# Patient Record
Sex: Male | Born: 1955
Health system: Southern US, Community
[De-identification: ages and names within clinical notes are randomized; demographics above are authoritative.]

## PROBLEM LIST (undated history)

## (undated) DIAGNOSIS — E785 Hyperlipidemia, unspecified: Secondary | ICD-10-CM

## (undated) DIAGNOSIS — I6601 Occlusion and stenosis of right middle cerebral artery: Secondary | ICD-10-CM

## (undated) DIAGNOSIS — I639 Cerebral infarction, unspecified: Secondary | ICD-10-CM

## (undated) DIAGNOSIS — N189 Chronic kidney disease, unspecified: Secondary | ICD-10-CM

## (undated) DIAGNOSIS — I739 Peripheral vascular disease, unspecified: Secondary | ICD-10-CM

## (undated) DIAGNOSIS — Z87442 Personal history of urinary calculi: Secondary | ICD-10-CM

## (undated) DIAGNOSIS — K219 Gastro-esophageal reflux disease without esophagitis: Secondary | ICD-10-CM

## (undated) DIAGNOSIS — I713 Abdominal aortic aneurysm, ruptured, unspecified: Secondary | ICD-10-CM

## (undated) DIAGNOSIS — I712 Thoracic aortic aneurysm, without rupture, unspecified: Secondary | ICD-10-CM

## (undated) DIAGNOSIS — I10 Essential (primary) hypertension: Secondary | ICD-10-CM

## (undated) HISTORY — DX: Thoracic aortic aneurysm, without rupture, unspecified: I71.20

## (undated) HISTORY — DX: Abdominal aortic aneurysm, ruptured, unspecified: I71.30

## (undated) HISTORY — DX: Occlusion and stenosis of right middle cerebral artery: I66.01

## (undated) HISTORY — DX: Hyperlipidemia, unspecified: E78.5

## (undated) HISTORY — DX: Cerebral infarction, unspecified: I63.9

## (undated) HISTORY — DX: Chronic kidney disease, unspecified: N18.9

## (undated) HISTORY — DX: Peripheral vascular disease, unspecified: I73.9

## (undated) HISTORY — PX: CHOLECYSTECTOMY: SHX55

## (undated) HISTORY — DX: Abdominal aortic aneurysm, ruptured: I71.3

## (undated) HISTORY — DX: Thoracic aortic aneurysm, without rupture: I71.2

---

## 2005-03-04 ENCOUNTER — Emergency Department (HOSPITAL_COMMUNITY): Admission: EM | Admit: 2005-03-04 | Discharge: 2005-03-04 | Payer: Self-pay | Admitting: Emergency Medicine

## 2006-06-07 ENCOUNTER — Encounter: Admission: RE | Admit: 2006-06-07 | Discharge: 2006-06-07 | Payer: Self-pay | Admitting: Internal Medicine

## 2007-05-09 ENCOUNTER — Encounter: Admission: RE | Admit: 2007-05-09 | Discharge: 2007-05-09 | Payer: Self-pay | Admitting: Internal Medicine

## 2011-07-11 ENCOUNTER — Emergency Department (HOSPITAL_COMMUNITY)
Admission: EM | Admit: 2011-07-11 | Discharge: 2011-07-11 | Disposition: A | Discharge status: Home / Self Care | Payer: Commercial Managed Care - PPO | Attending: Emergency Medicine | Admitting: Emergency Medicine

## 2011-07-11 ENCOUNTER — Encounter (HOSPITAL_COMMUNITY): Payer: Self-pay

## 2011-07-11 DIAGNOSIS — I10 Essential (primary) hypertension: Secondary | ICD-10-CM | POA: Insufficient documentation

## 2011-07-11 DIAGNOSIS — H81399 Other peripheral vertigo, unspecified ear: Secondary | ICD-10-CM | POA: Insufficient documentation

## 2011-07-11 HISTORY — DX: Essential (primary) hypertension: I10

## 2011-07-11 MED ORDER — MECLIZINE HCL 25 MG PO TABS: 25.0000 mg | ORAL_TABLET | Freq: Three times a day (TID) | ORAL | Status: AC | PRN

## 2011-07-11 MED ORDER — MECLIZINE HCL 25 MG PO TABS
25.0000 mg | ORAL_TABLET | Freq: Once | ORAL | Status: AC
  Administered 2011-07-11: 25 mg via ORAL
  Filled 2011-07-11: qty 1

## 2011-07-11 NOTE — ED Provider Notes (Signed)
History     CSN: [id number]  Arrival date & time 07/11/11  0408   First MD Initiated Contact with Patient 07/11/11 [phone]      Chief Complaint  Patient presents with  . Dizziness    (Consider location/radiation/quality/duration/timing/severity/associated sxs/prior treatment) HPI This is a 56 year old Asian male with a history of vertigo. He was at work this morning and experienced the sudden onset of vertigo at about 2 AM. He describes the vertigo as a sensation of the room spinning. It was accompanied by nausea and vomiting. He states the symptoms were severe. They worsened by movement of his head. The symptoms have subsequently resolved he can now sit up and move his head without any difficulty. He denies chest pain.  Past Medical History  Diagnosis Date  . Hypertension     No past surgical history on file.  No family history on file.  History  Substance Use Topics  . Smoking status: Never Smoker   . Smokeless tobacco: Not on file  . Alcohol Use: No      Review of Systems  All other systems reviewed and are negative.    Allergies  Review of patient's allergies indicates no known allergies.  Home Medications   Current Outpatient Rx  Name Route Sig Dispense Refill  . ACETAMINOPHEN 500 MG PO TABS Oral Take 1,000 mg by mouth every 6 (six) hours as needed. For pain      BP 122/83  Pulse 80  Temp(Src) 97.6 F (36.4 C) (Oral)  Resp 18  SpO2 99%  Physical Exam General: Well-developed, well-nourished male in no acute distress; appearance consistent with age of record HENT: normocephalic, atraumatic Eyes: pupils equal round and reactive to light; extraocular muscles intact; arcus senilis bilaterally Neck: supple Heart: regular rate and rhythm Lungs: clear to auscultation bilaterally Abdomen: soft; nondistended; nontender; bowel sounds present Extremities: No deformity; full range of motion Neurologic: Awake, alert and oriented; motor function intact in all  extremities and symmetric; no facial droop;  Normal coordination; no nystagmus Skin: Warm and dry Psychiatric: Normal mood and affect    ED Course  Procedures (including critical care time)    MDM  EKG Interpretation:  Date & Time: 07/11/2011 4:29 AM  Rate: 71  Rhythm: normal sinus rhythm with PVCs  QRS Axis: normal  Intervals: normal  ST/T Wave abnormalities: normal  Conduction Disutrbances:right bundle branch block  Narrative Interpretation:   Old EKG Reviewed: none available             Hanley Seamen, MD 07/11/11 [phone]

## 2011-07-11 NOTE — Discharge Instructions (Signed)
Vertigo  Vertigo means you feel like you are moving when you are not. Vertigo can make you feel like things around you are moving when they are not. This problem often goes away on its own.   HOME CARE   · Follow your doctor's instructions.  · Avoid driving.  · Avoid using heavy machinery.  · Avoid doing any activity that could be dangerous if you have a vertigo attack.  · Tell your doctor if a medicine seems to cause your vertigo.  GET HELP RIGHT AWAY IF:   · Your medicines do not help or make you feel worse.  · You have trouble talking or walking.  · You feel weak or have trouble using your arms, hands, or legs.  · You have bad headaches.  · You keep feeling sick to your stomach (nauseous) or throwing up (vomiting).  · Your vision changes.  · A family member notices changes in your behavior.  · Your problems get worse.  MAKE SURE YOU:  · Understand these instructions.  · Will watch your condition.  · Will get help right away if you are not doing well or get worse.  Document Released: 12/21/2007 Document Revised: 03/02/2011 Document Reviewed: 09/29/2010  Exit[employer]® Patient Information ©2012 [employer].

## 2011-07-11 NOTE — ED Notes (Signed)
[phone] Mrs. [employer] pt works for a ride back after discharge.

## 2011-07-11 NOTE — ED Notes (Signed)
Pt complains of dizziness and vomiitng onset 2 am. Hx of the same last year.

## 2017-02-27 ENCOUNTER — Ambulatory Visit (HOSPITAL_COMMUNITY)
Admission: EM | Admit: 2017-02-27 | Discharge: 2017-02-27 | Disposition: A | Discharge status: Home / Self Care | Payer: Commercial Managed Care - PPO | Attending: Family Medicine | Admitting: Family Medicine

## 2017-02-27 ENCOUNTER — Encounter (HOSPITAL_COMMUNITY): Payer: Self-pay | Admitting: Family Medicine

## 2017-02-27 ENCOUNTER — Encounter (HOSPITAL_COMMUNITY): Payer: Self-pay

## 2017-02-27 ENCOUNTER — Emergency Department (HOSPITAL_BASED_OUTPATIENT_CLINIC_OR_DEPARTMENT_OTHER)
Admit: 2017-02-27 | Discharge: 2017-02-27 | Disposition: A | Discharge status: Home / Self Care | Payer: Self-pay | Attending: Family Medicine | Admitting: Family Medicine

## 2017-02-27 ENCOUNTER — Emergency Department (HOSPITAL_COMMUNITY)
Admission: EM | Admit: 2017-02-27 | Discharge: 2017-02-27 | Disposition: A | Discharge status: Home / Self Care | Payer: Self-pay | Attending: Emergency Medicine | Admitting: Emergency Medicine

## 2017-02-27 DIAGNOSIS — M79604 Pain in right leg: Secondary | ICD-10-CM

## 2017-02-27 DIAGNOSIS — M79609 Pain in unspecified limb: Secondary | ICD-10-CM

## 2017-02-27 DIAGNOSIS — I10 Essential (primary) hypertension: Secondary | ICD-10-CM | POA: Insufficient documentation

## 2017-02-27 DIAGNOSIS — M79661 Pain in right lower leg: Secondary | ICD-10-CM | POA: Insufficient documentation

## 2017-02-27 LAB — CBC
HCT: 44.2 % (ref 39.0–52.0)
Hemoglobin: 14.8 g/dL (ref 13.0–17.0)
MCH: 26.3 pg (ref 26.0–34.0)
MCHC: 33.5 g/dL (ref 30.0–36.0)
MCV: 78.5 fL (ref 78.0–100.0)
PLATELETS: 305 10*3/uL (ref 150–400)
RBC: 5.63 MIL/uL (ref 4.22–5.81)
RDW: 14.5 % (ref 11.5–15.5)
WBC: 10.7 10*3/uL — ABNORMAL HIGH (ref 4.0–10.5)

## 2017-02-27 LAB — BASIC METABOLIC PANEL
Anion gap: 7 (ref 5–15)
BUN: 16 mg/dL (ref 6–20)
CALCIUM: 8.8 mg/dL — AB (ref 8.9–10.3)
CO2: 26 mmol/L (ref 22–32)
CREATININE: 1.54 mg/dL — AB (ref 0.61–1.24)
Chloride: 104 mmol/L (ref 101–111)
GFR calc Af Amer: 54 mL/min — ABNORMAL LOW (ref >60)
GFR, EST NON AFRICAN AMERICAN: 46 mL/min — AB (ref >60)
Glucose, Bld: 97 mg/dL (ref 65–99)
Potassium: 4.5 mmol/L (ref 3.5–5.1)
SODIUM: 137 mmol/L (ref 135–145)

## 2017-02-27 MED ORDER — CYCLOBENZAPRINE HCL 10 MG PO TABS: 10.0000 mg | ORAL_TABLET | Freq: Two times a day (BID) | ORAL | 0 refills | Status: DC | PRN

## 2017-02-27 NOTE — ED Notes (Signed)
Pt sent to the ER for [place] of right leg.

## 2017-02-27 NOTE — Discharge Instructions (Signed)
We are asking you to go to [facility] Admitting to have a venous doppler exam to rule out a clot.

## 2017-02-27 NOTE — ED Provider Notes (Signed)
  [place]   [id number] 02/27/17 Arrival Time: 1003   SUBJECTIVE:  Thomas [name] is a 61 y.o. male who presents to the urgent care with complaint of right calf pain extending into right thigh since Friday (4 days ago).  Patient works as a Presenter, broadcasting.  He has not had this problem before.  He has had no trauma.  He does smoke.     Past Medical History:  Diagnosis Date  . Hypertension    History reviewed. No pertinent family history. Social History   Socioeconomic History  . Marital status: Divorced    Spouse name: Not on file  . Number of children: Not on file  . Years of education: Not on file  . Highest education level: Not on file  Social Needs  . Financial resource strain: Not on file  . Food insecurity - worry: Not on file  . Food insecurity - inability: Not on file  . Transportation needs - medical: Not on file  . Transportation needs - non-medical: Not on file  Occupational History  . Not on file  Tobacco Use  . Smoking status: Never Smoker  Substance and Sexual Activity  . Alcohol use: No  . Drug use: No  . Sexual activity: Not on file  Other Topics Concern  . Not on file  Social History Narrative  . Not on file   No outpatient medications have been marked as taking for the 02/27/17 encounter [facility] Encounter).   No Known Allergies    ROS: As per HPI, remainder of ROS negative.   OBJECTIVE:   Vitals:   02/27/17 1028  BP: (!) 151/99  Pulse: 74  Resp: 18  Temp: 98.6 F (37 C)  SpO2: 99%     General appearance: alert; no distress Eyes: PERRL; EOMI; conjunctiva normal HENT: normocephalic; atraumatic; , external ears normal without trauma; nasal mucosa normal; oral mucosa normal Neck: supple Back: no CVA tenderness Extremities: no cyanosis or edema; good pedal pulses, symmetrical with no gross deformities, tender calf and tender medial thigh on the right. Skin: warm and dry Neurologic: normal gait; grossly  normal Psychological: alert and cooperative; normal mood and affect      Labs:  No results found for this or any previous visit.  Labs Reviewed - No data to display  No results found.     ASSESSMENT & PLAN:  1. Right leg pain     No orders of the defined types were placed in this encounter.   Reviewed expectations re: course of current medical issues. Questions answered. Outlined signs and symptoms indicating need for more acute intervention. Patient verbalized understanding. After Visit Summary given.       Robyn Haber, MD 02/27/17 1039

## 2017-02-27 NOTE — ED Triage Notes (Signed)
Patient sent from [facility] for DVCT work-up. Complains of right calf pain x 4 days, denies injury, no SOB, NAD

## 2017-02-27 NOTE — Discharge Instructions (Signed)
Please read attached information regarding your condition. Take Flexeril as needed for leg pain/spasms. Follow-up at [facility] for further evaluation. Return to ED for worsening pain, injuries, numbness in legs, chest pain or shortness of breath.

## 2017-02-27 NOTE — Progress Notes (Signed)
*  PRELIMINARY RESULTS* Vascular Ultrasound Right lower extremity venous duplex has been completed.  Preliminary findings: No evidence of deep vein thrombosis or baker's cysts in the right lower extremity.  Nothing seen posterior right calf, area of pain.   Thomas [name] 02/27/2017, 12:01 PM

## 2017-02-27 NOTE — ED Provider Notes (Signed)
[facility] EMERGENCY DEPARTMENT Provider Note   CSN: [id number] Arrival date & time: 02/27/17  1053     History   Chief Complaint Chief Complaint  Patient presents with  . leg pain/ r/o DVT    HPI Thomas [name] is a 61 y.o. male with a past medical history of HTN, who presents to ED for evaluation of R calf pain for the past 4 days. He describes it as an aching and cramping pain, worse with ambulation. He has tried over the counter muscle rubs and Tylenol with mild relief in symptoms.  He reports intermittent pain like this in the past and bilateral legs but he is concerned because this has lasted longer than usual.  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  He states he does not get much physical exercise other than that.  He denies any chest pain, shortness of breath, hemoptysis, prior DVT, PE, urinary symptoms, abdominal pain, vomiting, falls or injuries.  He denies any previous fracture, dislocations or procedures in the area.  HPI  Past Medical History:  Diagnosis Date  . Hypertension     There are no active problems to display for this patient.   History reviewed. No pertinent surgical history.     Home Medications    Prior to Admission medications   Medication Sig Start Date End Date Taking? Authorizing Provider  acetaminophen (TYLENOL) 500 MG tablet Take 1,000 mg by mouth every 6 (six) hours as needed. For pain    [provider]  cyclobenzaprine (FLEXERIL) 10 MG tablet Take 1 tablet (10 mg total) by mouth 2 (two) times daily as needed for muscle spasms. 02/27/17   Delia Heady, PA-C    Family History No family history on file.  Social History Social History   Tobacco Use  . Smoking status: Never Smoker  Substance Use Topics  . Alcohol use: No  . Drug use: No     Allergies   Patient has no known allergies.   Review of Systems Review of Systems  Constitutional: Negative for appetite change, chills and  fever.  HENT: Negative for ear pain, rhinorrhea, sneezing and sore throat.   Eyes: Negative for photophobia and visual disturbance.  Respiratory: Negative for cough, chest tightness, shortness of breath and wheezing.   Cardiovascular: Negative for chest pain and palpitations.  Gastrointestinal: Negative for abdominal pain, blood in stool, constipation, diarrhea, nausea and vomiting.  Genitourinary: Negative for dysuria, hematuria and urgency.  Musculoskeletal: Positive for arthralgias, gait problem and myalgias.  Skin: Negative for rash.  Neurological: Negative for dizziness, weakness and light-headedness.     Physical Exam Updated Vital Signs BP (!) 185/95 (BP Location: Right Arm)   Pulse 73   Temp (!) 97.5 F (36.4 C) (Oral)   Resp 16   SpO2 98%   Physical Exam  Constitutional: He appears well-developed and well-nourished. No distress.  Nontoxic appearing and in no acute distress.  HENT:  Head: Normocephalic and atraumatic.  Nose: Nose normal.  Eyes: Conjunctivae and EOM are normal. Left eye exhibits no discharge. No scleral icterus.  Neck: Normal range of motion. Neck supple.  Cardiovascular: Normal rate, regular rhythm, normal heart sounds and intact distal pulses. Exam reveals no gallop and no friction rub.  No murmur heard. Pulmonary/Chest: Effort normal and breath sounds normal. No respiratory distress.  Abdominal: Soft. Bowel sounds are normal. He exhibits no distension. There is no tenderness. There is no guarding.  Musculoskeletal: Normal range  of motion. He exhibits tenderness. He exhibits no edema.  Calf tenderness with palpation.  No erythema, swelling or temperature change noted.  Full active and passive range of motion of ankle and knees bilaterally.  2+ DP pulses noted bilaterally although difficult to find on the right side.  Sensation intact to light touch.  Neurological: He is alert. He exhibits normal muscle tone. Coordination normal.  Skin: Skin is warm and  dry. No rash noted.  Psychiatric: He has a normal mood and affect.  Nursing note and vitals reviewed.    ED Treatments / Results  Labs (all labs ordered are listed, but only abnormal results are displayed) Labs Reviewed  CBC - Abnormal; Notable for the following components:      Result Value   WBC 10.7 (*)    All other components within normal limits  BASIC METABOLIC PANEL - Abnormal; Notable for the following components:   Creatinine, Ser 1.54 (*)    Calcium 8.8 (*)    GFR calc non Af Amer 46 (*)    GFR calc Af Amer 54 (*)    All other components within normal limits    EKG  EKG Interpretation None       Radiology No results found.   *PRELIMINARY RESULTS* Vascular Ultrasound Right lower extremity venous duplex has been completed.  Preliminary findings: No evidence of deep vein thrombosis or baker's cysts in the right lower extremity.  Nothing seen posterior right calf, area of pain.   Everrett Coombe 02/27/2017, 12:01 PM    Procedures Procedures (including critical care time)  Medications Ordered in ED Medications - No data to display   Initial Impression / Assessment and Plan / ED Course  I have reviewed the triage vital signs and the nursing notes.  Pertinent labs & imaging results that were available during my care of the patient were reviewed by me and considered in my medical decision making (see chart for details).     Patient presents to ED for evaluation of right-sided calf pain for the past 4 days.  This is been an intermittent issue with in both legs for the past several years.  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  He denies any chest pain, shortness of breath, hemoptysis, leg swelling, recent travel, recent surgeries, history of cancer, prior DVT or PE.  He does have right-sided calf tenderness to palpation but no visible erythema, edema or temperature change of joints.  He is ambulatory here in the ED  with normal gait.  He has full active and passive range of motion of knees and ankles.  He denies any injury or falls.  Ultrasound of the right lower extremity to rule out DVT was for abnormalities.  I suspect that his symptoms are due to overuse and possibly muscle cramps and spasms.  He does appear to have some extent of kidney disease with recent lab work.  I informed patient of this and stated that it could be a possibility that he would need to be admitted for further evaluation to prevent any serious or life-threatening complications such as dialysis or death.  His blood pressure is also elevated here and he denies any antihypertensive use.  I did educate the patient on risks and benefits of leaving and he declines admission at this time.  He states that "I just want something to help with my leg pain, I know when there is something wrong with my body  and right now I feel great."  Patient states that he is able to understand everything that I am saying he would not like interpreter.  We will give patient muscle relaxer as well as encouraging topical analgesics and Tylenol to be taken as needed.  I did stress the importance of him returning for any severe or worsening symptoms.  Patient appears stable for discharge at this time.  Strict return precautions given.  Final Clinical Impressions(s) / ED Diagnoses   Final diagnoses:  Right calf pain    ED Discharge Orders        Ordered    cyclobenzaprine (FLEXERIL) 10 MG tablet  2 times daily PRN     02/27/17 1442       Delia Heady, PA-C 02/27/17 1512    Daleen Bo, MD 03/01/17 [phone]

## 2017-02-27 NOTE — ED Triage Notes (Signed)
Pt here for right calf pain for almost a week. Denise injury.

## 2017-06-25 DIAGNOSIS — I639 Cerebral infarction, unspecified: Secondary | ICD-10-CM

## 2017-06-25 HISTORY — DX: Cerebral infarction, unspecified: I63.9

## 2017-06-28 ENCOUNTER — Ambulatory Visit (INDEPENDENT_AMBULATORY_CARE_PROVIDER_SITE_OTHER): Payer: Medicare HMO | Admitting: Physician Assistant

## 2017-06-28 ENCOUNTER — Encounter: Payer: Self-pay | Admitting: Physician Assistant

## 2017-06-28 ENCOUNTER — Encounter (HOSPITAL_COMMUNITY): Payer: Self-pay

## 2017-06-28 ENCOUNTER — Encounter (HOSPITAL_COMMUNITY): Payer: Self-pay | Admitting: *Deleted

## 2017-06-28 ENCOUNTER — Encounter: Payer: Self-pay | Admitting: Urgent Care

## 2017-06-28 ENCOUNTER — Ambulatory Visit (INDEPENDENT_AMBULATORY_CARE_PROVIDER_SITE_OTHER): Payer: Medicare HMO

## 2017-06-28 ENCOUNTER — Inpatient Hospital Stay (HOSPITAL_COMMUNITY): Payer: Private Health Insurance - Indemnity

## 2017-06-28 ENCOUNTER — Inpatient Hospital Stay (HOSPITAL_COMMUNITY)
Admission: EM | Admit: 2017-06-28 | Discharge: 2017-07-14 | DRG: 268 | Disposition: A | Discharge status: Home Health | Payer: Private Health Insurance - Indemnity | Attending: Internal Medicine | Admitting: Internal Medicine

## 2017-06-28 VITALS — BP 100/64 | Temp 98.7°F

## 2017-06-28 DIAGNOSIS — R29700 NIHSS score 0: Secondary | ICD-10-CM | POA: Diagnosis present

## 2017-06-28 DIAGNOSIS — R079 Chest pain, unspecified: Secondary | ICD-10-CM

## 2017-06-28 DIAGNOSIS — I330 Acute and subacute infective endocarditis: Secondary | ICD-10-CM | POA: Diagnosis not present

## 2017-06-28 DIAGNOSIS — J069 Acute upper respiratory infection, unspecified: Secondary | ICD-10-CM

## 2017-06-28 DIAGNOSIS — I713 Abdominal aortic aneurysm, ruptured, unspecified: Secondary | ICD-10-CM

## 2017-06-28 DIAGNOSIS — I712 Thoracic aortic aneurysm, without rupture: Secondary | ICD-10-CM | POA: Diagnosis present

## 2017-06-28 DIAGNOSIS — E278 Other specified disorders of adrenal gland: Secondary | ICD-10-CM | POA: Diagnosis present

## 2017-06-28 DIAGNOSIS — R58 Hemorrhage, not elsewhere classified: Secondary | ICD-10-CM | POA: Diagnosis not present

## 2017-06-28 DIAGNOSIS — J154 Pneumonia due to other streptococci: Secondary | ICD-10-CM | POA: Diagnosis not present

## 2017-06-28 DIAGNOSIS — R42 Dizziness and giddiness: Secondary | ICD-10-CM

## 2017-06-28 DIAGNOSIS — D72829 Elevated white blood cell count, unspecified: Secondary | ICD-10-CM

## 2017-06-28 DIAGNOSIS — N179 Acute kidney failure, unspecified: Secondary | ICD-10-CM | POA: Diagnosis present

## 2017-06-28 DIAGNOSIS — Z8673 Personal history of transient ischemic attack (TIA), and cerebral infarction without residual deficits: Secondary | ICD-10-CM

## 2017-06-28 DIAGNOSIS — I711 Thoracic aortic aneurysm, ruptured: Secondary | ICD-10-CM | POA: Diagnosis not present

## 2017-06-28 DIAGNOSIS — R7881 Bacteremia: Secondary | ICD-10-CM

## 2017-06-28 DIAGNOSIS — J9601 Acute respiratory failure with hypoxia: Secondary | ICD-10-CM | POA: Diagnosis not present

## 2017-06-28 DIAGNOSIS — B955 Unspecified streptococcus as the cause of diseases classified elsewhere: Secondary | ICD-10-CM | POA: Diagnosis present

## 2017-06-28 DIAGNOSIS — J849 Interstitial pulmonary disease, unspecified: Secondary | ICD-10-CM | POA: Diagnosis present

## 2017-06-28 DIAGNOSIS — R578 Other shock: Secondary | ICD-10-CM | POA: Diagnosis not present

## 2017-06-28 DIAGNOSIS — G939 Disorder of brain, unspecified: Secondary | ICD-10-CM | POA: Diagnosis not present

## 2017-06-28 DIAGNOSIS — R072 Precordial pain: Secondary | ICD-10-CM

## 2017-06-28 DIAGNOSIS — E860 Dehydration: Secondary | ICD-10-CM | POA: Diagnosis not present

## 2017-06-28 DIAGNOSIS — R05 Cough: Secondary | ICD-10-CM

## 2017-06-28 DIAGNOSIS — R1032 Left lower quadrant pain: Secondary | ICD-10-CM | POA: Diagnosis not present

## 2017-06-28 DIAGNOSIS — I76 Septic arterial embolism: Secondary | ICD-10-CM | POA: Diagnosis present

## 2017-06-28 DIAGNOSIS — Z8679 Personal history of other diseases of the circulatory system: Secondary | ICD-10-CM | POA: Diagnosis not present

## 2017-06-28 DIAGNOSIS — I63342 Cerebral infarction due to thrombosis of left cerebellar artery: Secondary | ICD-10-CM

## 2017-06-28 DIAGNOSIS — I301 Infective pericarditis: Secondary | ICD-10-CM

## 2017-06-28 DIAGNOSIS — E875 Hyperkalemia: Secondary | ICD-10-CM | POA: Diagnosis not present

## 2017-06-28 DIAGNOSIS — I010 Acute rheumatic pericarditis: Secondary | ICD-10-CM | POA: Diagnosis not present

## 2017-06-28 DIAGNOSIS — I959 Hypotension, unspecified: Secondary | ICD-10-CM | POA: Diagnosis not present

## 2017-06-28 DIAGNOSIS — R6521 Severe sepsis with septic shock: Secondary | ICD-10-CM | POA: Diagnosis not present

## 2017-06-28 DIAGNOSIS — A403 Sepsis due to Streptococcus pneumoniae: Secondary | ICD-10-CM | POA: Diagnosis present

## 2017-06-28 DIAGNOSIS — I4510 Unspecified right bundle-branch block: Secondary | ICD-10-CM | POA: Diagnosis present

## 2017-06-28 DIAGNOSIS — J189 Pneumonia, unspecified organism: Secondary | ICD-10-CM | POA: Diagnosis present

## 2017-06-28 DIAGNOSIS — I9589 Other hypotension: Secondary | ICD-10-CM | POA: Diagnosis not present

## 2017-06-28 DIAGNOSIS — N183 Chronic kidney disease, stage 3 (moderate): Secondary | ICD-10-CM | POA: Diagnosis not present

## 2017-06-28 DIAGNOSIS — I6340 Cerebral infarction due to embolism of unspecified cerebral artery: Secondary | ICD-10-CM | POA: Diagnosis not present

## 2017-06-28 DIAGNOSIS — E876 Hypokalemia: Secondary | ICD-10-CM | POA: Diagnosis present

## 2017-06-28 DIAGNOSIS — I70203 Unspecified atherosclerosis of native arteries of extremities, bilateral legs: Secondary | ICD-10-CM | POA: Diagnosis present

## 2017-06-28 DIAGNOSIS — E785 Hyperlipidemia, unspecified: Secondary | ICD-10-CM | POA: Diagnosis present

## 2017-06-28 DIAGNOSIS — I701 Atherosclerosis of renal artery: Secondary | ICD-10-CM | POA: Diagnosis present

## 2017-06-28 DIAGNOSIS — F17210 Nicotine dependence, cigarettes, uncomplicated: Secondary | ICD-10-CM | POA: Diagnosis not present

## 2017-06-28 DIAGNOSIS — R059 Cough, unspecified: Secondary | ICD-10-CM

## 2017-06-28 DIAGNOSIS — Z9889 Other specified postprocedural states: Secondary | ICD-10-CM

## 2017-06-28 DIAGNOSIS — R9431 Abnormal electrocardiogram [ECG] [EKG]: Secondary | ICD-10-CM | POA: Diagnosis not present

## 2017-06-28 DIAGNOSIS — E872 Acidosis: Secondary | ICD-10-CM | POA: Diagnosis present

## 2017-06-28 DIAGNOSIS — K661 Hemoperitoneum: Secondary | ICD-10-CM | POA: Diagnosis present

## 2017-06-28 DIAGNOSIS — I639 Cerebral infarction, unspecified: Secondary | ICD-10-CM | POA: Diagnosis present

## 2017-06-28 DIAGNOSIS — I718 Aortic aneurysm of unspecified site, ruptured: Secondary | ICD-10-CM | POA: Diagnosis not present

## 2017-06-28 DIAGNOSIS — J9690 Respiratory failure, unspecified, unspecified whether with hypoxia or hypercapnia: Secondary | ICD-10-CM

## 2017-06-28 DIAGNOSIS — I309 Acute pericarditis, unspecified: Secondary | ICD-10-CM | POA: Diagnosis not present

## 2017-06-28 DIAGNOSIS — R531 Weakness: Secondary | ICD-10-CM | POA: Diagnosis present

## 2017-06-28 DIAGNOSIS — R109 Unspecified abdominal pain: Secondary | ICD-10-CM | POA: Diagnosis not present

## 2017-06-28 DIAGNOSIS — I63443 Cerebral infarction due to embolism of bilateral cerebellar arteries: Secondary | ICD-10-CM | POA: Diagnosis not present

## 2017-06-28 DIAGNOSIS — I319 Disease of pericardium, unspecified: Secondary | ICD-10-CM | POA: Diagnosis not present

## 2017-06-28 DIAGNOSIS — Z9911 Dependence on respirator [ventilator] status: Secondary | ICD-10-CM | POA: Diagnosis not present

## 2017-06-28 DIAGNOSIS — D62 Acute posthemorrhagic anemia: Secondary | ICD-10-CM | POA: Diagnosis not present

## 2017-06-28 DIAGNOSIS — A419 Sepsis, unspecified organism: Secondary | ICD-10-CM | POA: Diagnosis not present

## 2017-06-28 DIAGNOSIS — B953 Streptococcus pneumoniae as the cause of diseases classified elsewhere: Secondary | ICD-10-CM | POA: Diagnosis not present

## 2017-06-28 DIAGNOSIS — I129 Hypertensive chronic kidney disease with stage 1 through stage 4 chronic kidney disease, or unspecified chronic kidney disease: Secondary | ICD-10-CM | POA: Diagnosis present

## 2017-06-28 DIAGNOSIS — I6300 Cerebral infarction due to thrombosis of unspecified precerebral artery: Secondary | ICD-10-CM | POA: Diagnosis not present

## 2017-06-28 DIAGNOSIS — K5900 Constipation, unspecified: Secondary | ICD-10-CM | POA: Diagnosis not present

## 2017-06-28 DIAGNOSIS — R748 Abnormal levels of other serum enzymes: Secondary | ICD-10-CM | POA: Diagnosis not present

## 2017-06-28 DIAGNOSIS — R778 Other specified abnormalities of plasma proteins: Secondary | ICD-10-CM

## 2017-06-28 DIAGNOSIS — R7989 Other specified abnormal findings of blood chemistry: Secondary | ICD-10-CM

## 2017-06-28 LAB — CBG MONITORING, ED: GLUCOSE-CAPILLARY: 115 mg/dL — AB (ref 65–99)

## 2017-06-28 LAB — POCT CBC
Granulocyte percent: 94.1 %G — AB (ref 37–80)
HEMATOCRIT: 44.4 % (ref 43.5–53.7)
Hemoglobin: 14.5 g/dL (ref 14.1–18.1)
Lymph, poc: 1 (ref 0.6–3.4)
MCH, POC: 25.5 pg — AB (ref 27–31.2)
MCHC: 32.8 g/dL (ref 31.8–35.4)
MCV: 77.8 fL — AB (ref 80–97)
MID (CBC): 0.3 (ref 0–0.9)
MPV: 6.4 fL (ref 0–99.8)
POC GRANULOCYTE: 20.5 — AB (ref 2–6.9)
POC LYMPH PERCENT: 4.5 %L — AB (ref 10–50)
POC MID %: 1.4 % (ref 0–12)
Platelet Count, POC: 270 10*3/uL (ref 142–424)
RBC: 5.7 M/uL (ref 4.69–6.13)
RDW, POC: 14.4 %
WBC: 21.8 10*3/uL — AB (ref 4.6–10.2)

## 2017-06-28 LAB — URINALYSIS, ROUTINE W REFLEX MICROSCOPIC
BACTERIA UA: NONE SEEN
BILIRUBIN URINE: NEGATIVE
Glucose, UA: NEGATIVE mg/dL
KETONES UR: NEGATIVE mg/dL
Leukocytes, UA: NEGATIVE
Nitrite: NEGATIVE
PROTEIN: 30 mg/dL — AB
Specific Gravity, Urine: 1.013 (ref 1.005–1.030)
Squamous Epithelial / LPF: NONE SEEN
WBC UA: NONE SEEN WBC/hpf (ref 0–5)
pH: 7 (ref 5.0–8.0)

## 2017-06-28 LAB — DIFFERENTIAL
BASOS ABS: 0 10*3/uL (ref 0.0–0.1)
BASOS PCT: 0 %
EOS ABS: 0 10*3/uL (ref 0.0–0.7)
Eosinophils Relative: 0 %
LYMPHS ABS: 0.7 10*3/uL (ref 0.7–4.0)
Lymphocytes Relative: 3 %
MONOS PCT: 2 %
Monocytes Absolute: 0.5 10*3/uL (ref 0.1–1.0)
Neutro Abs: 22.9 10*3/uL — ABNORMAL HIGH (ref 1.7–7.7)
Neutrophils Relative %: 95 %

## 2017-06-28 LAB — I-STAT TROPONIN, ED: Troponin i, poc: 0.33 ng/mL (ref 0.00–0.08)

## 2017-06-28 LAB — MAGNESIUM
MAGNESIUM: 1.5 mg/dL — AB (ref 1.7–2.4)
Magnesium: 1.4 mg/dL — ABNORMAL LOW (ref 1.7–2.4)

## 2017-06-28 LAB — POCT URINALYSIS DIP (MANUAL ENTRY)
BILIRUBIN UA: NEGATIVE
Glucose, UA: NEGATIVE mg/dL
LEUKOCYTES UA: NEGATIVE
NITRITE UA: NEGATIVE
PH UA: 6 (ref 5.0–8.0)
Spec Grav, UA: 1.025 (ref 1.010–1.025)
Urobilinogen, UA: 2 E.U./dL — AB

## 2017-06-28 LAB — BASIC METABOLIC PANEL
ANION GAP: 8 (ref 5–15)
BUN: 25 mg/dL — ABNORMAL HIGH (ref 6–20)
CO2: 23 mmol/L (ref 22–32)
Calcium: 8.4 mg/dL — ABNORMAL LOW (ref 8.9–10.3)
Chloride: 106 mmol/L (ref 101–111)
Creatinine, Ser: 1.74 mg/dL — ABNORMAL HIGH (ref 0.61–1.24)
GFR, EST AFRICAN AMERICAN: 46 mL/min — AB (ref >60)
GFR, EST NON AFRICAN AMERICAN: 40 mL/min — AB (ref >60)
GLUCOSE: 107 mg/dL — AB (ref 65–99)
POTASSIUM: 3.8 mmol/L (ref 3.5–5.1)
Sodium: 137 mmol/L (ref 135–145)

## 2017-06-28 LAB — CBC
HEMATOCRIT: 40.2 % (ref 39.0–52.0)
HEMOGLOBIN: 13.6 g/dL (ref 13.0–17.0)
MCH: 26.4 pg (ref 26.0–34.0)
MCHC: 33.8 g/dL (ref 30.0–36.0)
MCV: 78.1 fL (ref 78.0–100.0)
Platelets: 226 10*3/uL (ref 150–400)
RBC: 5.15 MIL/uL (ref 4.22–5.81)
RDW: 14.2 % (ref 11.5–15.5)
WBC: 24.1 10*3/uL — ABNORMAL HIGH (ref 4.0–10.5)

## 2017-06-28 LAB — CREATININE, URINE, RANDOM: Creatinine, Urine: 100.46 mg/dL

## 2017-06-28 LAB — SODIUM, URINE, RANDOM: Sodium, Ur: 89 mmol/L

## 2017-06-28 LAB — TROPONIN I
Troponin I: 0.34 ng/mL (ref <0.03)
Troponin I: 0.45 ng/mL (ref <0.03)

## 2017-06-28 LAB — POCT INFLUENZA A/B
Influenza A, POC: NEGATIVE
Influenza B, POC: NEGATIVE

## 2017-06-28 LAB — GLUCOSE, POCT (MANUAL RESULT ENTRY): POC GLUCOSE: 109 mg/dL — AB (ref 70–99)

## 2017-06-28 LAB — STREP PNEUMONIAE URINARY ANTIGEN: STREP PNEUMO URINARY ANTIGEN: POSITIVE — AB

## 2017-06-28 LAB — TSH: TSH: 0.471 u[IU]/mL (ref 0.350–4.500)

## 2017-06-28 MED ORDER — MECLIZINE HCL 12.5 MG PO TABS: 25.0000 mg | ORAL_TABLET | Freq: Three times a day (TID) | ORAL | Status: DC | PRN

## 2017-06-28 MED ORDER — ZOLPIDEM TARTRATE 5 MG PO TABS: 5.0000 mg | ORAL_TABLET | Freq: Every evening | ORAL | Status: DC | PRN

## 2017-06-28 MED ORDER — ASPIRIN 81 MG PO CHEW
324.0000 mg | CHEWABLE_TABLET | Freq: Once | ORAL | Status: AC
  Administered 2017-06-28: 324 mg via ORAL
  Filled 2017-06-28: qty 4

## 2017-06-28 MED ORDER — SODIUM CHLORIDE 0.9 % IV SOLN
1.0000 g | INTRAVENOUS | Status: DC
  Administered 2017-06-29: 1 g via INTRAVENOUS
  Filled 2017-06-28: qty 10
  Filled 2017-06-28: qty 1

## 2017-06-28 MED ORDER — HEPARIN SODIUM (PORCINE) 5000 UNIT/ML IJ SOLN: 5000.0000 [IU] | Freq: Three times a day (TID) | INTRAMUSCULAR | Status: DC

## 2017-06-28 MED ORDER — NITROGLYCERIN 0.4 MG SL SUBL: 0.4000 mg | SUBLINGUAL_TABLET | SUBLINGUAL | Status: DC | PRN

## 2017-06-28 MED ORDER — ACETAMINOPHEN 325 MG PO TABS: 650.0000 mg | ORAL_TABLET | Freq: Four times a day (QID) | ORAL | Status: DC | PRN

## 2017-06-28 MED ORDER — SODIUM CHLORIDE 0.9 % IV SOLN
INTRAVENOUS | Status: DC
  Administered 2017-06-28 – 2017-06-30 (×4): via INTRAVENOUS

## 2017-06-28 MED ORDER — OXYCODONE HCL 5 MG PO TABS
5.0000 mg | ORAL_TABLET | ORAL | Status: DC | PRN
  Administered 2017-06-30 – 2017-07-01 (×4): 5 mg via ORAL
  Filled 2017-06-28 (×4): qty 1

## 2017-06-28 MED ORDER — SODIUM CHLORIDE 0.9 % IV BOLUS
1000.0000 mL | Freq: Once | INTRAVENOUS | Status: AC
  Administered 2017-06-28: 1000 mL via INTRAVENOUS

## 2017-06-28 MED ORDER — ACETAMINOPHEN 650 MG RE SUPP: 650.0000 mg | Freq: Four times a day (QID) | RECTAL | Status: DC | PRN

## 2017-06-28 MED ORDER — SORBITOL 70 % SOLN
30.0000 mL | Freq: Every day | Status: DC | PRN
  Filled 2017-06-28: qty 30

## 2017-06-28 MED ORDER — POLYETHYLENE GLYCOL 3350 17 G PO PACK: 17.0000 g | PACK | Freq: Every day | ORAL | Status: DC | PRN

## 2017-06-28 MED ORDER — AZITHROMYCIN 500 MG IV SOLR
500.0000 mg | INTRAVENOUS | Status: DC
  Administered 2017-06-29: 500 mg via INTRAVENOUS
  Filled 2017-06-28: qty 500

## 2017-06-28 MED ORDER — HEPARIN SODIUM (PORCINE) 5000 UNIT/ML IJ SOLN
5000.0000 [IU] | Freq: Three times a day (TID) | INTRAMUSCULAR | Status: DC
  Administered 2017-06-28 – 2017-07-01 (×9): 5000 [IU] via SUBCUTANEOUS
  Filled 2017-06-28 (×9): qty 1

## 2017-06-28 MED ORDER — KETOROLAC TROMETHAMINE 15 MG/ML IJ SOLN
15.0000 mg | Freq: Four times a day (QID) | INTRAMUSCULAR | Status: DC | PRN
  Administered 2017-06-30 – 2017-07-01 (×3): 15 mg via INTRAVENOUS
  Filled 2017-06-28 (×3): qty 1

## 2017-06-28 MED ORDER — MAGNESIUM CITRATE PO SOLN: 1.0000 | Freq: Once | ORAL | Status: DC | PRN

## 2017-06-28 MED ORDER — ASPIRIN EC 81 MG PO TBEC
81.0000 mg | DELAYED_RELEASE_TABLET | Freq: Every day | ORAL | Status: DC
  Administered 2017-06-29 – 2017-07-01 (×3): 81 mg via ORAL
  Filled 2017-06-28 (×4): qty 1

## 2017-06-28 MED ORDER — SENNA 8.6 MG PO TABS
1.0000 | ORAL_TABLET | Freq: Two times a day (BID) | ORAL | Status: DC
  Administered 2017-06-28 – 2017-07-01 (×6): 8.6 mg via ORAL
  Filled 2017-06-28 (×7): qty 1

## 2017-06-28 MED ORDER — ACETAMINOPHEN 325 MG PO TABS
650.0000 mg | ORAL_TABLET | ORAL | Status: DC | PRN
  Administered 2017-06-28 – 2017-07-01 (×7): 650 mg via ORAL
  Filled 2017-06-28 (×7): qty 2

## 2017-06-28 MED ORDER — MAGNESIUM SULFATE 4 GM/100ML IV SOLN
4.0000 g | Freq: Once | INTRAVENOUS | Status: AC
  Administered 2017-06-28: 4 g via INTRAVENOUS
  Filled 2017-06-28: qty 100

## 2017-06-28 MED ORDER — ONDANSETRON HCL 4 MG/2ML IJ SOLN
4.0000 mg | Freq: Four times a day (QID) | INTRAMUSCULAR | Status: DC | PRN
  Administered 2017-07-08: 4 mg via INTRAVENOUS
  Filled 2017-06-28: qty 2

## 2017-06-28 NOTE — ED Triage Notes (Signed)
Pt arrived via EMS from Urgent care/[facility]. Pt reported  c/o weak chills, and chest burning that began yesterday. Pt denies chest  pain and SOB upon arrival per EMS. Per Facility pt was orthostatic positive. Pt does reports Dizziness x a few years. Pt does report ear pain that has been chronic.Pt was given 1800cc of normal saline. Pt is suspected for Pneumonia.        EMS v/s HR 94 BP 116/60, RR 18 CBG 106

## 2017-06-28 NOTE — ED Notes (Signed)
Pt going to MRI , RN on 4th floor made aware.

## 2017-06-28 NOTE — ED Notes (Signed)
Bed: YQ03 Expected date:  Expected time:  Means of arrival:  Comments: EMS-dizziness

## 2017-06-28 NOTE — ED Notes (Signed)
Pt aware that a urine sample is needed. Pt sts that he provided one at urgent care and went to the bathroom while the provider was in the room recently, therefore sts he is unable to provide a sample at this time.  RN notified.

## 2017-06-28 NOTE — Progress Notes (Signed)
p 

## 2017-06-28 NOTE — Progress Notes (Signed)
PRIMARY CARE AT [facility] [address] [place] [address] [phone]  Date:  06/28/2017   Name:  Thomas [name]   DOB:  [birth year]   MRN:  [id number]  PCP:  Patient, No Pcp Per    History of Present Illness:  Thomas [name] is a 62 y.o. male patient who presents to PCP with No chief complaint on file.    Patient is here today for 1 day of chest pain, sob and dizziness. He reports several days of coughing, and fatigue.  He reports feeling lightheaded with movement.  Chest pain is at left breast.  No palpitations.  He did not report taking anything for his symptoms.    There are no active problems to display for this patient.   Past Medical History:  Diagnosis Date  . Hypertension     History reviewed. No pertinent surgical history.  Social History   Tobacco Use  . Smoking status: Never Smoker  . Smokeless tobacco: Never Used  Substance Use Topics  . Alcohol use: No  . Drug use: No    History reviewed. No pertinent family history.  No Known Allergies  Medication list has been reviewed and updated.  Current Outpatient Medications on File Prior to Visit  Medication Sig Dispense Refill  . acetaminophen (TYLENOL) 500 MG tablet Take 1,000 mg by mouth every 6 (six) hours as needed. For pain    . cyclobenzaprine (FLEXERIL) 10 MG tablet Take 1 tablet (10 mg total) by mouth 2 (two) times daily as needed for muscle spasms. 20 tablet 0   No current facility-administered medications on file prior to visit.     ROS ROS otherwise unremarkable unless listed above.  Physical Examination: BP 100/64 (BP Location: Right Arm, Patient Position: Prone, Cuff Size: Normal)   Temp 98.7 F (37.1 C) (Oral) 96%,  Ideal Body Weight:    Physical Exam  Constitutional: He is oriented to person, place, and time. He appears well-developed and well-nourished. No distress.  HENT:  Head: Normocephalic and atraumatic.  Right Ear: Tympanic membrane, external ear and ear canal normal.  Left Ear:  Tympanic membrane, external ear and ear canal normal.  Nose: Mucosal edema and rhinorrhea present. Right sinus exhibits no maxillary sinus tenderness and no frontal sinus tenderness. Left sinus exhibits no maxillary sinus tenderness and no frontal sinus tenderness.  Mouth/Throat: No uvula swelling. No oropharyngeal exudate, posterior oropharyngeal edema or posterior oropharyngeal erythema.  Eyes: Pupils are equal, round, and reactive to light. Conjunctivae, EOM and lids are normal. Right eye exhibits normal extraocular motion. Left eye exhibits normal extraocular motion.  Neck: Trachea normal and full passive range of motion without pain. No edema and no erythema present.  Cardiovascular: Regular rhythm and normal heart sounds. Tachycardia present.  Pulses:      Radial pulses are 2+ on the right side, and 2+ on the left side.  Pulmonary/Chest: Effort normal. No respiratory distress. He has no decreased breath sounds. He has no wheezes. He has no rhonchi.  Neurological: He is alert and oriented to person, place, and time.  Skin: Skin is warm and dry. He is not diaphoretic.  Psychiatric: He has a normal mood and affect. His behavior is normal.    Results for orders placed or performed in visit on 06/28/17  POCT CBC  Result Value Ref Range   WBC 21.8 (A) 4.6 - 10.2 K/uL   Lymph, poc 1.0 0.6 - 3.4   POC LYMPH PERCENT 4.5 (A) 10 - 50 %L  MID (cbc) 0.3 0 - 0.9   POC MID % 1.4 0 - 12 %M   POC Granulocyte 20.5 (A) 2 - 6.9   Granulocyte percent 94.1 (A) 37 - 80 %G   RBC 5.70 4.69 - 6.13 M/uL   Hemoglobin 14.5 14.1 - 18.1 g/dL   HCT, POC 44.4 43.5 - 53.7 %   MCV 77.8 (A) 80 - 97 fL   MCH, POC 25.5 (A) 27 - 31.2 pg   MCHC 32.8 31.8 - 35.4 g/dL   RDW, POC 14.4 %   Platelet Count, POC 270 142 - 424 K/uL   MPV 6.4 0 - 99.8 fL  POCT Influenza A/B  Result Value Ref Range   Influenza A, POC Negative Negative   Influenza B, POC Negative Negative  POCT glucose (manual entry)  Result Value Ref  Range   POC Glucose 109 (A) 70 - 99 mg/dl  POCT urinalysis dipstick  Result Value Ref Range   Color, UA yellow yellow   Clarity, UA clear clear   Glucose, UA negative negative mg/dL   Bilirubin, UA negative negative   Ketones, POC UA trace (5) (A) negative mg/dL   Spec Grav, UA 1.025 1.010 - 1.025   Blood, UA trace-lysed (A) negative   pH, UA 6.0 5.0 - 8.0   Protein Ur, POC =100 (A) negative mg/dL   Urobilinogen, UA 2.0 (A) 0.2 or 1.0 E.U./dL   Nitrite, UA Negative Negative   Leukocytes, UA Negative Negative   Dg Chest 2 View  Result Date: 06/28/2017 CLINICAL DATA:  Chest pain. EXAM: CHEST - 2 VIEW COMPARISON:  No prior. FINDINGS: Thoracic aorta is tortuous. Heart size normal. Diffuse bilateral pulmonary interstitial prominence noted. Although an active interstitial process may be present changes may be secondary to chronic interstitial lung disease. A component of bronchiectasis most likely present in the lung bases. No focal alveolar infiltrate. Biapical and left-sided pleural thickening noted most likely secondary to scarring. Small left pleural effusion may be present. IMPRESSION: 1. Interstitial lung disease possibly chronic. A component of basilar bronchiectasis most likely present in the lung bases. Pleural scarring. 2. Tortuous thoracic aorta. Heart size normal. No pulmonary venous congestion. Electronically Signed   By: Marcello Moores  Register   On: 06/28/2017 13:55   EKG: normal EKG, tachycardia, nonspecific ST and T waves changes. RBB.  Assessment and Plan: Thomas [name] is a 62 y.o. male who is here today for cc of chest pain, sob, dizziness.   Possible early pneumonia.  Concern of ARF impending.  Orthostatic and unable to ambulate without assistance.  I am transmitting him by EMS.  Possible PE,  IV saline.      Chest pain, unspecified type - Plan: EKG 12-Lead, POCT CBC, POCT Influenza A/B, DG Chest 2 View, POCT glucose (manual entry), POCT urinalysis dipstick  Cough - Plan: DG Chest 2  View, POCT glucose (manual entry), POCT urinalysis dipstick  Leukocytosis, unspecified type - Plan: POCT urinalysis dipstick  Abnormal EKG  Ivar Drape, PA-C [facility] 4/10/201912:39 PM

## 2017-06-28 NOTE — Consult Note (Signed)
Cardiology Consultation:   Patient ID: Thomas [name]; [id number]; Feb 25, 1956   Admit date: 06/28/2017 Date of Consult: 06/28/2017  Primary Care Provider: Patient, No Pcp Per Primary Cardiologist: No primary care provider on file. New Primary Electrophysiologist:  n/a   Patient Profile:   Thomas [name] is a 62 y.o. male smoker, but without a history of other cardiac disease or risk factors, who is being seen today for the evaluation of abnormal ECG at the request of Dr. Gilford Raid.  History of Present Illness:   [name] had some burning upper left chest discomfort yesterday that was not clearly pleuritic but was also not exertional.  This happened around 9 or 10 PM, but then resolved spontaneously after roughly an hour.  Around 3 AM he had shaking chills, fever and sweating.  He was seen at [facility] where his electrocardiogram showed sinus rhythm with right bundle branch block and ST elevation across the anterior precordial leads.  A repeat ECG performed after transfer to [facility] emergency room shows a similar pattern with slight worsening of the anterior ST segment elevation.  He has not had any recurrence of the chest discomfort.  He feels that he has a sore throat and swollen lymph glands in his neck.  He denies orthopnea or PND.  He feels dizzy every time he turns his head, but denies any other neurological symptoms.  He does have a nonproductive cough but it is not 1 of his major complaints.  He had transient nausea and vomiting.  Does not have any sick contacts recently.  He remembers having pneumonia in the 1990s and the chest pain at that time was clearly pleuritic.  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  His chart lists a diagnosis of hypertension, but he does not take any medications for this and his blood pressure is completely normal.  His blood pressure was high during an emergency room visit for leg pain in December  2018.  Past Medical History:  Diagnosis Date  . Hypertension   He appears to have chronic kidney disease: Creatinine was 1.54 in December 2018 and is 1.74 today  Past surgical history: Reviewed, denies previous major surgeries  Home Medications:  Prior to Admission medications   Medication Sig Start Date End Date Taking? Authorizing Provider  aspirin-sod bicarb-citric acid (ALKA-SELTZER) 325 MG TBEF tablet Take 325 mg by mouth daily as needed.   Yes [provider]  cyclobenzaprine (FLEXERIL) 10 MG tablet Take 1 tablet (10 mg total) by mouth 2 (two) times daily as needed for muscle spasms. Patient not taking: Reported on 06/28/2017 02/27/17   Delia Heady, PA-C    Inpatient Medications: Scheduled Meds:  Continuous Infusions:  PRN Meds:   Allergies:   No Known Allergies  Social History:   Social History   Socioeconomic History  . Marital status: Divorced    Spouse name: Not on file  . Number of children: Not on file  . Years of education: Not on file  . Highest education level: Not on file  Occupational History  . Not on file  Social Needs  . Financial resource strain: Not on file  . Food insecurity:    Worry: Not on file    Inability: Not on file  . Transportation needs:    Medical: Not on file    Non-medical: Not on file  Tobacco Use  . Smoking status: Never Smoker  . Smokeless tobacco: Never  Used  Substance and Sexual Activity  . Alcohol use: No  . Drug use: No  . Sexual activity: Not on file  Lifestyle  . Physical activity:    Days per week: Not on file    Minutes per session: Not on file  . Stress: Not on file  Relationships  . Social connections:    Talks on phone: Not on file    Gets together: Not on file    Attends religious service: Not on file    Active member of club or organization: Not on file    Attends meetings of clubs or organizations: Not on file    Relationship status: Not on file  . Intimate partner violence:    Fear of  current or ex partner: Not on file    Emotionally abused: Not on file    Physically abused: Not on file    Forced sexual activity: Not on file  Other Topics Concern  . Not on file  Social History Narrative  . Not on file    Family History:   He denies a family history of premature coronary or other cardiac illness.  ROS:  Please see the history of present illness.   All other ROS reviewed and negative.     Physical Exam/Data:   Vitals:   06/28/17 1533 06/28/17 1543 06/28/17 1544  BP: 116/77 116/77   Pulse:  95 93  Resp:  20   Temp:  98.6 F (37 C)   TempSrc:  Oral   SpO2:  98% 97%    General:  Well nourished, well developed, in no acute distress he looks very thin but not malnourished HEENT: normal Lymph: no adenopathy Neck: no JVD Endocrine:  No thryomegaly Vascular: No carotid bruits; FA pulses 2+ bilaterally without bruits  Cardiac:  normal S1, S2; RRR; no murmur , I do not hear a rub Lungs: Dry crackles are heard over the anterior and posterior left lower chest, without clear-cut dullness to percussion or other signs of consolidation, no wheezing, rhonchi or rales  Abd: soft, nontender, no hepatomegaly  Ext: no edema Musculoskeletal:  No deformities, BUE and BLE strength normal and equal Skin: warm and dry  Neuro:  CNs 2-12 intact, no focal abnormalities noted Psych:  Normal affect   EKG:  The EKG was personally reviewed and demonstrates: Sinus rhythm, right bundle branch block, anterior ST segment elevation also seen in the high lateral leads in leads II Telemetry:  Telemetry was personally reviewed and demonstrates: Sinus rhythm  Relevant CV Studies: None available  Laboratory Data:  Chemistry Recent Labs  Lab 06/28/17 1544  NA 137  K 3.8  CL 106  CO2 23  GLUCOSE 107*  BUN 25*  CREATININE 1.74*  CALCIUM 8.4*  GFRNONAA 40*  GFRAA 46*  ANIONGAP 8    No results for input(s): PROT, ALBUMIN, AST, ALT, ALKPHOS, BILITOT in the last 168  hours. Hematology Recent Labs  Lab 06/28/17 1335 06/28/17 1544  WBC 21.8* 24.1*  RBC 5.70 5.15  HGB 14.5 13.6  HCT 44.4 40.2  MCV 77.8* 78.1  MCH 25.5* 26.4  MCHC 32.8 33.8  RDW  --  14.2  PLT  --  226   Cardiac EnzymesNo results for input(s): TROPONINI in the last 168 hours.  Recent Labs  Lab 06/28/17 1552  TROPIPOC 0.33*    BNPNo results for input(s): BNP, PROBNP in the last 168 hours.  DDimer No results for input(s): DDIMER in the last 168 hours.  Radiology/Studies:  Dg Chest 2 View  Result Date: 06/28/2017 CLINICAL DATA:  Chest pain. EXAM: CHEST - 2 VIEW COMPARISON:  No prior. FINDINGS: Thoracic aorta is tortuous. Heart size normal. Diffuse bilateral pulmonary interstitial prominence noted. Although an active interstitial process may be present changes may be secondary to chronic interstitial lung disease. A component of bronchiectasis most likely present in the lung bases. No focal alveolar infiltrate. Biapical and left-sided pleural thickening noted most likely secondary to scarring. Small left pleural effusion may be present. IMPRESSION: 1. Interstitial lung disease possibly chronic. A component of basilar bronchiectasis most likely present in the lung bases. Pleural scarring. 2. Tortuous thoracic aorta. Heart size normal. No pulmonary venous congestion. Electronically Signed   By: Marcello Moores  Register   On: 06/28/2017 13:55    Assessment and Plan:   1. Probable acute pericarditis: Some of his ECG abnormalities are chronic.  ECG from 2013 shows right bundle branch block and also a hint of ST segment elevation in the anterior precordial leads.  This is clearly more obvious on today's tracing and subtle ST elevation is also seen in leads I, 2, aVL.  I do not think that his ECG abnormalities represent an acute ST segment elevation myocardial infarction.  At this point, I do not think it is justified to start intravenous heparin, but it would not hurt to give him low-dose aspirin 81  mg.  It appears more likely that he has acute infectious pericarditis.  He is not having any chest pain right now and it is not imperative to start NSAIDs. 2. Abnormal cardiac troponin I: Could be explained by pericarditis.  Repeat assays should be performed.  Recommend an echocardiogram to exclude regional wall motion abnormalities. 3. CKD: Would use caution with nonsteroidal anti-inflammatory drugs because of what appears to be acute on chronic renal insufficiency.  Probably requires some rehydration.   Although the chest x-ray does not show any clear-cut infiltrates, his physical exam is worrisome for development of left lower lobe pneumonia.  He describes true rigors and is possible that his chest x-ray is lagging behind the development of a left lower lobe bacterial pneumonia.  This could also be a cause for acute pericarditis   For questions or updates, please contact [place] Please consult [email or web address] for contact info under Cardiology/STEMI.   Signed, Sanda Klein, MD  06/28/2017 4:58 PM

## 2017-06-28 NOTE — Progress Notes (Signed)
CRITICAL VALUE ALERT  Critical Value:  troponin 0.45  Date & Time Notied:  06/28/17 2120  Provider Notified: [place]  Orders Received/Actions taken: no new orders at this time

## 2017-06-28 NOTE — H&P (Signed)
History and Physical    Thomas [name] VQM:[id number] DOB: [birth year] DOA: 06/28/2017  PCP: Patient, No Pcp Per  Patient coming from: Home via urgent care  I have personally briefly reviewed patient's old medical records in [place]  Chief Complaint: Weakness/chills/dizziness  HPI: Thomas [name] is a 62 y.o. male with medical history significant of ongoing tobacco abuse otherwise unremarkable presented to the ED with sudden onset of subjective fevers, chills, diaphoresis, nonproductive cough that started at 4 AM on the day of admission with 3 episodes of emesis and some nausea.  Patient also with complaints of sore throat.  Patient stated that the night prior to admission around 10 PM had an episode of left upper chest pain describes as a sharp pain that lasted approximately 2 hours and subsequently resolved with no further recurrence. Patient denies any wheezing, no shortness of breath, no lower extremity swelling, no abdominal pain, no diarrhea, no constipation, no dysuria, no melena, no hematemesis, no hematochezia, no pleuritic chest pain.  Patient denies any focal neurological deficits.  Patient denies any headache.  Patient does complain of dizziness with head turning that he states has been ongoing for the past 3-4 years with an associated spinning sensation.  Patient states had never been worked up for his dizziness.  Patient denies any orthopnea, no paroxysmal nocturnal dyspnea, no family history of premature coronary artery disease.  Patient denies any shortness of breath on exertion. Patient initially seen at [facility] where EKG done showed sinus rhythm with right bundle branch block and ST elevation in the anterior leads.  Patient subsequently transferred to [facility].  [facility] EKG done showed right bundle branch block with anterior ST segment elevation.  ED Course: Patient seen in the ED, basic metabolic profile with a glucose of 107, BUN of 25, creatinine of 1.74.  Point-of-care  troponin was elevated at 0.33.  CBC had a white count of 24.1 otherwise was unremarkable.  Urinalysis which was done was nitrite negative, leukocytes negative.  Point-of-care influenza A /B was negative.  Chest x-ray done showed interstitial lung disease possibly chronic, complement of basilar bronchiectasis most likely present in the lung bases.  Pleural scarring.  Tortuous thoracic aorta.  Heart size normal.  No pulmonary venous congestion.  EKG done initially at [facility] showed right bundle branch block with ST elevation in the anterior leads.  Repeat EKG done at [facility] was similar with right bundle branch block and ST elevation anterior leads.  Patient given a dose of aspirin and cardiology was consulted.  Review of Systems: As per HPI otherwise 10 point review of systems negative.   Past Medical History:  Diagnosis Date  . Hypertension     History reviewed. No pertinent surgical history.   reports that he has been smoking cigarettes.  He has never used smokeless tobacco. He reports that he does not drink alcohol or use drugs.  No Known Allergies  History reviewed. No pertinent family history. Patient is originally from [place] and has been in the [place] for approximately 30 years.  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  Patient states he believes his father died at age 49 from diarrhea/GI issues.  Patient states mother deceased age unknown however does states she was pounding something, developed a headache and subsequently went to rest and subsequently died unsure as to whether she may have had a stroke per patient.  Prior to Admission medications   Medication Sig Start Date End Date Taking? Authorizing Provider  aspirin-sod bicarb-citric acid (ALKA-SELTZER) 325 MG TBEF tablet Take 325 mg by mouth daily as needed.   Yes [provider]  cyclobenzaprine (FLEXERIL) 10 MG tablet  Take 1 tablet (10 mg total) by mouth 2 (two) times daily as needed for muscle spasms. Patient not taking: Reported on 06/28/2017 02/27/17   Delia Heady, PA-C    Physical Exam: Vitals:   06/28/17 1533 06/28/17 1543 06/28/17 1544  BP: 116/77 116/77   Pulse:  95 93  Resp:  20   Temp:  98.6 F (37 C)   TempSrc:  Oral   SpO2:  98% 97%    Constitutional: NAD, calm, comfortable Vitals:   06/28/17 1533 06/28/17 1543 06/28/17 1544  BP: 116/77 116/77   Pulse:  95 93  Resp:  20   Temp:  98.6 F (37 C)   TempSrc:  Oral   SpO2:  98% 97%   Eyes: PERRL, lids and conjunctivae normal ENMT: Mucous membranes are dry. Posterior pharynx clear of any exudate or lesions.Normal dentition.  Neck: normal, supple, no masses, no thyromegaly Respiratory: Diffuse dry crackles in the bases.  No rhonchi noted.  No wheezing noted. Normal respiratory effort. No accessory muscle use.  Cardiovascular: Regular rate and rhythm, no murmurs / rubs / gallops. No extremity edema. 2+ pedal pulses. No carotid bruits.  Abdomen: no tenderness, no masses palpated. No hepatosplenomegaly. Bowel sounds positive.  Musculoskeletal: no clubbing / cyanosis. No joint deformity upper and lower extremities. Good ROM, no contractures. Normal muscle tone.  Skin: no rashes, lesions, ulcers. No induration Neurologic: CN 2-12 grossly intact. Sensation intact, DTR normal. Strength 5/5 in all 4.  Psychiatric: Normal judgment and insight. Alert and oriented x 3. Normal mood.    Labs on Admission: I have personally reviewed following labs and imaging studies  CBC: Recent Labs  Lab 06/28/17 1335 06/28/17 1544  WBC 21.8* 24.1*  HGB 14.5 13.6  HCT 44.4 40.2  MCV 77.8* 78.1  PLT  --  413   Basic Metabolic Panel: Recent Labs  Lab 06/28/17 1544  NA 137  K 3.8  CL 106  CO2 23  GLUCOSE 107*  BUN 25*  CREATININE 1.74*  CALCIUM 8.4*  MG 1.4*   GFR: CrCl cannot be calculated (Unknown ideal weight.). Liver Function Tests: No  results for input(s): AST, ALT, ALKPHOS, BILITOT, PROT, ALBUMIN in the last 168 hours. No results for input(s): LIPASE, AMYLASE in the last 168 hours. No results for input(s): AMMONIA in the last 168 hours. Coagulation Profile: No results for input(s): INR, PROTIME in the last 168 hours. Cardiac Enzymes: Recent Labs  Lab 06/28/17 1544  TROPONINI 0.34*   BNP (last 3 results) No results for input(s): PROBNP in the last 8760 hours. HbA1C: No results for input(s): HGBA1C in the last 72 hours. CBG: Recent Labs  Lab 06/28/17 1625  GLUCAP 115*   Lipid Profile: No results for input(s): CHOL, HDL, LDLCALC, TRIG, CHOLHDL, LDLDIRECT in the last 72 hours. Thyroid Function Tests: No results for input(s): TSH, T4TOTAL, FREET4, T3FREE, THYROIDAB in the last 72 hours. Anemia Panel: No results for input(s): VITAMINB12, FOLATE, FERRITIN, TIBC, IRON, RETICCTPCT in the last 72 hours. Urine analysis:    Component Value Date/Time   BILIRUBINUR negative 06/28/2017 1415   KETONESUR trace (5) (A) 06/28/2017 1415   PROTEINUR =100 (A) 06/28/2017 1415   UROBILINOGEN 2.0 (A) 06/28/2017 1415   NITRITE Negative 06/28/2017  [place] Negative 06/28/2017 1415    Radiological Exams on Admission: Dg Chest 2 View  Result Date: 06/28/2017 CLINICAL DATA:  Chest pain. EXAM: CHEST - 2 VIEW COMPARISON:  No prior. FINDINGS: Thoracic aorta is tortuous. Heart size normal. Diffuse bilateral pulmonary interstitial prominence noted. Although an active interstitial process may be present changes may be secondary to chronic interstitial lung disease. A component of bronchiectasis most likely present in the lung bases. No focal alveolar infiltrate. Biapical and left-sided pleural thickening noted most likely secondary to scarring. Small left pleural effusion may be present. IMPRESSION: 1. Interstitial lung disease possibly chronic. A component of basilar bronchiectasis most likely present in the lung bases. Pleural  scarring. 2. Tortuous thoracic aorta. Heart size normal. No pulmonary venous congestion. Electronically Signed   By: Marcello Moores  Register   On: 06/28/2017 13:55    EKG: Independently reviewed.  Right bundle branch block with some ST elevation anterior leads  Assessment/Plan Principal Problem:   Chest pain Active Problems:   CAP (community acquired pneumonia): Clinical   Acute pericarditis: Probable   Acute renal failure superimposed on stage 3 chronic kidney disease (HCC)   Dehydration   Leukocytosis   Elevated troponin   Dizziness   Vertigo    #1 chest pain/probable acute pericarditis Patient presenting with an episode of chest pain which lasted 2 hours the night prior to admission and noted to have elevated point-of-care troponins.  Patient with no family history of premature coronary artery disease however patient with an extensive history of tobacco abuse.  Hypertension is listed in patient's past medical history however patient denies any significant history of hypertension and is not on any antihypertensive medications.  EKG which was done showed a right bundle branch block and some ST elevation in the anterior leads.  Patient has been seen in consultation by cardiology who feel patient's chest pain likely secondary to probable acute pericarditis and doubt if patient is having an acute coronary syndrome.  Will admit patient cycle cardiac enzymes every 6 hours x3.  Check a 2D echo.  Patient to be started on aspirin 81 mg daily as recommended per cardiology.  Will defer treatment of probable acute pericarditis to cardiology.  2.  Elevated troponins Likely secondary to problem #1.  We will cycle cardiac enzymes every 6 hours x3.  Check a 2D echo.  Check a fasting lipid panel.  Placed on aspirin 81 mg daily.  EKG with some ST elevation in the anterior leads and right bundle branch block and per cardiology concerning more for an acute pericarditis.  Cardiology following.  3.  Probable clinical  pneumonia Patient presented with fevers, chills, significant diaphoresis that woke him up around 4 AM on the day of admission.  Patient also noted to have a nonproductive cough.  Patient with complaints of sore throat.  Chest x-ray done on admission with interstitial lung disease possibly chronic, component of basilar bronchiectasis most likely present in the lung bases.  Pleural scarring.  Tortuous thoracic aorta.  Heart size normal.  No pulmonary venous congestion.  Patient also noted to have a significant leukocytosis on CBC with a white count of 24,000.  Check a sputum Gram stain and culture.  Will check a urine strep pneumococcus antigen.  Urine Legionella antigen.  Check blood cultures x2.  Place empirically on IV Rocephin and azithromycin.  Will place on some scheduled duo nebs.  Place on [place].  Place on Mucinex.  Follow.  4.  Acute on chronic kidney  disease stage III Likely secondary to a prerenal azotemia as patient noted to have borderline blood pressure on presentation.  We will check a fractional excretion of sodium.  Placed on IV fluids.  Follow.  No significant improvement with renal function will get a renal ultrasound and consult with nephrology for further evaluation and management.  5.  Dehydration IV fluids.  6.  Dizziness/probable vertigo Patient with complaints of dizziness when he turns his head with an associated spinning sensation.  Patient states this has been ongoing for about 3 years and states no formal workup was done.  Will get a MRI of the head to rule out any posterior circulatory abnormalities.  Placed on meclizine 25 mg every 8 hours as needed.  Consult with PT for vestibular evaluation.  Follow.  7.  Leukocytosis Questionable etiology.  Urinalysis done was nitrite negative, leukocytes negative.  Chest x-ray with interstitial lung disease noted and concern for possible basilar bronchiectasis.  Check blood cultures x2.  Check a sputum Gram stain and culture.  Check a  urine Legionella antigen.  Check a urine pneumococcus antigen.  Placed empirically on IV Rocephin and azithromycin for probable clinical community-acquired pneumonia.  Follow.  DVT prophylaxis: Heparin Code Status: Full Family Communication: Updated patient.  No family at bedside. Disposition Plan: Home once clinically improved and medically stable. Consults called: Cardiology: Dr.Croitoru Admission status: Admit to inpatient.   Irine Seal MD [facility] Pager [phone]  If 7PM-7AM, please contact night-coverage [email or web address] Password [facility]  06/28/2017, 6:56 PM

## 2017-06-28 NOTE — ED Provider Notes (Addendum)
[facility] DEPT Provider Note   CSN: [id number] Arrival date & time: 06/28/17  1505     History   Chief Complaint Chief Complaint  Patient presents with  . Dizziness  . Weakness    HPI Thomas [name] is a 62 y.o. male with past medical history of hypertension and years of experiencing dizziness with head movement presenting via EMS from urgent care with cold symptoms including subjective fever, chills, one episode of nonradiating chest discomfort yesterday at rest no chest pain since, some coughing today.  Also reports associated vomiting. He denies any headache, visual disturbances, weakness, numbness. Patient was orthostatic at urgent care given some fluids.  Patient is an everyday smoker at approximately 4 cigarettes/day.  HPI  Past Medical History:  Diagnosis Date  . Hypertension     There are no active problems to display for this patient.   No past surgical history on file.      Home Medications    Prior to Admission medications   Medication Sig Start Date End Date Taking? Authorizing Provider  aspirin-sod bicarb-citric acid (ALKA-SELTZER) 325 MG TBEF tablet Take 325 mg by mouth daily as needed.   Yes [provider]  cyclobenzaprine (FLEXERIL) 10 MG tablet Take 1 tablet (10 mg total) by mouth 2 (two) times daily as needed for muscle spasms. Patient not taking: Reported on 06/28/2017 02/27/17   Delia Heady, PA-C    Family History No family history on file.  Social History Social History   Tobacco Use  . Smoking status: Never Smoker  . Smokeless tobacco: Never Used  Substance Use Topics  . Alcohol use: No  . Drug use: No     Allergies   Patient has no known allergies.   Review of Systems Review of Systems  Constitutional: Positive for chills, fatigue and fever.  HENT: Positive for congestion.   Eyes: Negative for pain and visual disturbance.  Respiratory: Positive for cough. Negative for choking, chest  tightness, shortness of breath, wheezing and stridor.   Cardiovascular: Positive for chest pain. Negative for palpitations and leg swelling.       One episode of left sided non-radiating chest pain/burning yesterday, none since  Gastrointestinal: Positive for nausea and vomiting. Negative for abdominal distention, abdominal pain, blood in stool and diarrhea.  Genitourinary: Negative for dysuria, flank pain, frequency and hematuria.  Musculoskeletal: Negative for arthralgias, back pain, gait problem, joint swelling, myalgias, neck pain and neck stiffness.  Skin: Negative for color change, pallor and rash.  Neurological: Positive for dizziness and light-headedness. Negative for tremors, seizures, syncope, facial asymmetry, speech difficulty, weakness, numbness and headaches.       Reports years of vertigo-like symptoms with head movement. No new symptoms.     Physical Exam Updated Vital Signs BP 116/77 (BP Location: Right Arm)   Pulse 93   Temp 98.6 F (37 C) (Oral)   Resp 20   SpO2 97%   Physical Exam  Constitutional: He is oriented to person, place, and time. He appears well-developed and well-nourished. No distress.  Afebrile, nontoxic-appearing, lying comfortably in bed no acute distress.  HENT:  Head: Normocephalic and atraumatic.  Right Ear: External ear normal.  Left Ear: External ear normal.  Nose: Nose normal.  Mouth/Throat: Oropharynx is clear and moist. No oropharyngeal exudate.  Eyes: Pupils are equal, round, and reactive to light. Conjunctivae and EOM are normal.  Neck: Normal range of motion. Neck supple.  Cardiovascular: Normal rate, regular rhythm, normal heart sounds and intact  distal pulses.  No murmur heard. Pulmonary/Chest: Effort normal and breath sounds normal. No stridor. No respiratory distress. He has no wheezes. He has no rales. He exhibits no tenderness.  Abdominal: Soft. He exhibits no distension and no mass. There is no tenderness. There is no rebound and  no guarding.  Musculoskeletal: Normal range of motion. He exhibits no edema, tenderness or deformity.  Neurological: He is alert and oriented to person, place, and time. No cranial nerve deficit or sensory deficit. He exhibits normal muscle tone. Coordination normal.  Neurologic Exam:  - Mental status: Patient is alert and cooperative. Fluent speech and words are clear. Coherent thought processes and insight is good. Patient is oriented x 4 to person, place, time and event.  - Cranial nerves:  CN III, IV, VI: pupils equally round, reactive to light both direct and conscensual. Full extra-ocular movement. CN V: motor temporalis and masseter strength intact. CN VII : muscles of facial expression intact. CN X :  midline uvula. XI strength of sternocleidomastoid and trapezius muscles 5/5, XII: tongue is midline when protruded. - Motor: No involuntary movements. Muscle tone and bulk normal throughout. Muscle strength is 5/5 in bilateral shoulder abduction, elbow flexion and extension, grip, hip extension, flexion, leg flexion and extension, ankle dorsiflexion and plantar flexion.  - Sensory:  light tough sensation intact in all extremities.  - Cerebellar: rapid alternating movements and point to point movement intact in upper and lower extremities. Normal stance and gait.  Skin: Skin is warm and dry. No rash noted. He is not diaphoretic. No erythema. No pallor.  Psychiatric: He has a normal mood and affect.  Nursing note and vitals reviewed.    ED Treatments / Results  Labs (all labs ordered are listed, but only abnormal results are displayed) Labs Reviewed  BASIC METABOLIC PANEL - Abnormal; Notable for the following components:      Result Value   Glucose, Bld 107 (*)    BUN 25 (*)    Creatinine, Ser 1.74 (*)    Calcium 8.4 (*)    GFR calc non Af Amer 40 (*)    GFR calc Af Amer 46 (*)    All other components within normal limits  CBC - Abnormal; Notable for the following components:   WBC  24.1 (*)    All other components within normal limits  CBG MONITORING, ED - Abnormal; Notable for the following components:   Glucose-Capillary 115 (*)    All other components within normal limits  I-STAT TROPONIN, ED - Abnormal; Notable for the following components:   Troponin i, poc 0.33 (*)    All other components within normal limits  URINALYSIS, ROUTINE W REFLEX MICROSCOPIC  CBG MONITORING, ED  I-STAT TROPONIN, ED    BUN  Date Value Ref Range Status  06/28/2017 25 (H) 6 - 20 mg/dL Final  02/27/2017 16 6 - 20 mg/dL Final   Creatinine, Ser  Date Value Ref Range Status  06/28/2017 1.74 (H) 0.61 - 1.24 mg/dL Final  02/27/2017 1.54 (H) 0.61 - 1.24 mg/dL Final    EKG None  Radiology Dg Chest 2 View  Result Date: 06/28/2017 CLINICAL DATA:  Chest pain. EXAM: CHEST - 2 VIEW COMPARISON:  No prior. FINDINGS: Thoracic aorta is tortuous. Heart size normal. Diffuse bilateral pulmonary interstitial prominence noted. Although an active interstitial process may be present changes may be secondary to chronic interstitial lung disease. A component of bronchiectasis most likely present in the lung bases. No focal alveolar infiltrate. Biapical  and left-sided pleural thickening noted most likely secondary to scarring. Small left pleural effusion may be present. IMPRESSION: 1. Interstitial lung disease possibly chronic. A component of basilar bronchiectasis most likely present in the lung bases. Pleural scarring. 2. Tortuous thoracic aorta. Heart size normal. No pulmonary venous congestion. Electronically Signed   By: Marcello Moores  Register   On: 06/28/2017 13:55    Procedures Procedures (including critical care time)  Medications Ordered in ED Medications  aspirin chewable tablet 324 mg (324 mg Oral Given 06/28/17 1646)     Initial Impression / Assessment and Plan / ED Course  I have reviewed the triage vital signs and the nursing notes.  Pertinent labs & imaging results that were available during  my care of the patient were reviewed by me and considered in my medical decision making (see chart for details).     Patient presents from urgent care via EMS with multiple symptoms including cold-like symptoms, fever, chills, cough nausea/vomiting.  Reports an episode of chest discomfort yesterday with associated vomiting.  This occurred at rest and resolved since. Chronic vertigo-like symptoms for years remains unchanged. Denies any headache, chest pain or dizziness at this time. No nausea.  Normal neuro exam. Initial troponin positive at 0.33. EKG with mild and diffuse ST elevations.  Consulted cardiology and spoke to Dr. Sallyanne Kuster who came in and saw patient read EKGs.  He is suspecting pericarditis as etiology of patient's EKG changes and troponin.  He does not recommend heparin or bringing the patient to the Cath Lab at this time. He does recommend ECHO once admitted to medicine.  Leukocytosis at 24.1.  Renal function appears to be near baseline for patient. On reassessment, patient denies any chest pain. He is afebrile without the use of antipyretics and nontoxic-appearing.  Will call for admission for trending troponin and echo. Spoke to Dr. Grandville Silos who will be admitting patient.  Final Clinical Impressions(s) / ED Diagnoses   Final diagnoses:  Viral upper respiratory tract infection  Elevated troponin    ED Discharge Orders    None       Dossie Der 06/28/17 1802    Emeline General, PA-C 06/28/17 Grier Mitts    Isla Pence, MD 07/02/17 1505

## 2017-06-28 NOTE — ED Notes (Signed)
CRITICAL VALUE STICKER  CRITICAL VALUE: Troponin = 0.34  RECEIVER (on-site recipient of call): M.Cleda Mccreedy (primary RN [facility] notified)  DATE & TIME NOTIFIED: 06/28/17 1847  MESSENGER (representative from lab): Nunzio Cory  MD NOTIFIED: Hospitalist paged  TIME OF NOTIFICATION: 06/28/17 1648  RESPONSE: Awaiting response

## 2017-06-29 ENCOUNTER — Other Ambulatory Visit: Payer: Self-pay

## 2017-06-29 ENCOUNTER — Inpatient Hospital Stay (HOSPITAL_COMMUNITY): Payer: Private Health Insurance - Indemnity

## 2017-06-29 DIAGNOSIS — R748 Abnormal levels of other serum enzymes: Secondary | ICD-10-CM

## 2017-06-29 DIAGNOSIS — I63443 Cerebral infarction due to embolism of bilateral cerebellar arteries: Secondary | ICD-10-CM

## 2017-06-29 DIAGNOSIS — R072 Precordial pain: Secondary | ICD-10-CM

## 2017-06-29 LAB — PROCALCITONIN: Procalcitonin: 39.9 ng/mL

## 2017-06-29 LAB — BASIC METABOLIC PANEL
ANION GAP: 7 (ref 5–15)
BUN: 23 mg/dL — ABNORMAL HIGH (ref 6–20)
CALCIUM: 8.2 mg/dL — AB (ref 8.9–10.3)
CHLORIDE: 106 mmol/L (ref 101–111)
CO2: 23 mmol/L (ref 22–32)
Creatinine, Ser: 1.64 mg/dL — ABNORMAL HIGH (ref 0.61–1.24)
GFR calc Af Amer: 50 mL/min — ABNORMAL LOW (ref >60)
GFR calc non Af Amer: 43 mL/min — ABNORMAL LOW (ref >60)
GLUCOSE: 105 mg/dL — AB (ref 65–99)
Potassium: 3.9 mmol/L (ref 3.5–5.1)
Sodium: 136 mmol/L (ref 135–145)

## 2017-06-29 LAB — BLOOD CULTURE ID PANEL (REFLEXED)
Acinetobacter baumannii: NOT DETECTED
Candida albicans: NOT DETECTED
Candida glabrata: NOT DETECTED
Candida krusei: NOT DETECTED
Candida parapsilosis: NOT DETECTED
Candida tropicalis: NOT DETECTED
ENTEROBACTER CLOACAE COMPLEX: NOT DETECTED
Enterobacteriaceae species: NOT DETECTED
Enterococcus species: NOT DETECTED
Escherichia coli: NOT DETECTED
Haemophilus influenzae: NOT DETECTED
Klebsiella oxytoca: NOT DETECTED
Klebsiella pneumoniae: NOT DETECTED
Listeria monocytogenes: NOT DETECTED
NEISSERIA MENINGITIDIS: NOT DETECTED
PROTEUS SPECIES: NOT DETECTED
PSEUDOMONAS AERUGINOSA: NOT DETECTED
SERRATIA MARCESCENS: NOT DETECTED
STAPHYLOCOCCUS AUREUS BCID: NOT DETECTED
STAPHYLOCOCCUS SPECIES: NOT DETECTED
STREPTOCOCCUS AGALACTIAE: NOT DETECTED
STREPTOCOCCUS PNEUMONIAE: DETECTED — AB
Streptococcus pyogenes: NOT DETECTED
Streptococcus species: DETECTED — AB

## 2017-06-29 LAB — CBC
HEMATOCRIT: 41 % (ref 39.0–52.0)
HEMOGLOBIN: 13.7 g/dL (ref 13.0–17.0)
MCH: 26.1 pg (ref 26.0–34.0)
MCHC: 33.4 g/dL (ref 30.0–36.0)
MCV: 78.1 fL (ref 78.0–100.0)
Platelets: 175 10*3/uL (ref 150–400)
RBC: 5.25 MIL/uL (ref 4.22–5.81)
RDW: 14.4 % (ref 11.5–15.5)
WBC: 17.3 10*3/uL — ABNORMAL HIGH (ref 4.0–10.5)

## 2017-06-29 LAB — HEMOGLOBIN A1C
HEMOGLOBIN A1C: 5.6 % (ref 4.8–5.6)
Mean Plasma Glucose: 114.02 mg/dL

## 2017-06-29 LAB — LIPID PANEL
CHOL/HDL RATIO: 3.5 ratio
Cholesterol: 128 mg/dL (ref 0–200)
HDL: 37 mg/dL — AB (ref >40)
LDL Cholesterol: 72 mg/dL (ref 0–99)
Triglycerides: 97 mg/dL (ref <150)
VLDL: 19 mg/dL (ref 0–40)

## 2017-06-29 LAB — PROTIME-INR
INR: 1.23
PROTHROMBIN TIME: 15.4 s — AB (ref 11.4–15.2)

## 2017-06-29 LAB — MAGNESIUM: MAGNESIUM: 2.5 mg/dL — AB (ref 1.7–2.4)

## 2017-06-29 LAB — TROPONIN I
Troponin I: 0.41 ng/mL (ref <0.03)
Troponin I: 0.34 ng/mL (ref <0.03)

## 2017-06-29 LAB — GLUCOSE, CAPILLARY: Glucose-Capillary: 90 mg/dL (ref 65–99)

## 2017-06-29 LAB — HIV ANTIBODY (ROUTINE TESTING W REFLEX): HIV Screen 4th Generation wRfx: NONREACTIVE

## 2017-06-29 LAB — APTT: APTT: 47 s — AB (ref 24–36)

## 2017-06-29 LAB — ECHOCARDIOGRAM COMPLETE
HEIGHTINCHES: 65 in
Weight: 2257.51 oz

## 2017-06-29 MED ORDER — SODIUM CHLORIDE 0.9 % IV SOLN
2.0000 g | INTRAVENOUS | Status: DC
  Administered 2017-06-29: 2 g via INTRAVENOUS
  Filled 2017-06-29: qty 20
  Filled 2017-06-29: qty 2

## 2017-06-29 MED ORDER — AZITHROMYCIN 250 MG PO TABS: 500.0000 mg | ORAL_TABLET | Freq: Every day | ORAL | Status: DC

## 2017-06-29 MED ORDER — ATORVASTATIN CALCIUM 40 MG PO TABS
40.0000 mg | ORAL_TABLET | Freq: Every day | ORAL | Status: DC
  Administered 2017-06-29 – 2017-07-13 (×14): 40 mg via ORAL
  Filled 2017-06-29 (×8): qty 1
  Filled 2017-06-29: qty 2
  Filled 2017-06-29 (×6): qty 1

## 2017-06-29 NOTE — Progress Notes (Addendum)
PROGRESS NOTE [facility]   Hawthorne Day   YHC:[id number] DOB: 01-08-1956  DOA: 06/28/2017 PCP: Patient, No Pcp Per   Brief Narrative:  Danielle Lento is a 62 year old male with medical history significant for tobacco abuse otherwise unremarkable.  Patient presented to the emergency department with sudden onset of chest pain, fevers, chills and nonproductive cough.  All other associated symptoms include vertigo and generalized weakness.  Patient was seen at [facility] where EKG was done and showed sinus rhythm with right bundle branch block and ST segment elevation in anterior leads, therefore patient was transferred to [facility] for further workup.  Cardiology evaluated patient and suspected pericarditis.  In the ED was found to have chest x-ray with vascular bronchiectasis, influenza A/B was negative, leukocytosis and elevated creatinine of 1.74.  Cardiac enzymes with elevated troponin.  And UA was negative.  Patient was admitted with working diagnosis of pneumonia and acute pericarditis.  Given vertigo MRI was performed which showed 3 acute nonhemorrhagic left cerebellar old infarct.  Neurology was consulted and recommended transfer to [facility] for embolic stroke workup.  Subjective: Patient seen and examined, he feels malaise.  Denies chest pain.  Pain all over his body.  Febrile overnight T-max 101.9.  Denies shortness of breath, palpitations and weakness.  Assessment & Plan: Chest pain suspected pericarditis Cardiology was consulted, aspirin 81 mg once was started.  No other NSAIDs at this time as patient is asymptomatic.  Troponin trend flat.  EKG today significant improvement.  Echocardiogram pending.  Continue management per cardiology recommendations, does not feel that patient have ACS at this point.  Community-acquired pneumonia Chest x-ray done on admission with interstitial lung disease I reviewed personally x-ray and feels that he has infiltrate on his left lower  lobe consistent with pneumonia.  Strep antigen is also positive.  Patient been treated with azithromycin and ceftriaxone will continue current regimen for now.  Leukocytosis improving.  Blood cultures pending will check pro-calcitonin and add DuoNeb as needed for shortness of breath.  Continue to monitor.  Addendum 3:29 PM  Strep Bacteremia  Increase Rocephin to 2G daily, d/c Azithromycin. Need TEE. May have endocarditis. ECHO with no significant abnormalities.   Acute cerebellar CVA Resultant vertigo MRI head shows 3 acute nonhemorrhagic small left cerebral infarct, old small bilateral cerebellar infarct and old basal ganglia and edematous infarct Case discussed with neurology, recommending transfer to [facility] for embolic workup. MRA head and neck ordered, echocardiogram pending.  Obtain A1c, LDL 72 - goal less than 70, will start Lipitor 40 mg daily. Continue meclizine 25 mg as needed.  Get PT/OT and speech evaluation.  May need a TEE  Acute on chronic kidney disease stage III Likely prerenal azotemia Patient on IV fluid, creatinine improving. Continue gentle hydration and monitor renal function Avoid nephrotoxic agent and hypotension  Elevated troponin Due to #, unlikely ischemia  DVT prophylaxis: Heparin SQ   Code Status: Full code Family Communication: Daughter at bedside Disposition Plan: Transfer to [facility] for further workup  Consultants:   Cardiology  Neurology  Procedures:   None  Antimicrobials: Anti-infectives (From admission, onward)   Start     Dose/Rate Route Frequency Ordered Stop   06/29/17 2200  azithromycin (ZITHROMAX) tablet 500 mg     500 mg Oral Daily at bedtime 06/29/17 1159 07/05/17 2159   06/28/17 2000  cefTRIAXone (ROCEPHIN) 1 g in sodium chloride 0.9 % 100 mL IVPB     1 g 200  mL/hr over 30 Minutes Intravenous Every 24 hours 06/28/17 1839 07/05/17 1959   06/28/17 2000  azithromycin (ZITHROMAX) 500 mg in sodium chloride 0.9  % 250 mL IVPB  Status:  Discontinued     500 mg 250 mL/hr over 60 Minutes Intravenous Every 24 hours 06/28/17 1839 06/29/17 1159       Objective: Vitals:   06/29/17 0500 06/29/17 0523 06/29/17 0945 06/29/17 1045  BP:      Pulse:      Resp:      Temp:  99.5 F (37.5 C) 100.2 F (37.9 C) 99.2 F (37.3 C)  TempSrc:  Oral Oral Oral  SpO2:      Weight: 64 kg (141 lb 1.5 oz)     Height:        Intake/Output Summary (Last 24 hours) at 06/29/2017 1158 Last data filed at 06/29/2017 1128 Gross per 24 hour  Intake 2481.67 ml  Output 1050 ml  Net 1431.67 ml   Filed Weights   06/28/17 2051 06/29/17 0500  Weight: 64 kg (141 lb) 64 kg (141 lb 1.5 oz)    Examination:  General exam: Appears calm and comfortable  HEENT: OP moist and clear Respiratory system: Decrease BS on LLL, faint rales at the LLL. No wheezing or crackles  Cardiovascular system: S1 & S2 heard, RRR. No JVD, murmurs, rubs or gallops Gastrointestinal system: Abdomen is nondistended, soft and nontender.  Central nervous system: Alert and oriented. No focal neurological deficits. NIH score 0 Extremities: No LEl edema,  strength 5/5   Skin: No rashes Psychiatry: Mood & affect appropriate.    Data Reviewed: I have personally reviewed following labs and imaging studies  CBC: Recent Labs  Lab 06/28/17 1335 06/28/17 1544 06/29/17 0320  WBC 21.8* 24.1* 17.3*  NEUTROABS  --  22.9*  --   HGB 14.5 13.6 13.7  HCT 44.4 40.2 41.0  MCV 77.8* 78.1 78.1  PLT  --  226 573   Basic Metabolic Panel: Recent Labs  Lab 06/28/17 1544 06/28/17 2110 06/29/17 0320  NA 137  --  136  K 3.8  --  3.9  CL 106  --  106  CO2 23  --  23  GLUCOSE 107*  --  105*  BUN 25*  --  23*  CREATININE 1.74*  --  1.64*  CALCIUM 8.4*  --  8.2*  MG 1.4* 1.5* 2.5*   GFR: Estimated Creatinine Clearance: 40.1 mL/min (A) (by C-G formula based on SCr of 1.64 mg/dL (H)). Liver Function Tests: No results for input(s): AST, ALT, ALKPHOS, BILITOT,  PROT, ALBUMIN in the last 168 hours. No results for input(s): LIPASE, AMYLASE in the last 168 hours. No results for input(s): AMMONIA in the last 168 hours. Coagulation Profile: Recent Labs  Lab 06/29/17 0320  INR 1.23   Cardiac Enzymes: Recent Labs  Lab 06/28/17 1544 06/28/17 2120 06/29/17 0320 06/29/17 0904  TROPONINI 0.34* 0.45* 0.41* 0.34*   BNP (last 3 results) No results for input(s): PROBNP in the last 8760 hours. HbA1C: Recent Labs    06/29/17 0320  HGBA1C 5.6   CBG: Recent Labs  Lab 06/28/17 1625 06/29/17 0813  GLUCAP 115* 90   Lipid Profile: Recent Labs    06/29/17 0320  CHOL 128  HDL 37*  LDLCALC 72  TRIG 97  CHOLHDL 3.5   Thyroid Function Tests: Recent Labs    06/28/17 2110  TSH 0.471   Anemia Panel: No results for input(s): VITAMINB12, FOLATE, FERRITIN, TIBC, IRON, RETICCTPCT  in the last 72 hours. Sepsis Labs: No results for input(s): PROCALCITON, LATICACIDVEN in the last 168 hours.  Recent Results (from the past 240 hour(s))  Culture, blood (Routine X 2) w Reflex to ID Panel     Status: None (Preliminary result)   Collection Time: 06/28/17  9:10 PM  Result Value Ref Range Status   Specimen Description   Final    BLOOD RIGHT ANTECUBITAL Performed at [facility] [address] [place], [place] [address]    Special Requests   Final    BOTTLES DRAWN AEROBIC AND ANAEROBIC Blood Culture adequate volume Performed at [facility] [address] [place], [place] [address]    Culture  Setup Time   Final    GRAM POSITIVE COCCI IN CHAINS IN BOTH AEROBIC AND ANAEROBIC BOTTLES CRITICAL VALUE NOTED.  VALUE IS CONSISTENT WITH PREVIOUSLY REPORTED AND CALLED VALUE. Performed at [facility], [place] [address] [place], [place] [address]    Culture GRAM POSITIVE COCCI IN CHAINS  Final   Report Status PENDING  Incomplete  Culture, blood (Routine X 2) w Reflex to ID Panel     Status: None (Preliminary  result)   Collection Time: 06/28/17  9:10 PM  Result Value Ref Range Status   Specimen Description   Final    BLOOD LEFT HAND Performed at [facility] [address] [place], [place] [address]    Special Requests   Final    BOTTLES DRAWN AEROBIC AND ANAEROBIC Blood Culture adequate volume Performed at [facility] [address], [place], [place] [address]    Culture  Setup Time   Final    GRAM POSITIVE COCCI IN CHAINS AEROBIC BOTTLE ONLY Organism ID to follow Performed at [facility], [place] [address], [place], [place] [address]    Culture GRAM POSITIVE COCCI IN CHAINS  Final   Report Status PENDING  Incomplete      Radiology Studies: Dg Chest 2 View  Result Date: 06/28/2017 CLINICAL DATA:  Chest pain. EXAM: CHEST - 2 VIEW COMPARISON:  No prior. FINDINGS: Thoracic aorta is tortuous. Heart size normal. Diffuse bilateral pulmonary interstitial prominence noted. Although an active interstitial process may be present changes may be secondary to chronic interstitial lung disease. A component of bronchiectasis most likely present in the lung bases. No focal alveolar infiltrate. Biapical and left-sided pleural thickening noted most likely secondary to scarring. Small left pleural effusion may be present. IMPRESSION: 1. Interstitial lung disease possibly chronic. A component of basilar bronchiectasis most likely present in the lung bases. Pleural scarring. 2. Tortuous thoracic aorta. Heart size normal. No pulmonary venous congestion. Electronically Signed   By: Marcello Moores  Register   On: 06/28/2017 13:55   Mr Brain Wo Contrast  Result Date: 06/28/2017 CLINICAL DATA:  Vertigo, suspect stroke.  Fever and cough. EXAM: MRI HEAD WITHOUT CONTRAST TECHNIQUE: Multiplanar, multiecho pulse sequences of the brain and surrounding structures were obtained without intravenous contrast. COMPARISON:  MRI of the head June 25, 2006 FINDINGS: Mild motion degraded  examination. INTRACRANIAL CONTENTS: 3 LEFT cerebellar foci of reduced diffusion measuring to 14 mm with low ADC values. No susceptibility artifact to suggest hemorrhage. Old bilateral basal ganglia infarcts new from prior MRI. Old small bilateral basal ganglia and thalami infarcts. Mild ex vacuo dilatation subjacent lateral ventricle. No parenchymal brain volume loss for age. No hydrocephalus. No suspicious parenchymal signal, masses, mass effect. No abnormal extra-axial fluid collections. No extra-axial masses.  VASCULAR: Normal major intracranial vascular flow voids present at skull base. SKULL AND UPPER CERVICAL SPINE: No abnormal sellar expansion. No suspicious calvarial bone marrow signal. Craniocervical junction maintained. SINUSES/ORBITS: Mild paranasal sinus mucosal thickening without air-fluid levels. Mastoid air cells are well aerated.The included ocular globes and orbital contents are non-suspicious. OTHER: None. IMPRESSION: 1. Three acute nonhemorrhagic small LEFT cerebellar infarcts. 2. Old small bilateral cerebellar infarcts. Old basal ganglia and thalamus infarcts. Electronically Signed   By: Elon Alas M.D.   On: 06/28/2017 21:16      Scheduled Meds: . aspirin EC  81 mg Oral Daily  . atorvastatin  40 mg Oral q1800  . heparin  5,000 Units Subcutaneous Q8H  . senna  1 tablet Oral BID   Continuous Infusions: . sodium chloride 100 mL/hr at 06/29/17 0647  . azithromycin Stopped (06/29/17 0130)  . cefTRIAXone (ROCEPHIN)  IV Stopped (06/29/17 0036)     LOS: 1 day    Time spent: Total of 35 minutes spent with pt, greater than 50% of which was spent in discussion of  treatment, counseling and coordination of care  Chipper Oman, MD Pager: Text Page via [email or web address]   If 7PM-7AM, please contact night-coverage [email or web address] 06/29/2017, 11:58 AM   Note - This record has been created using [employer]. Chart creation errors have been sought, but may not always have been  located. Such creation errors do not reflect on the standard of medical care.

## 2017-06-29 NOTE — Progress Notes (Signed)
Pt to transfer to [facility] . Report called to [facility] and will notify [facility] for transport

## 2017-06-29 NOTE — Evaluation (Signed)
Physical Therapy Vestibular Evaluation Patient Details Name: Thomas [name] MRN: [id number] DOB: 02/26/1956 Today's Date: 06/29/2017   History of Present Illness  Arif Kalmar is a 62 y.o. male with medical history significant of ongoing tobacco abuse otherwise unremarkable presented to the ED with sudden onset of subjective fevers, chills, diaphoresis, nonproductive cough, N&V and some EKG changes.  R/o MI, but possible endocarditis.  Clinical Impression  Patient presents with decreased mobility more due to general aches and soreness from febrile illness.  History of dizziness reviewed and symptoms consistent with cervicogenic dizziness which is best treated with manual PT.  Recommend outpatient PT upon d/c.  No further skilled PT needs at this time.  Pt needs to mobilize frequently with nursing.     Follow Up Recommendations Outpatient PT(for cervical manual therapy )    Equipment Recommendations  None recommended by PT    Recommendations for Other Services       Precautions / Restrictions Precautions Precautions: Fall      Mobility  Bed Mobility Overal bed mobility: Modified Independent                Transfers Overall transfer level: Modified independent Equipment used: None                Ambulation/Gait Ambulation/Gait assistance: Supervision;Min guard Ambulation Distance (Feet): 150 Feet Assistive device: None Gait Pattern/deviations: Step-through pattern;Decreased stride length;Antalgic     General Gait Details: antalgic on R, but somewha better with rest; performed head turns and nods while walking without dizziness or LOB.   Stairs            Wheelchair Mobility    Modified Rankin (Stroke Patients Only)       Balance Overall balance assessment: Needs assistance   Sitting balance-Leahy Scale: Good     Standing balance support: No upper extremity supported Standing balance-Leahy Scale: Good Standing balance comment: no UE support, no LOB,  some issues with ambulation due to knee pain               High Level Balance Comments: head turns and nods no LOB/dizziness             Pertinent Vitals/Pain Pain Assessment: Faces Faces Pain Scale: Hurts even more Pain Location: R shoulder and knees R>L soreness Pain Descriptors / Indicators: Sore;Tightness Pain Intervention(s): Repositioned;Monitored during session    Home Living Family/patient expects to be discharged to:: Private residence Living Arrangements: Other relatives(cousin) Available Help at Discharge: Family;Available PRN/intermittently Type of Home: House Home Access: Stairs to enter Entrance Stairs-Rails: None Entrance Stairs-Number of Steps: 2 Home Layout: One level Home Equipment: Cane - single point      Prior Function Level of Independence: Independent         Comments: works in Land at [employer]        Extremity/Trunk Assessment   Upper Extremity Assessment Upper Extremity Assessment: RUE deficits/detail RUE Deficits / Details: reports must have slept on it wrong, did not lift antigravity even for coordination testing    Lower Extremity Assessment Lower Extremity Assessment: Overall WFL for tasks assessed(but painful R>L)       Communication   Communication: No difficulties  Cognition Arousal/Alertness: Awake/alert Behavior During Therapy: WFL for tasks assessed/performed Overall Cognitive Status: Within Functional Limits for tasks assessed  General Comments General comments (skin integrity, edema, etc.): Educated on cervicogenic dizziness and possible helped by manual PT and process for referral.    Vestibular Assessment - 06/29/17 0001      Vestibular Assessment   General Observation  Patient relates dizziness than has been going on for 4-5 years.  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      Symptom Behavior   Type of Dizziness  Spinning    Frequency of Dizziness  intermittent    Duration of Dizziness  hour, several minutes    Aggravating Factors  Turning head quickly    Relieving Factors  Closing eyes      Occulomotor Exam   Occulomotor Alignment  Normal but has blue ring around iris    Spontaneous  Absent    Gaze-induced  Absent    Smooth Pursuits  Intact    Saccades  Intact      Vestibulo-Occular Reflex   VOR 1 Head Only (x 1 viewing)  horizontal and vertical head movements 30 sec, no dizziness, but did c/o neck pain    VOR to Slow Head Movement  Negative left;Negative right    VOR Cancellation  Normal      Positional Testing   Sidelying Test  Sidelying Right;Sidelying Left      Sidelying Right   Sidelying Right Duration  30s    Sidelying Right Symptoms  No nystagmus      Sidelying Left   Sidelying Left Duration  30s    Sidelying Left Symptoms  No nystagmus         Exercises     Assessment/Plan    PT Assessment All further PT needs can be met in the next venue of care  PT Problem List         PT Treatment Interventions      PT Goals (Current goals can be found in the Care Plan section)  Acute Rehab PT Goals PT Goal Formulation: All assessment and education complete, DC therapy    Frequency     Barriers to discharge        Co-evaluation               AM-PAC PT "6 Clicks" Daily Activity  Outcome Measure Difficulty turning over in bed (including adjusting bedclothes, sheets and blankets)?: None Difficulty moving from lying on back to sitting on the side of the bed? : None Difficulty sitting down on and standing up from a chair with arms (e.g., wheelchair, bedside commode, etc,.)?: None Help needed moving to and from a bed to chair (including a wheelchair)?: None Help needed walking in hospital room?: None Help needed climbing 3-5 steps with a railing? : A Little 6 Click Score: 23    End of Session    Activity Tolerance: Patient tolerated treatment well Patient left: with call bell/phone within reach Nurse Communication: Mobility status PT Visit Diagnosis: Dizziness and giddiness (R42)    Time: 4970-2637 PT Time Calculation (min) (ACUTE ONLY): 24 min   Charges:   PT Evaluation $PT Eval Moderate Complexity: 1 Mod PT Treatments $Neuromuscular Re-education: 8-22 mins   PT G CodesMagda Kiel, [place] [phone] 06/29/2017   Reginia Naas 06/29/2017, 11:44 AM

## 2017-06-29 NOTE — Progress Notes (Signed)
  Echocardiogram 2D Echocardiogram has been performed.  Darlina Sicilian M 06/29/2017, 2:26 PM

## 2017-06-29 NOTE — Progress Notes (Signed)
PHARMACY - PHYSICIAN COMMUNICATION CRITICAL VALUE ALERT - BLOOD CULTURE IDENTIFICATION (BCID)  Thomas [name] is an 62 y.o. male who presented to [facility] on 06/28/2017 with c/o weakness, chills and dizziness.  Azithromycin and ceftriaxone were started on admission for suspected PNA.  Name of physician (or Provider) Contacted: Quincy Simmonds  Current antibiotics: azithromycin and ceftriaxone 1gm IV q24h  Changes to prescribed antibiotics recommended:  - increase ceftriaxone dose to 2gm IV q24h - d/c azithromycin  Results for orders placed or performed during the hospital encounter of 06/28/17  Blood Culture ID Panel (Reflexed) (Collected: 06/28/2017  9:10 PM)  Result Value Ref Range   Enterococcus species NOT DETECTED NOT DETECTED   Listeria monocytogenes NOT DETECTED NOT DETECTED   Staphylococcus species NOT DETECTED NOT DETECTED   Staphylococcus aureus NOT DETECTED NOT DETECTED   Streptococcus species DETECTED (A) NOT DETECTED   Streptococcus agalactiae NOT DETECTED NOT DETECTED   Streptococcus pneumoniae DETECTED (A) NOT DETECTED   Streptococcus pyogenes NOT DETECTED NOT DETECTED   Acinetobacter baumannii NOT DETECTED NOT DETECTED   Enterobacteriaceae species NOT DETECTED NOT DETECTED   Enterobacter cloacae complex NOT DETECTED NOT DETECTED   Escherichia coli NOT DETECTED NOT DETECTED   Klebsiella oxytoca NOT DETECTED NOT DETECTED   Klebsiella pneumoniae NOT DETECTED NOT DETECTED   Proteus species NOT DETECTED NOT DETECTED   Serratia marcescens NOT DETECTED NOT DETECTED   Haemophilus influenzae NOT DETECTED NOT DETECTED   Neisseria meningitidis NOT DETECTED NOT DETECTED   Pseudomonas aeruginosa NOT DETECTED NOT DETECTED   Candida albicans NOT DETECTED NOT DETECTED   Candida glabrata NOT DETECTED NOT DETECTED   Candida krusei NOT DETECTED NOT DETECTED   Candida parapsilosis NOT DETECTED NOT DETECTED   Candida tropicalis NOT DETECTED NOT DETECTED    Lynelle Doctor 06/29/2017  2:47 PM

## 2017-06-29 NOTE — Progress Notes (Signed)
Tranferred via [facility] to [facility].

## 2017-06-29 NOTE — Progress Notes (Signed)
SLP Cancellation Note  Patient Details Name: Thomas [name] MRN: [id number] DOB: 1955-12-27   Cancelled treatment:       Reason Eval/Treat Not Completed: Patient being transferred to [facility]. Will continue efforts to complete com/cog evaluation.  Real [facility], CCC-SLP Speech Language Pathologist [phone]  Shonna Chock 06/29/2017, 1:01 PM

## 2017-06-29 NOTE — Care Management Note (Signed)
Case Management Note  Patient Details  Name: Thomas [name] MRN: [id number] Date of Birth: 1955/06/30  Subjective/Objective: 62 y/o m admitted w/chest pain.Transfer to [facility] for stroke.                    Action/Plan:Transfer to [facility]   Expected Discharge Date:  (unknown)               Expected Discharge Plan:  Acute to Acute Transfer  In-House Referral:     Discharge planning Services  CM Consult  Post Acute Care Choice:    Choice offered to:     DME Arranged:    DME Agency:     HH Arranged:    [place] Agency:  [place]  Status of Service:  In process, will continue to follow  If discussed at Long Length of Stay Meetings, dates discussed:    Additional Comments:  Dessa Phi, RN 06/29/2017, 12:26 PM

## 2017-06-29 NOTE — Consult Note (Signed)
Neurology Consultation  Reason for Consult: Stroke Referring Physician: Dr. Quincy Simmonds  CC: Dizziness, chest pain, shortness of breath  History is obtained from: Chart, patient  HPI: Thomas [name] is a 62 y.o. male past medical history of hypertension, presented to the emergency room over at [facility] on 06/28/2017 for sudden onset of weakness, chills and dizziness.  He said that he started having some fevers chills and cough with some vomiting and nausea. He also complained of left-sided chest pain.  He was admitted to [facility] and further workup was initiated. As part of the workup for his current symptoms, an MRI of the brain was done that revealed 3 acute areas of restricted diffusion in the left cerebellum. There was also evidence of old small bilateral cerebellar infarcts and old basal ganglia and thalamic infarcts. Cardiology evaluation for the chest pain revealed possible acute pericarditis, abnormal cardiac troponins.  Suspicion for an infectious pericarditis at this time.  Patient continues to complain of some chest discomfort, right shoulder and leg pain.  He also complains of fevers and chills.  He complains of generalized malaise.  Denies any shortness of breath currently. Denies any headaches or visual changes.  Says he had been feeling lightheaded or dizzy off and on for the past almost a week.  LKW: More than 24 hours ago tpa given?: no, outside the window Premorbid modified Rankin scale (mRS): 0   ROS:  ROS was performed and is negative except as noted in the HPI.    Past Medical History:  Diagnosis Date  . Hypertension    History reviewed. No pertinent family history.  Social History:   reports that he has been smoking cigarettes.  He has never used smokeless tobacco. He reports that he does not drink alcohol or use drugs.  Medications  Current Facility-Administered Medications:  .  0.9 %  sodium chloride infusion, , Intravenous, Continuous,  Eugenie Filler, MD, Last Rate: 100 mL/hr at 06/29/17 1607 .  acetaminophen (TYLENOL) tablet 650 mg, 650 mg, Oral, Q6H PRN **OR** acetaminophen (TYLENOL) suppository 650 mg, 650 mg, Rectal, Q6H PRN, Eugenie Filler, MD .  acetaminophen (TYLENOL) tablet 650 mg, 650 mg, Oral, Q4H PRN, Eugenie Filler, MD, 650 mg at 06/29/17 1457 .  aspirin EC tablet 81 mg, 81 mg, Oral, Daily, Eugenie Filler, MD, 81 mg at 06/29/17 1004 .  atorvastatin (LIPITOR) tablet 40 mg, 40 mg, Oral, q1800, Patrecia Pour, Christean Grief, MD, 40 mg at 06/29/17 1729 .  cefTRIAXone (ROCEPHIN) 2 g in sodium chloride 0.9 % 100 mL IVPB, 2 g, Intravenous, Q24H, Patrecia Pour, Christean Grief, MD, Stopped at 06/29/17 1809 .  heparin injection 5,000 Units, 5,000 Units, Subcutaneous, Q8H, Eugenie Filler, MD, 5,000 Units at 06/29/17 1457 .  ketorolac (TORADOL) 15 MG/ML injection 15 mg, 15 mg, Intravenous, Q6H PRN, Eugenie Filler, MD .  magnesium citrate solution 1 Bottle, 1 Bottle, Oral, Once PRN, Eugenie Filler, MD .  meclizine (ANTIVERT) tablet 25 mg, 25 mg, Oral, TID PRN, Eugenie Filler, MD .  nitroGLYCERIN (NITROSTAT) SL tablet 0.4 mg, 0.4 mg, Sublingual, Q5 Min x 3 PRN, Eugenie Filler, MD .  ondansetron [facility]) injection 4 mg, 4 mg, Intravenous, Q6H PRN, Eugenie Filler, MD .  oxyCODONE (Oxy IR/ROXICODONE) immediate release tablet 5 mg, 5 mg, Oral, Q4H PRN, Eugenie Filler, MD .  polyethylene glycol (MIRALAX / GLYCOLAX) packet 17 g, 17 g, Oral, Daily PRN, Eugenie Filler, MD .  [facility])  tablet 8.6 mg, 1 tablet, Oral, BID, Eugenie Filler, MD, 8.6 mg at 06/29/17 1004 .  sorbitol 70 % solution 30 mL, 30 mL, Oral, Daily PRN, Eugenie Filler, MD .  zolpidem (AMBIEN) tablet 5 mg, 5 mg, Oral, QHS PRN,MR X 1, Eugenie Filler, MD  Exam: Current vital signs: BP 131/73 (BP Location: Right Arm)   Pulse 98   Temp 99.7 F (37.6 C) (Oral)   Resp 20   Ht 5\' 5"  (1.651 m)   Wt 64 kg (141 lb 1.5 oz)   SpO2  93%   BMI 23.48 kg/m  Vital signs in last 24 hours: Temp:  [97.5 F (36.4 C)-101.9 F (38.8 C)] 99.7 F (37.6 C) (04/05 2031) Pulse Rate:  [81-99] 98 (04/05 2031) Resp:  [20-26] 20 (04/05 1952) BP: (120-136)/(67-87) 131/73 (04/05 2031) SpO2:  [93 %-100 %] 93 % (04/05 2031) Weight:  [64 kg (141 lb 1.5 oz)] 64 kg (141 lb 1.5 oz) (04/05 0500)  GENERAL: Awake, alert in NAD HEENT: - Normocephalic and atraumatic, dry mm, no LN++, no Thyromegally LUNGS - Clear to auscultation bilaterally with no wheezes CV - S1S2 RRR, no m/r/g, equal pulses bilaterally. ABDOMEN - Soft, nontender, nondistended with normoactive BS Ext: warm, well perfused, intact peripheral pulses, no edema  NEURO:  Mental Status: AA&Ox3  Language: speech is clear.  Naming, repetition, fluency, and comprehension intact. Cranial Nerves: PERRL. EOMI, visual fields full, no facial asymmetry, facial sensation intact, hearing intact, tongue/uvula/soft palate midline, normal sternocleidomastoid and trapezius muscle strength. No evidence of tongue atrophy or fibrillations Motor: Grossly 5/5 all over with the right side exam slightly limited because of pain.  No vertical drift. Tone: is normal and bulk is normal Sensation- Intact to light touch bilaterally Coordination: FTN intact bilaterally Gait- deferred  NIHSS - 0   Labs I have reviewed labs in epic and the results pertinent to this consultation are:  CBC    Component Value Date/Time   WBC 17.3 (H) 06/29/2017 0320   RBC 5.25 06/29/2017 0320   HGB 13.7 06/29/2017 0320   HCT 41.0 06/29/2017 0320   PLT 175 06/29/2017 0320   MCV 78.1 06/29/2017 0320   MCV 77.8 (A) 06/28/2017 1335   MCH 26.1 06/29/2017 0320   MCHC 33.4 06/29/2017 0320   RDW 14.4 06/29/2017 0320   LYMPHSABS 0.7 06/28/2017 1544   MONOABS 0.5 06/28/2017 1544   EOSABS 0.0 06/28/2017 1544   BASOSABS 0.0 06/28/2017 1544    CMP     Component Value Date/Time   NA 136 06/29/2017 0320   K 3.9  06/29/2017 0320   CL 106 06/29/2017 0320   CO2 23 06/29/2017 0320   GLUCOSE 105 (H) 06/29/2017 0320   BUN 23 (H) 06/29/2017 0320   CREATININE 1.64 (H) 06/29/2017 0320   CALCIUM 8.2 (L) 06/29/2017 0320   GFRNONAA 43 (L) 06/29/2017 0320   GFRAA 50 (L) 06/29/2017 0320    Lipid Panel     Component Value Date/Time   CHOL 128 06/29/2017 0320   TRIG 97 06/29/2017 0320   HDL 37 (L) 06/29/2017 0320   CHOLHDL 3.5 06/29/2017 0320   VLDL 19 06/29/2017 0320   LDLCALC 72 06/29/2017 0320   Stroke labs- - A1c 5.6 - LDL 72 2D echocardiogram showed normal LVEF with no regional wall motion abnormalities.  Mild thickening of the trileaflet aortic valve with mild calcification.  Mild aortic regurgitation. -Blood cultures drawn and pending  Imaging I have reviewed the images obtained: MRI  examination of the brain -3 punctate areas of restricted diffusion in the left cerebellum.  Old bilateral cerebellar, basal ganglia and thalamic infarcts.  Assessment:  62 year old man past history of hypertension presented to the emergency room at [facility] for sudden onset of weakness chills and fevers along with chest pain. Found to have acute pericarditis, likely infectious. Transthoracic echocardiogram unrevealing. MRI of the brain done for dizziness revealed 3 punctate areas of restricted diffusion in the left cerebellum. His presentation with symptoms of a febrile illness, chest pain and embolic appearing infarcts is concerning for a cardiac source. He has been transferred to [facility] for further stroke workup and also a possible transesophageal echo cardiogram in the coming week by cardiology.   Impression: Left cerebellar stroke-likely embolic Acute pericarditis Evaluate for infectious endocarditis HTN   Recommendations: -Telemetry -Frequent neurochecks -Aspirin and statin -Agree with transesophageal echogram -Evaluation of the blood vessels of the head and neck with CT angiogram  when renal function permits. -PT, OT, speech therapy -Patient is currently n.p.o.  I have changed the orders for the patient to be able to get dinner and he can be n.p.o. past midnight if the TEE is planned for Saturday.  If that he is not plan for until early next week, diet orders to be addressed by the primary team. -Stroke labs drawn already and as above.  Goal LDL less than 70.  Hemoglobin A1c is at goal. -Follow blood cultures - Permissive hypertension is desirable in the first 24-48 hours after stroke, but he seems to be out of that window.  This can be normalized to a goal of 140/90 or below.  Please page stroke NP/PA/MD (listed on AMION)  from 8am-4 pm as this patient will be followed by the stroke team at this point.   -- Amie Portland, MD [facility] Pager: [phone] If 7pm to 7am, please call on call as listed on AMION.

## 2017-06-29 NOTE — Progress Notes (Addendum)
Progress Note  Patient Name: Thomas [name] Date of Encounter: 06/29/2017  Primary Cardiologist: Sanda Klein, MD   Subjective   No chest pain and only SOB with chills   Inpatient Medications    Scheduled Meds: . aspirin EC  81 mg Oral Daily  . heparin  5,000 Units Subcutaneous Q8H  . senna  1 tablet Oral BID   Continuous Infusions: . sodium chloride 100 mL/hr at 06/29/17 0647  . azithromycin Stopped (06/29/17 0130)  . cefTRIAXone (ROCEPHIN)  IV Stopped (06/29/17 0036)   PRN Meds: acetaminophen **OR** acetaminophen, acetaminophen, ketorolac, magnesium citrate, meclizine, nitroGLYCERIN, ondansetron (ZOFRAN) IV, oxyCODONE, polyethylene glycol, sorbitol, zolpidem   Vital Signs    Vitals:   06/29/17 0347 06/29/17 0436 06/29/17 0500 06/29/17 0523  BP:  120/67    Pulse:  96    Resp: (!) 26 (!) 24    Temp: 99.7 F (37.6 C) (!) 101.9 F (38.8 C)  99.5 F (37.5 C)  TempSrc: Oral Oral  Oral  SpO2:  99%    Weight:   141 lb 1.5 oz (64 kg)   Height:        Intake/Output Summary (Last 24 hours) at 06/29/2017 0750 Last data filed at 06/29/2017 0650 Gross per 24 hour  Intake 2481.67 ml  Output 750 ml  Net 1731.67 ml   Filed Weights   06/28/17 2051 06/29/17 0500  Weight: 141 lb (64 kg) 141 lb 1.5 oz (64 kg)    Telemetry    SR to ST - Personally Reviewed  ECG    SR with RBBB and mild ST elevation without change - Personally Reviewed  Physical Exam   GEN: No acute distress.   Neck: No JVD Cardiac: RRR, no murmurs, rubs, or gallops.  Respiratory: bilateral breath sounds to auscultation bilaterally. Few crackles  GI: Soft, nontender, non-distended  MS: No edema; No deformity. Neuro:  Nonfocal  Psych: Normal affect   Labs    Chemistry Recent Labs  Lab 06/28/17 1544 06/29/17 0320  NA 137 136  K 3.8 3.9  CL 106 106  CO2 23 23  GLUCOSE 107* 105*  BUN 25* 23*  CREATININE 1.74* 1.64*  CALCIUM 8.4* 8.2*  GFRNONAA 40* 43*  GFRAA 46* 50*  ANIONGAP 8 7      Hematology Recent Labs  Lab 06/28/17 1335 06/28/17 1544 06/29/17 0320  WBC 21.8* 24.1* 17.3*  RBC 5.70 5.15 5.25  HGB 14.5 13.6 13.7  HCT 44.4 40.2 41.0  MCV 77.8* 78.1 78.1  MCH 25.5* 26.4 26.1  MCHC 32.8 33.8 33.4  RDW  --  14.2 14.4  PLT  --  226 175    Cardiac Enzymes Recent Labs  Lab 06/28/17 1544 06/28/17 2120 06/29/17 0320  TROPONINI 0.34* 0.45* 0.41*    Recent Labs  Lab 06/28/17 1552  TROPIPOC 0.33*     BNPNo results for input(s): BNP, PROBNP in the last 168 hours.   DDimer No results for input(s): DDIMER in the last 168 hours.   Radiology    Dg Chest 2 View  Result Date: 06/28/2017 CLINICAL DATA:  Chest pain. EXAM: CHEST - 2 VIEW COMPARISON:  No prior. FINDINGS: Thoracic aorta is tortuous. Heart size normal. Diffuse bilateral pulmonary interstitial prominence noted. Although an active interstitial process may be present changes may be secondary to chronic interstitial lung disease. A component of bronchiectasis most likely present in the lung bases. No focal alveolar infiltrate. Biapical and left-sided pleural thickening noted most likely secondary to scarring. Small left  pleural effusion may be present. IMPRESSION: 1. Interstitial lung disease possibly chronic. A component of basilar bronchiectasis most likely present in the lung bases. Pleural scarring. 2. Tortuous thoracic aorta. Heart size normal. No pulmonary venous congestion. Electronically Signed   By: Marcello Moores  Register   On: 06/28/2017 13:55   Mr Brain Wo Contrast  Result Date: 06/28/2017 CLINICAL DATA:  Vertigo, suspect stroke.  Fever and cough. EXAM: MRI HEAD WITHOUT CONTRAST TECHNIQUE: Multiplanar, multiecho pulse sequences of the brain and surrounding structures were obtained without intravenous contrast. COMPARISON:  MRI of the head June 25, 2006 FINDINGS: Mild motion degraded examination. INTRACRANIAL CONTENTS: 3 LEFT cerebellar foci of reduced diffusion measuring to 14 mm with low ADC values. No  susceptibility artifact to suggest hemorrhage. Old bilateral basal ganglia infarcts new from prior MRI. Old small bilateral basal ganglia and thalami infarcts. Mild ex vacuo dilatation subjacent lateral ventricle. No parenchymal brain volume loss for age. No hydrocephalus. No suspicious parenchymal signal, masses, mass effect. No abnormal extra-axial fluid collections. No extra-axial masses. VASCULAR: Normal major intracranial vascular flow voids present at skull base. SKULL AND UPPER CERVICAL SPINE: No abnormal sellar expansion. No suspicious calvarial bone marrow signal. Craniocervical junction maintained. SINUSES/ORBITS: Mild paranasal sinus mucosal thickening without air-fluid levels. Mastoid air cells are well aerated.The included ocular globes and orbital contents are non-suspicious. OTHER: None. IMPRESSION: 1. Three acute nonhemorrhagic small LEFT cerebellar infarcts. 2. Old small bilateral cerebellar infarcts. Old basal ganglia and thalamus infarcts. Electronically Signed   By: Elon Alas M.D.   On: 06/28/2017 21:16    Cardiac Studies   ECHO- P  Patient Profile     62 y.o. male , + smoker, without a history of other cardiac disease or risk factors, but admitted 06/27/17 upper chest discomfort, then chills and fever in ER with abnormal EKG.   Assessment & Plan    Abnormal EKG with RBBB-old, and ST elevation across ant leads.   (T chol 128, HDL 37 and LDL 72)   Probable acute pericarditis --troponin 0.34;0.33;0.45;0.41--flat.    + CVA with 3 acute nonhemorrhagic small Lt cerebellar infarcts seen on MRI  Echo ordered but may need TEE   Possible PNA blood cultures X 2 , on ABX  (u/A + strep pneumo antigen)  AKI --Cr 1.64 today   Leukocytosis down to 17.3 today   hypomagnesium up to 2.5 from 1.5  For questions or updates, please contact [facility] Please consult [email or web address] for contact info under Cardiology/STEMI.      Signed, Cecilie Kicks, NP  06/29/2017, 7:50 AM    I  have seen and examined the patient along with Cecilie Kicks, NP .  I have reviewed the chart, notes and new data.  I agree with PA/NP's note.  Key new complaints: chills are his only current complaint. Denies recent dental or surgical procedures, obvious skin infections, travel, etc. Key examination changes: no murmur, no rub; widely split S2 of RBBB; no rash/nodules or other peripheral SBE stigmata Key new findings / data: improved WBC. Acute cerebellar strokes and old bilateral basal ganglia ischemic strokes, suggesting embolic mechanism  PLAN: Hard to tie together the acute febrile illness with acute pericarditis and the embolic strokes. If he had endocarditis, would expect the febrile disease to precede embolic events by days or weeks. Nevertheless, that needs to be explored. Still waiting for echo. Blood cultures incubating. Will tentatively schedule for TEE early next week as well. No reason to give NSAIDs for pericarditis - symptoms resolved and ECG  changes have improved.  Sanda Klein, MD, [place] [phone] 06/29/2017, 12:44 PM

## 2017-06-29 NOTE — Progress Notes (Signed)
SLP Cancellation Note  Patient Details Name: Thomas [name] MRN: [id number] DOB: 1955-04-09   Cancelled treatment:       Reason Eval/Treat Not Completed: Pain limiting ability to participate. Initially pt indicated he could complete cog/com eval, however, pt stopped testing, stating he was unable to complete eval due to pain and chills. Will continue efforts.  Celia B. Quentin Ore [facility], CCC-SLP Speech Language Pathologist [phone]  Shonna Chock 06/29/2017, 2:45 PM

## 2017-06-29 NOTE — Progress Notes (Signed)
CRITICAL VALUE ALERT  Critical Value:  Troponin 0.41  Date & Time Notied: 06/29/17 0455  Provider Notified: R.Smith  Orders Received/Actions taken: no new orders at this time

## 2017-06-30 ENCOUNTER — Inpatient Hospital Stay (HOSPITAL_COMMUNITY): Payer: Private Health Insurance - Indemnity

## 2017-06-30 DIAGNOSIS — G939 Disorder of brain, unspecified: Secondary | ICD-10-CM

## 2017-06-30 DIAGNOSIS — I319 Disease of pericardium, unspecified: Secondary | ICD-10-CM

## 2017-06-30 DIAGNOSIS — I4510 Unspecified right bundle-branch block: Secondary | ICD-10-CM

## 2017-06-30 DIAGNOSIS — K5900 Constipation, unspecified: Secondary | ICD-10-CM

## 2017-06-30 DIAGNOSIS — N183 Chronic kidney disease, stage 3 (moderate): Secondary | ICD-10-CM

## 2017-06-30 DIAGNOSIS — B953 Streptococcus pneumoniae as the cause of diseases classified elsewhere: Secondary | ICD-10-CM

## 2017-06-30 DIAGNOSIS — F17210 Nicotine dependence, cigarettes, uncomplicated: Secondary | ICD-10-CM

## 2017-06-30 DIAGNOSIS — I639 Cerebral infarction, unspecified: Secondary | ICD-10-CM | POA: Diagnosis present

## 2017-06-30 DIAGNOSIS — B955 Unspecified streptococcus as the cause of diseases classified elsewhere: Secondary | ICD-10-CM | POA: Diagnosis present

## 2017-06-30 DIAGNOSIS — R7881 Bacteremia: Secondary | ICD-10-CM

## 2017-06-30 DIAGNOSIS — I6300 Cerebral infarction due to thrombosis of unspecified precerebral artery: Secondary | ICD-10-CM

## 2017-06-30 LAB — BASIC METABOLIC PANEL
Anion gap: 12 (ref 5–15)
BUN: 19 mg/dL (ref 6–20)
CO2: 15 mmol/L — ABNORMAL LOW (ref 22–32)
CREATININE: 1.3 mg/dL — AB (ref 0.61–1.24)
Calcium: 7.9 mg/dL — ABNORMAL LOW (ref 8.9–10.3)
Chloride: 105 mmol/L (ref 101–111)
GFR, EST NON AFRICAN AMERICAN: 57 mL/min — AB (ref >60)
Glucose, Bld: 102 mg/dL — ABNORMAL HIGH (ref 65–99)
POTASSIUM: 3.7 mmol/L (ref 3.5–5.1)
SODIUM: 132 mmol/L — AB (ref 135–145)

## 2017-06-30 LAB — CBC WITH DIFFERENTIAL/PLATELET
BLASTS: 0 %
Band Neutrophils: 0 %
Basophils Absolute: 0 10*3/uL (ref 0.0–0.1)
Basophils Relative: 0 %
EOS PCT: 0 %
Eosinophils Absolute: 0 10*3/uL (ref 0.0–0.7)
HEMATOCRIT: 37.9 % — AB (ref 39.0–52.0)
Hemoglobin: 12.9 g/dL — ABNORMAL LOW (ref 13.0–17.0)
Lymphocytes Relative: 21 %
Lymphs Abs: 3.9 10*3/uL (ref 0.7–4.0)
MCH: 26.1 pg (ref 26.0–34.0)
MCHC: 34 g/dL (ref 30.0–36.0)
MCV: 76.7 fL — AB (ref 78.0–100.0)
MONO ABS: 0.6 10*3/uL (ref 0.1–1.0)
MONOS PCT: 3 %
MYELOCYTES: 0 %
Metamyelocytes Relative: 0 %
NEUTROS PCT: 76 %
NRBC: 0 /100{WBCs}
Neutro Abs: 14.2 10*3/uL — ABNORMAL HIGH (ref 1.7–7.7)
PLATELETS: 127 10*3/uL — AB (ref 150–400)
Promyelocytes Relative: 0 %
RBC: 4.94 MIL/uL (ref 4.22–5.81)
RDW: 14.6 % (ref 11.5–15.5)
WBC Morphology: INCREASED
WBC: 18.7 10*3/uL — AB (ref 4.0–10.5)

## 2017-06-30 LAB — GLUCOSE, CAPILLARY: GLUCOSE-CAPILLARY: 99 mg/dL (ref 65–99)

## 2017-06-30 LAB — MAGNESIUM: Magnesium: 1.9 mg/dL (ref 1.7–2.4)

## 2017-06-30 LAB — LEGIONELLA PNEUMOPHILA SEROGP 1 UR AG: L. pneumophila Serogp 1 Ur Ag: NEGATIVE

## 2017-06-30 MED ORDER — SODIUM CHLORIDE 0.9 % IV SOLN
2.0000 g | Freq: Two times a day (BID) | INTRAVENOUS | Status: DC
  Administered 2017-06-30 – 2017-07-14 (×29): 2 g via INTRAVENOUS
  Filled 2017-06-30 (×30): qty 20

## 2017-06-30 MED ORDER — GADOBENATE DIMEGLUMINE 529 MG/ML IV SOLN
15.0000 mL | Freq: Once | INTRAVENOUS | Status: AC | PRN
  Administered 2017-06-30: 13 mL via INTRAVENOUS

## 2017-06-30 NOTE — Evaluation (Signed)
Occupational Therapy Evaluation Patient Details Name: Thomas [name] MRN: [id number] DOB: 1955-05-26 Today's Date: 06/30/2017    History of Present Illness Thomas [name] is a 62 y.o. male with medical history significant of ongoing tobacco abuse otherwise unremarkable presented to the ED with sudden onset of subjective fevers, chills, diaphoresis, nonproductive cough, N&V and some EKG changes.  R/o MI, but possible endocarditis.   Clinical Impression   Pt admitted with the above diagnoses and presents with below problem list. Pt will benefit from continued acute OT to address the below listed deficits and maximize independence with basic ADLs prior to d/c home. PTA pt was independent with ADLs and works as a Presenter, [employer]. Pt is setup to min guard with ADLs and functional mobility/transfers. Discussed strategies for managing ADLs with dizziness. Will follow acutely.       Follow Up Recommendations  No OT follow up    Equipment Recommendations  None recommended by OT;Other (comment)(discussed installing grab bars in shower)    Recommendations for Other Services       Precautions / Restrictions Precautions Precautions: Fall Restrictions Weight Bearing Restrictions: No      Mobility Bed Mobility Overal bed mobility: Modified Independent                Transfers Overall transfer level: Modified independent Equipment used: None                  Balance Overall balance assessment: Needs assistance Sitting-balance support: No upper extremity supported;Feet supported Sitting balance-Leahy Scale: Good     Standing balance support: No upper extremity supported Standing balance-Leahy Scale: Good Standing balance comment: no UE support, no LOB, some issues with ambulation due to knee pain                           ADL either performed or assessed with clinical judgement   ADL Overall ADL's : Needs assistance/impaired Eating/Feeding: Set up;Sitting   Grooming:  Set up;Sitting   Upper Body Bathing: Set up;Sitting   Lower Body Bathing: Sit to/from stand;Min guard   Upper Body Dressing : Set up;Sitting   Lower Body Dressing: Min guard;Sit to/from stand   Toilet Transfer: Supervision/safety;Ambulation   Toileting- Clothing Manipulation and Hygiene: Supervision/safety;Set up;Sitting/lateral lean;Sit to/from stand   Tub/ Shower Transfer: Tub transfer;Min guard;Ambulation   Functional mobility during ADLs: Supervision/safety General ADL Comments: Pt finishing oral care standing at sink on OT arrival. Discussed strategies for managing ADLs safely with dizziness.      Vision Patient Visual Report: No change from baseline       Perception     Praxis      Pertinent Vitals/Pain Pain Assessment: Faces Pain Score: 8  Faces Pain Scale: Hurts even more Pain Location: Stomach Pain Descriptors / Indicators: Cramping Pain Intervention(s): Monitored during session;Limited activity within patient's tolerance     Hand Dominance Right   Extremity/Trunk Assessment Upper Extremity Assessment Upper Extremity Assessment: Overall WFL for tasks assessed   Lower Extremity Assessment Lower Extremity Assessment: Defer to PT evaluation       Communication     Cognition Arousal/Alertness: Awake/alert Behavior During Therapy: WFL for tasks assessed/performed Overall Cognitive Status: Within Functional Limits for tasks assessed                                     General Comments  Exercises     Shoulder Instructions      Home Living Family/patient expects to be discharged to:: Private residence Living Arrangements: Other relatives Available Help at Discharge: Family;Friend(s);Available PRN/intermittently Type of Home: House Home Access: Stairs to enter [employer] of Steps: 2 Entrance Stairs-Rails: None Home Layout: One level     Bathroom Shower/Tub: Tub/shower unit         Home Equipment: Kasandra Knudsen -  single point          Prior Functioning/Environment Level of Independence: Independent        Comments: works in [place] at [place] List: Decreased activity tolerance;Impaired balance (sitting and/or standing);Decreased knowledge of use of DME or AE;Decreased knowledge of precautions      OT Treatment/Interventions: Self-care/ADL training;DME and/or AE instruction;Therapeutic activities;Patient/family education;Balance training    OT Goals(Current goals can be found in the care plan section) Acute Rehab OT Goals Patient Stated Goal: home, back to normal OT Goal Formulation: With patient Potential to Achieve Goals: Good ADL Goals Pt Will Perform Grooming: Independently;standing Pt Will Perform Tub/Shower Transfer: Tub transfer;Independently;ambulating  OT Frequency: Min 2X/week   Barriers to D/C:            Co-evaluation              AM-PAC PT "6 Clicks" Daily Activity     Outcome Measure Help from another person eating meals?: None Help from another person taking care of personal grooming?: None Help from another person toileting, which includes using toliet, bedpan, or urinal?: None Help from another person bathing (including washing, rinsing, drying)?: A Little Help from another person to put on and taking off regular upper body clothing?: None Help from another person to put on and taking off regular lower body clothing?: None 6 Click Score: 23   End of Session    Activity Tolerance: Patient tolerated treatment well Patient left: in bed;with call bell/phone within reach;with family/visitor present  OT Visit Diagnosis: Unsteadiness on feet (R26.81)                Time: 7341-9379 OT Time Calculation (min): 9 min Charges:  OT General Charges $OT Visit: 1 Visit OT Evaluation $OT Eval Low Complexity: 1 Low G-Codes:       Hortencia Pilar 06/30/2017, 4:11 PM

## 2017-06-30 NOTE — Consult Note (Signed)
[name]    Date of Admission:  06/28/2017   Total days of antibiotics: 2 ceftriaxone               Reason for Consult: Pneumococcal bacteremia with dissemination    Referring Provider: Thompson   Assessment: Penumococcal bacteremia Pericarditis CNS lesion CKD 3  Plan: 1. Repeat BCx 2. Change ceftriaxone to 2g q12h (meningitis dose) due to possible CNS involvement 3. Await TEE 4. Watch his Cr (ceftriaxone is hepatically metabolized).   Comment- His TTE shows normal LV function. Await further studies to confirm if this is IE.     Thank you so much for this interesting consult,  Principal Problem:   Acute pericarditis: Probable Active Problems:   Chest pain   CAP (community acquired pneumonia): Clinical   Acute renal failure superimposed on stage 3 chronic kidney disease (HCC)   Dehydration   Leukocytosis   Elevated troponin   Dizziness   Vertigo   Bacteremia due to Streptococcus   CVA (cerebral vascular accident) ([place])   . aspirin EC  81 mg Oral Daily  . atorvastatin  40 mg Oral q1800  . heparin  5,000 Units Subcutaneous Q8H  . senna  1 tablet Oral BID    HPI: Thomas [name] is a 62 y.o. male with hx of tobacco use, adm on 4-4 with 24h f/c, productive cough and n/v. He was found to have WBC of 24.1, Troponin .33. His ECG shoed RBB with ST elevation in anterior leads. He was eval by CV and was felt to have infectious pericarditis perhaps related to LLL pneumonia.  He was started on ceftriaxone and azithro.  He also complained of vertigo and underwent MRI showing 3 areas of "restricted diffusion" in L cerebellar infarct.  His BCx have now showed Strep pneumo 2/2.   Tm 101.9 in hospital  Today feels poorly, c/o abd pain. No Bm since adm, has not eaten.   Review of Systems: Review of Systems  Constitutional: Positive for chills and fever.  Respiratory: Negative for cough and shortness of breath.   Cardiovascular: Negative for  chest pain.  Gastrointestinal: Positive for abdominal pain and constipation. Negative for diarrhea.  Genitourinary: Negative for dysuria.  Please see HPI. All other systems reviewed and negative.   Past Medical History:  Diagnosis Date  . Hypertension     Social History   Tobacco Use  . Smoking status: Current Every Day Smoker    Types: Cigarettes  . Smokeless tobacco: Never Used  Substance Use Topics  . Alcohol use: No  . Drug use: No    History reviewed. No pertinent family history.   Medications:  Scheduled: . aspirin EC  81 mg Oral Daily  . atorvastatin  40 mg Oral q1800  . heparin  5,000 Units Subcutaneous Q8H  . senna  1 tablet Oral BID    Abtx:  Anti-infectives (From admission, onward)   Start     Dose/Rate Route Frequency Ordered Stop   06/29/17 2200  azithromycin (ZITHROMAX) tablet 500 mg  Status:  Discontinued     500 mg Oral Daily at bedtime 06/29/17 1159 06/29/17 1454   06/29/17 1500  cefTRIAXone (ROCEPHIN) 2 g in sodium chloride 0.9 % 100 mL IVPB     2 g 200 mL/hr over 30 Minutes Intravenous Every 24 hours 06/29/17 1454     06/28/17 2000  cefTRIAXone (ROCEPHIN) 1 g in sodium chloride 0.9 % 100 mL IVPB  Status:  Discontinued     1 g 200 mL/hr over 30 Minutes Intravenous Every 24 hours 06/28/17 1839 06/29/17 1454   06/28/17 2000  azithromycin (ZITHROMAX) 500 mg in sodium chloride 0.9 % 250 mL IVPB  Status:  Discontinued     500 mg 250 mL/hr over 60 Minutes Intravenous Every 24 hours 06/28/17 1839 06/29/17 1159        OBJECTIVE: Blood pressure 115/79, pulse 75, temperature 98.2 F (36.8 C), temperature source Oral, resp. rate 18, height _0  (1.651 m), weight 64 kg (141 lb 1.5 oz), SpO2 100 %.  Physical Exam  Constitutional: He is oriented to person, place, and time. He appears distressed.  HENT:  Mouth/Throat: No oropharyngeal exudate.  Eyes: Pupils are equal, round, and reactive to light. EOM are normal.  Neck: Neck supple.  Cardiovascular:  Normal rate, regular rhythm and normal heart sounds.  Pulmonary/Chest: Effort normal. He has decreased breath sounds.  Musculoskeletal: Normal range of motion. He exhibits no edema.  Lymphadenopathy:    He has no cervical adenopathy.  Neurological: He is alert and oriented to person, place, and time.  Skin: He is not diaphoretic.       Lab Results Results for orders placed or performed during the hospital encounter of 06/28/17 (from the past 48 hour(s))  Basic metabolic panel     Status: Abnormal   Collection Time: 06/28/17  3:44 PM  Result Value Ref Range   Sodium 137 135 - 145 mmol/L   Potassium 3.8 3.5 - 5.1 mmol/L   Chloride 106 101 - 111 mmol/L   CO2 23 22 - 32 mmol/L   Glucose, Bld 107 (H) 65 - 99 mg/dL   BUN 25 (H) 6 - 20 mg/dL   Creatinine, Ser 1.74 (H) 0.61 - 1.24 mg/dL   Calcium 8.4 (L) 8.9 - 10.3 mg/dL   GFR calc non Af Amer 40 (L) >60 mL/min   GFR calc Af Amer 46 (L) >60 mL/min    Comment: (NOTE) The eGFR has been calculated using the CKD EPI equation. This calculation has not been validated in all clinical situations. eGFR's persistently <60 mL/min signify possible Chronic Kidney Disease.    Anion gap 8 5 - 15    Comment: Performed at [facility], [place] [address] [place], [place] [address]  CBC     Status: Abnormal   Collection Time: 06/28/17  3:44 PM  Result Value Ref Range   WBC 24.1 (H) 4.0 - 10.5 K/uL   RBC 5.15 4.22 - 5.81 MIL/uL   Hemoglobin 13.6 13.0 - 17.0 g/dL   HCT 40.2 39.0 - 52.0 %   MCV 78.1 78.0 - 100.0 fL   MCH 26.4 26.0 - 34.0 pg   MCHC 33.8 30.0 - 36.0 g/dL   RDW 14.2 11.5 - 15.5 %   Platelets 226 150 - 400 K/uL    Comment: Performed at [facility], [place] [address], [place], [place] [address]  Troponin I (q 6hr x 3)     Status: Abnormal   Collection Time: 06/28/17  3:44 PM  Result Value Ref Range   Troponin I 0.34 (HH) <0.03 ng/mL    Comment: CRITICAL RESULT CALLED TO, READ BACK BY AND VERIFIED  WITH: BRAWNER,M. RN _1  ON 04.04.19 BY COHEN,K Performed at [facility], [place] [address], [place], [place] [address]   Magnesium     Status: Abnormal   Collection Time: 06/28/17  3:44 PM  Result Value Ref Range  Magnesium 1.4 (L) 1.7 - 2.4 mg/dL    Comment: Performed at [facility], [place] [address] [place], [place] [address]  Differential     Status: Abnormal   Collection Time: 06/28/17  3:44 PM  Result Value Ref Range   Neutrophils Relative % 95 %   Lymphocytes Relative 3 %   Monocytes Relative 2 %   Eosinophils Relative 0 %   Basophils Relative 0 %   Neutro Abs 22.9 (H) 1.7 - 7.7 K/uL   Lymphs Abs 0.7 0.7 - 4.0 K/uL   Monocytes Absolute 0.5 0.1 - 1.0 K/uL   Eosinophils Absolute 0.0 0.0 - 0.7 K/uL   Basophils Absolute 0.0 0.0 - 0.1 K/uL   WBC Morphology MILD LEFT SHIFT (1-5% METAS, OCC MYELO, OCC BANDS)     Comment: Performed at [facility], [place] [address], [place], [place] [address]  I-Stat Troponin, ED (not at [facility])     Status: Abnormal   Collection Time: 06/28/17  3:52 PM  Result Value Ref Range   Troponin i, poc 0.33 (HH) 0.00 - 0.08 ng/mL   Comment NOTIFIED PHYSICIAN    Comment 3            Comment: Due to the release kinetics of cTnI, a negative result within the first hours of the onset of symptoms does not rule out myocardial infarction with certainty. If myocardial infarction is still suspected, repeat the test at appropriate intervals.   CBG monitoring, ED     Status: Abnormal   Collection Time: 06/28/17  4:25 PM  Result Value Ref Range   Glucose-Capillary 115 (H) 65 - 99 mg/dL   Comment 1 Notify RN    Comment 2 Document in Chart   Urinalysis, Routine w reflex microscopic     Status: Abnormal   Collection Time: 06/28/17  7:01 PM  Result Value Ref Range   Color, Urine YELLOW YELLOW   APPearance CLEAR CLEAR   Specific Gravity, Urine 1.013 1.005 - 1.030   pH 7.0 5.0 - 8.0   Glucose, UA NEGATIVE  NEGATIVE mg/dL   Hgb urine dipstick SMALL (A) NEGATIVE   Bilirubin Urine NEGATIVE NEGATIVE   Ketones, ur NEGATIVE NEGATIVE mg/dL   Protein, ur 30 (A) NEGATIVE mg/dL   Nitrite NEGATIVE NEGATIVE   Leukocytes, UA NEGATIVE NEGATIVE   RBC / HPF 0-5 0 - 5 RBC/hpf   WBC, UA NONE SEEN 0 - 5 WBC/hpf   Bacteria, UA NONE SEEN NONE SEEN   Squamous Epithelial / LPF NONE SEEN NONE SEEN   Mucus PRESENT     Comment: Performed at [facility], [place] [address] [place], [place] [address]  Sodium, urine, random     Status: None   Collection Time: 06/28/17  7:01 PM  Result Value Ref Range   Sodium, Ur 89 mmol/L    Comment: Performed at [facility], [place] [address], [place], [place] [address]  Creatinine, urine, random     Status: None   Collection Time: 06/28/17  7:01 PM  Result Value Ref Range   Creatinine, Urine 100.46 mg/dL    Comment: Performed at [facility], [place] [address], [place], [place] [address]  Strep pneumoniae urinary antigen     Status: Abnormal   Collection Time: 06/28/17  7:02 PM  Result Value Ref Range   Strep Pneumo Urinary Antigen POSITIVE (A) NEGATIVE  Culture, blood (Routine X 2) w Reflex to ID Panel     Status:  None (Preliminary result)   Collection Time: 06/28/17  9:10 PM  Result Value Ref Range   Specimen Description      BLOOD RIGHT ANTECUBITAL Performed at [place] [address] [place], [place] [address]    Special Requests      BOTTLES DRAWN AEROBIC AND ANAEROBIC Blood Culture adequate volume Performed at [place] [address], [place], [place] [address]    Culture  Setup Time      GRAM POSITIVE COCCI IN CHAINS IN BOTH AEROBIC AND ANAEROBIC BOTTLES CRITICAL VALUE NOTED.  VALUE IS CONSISTENT WITH PREVIOUSLY REPORTED AND CALLED VALUE. Performed at [facility], [place] [address], [place], [place] [address]    Culture GRAM POSITIVE COCCI IN CHAINS     Report Status PENDING   Culture, blood (Routine X 2) w Reflex to ID Panel     Status: Abnormal (Preliminary result)   Collection Time: 06/28/17  9:10 PM  Result Value Ref Range   Specimen Description      BLOOD LEFT HAND Performed at [place] [address] [place], [place] [address]    Special Requests      BOTTLES DRAWN AEROBIC AND ANAEROBIC Blood Culture adequate volume Performed at [place] [address], [place], [place] [address]    Culture  Setup Time      GRAM POSITIVE COCCI IN CHAINS IN BOTH AEROBIC AND ANAEROBIC BOTTLES CRITICAL RESULT CALLED TO, READ BACK BY AND VERIFIED WITH: D. Wofford PharmD 14:45 06/29/17 ([email or web address])    Culture (A)     STREPTOCOCCUS PNEUMONIAE SUSCEPTIBILITIES TO FOLLOW Performed at [facility], [place] [address] [place], [place] [address]    Report Status PENDING   HIV antibody (Routine Screening)     Status: None   Collection Time: 06/28/17  9:10 PM  Result Value Ref Range   HIV Screen 4th Generation wRfx Non Reactive Non Reactive    Comment: (NOTE) Performed At: [facility] [address] [place], [place] [id number] Rush Farmer MD [id number] Performed at [facility], [place] [address] [place], [place] [address]   TSH     Status: None   Collection Time: 06/28/17  9:10 PM  Result Value Ref Range   TSH 0.471 0.350 - 4.500 uIU/mL    Comment: Performed by a 3rd Generation assay with a functional sensitivity of <=0.01 uIU/mL. Performed at [facility], [place] [address] [place], [place] [address]   Magnesium     Status: Abnormal   Collection Time: 06/28/17  9:10 PM  Result Value Ref Range   Magnesium 1.5 (L) 1.7 - 2.4 mg/dL    Comment: Performed at [facility], [place] [address], [place], [place] [address]  Blood Culture ID Panel (Reflexed)     Status: Abnormal   Collection Time: 06/28/17  9:10 PM  Result Value Ref Range    Enterococcus species NOT DETECTED NOT DETECTED   Listeria monocytogenes NOT DETECTED NOT DETECTED   Staphylococcus species NOT DETECTED NOT DETECTED   Staphylococcus aureus NOT DETECTED NOT DETECTED   Streptococcus species DETECTED (A) NOT DETECTED    Comment: CRITICAL RESULT CALLED TO, READ BACK BY AND VERIFIED WITH: D. Wofford PharmD 14:45 06/29/17 (wilsonm)    Streptococcus agalactiae NOT DETECTED NOT DETECTED   Streptococcus pneumoniae DETECTED (A) NOT DETECTED    Comment: CRITICAL RESULT CALLED TO, READ BACK BY AND VERIFIED WITH: D. Wofford PharmD 14:45 06/29/17 (wilsonm)  Streptococcus pyogenes NOT DETECTED NOT DETECTED   Acinetobacter baumannii NOT DETECTED NOT DETECTED   Enterobacteriaceae species NOT DETECTED NOT DETECTED   Enterobacter cloacae complex NOT DETECTED NOT DETECTED   Escherichia coli NOT DETECTED NOT DETECTED   Klebsiella oxytoca NOT DETECTED NOT DETECTED   Klebsiella pneumoniae NOT DETECTED NOT DETECTED   Proteus species NOT DETECTED NOT DETECTED   Serratia marcescens NOT DETECTED NOT DETECTED   Haemophilus influenzae NOT DETECTED NOT DETECTED   Neisseria meningitidis NOT DETECTED NOT DETECTED   Pseudomonas aeruginosa NOT DETECTED NOT DETECTED   Candida albicans NOT DETECTED NOT DETECTED   Candida glabrata NOT DETECTED NOT DETECTED   Candida krusei NOT DETECTED NOT DETECTED   Candida parapsilosis NOT DETECTED NOT DETECTED   Candida tropicalis NOT DETECTED NOT DETECTED    Comment: Performed at [facility], [place] [address], [place], [place] [address]  Troponin I     Status: Abnormal   Collection Time: 06/28/17  9:20 PM  Result Value Ref Range   Troponin I 0.45 (HH) <0.03 ng/mL    Comment: CRITICAL VALUE NOTED.  VALUE IS CONSISTENT WITH PREVIOUSLY REPORTED AND CALLED VALUE. Performed at [facility], [place] [address], [place], [place] [address]   Troponin I (q 6hr x 3)     Status: Abnormal   Collection Time: 06/29/17  3:20 AM  Result  Value Ref Range   Troponin I 0.41 (HH) <0.03 ng/mL    Comment: CRITICAL VALUE NOTED.  VALUE IS CONSISTENT WITH PREVIOUSLY REPORTED AND CALLED VALUE. Performed at [facility], [place] [address] [place], [place] [address]   Magnesium     Status: Abnormal   Collection Time: 06/29/17  3:20 AM  Result Value Ref Range   Magnesium 2.5 (H) 1.7 - 2.4 mg/dL    Comment: Performed at [facility], [place] [address], [place], [place] [address]  Lipid panel     Status: Abnormal   Collection Time: 06/29/17  3:20 AM  Result Value Ref Range   Cholesterol 128 0 - 200 mg/dL   Triglycerides 97 <150 mg/dL   HDL 37 (L) >40 mg/dL   Total CHOL/HDL Ratio 3.5 RATIO   VLDL 19 0 - 40 mg/dL   LDL Cholesterol 72 0 - 99 mg/dL    Comment:        Total Cholesterol/HDL:CHD Risk Coronary Heart Disease Risk Table                     Men   Women  1/2 Average Risk   3.4   3.3  Average Risk       5.0   4.4  2 X Average Risk   9.6   7.1  3 X Average Risk  23.4   11.0        Use the calculated Patient Ratio above and the CHD Risk Table to determine the patient's CHD Risk.        ATP III CLASSIFICATION (LDL):  <100     mg/dL   Optimal  100-129  mg/dL   Near or Above                    Optimal  130-159  mg/dL   Borderline  160-189  mg/dL   High  >190     mg/dL   Very High Performed at [place] [address] [place],  [address]   Hemoglobin A1c  Status: None   Collection Time: 06/29/17  3:20 AM  Result Value Ref Range   Hgb A1c MFr Bld 5.6 4.8 - 5.6 %    Comment: (NOTE) Pre diabetes:          5.7%-6.4% Diabetes:              >6.4% Glycemic control for   <7.0% adults with diabetes    Mean Plasma Glucose 114.02 mg/dL    Comment: Performed at [place] [address] [place], [place] [address]  Basic metabolic panel     Status: Abnormal   Collection Time: 06/29/17  3:20 AM  Result Value Ref Range   Sodium 136 135 - 145 mmol/L     Potassium 3.9 3.5 - 5.1 mmol/L   Chloride 106 101 - 111 mmol/L   CO2 23 22 - 32 mmol/L   Glucose, Bld 105 (H) 65 - 99 mg/dL   BUN 23 (H) 6 - 20 mg/dL   Creatinine, Ser 1.64 (H) 0.61 - 1.24 mg/dL   Calcium 8.2 (L) 8.9 - 10.3 mg/dL   GFR calc non Af Amer 43 (L) >60 mL/min   GFR calc Af Amer 50 (L) >60 mL/min    Comment: (NOTE) The eGFR has been calculated using the CKD EPI equation. This calculation has not been validated in all clinical situations. eGFR's persistently <60 mL/min signify possible Chronic Kidney Disease.    Anion gap 7 5 - 15    Comment: Performed at [facility], [place] [address] [place], [place] [address]  CBC     Status: Abnormal   Collection Time: 06/29/17  3:20 AM  Result Value Ref Range   WBC 17.3 (H) 4.0 - 10.5 K/uL   RBC 5.25 4.22 - 5.81 MIL/uL   Hemoglobin 13.7 13.0 - 17.0 g/dL   HCT 41.0 39.0 - 52.0 %   MCV 78.1 78.0 - 100.0 fL   MCH 26.1 26.0 - 34.0 pg   MCHC 33.4 30.0 - 36.0 g/dL   RDW 14.4 11.5 - 15.5 %   Platelets 175 150 - 400 K/uL    Comment: Performed at [facility], [place] [address], [place], [place] [address]  Protime-INR     Status: Abnormal   Collection Time: 06/29/17  3:20 AM  Result Value Ref Range   Prothrombin Time 15.4 (H) 11.4 - 15.2 seconds   INR 1.23     Comment: Performed at [facility], [place] [address] [place], [place] [address]  APTT     Status: Abnormal   Collection Time: 06/29/17  3:20 AM  Result Value Ref Range   aPTT 47 (H) 24 - 36 seconds    Comment:        IF BASELINE aPTT IS ELEVATED, SUGGEST PATIENT RISK ASSESSMENT BE USED TO DETERMINE APPROPRIATE ANTICOAGULANT THERAPY. Performed at [facility], [place] [address], [place], [place] [address]   Glucose, capillary     Status: None   Collection Time: 06/29/17  8:13 AM  Result Value Ref Range   Glucose-Capillary 90 65 - 99 mg/dL  Troponin I (q 6hr x 3)     Status: Abnormal   Collection Time:  06/29/17  9:04 AM  Result Value Ref Range   Troponin I 0.34 (HH) <0.03 ng/mL    Comment: CRITICAL VALUE NOTED.  VALUE IS CONSISTENT WITH PREVIOUSLY REPORTED AND CALLED VALUE. Performed at [facility], [place] [address], [place], [place] [address]  Procalcitonin - Baseline     Status: None   Collection Time: 06/29/17  9:28 PM  Result Value Ref Range   Procalcitonin 39.90 ng/mL    Comment:        Interpretation: PCT >= 10 ng/mL: Important systemic inflammatory response, almost exclusively due to severe bacterial sepsis or septic shock. (NOTE)       Sepsis PCT Algorithm           Lower Respiratory Tract                                      Infection PCT Algorithm    ----------------------------     ----------------------------         PCT < 0.25 ng/mL                PCT < 0.10 ng/mL         Strongly encourage             Strongly discourage   discontinuation of antibiotics    initiation of antibiotics    ----------------------------     -----------------------------       PCT 0.25 - 0.50 ng/mL            PCT 0.10 - 0.25 ng/mL               OR       >80% decrease in PCT            Discourage initiation of                                            antibiotics      Encourage discontinuation           of antibiotics    ----------------------------     -----------------------------         PCT >= 0.50 ng/mL              PCT 0.26 - 0.50 ng/mL                AND       <80% decrease in PCT             Encourage initiation of                                             antibiotics       Encourage continuation           of antibiotics    ----------------------------     -----------------------------        PCT >= 0.50 ng/mL                  PCT > 0.50 ng/mL               AND         increase in PCT                  Strongly encourage  initiation of antibiotics    Strongly encourage escalation           of antibiotics                                      -----------------------------                                           PCT <= 0.25 ng/mL                                                 OR                                        > 80% decrease in PCT                                     Discontinue / Do not initiate                                             antibiotics Performed at [facility], [address], [place], [place] [address]   Basic metabolic panel     Status: Abnormal   Collection Time: 06/30/17  3:50 AM  Result Value Ref Range   Sodium 132 (L) 135 - 145 mmol/L   Potassium 3.7 3.5 - 5.1 mmol/L   Chloride 105 101 - 111 mmol/L   CO2 15 (L) 22 - 32 mmol/L   Glucose, Bld 102 (H) 65 - 99 mg/dL   BUN 19 6 - 20 mg/dL   Creatinine, Ser 1.30 (H) 0.61 - 1.24 mg/dL   Calcium 7.9 (L) 8.9 - 10.3 mg/dL   GFR calc non Af Amer 57 (L) >60 mL/min   GFR calc Af Amer >60 >60 mL/min    Comment: (NOTE) The eGFR has been calculated using the CKD EPI equation. This calculation has not been validated in all clinical situations. eGFR's persistently <60 mL/min signify possible Chronic Kidney Disease.    Anion gap 12 5 - 15    Comment: Performed at [place] [address], [place], [place] [address]  CBC with Differential/Platelet     Status: Abnormal   Collection Time: 06/30/17  3:50 AM  Result Value Ref Range   WBC 18.7 (H) 4.0 - 10.5 K/uL    Comment: REPEATED TO VERIFY   RBC 4.94 4.22 - 5.81 MIL/uL   Hemoglobin 12.9 (L) 13.0 - 17.0 g/dL   HCT 37.9 (L) 39.0 - 52.0 %   MCV 76.7 (L) 78.0 - 100.0 fL   MCH 26.1 26.0 - 34.0 pg   MCHC 34.0 30.0 - 36.0 g/dL   RDW 14.6 11.5 - 15.5 %   Platelets 127 (L) 150 - 400 K/uL   Neutrophils Relative % 76 %   Lymphocytes Relative 21 %   Monocytes Relative 3 %  Eosinophils Relative 0 %   Basophils Relative 0 %   Band Neutrophils 0 %   Metamyelocytes Relative 0 %   Myelocytes 0 %   Promyelocytes Relative 0 %   Blasts 0 %   nRBC 0 0 /100 WBC   Neutro Abs  14.2 (H) 1.7 - 7.7 K/uL   Lymphs Abs 3.9 0.7 - 4.0 K/uL   Monocytes Absolute 0.6 0.1 - 1.0 K/uL   Eosinophils Absolute 0.0 0.0 - 0.7 K/uL   Basophils Absolute 0.0 0.0 - 0.1 K/uL   RBC Morphology POLYCHROMASIA PRESENT    WBC Morphology INCREASED BANDS (>20% BANDS)     Comment: Performed at [facility] [address], [place], [place] [address]  Magnesium     Status: None   Collection Time: 06/30/17  3:50 AM  Result Value Ref Range   Magnesium 1.9 1.7 - 2.4 mg/dL    Comment: Performed at [place] [address], [place], [place] [address]  Glucose, capillary     Status: None   Collection Time: 06/30/17  7:31 AM  Result Value Ref Range   Glucose-Capillary 99 65 - 99 mg/dL   Comment 1 Notify RN    Comment 2 Document in Chart       Component Value Date/Time   SDES  06/28/2017 2110    BLOOD RIGHT ANTECUBITAL Performed at [facility], [place] [address], [place], [place] [address]    SDES  06/28/2017 2110    BLOOD LEFT HAND Performed at [facility], [place] [address] [place], [place] [address]    SPECREQUEST  06/28/2017 2110    BOTTLES DRAWN AEROBIC AND ANAEROBIC Blood Culture adequate volume Performed at [place] [address], [place], [place] [address]    SPECREQUEST  06/28/2017 2110    BOTTLES DRAWN AEROBIC AND ANAEROBIC Blood Culture adequate volume Performed at [place] [address] [place], [place] [address]    CULT GRAM POSITIVE COCCI IN CHAINS 06/28/2017 2110   CULT (A) 06/28/2017 2110    STREPTOCOCCUS PNEUMONIAE SUSCEPTIBILITIES TO FOLLOW Performed at [place] [address], [place], [place] [address]    REPTSTATUS PENDING 06/28/2017 2110   REPTSTATUS PENDING 06/28/2017 2110   Dg Chest 2 View  Result Date: 06/28/2017 CLINICAL DATA:  Chest pain. EXAM: CHEST - 2 VIEW COMPARISON:  No prior. FINDINGS: Thoracic aorta is tortuous. Heart size normal. Diffuse  bilateral pulmonary interstitial prominence noted. Although an active interstitial process may be present changes may be secondary to chronic interstitial lung disease. A component of bronchiectasis most likely present in the lung bases. No focal alveolar infiltrate. Biapical and left-sided pleural thickening noted most likely secondary to scarring. Small left pleural effusion may be present. IMPRESSION: 1. Interstitial lung disease possibly chronic. A component of basilar bronchiectasis most likely present in the lung bases. Pleural scarring. 2. Tortuous thoracic aorta. Heart size normal. No pulmonary venous congestion. Electronically Signed   By: Marcello Moores  Register   On: 06/28/2017 13:55   Mr Brain Wo Contrast  Result Date: 06/28/2017 CLINICAL DATA:  Vertigo, suspect stroke.  Fever and cough. EXAM: MRI HEAD WITHOUT CONTRAST TECHNIQUE: Multiplanar, multiecho pulse sequences of the brain and surrounding structures were obtained without intravenous contrast. COMPARISON:  MRI of the head June 25, 2006 FINDINGS: Mild motion degraded examination. INTRACRANIAL CONTENTS: 3 LEFT cerebellar foci of reduced diffusion measuring to 14 mm with low ADC values. No susceptibility artifact to  suggest hemorrhage. Old bilateral basal ganglia infarcts new from prior MRI. Old small bilateral basal ganglia and thalami infarcts. Mild ex vacuo dilatation subjacent lateral ventricle. No parenchymal brain volume loss for age. No hydrocephalus. No suspicious parenchymal signal, masses, mass effect. No abnormal extra-axial fluid collections. No extra-axial masses. VASCULAR: Normal major intracranial vascular flow voids present at skull base. SKULL AND UPPER CERVICAL SPINE: No abnormal sellar expansion. No suspicious calvarial bone marrow signal. Craniocervical junction maintained. SINUSES/ORBITS: Mild paranasal sinus mucosal thickening without air-fluid levels. Mastoid air cells are well aerated.The included ocular globes and orbital  contents are non-suspicious. OTHER: None. IMPRESSION: 1. Three acute nonhemorrhagic small LEFT cerebellar infarcts. 2. Old small bilateral cerebellar infarcts. Old basal ganglia and thalamus infarcts. Electronically Signed   By: Elon Alas M.D.   On: 06/28/2017 21:16   Recent Results (from the past 240 hour(s))  Culture, blood (Routine X 2) w Reflex to ID Panel     Status: None (Preliminary result)   Collection Time: 06/28/17  9:10 PM  Result Value Ref Range Status   Specimen Description   Final    BLOOD RIGHT ANTECUBITAL Performed at [facility] [address], [place], [place] [address]    Special Requests   Final    BOTTLES DRAWN AEROBIC AND ANAEROBIC Blood Culture adequate volume Performed at [facility] [address], [place], [place] [address]    Culture  Setup Time   Final    GRAM POSITIVE COCCI IN CHAINS IN BOTH AEROBIC AND ANAEROBIC BOTTLES CRITICAL VALUE NOTED.  VALUE IS CONSISTENT WITH PREVIOUSLY REPORTED AND CALLED VALUE. Performed at [facility], [place] [address], [place], [place] [address]    Culture GRAM POSITIVE COCCI IN CHAINS  Final   Report Status PENDING  Incomplete  Culture, blood (Routine X 2) w Reflex to ID Panel     Status: Abnormal (Preliminary result)   Collection Time: 06/28/17  9:10 PM  Result Value Ref Range Status   Specimen Description   Final    BLOOD LEFT HAND Performed at [facility] [address] [place], [place] [address]    Special Requests   Final    BOTTLES DRAWN AEROBIC AND ANAEROBIC Blood Culture adequate volume Performed at [facility] [address] [place], [place] [address]    Culture  Setup Time   Final    GRAM POSITIVE COCCI IN CHAINS IN BOTH AEROBIC AND ANAEROBIC BOTTLES CRITICAL RESULT CALLED TO, READ BACK BY AND VERIFIED WITH: D. Wofford PharmD 14:45 06/29/17 ([email or web address])    Culture (A)  Final    STREPTOCOCCUS  PNEUMONIAE SUSCEPTIBILITIES TO FOLLOW Performed at [facility], [place] [address], [place], [place] [address]    Report Status PENDING  Incomplete  Blood Culture ID Panel (Reflexed)     Status: Abnormal   Collection Time: 06/28/17  9:10 PM  Result Value Ref Range Status   Enterococcus species NOT DETECTED NOT DETECTED Final   Listeria monocytogenes NOT DETECTED NOT DETECTED Final   Staphylococcus species NOT DETECTED NOT DETECTED Final   Staphylococcus aureus NOT DETECTED NOT DETECTED Final   Streptococcus species DETECTED (A) NOT DETECTED Final    Comment: CRITICAL RESULT CALLED TO, READ BACK BY AND VERIFIED WITH: D. Wofford PharmD 14:45 06/29/17 ([email or web address])    Streptococcus agalactiae NOT DETECTED NOT DETECTED Final   Streptococcus pneumoniae DETECTED (A) NOT DETECTED Final    Comment: CRITICAL RESULT CALLED TO, READ BACK BY AND  VERIFIED WITH: D. Wofford PharmD 14:45 06/29/17 ([email or web address])    Streptococcus pyogenes NOT DETECTED NOT DETECTED Final   Acinetobacter baumannii NOT DETECTED NOT DETECTED Final   Enterobacteriaceae species NOT DETECTED NOT DETECTED Final   Enterobacter cloacae complex NOT DETECTED NOT DETECTED Final   Escherichia coli NOT DETECTED NOT DETECTED Final   Klebsiella oxytoca NOT DETECTED NOT DETECTED Final   Klebsiella pneumoniae NOT DETECTED NOT DETECTED Final   Proteus species NOT DETECTED NOT DETECTED Final   Serratia marcescens NOT DETECTED NOT DETECTED Final   Haemophilus influenzae NOT DETECTED NOT DETECTED Final   Neisseria meningitidis NOT DETECTED NOT DETECTED Final   Pseudomonas aeruginosa NOT DETECTED NOT DETECTED Final   Candida albicans NOT DETECTED NOT DETECTED Final   Candida glabrata NOT DETECTED NOT DETECTED Final   Candida krusei NOT DETECTED NOT DETECTED Final   Candida parapsilosis NOT DETECTED NOT DETECTED Final   Candida tropicalis NOT DETECTED NOT DETECTED Final    Comment: Performed at [facility], [place] [address], [place],  [place] [address]    Microbiology: Recent Results (from the past 240 hour(s))  Culture, blood (Routine X 2) w Reflex to ID Panel     Status: None (Preliminary result)   Collection Time: 06/28/17  9:10 PM  Result Value Ref Range Status   Specimen Description   Final    BLOOD RIGHT ANTECUBITAL Performed at [facility] [address] [place], [place] [address]    Special Requests   Final    BOTTLES DRAWN AEROBIC AND ANAEROBIC Blood Culture adequate volume Performed at [facility] [address], [place], [place] [address]    Culture  Setup Time   Final    GRAM POSITIVE COCCI IN CHAINS IN BOTH AEROBIC AND ANAEROBIC BOTTLES CRITICAL VALUE NOTED.  VALUE IS CONSISTENT WITH PREVIOUSLY REPORTED AND CALLED VALUE. Performed at [facility], [place] [address], [place], [place] [address]    Culture GRAM POSITIVE COCCI IN CHAINS  Final   Report Status PENDING  Incomplete  Culture, blood (Routine X 2) w Reflex to ID Panel     Status: Abnormal (Preliminary result)   Collection Time: 06/28/17  9:10 PM  Result Value Ref Range Status   Specimen Description   Final    BLOOD LEFT HAND Performed at [facility] [address] [place] [address], [place], [place] [address]    Special Requests   Final    BOTTLES DRAWN AEROBIC AND ANAEROBIC Blood Culture adequate volume Performed at [facility] [address], [place], [place] [address]    Culture  Setup Time   Final    GRAM POSITIVE COCCI IN CHAINS IN BOTH AEROBIC AND ANAEROBIC BOTTLES CRITICAL RESULT CALLED TO, READ BACK BY AND VERIFIED WITH: D. Wofford PharmD 14:45 06/29/17 ([email or web address])    Culture (A)  Final    STREPTOCOCCUS PNEUMONIAE SUSCEPTIBILITIES TO FOLLOW Performed at [facility], [place] [address], [place], [place] [address]    Report Status PENDING  Incomplete  Blood Culture ID Panel (Reflexed)     Status: Abnormal   Collection Time: 06/28/17  9:10 PM  Result Value Ref Range  Status   Enterococcus species NOT DETECTED NOT DETECTED Final   Listeria monocytogenes NOT DETECTED NOT DETECTED Final   Staphylococcus species NOT DETECTED NOT DETECTED Final   Staphylococcus aureus NOT DETECTED NOT DETECTED Final   Streptococcus species DETECTED (A) NOT DETECTED Final    Comment: CRITICAL RESULT CALLED TO,  READ BACK BY AND VERIFIED WITH: D. Wofford PharmD 14:45 06/29/17 ([email or web address])    Streptococcus agalactiae NOT DETECTED NOT DETECTED Final   Streptococcus pneumoniae DETECTED (A) NOT DETECTED Final    Comment: CRITICAL RESULT CALLED TO, READ BACK BY AND VERIFIED WITH: D. Wofford PharmD 14:45 06/29/17 ([email or web address])    Streptococcus pyogenes NOT DETECTED NOT DETECTED Final   Acinetobacter baumannii NOT DETECTED NOT DETECTED Final   Enterobacteriaceae species NOT DETECTED NOT DETECTED Final   Enterobacter cloacae complex NOT DETECTED NOT DETECTED Final   Escherichia coli NOT DETECTED NOT DETECTED Final   Klebsiella oxytoca NOT DETECTED NOT DETECTED Final   Klebsiella pneumoniae NOT DETECTED NOT DETECTED Final   Proteus species NOT DETECTED NOT DETECTED Final   Serratia marcescens NOT DETECTED NOT DETECTED Final   Haemophilus influenzae NOT DETECTED NOT DETECTED Final   Neisseria meningitidis NOT DETECTED NOT DETECTED Final   Pseudomonas aeruginosa NOT DETECTED NOT DETECTED Final   Candida albicans NOT DETECTED NOT DETECTED Final   Candida glabrata NOT DETECTED NOT DETECTED Final   Candida krusei NOT DETECTED NOT DETECTED Final   Candida parapsilosis NOT DETECTED NOT DETECTED Final   Candida tropicalis NOT DETECTED NOT DETECTED Final    Comment: Performed at [facility], [place] [address], [place], [place] [address]    Radiographs and labs were personally reviewed by me.   Bobby Rumpf, MD [facility] [phone] 06/30/2017, 11:25 AM

## 2017-06-30 NOTE — Progress Notes (Signed)
Progress Note  Patient Name: Thomas [name] Date of Encounter: 06/30/2017  Primary Cardiologist: Sanda Klein, MD   Subjective   62 year old male admitted with chest pain and elevated troponin levels.  He is thought to have an acute pericarditis.  Troponins are flat. He had evidence of embolic strokes and a febrile illness.  Echocardiogram performed on April 5 revealed normal left ventricular systolic function.  We were unable to assess diastolic function.  There was no evidence of pericardial effusion.  White blood cell count was 21.8 on admission.  It increased up to 24,000 and now is back down to 18.7.  Pro-calcitonin level is 39.9.    Inpatient Medications    Scheduled Meds: . aspirin EC  81 mg Oral Daily  . atorvastatin  40 mg Oral q1800  . heparin  5,000 Units Subcutaneous Q8H  . senna  1 tablet Oral BID   Continuous Infusions: . sodium chloride Stopped (06/30/17 1000)  . cefTRIAXone (ROCEPHIN)  IV Stopped (06/29/17 1809)   PRN Meds: acetaminophen **OR** acetaminophen, acetaminophen, gadobenate dimeglumine, ketorolac, magnesium citrate, meclizine, nitroGLYCERIN, ondansetron (ZOFRAN) IV, oxyCODONE, polyethylene glycol, sorbitol, zolpidem   Vital Signs    Vitals:   06/29/17 1952 06/29/17 2031 06/29/17 2358 06/30/17 0804  BP: 130/79 131/73 125/82 115/79  Pulse: 99 98 91 75  Resp: 20 18  18   Temp: (!) 100.5 F (38.1 C) 99.7 F (37.6 C) (!) 100.4 F (38 C) 98.2 F (36.8 C)  TempSrc: Oral Oral Oral Oral  SpO2: 95% 93% 98% 100%  Weight:      Height:        Intake/Output Summary (Last 24 hours) at 06/30/2017 1213 Last data filed at 06/30/2017 1000 Gross per 24 hour  Intake 2900 ml  Output 600 ml  Net 2300 ml   Filed Weights   06/28/17 2051 06/29/17 0500  Weight: 141 lb (64 kg) 141 lb 1.5 oz (64 kg)    Telemetry     NSR - Personally Reviewed  ECG     NSR  - Personally Reviewed  Physical Exam  Pt is in MRI  Unable to examine  Labs     Chemistry Recent Labs  Lab 06/28/17 1544 06/29/17 0320 06/30/17 0350  NA 137 136 132*  K 3.8 3.9 3.7  CL 106 106 105  CO2 23 23 15*  GLUCOSE 107* 105* 102*  BUN 25* 23* 19  CREATININE 1.74* 1.64* 1.30*  CALCIUM 8.4* 8.2* 7.9*  GFRNONAA 40* 43* 57*  GFRAA 46* 50* >60  ANIONGAP 8 7 12      Hematology Recent Labs  Lab 06/28/17 1544 06/29/17 0320 06/30/17 0350  WBC 24.1* 17.3* 18.7*  RBC 5.15 5.25 4.94  HGB 13.6 13.7 12.9*  HCT 40.2 41.0 37.9*  MCV 78.1 78.1 76.7*  MCH 26.4 26.1 26.1  MCHC 33.8 33.4 34.0  RDW 14.2 14.4 14.6  PLT 226 175 127*    Cardiac Enzymes Recent Labs  Lab 06/28/17 1544 06/28/17 2120 06/29/17 0320 06/29/17 0904  TROPONINI 0.34* 0.45* 0.41* 0.34*    Recent Labs  Lab 06/28/17 1552  TROPIPOC 0.33*     BNPNo results for input(s): BNP, PROBNP in the last 168 hours.   DDimer No results for input(s): DDIMER in the last 168 hours.   Radiology    Dg Chest 2 View  Result Date: 06/28/2017 CLINICAL DATA:  Chest pain. EXAM: CHEST - 2 VIEW COMPARISON:  No prior. FINDINGS: Thoracic aorta is tortuous. Heart size normal. Diffuse bilateral pulmonary interstitial  prominence noted. Although an active interstitial process may be present changes may be secondary to chronic interstitial lung disease. A component of bronchiectasis most likely present in the lung bases. No focal alveolar infiltrate. Biapical and left-sided pleural thickening noted most likely secondary to scarring. Small left pleural effusion may be present. IMPRESSION: 1. Interstitial lung disease possibly chronic. A component of basilar bronchiectasis most likely present in the lung bases. Pleural scarring. 2. Tortuous thoracic aorta. Heart size normal. No pulmonary venous congestion. Electronically Signed   By: Marcello Moores  Register   On: 06/28/2017 13:55   Mr Brain Wo Contrast  Result Date: 06/28/2017 CLINICAL DATA:  Vertigo, suspect stroke.  Fever and cough. EXAM: MRI HEAD WITHOUT CONTRAST  TECHNIQUE: Multiplanar, multiecho pulse sequences of the brain and surrounding structures were obtained without intravenous contrast. COMPARISON:  MRI of the head June 25, 2006 FINDINGS: Mild motion degraded examination. INTRACRANIAL CONTENTS: 3 LEFT cerebellar foci of reduced diffusion measuring to 14 mm with low ADC values. No susceptibility artifact to suggest hemorrhage. Old bilateral basal ganglia infarcts new from prior MRI. Old small bilateral basal ganglia and thalami infarcts. Mild ex vacuo dilatation subjacent lateral ventricle. No parenchymal brain volume loss for age. No hydrocephalus. No suspicious parenchymal signal, masses, mass effect. No abnormal extra-axial fluid collections. No extra-axial masses. VASCULAR: Normal major intracranial vascular flow voids present at skull base. SKULL AND UPPER CERVICAL SPINE: No abnormal sellar expansion. No suspicious calvarial bone marrow signal. Craniocervical junction maintained. SINUSES/ORBITS: Mild paranasal sinus mucosal thickening without air-fluid levels. Mastoid air cells are well aerated.The included ocular globes and orbital contents are non-suspicious. OTHER: None. IMPRESSION: 1. Three acute nonhemorrhagic small LEFT cerebellar infarcts. 2. Old small bilateral cerebellar infarcts. Old basal ganglia and thalamus infarcts. Electronically Signed   By: Elon Alas M.D.   On: 06/28/2017 21:16    Cardiac Studies     Patient Profile     62 y.o. male admitted with chest pain, acute febrile illness, and evidence of embolic emboli.  Assessment & Plan    1.  Elevated troponin levels.  The troponin levels are flat.  This is more consistent with sepsis syndrome and not an acute coronary syndrome. Echocardiogram reveals normal left ventricular systolic function.  We were unable to evaluate the diastolic function. No evidence of pericardial effusion.  No vegetations seen on the valves on transthoracic echo.  2.  Sepsis syndrome: The patient was  found to have strep pneumoniae and blood cultures.  Further plans per internal medicine and ?  ID     For questions or updates, please contact [facility] Please consult [email or web address] for contact info under Cardiology/STEMI.      Signed, Mertie Moores, MD  06/30/2017, 12:13 PM

## 2017-06-30 NOTE — Evaluation (Signed)
Speech Language Pathology Evaluation Patient Details Name: Thomas [name] MRN: [id number] DOB: Aug 09, 1955 Today's Date: 06/30/2017 Time: 5366-4403 SLP Time Calculation (min) (ACUTE ONLY): 23 min  Problem List:  Patient Active Problem List   Diagnosis Date Noted  . Bacteremia due to Streptococcus 06/30/2017  . CVA (cerebral vascular accident) ([place]) 06/30/2017  . Chest pain 06/28/2017  . CAP (community acquired pneumonia): Clinical 06/28/2017  . Acute renal failure superimposed on stage 3 chronic kidney disease ([place]) 06/28/2017  . Dehydration 06/28/2017  . Leukocytosis 06/28/2017  . Elevated troponin 06/28/2017  . Dizziness 06/28/2017  . Vertigo 06/28/2017  . Acute pericarditis: Probable 06/28/2017   Past Medical History:  Past Medical History:  Diagnosis Date  . Hypertension    Past Surgical History: History reviewed. No pertinent surgical history. HPI:  62 y.o. male with medical history significant of ongoing tobacco abuse otherwise unremarkable presented to the ED with sudden onset of subjective fevers, chills, diaphoresis, nonproductive cough that started at 4 AM on the day of admission with 3 episodes of emesis and some nausea.  Patient also with complaints of sore throat.  Patient stated that the night prior to admission around 10 PM had an episode of left upper chest pain describes as a sharp pain that lasted approximately 2 hours and subsequently resolved with no further recurrence. MRI head on 06/30/17 indicated Normal enhancement postcontrast infusion. Small acute infarcts left cerebellum do not enhance  Assessment / Plan / Recommendation Clinical Impression   Pt administered the [facility] Cognitive Assessment) with an overall score of 20/30 obtained with a typical score within the normal range being 26/30; pt speaks [place] as a second language with primary language of [place] origin.  This may have impacted his score somewhat during SLE on tasks such as word fluency and  delayed recall for words; declined interpreter when offered for evaluation; pt able to attend to simple tasks, but had difficulty with math calculation and recall of numbers in backward order; immediate recall for memory [facility], but delayed recall affected; pt did not have reading glasses with him, so reading was not fully assessed this date;  Pt oriented x4; word fluency task decreased primarily d/t ESL status; able to repeat sentences and note simple similarities between concepts; pt appeared to have awareness of discrepancy with continuing with FT employment vs part-time since experiencing this new onset CVA; on-going cognitive assessment with functional tasks and insight from family for previous baseline cognitive function; ST will f/u while in acute setting for cognitive assessment/treatment.    SLP Assessment  SLP Recommendation/Assessment: Patient needs continued Speech Language Pathology Services SLP Visit Diagnosis: Cognitive communication deficit (R41.841)    Follow Up Recommendations  Other (comment)(TBD)    Frequency and Duration min 2x/week  1 week      SLP Evaluation Cognition  Overall Cognitive Status: Difficult to assess Arousal/Alertness: Awake/alert Orientation Level: Oriented X4 Memory: (Difficult to assess) Awareness: Appears intact Problem Solving: Appears intact Behaviors: Restless;Other (comment)(related to pain? stated it was an 8) Safety/Judgment: Appears intact       Comprehension  Auditory Comprehension Overall Auditory Comprehension: Appears within functional limits for tasks assessed Commands: Within Functional Limits Conversation: Simple Other Conversation Comments: (ESL; primary language [place] origin) Interfering Components: Pain EffectiveTechniques: Repetition Reading Comprehension Reading Status: Unable to assess (comment)(pt did not have glasses present)    Expression Expression Primary Mode of Expression: Verbal Verbal Expression Overall  Verbal Expression: Appears within functional limits for tasks assessed Initiation: No impairment Level of Generative/Spontaneous Verbalization:  Conversation Repetition: No impairment Naming: No impairment Non-Verbal Means of Communication: Not applicable Other Verbal Expression Comments: (ESL impacts results) Written Expression Dominant Hand: Right Written Expression: Unable to assess (comment)(pt refused d/t not having glasses)   Oral / Motor  Oral Motor/Sensory Function Overall Oral Motor/Sensory Function: Within functional limits Motor Speech Overall Motor Speech: Appears within functional limits for tasks assessed Respiration: Within functional limits Phonation: Normal Resonance: Within functional limits Articulation: Within functional limitis Intelligibility: Intelligible Motor Planning: Witnin functional limits Motor Speech Errors: Not applicable                       Elvina Sidle, M.S., CCC-SLP 06/30/2017, 1:51 PM

## 2017-06-30 NOTE — Progress Notes (Signed)
PROGRESS NOTE    Thomas [name]  BMW:[id number] DOB: 03-15-1956 DOA: 06/28/2017 PCP: Patient, No Pcp Per    Brief Narrative:  Thomas [name] is a 62 year old male with medical history significant for tobacco abuse otherwise unremarkable.  Patient presented to the emergency department with sudden onset of chest pain, fevers, chills and nonproductive cough.  All other associated symptoms include vertigo and generalized weakness.  Patient was seen at [facility] where EKG was done and showed sinus rhythm with right bundle branch block and ST segment elevation in anterior leads, therefore patient was transferred to [facility] for further workup.  Cardiology evaluated patient and suspected pericarditis.  In the ED was found to have chest x-ray with vascular bronchiectasis, influenza A/B was negative, leukocytosis and elevated creatinine of 1.74.  Cardiac enzymes with elevated troponin.  And UA was negative.  Patient was admitted with working diagnosis of pneumonia and acute pericarditis.  Given vertigo MRI was performed which showed 3 acute nonhemorrhagic left cerebellar old infarct.  Neurology was consulted and recommended transfer to [facility] for embolic stroke workup. Cultures obtained consistent with bacteremia secondary to Streptococcus.  ID consulted.  Patient likely needs a TEE.  Patient with probable bacterial endocarditis.     Assessment & Plan:   Principal Problem:   Acute pericarditis: Probable Active Problems:   Bacteremia due to Streptococcus   CAP (community acquired pneumonia): Clinical   CVA (cerebral vascular accident) ([place])   Chest pain   Acute renal failure superimposed on stage 3 chronic kidney disease (HCC)   Dehydration   Leukocytosis   Elevated troponin   Dizziness   Vertigo  #1 chest pain secondary to suspected pericarditis Patient was seen on admission in consultation with cardiology and felt likely had a pericarditis.  2D echo obtained with a normal EF with  no wall motion abnormalities.  Patient started on aspirin 81 mg daily.  No other NSAIDs at this time.  Troponin trend was flat.  Cardiology following and appreciate input and recommendations.  2.  Clinical community-acquired pneumonia Chest x-ray done on admission did have interstitial lung disease and concern for infiltrate in the left lower lobe.  Patient had presented with fevers chills and a cough.  Urine Streptococcus antigen was positive.  Leukocytosis fluctuating and initially went up as high as 24.1 and trending back down and currently at 18.7.  Blood cultures positive for Streptococcus and concerned that patient may have endocarditis.  IV azithromycin has been discontinued.  IV Rocephin dose was increased to 2 g daily.  Consult with ID for further evaluation and management.  3.  Bacteremia secondary to Streptococcus Patient presented with chest pain, febrile illness, concerns for community-acquired pneumonia with a left lower lobe infiltrate.  Patient also with concerns for pericarditis.  MRI of the head done consistent with 3 nonhemorrhagic acute infarcts.  Pro-calcitonin elevated at 39.90.  Concern for endocarditis.  IV Rocephin dose has been increased to 2 g daily.  2D echo negative for any vegetations.  Patient likely needs a TEE.  Cardiology following and hopefully TEE can be arranged on Monday, 07/02/2017.  Will consult with ID for further evaluation and management.  4.  Acute CVA Patient on presentation had complaints of dizziness that has been ongoing.  MRI of the brain which was done showed 3 acute nonhemorrhagic small left cerebellar infarcts.  Old small bilateral cerebellar infarcts.  Old basal ganglia and thalamus infarcts.  Patient noted to have a febrile illness T-max of 100.5 over  the past 24 hours.  Blood cultures positive for Streptococcus.  Urine strep pneumococcus antigen was positive and patient also being treated for clinically acquired pneumonia.  Concern for septic emboli.  MRA  of the head and neck pending.  2D echo done with EF of 60-65%, no wall motion abnormalities.  Patient likely needs a TEE.  MRA neck with no significant extracranial carotid stenosis.  Hemoglobin A1c 5.6.  LDL of 72.  Patient started on Lipitor 40 mg daily.  Will need a statin on discharge.  Neurology following and appreciate input and recommendations.  5.  Acute on chronic kidney disease stage III Secondary to prerenal azotemia.  Renal function improving with hydration.  Follow.   DVT prophylaxis: Heparin. Code Status: Full Family Communication: Updated patient.  No family at bedside. Disposition Plan: Home Once workup is complete with clinical improvement.   Consultants:   Cardiology: Dr.Croitoru 06/28/2017  Neurology: Dr.Arora 06/29/2017  Infectious disease: Dr. Johnnye Sima pending 06/30/2017  Procedures:   MRI head 06/28/2017  MRA head and neck pending 06/30/2017  2D echo 06/29/2017  Chest x-ray 06/28/2017    Antimicrobials:   IV Rocephin 06/28/2017  IV azithromycin for 06/2017>>>> 06/29/2017   Subjective: Patient getting ready to go for MRA.  Vision complain of some chills.  Denies any shortness of breath or chest pain.  States does not feel too well.  Patient with temperatures of 100.5.  Objective: Vitals:   06/29/17 2031 06/29/17 2358 06/30/17 0804 06/30/17 1737  BP: 131/73 125/82 115/79 140/67  Pulse: 98 91 75 76  Resp: 18  18 18   Temp: 99.7 F (37.6 C) (!) 100.4 F (38 C) 98.2 F (36.8 C) 99.5 F (37.5 C)  TempSrc: Oral Oral Oral Oral  SpO2: 93% 98% 100% 100%  Weight:      Height:        Intake/Output Summary (Last 24 hours) at 06/30/2017 1742 Last data filed at 06/30/2017 1600 Gross per 24 hour  Intake 1753.33 ml  Output 300 ml  Net 1453.33 ml   Filed Weights   06/28/17 2051 06/29/17 0500  Weight: 64 kg (141 lb) 64 kg (141 lb 1.5 oz)    Examination:  General exam: Appears calm and comfortable  Respiratory system: Some decreased breath sounds in the bases  otherwise clear.  Denies any chest pain.  Respiratory effort normal. Cardiovascular system: S1 & S2 heard, RRR. No JVD, murmurs, rubs, gallops or clicks. No pedal edema. Gastrointestinal system: Abdomen is nondistended, soft and nontender. No organomegaly or masses felt. Normal bowel sounds heard. Central nervous system: Alert and oriented. No focal neurological deficits. Extremities: Symmetric 5 x 5 power. Skin: No rashes, lesions or ulcers Psychiatry: Judgement and insight appear normal. Mood & affect appropriate.     Data Reviewed: I have personally reviewed following labs and imaging studies  CBC: Recent Labs  Lab 06/28/17 1335 06/28/17 1544 06/29/17 0320 06/30/17 0350  WBC 21.8* 24.1* 17.3* 18.7*  NEUTROABS  --  22.9*  --  14.2*  HGB 14.5 13.6 13.7 12.9*  HCT 44.4 40.2 41.0 37.9*  MCV 77.8* 78.1 78.1 76.7*  PLT  --  226 175 413*   Basic Metabolic Panel: Recent Labs  Lab 06/28/17 1544 06/28/17 2110 06/29/17 0320 06/30/17 0350  NA 137  --  136 132*  K 3.8  --  3.9 3.7  CL 106  --  106 105  CO2 23  --  23 15*  GLUCOSE 107*  --  105* 102*  BUN 25*  --  23* 19  CREATININE 1.74*  --  1.64* 1.30*  CALCIUM 8.4*  --  8.2* 7.9*  MG 1.4* 1.5* 2.5* 1.9   GFR: Estimated Creatinine Clearance: 50.6 mL/min (A) (by C-G formula based on SCr of 1.3 mg/dL (H)). Liver Function Tests: No results for input(s): AST, ALT, ALKPHOS, BILITOT, PROT, ALBUMIN in the last 168 hours. No results for input(s): LIPASE, AMYLASE in the last 168 hours. No results for input(s): AMMONIA in the last 168 hours. Coagulation Profile: Recent Labs  Lab 06/29/17 0320  INR 1.23   Cardiac Enzymes: Recent Labs  Lab 06/28/17 1544 06/28/17 2120 06/29/17 0320 06/29/17 0904  TROPONINI 0.34* 0.45* 0.41* 0.34*   BNP (last 3 results) No results for input(s): PROBNP in the last 8760 hours. HbA1C: Recent Labs    06/29/17 0320  HGBA1C 5.6   CBG: Recent Labs  Lab 06/28/17 1625 06/29/17 0813  06/30/17 0731  GLUCAP 115* 90 99   Lipid Profile: Recent Labs    06/29/17 0320  CHOL 128  HDL 37*  LDLCALC 72  TRIG 97  CHOLHDL 3.5   Thyroid Function Tests: Recent Labs    06/28/17 2110  TSH 0.471   Anemia Panel: No results for input(s): VITAMINB12, FOLATE, FERRITIN, TIBC, IRON, RETICCTPCT in the last 72 hours. Sepsis Labs: Recent Labs  Lab 06/29/17 2128  PROCALCITON 39.90    Recent Results (from the past 240 hour(s))  Culture, blood (Routine X 2) w Reflex to ID Panel     Status: Abnormal (Preliminary result)   Collection Time: 06/28/17  9:10 PM  Result Value Ref Range Status   Specimen Description   Final    BLOOD RIGHT ANTECUBITAL Performed at [facility] [address], [place], [place] [address]    Special Requests   Final    BOTTLES DRAWN AEROBIC AND ANAEROBIC Blood Culture adequate volume Performed at [facility] [address], [place], [place] [address]    Culture  Setup Time   Final    GRAM POSITIVE COCCI IN CHAINS IN BOTH AEROBIC AND ANAEROBIC BOTTLES CRITICAL VALUE NOTED.  VALUE IS CONSISTENT WITH PREVIOUSLY REPORTED AND CALLED VALUE. Performed at [facility], [place] [address], [place], [place] [address]    Culture STREPTOCOCCUS PNEUMONIAE (A)  Final   Report Status PENDING  Incomplete  Culture, blood (Routine X 2) w Reflex to ID Panel     Status: Abnormal (Preliminary result)   Collection Time: 06/28/17  9:10 PM  Result Value Ref Range Status   Specimen Description   Final    BLOOD LEFT HAND Performed at [facility] [address] [place], [place] [address]    Special Requests   Final    BOTTLES DRAWN AEROBIC AND ANAEROBIC Blood Culture adequate volume Performed at [facility] [address] [place], [place] [address]    Culture  Setup Time   Final    GRAM POSITIVE COCCI IN CHAINS IN BOTH AEROBIC AND ANAEROBIC BOTTLES CRITICAL RESULT CALLED TO, READ  BACK BY AND VERIFIED WITH: D. Wofford PharmD 14:45 06/29/17 ([email or web address])    Culture (A)  Final    STREPTOCOCCUS PNEUMONIAE SUSCEPTIBILITIES TO FOLLOW Performed at [facility], [place] [address] [place], [place] [address]    Report Status PENDING  Incomplete  Blood Culture ID Panel (Reflexed)     Status: Abnormal   Collection Time: 06/28/17  9:10 PM  Result Value Ref Range Status   Enterococcus species NOT  DETECTED NOT DETECTED Final   Listeria monocytogenes NOT DETECTED NOT DETECTED Final   Staphylococcus species NOT DETECTED NOT DETECTED Final   Staphylococcus aureus NOT DETECTED NOT DETECTED Final   Streptococcus species DETECTED (A) NOT DETECTED Final    Comment: CRITICAL RESULT CALLED TO, READ BACK BY AND VERIFIED WITH: D. Wofford PharmD 14:45 06/29/17 ([email or web address])    Streptococcus agalactiae NOT DETECTED NOT DETECTED Final   Streptococcus pneumoniae DETECTED (A) NOT DETECTED Final    Comment: CRITICAL RESULT CALLED TO, READ BACK BY AND VERIFIED WITH: D. Wofford PharmD 14:45 06/29/17 ([email or web address])    Streptococcus pyogenes NOT DETECTED NOT DETECTED Final   Acinetobacter baumannii NOT DETECTED NOT DETECTED Final   Enterobacteriaceae species NOT DETECTED NOT DETECTED Final   Enterobacter cloacae complex NOT DETECTED NOT DETECTED Final   Escherichia coli NOT DETECTED NOT DETECTED Final   Klebsiella oxytoca NOT DETECTED NOT DETECTED Final   Klebsiella pneumoniae NOT DETECTED NOT DETECTED Final   Proteus species NOT DETECTED NOT DETECTED Final   Serratia marcescens NOT DETECTED NOT DETECTED Final   Haemophilus influenzae NOT DETECTED NOT DETECTED Final   Neisseria meningitidis NOT DETECTED NOT DETECTED Final   Pseudomonas aeruginosa NOT DETECTED NOT DETECTED Final   Candida albicans NOT DETECTED NOT DETECTED Final   Candida glabrata NOT DETECTED NOT DETECTED Final   Candida krusei NOT DETECTED NOT DETECTED Final   Candida parapsilosis NOT DETECTED NOT DETECTED Final   Candida tropicalis  NOT DETECTED NOT DETECTED Final    Comment: Performed at [facility], [place] [address] [place], [place] [address]         Radiology Studies: Mr Virgel Paling DJ Contrast  Result Date: 06/30/2017 CLINICAL DATA:  Stroke EXAM: MRA HEAD WITHOUT CONTRAST TECHNIQUE: Angiographic images of the Circle of Willis were obtained using MRA technique without intravenous contrast. COMPARISON:  MRI head 06/28/2017 FINDINGS: Severe stenosis distal right vertebral artery, better seen on the MRA neck from today. Left vertebral artery patent to the basilar. Moderate stenosis origin of right AICA which contributes to the right PICA. Bilateral PICA patent. Basilar patent. Superior cerebellar and posterior cerebral arteries patent bilaterally. Mild to moderate stenosis in the posterior cerebral artery bilaterally. Internal carotid artery widely patent bilaterally. Occlusion of the proximal right anterior cerebral artery Left middle cerebral artery patent with mild irregularity distally. Anterior cerebral arteries patent bilaterally. Negative for cerebral aneurysm. IMPRESSION: Occlusion of the right M1 segment. Severe stenosis distal right vertebral artery. Mild to moderate stenosis in the posterior cerebral artery bilaterally. Electronically Signed   By: Franchot Gallo M.D.   On: 06/30/2017 12:51   Mr Jodene Nam Neck W Wo Contrast  Result Date: 06/30/2017 CLINICAL DATA:  Stroke EXAM: MRA NECK WITHOUT AND WITH CONTRAST TECHNIQUE: Multiplanar and multiecho pulse sequences of the neck were obtained without and with intravenous contrast. Angiographic images of the neck were obtained using MRA technique without and with intravenous contrast. CONTRAST:  58mL MULTIHANCE GADOBENATE DIMEGLUMINE 529 MG/ML IV SOLN COMPARISON:  MRA head from today FINDINGS: Antegrade flow in carotid and vertebral arteries bilaterally. Left vertebral artery dominant and widely patent Small right vertebral artery with moderate to severe stenosis proximally and  distally. This appears to have a small contribution to the basilar Carotid bifurcation widely patent bilaterally without carotid stenosis. IMPRESSION: Negative for carotid stenosis in the neck Dominant left vertebral artery widely patent. Small right vertebral artery with moderate to severe stenosis proximally and distally. Electronically Signed   By: Franchot Gallo M.D.  On: 06/30/2017 12:53   Mr Brain Wo Contrast  Result Date: 06/28/2017 CLINICAL DATA:  Vertigo, suspect stroke.  Fever and cough. EXAM: MRI HEAD WITHOUT CONTRAST TECHNIQUE: Multiplanar, multiecho pulse sequences of the brain and surrounding structures were obtained without intravenous contrast. COMPARISON:  MRI of the head June 25, 2006 FINDINGS: Mild motion degraded examination. INTRACRANIAL CONTENTS: 3 LEFT cerebellar foci of reduced diffusion measuring to 14 mm with low ADC values. No susceptibility artifact to suggest hemorrhage. Old bilateral basal ganglia infarcts new from prior MRI. Old small bilateral basal ganglia and thalami infarcts. Mild ex vacuo dilatation subjacent lateral ventricle. No parenchymal brain volume loss for age. No hydrocephalus. No suspicious parenchymal signal, masses, mass effect. No abnormal extra-axial fluid collections. No extra-axial masses. VASCULAR: Normal major intracranial vascular flow voids present at skull base. SKULL AND UPPER CERVICAL SPINE: No abnormal sellar expansion. No suspicious calvarial bone marrow signal. Craniocervical junction maintained. SINUSES/ORBITS: Mild paranasal sinus mucosal thickening without air-fluid levels. Mastoid air cells are well aerated.The included ocular globes and orbital contents are non-suspicious. OTHER: None. IMPRESSION: 1. Three acute nonhemorrhagic small LEFT cerebellar infarcts. 2. Old small bilateral cerebellar infarcts. Old basal ganglia and thalamus infarcts. Electronically Signed   By: Elon Alas M.D.   On: 06/28/2017 21:16   Mr Brain W  Contrast  Result Date: 06/30/2017 CLINICAL DATA:  Stroke EXAM: MRI HEAD WITH CONTRAST TECHNIQUE: Multiplanar, multiecho pulse sequences of the brain and surrounding structures were obtained with intravenous contrast. CONTRAST:  18mL MULTIHANCE GADOBENATE DIMEGLUMINE 529 MG/ML IV SOLN COMPARISON:  06/28/2017 FINDINGS: Postcontrast imaging performed. Diffusion-weighted imaging not repeated. Small acute infarct left cerebellum do not enhance. No enhancing mass lesion. Generalized atrophy. Chronic infarcts in the corona radiata bilaterally. IMPRESSION: Normal enhancement postcontrast infusion. Small acute infarcts left cerebellum do not enhance. Electronically Signed   By: Franchot Gallo M.D.   On: 06/30/2017 12:55        Scheduled Meds: . aspirin EC  81 mg Oral Daily  . atorvastatin  40 mg Oral q1800  . heparin  5,000 Units Subcutaneous Q8H  . senna  1 tablet Oral BID   Continuous Infusions: . sodium chloride Stopped (06/30/17 1000)  . cefTRIAXone (ROCEPHIN)  IV Stopped (06/30/17 1621)     LOS: 2 days    Time spent: 69 minutes    Irine Seal, MD [facility] Pager [phone]  If 7PM-7AM, please contact night-coverage [email or web address] Password [facility] 06/30/2017, 5:42 PM

## 2017-06-30 NOTE — Progress Notes (Signed)
OT Cancellation Note  Patient Details Name: Thomas [name] MRN: [id number] DOB: 1955/10/23   Cancelled Treatment:    Reason Eval/Treat Not Completed: Patient at procedure or test/ unavailable;Other (comment)(MRI). Plan to reattempt later today.  Tyrone Schimke OTR/L Pager: [phone]  06/30/2017, 11:24 AM

## 2017-06-30 NOTE — Progress Notes (Signed)
STROKE TEAM PROGRESS NOTE   HISTORY OF PRESENT ILLNESS (per record) Thomas [name] is a 62 y.o. male past medical history of hypertension, presented to the emergency room over at [facility] on 06/28/2017 for sudden onset of weakness, chills and dizziness.  He said that he started having some fevers chills and cough with some vomiting and nausea. He also complained of left-sided chest pain.  He was admitted to [facility] and further workup was initiated. As part of the workup for his current symptoms, an MRI of the brain was done that revealed 3 acute areas of restricted diffusion in the left cerebellum. There was also evidence of old small bilateral cerebellar infarcts and old basal ganglia and thalamic infarcts. Cardiology evaluation for the chest pain revealed possible acute pericarditis, abnormal cardiac troponins. Suspicion for an infectious pericarditis at this time.  Patient continues to complain of some chest discomfort, right shoulder and leg pain.  He also complains of fevers and chills.  He complains of generalized malaise.  Denies any shortness of breath currently. Denies any headaches or visual changes.  Says he had been feeling lightheaded or dizzy off and on for the past almost a week.  LKW: More than 24 hours ago tpa given?: no, outside the window Premorbid modified Rankin scale (mRS): 0    SUBJECTIVE (INTERVAL HISTORY) No family members present.  The patient feels much improved.  He is essentially asymptomatic from a neurological standpoint.  He was able to give a good history and followed all commands.    OBJECTIVE Temp:  [97.5 F (36.4 C)-100.6 F (38.1 C)] 98.2 F (36.8 C) (04/06 0804) Pulse Rate:  [75-99] 75 (04/06 0804) Cardiac Rhythm: Normal sinus rhythm;Bundle branch block (04/06 0700) Resp:  [18-24] 18 (04/06 0804) BP: (115-136)/(73-87) 115/79 (04/06 0804) SpO2:  [93 %-100 %] 100 % (04/06 0804)  CBC:  Recent Labs  Lab 06/28/17 1544  06/29/17 0320 06/30/17 0350  WBC 24.1* 17.3* 18.7*  NEUTROABS 22.9*  --  14.2*  HGB 13.6 13.7 12.9*  HCT 40.2 41.0 37.9*  MCV 78.1 78.1 76.7*  PLT 226 175 127*    Basic Metabolic Panel:  Recent Labs  Lab 06/29/17 0320 06/30/17 0350  NA 136 132*  K 3.9 3.7  CL 106 105  CO2 23 15*  GLUCOSE 105* 102*  BUN 23* 19  CREATININE 1.64* 1.30*  CALCIUM 8.2* 7.9*  MG 2.5* 1.9    Lipid Panel:     Component Value Date/Time   CHOL 128 06/29/2017 0320   TRIG 97 06/29/2017 0320   HDL 37 (L) 06/29/2017 0320   CHOLHDL 3.5 06/29/2017 0320   VLDL 19 06/29/2017 0320   LDLCALC 72 06/29/2017 0320   HgbA1c:  Lab Results  Component Value Date   HGBA1C 5.6 06/29/2017   Urine Drug Screen: No results found for: LABOPIA, COCAINSCRNUR, LABBENZ, AMPHETMU, THCU, LABBARB  Alcohol Level No results found for: [facility]  IMAGING   Dg Chest 2 View 06/28/2017 IMPRESSION:  1. Interstitial lung disease possibly chronic. A component of basilar bronchiectasis most likely present in the lung bases. Pleural scarring.  2. Tortuous thoracic aorta. Heart size normal. No pulmonary venous congestion.     Mr Brain Wo Contrast 06/28/2017 IMPRESSION:  1. Three acute nonhemorrhagic small LEFT cerebellar infarcts.  2. Old small bilateral cerebellar infarcts. Old basal ganglia and thalamus infarcts.     MR MRA Head and Neck - pending   MR Brain with Contrast - pending   Transthoracic Echocardiogram  06/30/2017 Study Conclusions  - Left ventricle: The cavity size was normal. There was moderate   focal basal hypertrophy. Systolic function was normal. The   estimated ejection fraction was in the range of 60% to 65%. Wall   motion was normal; there were no regional wall motion   abnormalities. The study is not technically sufficient to allow   evaluation of LV diastolic function. - Aortic valve: Trileaflet; mildly thickened, mildly calcified   leaflets. There was mild regurgitation.     PHYSICAL  EXAM Vitals:   06/29/17 1952 06/29/17 2031 06/29/17 2358 06/30/17 0804  BP: 130/79 131/73 125/82 115/79  Pulse: 99 98 91 75  Resp: 20 18  18   Temp: (!) 100.5 F (38.1 C) 99.7 F (37.6 C) (!) 100.4 F (38 C) 98.2 F (36.8 C)  TempSrc: Oral Oral Oral Oral  SpO2: 95% 93% 98% 100%  Weight:      Height:       Pleasant middle-aged Asian male not in distress. . Afebrile. Head is nontraumatic. Neck is supple without bruit.    Cardiac exam no murmur or gallop. Lungs are clear to auscultation. Distal pulses are well felt.  Neurological Exam ;  Awake  Alert oriented x 3. Normal speech and language.eye movements full without nystagmus.fundi were not visualized. Vision acuity and fields appear normal. Hearing is normal. Palatal movements are normal. Face symmetric. Tongue midline. Normal strength, tone, reflexes and coordination. Normal sensation. Gait deferred.    ASSESSMENT/PLAN [name] is a 62 y.o. male with history of hypertension, history of tobacco use and previous strokes by imaging presenting with weakness, chills, dizziness, cough, left-sided chest pain, nausea and vomiting. He did not receive IV t-PA due to late presentation.  Strokes: Three acute LEFT cerebellar infarcts possibly septic emboli.  Given positive blood cultures for Streptococcus.  Patient essentially has no focal neurological symptoms or deficits but MRA shows chronic right M1 occlusion, bilateral PCA stenosis and chronic abdominal right vertebral artery stenosis and MRI shows old bilateral basal ganglia lacunar infarcts as well.  Resultant no focal neurological deficits CT head - not performed  MRI head - Three acute nonhemorrhagic small LEFT cerebellar infarcts. Old small bilateral cerebellar infarcts. Old basal ganglia and thalamus infarcts.   MRA H&N -right M1 occlusion.  Bilateral PCA stenosis.  Terminal right vertebral artery stenosis.  MR Brain with Contrast -no enhancing lesions.  Carotid Doppler - MRA  neck no significant extracranial carotid stenosis.  Bilaterally.  2D Echo - EF 60-65%. Cardiac source of emboli identified.  LDL - 72  HgbA1c - 5.6  VTE prophylaxis -subcutaneous heparin Diet Heart Room service appropriate? Yes; Fluid consistency: Thin  No antithrombotic prior to admission, now on aspirin 81 mg daily  Patient counseled to be compliant with his antithrombotic medications  Ongoing aggressive stroke risk factor management  Therapy recommendations:  pending  Disposition:  Pending   Hypertension  Stable . Permissive hypertension (OK if < 220/120) but gradually normalize in 5-7 days . Long-term BP goal normotensive   Hyperlipidemia  Lipid lowering medication PTA: none  LDL 72, goal < 70  Current lipid lowering medication: Lipitor 40 mg daily  Continue statin at discharge   Other Stroke Risk Factors  Advanced age  Cigarette smoker - advised to stop smoking  Hx stroke/TIA   Other Active Problems  Interstitial lung disease possibly chronic by CXR  Fever / leukocytosis - IV Rocephin - Blood cultures -> Streptococcus pneumoniae  Elevated troponins - Cardiology following -  probably related to sepsis   Plan / Recommendations   Stroke workup:  - May need TEE for endocarditis  Therapy Follow Up: pending  Disposition: pending  Antiplatelet / Anticoagulation: aspirin 81 mg daily  Statin: Lipitor 40 mg daily  MD Follow Up: pending  Further risk factor modification per primary care MD: Follow Up 2 weeks   Hospital day # 2  Mikey Bussing PA-C [facility] Pager [phone] 06/30/2017, 1:42 PM I have personally examined this patient, reviewed notes, independently viewed imaging studies, participated in medical decision making and plan of care.ROS completed by me personally and pertinent positives fully documented  I have made any additions or clarifications directly to the above note. Agree with note above.  Patient presented  essentially with symptoms of sepsis from streptococcal bacteremia and brain MRI shows tiny left cerebellar infarcts which are clinically asymptomatic.  These may represent septic emboli if patient has infective endocarditis.  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  Recommend aspirin but check TEE and ID consult for treatment of presumptive endocarditis.  Discussed with Dr. Grandville Silos.  Greater than 50% time during this 35-minute visit was spent on counseling and coordination of care about his cerebellar infarcts, possible T of endocarditis and answering questions.  Antony Contras, MD Medical Director [place] Pager: [phone] 06/30/2017 4:41 PM   To contact Stroke Continuity provider, please refer to [email or web address] After hours, contact General Neurology

## 2017-07-01 ENCOUNTER — Encounter (HOSPITAL_COMMUNITY)
Admission: EM | Disposition: A | Discharge status: Home Health | Payer: Self-pay | Source: Home / Self Care | Attending: Internal Medicine

## 2017-07-01 ENCOUNTER — Inpatient Hospital Stay (HOSPITAL_COMMUNITY): Payer: Private Health Insurance - Indemnity

## 2017-07-01 ENCOUNTER — Inpatient Hospital Stay (HOSPITAL_COMMUNITY): Payer: Private Health Insurance - Indemnity | Admitting: Certified Registered Nurse Anesthetist

## 2017-07-01 ENCOUNTER — Encounter (HOSPITAL_COMMUNITY): Payer: Self-pay | Admitting: Anesthesiology

## 2017-07-01 DIAGNOSIS — I959 Hypotension, unspecified: Secondary | ICD-10-CM

## 2017-07-01 DIAGNOSIS — R42 Dizziness and giddiness: Secondary | ICD-10-CM

## 2017-07-01 DIAGNOSIS — R109 Unspecified abdominal pain: Secondary | ICD-10-CM

## 2017-07-01 DIAGNOSIS — I713 Abdominal aortic aneurysm, ruptured, unspecified: Secondary | ICD-10-CM

## 2017-07-01 DIAGNOSIS — I711 Thoracic aortic aneurysm, ruptured: Secondary | ICD-10-CM

## 2017-07-01 DIAGNOSIS — R1032 Left lower quadrant pain: Secondary | ICD-10-CM

## 2017-07-01 HISTORY — PX: ENDOVASCULAR STENT INSERTION: SHX5161

## 2017-07-01 HISTORY — PX: THROMBECTOMY FEMORAL ARTERY: SHX6406

## 2017-07-01 LAB — BASIC METABOLIC PANEL
Anion gap: 10 (ref 5–15)
BUN: 20 mg/dL (ref 6–20)
CALCIUM: 8.3 mg/dL — AB (ref 8.9–10.3)
CHLORIDE: 106 mmol/L (ref 101–111)
CO2: 19 mmol/L — AB (ref 22–32)
CREATININE: 1.38 mg/dL — AB (ref 0.61–1.24)
GFR calc Af Amer: >60 mL/min (ref >60)
GFR calc non Af Amer: 53 mL/min — ABNORMAL LOW (ref >60)
GLUCOSE: 97 mg/dL (ref 65–99)
Potassium: 3.3 mmol/L — ABNORMAL LOW (ref 3.5–5.1)
Sodium: 135 mmol/L (ref 135–145)

## 2017-07-01 LAB — CBC WITH DIFFERENTIAL/PLATELET
BASOS ABS: 0.1 10*3/uL (ref 0.0–0.1)
Basophils Absolute: 0 10*3/uL (ref 0.0–0.1)
Basophils Relative: 0 %
Basophils Relative: 0 %
EOS PCT: 0 %
Eosinophils Absolute: 0 10*3/uL (ref 0.0–0.7)
Eosinophils Absolute: 0 10*3/uL (ref 0.0–0.7)
Eosinophils Relative: 0 %
HEMATOCRIT: 37.7 % — AB (ref 39.0–52.0)
HEMATOCRIT: 36.4 % — AB (ref 39.0–52.0)
HEMOGLOBIN: 12.9 g/dL — AB (ref 13.0–17.0)
Hemoglobin: 12.4 g/dL — ABNORMAL LOW (ref 13.0–17.0)
LYMPHS PCT: 7 %
Lymphocytes Relative: 16 %
Lymphs Abs: 3.1 10*3/uL (ref 0.7–4.0)
Lymphs Abs: 1.5 10*3/uL (ref 0.7–4.0)
MCH: 25.6 pg — AB (ref 26.0–34.0)
MCH: 25.6 pg — ABNORMAL LOW (ref 26.0–34.0)
MCHC: 34.2 g/dL (ref 30.0–36.0)
MCHC: 34.1 g/dL (ref 30.0–36.0)
MCV: 74.8 fL — ABNORMAL LOW (ref 78.0–100.0)
MCV: 75.1 fL — AB (ref 78.0–100.0)
MONO ABS: 1.6 10*3/uL — AB (ref 0.1–1.0)
MONOS PCT: 8 %
Monocytes Absolute: 1.3 10*3/uL — ABNORMAL HIGH (ref 0.1–1.0)
Monocytes Relative: 6 %
NEUTROS ABS: 14.7 10*3/uL — AB (ref 1.7–7.7)
NEUTROS ABS: 19.9 10*3/uL — AB (ref 1.7–7.7)
Neutrophils Relative %: 76 %
Neutrophils Relative %: 87 %
Platelets: 138 10*3/uL — ABNORMAL LOW (ref 150–400)
Platelets: 155 10*3/uL (ref 150–400)
RBC: 5.04 MIL/uL (ref 4.22–5.81)
RBC: 4.85 MIL/uL (ref 4.22–5.81)
RDW: 14 % (ref 11.5–15.5)
RDW: 14 % (ref 11.5–15.5)
WBC Morphology: INCREASED
WBC: 19.4 10*3/uL — AB (ref 4.0–10.5)
WBC: 22.8 10*3/uL — AB (ref 4.0–10.5)

## 2017-07-01 LAB — POCT I-STAT 7, (LYTES, BLD GAS, ICA,H+H)
ACID-BASE DEFICIT: 7 mmol/L — AB (ref 0.0–2.0)
BICARBONATE: 19.8 mmol/L — AB (ref 20.0–28.0)
CALCIUM ION: 1.07 mmol/L — AB (ref 1.15–1.40)
HCT: 29 % — ABNORMAL LOW (ref 39.0–52.0)
HEMOGLOBIN: 9.9 g/dL — AB (ref 13.0–17.0)
O2 Saturation: 99 %
PH ART: 7.236 — AB (ref 7.350–7.450)
POTASSIUM: 6.1 mmol/L — AB (ref 3.5–5.1)
SODIUM: 138 mmol/L (ref 135–145)
TCO2: 21 mmol/L — ABNORMAL LOW (ref 22–32)
pCO2 arterial: 46.7 mmHg (ref 32.0–48.0)
pO2, Arterial: 160 mmHg — ABNORMAL HIGH (ref 83.0–108.0)

## 2017-07-01 LAB — CULTURE, BLOOD (ROUTINE X 2)
Special Requests: ADEQUATE
Special Requests: ADEQUATE

## 2017-07-01 LAB — COMPREHENSIVE METABOLIC PANEL
ALBUMIN: 2.4 g/dL — AB (ref 3.5–5.0)
ALT: 27 U/L (ref 17–63)
AST: 40 U/L (ref 15–41)
Alkaline Phosphatase: 81 U/L (ref 38–126)
Anion gap: 11 (ref 5–15)
BUN: 23 mg/dL — ABNORMAL HIGH (ref 6–20)
CALCIUM: 7.9 mg/dL — AB (ref 8.9–10.3)
CO2: 16 mmol/L — AB (ref 22–32)
CREATININE: 1.47 mg/dL — AB (ref 0.61–1.24)
Chloride: 105 mmol/L (ref 101–111)
GFR calc Af Amer: 57 mL/min — ABNORMAL LOW (ref >60)
GFR calc non Af Amer: 49 mL/min — ABNORMAL LOW (ref >60)
GLUCOSE: 117 mg/dL — AB (ref 65–99)
Potassium: 4 mmol/L (ref 3.5–5.1)
SODIUM: 132 mmol/L — AB (ref 135–145)
Total Bilirubin: 1.2 mg/dL (ref 0.3–1.2)
Total Protein: 6.4 g/dL — ABNORMAL LOW (ref 6.5–8.1)

## 2017-07-01 LAB — POCT ACTIVATED CLOTTING TIME
Activated Clotting Time: 334 seconds
Activated Clotting Time: 268 seconds

## 2017-07-01 LAB — PREPARE RBC (CROSSMATCH)

## 2017-07-01 LAB — ABO/RH: ABO/RH(D): O POS

## 2017-07-01 LAB — MAGNESIUM: Magnesium: 2 mg/dL (ref 1.7–2.4)

## 2017-07-01 LAB — GLUCOSE, CAPILLARY: Glucose-Capillary: 95 mg/dL (ref 65–99)

## 2017-07-01 LAB — PROTIME-INR
INR: 1.04
Prothrombin Time: 13.5 seconds (ref 11.4–15.2)

## 2017-07-01 SURGERY — ENDOVASCULAR STENT GRAFT INSERTION
Anesthesia: General

## 2017-07-01 MED ORDER — PROPOFOL 10 MG/ML IV BOLUS
INTRAVENOUS | Status: DC | PRN
  Administered 2017-07-01: 50 mg via INTRAVENOUS

## 2017-07-01 MED ORDER — MIDAZOLAM HCL 5 MG/5ML IJ SOLN
INTRAMUSCULAR | Status: DC | PRN
  Administered 2017-07-01 (×4): 0.5 mg via INTRAVENOUS

## 2017-07-01 MED ORDER — PROTAMINE SULFATE 10 MG/ML IV SOLN
INTRAVENOUS | Status: DC | PRN
  Administered 2017-07-01 (×4): 10 mg via INTRAVENOUS
  Administered 2017-07-01: 20 mg via INTRAVENOUS
  Administered 2017-07-01 (×4): 10 mg via INTRAVENOUS

## 2017-07-01 MED ORDER — DEXAMETHASONE SODIUM PHOSPHATE 10 MG/ML IJ SOLN
INTRAMUSCULAR | Status: AC
Start: 2017-07-01 — End: ?
  Filled 2017-07-01: qty 1

## 2017-07-01 MED ORDER — OXYCODONE-ACETAMINOPHEN 5-325 MG PO TABS
1.0000 | ORAL_TABLET | ORAL | Status: DC | PRN
Start: 2017-07-01 — End: 2017-07-01

## 2017-07-01 MED ORDER — LABETALOL HCL 5 MG/ML IV SOLN: 10.0000 mg | INTRAVENOUS | Status: DC | PRN

## 2017-07-01 MED ORDER — ROCURONIUM BROMIDE 100 MG/10ML IV SOLN
INTRAVENOUS | Status: DC | PRN
  Administered 2017-07-01: 30 mg via INTRAVENOUS

## 2017-07-01 MED ORDER — METOPROLOL TARTRATE 5 MG/5ML IV SOLN: 2.0000 mg | INTRAVENOUS | Status: DC | PRN

## 2017-07-01 MED ORDER — PHENOL 1.4 % MT LIQD: 1.0000 | OROMUCOSAL | Status: DC | PRN

## 2017-07-01 MED ORDER — ACETAMINOPHEN 325 MG PO TABS: 650.0000 mg | ORAL_TABLET | Freq: Once | ORAL | Status: DC

## 2017-07-01 MED ORDER — LIDOCAINE HCL (PF) 1 % IJ SOLN
INTRAMUSCULAR | Status: AC
  Filled 2017-07-01: qty 30

## 2017-07-01 MED ORDER — IOPAMIDOL (ISOVUE-300) INJECTION 61%
INTRAVENOUS | Status: AC
  Filled 2017-07-01: qty 30

## 2017-07-01 MED ORDER — PROTAMINE SULFATE 10 MG/ML IV SOLN
INTRAVENOUS | Status: AC
  Filled 2017-07-01: qty 10

## 2017-07-01 MED ORDER — SUCCINYLCHOLINE CHLORIDE 20 MG/ML IJ SOLN
INTRAMUSCULAR | Status: DC | PRN
  Administered 2017-07-01 (×2): 100 mg via INTRAVENOUS

## 2017-07-01 MED ORDER — ETOMIDATE 2 MG/ML IV SOLN
INTRAVENOUS | Status: DC | PRN
  Administered 2017-07-01: 5 mg via INTRAVENOUS
  Administered 2017-07-01: 15 mg via INTRAVENOUS

## 2017-07-01 MED ORDER — GUAIFENESIN-DM 100-10 MG/5ML PO SYRP
15.0000 mL | ORAL_SOLUTION | ORAL | Status: DC | PRN
Start: 2017-07-01 — End: 2017-07-01

## 2017-07-01 MED ORDER — POTASSIUM CHLORIDE CRYS ER 20 MEQ PO TBCR
40.0000 meq | EXTENDED_RELEASE_TABLET | Freq: Once | ORAL | Status: AC
  Administered 2017-07-01: 40 meq via ORAL
  Filled 2017-07-01: qty 2

## 2017-07-01 MED ORDER — HYDROMORPHONE HCL 1 MG/ML IJ SOLN: 0.2500 mg | INTRAMUSCULAR | Status: DC | PRN

## 2017-07-01 MED ORDER — ONDANSETRON HCL 4 MG/2ML IJ SOLN
INTRAMUSCULAR | Status: AC
Start: 2017-07-01 — End: ?
  Filled 2017-07-01: qty 2

## 2017-07-01 MED ORDER — MORPHINE SULFATE (PF) 4 MG/ML IV SOLN: 4.0000 mg | INTRAVENOUS | Status: DC | PRN

## 2017-07-01 MED ORDER — LACTATED RINGERS IV SOLN: INTRAVENOUS | Status: DC | PRN

## 2017-07-01 MED ORDER — ROCURONIUM BROMIDE 10 MG/ML (PF) SYRINGE
PREFILLED_SYRINGE | INTRAVENOUS | Status: AC
  Filled 2017-07-01: qty 5

## 2017-07-01 MED ORDER — HEPARIN SODIUM (PORCINE) 5000 UNIT/ML IJ SOLN
INTRAMUSCULAR | Status: AC
  Filled 2017-07-01: qty 1.2

## 2017-07-01 MED ORDER — ALBUMIN HUMAN 5 % IV SOLN
INTRAVENOUS | Status: DC | PRN
  Administered 2017-07-01: 19:00:00 via INTRAVENOUS

## 2017-07-01 MED ORDER — HYDRALAZINE HCL 20 MG/ML IJ SOLN: 5.0000 mg | INTRAMUSCULAR | Status: DC | PRN

## 2017-07-01 MED ORDER — LACTATED RINGERS IV SOLN
INTRAVENOUS | Status: DC | PRN
  Administered 2017-07-01: 18:00:00 via INTRAVENOUS

## 2017-07-01 MED ORDER — SODIUM CHLORIDE 0.9 % IV BOLUS
1000.0000 mL | Freq: Once | INTRAVENOUS | Status: AC
  Administered 2017-07-01: 1000 mL via INTRAVENOUS

## 2017-07-01 MED ORDER — IOPAMIDOL (ISOVUE-300) INJECTION 61%
50.0000 mL | Freq: Once | INTRAVENOUS | Status: AC | PRN
  Administered 2017-07-01: 50 mL via INTRAVENOUS

## 2017-07-01 MED ORDER — SODIUM CHLORIDE 0.9 % IV SOLN
INTRAVENOUS | Status: AC
  Filled 2017-07-01: qty 1.2

## 2017-07-01 MED ORDER — HEPARIN SODIUM (PORCINE) 1000 UNIT/ML IJ SOLN
INTRAMUSCULAR | Status: DC | PRN
  Administered 2017-07-01: 6000 [IU] via INTRAVENOUS
  Administered 2017-07-01: 1000 [IU] via INTRAVENOUS
  Administered 2017-07-01: 5000 [IU] via INTRAVENOUS

## 2017-07-01 MED ORDER — FENTANYL CITRATE (PF) 250 MCG/5ML IJ SOLN
INTRAMUSCULAR | Status: AC
  Filled 2017-07-01: qty 5

## 2017-07-01 MED ORDER — DEXMEDETOMIDINE HCL IN NACL 200 MCG/50ML IV SOLN: 0.4000 ug/kg/h | INTRAVENOUS | Status: DC

## 2017-07-01 MED ORDER — LIDOCAINE HCL (PF) 1 % IJ SOLN
INTRAMUSCULAR | Status: DC | PRN
  Administered 2017-07-01: 19 mL

## 2017-07-01 MED ORDER — MORPHINE SULFATE (PF) 4 MG/ML IV SOLN
4.0000 mg | INTRAVENOUS | Status: DC | PRN
  Administered 2017-07-01: 4 mg via INTRAVENOUS
  Filled 2017-07-01: qty 1

## 2017-07-01 MED ORDER — HEPARIN SODIUM (PORCINE) 1000 UNIT/ML IJ SOLN
INTRAMUSCULAR | Status: AC
Start: 2017-07-01 — End: ?
  Filled 2017-07-01: qty 2

## 2017-07-01 MED ORDER — DOCUSATE SODIUM 100 MG PO CAPS: 100.0000 mg | ORAL_CAPSULE | Freq: Every day | ORAL | Status: DC

## 2017-07-01 MED ORDER — SODIUM CHLORIDE 0.9 % IV SOLN
0.0000 ug/min | INTRAVENOUS | Status: DC
  Administered 2017-07-02: 35 ug/min via INTRAVENOUS
  Filled 2017-07-01: qty 4
  Filled 2017-07-01: qty 40

## 2017-07-01 MED ORDER — IODIXANOL 320 MG/ML IV SOLN
INTRAVENOUS | Status: DC | PRN
  Administered 2017-07-01: 64.06 mL via INTRA_ARTERIAL

## 2017-07-01 MED ORDER — MORPHINE SULFATE (PF) 2 MG/ML IV SOLN
2.0000 mg | INTRAVENOUS | Status: DC | PRN
  Administered 2017-07-01 (×2): 2 mg via INTRAVENOUS
  Filled 2017-07-01 (×2): qty 1

## 2017-07-01 MED ORDER — PROPOFOL 10 MG/ML IV BOLUS
INTRAVENOUS | Status: AC
  Filled 2017-07-01: qty 20

## 2017-07-01 MED ORDER — SUCCINYLCHOLINE CHLORIDE 200 MG/10ML IV SOSY
PREFILLED_SYRINGE | INTRAVENOUS | Status: AC
  Filled 2017-07-01: qty 10

## 2017-07-01 MED ORDER — ONDANSETRON HCL 4 MG/2ML IJ SOLN
INTRAMUSCULAR | Status: DC | PRN
  Administered 2017-07-01: 4 mg via INTRAVENOUS

## 2017-07-01 MED ORDER — 0.9 % SODIUM CHLORIDE (POUR BTL) OPTIME
TOPICAL | Status: DC | PRN
  Administered 2017-07-01: 1000 mL

## 2017-07-01 MED ORDER — IOPAMIDOL (ISOVUE-300) INJECTION 61%
INTRAVENOUS | Status: AC
  Filled 2017-07-01: qty 50

## 2017-07-01 MED ORDER — PHENYLEPHRINE HCL 10 MG/ML IJ SOLN
INTRAVENOUS | Status: DC | PRN
  Administered 2017-07-01: 50 ug/min via INTRAVENOUS

## 2017-07-01 MED ORDER — FENTANYL CITRATE (PF) 100 MCG/2ML IJ SOLN: 50.0000 ug | Freq: Once | INTRAMUSCULAR | Status: DC

## 2017-07-01 MED ORDER — SODIUM CHLORIDE 0.9 % IV SOLN
Freq: Once | INTRAVENOUS | Status: AC
  Administered 2017-07-01: 18:00:00 via INTRAVENOUS

## 2017-07-01 MED ORDER — SODIUM CHLORIDE 0.9 % IV SOLN: 500.0000 mL | Freq: Once | INTRAVENOUS | Status: DC | PRN

## 2017-07-01 MED ORDER — DIPHENHYDRAMINE HCL 25 MG PO CAPS: 25.0000 mg | ORAL_CAPSULE | Freq: Once | ORAL | Status: DC

## 2017-07-01 MED ORDER — FENTANYL CITRATE (PF) 100 MCG/2ML IJ SOLN
INTRAMUSCULAR | Status: DC | PRN
  Administered 2017-07-01 (×3): 25 ug via INTRAVENOUS
  Administered 2017-07-01: 50 ug via INTRAVENOUS
  Administered 2017-07-01 (×3): 25 ug via INTRAVENOUS
  Administered 2017-07-01: 50 ug via INTRAVENOUS

## 2017-07-01 MED ORDER — FUROSEMIDE 10 MG/ML IJ SOLN: 20.0000 mg | Freq: Once | INTRAMUSCULAR | Status: DC

## 2017-07-01 MED ORDER — MIDAZOLAM HCL 2 MG/2ML IJ SOLN
INTRAMUSCULAR | Status: AC
  Filled 2017-07-01: qty 2

## 2017-07-01 MED ORDER — MIDAZOLAM HCL 2 MG/2ML IJ SOLN
2.0000 mg | INTRAMUSCULAR | Status: DC | PRN
  Administered 2017-07-02: 2 mg via INTRAVENOUS
  Filled 2017-07-01: qty 2

## 2017-07-01 MED ORDER — PANTOPRAZOLE SODIUM 40 MG IV SOLR
40.0000 mg | Freq: Every day | INTRAVENOUS | Status: DC
  Administered 2017-07-02 – 2017-07-05 (×4): 40 mg via INTRAVENOUS
  Filled 2017-07-01 (×4): qty 40

## 2017-07-01 MED ORDER — CEFAZOLIN SODIUM-DEXTROSE 2-3 GM-%(50ML) IV SOLR
INTRAVENOUS | Status: DC | PRN
  Administered 2017-07-01: 2 g via INTRAVENOUS

## 2017-07-01 MED ORDER — PROMETHAZINE HCL 25 MG/ML IJ SOLN: 6.2500 mg | INTRAMUSCULAR | Status: DC | PRN

## 2017-07-01 MED ORDER — DEXMEDETOMIDINE HCL IN NACL 200 MCG/50ML IV SOLN
INTRAVENOUS | Status: DC | PRN
  Administered 2017-07-01: .2 ug/kg/h via INTRAVENOUS

## 2017-07-01 MED ORDER — SODIUM CHLORIDE 0.9 % IV SOLN
INTRAVENOUS | Status: DC | PRN
  Administered 2017-07-01: 19:00:00 500 mL

## 2017-07-01 MED ORDER — DEXAMETHASONE SODIUM PHOSPHATE 10 MG/ML IJ SOLN
INTRAMUSCULAR | Status: DC | PRN
  Administered 2017-07-01: 5 mg via INTRAVENOUS

## 2017-07-01 MED ORDER — DEXTROSE 5 % IV SOLN
0.0000 ug/min | INTRAVENOUS | Status: DC
  Filled 2017-07-01: qty 4

## 2017-07-01 MED ORDER — MIDAZOLAM HCL 2 MG/2ML IJ SOLN: 2.0000 mg | INTRAMUSCULAR | Status: DC | PRN

## 2017-07-01 MED ORDER — FENTANYL 2500MCG IN NS 250ML (10MCG/ML) PREMIX INFUSION
25.0000 ug/h | INTRAVENOUS | Status: DC
  Administered 2017-07-02: 50 ug/h via INTRAVENOUS
  Filled 2017-07-01: qty 250

## 2017-07-01 MED ORDER — PANTOPRAZOLE SODIUM 40 MG PO TBEC: 40.0000 mg | DELAYED_RELEASE_TABLET | Freq: Every day | ORAL | Status: DC

## 2017-07-01 MED ORDER — IOPAMIDOL (ISOVUE-300) INJECTION 61%: 100.0000 mL | Freq: Once | INTRAVENOUS | Status: DC | PRN

## 2017-07-01 MED ORDER — FENTANYL BOLUS VIA INFUSION
50.0000 ug | INTRAVENOUS | Status: DC | PRN
  Administered 2017-07-02: 50 ug via INTRAVENOUS
  Filled 2017-07-01: qty 50

## 2017-07-01 SURGICAL SUPPLY — 99 items
ADH SKN CLS APL DERMABOND .7 (GAUZE/BANDAGES/DRESSINGS) ×2
ADH SKN CLS LQ APL DERMABOND (GAUZE/BANDAGES/DRESSINGS) ×2
BAG DECANTER FOR FLEXI CONT (MISCELLANEOUS) IMPLANT
BAG ISL DRAPE 18X18 STRL (DRAPES) ×2
BAG ISOLATION DRAPE 18X18 (DRAPES) IMPLANT
BAG SNAP BAND KOVER 36X36 (MISCELLANEOUS) ×2 IMPLANT
BALLN CODA OCL 2-9.0-35-120-3 (BALLOONS)
BALLOON COD OCL 2-9.0-35-120-3 (BALLOONS) IMPLANT
CANISTER SUCT 3000ML PPV (MISCELLANEOUS) ×4 IMPLANT
CATH ANGIO 5F BER2 65CM (CATHETERS) ×2 IMPLANT
CATH BEACON 5 .035 65 KMP TIP (CATHETERS) ×2 IMPLANT
CATH COUDE FOLEY 5CC 14FR (CATHETERS) ×2 IMPLANT
CATH EMB 4FR 80CM (CATHETERS) ×3 IMPLANT
CATH OMNI FLUSH .035X70CM (CATHETERS) ×3 IMPLANT
CLIP TI WIDE RED SMALL 6 (CLIP) ×2 IMPLANT
CLIP VESOCCLUDE MED 24/CT (CLIP) ×2 IMPLANT
CLIP VESOCCLUDE MED 6/CT (CLIP) ×2 IMPLANT
CLIP VESOCCLUDE SM WIDE 24/CT (CLIP) ×4 IMPLANT
COVER BACK TABLE 80X110 HD (DRAPES) ×4 IMPLANT
COVER MAYO STAND STRL (DRAPES) ×4 IMPLANT
DERMABOND ADHESIVE PROPEN (GAUZE/BANDAGES/DRESSINGS) ×2
DERMABOND ADVANCED (GAUZE/BANDAGES/DRESSINGS) ×2
DERMABOND ADVANCED .7 DNX12 (GAUZE/BANDAGES/DRESSINGS) ×2 IMPLANT
DERMABOND ADVANCED .7 DNX6 (GAUZE/BANDAGES/DRESSINGS) ×2 IMPLANT
DEVICE CLOSURE PERCLS PRGLD 6F (VASCULAR PRODUCTS) IMPLANT
DEVICE TORQUE 50000 (MISCELLANEOUS) IMPLANT
DRAIN CHANNEL 10F 3/8 F FF (DRAIN) IMPLANT
DRAIN CHANNEL 10M FLAT 3/4 FLT (DRAIN) IMPLANT
DRAPE ISOLATION BAG 18X18 (DRAPES) ×2
DRYSEAL FLEXSHEATH 12FR 33CM (SHEATH) ×4
DRYSEAL FLEXSHEATH 16FR 33CM (SHEATH) ×2
ELECT REM PT RETURN 9FT ADLT (ELECTROSURGICAL) ×8
ELECTRODE REM PT RTRN 9FT ADLT (ELECTROSURGICAL) ×4 IMPLANT
EVACUATOR 3/16  PVC DRAIN (DRAIN)
EVACUATOR 3/16 PVC DRAIN (DRAIN) IMPLANT
EVACUATOR SILICONE 100CC (DRAIN) IMPLANT
EXCLUDER TNK 23X14.5MMX12CM (Endovascular Graft) IMPLANT
EXCLUDER TRUNK 23X14.5MMX12CM (Endovascular Graft) ×4 IMPLANT
GLOVE BIO SURGEON STRL SZ7.5 (GLOVE) ×6 IMPLANT
GLOVE BIO SURGEONS STER SZ 5.5 (GLOVE) ×3 IMPLANT
GLOVE BIOGEL PI IND STRL 6.5 (GLOVE) IMPLANT
GLOVE BIOGEL PI IND STRL 7.0 (GLOVE) IMPLANT
GLOVE BIOGEL PI IND STRL 7.5 (GLOVE) ×2 IMPLANT
GLOVE BIOGEL PI INDICATOR 6.5 (GLOVE) ×10
GLOVE BIOGEL PI INDICATOR 7.0 (GLOVE) ×14
GLOVE BIOGEL PI INDICATOR 7.5 (GLOVE) ×14
GLOVE SURG SS PI 7.5 STRL IVOR (GLOVE) ×12 IMPLANT
GOWN STRL REUS W/ TWL LRG LVL3 (GOWN DISPOSABLE) ×4 IMPLANT
GOWN STRL REUS W/ TWL XL LVL3 (GOWN DISPOSABLE) ×2 IMPLANT
GOWN STRL REUS W/TWL LRG LVL3 (GOWN DISPOSABLE) ×12
GOWN STRL REUS W/TWL XL LVL3 (GOWN DISPOSABLE) ×12
GRAFT BALLN CATH 65CM (STENTS) IMPLANT
HEMOSTAT SNOW SURGICEL 2X4 (HEMOSTASIS) IMPLANT
INSERT FOGARTY 61MM (MISCELLANEOUS) IMPLANT
INSERT FOGARTY SM (MISCELLANEOUS) IMPLANT
KIT BASIN OR (CUSTOM PROCEDURE TRAY) ×6 IMPLANT
KIT TURNOVER KIT B (KITS) ×4 IMPLANT
LEG CONTRALATERAL 16X12X12 (Vascular Products) ×3 IMPLANT
LEG CONTRALATERAL 16X16X9.5 (Endovascular Graft) ×2 IMPLANT
NDL PERC 18GX7CM (NEEDLE) ×2 IMPLANT
NEEDLE PERC 18GX7CM (NEEDLE) ×4 IMPLANT
NS IRRIG 1000ML POUR BTL (IV SOLUTION) ×4 IMPLANT
PACK ENDOVASCULAR (PACKS) ×4 IMPLANT
PACK PERIPHERAL VASCULAR (CUSTOM PROCEDURE TRAY) ×2 IMPLANT
PAD ARMBOARD 7.5X6 YLW CONV (MISCELLANEOUS) ×8 IMPLANT
PENCIL BUTTON HOLSTER BLD 10FT (ELECTRODE) IMPLANT
PERCLOSE PROGLIDE 6F (VASCULAR PRODUCTS) ×24
PROTECTION STATION PRESSURIZED (MISCELLANEOUS) ×4
SET MICROPUNCTURE 5F STIFF (MISCELLANEOUS) ×3 IMPLANT
SHEATH BRITE TIP 8FR 23CM (SHEATH) ×4 IMPLANT
SHEATH DRYSEAL FLEX 12FR 33CM (SHEATH) ×4 IMPLANT
SHEATH DRYSEAL FLEX 16FR 33CM (SHEATH) IMPLANT
SHEATH PINNACLE 8F 10CM (SHEATH) ×3 IMPLANT
SHIELD RADPAD SCOOP 12X17 (MISCELLANEOUS) ×11 IMPLANT
STATION PROTECTION PRESSURIZED (MISCELLANEOUS) IMPLANT
STENT GRAFT BALLN CATH 65CM (STENTS) ×2
STOPCOCK MORSE 400PSI 3WAY (MISCELLANEOUS) ×4 IMPLANT
SUT ETHILON 3 0 PS 1 (SUTURE) IMPLANT
SUT PROLENE 5 0 C 1 24 (SUTURE) ×8 IMPLANT
SUT PROLENE 6 0 CC (SUTURE) ×28 IMPLANT
SUT VIC AB 2-0 CT1 27 (SUTURE) ×8
SUT VIC AB 2-0 CT1 TAPERPNT 27 (SUTURE) IMPLANT
SUT VIC AB 3-0 SH 27 (SUTURE) ×8
SUT VIC AB 3-0 SH 27X BRD (SUTURE) ×2 IMPLANT
SUT VIC AB 4-0 PS2 18 (SUTURE) ×6 IMPLANT
SUT VICRYL 4-0 PS2 18IN ABS (SUTURE) ×4 IMPLANT
SYR 10ML LL (SYRINGE) ×15 IMPLANT
SYR 20CC LL (SYRINGE) ×8 IMPLANT
SYR 30ML LL (SYRINGE) IMPLANT
SYR 5ML LL (SYRINGE) IMPLANT
SYR MEDRAD MARK V 150ML (SYRINGE) ×2 IMPLANT
SYRINGE 3CC LL L/F (MISCELLANEOUS) ×6 IMPLANT
TOWEL GREEN STERILE (TOWEL DISPOSABLE) ×6 IMPLANT
TRAY FOLEY CATH 14FRSI W/METER (CATHETERS) ×3 IMPLANT
TRAY FOLEY MTR SLVR 16FR STAT (SET/KITS/TRAYS/PACK) ×4 IMPLANT
TUBING HIGH PRESSURE 120CM (CONNECTOR) ×3 IMPLANT
WATER STERILE IRR 1000ML POUR (IV SOLUTION) ×4 IMPLANT
WIRE AMPLATZ SS-J .035X180CM (WIRE) ×6 IMPLANT
WIRE BENTSON .035X145CM (WIRE) ×12 IMPLANT

## 2017-07-01 NOTE — Anesthesia Postprocedure Evaluation (Deleted)
Anesthesia Post Note  Patient: Thomas [name]  Procedure(s) Performed: ENDOVASCULAR STENT GRAFT INSERTION (N/A ) Bilateral IlioFemoral Thrombectomies (Bilateral )     Patient location during evaluation: SICU Anesthesia Type: General Level of consciousness: sedated Pain management: pain level controlled Vital Signs Assessment: post-procedure vital signs reviewed and stable Respiratory status: patient remains intubated per anesthesia plan Cardiovascular status: stable Postop Assessment: no apparent nausea or vomiting Anesthetic complications: no    Last Vitals:  Vitals:   07/01/17 1700 07/01/17 2325  BP: 91/78   Pulse:    Resp:    Temp:    SpO2: 100% 100%    Last Pain:  Vitals:   07/01/17 1550  TempSrc: Oral  PainSc:                  Thomas [name]

## 2017-07-01 NOTE — Significant Event (Signed)
Rapid Response Event Note  Overview: Time Called: 1636 Arrival Time: 1638 Event Type: Hypotension  Initial Focused Assessment: Patient complaining of sever abdominal pain today.  CT scan of abd/pelvis done. Per MD 6.6cm Aneurysm with retroperitoneal hematoma Patient is fully alert and oriented BP 89/67  HR 115  RR 24  O2 sat 100 Dr Trula Slade and Dr Grandville Silos at bedside.  Interventions: 1L NS bolus BP 95/68  HR 115 NSL started Right FA, 20ga Blood consent signed  OR consent signed. Levophed at bedside,  Will start if SBP <90  Transported to OR Infiltration noted Left FA IV,  Staff to remove catheter Fluids now infusing via right PIV  Plan of Care (if not transferred):  Event Summary: Name of Physician Notified: Dr Grandville Silos at bedside at    Name of Consulting Physician Notified: Trula Slade at bedside at    Outcome: Transferred (Comment)     Raliegh Ip

## 2017-07-01 NOTE — Progress Notes (Signed)
STROKE TEAM PROGRESS NOTE   HISTORY OF PRESENT ILLNESS (per record) Thomas [name] is a 62 y.o. male past medical history of hypertension, presented to the emergency room over at [facility] on 06/28/2017 for sudden onset of weakness, chills and dizziness.  He said that he started having some fevers chills and cough with some vomiting and nausea. He also complained of left-sided chest pain.  He was admitted to [facility] and further workup was initiated. As part of the workup for his current symptoms, an MRI of the brain was done that revealed 3 acute areas of restricted diffusion in the left cerebellum. There was also evidence of old small bilateral cerebellar infarcts and old basal ganglia and thalamic infarcts. Cardiology evaluation for the chest pain revealed possible acute pericarditis, abnormal cardiac troponins. Suspicion for an infectious pericarditis at this time.  Patient continues to complain of some chest discomfort, right shoulder and leg pain.  He also complains of fevers and chills.  He complains of generalized malaise.  Denies any shortness of breath currently. Denies any headaches or visual changes.  Says he had been feeling lightheaded or dizzy off and on for the past almost a week.  LKW: More than 24 hours ago tpa given?: no, outside the window Premorbid modified Rankin scale (mRS): 0    SUBJECTIVE (INTERVAL HISTORY) No family members present.  The patient continues to do well. He complaints of abdominal pain The plan at this time is to ask cardiology to perform a TEE tomorrow.Blood cultures were positive for streptococcus    OBJECTIVE Temp:  [97.9 F (36.6 C)-100.3 F (37.9 C)] 98 F (36.7 C) (04/07 0857) Pulse Rate:  [76-99] 82 (04/07 0857) Cardiac Rhythm: Normal sinus rhythm (04/07 0700) Resp:  [16-18] 18 (04/07 0857) BP: (115-153)/(67-91) 153/91 (04/07 0857) SpO2:  [95 %-100 %] 100 % (04/07 0857) Weight:  [139 lb 8.8 oz (63.3 kg)] 139 lb 8.8 oz  (63.3 kg) (04/07 0552)  CBC:  Recent Labs  Lab 06/30/17 0350 07/01/17 0654  WBC 18.7* 19.4*  NEUTROABS 14.2* 14.7*  HGB 12.9* 12.9*  HCT 37.9* 37.7*  MCV 76.7* 74.8*  PLT 127* 138*    Basic Metabolic Panel:  Recent Labs  Lab 06/30/17 0350 07/01/17 0654  NA 132* 135  K 3.7 3.3*  CL 105 106  CO2 15* 19*  GLUCOSE 102* 97  BUN 19 20  CREATININE 1.30* 1.38*  CALCIUM 7.9* 8.3*  MG 1.9 2.0    Lipid Panel:     Component Value Date/Time   CHOL 128 06/29/2017 0320   TRIG 97 06/29/2017 0320   HDL 37 (L) 06/29/2017 0320   CHOLHDL 3.5 06/29/2017 0320   VLDL 19 06/29/2017 0320   LDLCALC 72 06/29/2017 0320   HgbA1c:  Lab Results  Component Value Date   HGBA1C 5.6 06/29/2017   Urine Drug Screen: No results found for: LABOPIA, COCAINSCRNUR, LABBENZ, AMPHETMU, THCU, LABBARB  Alcohol Level No results found for: [facility]  IMAGING   Dg Chest 2 View 06/28/2017 IMPRESSION:  1. Interstitial lung disease possibly chronic. A component of basilar bronchiectasis most likely present in the lung bases. Pleural scarring.  2. Tortuous thoracic aorta. Heart size normal. No pulmonary venous congestion.     Mr Brain Wo Contrast 06/28/2017 IMPRESSION:  1. Three acute nonhemorrhagic small LEFT cerebellar infarcts.  2. Old small bilateral cerebellar infarcts. Old basal ganglia and thalamus infarcts.    [name] Head Wo Contrast 06/30/2017 IMPRESSION:  Occlusion of the right M1  segment.  Severe stenosis distal right vertebral artery.  Mild to moderate stenosis in the posterior cerebral artery bilaterally.    [name] Neck W Wo Contrast 06/30/2017 IMPRESSION:  Negative for carotid stenosis in the neck  Dominant left vertebral artery widely patent.  Small right vertebral artery with moderate to severe stenosis proximally and distally.   [name] Contrast 06/30/2017 IMPRESSION:  Normal enhancement postcontrast infusion. Small acute infarcts left cerebellum do not enhance.     Transthoracic Echocardiogram  06/30/2017 Study Conclusions  - Left ventricle: The cavity size was normal. There was moderate   focal basal hypertrophy. Systolic function was normal. The   estimated ejection fraction was in the range of 60% to 65%. Wall   motion was normal; there were no regional wall motion   abnormalities. The study is not technically sufficient to allow   evaluation of LV diastolic function. - Aortic valve: Trileaflet; mildly thickened, mildly calcified   leaflets. There was mild regurgitation.     PHYSICAL EXAM Vitals:   07/01/17 0034 07/01/17 0515 07/01/17 0552 07/01/17 0857  BP: 123/80 115/76  (!) 153/91  Pulse: 79 99  82  Resp: 16 16  18   Temp: 97.9 F (36.6 C) 100.3 F (37.9 C)  98 F (36.7 C)  TempSrc: Oral Oral  Oral  SpO2: 97% 95%  100%  Weight:   139 lb 8.8 oz (63.3 kg)   Height:       Pleasant middle-aged Asian male not in distress. . Afebrile. Head is nontraumatic. Neck is supple without bruit.    Cardiac exam no murmur or gallop. Lungs are clear to auscultation. Distal pulses are well felt.  Neurological Exam ;  Awake  Alert oriented x 3. Normal speech and language.eye movements full without nystagmus.fundi were not visualized. Vision acuity and fields appear normal. Hearing is normal. Palatal movements are normal. Face symmetric. Tongue midline. Normal strength, tone, reflexes and coordination. Normal sensation. Gait deferred.    ASSESSMENT/PLAN [name] is a 62 y.o. male with history of hypertension, history of tobacco use and previous strokes by imaging presenting with weakness, chills, dizziness, cough, left-sided chest pain, nausea and vomiting. He did not receive IV t-PA due to late presentation.  Strokes: Three acute LEFT cerebellar infarcts possibly septic emboli.  Given positive blood cultures for Streptococcus.  Patient essentially has no focal neurological symptoms or deficits but MRA shows chronic right M1 occlusion,  bilateral PCA stenosis and chronic abdominal right vertebral artery stenosis and MRI shows old bilateral basal ganglia lacunar infarcts as well.  Resultant no focal neurological deficits CT head - not performed  MRI head - Three acute nonhemorrhagic small LEFT cerebellar infarcts. Old small bilateral cerebellar infarcts. Old basal ganglia and thalamus infarcts.   MRA H&N - right M1 occlusion.  Bilateral PCA stenosis.  Terminal right vertebral artery stenosis.  MR Brain with Contrast - no enhancing lesions.  Carotid Doppler - MRA neck no significant extracranial carotid stenosis.  Bilaterally.  2D Echo - EF 60-65%. Cardiac source of emboli identified.  LDL - 72  HgbA1c - 5.6  VTE prophylaxis -subcutaneous heparin Diet Heart Room service appropriate? Yes; Fluid consistency: Thin Diet NPO time specified Except for: Sips with Meds  No antithrombotic prior to admission, now on aspirin 81 mg daily  Patient counseled to be compliant with his antithrombotic medications  Ongoing aggressive stroke risk factor management  Therapy recommendations: Outpatient physical therapy recommended.  No follow-up OT.  Disposition:  Pending  Hypertension  Stable . Permissive hypertension (OK if < 220/120) but gradually normalize in 5-7 days . Long-term BP goal normotensive   Hyperlipidemia  Lipid lowering medication PTA: none  LDL 72, goal < 70  Current lipid lowering medication: Lipitor 40 mg daily  Continue statin at discharge   Other Stroke Risk Factors  Advanced age  Cigarette smoker - advised to stop smoking  Hx stroke/TIA   Other Active Problems  Interstitial lung disease possibly chronic by CXR  Fever / leukocytosis - IV Rocephin - Blood cultures -> Streptococcus pneumoniae  Elevated troponins - Cardiology following - probably related to sepsis   Plan / Recommendations   Stroke workup:  - Sent message to cardiology for TEE Monday. NPO after midnight.  Therapy  Follow Up: Outpatient physical therapy recommended.  No follow-up OT.  Disposition: pending  Antiplatelet / Anticoagulation: Aspirin 81 mg daily. (Consider adding Plavix secondary to diffuse cerebrovascular disease)  Statin: Lipitor 40 mg daily  MD Follow Up: pending  Further risk factor modification per primary care MD: Follow Up 2 weeks  ID consult Dr Johnnye Sima - antibiotics adjusted. Repeat BC. Await TEE.  Cardiology - Dr Acie Fredrickson - troponin levels likely due to sepsis.   Hospital day # 3  Mikey Bussing [facility] Pager [phone] 07/01/2017, 10:42 AM     Patient presented essentially with symptoms of sepsis from streptococcal bacteremia and brain MRI shows tiny left cerebellar infarcts which are clinically asymptomatic.  These may represent septic emboli if patient has infective endocarditis.  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  Continue  aspirin but check TEE  for presumptive endocarditis.  Discussed with Dr. Grandville Silos.  Greater than 50% time during this 25-minute visit was spent on counseling and coordination of care about his cerebellar infarcts, possibility of endocarditis and answering questions. Antony Contras, MD Medical Director [facility] Pager: [phone] 07/01/2017 11:30 AM  To contact Stroke Continuity provider, please refer to [email or web address] After hours, contact General Neurology

## 2017-07-01 NOTE — Progress Notes (Signed)
Progress Note   Subjective   Chest pain is resolved.  He reports severe abdominal pain.  Inpatient Medications    Scheduled Meds: . aspirin EC  81 mg Oral Daily  . atorvastatin  40 mg Oral q1800  . heparin  5,000 Units Subcutaneous Q8H  . iopamidol      . senna  1 tablet Oral BID   Continuous Infusions: . sodium chloride Stopped (06/30/17 1000)  . cefTRIAXone (ROCEPHIN)  IV Stopped (07/01/17 0420)   PRN Meds: acetaminophen **OR** acetaminophen, acetaminophen, magnesium citrate, meclizine, morphine injection, nitroGLYCERIN, ondansetron (ZOFRAN) IV, oxyCODONE, polyethylene glycol, sorbitol, zolpidem   Vital Signs    Vitals:   07/01/17 0515 07/01/17 0552 07/01/17 0857 07/01/17 1120  BP: 115/76  (!) 153/91 137/83  Pulse: 99  82 92  Resp: 16  18 18   Temp: 100.3 F (37.9 C)  98 F (36.7 C) 98.7 F (37.1 C)  TempSrc: Oral  Oral Oral  SpO2: 95%  100% 94%  Weight:  139 lb 8.8 oz (63.3 kg)    Height:        Intake/Output Summary (Last 24 hours) at 07/01/2017 1150 Last data filed at 07/01/2017 0420 Gross per 24 hour  Intake 200 ml  Output -  Net 200 ml   Filed Weights   06/28/17 2051 06/29/17 0500 07/01/17 0552  Weight: 141 lb (64 kg) 141 lb 1.5 oz (64 kg) 139 lb 8.8 oz (63.3 kg)    Telemetry    sinus - Personally Reviewed  Physical Exam   GEN- The patient is ill appearing, alert  Head- normocephalic, atraumatic Eyes-  Sclera clear, conjunctiva pink Ears- hearing intact Oropharynx- clear Neck- supple, Lungs- Clear to ausculation bilaterally, normal work of breathing Heart- Regular rate and rhythm  GI- moderate LLQ abdominal pain, + BS Extremities- no clubbing, cyanosis, or edema  MS- no significant deformity or atrophy Skin- no rash or lesion Psych- euthymic mood, full affect Neuro- strength and sensation are intact   Labs    Chemistry Recent Labs  Lab 06/29/17 0320 06/30/17 0350 07/01/17 0654  NA 136 132* 135  K 3.9 3.7 3.3*  CL 106 105 106    CO2 23 15* 19*  GLUCOSE 105* 102* 97  BUN 23* 19 20  CREATININE 1.64* 1.30* 1.38*  CALCIUM 8.2* 7.9* 8.3*  GFRNONAA 43* 57* 53*  GFRAA 50* >60 >60  ANIONGAP 7 12 10      Hematology Recent Labs  Lab 06/29/17 0320 06/30/17 0350 07/01/17 0654  WBC 17.3* 18.7* 19.4*  RBC 5.25 4.94 5.04  HGB 13.7 12.9* 12.9*  HCT 41.0 37.9* 37.7*  MCV 78.1 76.7* 74.8*  MCH 26.1 26.1 25.6*  MCHC 33.4 34.0 34.2  RDW 14.4 14.6 14.0  PLT 175 127* 138*    Cardiac Enzymes Recent Labs  Lab 06/28/17 1544 06/28/17 2120 06/29/17 0320 06/29/17 0904  TROPONINI 0.34* 0.45* 0.41* 0.34*    Recent Labs  Lab 06/28/17 1552  TROPIPOC 0.33*        Assessment & Plan    1.  Sepsis syndrome I have spoken with Dr Grandville Silos this am and also reviewed Dr Clydene Fake notes.  I agree with Dr Leonie Man that given multiple embolic findings on head MRI, positive blood cultures for streptococcus, and now abdominal pain that septic emboli may be present and that further evaluation for infectious endocarditis is prudent.  I will place on schedule for TEE tentatively for tomorrow.  ID is also following  2. Pericarditis No effusion on  recent echo clinically improved  Thompson Grayer MD, [facility] 07/01/2017 11:50 AM

## 2017-07-01 NOTE — Op Note (Signed)
Patient name: Thomas [name] MRN: [id number] DOB: [birth year] Sex: male  07/01/2017 Pre-operative Diagnosis: Ruptured abdominal aortic aneurysm Post-operative diagnosis:  Same Surgeon:  Annamarie Major Assistants:  Arlee Muslim Procedure:   #1: Endovascular repair of ruptured abdominal aortic aneurysm   #2: Bilateral ultrasound-guided common femoral artery access   #3: Catheter in aorta x2   #4: Abdominal aortogram   #5: Bilateral open common femoral artery exposure   #6: Bilateral iliofemoral endarterectomy   #7: Bilateral iliac, femoral, superficial femoral, and popliteal artery thromboembolectomy  Anesthesia: General Blood Loss:  200 cc Specimens: None  Findings: The patient had severe irregularity within bilateral common iliac arteries with significant stenosis.  There was complete exclusion of the aneurysm sac after repair.  Upon examining his feet after the operation, he did not have good Doppler signals.  Because I had difficulty getting the probe glide devices in place, I suspected that this was the source of his problem as he did not have palpable femoral pulses at this time therefore bilateral longitudinal femoral incisions were performed.  After removing the pro glides on both sides there was intima that had been heaped up causing the occlusion.  I was able to perform endarterectomy to remove the intima which was causing the occlusion.  The patient had occlusion of his right hypogastric artery and therefore the right limb of the graft occluded which required embolectomy of the entire limb of the right graft.  I also passed catheters down both femoral-popliteal arteries and removed thrombus.  The femoral arteries were closed primarily.  He had Doppler signals at the end of the case at the feet.  Devices used: Main body was primary right Gore 23 x 14 x 12.  Ipsilateral extension was a Gore 16 x 10.  Contralateral left was a Gore 12 x 12.  Indications: The patient was admitted for presumed  infective pericarditis.  He has been on antibiotics for several days.  He reports a 4-day history of abdominal pain which got acutely worse today.  A CT scan was performed which showed a large left-sided retroperitoneal hematoma and a 6.6 cm aneurysm.  It was presumed that this was a ruptured aneurysm and therefore he was taken emergently to the operating room.  Procedure:  The patient was identified in the holding area and taken to [facility]  The patient was then placed supine on the table. The patient was prepped and draped in the usual sterile fashion.  A time out was called and antibiotics were administered.  Prior to intubation, I used 1% lidocaine to anesthetize the groins.  I made a skin incision on the left.  I used ultrasound guided access to cannulate the left femoral artery with a micropuncture needle.  I had difficulty advancing the micropuncture wire.  I placed a micropuncture sheath followed by a Bentson wire and a Kumpe catheter.  I again had difficulty getting a Bentson wire into the aorta and performed a retrograde arteriogram which showed diffuse luminal irregularity and stenosis within the left common iliac artery.  I was ultimately able to navigate a Bentson wire into the aorta.  An 8 French sheath was used to dilate the subcutaneous tissue and pro glides were placed in the left groin with a fair amount of difficulty.  I then placed an 8 [place] sheath.  An Amplatz superstiff wire was inserted and a 12 French sheath was advanced into the aorta to the level of L1.  Over the Amplatz superstiff wire,  a MOB balloon was inserted.  At this point in time, general anesthesia and intubation was performed.  A glide scope was required as intubation was difficult.  The patient remained hemodynamically stable and I never had to inflate the occlusion balloon.  I then evaluated the right femoral artery with ultrasound.  A 11 blade was used to make a skin nick.  The right common femoral artery was then  cannulated under ultrasound guidance with a micropuncture needle.  I again had difficulty advancing the micropuncture wire.  I placed a micropuncture sheath followed by a Bentson wire and a Kumpe catheter was used to navigate the wire into the aorta.  A wire exchange was performed and an Amplatz superstiff wire was placed.  I then advanced a 72 French sheath into the aorta.  At this point in time the patient was fully heparinized.  The main body device was prepared on the back table.  This was a Gore 23 x 14 x 12 device.  An arteriogram was performed with the catheter on the left side.  This delineated the renal arteries.  There did not appear to be any active hemorrhage at this time.  The main body device was then deployed down to the contralateral gate.  I did have to reposition this to make sure that it was in good position.  Next an Amplatz superstiff wire was placed through the Omni Flush catheter which was removed.  A second access to the 12 French sheath on the left side was performed with a Bentson wire.  Using a Kumpe catheter and a Bentson wire the contralateral gate was cannulated.  Cannulation was confirmed by being able to freely rotate an Omni Flush catheter within the main body.  Next the image detector was rotated to a right anterior oblique position and a retrograde sheath injection was performed locating the left hypogastric artery.  The left limb of the graft was then prepared on the back table.  This was a Gore 12 x 12 device.  The dilator was then reinserted into the 12 French sheath which was advanced into the contralateral gate.  The contralateral limb was then inserted and deployed landing at the level of the left hypogastric artery.  Next, the remaining portion of the ipsilateral limb was deployed.  The delivery system of the main body was removed.  A retrograde injection was performed through the sheath in the right groin and a left anterior oblique position locating the right hypogastric  artery.  The ipsilateral extension was then prepared on the back table.  This was a Gore 16 x 10 device.  It was then deployed landing proximal to the right hypogastric artery.  Next, a MOB balloon was used to mold the proximal and distal attachment sites as well as the device overlap.  When I molded the right limb of the graft, the stenosis within the iliac artery caused a proximal migration of the right limb.  Next, the Omni Flush catheter was reinserted and a completion arteriogram was performed.  This showed complete exclusion of the aneurysm sac with preserved patency of bilateral renal arteries.  The left hypogastric artery was also patent.  I did not see the right hypogastric very well and therefore I rotated the image detector to a left anterior oblique position and performed a retrograde injection through the sheath in the right groin.  It appeared that the right hypogastric artery was no longer present.  There was also stenosis within the external iliac artery.  I did not want to place additional material within the patient given concerns over infection and therefore I was satisfied with our exclusion of his aneurysm.  Bentson wires were then placed.  The sheaths were removed and the probe glide devices were used to successfully close the arteriotomy.  50 mg of protamine was given.  Once hemostasis was satisfactory I used cautery to the subtendinous tissue.  Dermabond was placed on the skin.  I used a hand-held Doppler to evaluate Doppler signals in the foot.  I was unable to get Doppler signals.  I reevaluated the groin and I felt a pulse above both femoral artery cannulation sites but not distal.  I felt that given the difficulty placing the pro glides that there was most likely injury to the artery from the pro-glide insertion.  The patient was then reprepped and draped in sterile condition.  I made a longitudinal incision through the previous skin nicks for the arteriotomy.  Cautery was used about  subtenons tissue down to the femoral arteries.  Bilateral superficial femoral profundofemoral arteries were each individually isolated.  There was no significant calcification within either artery.  There is also no pulse distal to the probe glide insertion site.  The patient was then re-heparinized.  Attention was turned first to the left groin.  I used a Henley clamp to occlude the artery proximally and then removed the probe glide devices.  There was heaped up intima within the distal external iliac artery and common femoral artery.  I was able to remove this.  I then inserted a #4 Fogarty catheter proximally and evacuated thrombus.  Several passes were required until no additional thrombus was recovered.  There was excellent inflow at this time.  I then passed a 4 Fogarty catheter down the superficial femoral artery.  I was able to fully held the catheter.  I did evacuate thrombus from the superficial femoral and popliteal artery.  Multiple passes were performed until there was no further thrombus.  There was good backbleeding from the profundofemoral artery.  I then closed the arteriotomy site transversely with 2 running 6-0 Prolene sutures.  At this point there was excellent pulse within the groin and good Doppler signals within the superficial femoral profundofemoral artery.  Next, attention was turned to the right groin.  A Henley clamp was placed proximally and I removed the probe glide devices.  Again there was heaped up intima from the pro-glide insertion.  This was removed.  I passed the Fogarty catheter proximally and evacuated thrombus however I did not get good inflow.  I then brought the image detector back again.  I used a Kumpe catheter and a Bentson wire and with some difficulty I was able to get into the right limb of the graft.  We did not have an over-the-wire Fogarty catheter and therefore I inserted a Amplatz superstiff wire up into the suprarenal aorta.  A 12 French sheath was then inserted  up to the right limb of the graft.  Leaving the Amplatz superstiff wire in place, I inserted a 5 Fogarty catheter through the sheath.  I then perform thrombectomy of the right limb of the graft by removing the 12 French sheath and following the Fogarty catheter which had contrast in the balloon down through the graft.  Several passes were performed and I then had establish excellent inflow.  I then passed a #4 Fogarty catheter through the superficial femoral artery until it was completely hub.  This was done multiple times.  Thrombus was evacuated from the femoral-popliteal artery.  I performed this until no further thrombus was evacuated.  There was good backbleeding from the profundofemoral artery.  I then removed some more intima from the distal external iliac and common femoral artery.  I then closed the arteriotomy with 2 running 6-0 Prolene sutures.  The clamps were released and there was an excellent pulse within the femoral artery.  I then evaluated the pedal vessels and there were adequate Doppler signals in the posterior tibial and dorsalis pedis artery.  50 mg of protamine was then given.  Once hemostasis was satisfactory, the femoral sheath was reapproximated with 2-0 Vicryl.  The subcutaneous tissue was then closed with multiple layers with 3-0 Vicryl followed by 4-0 Vicryl and the skin and Dermabond.  I again rechecked the pedal vessels and there were continued faint but present Doppler signals.  The patient was left intubated and taken back to the ICU because he was such a difficult intubation.   Disposition: To ICU in guarded but somewhat stable condition.   Theotis Burrow, M.D. [facility] Office: [phone] Pager:  [phone]

## 2017-07-01 NOTE — Progress Notes (Signed)
INFECTIOUS DISEASE PROGRESS NOTE  ID: Thomas [name] is a 62 y.o. male with  Principal Problem:   Acute pericarditis: Probable Active Problems:   Chest pain   CAP (community acquired pneumonia): Clinical   Acute renal failure superimposed on stage 3 chronic kidney disease (HCC)   Dehydration   Leukocytosis   Elevated troponin   Dizziness   Vertigo   Bacteremia due to Streptococcus   CVA (cerebral vascular accident) ([place])   LLQ abdominal pain  Subjective: Temps trending down (Tmax 100.4) C/o abd pain. LMQ.   Abtx:  Anti-infectives (From admission, onward)   Start     Dose/Rate Route Frequency Ordered Stop   06/30/17 1600  cefTRIAXone (ROCEPHIN) 2 g in sodium chloride 0.9 % 100 mL IVPB     2 g 200 mL/hr over 30 Minutes Intravenous Every 12 hours 06/30/17 1354     06/29/17 2200  azithromycin (ZITHROMAX) tablet 500 mg  Status:  Discontinued     500 mg Oral Daily at bedtime 06/29/17 1159 06/29/17 1454   06/29/17 1500  cefTRIAXone (ROCEPHIN) 2 g in sodium chloride 0.9 % 100 mL IVPB  Status:  Discontinued     2 g 200 mL/hr over 30 Minutes Intravenous Every 24 hours 06/29/17 1454 06/30/17 1354   06/28/17 2000  cefTRIAXone (ROCEPHIN) 1 g in sodium chloride 0.9 % 100 mL IVPB  Status:  Discontinued     1 g 200 mL/hr over 30 Minutes Intravenous Every 24 hours 06/28/17 1839 06/29/17 1454   06/28/17 2000  azithromycin (ZITHROMAX) 500 mg in sodium chloride 0.9 % 250 mL IVPB  Status:  Discontinued     500 mg 250 mL/hr over 60 Minutes Intravenous Every 24 hours 06/28/17 1839 06/29/17 1159      Medications:  Scheduled: . aspirin EC  81 mg Oral Daily  . atorvastatin  40 mg Oral q1800  . heparin  5,000 Units Subcutaneous Q8H  . iopamidol      . senna  1 tablet Oral BID    Objective: Vital signs in last 24 hours: Temp:  [97.9 F (36.6 C)-100.3 F (37.9 C)] 98.7 F (37.1 C) (04/07 1120) Pulse Rate:  [76-99] 92 (04/07 1120) Resp:  [16-18] 18 (04/07 1120) BP: (115-153)/(67-91)  137/83 (04/07 1120) SpO2:  [94 %-100 %] 94 % (04/07 1120) Weight:  [63.3 kg (139 lb 8.8 oz)] 63.3 kg (139 lb 8.8 oz) (04/07 0552)   General appearance: alert, cooperative and mild distress Resp: clear to auscultation bilaterally Cardio: regular rate and rhythm GI: normal findings: bowel sounds normal, soft, non-tender and no gaurding. no rigidity.   Lab Results Recent Labs    06/30/17 0350 07/01/17 0654  WBC 18.7* 19.4*  HGB 12.9* 12.9*  HCT 37.9* 37.7*  NA 132* 135  K 3.7 3.3*  CL 105 106  CO2 15* 19*  BUN 19 20  CREATININE 1.30* 1.38*   Liver Panel No results for input(s): PROT, ALBUMIN, AST, ALT, ALKPHOS, BILITOT, BILIDIR, IBILI in the last 72 hours. Sedimentation Rate No results for input(s): ESRSEDRATE in the last 72 hours. C-Reactive Protein No results for input(s): CRP in the last 72 hours.  Microbiology: Recent Results (from the past 240 hour(s))  Culture, blood (Routine X 2) w Reflex to ID Panel     Status: Abnormal   Collection Time: 06/28/17  9:10 PM  Result Value Ref Range Status   Specimen Description   Final    BLOOD RIGHT ANTECUBITAL Performed at [facility], [address] [place], [place] [address]    Special Requests   Final    BOTTLES DRAWN AEROBIC AND ANAEROBIC Blood Culture adequate volume Performed at [facility] [address], [place], [place] [address]    Culture  Setup Time   Final    GRAM POSITIVE COCCI IN CHAINS IN BOTH AEROBIC AND ANAEROBIC BOTTLES CRITICAL VALUE NOTED.  VALUE IS CONSISTENT WITH PREVIOUSLY REPORTED AND CALLED VALUE.    Culture (A)  Final    STREPTOCOCCUS PNEUMONIAE SUSCEPTIBILITIES PERFORMED ON PREVIOUS CULTURE WITHIN THE LAST 5 DAYS. Performed at [facility], [place] [address], [place], [place] [address]    Report Status 07/01/2017 FINAL  Final  Culture, blood (Routine X 2) w Reflex to ID Panel     Status: Abnormal   Collection Time: 06/28/17  9:10 PM  Result Value  Ref Range Status   Specimen Description   Final    BLOOD LEFT HAND Performed at [facility] [address] [place], [place] [address]    Special Requests   Final    BOTTLES DRAWN AEROBIC AND ANAEROBIC Blood Culture adequate volume Performed at [facility] [address] [place], [place] [address]    Culture  Setup Time   Final    GRAM POSITIVE COCCI IN CHAINS IN BOTH AEROBIC AND ANAEROBIC BOTTLES CRITICAL RESULT CALLED TO, READ BACK BY AND VERIFIED WITH: D. Wofford PharmD 14:45 06/29/17 ([email or web address]) Performed at [facility], [place] [address] [place], [place] [address]    Culture STREPTOCOCCUS PNEUMONIAE (A)  Final   Report Status 07/01/2017 FINAL  Final   Organism ID, Bacteria STREPTOCOCCUS PNEUMONIAE  Final      Susceptibility   Streptococcus pneumoniae - MIC*    ERYTHROMYCIN <=0.12 SENSITIVE Sensitive     LEVOFLOXACIN 0.5 SENSITIVE Sensitive     PENICILLIN (meningitis) 0.25 RESISTANT Resistant     PENICILLIN (non-meningitis) 0.25 SENSITIVE Sensitive     CEFTRIAXONE (non-meningitis) <=0.12 SENSITIVE Sensitive     CEFTRIAXONE (meningitis) <=0.12 SENSITIVE Sensitive     * STREPTOCOCCUS PNEUMONIAE  Blood Culture ID Panel (Reflexed)     Status: Abnormal   Collection Time: 06/28/17  9:10 PM  Result Value Ref Range Status   Enterococcus species NOT DETECTED NOT DETECTED Final   Listeria monocytogenes NOT DETECTED NOT DETECTED Final   Staphylococcus species NOT DETECTED NOT DETECTED Final   Staphylococcus aureus NOT DETECTED NOT DETECTED Final   Streptococcus species DETECTED (A) NOT DETECTED Final    Comment: CRITICAL RESULT CALLED TO, READ BACK BY AND VERIFIED WITH: D. Wofford PharmD 14:45 06/29/17 ([email or web address])    Streptococcus agalactiae NOT DETECTED NOT DETECTED Final   Streptococcus pneumoniae DETECTED (A) NOT DETECTED Final    Comment: CRITICAL RESULT CALLED TO, READ BACK BY AND VERIFIED WITH: D. Wofford PharmD 14:45 06/29/17 ([email or web address])      Streptococcus pyogenes NOT DETECTED NOT DETECTED Final   Acinetobacter baumannii NOT DETECTED NOT DETECTED Final   Enterobacteriaceae species NOT DETECTED NOT DETECTED Final   Enterobacter cloacae complex NOT DETECTED NOT DETECTED Final   Escherichia coli NOT DETECTED NOT DETECTED Final   Klebsiella oxytoca NOT DETECTED NOT DETECTED Final   Klebsiella pneumoniae NOT DETECTED NOT DETECTED Final   Proteus species NOT DETECTED NOT DETECTED Final   Serratia marcescens NOT DETECTED NOT DETECTED Final   Haemophilus influenzae NOT DETECTED NOT DETECTED Final   Neisseria meningitidis NOT DETECTED NOT DETECTED Final   Pseudomonas aeruginosa NOT DETECTED NOT  DETECTED Final   Candida albicans NOT DETECTED NOT DETECTED Final   Candida glabrata NOT DETECTED NOT DETECTED Final   Candida krusei NOT DETECTED NOT DETECTED Final   Candida parapsilosis NOT DETECTED NOT DETECTED Final   Candida tropicalis NOT DETECTED NOT DETECTED Final    Comment: Performed at [facility], [place] [address] [place], [place] [address]    Studies/Results: Mr Virgel Paling PY Contrast  Result Date: 06/30/2017 CLINICAL DATA:  Stroke EXAM: MRA HEAD WITHOUT CONTRAST TECHNIQUE: Angiographic images of the Circle of Willis were obtained using MRA technique without intravenous contrast. COMPARISON:  MRI head 06/28/2017 FINDINGS: Severe stenosis distal right vertebral artery, better seen on the MRA neck from today. Left vertebral artery patent to the basilar. Moderate stenosis origin of right AICA which contributes to the right PICA. Bilateral PICA patent. Basilar patent. Superior cerebellar and posterior cerebral arteries patent bilaterally. Mild to moderate stenosis in the posterior cerebral artery bilaterally. Internal carotid artery widely patent bilaterally. Occlusion of the proximal right anterior cerebral artery Left middle cerebral artery patent with mild irregularity distally. Anterior cerebral arteries patent bilaterally.  Negative for cerebral aneurysm. IMPRESSION: Occlusion of the right M1 segment. Severe stenosis distal right vertebral artery. Mild to moderate stenosis in the posterior cerebral artery bilaterally. Electronically Signed   By: Franchot Gallo M.D.   On: 06/30/2017 12:51   Mr Jodene Nam Neck W Wo Contrast  Result Date: 06/30/2017 CLINICAL DATA:  Stroke EXAM: MRA NECK WITHOUT AND WITH CONTRAST TECHNIQUE: Multiplanar and multiecho pulse sequences of the neck were obtained without and with intravenous contrast. Angiographic images of the neck were obtained using MRA technique without and with intravenous contrast. CONTRAST:  1mL MULTIHANCE GADOBENATE DIMEGLUMINE 529 MG/ML IV SOLN COMPARISON:  MRA head from today FINDINGS: Antegrade flow in carotid and vertebral arteries bilaterally. Left vertebral artery dominant and widely patent Small right vertebral artery with moderate to severe stenosis proximally and distally. This appears to have a small contribution to the basilar Carotid bifurcation widely patent bilaterally without carotid stenosis. IMPRESSION: Negative for carotid stenosis in the neck Dominant left vertebral artery widely patent. Small right vertebral artery with moderate to severe stenosis proximally and distally. Electronically Signed   By: Franchot Gallo M.D.   On: 06/30/2017 12:53   Mr Brain W Contrast  Result Date: 06/30/2017 CLINICAL DATA:  Stroke EXAM: MRI HEAD WITH CONTRAST TECHNIQUE: Multiplanar, multiecho pulse sequences of the brain and surrounding structures were obtained with intravenous contrast. CONTRAST:  43mL MULTIHANCE GADOBENATE DIMEGLUMINE 529 MG/ML IV SOLN COMPARISON:  06/28/2017 FINDINGS: Postcontrast imaging performed. Diffusion-weighted imaging not repeated. Small acute infarct left cerebellum do not enhance. No enhancing mass lesion. Generalized atrophy. Chronic infarcts in the corona radiata bilaterally. IMPRESSION: Normal enhancement postcontrast infusion. Small acute infarcts left  cerebellum do not enhance. Electronically Signed   By: Franchot Gallo M.D.   On: 06/30/2017 12:55     Assessment/Plan: Penumococcal bacteremia Pericarditis CNS lesion CKD 3   Total days of antibiotics: 3 ceftriaxone  Cr and WBC steady appreciate CV eval for TEE.  Repeat BCx pending.  with abd pain, he needs CT abd (? Splenic infarct)         Bobby Rumpf MD, FACP Infectious Diseases (pager) [phone] [email or web address] 07/01/2017, 12:14 PM  LOS: 3 days

## 2017-07-01 NOTE — Progress Notes (Signed)
Was called by radiology, Dr. Collene Mares cell that patient CT abdomen and pelvis had a infrarenal abdominal aortic aneurysm measuring up to 6.6 cm in maximum diameter with a peri-aneurysmal edema/hemorrhage with a large left sided retroperitoneal hematoma lateral to the left psoas muscle tracking down into the left pelvic sidewall.  Imaging features compatible with aortic aneurysmal rupture.  Probable infarct posterior spleen with small subscapular hematoma. Called and spoke with vascular surgery Dr. Trula Slade who was going to see the patient and likely take him emergently to the operating room. Went and assessed the patient. General: Patient in moderate distress.  Complaint of abdominal pain Respiratory: Lungs clear to auscultation bilaterally anterior lung fields.  No wheezes, no crackles, no rhonchi Cardiovascular: Tachycardic, no murmurs rubs or gallops GI: Abdomen is soft, positive bowel sounds, tender to palpation in the left lower quadrant, no rebound, no guarding. Extremities: No clubbing cyanosis or edema  Patient noted to have systolic blood pressures dropping into the upper 70s with heart rates in the 150s.  Assessment/plan #1 probable ruptured aortic aneurysm CT abdomen and pelvis which was done with a 6.6 cm diameter aortic aneurysm with findings concerning for rupture.  Check a stat CBC, see med, PT/INR, type and screen.  Transfuse 2 units packed red blood cells.  Vascular surgery has been consulted and patient seen in consultation by Dr.Brabham who is going to take the patient to the operating room for further evaluation/management.  N.p.o.  Transfer to ICU postop.  Per vascular surgery.  2.  Hypotension Concern for hemorrhagic shock secondary to problem #1 and volume depletion versus septic shock.  Patient with systolic blood pressures in the high 70s.  Check a CBC stat.  Check a comprehensive metabolic profile.  1 L bolus normal saline.  Place on a Levophed drip.  Type and screen.  Transfuse  2 units packed red blood cells.  Consult with critical care for further evaluation and management.  Continue empiric IV Rocephin as patient with a Streptococcus bacteremia with concerns for endocarditis.  Repeat blood cultures pending.  Critical care time spent : 55 minutes.

## 2017-07-01 NOTE — Transfer of Care (Signed)
Immediate Anesthesia Transfer of Care Note  Patient: Thomas [name]  Procedure(s) Performed: ENDOVASCULAR STENT GRAFT INSERTION (N/A ) Bilateral IlioFemoral Thrombectomies (Bilateral )  Patient Location: SICU  Anesthesia Type:General  Level of Consciousness: sedated  Airway & Oxygen Therapy: Patient remains intubated per anesthesia plan and Patient placed on Ventilator (see vital sign flow sheet for setting)  Post-op Assessment: Report given to RN and Post -op Vital signs reviewed and stable  Post vital signs: Reviewed and stable  Last Vitals:  Vitals Value Taken Time  BP    Temp    Pulse 60 07/01/2017 11:36 PM  Resp 15 07/01/2017 11:36 PM  SpO2 100 % 07/01/2017 11:36 PM  Vitals shown include unvalidated device data.  Last Pain:  Vitals:   07/01/17 1550  TempSrc: Oral  PainSc:       Patients Stated Pain Goal: 3 (76/19/50 9326)  Complications: No apparent anesthesia complications

## 2017-07-01 NOTE — Anesthesia Procedure Notes (Signed)
Central Venous Catheter Insertion Performed by: Duane Boston, MD, anesthesiologist Start/End4/09/2017 5:44 PM, 07/01/2017 5:54 PM Patient location: Pre-op. Preanesthetic checklist: patient identified, IV checked, site marked, risks and benefits discussed, surgical consent, monitors and equipment checked, pre-op evaluation, timeout performed and anesthesia consent Position: Trendelenburg Lidocaine 1% used for infiltration and patient sedated Hand hygiene performed , maximum sterile barriers used  and Seldinger technique used Catheter size: 8 Fr Total catheter length 16. Central line was placed.Double lumen Procedure performed using ultrasound guided technique. Ultrasound Notes:image(s) printed for medical record Attempts: 1 Following insertion, dressing applied, line sutured and Biopatch. Post procedure assessment: blood return through all ports, free fluid flow and no air  Patient tolerated the procedure well with no immediate complications.

## 2017-07-01 NOTE — OR Nursing (Signed)
1835-Attempted Insertion of 16 French Foley but resistance met. 39 French foley insertion attempted but slight resistance met as well.  Dr. Trula Slade at bedside and aware.  Per Dr. Trula Slade and under his guidance a 90 French Coude was inserted without difficulty.  Amber colored urine returned.

## 2017-07-01 NOTE — Progress Notes (Signed)
PROGRESS NOTE    Arslan Kier  IPJ:[id number] DOB: 01-13-1956 DOA: 06/28/2017 PCP: Patient, No Pcp Per    Brief Narrative:  Mavric Cortright is a 62 year old male with medical history significant for tobacco abuse otherwise unremarkable.  Patient presented to the emergency department with sudden onset of chest pain, fevers, chills and nonproductive cough.  All other associated symptoms include vertigo and generalized weakness.  Patient was seen at [facility] where EKG was done and showed sinus rhythm with right bundle branch block and ST segment elevation in anterior leads, therefore patient was transferred to [facility] for further workup.  Cardiology evaluated patient and suspected pericarditis.  In the ED was found to have chest x-ray with vascular bronchiectasis, influenza A/B was negative, leukocytosis and elevated creatinine of 1.74.  Cardiac enzymes with elevated troponin.  And UA was negative.  Patient was admitted with working diagnosis of pneumonia and acute pericarditis.  Given vertigo MRI was performed which showed 3 acute nonhemorrhagic left cerebellar old infarct.  Neurology was consulted and recommended transfer to [facility] for embolic stroke workup. Cultures obtained consistent with bacteremia secondary to Streptococcus.  ID consulted.  Patient likely needs a TEE.  Patient with probable bacterial endocarditis.     Assessment & Plan:   Principal Problem:   Acute pericarditis: Probable Active Problems:   Bacteremia due to Streptococcus   CAP (community acquired pneumonia): Clinical   CVA (cerebral vascular accident) ([place])   Chest pain   Acute renal failure superimposed on stage 3 chronic kidney disease (HCC)   Dehydration   Leukocytosis   Elevated troponin   Dizziness   Vertigo  #1 chest pain secondary to suspected pericarditis Patient was seen on admission in consultation with cardiology and felt likely had a pericarditis.  2D echo obtained with a normal EF with  no wall motion abnormalities.  Patient started on aspirin 81 mg daily.  No other NSAIDs at this time.  Troponin trend was flat.  Patient also on empiric IV antibiotics.  Cardiology following and appreciate input and recommendations.  2.  Clinical community-acquired pneumonia Chest x-ray done on admission did have interstitial lung disease and concern for infiltrate in the left lower lobe.  Patient had presented with fevers chills and a cough.  Urine Streptococcus antigen was positive.  Leukocytosis fluctuating and initially went up as high as 24.1 and trending back down and currently at 19.4 from 18.7.  Blood cultures positive for Streptococcus and concerned that patient may have endocarditis.  IV azithromycin has been discontinued.  IV Rocephin dose was increased to 2 g daily.  ID has been consulted and are following.  Blood cultures have been repeated and are pending.  Follow.    3.  Bacteremia secondary to Streptococcus Patient presented with chest pain, febrile illness, concerns for community-acquired pneumonia with a left lower lobe infiltrate.  Patient also with concerns for pericarditis.  MRI of the head done consistent with 3 nonhemorrhagic acute infarcts.  Pro-calcitonin elevated at 39.90.  Concern for endocarditis.  IV Rocephin dose has been increased to 2 g daily.  2D echo negative for any vegetations.  Patient likely needs a TEE.  Cardiology following and hopefully TEE can be arranged on Monday, 07/02/2017.  ID has been consulted and also in agreement with need for TEE.  Repeat blood cultures have been ordered and currently pending.  Appreciate ID input and recommendations.    4.  Acute CVA Patient on presentation had complaints of dizziness that has been  ongoing.  MRI of the brain which was done showed 3 acute nonhemorrhagic small left cerebellar infarcts.  Old small bilateral cerebellar infarcts.  Old basal ganglia and thalamus infarcts.  Patient noted to have a febrile illness T-max of 100.5  over the past 24 hours.  Blood cultures positive for Streptococcus.  Urine strep pneumococcus antigen was positive and patient also being treated for clinically acquired pneumonia.  Concern for septic emboli.  MRA of the neck negative for carotid stenosis, dominant left vertebral artery widely patent, small right ventral artery with moderate to severe stenosis proximally and distally.  MRA brain abnormal enhancement post contrast infusion, small acute infarct left cerebellum do not enhance. 2D echo done with EF of 60-65%, no wall motion abnormalities.  Patient likely needs a TEE. Hemoglobin A1c 5.6.  LDL of 72.  Patient started on Lipitor 40 mg daily.  Will need a statin on discharge.  Neurology following and appreciate input and recommendations.  5.  Acute on chronic kidney disease stage III Secondary to prerenal azotemia.  Renal function improving with hydration.  Follow.  6.  Left lower quadrant abdominal pain Patient complained of significant left lower quadrant abdominal pain and some mild distress.  Concern for infarction secondary to possible septic emboli.  Will get a CT abdomen and pelvis with half contrast load for further evaluation and management.  Continue current empiric IV antibiotics.  7.  Hypokalemia Replete.   DVT prophylaxis: Heparin. Code Status: Full Family Communication: Updated patient.  No family at bedside. Disposition Plan: Home Once workup is complete with clinical improvement.   Consultants:   Cardiology: Dr.Croitoru 06/28/2017  Neurology: Dr.Arora 06/29/2017  Infectious disease: Dr. Johnnye Sima 06/30/2017  Procedures:   MRI head 06/28/2017  MRA head and neck pending 06/30/2017  2D echo 06/29/2017  Chest x-ray 06/28/2017  CT abdomen and pelvis pending 07/01/2017  Antimicrobials:   IV Rocephin 06/28/2017  IV azithromycin for 06/2017>>>> 06/29/2017   Subjective: Patient laying in bed in some mild distress complaining of left lower quadrant abdominal pain.  Denies any  nausea or emesis.  Denies any worsening shortness of breath.  Denies any chest pain.  Patient denies any dizziness.  States has been able to have bowel movement.    Objective: Vitals:   07/01/17 0034 07/01/17 0515 07/01/17 0552 07/01/17 0857  BP: 123/80 115/76  (!) 153/91  Pulse: 79 99  82  Resp: 16 16  18   Temp: 97.9 F (36.6 C) 100.3 F (37.9 C)  98 F (36.7 C)  TempSrc: Oral Oral  Oral  SpO2: 97% 95%  100%  Weight:   63.3 kg (139 lb 8.8 oz)   Height:        Intake/Output Summary (Last 24 hours) at 07/01/2017 0949 Last data filed at 06/30/2017 1600 Gross per 24 hour  Intake 1753.33 ml  Output -  Net 1753.33 ml   Filed Weights   06/28/17 2051 06/29/17 0500 07/01/17 0552  Weight: 64 kg (141 lb) 64 kg (141 lb 1.5 oz) 63.3 kg (139 lb 8.8 oz)    Examination:  General exam: In mild distress. Respiratory system: Coarse breath sounds in the lung bases.  No crackles.  No wheezing.   Respiratory effort normal. Cardiovascular system: Regular rate and rhythm no murmurs rubs or gallops.  No lower extremity edema. Gastrointestinal system: Abdomen is soft, nontender, nondistended, positive bowel sounds.  No rebound.  No guarding. Central nervous system: Alert and oriented. No focal neurological deficits. Extremities: Symmetric 5 x 5 power.  Skin: No rashes, lesions or ulcers Psychiatry: Judgement and insight appear normal. Mood & affect appropriate.     Data Reviewed: I have personally reviewed following labs and imaging studies  CBC: Recent Labs  Lab 06/28/17 1335 06/28/17 1544 06/29/17 0320 06/30/17 0350 07/01/17 0654  WBC 21.8* 24.1* 17.3* 18.7* 19.4*  NEUTROABS  --  22.9*  --  14.2* 14.7*  HGB 14.5 13.6 13.7 12.9* 12.9*  HCT 44.4 40.2 41.0 37.9* 37.7*  MCV 77.8* 78.1 78.1 76.7* 74.8*  PLT  --  226 175 127* 573*   Basic Metabolic Panel: Recent Labs  Lab 06/28/17 1544 06/28/17 2110 06/29/17 0320 06/30/17 0350 07/01/17 0654  NA 137  --  136 132* 135  K 3.8  --  3.9  3.7 3.3*  CL 106  --  106 105 106  CO2 23  --  23 15* 19*  GLUCOSE 107*  --  105* 102* 97  BUN 25*  --  23* 19 20  CREATININE 1.74*  --  1.64* 1.30* 1.38*  CALCIUM 8.4*  --  8.2* 7.9* 8.3*  MG 1.4* 1.5* 2.5* 1.9 2.0   GFR: Estimated Creatinine Clearance: 47.7 mL/min (A) (by C-G formula based on SCr of 1.38 mg/dL (H)). Liver Function Tests: No results for input(s): AST, ALT, ALKPHOS, BILITOT, PROT, ALBUMIN in the last 168 hours. No results for input(s): LIPASE, AMYLASE in the last 168 hours. No results for input(s): AMMONIA in the last 168 hours. Coagulation Profile: Recent Labs  Lab 06/29/17 0320  INR 1.23   Cardiac Enzymes: Recent Labs  Lab 06/28/17 1544 06/28/17 2120 06/29/17 0320 06/29/17 0904  TROPONINI 0.34* 0.45* 0.41* 0.34*   BNP (last 3 results) No results for input(s): PROBNP in the last 8760 hours. HbA1C: Recent Labs    06/29/17 0320  HGBA1C 5.6   CBG: Recent Labs  Lab 06/28/17 1625 06/29/17 0813 06/30/17 0731 07/01/17 0632  GLUCAP 115* 90 99 95   Lipid Profile: Recent Labs    06/29/17 0320  CHOL 128  HDL 37*  LDLCALC 72  TRIG 97  CHOLHDL 3.5   Thyroid Function Tests: Recent Labs    06/28/17 2110  TSH 0.471   Anemia Panel: No results for input(s): VITAMINB12, FOLATE, FERRITIN, TIBC, IRON, RETICCTPCT in the last 72 hours. Sepsis Labs: Recent Labs  Lab 06/29/17 2128  PROCALCITON 39.90    Recent Results (from the past 240 hour(s))  Culture, blood (Routine X 2) w Reflex to ID Panel     Status: Abnormal   Collection Time: 06/28/17  9:10 PM  Result Value Ref Range Status   Specimen Description   Final    BLOOD RIGHT ANTECUBITAL Performed at [facility] [address] [place], [place] [address]    Special Requests   Final    BOTTLES DRAWN AEROBIC AND ANAEROBIC Blood Culture adequate volume Performed at [facility] [address] [place], [place] [address]    Culture  Setup Time   Final      GRAM POSITIVE COCCI IN CHAINS IN BOTH AEROBIC AND ANAEROBIC BOTTLES CRITICAL VALUE NOTED.  VALUE IS CONSISTENT WITH PREVIOUSLY REPORTED AND CALLED VALUE.    Culture (A)  Final    STREPTOCOCCUS PNEUMONIAE SUSCEPTIBILITIES PERFORMED ON PREVIOUS CULTURE WITHIN THE LAST 5 DAYS. Performed at [facility], [place] [address], [place], [place] [address]    Report Status 07/01/2017 FINAL  Final  Culture, blood (Routine X 2) w Reflex to ID Panel  Status: Abnormal   Collection Time: 06/28/17  9:10 PM  Result Value Ref Range Status   Specimen Description   Final    BLOOD LEFT HAND Performed at [facility] [address], [place], [place] [address]    Special Requests   Final    BOTTLES DRAWN AEROBIC AND ANAEROBIC Blood Culture adequate volume Performed at [facility] [address], [place], [place] [address]    Culture  Setup Time   Final    GRAM POSITIVE COCCI IN CHAINS IN BOTH AEROBIC AND ANAEROBIC BOTTLES CRITICAL RESULT CALLED TO, READ BACK BY AND VERIFIED WITH: D. Wofford PharmD 14:45 06/29/17 ([email or web address]) Performed at [facility], [place] [address], [place], [place] [address]    Culture STREPTOCOCCUS PNEUMONIAE (A)  Final   Report Status 07/01/2017 FINAL  Final   Organism ID, Bacteria STREPTOCOCCUS PNEUMONIAE  Final      Susceptibility   Streptococcus pneumoniae - MIC*    ERYTHROMYCIN <=0.12 SENSITIVE Sensitive     LEVOFLOXACIN 0.5 SENSITIVE Sensitive     PENICILLIN (meningitis) 0.25 RESISTANT Resistant     PENICILLIN (non-meningitis) 0.25 SENSITIVE Sensitive     CEFTRIAXONE (non-meningitis) <=0.12 SENSITIVE Sensitive     CEFTRIAXONE (meningitis) <=0.12 SENSITIVE Sensitive     * STREPTOCOCCUS PNEUMONIAE  Blood Culture ID Panel (Reflexed)     Status: Abnormal   Collection Time: 06/28/17  9:10 PM  Result Value Ref Range Status   Enterococcus species NOT DETECTED NOT DETECTED Final   Listeria monocytogenes NOT DETECTED NOT DETECTED  Final   Staphylococcus species NOT DETECTED NOT DETECTED Final   Staphylococcus aureus NOT DETECTED NOT DETECTED Final   Streptococcus species DETECTED (A) NOT DETECTED Final    Comment: CRITICAL RESULT CALLED TO, READ BACK BY AND VERIFIED WITH: D. Wofford PharmD 14:45 06/29/17 ([email or web address])    Streptococcus agalactiae NOT DETECTED NOT DETECTED Final   Streptococcus pneumoniae DETECTED (A) NOT DETECTED Final    Comment: CRITICAL RESULT CALLED TO, READ BACK BY AND VERIFIED WITH: D. Wofford PharmD 14:45 06/29/17 ([email or web address])    Streptococcus pyogenes NOT DETECTED NOT DETECTED Final   Acinetobacter baumannii NOT DETECTED NOT DETECTED Final   Enterobacteriaceae species NOT DETECTED NOT DETECTED Final   Enterobacter cloacae complex NOT DETECTED NOT DETECTED Final   Escherichia coli NOT DETECTED NOT DETECTED Final   Klebsiella oxytoca NOT DETECTED NOT DETECTED Final   Klebsiella pneumoniae NOT DETECTED NOT DETECTED Final   Proteus species NOT DETECTED NOT DETECTED Final   Serratia marcescens NOT DETECTED NOT DETECTED Final   Haemophilus influenzae NOT DETECTED NOT DETECTED Final   Neisseria meningitidis NOT DETECTED NOT DETECTED Final   Pseudomonas aeruginosa NOT DETECTED NOT DETECTED Final   Candida albicans NOT DETECTED NOT DETECTED Final   Candida glabrata NOT DETECTED NOT DETECTED Final   Candida krusei NOT DETECTED NOT DETECTED Final   Candida parapsilosis NOT DETECTED NOT DETECTED Final   Candida tropicalis NOT DETECTED NOT DETECTED Final    Comment: Performed at [facility], [place] [address], [place],  [address]         Radiology Studies: Mr Virgel Paling WP Contrast  Result Date: 06/30/2017 CLINICAL DATA:  Stroke EXAM: MRA HEAD WITHOUT CONTRAST TECHNIQUE: Angiographic images of the Circle of Willis were obtained using MRA technique without intravenous contrast. COMPARISON:  MRI head 06/28/2017 FINDINGS: Severe stenosis distal right vertebral artery, better seen on the MRA neck  from today. Left vertebral artery patent to the basilar. Moderate  stenosis origin of right AICA which contributes to the right PICA. Bilateral PICA patent. Basilar patent. Superior cerebellar and posterior cerebral arteries patent bilaterally. Mild to moderate stenosis in the posterior cerebral artery bilaterally. Internal carotid artery widely patent bilaterally. Occlusion of the proximal right anterior cerebral artery Left middle cerebral artery patent with mild irregularity distally. Anterior cerebral arteries patent bilaterally. Negative for cerebral aneurysm. IMPRESSION: Occlusion of the right M1 segment. Severe stenosis distal right vertebral artery. Mild to moderate stenosis in the posterior cerebral artery bilaterally. Electronically Signed   By: Franchot Gallo M.D.   On: 06/30/2017 12:51   Mr Jodene Nam Neck W Wo Contrast  Result Date: 06/30/2017 CLINICAL DATA:  Stroke EXAM: MRA NECK WITHOUT AND WITH CONTRAST TECHNIQUE: Multiplanar and multiecho pulse sequences of the neck were obtained without and with intravenous contrast. Angiographic images of the neck were obtained using MRA technique without and with intravenous contrast. CONTRAST:  4mL MULTIHANCE GADOBENATE DIMEGLUMINE 529 MG/ML IV SOLN COMPARISON:  MRA head from today FINDINGS: Antegrade flow in carotid and vertebral arteries bilaterally. Left vertebral artery dominant and widely patent Small right vertebral artery with moderate to severe stenosis proximally and distally. This appears to have a small contribution to the basilar Carotid bifurcation widely patent bilaterally without carotid stenosis. IMPRESSION: Negative for carotid stenosis in the neck Dominant left vertebral artery widely patent. Small right vertebral artery with moderate to severe stenosis proximally and distally. Electronically Signed   By: Franchot Gallo M.D.   On: 06/30/2017 12:53   Mr Brain W Contrast  Result Date: 06/30/2017 CLINICAL DATA:  Stroke EXAM: MRI HEAD WITH CONTRAST  TECHNIQUE: Multiplanar, multiecho pulse sequences of the brain and surrounding structures were obtained with intravenous contrast. CONTRAST:  64mL MULTIHANCE GADOBENATE DIMEGLUMINE 529 MG/ML IV SOLN COMPARISON:  06/28/2017 FINDINGS: Postcontrast imaging performed. Diffusion-weighted imaging not repeated. Small acute infarct left cerebellum do not enhance. No enhancing mass lesion. Generalized atrophy. Chronic infarcts in the corona radiata bilaterally. IMPRESSION: Normal enhancement postcontrast infusion. Small acute infarcts left cerebellum do not enhance. Electronically Signed   By: Franchot Gallo M.D.   On: 06/30/2017 12:55        Scheduled Meds: . aspirin EC  81 mg Oral Daily  . atorvastatin  40 mg Oral q1800  . heparin  5,000 Units Subcutaneous Q8H  . senna  1 tablet Oral BID   Continuous Infusions: . sodium chloride Stopped (06/30/17 1000)  . cefTRIAXone (ROCEPHIN)  IV Stopped (07/01/17 0420)     LOS: 3 days    Time spent: 4 minutes    Irine Seal, MD [facility] Pager [phone]  If 7PM-7AM, please contact night-coverage [email or web address] Password [facility] 07/01/2017, 9:49 AM

## 2017-07-01 NOTE — Consult Note (Signed)
[facility] and [facility]in Specialist of [facility]  Patient name: Thomas [name] MRN: [id number] DOB: [birth year] Sex: male   REQUESTING PROVIDER:    Dr. Grandville Silos   REASON FOR CONSULT:    Possible ruptured abdominal aortic aneurysm  HISTORY OF PRESENT ILLNESS:   Thomas [name] is a 62 y.o. male, who presented to the emergency department on 06/28/2017 with sudden onset of fevers chills, diaphoresis, nonproductive cough.  His influenza test was negative.  His initial EKG showed right bundle branch block with ST elevation in the anterior leads.  He was felt to have possible acute pericarditis.  He also had an MRI of the brain that revealed 3 acute areas of restricted diffusion in the left cerebellum.  The patient states that his abdominal pain became more severe earlier today.  A CT scan was performed which shows a 6.6 cm infrarenal abdominal aortic aneurysm with periaortic stranding and a large left retroperitoneal hematoma.  PAST MEDICAL HISTORY    Past Medical History:  Diagnosis Date  . Hypertension      FAMILY HISTORY   History reviewed. No pertinent family history.  SOCIAL HISTORY:   Social History   Socioeconomic History  . Marital status: Divorced    Spouse name: Not on file  . Number of children: Not on file  . Years of education: Not on file  . Highest education level: Not on file  Occupational History  . Not on file  Social Needs  . Financial resource strain: Not on file  . Food insecurity:    Worry: Not on file    Inability: Not on file  . Transportation needs:    Medical: Not on file    Non-medical: Not on file  Tobacco Use  . Smoking status: Current Every Day Smoker    Types: Cigarettes  . Smokeless tobacco: Never Used  Substance and Sexual Activity  . Alcohol use: No  . Drug use: No  . Sexual activity: Not on file  Lifestyle  . Physical activity:    Days per week: Not on file    Minutes per session: Not on file  . Stress: Not  on file  Relationships  . Social connections:    Talks on phone: Not on file    Gets together: Not on file    Attends religious service: Not on file    Active member of club or organization: Not on file    Attends meetings of clubs or organizations: Not on file    Relationship status: Not on file  . Intimate partner violence:    Fear of current or ex partner: Not on file    Emotionally abused: Not on file    Physically abused: Not on file    Forced sexual activity: Not on file  Other Topics Concern  . Not on file  Social History Narrative  . Not on file    ALLERGIES:    No Known Allergies  CURRENT MEDICATIONS:    Current Facility-Administered Medications  Medication Dose Route Frequency Provider Last Rate Last Dose  . 0.9 %  sodium chloride infusion   Intravenous Continuous Eugenie Filler, MD   Stopped at 06/30/17 1000  . 0.9 %  sodium chloride infusion   Intravenous Once Eugenie Filler, MD      . acetaminophen (TYLENOL) tablet 650 mg  650 mg Oral Q6H PRN Patrecia Pour, Christean Grief, MD       Or  . acetaminophen (TYLENOL) suppository 650 mg  650 mg Rectal Q6H  PRN Patrecia Pour, Christean Grief, MD      . acetaminophen (TYLENOL) tablet 650 mg  650 mg Oral Q4H PRN Patrecia Pour, Christean Grief, MD   650 mg at 07/01/17 0520  . acetaminophen (TYLENOL) tablet 650 mg  650 mg Oral Once Eugenie Filler, MD      . aspirin EC tablet 81 mg  81 mg Oral Daily Patrecia Pour, Christean Grief, MD   81 mg at 07/01/17 0855  . atorvastatin (LIPITOR) tablet 40 mg  40 mg Oral q1800 Patrecia Pour, Christean Grief, MD   40 mg at 06/30/17 1717  . cefTRIAXone (ROCEPHIN) 2 g in sodium chloride 0.9 % 100 mL IVPB  2 g Intravenous Q12H Campbell Riches, MD 200 mL/hr at 07/01/17 1544 2 g at 07/01/17 1544  . diphenhydrAMINE (BENADRYL) capsule 25 mg  25 mg Oral Once Eugenie Filler, MD      . furosemide (LASIX) injection 20 mg  20 mg Intravenous Once Eugenie Filler, MD      . heparin injection 5,000 Units  5,000 Units Subcutaneous Q8H  Patrecia Pour, Christean Grief, MD   5,000 Units at 07/01/17 1334  . iopamidol (ISOVUE-300) 61 % injection 100 mL  100 mL Intravenous Once PRN Milly Jakob, MD      . iopamidol (ISOVUE-300) 61 % injection           . iopamidol (ISOVUE-300) 61 % injection           . magnesium citrate solution 1 Bottle  1 Bottle Oral Once PRN Patrecia Pour, Christean Grief, MD      . meclizine (ANTIVERT) tablet 25 mg  25 mg Oral TID PRN Patrecia Pour, Christean Grief, MD      . morphine 4 MG/ML injection 4 mg  4 mg Intravenous Q4H PRN Eugenie Filler, MD   4 mg at 07/01/17 1544  . nitroGLYCERIN (NITROSTAT) SL tablet 0.4 mg  0.4 mg Sublingual Q5 Min x 3 PRN Patrecia Pour, Christean Grief, MD      . norepinephrine (LEVOPHED) 4 mg in dextrose 5 % 250 mL (0.016 mg/mL) infusion  0-40 mcg/min Intravenous Titrated Eugenie Filler, MD      . ondansetron [facility]) injection 4 mg  4 mg Intravenous Q6H PRN Patrecia Pour, Christean Grief, MD      . oxyCODONE (Oxy IR/ROXICODONE) immediate release tablet 5 mg  5 mg Oral Q4H PRN Patrecia Pour, Christean Grief, MD   5 mg at 07/01/17 1544  . polyethylene glycol (MIRALAX / GLYCOLAX) packet 17 g  17 g Oral Daily PRN Patrecia Pour, Christean Grief, MD      . senna [facility]) tablet 8.6 mg  1 tablet Oral BID Patrecia Pour, Christean Grief, MD   8.6 mg at 07/01/17 0855  . sodium chloride 0.9 % bolus 1,000 mL  1,000 mL Intravenous Once Eugenie Filler, MD      . sorbitol 70 % solution 30 mL  30 mL Oral Daily PRN Patrecia Pour, Christean Grief, MD      . zolpidem (AMBIEN) tablet 5 mg  5 mg Oral QHS PRN,MR X 1 Patrecia Pour, Christean Grief, MD        REVIEW OF SYSTEMS:   [X]  denotes positive finding, [ ]  denotes negative finding Cardiac  Comments:  Chest pain or chest pressure: x   Shortness of breath upon exertion:    Short of breath when lying flat:    Irregular heart rhythm:        Vascular    Pain in calf, thigh, or hip brought on by  ambulation:    Pain in feet at night that wakes you up from your sleep:     Blood clot in your veins:    Leg swelling:         Pulmonary     Oxygen at home:    Productive cough:     Wheezing:         Neurologic    Sudden weakness in arms or legs:     Sudden numbness in arms or legs:     Sudden onset of difficulty speaking or slurred speech:    Temporary loss of vision in one eye:     Problems with dizziness:         Gastrointestinal    Blood in stool:      Vomited blood:         Genitourinary    Burning when urinating:     Blood in urine:        Psychiatric    Major depression:         Hematologic    Bleeding problems:    Problems with blood clotting too easily:        Skin    Rashes or ulcers:        Constitutional    Fever or chills:     PHYSICAL EXAM:   Vitals:   07/01/17 1635 07/01/17 1640 07/01/17 1650 07/01/17 1700  BP: 96/75 (!) 89/67 95/68 91/78   Pulse:      Resp:      Temp:      TempSrc:      SpO2:      Weight:      Height:        GENERAL: The patient is a well-nourished male, in no acute distress. The vital signs are documented above. CARDIAC: There is a regular rate and rhythm.  VASCULAR: Palpable bilateral femoral pulses and left pedal pulse. PULMONARY: Nonlabored respirations ABDOMEN: Diffusely tender.  MUSCULOSKELETAL: There are no major deformities or cyanosis. NEUROLOGIC: No focal weakness or paresthesias are detected. SKIN: There are no ulcers or rashes noted. PSYCHIATRIC: The patient has a normal affect.  STUDIES:   I have reviewed his CT scan with the following findings: 1. Fusiform infrarenal abdominal aortic aneurysm measures up to 6.6 cm and maximum diameter. There is Peri aneurysm all edema/hemorrhage with a large left-sided retroperitoneal hematoma lateral to the left psoas muscle in tracking down into the left pelvic sidewall. Imaging features are compatible with aortic aneurysm rupture. 2. Probable infarct posterior spleen with small subcapsular hematoma. 3. No substantial intraperitoneal free fluid. 4. Small low-density renal lesions bilaterally, likely  cysts. 5. 13 mm left adrenal nodule cannot be further characterized on this study.  ASSESSMENT and PLAN   AAA I have reviewed his CT scan with multiple radiologist.:  He has a large infrarenal abdominal aortic aneurysm as well as a large left-sided retroperitoneal hematoma.  The most likely explanation for this would be a ruptured aneurysm however for the most part there are good tissue planes between the 2 indicating that this may potentially be 2 separate processes.  However upon examining the patient, he is now becoming more unstable.  I discussed with the patient and family that the most appropriate course of action is to go emergently to the operating room for repair of his aneurysm.  Of course if the pain is secondary to a spontaneous retroperitoneal hematoma this would be made worse by the administration of IV heparin for his aneurysm repair however  this should be walled off given that it is in the retroperitoneum and should be able to be treated medically.  I am also concerned that in the setting of acute infective pericarditis with possible bacteremia that the stent graft is at risk for getting infected.  He will need to stay on IV antibiotics for a prolonged period of time to minimize the risk of this.   Annamarie Major, MD [facility] and [facility] Specialists of [facility] [phone] Pager [phone]

## 2017-07-01 NOTE — Consult Note (Addendum)
PULMONARY / CRITICAL CARE MEDICINE   Name: Thomas [name] MRN: [id number] DOB: 1955-09-16    ADMISSION DATE:  06/28/2017 CONSULTATION DATE:  07/01/2017  REFERRING MD:  Dr. Trula Slade   CHIEF COMPLAINT:  Vent Dependent s/p OR   HISTORY OF PRESENT ILLNESS:   62 year old male with PMH of Tobacco Abuse, HTN, and Chronic Kidney Disease Stage 3.   Presents to ED on 4/4 with fever, chills, nonproductive cough, and chest pain. On arrival to ED WBC 24.1. U/A negative. Flu negative. CXR with interstitial lung disease with pleural scarring. Troponin 0.33. EKG with ST elevation in the anterior leads. Cardiology consulted, believes due to acute pericarditis. Started on ASA. Placed on azithromycin and rocephin for probable clinical pneumonia. Admitted to [facility].  Neurology was consulted. MRI Brain with 3 acute areas of restricted diffusion in the left cerebellum as well as evidence of old small bilateral cerebellar infarcts and old basal ganglia/thalamic infarcts.  During stay blood cultures positive for Pnemococcal Bacteremia. Concern for Bacterial endocarditis.   On 4/7 patient with diffuse abdominal pain with drop in BP to systolic 10X with HR 323F. Concern for ruptured aortic aneurysm. CT A/P wth 6.6 cm diameter aortic aneurysm with findings concerning for rupture. Vascular Surgery Consulted. Patient taken to OR for further evaluation. During OR stent was placed, however in PACU patient was noted to have no pulses in bilateral femoral. Taken to back to OR for bilateral Thrombectomy.  PCCM asked to consult.   PAST MEDICAL HISTORY :  He  has a past medical history of Hypertension.  PAST SURGICAL HISTORY: He  has no past surgical history on file.  No Known Allergies  No current facility-administered medications on file prior to encounter.    Current Outpatient Medications on File Prior to Encounter  Medication Sig  . aspirin-sod bicarb-citric acid (ALKA-SELTZER) 325 MG TBEF tablet Take 325 mg by mouth daily  as needed.  . cyclobenzaprine (FLEXERIL) 10 MG tablet Take 1 tablet (10 mg total) by mouth 2 (two) times daily as needed for muscle spasms. (Patient not taking: Reported on 06/28/2017)    FAMILY HISTORY:  His indicated that his mother is deceased. He indicated that his father is deceased.   SOCIAL HISTORY: He  reports that he has been smoking cigarettes.  He has never used smokeless tobacco. He reports that he does not drink alcohol or use drugs.  REVIEW OF SYSTEMS:   Unable to review as patient is intubated and sedated   SUBJECTIVE:   VITAL SIGNS: BP 91/78   Pulse 92   Temp 99 F (37.2 C) (Oral)   Resp 18   Ht 5\' 5"  (1.651 m)   Wt 63.3 kg (139 lb 8.8 oz)   SpO2 100%   BMI 23.22 kg/m   HEMODYNAMICS:    VENTILATOR SETTINGS:    INTAKE / OUTPUT: I/O last 3 completed shifts: In: 1953.3 [I.V.:1653.3; IV Piggyback:300] Out: -   PHYSICAL EXAMINATION: General:  Adult male on vent  Neuro:  Sedated, opens eyes to physical stimulation, withdrawals to pain  HEENT:  ETT in place  Cardiovascular:  Loletha Grayer, no MRG  Lungs:  Crackles to bases, on vent  Abdomen:  Active bowel sounds  Musculoskeletal:  -edema Skin:  Warm, dry, surgical incision to bilateral groin  LABS:  BMET Recent Labs  Lab 06/30/17 0350 07/01/17 0654 07/01/17 1632  NA 132* 135 132*  K 3.7 3.3* 4.0  CL 105 106 105  CO2 15* 19* 16*  BUN 19 20 23*  CREATININE 1.30* 1.38* 1.47*  GLUCOSE 102* 97 117*    Electrolytes Recent Labs  Lab 06/29/17 0320 06/30/17 0350 07/01/17 0654 07/01/17 1632  CALCIUM 8.2* 7.9* 8.3* 7.9*  MG 2.5* 1.9 2.0  --     CBC Recent Labs  Lab 06/30/17 0350 07/01/17 0654 07/01/17 1632  WBC 18.7* 19.4* 22.8*  HGB 12.9* 12.9* 12.4*  HCT 37.9* 37.7* 36.4*  PLT 127* 138* 155    Coag's Recent Labs  Lab 06/29/17 0320 07/01/17 1632  APTT 47*  --   INR 1.23 1.04    Sepsis Markers Recent Labs  Lab 06/29/17 2128  PROCALCITON 39.90    ABG No results for input(s):  PHART, PCO2ART, PO2ART in the last 168 hours.  Liver Enzymes Recent Labs  Lab 07/01/17 1632  AST 40  ALT 27  ALKPHOS 81  BILITOT 1.2  ALBUMIN 2.4*    Cardiac Enzymes Recent Labs  Lab 06/28/17 2120 06/29/17 0320 06/29/17 0904  TROPONINI 0.45* 0.41* 0.34*    Glucose Recent Labs  Lab 06/28/17 1625 06/29/17 0813 06/30/17 0731 07/01/17 0632  GLUCAP 115* 90 99 95    Imaging Ct Abdomen Pelvis W Contrast  Result Date: 07/01/2017 CLINICAL DATA:  Left lower quadrant pain. EXAM: CT ABDOMEN AND PELVIS WITH CONTRAST TECHNIQUE: Multidetector CT imaging of the abdomen and pelvis was performed using the standard protocol following bolus administration of intravenous contrast. CONTRAST:  41mL ISOVUE-300 IOPAMIDOL (ISOVUE-300) INJECTION 61% COMPARISON:  None. FINDINGS: Lower chest: Chronic interstitial changes noted in the peripheral lung bases bilaterally. Hepatobiliary: No focal abnormality within the liver parenchyma. There is no evidence for gallstones, gallbladder wall thickening, or pericholecystic fluid. No intrahepatic or extrahepatic biliary dilation. Pancreas: No focal mass lesion. No dilatation of the main duct. No intraparenchymal cyst. No peripancreatic edema. Spleen: Small subcapsular fluid collection in the spleen suggests a hematoma. Small defect in the subcapsular aspect of the posterior spleen (image 27/3) is compatible with a small infarct. Adrenals/Urinary Tract: Right adrenal gland unremarkable. 13 mm left adrenal nodule cannot be definitively characterized. Small hypodense lesions in each kidney are compatible with cysts. Cortical scarring noted in the upper pole of the right kidney. No evidence for hydroureter. Small bilateral bladder diverticuli suggests underlying component of bladder outlet obstruction. Stomach/Bowel: Stomach is nondistended. No gastric wall thickening. No evidence of outlet obstruction. Duodenum is normally positioned as is the ligament of Treitz. No small  bowel wall thickening. No small bowel dilatation. The terminal ileum is normal. Vascular/Lymphatic: 6.0 x 6.6 cm infrarenal fusiform abdominal aortic aneurysm identified with substantial mural thrombus. There is periaortic edema/hemorrhage tracking to the left with a relatively large hematoma lateral to the left psoas muscle measuring 16.3 x 5.6 x 8.9 cm. Aneurysm extends into the bifurcation with prominent atherosclerosis in the origin of each common iliac artery. There is no gastrohepatic or hepatoduodenal ligament lymphadenopathy. No intraperitoneal or retroperitoneal lymphadenopathy. No pelvic sidewall lymphadenopathy. Reproductive: The prostate gland and seminal vesicles have normal imaging features. Other: No intraperitoneal free fluid. Musculoskeletal: Bone windows reveal no worrisome lytic or sclerotic osseous lesions. Gas in the subcutaneous fat of the anterior abdominal wall bilaterally is presumably secondary to injections. IMPRESSION: 1. Fusiform infrarenal abdominal aortic aneurysm measures up to 6.6 cm and maximum diameter. There is Peri aneurysm all edema/hemorrhage with a large left-sided retroperitoneal hematoma lateral to the left psoas muscle in tracking down into the left pelvic sidewall. Imaging features are compatible with aortic aneurysm rupture. 2. Probable infarct posterior spleen with small subcapsular hematoma.  3. No substantial intraperitoneal free fluid. 4. Small low-density renal lesions bilaterally, likely cysts. 5. 13 mm left adrenal nodule cannot be further characterized on this study. Critical Value/emergent results were called by me at the time of interpretation on 07/01/2017 at 3:57 pm to Dr. Irine Seal , who verbally acknowledged these results. Electronically Signed   By: Misty Stanley M.D.   On: 07/01/2017 16:01     STUDIES:  CXR 4/4 > 1. Interstitial lung disease possibly chronic. A component of basilar bronchiectasis most likely present in the lung bases. Pleural  scarring. Tortuous thoracic aorta. Heart size normal. No pulmonary venous Congestion. MR Brain 4/4 > Three acute nonhemorrhagic small LEFT cerebellar infarcts. Old small bilateral cerebellar infarcts. Old basal ganglia and thalamus infarcts. MRA Head 4/6 > Occlusion of the right M1 segment. Severe stenosis distal right vertebral artery. Mild to moderate stenosis in the posterior cerebral artery bilaterally. MR Brain W/C 4/6 > Normal enhancement postcontrast infusion. Small acute infarcts left cerebellum do not enhance MR Neck 4/6 >  Negative for carotid stenosis in the neck. Dominant left vertebral artery widely patent. Small right vertebral artery with moderate to severe stenosis proximally and distally CT A/P 4/7 > 1. Fusiform infrarenal abdominal aortic aneurysm measures up to 6.6 cm and maximum diameter. There is Peri aneurysm all edema/hemorrhage with a large left-sided retroperitoneal hematoma lateral to the left psoas muscle in tracking down into the left pelvic sidewall. Imaging features are compatible with aortic aneurysm rupture. 2. Probable infarct posterior spleen with small subcapsular hematoma. 3. No substantial intraperitoneal free fluid. 4. Small low-density renal lesions bilaterally, likely cysts. 5. 13 mm left adrenal nodule cannot be further characterized on this study.  CULTURES: U/A 4/4 > Negative  Blood 4/4 > Streptococcus pneumoniae Blood 4/6 >>  Sputum 4/7 >>   ANTIBIOTICS: Azithromycin 4/4  Rocephin 4/4 >>  Ancef 4/7   SIGNIFICANT EVENTS: 4/4 > Presents to ED  4/7 > Taken to OR   LINES/TUBES: RIJ CVC 4/7 >> ETT 4/7 >>   DISCUSSION: 62 year old male presents to ED on 4/4 with fever, chills, nonproductive cough, and chest pain. During stay blood cultures positive for Pnemococcal Bacteremia. Concern for Bacterial endocarditis. On 4/7 patient with diffuse abdominal pain with drop in BP to systolic 10F with HR 751W. Concern for ruptured aortic aneurysm.  Vascular Surgery Consulted. Patient taken to OR for further evaluation. PCCM asked to consult.    ASSESSMENT / PLAN:  PULMONARY A: Respiratory Insufficieny in post-operative state - Per Anesthesia Difficult Intubation  H/O Tobacco Use  P:   Vent Support > Plans to Extubate in AM (However Cardiology with plans for TEE, touch base with them before extubation)  Trend CXR/ABG  Pulmonary Hygiene  VAP Bundle   CARDIOVASCULAR A:  Hemorrhagic Shock in setting of Ruptured Aortic Aneurysm s/p stent placement  S/P Bilateral Thrombectomy  Pericarditis  Concern for Bacterial Endocarditis  H/O HTN, HLD  P:  Cardiology Following Cardiac Monitoring  Wean Neo to achieve MAP > 65 ECHO with no Vegetation > Plans for TEE   RENAL A:   Chronic Kidney Disease Stage 3  P:   Trend BMP LA pending  Replace electrolytes as indicated Avoid Nephrotoxic medications   GASTROINTESTINAL A:   SUP  P:   NPO PPI  HEMATOLOGIC A:   Acute Blood Anemia in setting of presumed ruptured Aortic Aneurysm > Vascular Surgeon says RP Hematoma  P:  Trend CBC q6h (STAT ordered now)  Hold all anticoagulation  INFECTIOUS A:   Pnemococcal Bacteremia concerning for CNS involvement  P:   ID following > Rocephin  Trend WBC and Fever Curve  Follow Repeat Culture Data  Plans for TEE as above   ENDOCRINE A:   No issues    P:   Trend Glucose  NEUROLOGIC A:   Sedation Needs  Left Cerebellar Infarcts (Believed due to Septic Emboli)  MRA with Chronic Right M1 Occlusion, Bilateral PCA Stenosis and Chronic Abdominal Right Vertebral Artery Stenosis  MRI with old bilateral basal ganglia lacunar infarcts  P:   RASS goal: 0/-1 Neurology Following  Wean Precedex/Fentanyl gtt to achieve RASS PRN Versed     FAMILY  - Updates: Daughter updated at bedside.   - Inter-disciplinary family meet or Palliative Care meeting due by: 07/08/2017     Hayden Pedro, AGACNP-BC [facility]   Pgr: [phone]  PCCM Pgr: [phone]

## 2017-07-01 NOTE — Anesthesia Procedure Notes (Signed)
Arterial Line Insertion Start/End4/09/2017 5:45 PM, 07/01/2017 5:50 PM Performed by: CRNA  Patient location: Pre-op. Preanesthetic checklist: patient identified, IV checked, site marked, risks and benefits discussed, surgical consent, monitors and equipment checked, pre-op evaluation, timeout performed and anesthesia consent Lidocaine 1% used for infiltration Left, radial was placed Catheter size: 20 Fr Hand hygiene performed  and maximum sterile barriers used   Attempts: 1 Procedure performed without using ultrasound guided technique. Following insertion, dressing applied. Post procedure assessment: normal and unchanged

## 2017-07-01 NOTE — Anesthesia Preprocedure Evaluation (Addendum)
Anesthesia Evaluation  Patient identified by MRN, date of birth, ID band Patient awake    Reviewed: Allergy & Precautions, NPO status , Patient's Chart, lab work & pertinent test results  History of Anesthesia Complications Negative for: history of anesthetic complications  Airway Mallampati: II  TM Distance: >3 FB Neck ROM: Full    Dental  (+) Poor Dentition, Dental Advisory Given   Pulmonary Current Smoker,    breath sounds clear to auscultation       Cardiovascular hypertension, Pt. on medications + Peripheral Vascular Disease  negative cardio ROS Normal cardiovascular exam  Study Conclusions  - Left ventricle: The cavity size was normal. There was moderate   focal basal hypertrophy. Systolic function was normal. The   estimated ejection fraction was in the range of 60% to 65%. Wall   motion was normal; there were no regional wall motion   abnormalities. The study is not technically sufficient to allow   evaluation of LV diastolic function. - Aortic valve: Trileaflet; mildly thickened, mildly calcified   leaflets. There was mild regurgitation.    Neuro/Psych CVA negative neurological ROS  negative psych ROS   GI/Hepatic negative GI ROS, Neg liver ROS,   Endo/Other  negative endocrine ROS  Renal/GU Renal InsufficiencyRenal disease  negative genitourinary   Musculoskeletal negative musculoskeletal ROS (+)   Abdominal   Peds negative pediatric ROS (+)  Hematology negative hematology ROS (+)   Anesthesia Other Findings   Reproductive/Obstetrics negative OB ROS                            Anesthesia Physical Anesthesia Plan  ASA: IV and emergent  Anesthesia Plan: General   Post-op Pain Management:    Induction: Intravenous  PONV Risk Score and Plan: 2 and Ondansetron and Dexamethasone  Airway Management Planned: Oral ETT  Additional Equipment: Arterial line, CVP and  Ultrasound Guidance Line Placement  Intra-op Plan:   Post-operative Plan: Possible Post-op intubation/ventilation  Informed Consent: I have reviewed the patients History and Physical, chart, labs and discussed the procedure including the risks, benefits and alternatives for the proposed anesthesia with the patient or authorized representative who has indicated his/her understanding and acceptance.   Dental advisory given  Plan Discussed with: CRNA, Anesthesiologist and Surgeon  Anesthesia Plan Comments:       Anesthesia Quick Evaluation

## 2017-07-01 NOTE — Anesthesia Procedure Notes (Addendum)
Procedure Name: Intubation Date/Time: 07/01/2017 7:13 PM Performed by: Suzy Bouchard, CRNA Pre-anesthesia Checklist: Patient identified, Emergency Drugs available, Suction available, Patient being monitored and Timeout performed Patient Re-evaluated:Patient Re-evaluated prior to induction Oxygen Delivery Method: Circle system utilized Preoxygenation: Pre-oxygenation with 100% oxygen Induction Type: IV induction Ventilation: Mask ventilation without difficulty and Oral airway inserted - appropriate to patient size Laryngoscope Size: Miller, 2, Glidescope and 3 Grade View: Grade III Tube type: Subglottic suction tube Tube size: 7.5 mm Number of attempts: 4 Airway Equipment and Method: Stylet and Video-laryngoscopy Placement Confirmation: ETT inserted through vocal cords under direct vision,  positive ETCO2 and breath sounds checked- equal and bilateral Secured at: 22 cm Tube secured with: Tape Dental Injury: Teeth and Oropharynx as per pre-operative assessment  Difficulty Due To: Difficult Airway- due to dentition and Difficult Airway- due to anterior larynx Future Recommendations: Recommend- induction with short-acting agent, and alternative techniques readily available Comments: After decision made to induce general anesthesia for procedure. Patient pre-oxygenated and Dr. Tobias Alexander at bedside.  IV Induction, DL X1 with Miller 2.  Unable to see airway anatomy other than epiglottis. Dr Tobias Alexander DL with same blade, same view. Blind attempt to esophagus, immediately recognized. Able to mask ventilate with oral airway. DL x2 attempts with glidescope resulted in successful intubation, with suboptimal gr 2-3 view.  Blood tinged saliva in oropharynx but no frank blood.  +BBS +ETC02.  OG inserted and stomach decompressed after tube secured.

## 2017-07-02 ENCOUNTER — Inpatient Hospital Stay (HOSPITAL_COMMUNITY): Payer: Private Health Insurance - Indemnity

## 2017-07-02 DIAGNOSIS — I718 Aortic aneurysm of unspecified site, ruptured: Secondary | ICD-10-CM

## 2017-07-02 DIAGNOSIS — R6521 Severe sepsis with septic shock: Secondary | ICD-10-CM

## 2017-07-02 DIAGNOSIS — I639 Cerebral infarction, unspecified: Secondary | ICD-10-CM

## 2017-07-02 DIAGNOSIS — Z9911 Dependence on respirator [ventilator] status: Secondary | ICD-10-CM

## 2017-07-02 DIAGNOSIS — D62 Acute posthemorrhagic anemia: Secondary | ICD-10-CM | POA: Diagnosis not present

## 2017-07-02 DIAGNOSIS — I6340 Cerebral infarction due to embolism of unspecified cerebral artery: Secondary | ICD-10-CM

## 2017-07-02 DIAGNOSIS — I330 Acute and subacute infective endocarditis: Secondary | ICD-10-CM

## 2017-07-02 DIAGNOSIS — E875 Hyperkalemia: Secondary | ICD-10-CM

## 2017-07-02 DIAGNOSIS — N179 Acute kidney failure, unspecified: Secondary | ICD-10-CM

## 2017-07-02 DIAGNOSIS — I010 Acute rheumatic pericarditis: Secondary | ICD-10-CM

## 2017-07-02 DIAGNOSIS — R58 Hemorrhage, not elsewhere classified: Secondary | ICD-10-CM

## 2017-07-02 DIAGNOSIS — Z9889 Other specified postprocedural states: Secondary | ICD-10-CM

## 2017-07-02 DIAGNOSIS — A419 Sepsis, unspecified organism: Secondary | ICD-10-CM

## 2017-07-02 DIAGNOSIS — I713 Abdominal aortic aneurysm, ruptured: Principal | ICD-10-CM

## 2017-07-02 DIAGNOSIS — Z8679 Personal history of other diseases of the circulatory system: Secondary | ICD-10-CM

## 2017-07-02 DIAGNOSIS — J189 Pneumonia, unspecified organism: Secondary | ICD-10-CM

## 2017-07-02 DIAGNOSIS — R578 Other shock: Secondary | ICD-10-CM

## 2017-07-02 LAB — GLUCOSE, CAPILLARY
GLUCOSE-CAPILLARY: 125 mg/dL — AB (ref 65–99)
GLUCOSE-CAPILLARY: 129 mg/dL — AB (ref 65–99)
GLUCOSE-CAPILLARY: 132 mg/dL — AB (ref 65–99)
Glucose-Capillary: 117 mg/dL — ABNORMAL HIGH (ref 65–99)
Glucose-Capillary: 130 mg/dL — ABNORMAL HIGH (ref 65–99)
Glucose-Capillary: 131 mg/dL — ABNORMAL HIGH (ref 65–99)

## 2017-07-02 LAB — POCT I-STAT 3, ART BLOOD GAS (G3+)
Acid-base deficit: 11 mmol/L — ABNORMAL HIGH (ref 0.0–2.0)
BICARBONATE: 16.4 mmol/L — AB (ref 20.0–28.0)
O2 Saturation: 99 %
PCO2 ART: 39.9 mmHg (ref 32.0–48.0)
PH ART: 7.219 — AB (ref 7.350–7.450)
Patient temperature: 97.5
TCO2: 18 mmol/L — AB (ref 22–32)
pO2, Arterial: 154 mmHg — ABNORMAL HIGH (ref 83.0–108.0)

## 2017-07-02 LAB — BLOOD GAS, ARTERIAL
ACID-BASE DEFICIT: 5.2 mmol/L — AB (ref 0.0–2.0)
Bicarbonate: 20.5 mmol/L (ref 20.0–28.0)
DRAWN BY: 517021
FIO2: 60
MECHVT: 500 mL
O2 SAT: 98.8 %
PEEP/CPAP: 5 cmH2O
PH ART: 7.276 — AB (ref 7.350–7.450)
PO2 ART: 187 mmHg — AB (ref 83.0–108.0)
Patient temperature: 98.6
RATE: 18 resp/min
pCO2 arterial: 45.5 mmHg (ref 32.0–48.0)

## 2017-07-02 LAB — CBC
HCT: 29.3 % — ABNORMAL LOW (ref 39.0–52.0)
HCT: 27.4 % — ABNORMAL LOW (ref 39.0–52.0)
HCT: 25.8 % — ABNORMAL LOW (ref 39.0–52.0)
HEMOGLOBIN: 8.6 g/dL — AB (ref 13.0–17.0)
Hemoglobin: 9.8 g/dL — ABNORMAL LOW (ref 13.0–17.0)
Hemoglobin: 9.1 g/dL — ABNORMAL LOW (ref 13.0–17.0)
MCH: 25 pg — ABNORMAL LOW (ref 26.0–34.0)
MCH: 25.1 pg — ABNORMAL LOW (ref 26.0–34.0)
MCH: 25.3 pg — AB (ref 26.0–34.0)
MCHC: 33.2 g/dL (ref 30.0–36.0)
MCHC: 33.3 g/dL (ref 30.0–36.0)
MCHC: 33.4 g/dL (ref 30.0–36.0)
MCV: 75.5 fL — ABNORMAL LOW (ref 78.0–100.0)
MCV: 75.5 fL — ABNORMAL LOW (ref 78.0–100.0)
MCV: 75 fL — ABNORMAL LOW (ref 78.0–100.0)
PLATELETS: 147 10*3/uL — AB (ref 150–400)
Platelets: 123 10*3/uL — ABNORMAL LOW (ref 150–400)
Platelets: 147 10*3/uL — ABNORMAL LOW (ref 150–400)
RBC: 3.44 MIL/uL — AB (ref 4.22–5.81)
RBC: 3.63 MIL/uL — AB (ref 4.22–5.81)
RBC: 3.88 MIL/uL — ABNORMAL LOW (ref 4.22–5.81)
RDW: 14.3 % (ref 11.5–15.5)
RDW: 14.3 % (ref 11.5–15.5)
RDW: 14.4 % (ref 11.5–15.5)
WBC: 16.4 10*3/uL — AB (ref 4.0–10.5)
WBC: 17.4 10*3/uL — ABNORMAL HIGH (ref 4.0–10.5)
WBC: 19.1 10*3/uL — ABNORMAL HIGH (ref 4.0–10.5)

## 2017-07-02 LAB — LACTIC ACID, PLASMA
Lactic Acid, Venous: 1.4 mmol/L (ref 0.5–1.9)
Lactic Acid, Venous: 1.1 mmol/L (ref 0.5–1.9)
Lactic Acid, Venous: 1 mmol/L (ref 0.5–1.9)

## 2017-07-02 LAB — POCT I-STAT 7, (LYTES, BLD GAS, ICA,H+H)
Acid-base deficit: 8 mmol/L — ABNORMAL HIGH (ref 0.0–2.0)
Bicarbonate: 15.7 mmol/L — ABNORMAL LOW (ref 20.0–28.0)
Calcium, Ion: 1.12 mmol/L — ABNORMAL LOW (ref 1.15–1.40)
HCT: 34 % — ABNORMAL LOW (ref 39.0–52.0)
HEMOGLOBIN: 11.6 g/dL — AB (ref 13.0–17.0)
O2 Saturation: 95 %
PO2 ART: 71 mmHg — AB (ref 83.0–108.0)
Potassium: 4.1 mmol/L (ref 3.5–5.1)
Sodium: 139 mmol/L (ref 135–145)
TCO2: 17 mmol/L — ABNORMAL LOW (ref 22–32)
pCO2 arterial: 26.3 mmHg — ABNORMAL LOW (ref 32.0–48.0)
pH, Arterial: 7.379 (ref 7.350–7.450)

## 2017-07-02 LAB — CK
CK TOTAL: 366 U/L (ref 49–397)
CK TOTAL: 2225 U/L — AB (ref 49–397)
CK TOTAL: 4628 U/L — AB (ref 49–397)
Total CK: 5470 U/L — ABNORMAL HIGH (ref 49–397)

## 2017-07-02 LAB — BASIC METABOLIC PANEL
Anion gap: 7 (ref 5–15)
BUN: 22 mg/dL — AB (ref 6–20)
CHLORIDE: 109 mmol/L (ref 101–111)
CO2: 17 mmol/L — AB (ref 22–32)
Calcium: 7.1 mg/dL — ABNORMAL LOW (ref 8.9–10.3)
Creatinine, Ser: 1.41 mg/dL — ABNORMAL HIGH (ref 0.61–1.24)
GFR calc Af Amer: 60 mL/min — ABNORMAL LOW (ref >60)
GFR, EST NON AFRICAN AMERICAN: 52 mL/min — AB (ref >60)
GLUCOSE: 141 mg/dL — AB (ref 65–99)
POTASSIUM: 5.4 mmol/L — AB (ref 3.5–5.1)
Sodium: 133 mmol/L — ABNORMAL LOW (ref 135–145)

## 2017-07-02 LAB — MRSA PCR SCREENING: MRSA by PCR: NEGATIVE

## 2017-07-02 LAB — PHOSPHORUS: PHOSPHORUS: 4.1 mg/dL (ref 2.5–4.6)

## 2017-07-02 LAB — FIBRINOGEN: Fibrinogen: 592 mg/dL — ABNORMAL HIGH (ref 210–475)

## 2017-07-02 LAB — CORTISOL: Cortisol, Plasma: 4.7 ug/dL

## 2017-07-02 LAB — PROCALCITONIN: PROCALCITONIN: 30.58 ng/mL

## 2017-07-02 LAB — MAGNESIUM: MAGNESIUM: 1.9 mg/dL (ref 1.7–2.4)

## 2017-07-02 MED ORDER — ACETAMINOPHEN 650 MG RE SUPP: 650.0000 mg | Freq: Four times a day (QID) | RECTAL | Status: DC | PRN

## 2017-07-02 MED ORDER — ORAL CARE MOUTH RINSE
15.0000 mL | OROMUCOSAL | Status: DC
  Administered 2017-07-02: 15 mL via OROMUCOSAL

## 2017-07-02 MED ORDER — STERILE WATER FOR INJECTION IV SOLN
INTRAVENOUS | Status: DC
  Administered 2017-07-02 – 2017-07-03 (×4): via INTRAVENOUS
  Filled 2017-07-02 (×6): qty 850

## 2017-07-02 MED ORDER — INSULIN ASPART 100 UNIT/ML ~~LOC~~ SOLN
1.0000 [IU] | Freq: Four times a day (QID) | SUBCUTANEOUS | Status: DC
  Administered 2017-07-02 (×2): 1 [IU] via SUBCUTANEOUS
  Administered 2017-07-03: 2 [IU] via SUBCUTANEOUS
  Administered 2017-07-03 (×2): 1 [IU] via SUBCUTANEOUS
  Administered 2017-07-03 – 2017-07-04 (×4): 2 [IU] via SUBCUTANEOUS
  Administered 2017-07-04: 1 [IU] via SUBCUTANEOUS

## 2017-07-02 MED ORDER — IODIXANOL 320 MG/ML IV SOLN
INTRAVENOUS | Status: DC | PRN
  Administered 2017-07-02: .0001 mL via INTRAVENOUS

## 2017-07-02 MED ORDER — HYDROCORTISONE NA SUCCINATE PF 100 MG IJ SOLR
50.0000 mg | Freq: Four times a day (QID) | INTRAMUSCULAR | Status: DC
  Administered 2017-07-02 – 2017-07-04 (×9): 50 mg via INTRAVENOUS
  Filled 2017-07-02 (×9): qty 2

## 2017-07-02 MED ORDER — ACETAMINOPHEN 160 MG/5ML PO SOLN
650.0000 mg | Freq: Four times a day (QID) | ORAL | Status: DC | PRN
  Administered 2017-07-05: 650 mg
  Filled 2017-07-02: qty 20.3

## 2017-07-02 MED ORDER — CHLORHEXIDINE GLUCONATE 0.12% ORAL RINSE (MEDLINE KIT)
15.0000 mL | Freq: Two times a day (BID) | OROMUCOSAL | Status: DC
  Administered 2017-07-02: 15 mL via OROMUCOSAL

## 2017-07-02 MED ORDER — SIMETHICONE 80 MG PO CHEW
80.0000 mg | CHEWABLE_TABLET | Freq: Four times a day (QID) | ORAL | Status: DC | PRN
  Administered 2017-07-03 – 2017-07-04 (×3): 80 mg via ORAL
  Filled 2017-07-02 (×3): qty 1

## 2017-07-02 MED ORDER — SODIUM BICARBONATE 8.4 % IV SOLN
50.0000 meq | Freq: Once | INTRAVENOUS | Status: AC
  Administered 2017-07-02: 50 meq via INTRAVENOUS
  Filled 2017-07-02: qty 50

## 2017-07-02 NOTE — Progress Notes (Addendum)
  Progress Note    07/02/2017 7:40 AM 1 Day Post-Op  Subjective:  Intubated; restrained   Vitals:   07/02/17 0600 07/02/17 0700  BP: 98/61 99/66  Pulse: (!) 57 61  Resp: (!) 6 (!) 0  Temp:    SpO2: 100% 100%   Physical Exam: Cardiac:  RRR Lungs:  Intubated Incisions:  B groin incisions soft without hematoma or drainage Extremities:  AT/PT by doppler bilaterally; L AT faintly palpable Abdomen:  Soft Neurologic: sedated  CBC    Component Value Date/Time   WBC 16.4 (H) 07/02/2017 0612   RBC 3.63 (L) 07/02/2017 0612   HGB 9.1 (L) 07/02/2017 0612   HCT 27.4 (L) 07/02/2017 0612   PLT 147 (L) 07/02/2017 0612   MCV 75.5 (L) 07/02/2017 0612   MCV 77.8 (A) 06/28/2017 1335   MCH 25.1 (L) 07/02/2017 0612   MCHC 33.2 07/02/2017 0612   RDW 14.4 07/02/2017 0612   LYMPHSABS 1.5 07/01/2017 1632   MONOABS 1.3 (H) 07/01/2017 1632   EOSABS 0.0 07/01/2017 1632   BASOSABS 0.1 07/01/2017 1632    BMET    Component Value Date/Time   NA 133 (L) 07/01/2017 2354   K 5.4 (H) 07/01/2017 2354   CL 109 07/01/2017 2354   CO2 17 (L) 07/01/2017 2354   GLUCOSE 141 (H) 07/01/2017 2354   BUN 22 (H) 07/01/2017 2354   CREATININE 1.41 (H) 07/01/2017 2354   CALCIUM 7.1 (L) 07/01/2017 2354   GFRNONAA 52 (L) 07/01/2017 2354   GFRAA 60 (L) 07/01/2017 2354    INR    Component Value Date/Time   INR 1.04 07/01/2017 1632     Intake/Output Summary (Last 24 hours) at 07/02/2017 0740 Last data filed at 07/02/2017 0600 Gross per 24 hour  Intake 3241.78 ml  Output 705 ml  Net 2536.78 ml     Assessment/Plan:  62 y.o. male is s/p endovascular repair of ruptured AAA, bilateral iliofemoral endarterectomy, BLE thromboembolectomies 1 Day Post-Op   Patent endograft with R AT/PT by doppler and faintly palpable L ATA Incisions intact and unremarkable Check BMP this morning Patient will need to be IV antibiotics for an extended period of time due to high risk of endograft infection in the setting of  bacteremia and possible infective pericarditis  Medical management per primary service; plan is for extubation this morning   Dagoberto Ligas, PA-C [facility] [phone] 07/02/2017 7:40 AM  Agree with the above.  Good pedal doppler signals.  Continue to monitor Hb.    Annamarie Major

## 2017-07-02 NOTE — Progress Notes (Addendum)
PULMONARY / CRITICAL CARE MEDICINE   Name: Thomas [name] MRN: [id number] DOB: 1955/05/15    ADMISSION DATE:  06/28/2017   CHIEF COMPLAINT:  Vent Dependent s/p OR   HISTORY OF PRESENT ILLNESS:   62 year old male with PMH of Tobacco Abuse, HTN, and Chronic Kidney Disease Stage 3.   Presents to ED on 4/4 with fever, chills, nonproductive cough, and chest pain. On arrival to ED WBC 24.1. U/A negative. Flu negative. CXR with interstitial lung disease with pleural scarring. Troponin 0.33. EKG with ST elevation in the anterior leads. Cardiology consulted, believes due to acute pericarditis. Started on ASA. Placed on azithromycin and rocephin for probable clinical pneumonia. Admitted to [facility].  Neurology was consulted. MRI Brain with 3 acute areas of restricted diffusion in the left cerebellum as well as evidence of old small bilateral cerebellar infarcts and old basal ganglia/thalamic infarcts.  During stay blood cultures positive for Pnemococcal Bacteremia. Concern for Bacterial endocarditis.   On 4/7 patient with diffuse abdominal pain with drop in BP to systolic 46N with HR 629B. Concern for ruptured aortic aneurysm. CT A/P wth 6.6 cm diameter aortic aneurysm with findings concerning for rupture. Vascular Surgery Consulted. Patient taken to OR for further evaluation. During OR stent was placed, however in PACU patient was noted to have no pulses in bilateral femoral. Taken to back to OR for bilateral Thrombectomy.     PAST MEDICAL HISTORY :  He  has a past medical history of Hypertension.  PAST SURGICAL HISTORY: He  has no past surgical history on file.  No Known Allergies  No current facility-administered medications on file prior to encounter.    Current Outpatient Medications on File Prior to Encounter  Medication Sig  . aspirin-sod bicarb-citric acid (ALKA-SELTZER) 325 MG TBEF tablet Take 325 mg by mouth daily as needed.  . cyclobenzaprine (FLEXERIL) 10 MG tablet Take 1 tablet (10 mg  total) by mouth 2 (two) times daily as needed for muscle spasms. (Patient not taking: Reported on 06/28/2017)    FAMILY HISTORY:  His indicated that his mother is deceased. He indicated that his father is deceased.   SOCIAL HISTORY: He  reports that he has been smoking cigarettes.  He has never used smokeless tobacco. He reports that he does not drink alcohol or use drugs.  REVIEW OF SYSTEMS:   Patient is awake and able to nod his head to simple questions. Denies chest pain, shortness of breath, abdominal pain.   SUBJECTIVE:   VITAL SIGNS: BP 118/69   Pulse 66   Temp 98.1 F (36.7 C) (Oral)   Resp (!) 7   Ht 5\' 5"  (1.651 m)   Wt 143 lb 4.8 oz (65 kg)   SpO2 100%   BMI 23.85 kg/m   HEMODYNAMICS: CVP:  [12 mmHg-16 mmHg] 16 mmHg  VENTILATOR SETTINGS: Vent Mode: PRVC;SIMV FiO2 (%):  [40 %-60 %] 40 % Set Rate:  [18 bmp-20 bmp] 20 bmp Vt Set:  [500 mL] 500 mL PEEP:  [5 cmH20] 5 cmH20 Pressure Support:  [10 cmH20] 10 cmH20 Plateau Pressure:  [17 cmH20-24 cmH20] 24 cmH20  INTAKE / OUTPUT: I/O last 3 completed shifts: In: 3341.8 [I.V.:2791.8; IV Piggyback:550] Out: 705 [Urine:655; Blood:50]  PHYSICAL EXAMINATION: General:  Adult male on vent.  Appropriate for age.  Lying in bed comfortably.  He did nod his head and answers simple questions. Neuro: Vascular -1.  Sedated on fentanyl.  Opens eyes to verbal stimuli.  Able to nod his head to simple  questions.  Pupils are equal round reactive to light.  5 out of 5 muscle strength in all 4 extremities.  Positive cough.  Positive gag. HEENT:  ETT in place  Cardiovascular: Sinus rhythm.  S1-S2. Lungs: Fair air entry bilaterally.  No wheezing appreciated on exam. Abdomen: Abdomen soft and nontender.  Positive bowel sounds in all 4 quadrants. Musculoskeletal:  -edema bilateral femoral incisions are clean dry and intact.  Popliteal pulses intact bilaterally.  Tibial pulses intact bilaterally. Skin:  Warm, dry, surgical incision to  bilateral groin  LABS:  BMET Recent Labs  Lab 07/01/17 0654 07/01/17 1632 07/01/17 1805 07/01/17 2256 07/01/17 2354  NA 135 132* 139 138 133*  K 3.3* 4.0 4.1 6.1* 5.4*  CL 106 105  --   --  109  CO2 19* 16*  --   --  17*  BUN 20 23*  --   --  22*  CREATININE 1.38* 1.47*  --   --  1.41*  GLUCOSE 97 117*  --   --  141*    Electrolytes Recent Labs  Lab 06/30/17 0350 07/01/17 0654 07/01/17 1632 07/01/17 2354 07/02/17 0012  CALCIUM 7.9* 8.3* 7.9* 7.1*  --   MG 1.9 2.0  --   --  1.9  PHOS  --   --   --   --  4.1    CBC Recent Labs  Lab 07/01/17 1632  07/01/17 2256 07/01/17 2354 07/02/17 0612  WBC 22.8*  --   --  19.1* 16.4*  HGB 12.4*   < > 9.9* 9.8* 9.1*  HCT 36.4*   < > 29.0* 29.3* 27.4*  PLT 155  --   --  123* 147*   < > = values in this interval not displayed.    Coag's Recent Labs  Lab 06/29/17 0320 07/01/17 1632  APTT 47*  --   INR 1.23 1.04    Sepsis Markers Recent Labs  Lab 06/29/17 2128 07/01/17 2354 07/02/17 0319 07/02/17 0612  LATICACIDVEN  --  1.4 1.1 1.0  PROCALCITON 39.90  --  30.58  --     ABG Recent Labs  Lab 07/01/17 2256 07/02/17 0055 07/02/17 0447  PHART 7.236* 7.219* 7.276*  PCO2ART 46.7 39.9 45.5  PO2ART 160.0* 154.0* 187*    Liver Enzymes Recent Labs  Lab 07/01/17 1632  AST 40  ALT 27  ALKPHOS 81  BILITOT 1.2  ALBUMIN 2.4*    Cardiac Enzymes Recent Labs  Lab 06/28/17 2120 06/29/17 0320 06/29/17 0904  TROPONINI 0.45* 0.41* 0.34*    Glucose Recent Labs  Lab 06/29/17 0813 06/30/17 0731 07/01/17 0632 07/01/17 2343 07/02/17 0309 07/02/17 0609  GLUCAP 90 99 95 131* 125* 117*    Imaging Ct Abdomen Pelvis W Contrast  Result Date: 07/01/2017 CLINICAL DATA:  Left lower quadrant pain. EXAM: CT ABDOMEN AND PELVIS WITH CONTRAST TECHNIQUE: Multidetector CT imaging of the abdomen and pelvis was performed using the standard protocol following bolus administration of intravenous contrast. CONTRAST:  91mL  ISOVUE-300 IOPAMIDOL (ISOVUE-300) INJECTION 61% COMPARISON:  None. FINDINGS: Lower chest: Chronic interstitial changes noted in the peripheral lung bases bilaterally. Hepatobiliary: No focal abnormality within the liver parenchyma. There is no evidence for gallstones, gallbladder wall thickening, or pericholecystic fluid. No intrahepatic or extrahepatic biliary dilation. Pancreas: No focal mass lesion. No dilatation of the main duct. No intraparenchymal cyst. No peripancreatic edema. Spleen: Small subcapsular fluid collection in the spleen suggests a hematoma. Small defect in the subcapsular aspect of the posterior spleen (image  27/3) is compatible with a small infarct. Adrenals/Urinary Tract: Right adrenal gland unremarkable. 13 mm left adrenal nodule cannot be definitively characterized. Small hypodense lesions in each kidney are compatible with cysts. Cortical scarring noted in the upper pole of the right kidney. No evidence for hydroureter. Small bilateral bladder diverticuli suggests underlying component of bladder outlet obstruction. Stomach/Bowel: Stomach is nondistended. No gastric wall thickening. No evidence of outlet obstruction. Duodenum is normally positioned as is the ligament of Treitz. No small bowel wall thickening. No small bowel dilatation. The terminal ileum is normal. Vascular/Lymphatic: 6.0 x 6.6 cm infrarenal fusiform abdominal aortic aneurysm identified with substantial mural thrombus. There is periaortic edema/hemorrhage tracking to the left with a relatively large hematoma lateral to the left psoas muscle measuring 16.3 x 5.6 x 8.9 cm. Aneurysm extends into the bifurcation with prominent atherosclerosis in the origin of each common iliac artery. There is no gastrohepatic or hepatoduodenal ligament lymphadenopathy. No intraperitoneal or retroperitoneal lymphadenopathy. No pelvic sidewall lymphadenopathy. Reproductive: The prostate gland and seminal vesicles have normal imaging features.  Other: No intraperitoneal free fluid. Musculoskeletal: Bone windows reveal no worrisome lytic or sclerotic osseous lesions. Gas in the subcutaneous fat of the anterior abdominal wall bilaterally is presumably secondary to injections. IMPRESSION: 1. Fusiform infrarenal abdominal aortic aneurysm measures up to 6.6 cm and maximum diameter. There is Peri aneurysm all edema/hemorrhage with a large left-sided retroperitoneal hematoma lateral to the left psoas muscle in tracking down into the left pelvic sidewall. Imaging features are compatible with aortic aneurysm rupture. 2. Probable infarct posterior spleen with small subcapsular hematoma. 3. No substantial intraperitoneal free fluid. 4. Small low-density renal lesions bilaterally, likely cysts. 5. 13 mm left adrenal nodule cannot be further characterized on this study. Critical Value/emergent results were called by me at the time of interpretation on 07/01/2017 at 3:57 pm to Dr. Irine Seal , who verbally acknowledged these results. Electronically Signed   By: Misty Stanley M.D.   On: 07/01/2017 16:01   Dg Chest Port 1 View  Result Date: 07/02/2017 CLINICAL DATA:  Endotracheal and central line placements. EXAM: PORTABLE CHEST 1 VIEW COMPARISON:  06/28/2017 FINDINGS: Endotracheal tube placed with tip measuring 4.4 cm above the carina. Right central venous catheter with tip over the low SVC region. No pneumothorax. Shallow inspiration with atelectasis in the lung bases, increasing since prior study. Small bilateral pleural effusions. Probable emphysematous changes and chronic fibrosis in the lungs. Cardiac enlargement without vascular congestion. No edema or consolidation. IMPRESSION: Appliances appear in satisfactory position. Shallow inspiration with increasing atelectasis in the lung bases. Small bilateral pleural effusions. Chronic emphysematous changes and fibrosis in the lungs. Cardiac enlargement. Electronically Signed   By: Lucienne Capers M.D.   On:  07/02/2017 00:42     STUDIES:  CXR 4/4 > 1. Interstitial lung disease possibly chronic. A component of basilar bronchiectasis most likely present in the lung bases. Pleural scarring. Tortuous thoracic aorta. Heart size normal. No pulmonary venous Congestion. MR Brain 4/4 > Three acute nonhemorrhagic small LEFT cerebellar infarcts. Old small bilateral cerebellar infarcts. Old basal ganglia and thalamus infarcts. MRA Head 4/6 > Occlusion of the right M1 segment. Severe stenosis distal right vertebral artery. Mild to moderate stenosis in the posterior cerebral artery bilaterally. MR Brain W/C 4/6 > Normal enhancement postcontrast infusion. Small acute infarcts left cerebellum do not enhance MR Neck 4/6 >  Negative for carotid stenosis in the neck. Dominant left vertebral artery widely patent. Small right vertebral artery with moderate to severe stenosis  proximally and distally CT A/P 4/7 > 1. Fusiform infrarenal abdominal aortic aneurysm measures up to 6.6 cm and maximum diameter. There is Peri aneurysm all edema/hemorrhage with a large left-sided retroperitoneal hematoma lateral to the left psoas muscle in tracking down into the left pelvic sidewall. Imaging features are compatible with aortic aneurysm rupture. 2. Probable infarct posterior spleen with small subcapsular hematoma. 3. No substantial intraperitoneal free fluid. 4. Small low-density renal lesions bilaterally, likely cysts. 5. 13 mm left adrenal nodule cannot be further characterized on this study.  CULTURES: U/A 4/4 > Negative  Blood 4/4 > Streptococcus pneumoniae Blood 4/6 >>  Sputum 4/7 >>   ANTIBIOTICS: Azithromycin 4/4 -discontinued 06/29/2017 Rocephin 4/4 >>  Ancef 4/7   SIGNIFICANT EVENTS: 4/4 > Presents to ED  4/7 > Taken to OR   LINES/TUBES: RIJ CVC 4/7 >> ETT 4/7 >>   DISCUSSION: 62 year old male presents to ED on 4/4 with fever, chills, nonproductive cough, and chest pain. During stay blood cultures  positive for Pnemococcal Bacteremia. Concern for Bacterial endocarditis. On 4/7 patient with diffuse abdominal pain with drop in BP to systolic 29F with HR 621H.  Abdominal CAT scan showed ruptured AAA.  Patient taken for endovascular repair.  Patient also status post bilateral femoral thrombectomy   ASSESSMENT / PLAN:  PULMONARY A: Ventilatory dependent-intubated preoperatively for AAA repair H/O Tobacco Use  P:   Patient currently on minimal ventilatory support.  FiO2 has been titrated down from 50% to 30%.  Patient saturating 99%.  Patient comfortable on mechanical ventilator. If cleared by vascular surgery will extubate the patient today Trend CXR/ABG  Pulmonary Hygiene  VAP Bundle   CARDIOVASCULAR A:  Hemorrhagic Shock in setting of Ruptured Aortic Aneurysm s/p stent placement  S/P Bilateral Thrombectomy  Pericarditis  Concern for Bacterial Endocarditis  H/O HTN, HLD  P:  Cardiology Following.  Discussed the case with cardiology.  Will hold off on TEE for now and wait until patient is extubated and off of pressors.  Patient might go for TEE in the next 48 hours. Cardiac Monitoring  Wean Neo to achieve MAP > 65 TTE showed no vegetations.  RENAL A:   Chronic Kidney Disease Stage 3  P:   Trend BMP.  Creatinine stable at this time.  Monitor urine outputs. Very mild decrease in bicarb possibly secondary mild non-anion gap acidosis secondary to shock.  Patient is currently on bicarb drip.  Recommend to DC bicarb drip at this time. LA pending  Replace electrolytes as indicated Avoid Nephrotoxic medications   GASTROINTESTINAL A:   SUP  P:   NPO PPI  HEMATOLOGIC A:   Acute blood loss anemia secondary to ruptured aortic aneurysm and retroperitoneal bleed. P:  Hemoglobin stable at this time.  No overt signs of bleeding at this time. Hold all anticoagulation  Monitor CBC every 6 hours.  INFECTIOUS A:   Pnemococcal Bacteremia concerning for CNS involvement possible  endocarditis. P:   ID following > Rocephin  Trend WBC and Fever Curve  Follow Repeat Culture Data  Plans for TEE as above  Pro calcitonin trending down.  ENDOCRINE A:   No issues    P:   Trend Glucose  NEUROLOGIC A:   Sedation Needs  Left Cerebellar Infarcts (Believed due to Septic Emboli)  MRA with Chronic Right M1 Occlusion, Bilateral PCA Stenosis and Chronic Abdominal Right Vertebral Artery Stenosis  MRI with old bilateral basal ganglia lacunar infarcts  P:   RASS goal: 0/-1 Neurology Following  Wean Precedex/Fentanyl gtt to achieve RASS PRN Versed     FAMILY   - Inter-disciplinary family meet or Palliative Care meeting due by: 07/08/2017    Carlyon Prows D.O [facility] Pager: [phone]

## 2017-07-02 NOTE — Progress Notes (Signed)
STROKE TEAM PROGRESS NOTE   SUBJECTIVE (INTERVAL HISTORY) No family members present. Pt still intubated but awake alert, following commands. Plan to extubate today. He had emergent endovascular repair of ruptured AAA, bilateral iliofemoral endarterectomy, BLE thromboembolectomies yesterday and now POD #1. BP stable on neo. Still on rocephin for bacteremia. TEE postponed due to medical condition post surgery.   OBJECTIVE Temp:  [97.5 F (36.4 C)-99 F (37.2 C)] 98.1 F (36.7 C) (04/08 0737) Pulse Rate:  [51-92] 62 (04/08 0900) Cardiac Rhythm: Normal sinus rhythm (04/08 0800) Resp:  [0-24] 13 (04/08 0900) BP: (78-137)/(56-88) 112/66 (04/08 0900) SpO2:  [94 %-100 %] 100 % (04/08 0935) Arterial Line BP: (91-130)/(48-66) 102/52 (04/08 0900) FiO2 (%):  [40 %-60 %] 40 % (04/08 0935) Weight:  [143 lb 1.3 oz (64.9 kg)-143 lb 4.8 oz (65 kg)] 143 lb 4.8 oz (65 kg) (04/08 0500)  CBC:  Recent Labs  Lab 07/01/17 0654 07/01/17 1632  07/01/17 2354 07/02/17 0612  WBC 19.4* 22.8*  --  19.1* 16.4*  NEUTROABS 14.7* 19.9*  --   --   --   HGB 12.9* 12.4*   < > 9.8* 9.1*  HCT 37.7* 36.4*   < > 29.3* 27.4*  MCV 74.8* 75.1*  --  75.5* 75.5*  PLT 138* 155  --  123* 147*   < > = values in this interval not displayed.    Basic Metabolic Panel:  Recent Labs  Lab 07/01/17 0654 07/01/17 1632  07/01/17 2256 07/01/17 2354 07/02/17 0012  NA 135 132*   < > 138 133*  --   K 3.3* 4.0   < > 6.1* 5.4*  --   CL 106 105  --   --  109  --   CO2 19* 16*  --   --  17*  --   GLUCOSE 97 117*  --   --  141*  --   BUN 20 23*  --   --  22*  --   CREATININE 1.38* 1.47*  --   --  1.41*  --   CALCIUM 8.3* 7.9*  --   --  7.1*  --   MG 2.0  --   --   --   --  1.9  PHOS  --   --   --   --   --  4.1   < > = values in this interval not displayed.    Lipid Panel:     Component Value Date/Time   CHOL 128 06/29/2017 0320   TRIG 97 06/29/2017 0320   HDL 37 (L) 06/29/2017 0320   CHOLHDL 3.5 06/29/2017 0320   VLDL  19 06/29/2017 0320   LDLCALC 72 06/29/2017 0320   HgbA1c:  Lab Results  Component Value Date   HGBA1C 5.6 06/29/2017   Urine Drug Screen: No results found for: LABOPIA, COCAINSCRNUR, LABBENZ, AMPHETMU, THCU, LABBARB  Alcohol Level No results found for: [place] I have personally reviewed the radiological images below and agree with the radiology interpretations.  Mr Brain Wo Contrast 06/28/2017 IMPRESSION:  1. Three acute nonhemorrhagic small LEFT cerebellar infarcts.  2. Old small bilateral cerebellar infarcts. Old basal ganglia and thalamus infarcts.   Mr Jodene Nam Head Wo Contrast 06/30/2017 IMPRESSION:  Occlusion of the right M1 segment.  Severe stenosis distal right vertebral artery.  Mild to moderate stenosis in the posterior cerebral artery bilaterally.   Mr Jodene Nam Neck W Wo Contrast 06/30/2017 IMPRESSION:  Negative for carotid stenosis in the neck  Dominant left vertebral artery widely patent.  Small right vertebral artery with moderate to severe stenosis proximally and distally.  Mr Jeri Cos Contrast 06/30/2017 IMPRESSION:  Normal enhancement postcontrast infusion. Small acute infarcts left cerebellum do not enhance.   Transthoracic Echocardiogram  06/30/2017 Study Conclusions - Left ventricle: The cavity size was normal. There was moderate   focal basal hypertrophy. Systolic function was normal. The   estimated ejection fraction was in the range of 60% to 65%. Wall   motion was normal; there were no regional wall motion   abnormalities. The study is not technically sufficient to allow   evaluation of LV diastolic function. - Aortic valve: Trileaflet; mildly thickened, mildly calcified   leaflets. There was mild regurgitation.  Ct Abdomen Pelvis W Contrast 07/01/2017 IMPRESSION: 1. Fusiform infrarenal abdominal aortic aneurysm measures up to 6.6 cm and maximum diameter. There is Peri aneurysm all edema/hemorrhage with a large left-sided retroperitoneal hematoma lateral to  the left psoas muscle in tracking down into the left pelvic sidewall. Imaging features are compatible with aortic aneurysm rupture. 2. Probable infarct posterior spleen with small subcapsular hematoma. 3. No substantial intraperitoneal free fluid. 4. Small low-density renal lesions bilaterally, likely cysts. 5. 13 mm left adrenal nodule cannot be further characterized on this study.  Dg Chest Port 1 View  07/02/2017 IMPRESSION: Appliances appear in satisfactory position. Shallow inspiration with increasing atelectasis in the lung bases. Small bilateral pleural effusions. Chronic emphysematous changes and fibrosis in the lungs. Cardiac enlargement.     PHYSICAL EXAM Vitals:   07/02/17 0737 07/02/17 0800 07/02/17 0900 07/02/17 0935  BP: (!) 111/56 127/71 112/66   Pulse: 63 62 62   Resp: (!) 24 10 13    Temp: 98.1 F (36.7 C)     TempSrc: Oral     SpO2: 100% 100% 100% 100%  Weight:      Height:       Pleasant middle-aged Asian male not in distress. Afebrile. Head is nontraumatic. Neck is supple without bruit.    Cardiac exam no murmur or gallop. Lungs are clear to auscultation.   Neurological Exam ;  Awake Alert, intubated, but following commands. Attending to both side, blinking to visual threat bilaterally, eye movements full without nystagmus, PERRL. fundi were not visualized. Face symmetry not able to check due to ET tube. Normal strength BUE and BLE, normal tone, reflexes and coordination. Symmetrical sensation. Gait not tested.   ASSESSMENT/PLAN Mr. Lennard Capek is a 62 y.o. male with history of hypertension, history of tobacco use and previous strokes by imaging presenting with weakness, chills, dizziness, cough, left-sided chest pain, abdominal pain, nausea and vomiting. He did not receive IV t-PA due to late presentation.  Stroke: 3 acute LEFT cerebellar infarcts possibly septic emboli vs. hypotension in the setting of vascular stenosis.   MRI head with and without contrast - Three  acute nonhemorrhagic small LEFT cerebellar infarcts. Old small bilateral cerebellar infarcts. Old basal ganglia and thalamus infarcts.   MRA H&N - right M1 occlusion, likely chronic.  Bilateral PCA stenosis.  Terminal right vertebral artery stenosis.  2D Echo - EF 60-65%. Cardiac source of emboli identified.  LDL - 72  HgbA1c - 5.6  TEE pending  VTE prophylaxis -subcutaneous heparin No diet orders on file  No antithrombotic prior to admission, now on no antithrombotics due to anemia, AAA rupture and retroperitoneal bleeding  Patient counseled to be compliant with his antithrombotic medications  Ongoing aggressive stroke risk factor management  Therapy recommendations: pending  Disposition:  Pending  AAA rupture with large left retroperitoneal hematoma  Likely to explain hypotension and anemia  S/p emergent endovascular repair of ruptured AAA, bilateral iliofemoral endarterectomy, BLE thromboembolectomies   Clinically stable   Still on neo  VVS on board  Possible pericarditis and endocarditis  EKG concerning for pericarditis  Elevated troponin 0.34->0.45->0.41->0.34  Elevated WBC 22.8->19.1->16.4  Blood culture positive strep peumo  ID on board - on rocephin  Cardiology on board  TTE EF 60-65% no vegetation seen  TEE pending  PVD  Hx of smoking  s/p endovascular repair of ruptured AAA, bilateral iliofemoral endarterectomy, BLE thromboembolectomies   VVS on board  Intracranial vascular occlusion / stenosis  MRA H&N - right M1 occlusion, likely chronic. Bilateral PCA stenosis. Terminal right vertebral artery stenosis.  Risk factor modification   Avoid hypotension   Hypotension   Multifactorial  Likely due to AAA rupture, blood loss, sepsis  On neo  BP stable  Taper off neo as able  BP goal 110-140 due to right M1 occlusion but also AAA rupture.   Anemia   Likely due to blood loss, sepsis, endocarditis  CT large left retroperitoneal  hematoma  Hb 12.9->9.9->9.1  Close monitoring  PRBC if needed.  Hyperlipidemia  Lipid lowering medication PTA: none  LDL 72, goal < 70  Current lipid lowering medication: Lipitor 40 mg daily  Continue statin at discharge  Tobacco abuse  Current smoker  Smoking cessation counseling provided  Pt is willing to quit  Other Stroke Risk Factors  Advanced age  Hx stroke/TIA on imaging  Other Active Problems  Interstitial lung disease possibly chronic by CXR    Hospital day # 4  This patient is critically ill due to AAA rupture, sepsis, spetic shock, stroke, left M1 occlusion, anemia, hypotension and at significant risk of neurological worsening, death form recurrent stroke, heart failure, septic shock, anemia, hemorrhage. This patient's care requires constant monitoring of vital signs, hemodynamics, respiratory and cardiac monitoring, review of multiple databases, neurological assessment, discussion with family, other specialists and medical decision making of high complexity. I spent 45 minutes of neurocritical care time in the care of this patient.  Rosalin Hawking, MD PhD Stroke Neurology 07/02/2017 10:21 AM    To contact Stroke Continuity provider, please refer to [email or web address] After hours, contact General Neurology

## 2017-07-02 NOTE — Procedures (Signed)
Extubation Procedure Note  Patient Details:   Name: Thomas [name] DOB: June 11, 1955 MRN: [id number]   Airway Documentation:     Evaluation  O2 sats: stable throughout Complications: No apparent complications Patient did tolerate procedure well. Bilateral Breath Sounds: Clear, Diminished   Yes  2l/min [place] Incentive spirometer instructed 1.2L  Revonda Standard 07/02/2017, 11:59 AM

## 2017-07-02 NOTE — Progress Notes (Signed)
[place] Progress Note Patient Name: Thomas [name] DOB: 02/06/1956 MRN: [id number]   Date of Service  07/02/2017  HPI/Events of Note  Patient c/o being "gassy".  eICU Interventions  Will order: 1. Mylicon 80 mg PO Q 6 hours PRN.      Intervention Category Major Interventions: Other:  Lysle Dingwall 07/02/2017, 11:46 PM

## 2017-07-02 NOTE — Progress Notes (Signed)
Patient extubated and weaned off nasal cannula to room air and tolerating well. Pressors weaned off and arterial line removed due to positional placement. Nursing bedside swallow evaluation completed and passed, diet ordered per MD. Laurence Slate, PA paged in regards to patient getting up to recliner, she states there is no reason patient should remain on bedrest. Up with 1 assist to chair.  75cc Fentanyl wasted down sink and witnessed by [employer], Therapist, sports.

## 2017-07-02 NOTE — Progress Notes (Signed)
  Speech Language Pathology Treatment: Cognitive-Linquistic  Patient Details Name: Catlin Aycock MRN: [id number] DOB: 1956-01-25 Today's Date: 07/02/2017 Time: 8841-6606 SLP Time Calculation (min) (ACUTE ONLY): 15 min  Assessment / Plan / Recommendation Clinical Impression  Since initial assessment for speech-language-cognitive abilities pt underwent emergent surgery for AAA and extubated around noon today. Pt reports he lives with his cousin and is independent which his daughter confirms. She feels is not 100% at baseline but feels it is from prior sedation/recent anesthesia. Pt feels his memory is "good" and that he remembers better than his co-workers. No difficulty attending to this therapist. SLP reviewed memory strategies should he have difficulty retaining information. No further ST needed.    HPI HPI: 62 y.o. male with medical history significant of ongoing tobacco abuse otherwise unremarkable presented to the ED with sudden onset of subjective fevers, chills, diaphoresis, nonproductive cough that started at 4 AM on the day of admission with 3 episodes of emesis and some nausea.  Patient also with complaints of sore throat.  Patient stated that the night prior to admission around 10 PM had an episode of left upper chest pain describes as a sharp pain that lasted approximately 2 hours and subsequently resolved with no further recurrence.      SLP Plan  All goals met       Recommendations                   Follow up Recommendations: None SLP Visit Diagnosis: Cognitive communication deficit (T01.601) Plan: All goals met       GO                Houston Siren 07/02/2017, 3:57 PM  Orbie Pyo Colvin Caroli.Ed [employer] [phone]

## 2017-07-02 NOTE — Progress Notes (Signed)
RT note- Ventilator changes made by RN per order.

## 2017-07-02 NOTE — Progress Notes (Addendum)
Progress Note  Patient Name: Thomas [name] Date of Encounter: 07/02/2017  Primary Cardiologist: Sanda Klein, MD   Subjective   Intubated but awake. Had AAA repair endovascular last night.   Inpatient Medications    Scheduled Meds: . atorvastatin  40 mg Oral q1800  . chlorhexidine gluconate (MEDLINE KIT)  15 mL Mouth Rinse BID  . fentaNYL (SUBLIMAZE) injection  50 mcg Intravenous Once  . hydrocortisone sod succinate (SOLU-CORTEF) inj  50 mg Intravenous Q6H  . insulin aspart  1-3 Units Subcutaneous Q6H  . mouth rinse  15 mL Mouth Rinse 10 times per day  . pantoprazole (PROTONIX) IV  40 mg Intravenous Daily   Continuous Infusions: . cefTRIAXone (ROCEPHIN)  IV Stopped (07/02/17 0505)  . fentaNYL infusion INTRAVENOUS 150 mcg/hr (07/02/17 0600)  . phenylephrine (NEO-SYNEPHRINE) Adult infusion 50 mcg/min (07/02/17 0735)  .  sodium bicarbonate (isotonic) infusion in sterile water 125 mL/hr at 07/02/17 0600   PRN Meds: acetaminophen (TYLENOL) oral liquid 160 mg/5 mL **OR** acetaminophen, fentaNYL, iopamidol, midazolam, midazolam, ondansetron (ZOFRAN) IV   Vital Signs    Vitals:   07/02/17 0500 07/02/17 0600 07/02/17 0700 07/02/17 0737  BP: 1_0 (!) 111/56  Pulse: (!) 56 (!) 57 61 63  Resp: (!) 0 (!) 6 (!) 0 (!) 24  Temp:      TempSrc:      SpO2: 100% 100% 100% 100%  Weight: 143 lb 4.8 oz (65 kg)     Height:        Intake/Output Summary (Last 24 hours) at 07/02/2017 0854 Last data filed at 07/02/2017 0600 Gross per 24 hour  Intake 3241.78 ml  Output 705 ml  Net 2536.78 ml   Filed Weights   07/01/17 0552 07/02/17 0000 07/02/17 0500  Weight: 139 lb 8.8 oz (63.3 kg) 143 lb 1.3 oz (64.9 kg) 143 lb 4.8 oz (65 kg)    Telemetry    NSR/ sinus tachy previously - Personally Reviewed  ECG    NSR RBBB - Personally Reviewed  Physical Exam   GEN: No acute distress.  Intubated, awake Neck: No JVD Cardiac: RRR, no murmurs, rubs, or gallops.  Respiratory: Clear  to auscultation bilaterally. GI: Soft, nontender, non-distended  MS: No edema; No deformity. Neuro:  Nonfocal  Psych: Normal affect   Labs    Chemistry Recent Labs  Lab 07/01/17 0654 07/01/17 1632 07/01/17 1805 07/01/17 2256 07/01/17 2354  NA 135 132* 139 138 133*  K 3.3* 4.0 4.1 6.1* 5.4*  CL 106 105  --   --  109  CO2 19* 16*  --   --  17*  GLUCOSE 97 117*  --   --  141*  BUN 20 23*  --   --  22*  CREATININE 1.38* 1.47*  --   --  1.41*  CALCIUM 8.3* 7.9*  --   --  7.1*  PROT  --  6.4*  --   --   --   ALBUMIN  --  2.4*  --   --   --   AST  --  40  --   --   --   ALT  --  27  --   --   --   ALKPHOS  --  81  --   --   --   BILITOT  --  1.2  --   --   --   GFRNONAA 53* 49*  --   --  52*  GFRAA >60 57*  --   --  60*  ANIONGAP 10 11  --   --  7     Hematology Recent Labs  Lab 07/01/17 1632  07/01/17 2256 07/01/17 2354 07/02/17 0612  WBC 22.8*  --   --  19.1* 16.4*  RBC 4.85  --   --  3.88* 3.63*  HGB 12.4*   < > 9.9* 9.8* 9.1*  HCT 36.4*   < > 29.0* 29.3* 27.4*  MCV 75.1*  --   --  75.5* 75.5*  MCH 25.6*  --   --  25.3* 25.1*  MCHC 34.1  --   --  33.4 33.2  RDW 14.0  --   --  14.3 14.4  PLT 155  --   --  123* 147*   < > = values in this interval not displayed.    Cardiac Enzymes Recent Labs  Lab 06/28/17 1544 06/28/17 2120 06/29/17 0320 06/29/17 0904  TROPONINI 0.34* 0.45* 0.41* 0.34*    Recent Labs  Lab 06/28/17 1552  TROPIPOC 0.33*     BNPNo results for input(s): BNP, PROBNP in the last 168 hours.   DDimer No results for input(s): DDIMER in the last 168 hours.   Radiology    Mr Jodene Nam Head Wo Contrast  Result Date: 06/30/2017 CLINICAL DATA:  Stroke EXAM: MRA HEAD WITHOUT CONTRAST TECHNIQUE: Angiographic images of the Circle of Willis were obtained using MRA technique without intravenous contrast. COMPARISON:  MRI head 06/28/2017 FINDINGS: Severe stenosis distal right vertebral artery, better seen on the MRA neck from today. Left vertebral artery  patent to the basilar. Moderate stenosis origin of right AICA which contributes to the right PICA. Bilateral PICA patent. Basilar patent. Superior cerebellar and posterior cerebral arteries patent bilaterally. Mild to moderate stenosis in the posterior cerebral artery bilaterally. Internal carotid artery widely patent bilaterally. Occlusion of the proximal right anterior cerebral artery Left middle cerebral artery patent with mild irregularity distally. Anterior cerebral arteries patent bilaterally. Negative for cerebral aneurysm. IMPRESSION: Occlusion of the right M1 segment. Severe stenosis distal right vertebral artery. Mild to moderate stenosis in the posterior cerebral artery bilaterally. Electronically Signed   By: Franchot Gallo M.D.   On: 06/30/2017 12:51   Mr Jodene Nam Neck W Wo Contrast  Result Date: 06/30/2017 CLINICAL DATA:  Stroke EXAM: MRA NECK WITHOUT AND WITH CONTRAST TECHNIQUE: Multiplanar and multiecho pulse sequences of the neck were obtained without and with intravenous contrast. Angiographic images of the neck were obtained using MRA technique without and with intravenous contrast. CONTRAST:  27m MULTIHANCE GADOBENATE DIMEGLUMINE 529 MG/ML IV SOLN COMPARISON:  MRA head from today FINDINGS: Antegrade flow in carotid and vertebral arteries bilaterally. Left vertebral artery dominant and widely patent Small right vertebral artery with moderate to severe stenosis proximally and distally. This appears to have a small contribution to the basilar Carotid bifurcation widely patent bilaterally without carotid stenosis. IMPRESSION: Negative for carotid stenosis in the neck Dominant left vertebral artery widely patent. Small right vertebral artery with moderate to severe stenosis proximally and distally. Electronically Signed   By: CFranchot GalloM.D.   On: 06/30/2017 12:53   Mr Brain W Contrast  Result Date: 06/30/2017 CLINICAL DATA:  Stroke EXAM: MRI HEAD WITH CONTRAST TECHNIQUE: Multiplanar, multiecho  pulse sequences of the brain and surrounding structures were obtained with intravenous contrast. CONTRAST:  120mMULTIHANCE GADOBENATE DIMEGLUMINE 529 MG/ML IV SOLN COMPARISON:  06/28/2017 FINDINGS: Postcontrast imaging performed. Diffusion-weighted imaging not repeated. Small acute infarct left cerebellum do not enhance. No enhancing mass lesion. Generalized atrophy.  Chronic infarcts in the corona radiata bilaterally. IMPRESSION: Normal enhancement postcontrast infusion. Small acute infarcts left cerebellum do not enhance. Electronically Signed   By: Franchot Gallo M.D.   On: 06/30/2017 12:55   Ct Abdomen Pelvis W Contrast  Result Date: 07/01/2017 CLINICAL DATA:  Left lower quadrant pain. EXAM: CT ABDOMEN AND PELVIS WITH CONTRAST TECHNIQUE: Multidetector CT imaging of the abdomen and pelvis was performed using the standard protocol following bolus administration of intravenous contrast. CONTRAST:  9m ISOVUE-300 IOPAMIDOL (ISOVUE-300) INJECTION 61% COMPARISON:  None. FINDINGS: Lower chest: Chronic interstitial changes noted in the peripheral lung bases bilaterally. Hepatobiliary: No focal abnormality within the liver parenchyma. There is no evidence for gallstones, gallbladder wall thickening, or pericholecystic fluid. No intrahepatic or extrahepatic biliary dilation. Pancreas: No focal mass lesion. No dilatation of the main duct. No intraparenchymal cyst. No peripancreatic edema. Spleen: Small subcapsular fluid collection in the spleen suggests a hematoma. Small defect in the subcapsular aspect of the posterior spleen (image 27/3) is compatible with a small infarct. Adrenals/Urinary Tract: Right adrenal gland unremarkable. 13 mm left adrenal nodule cannot be definitively characterized. Small hypodense lesions in each kidney are compatible with cysts. Cortical scarring noted in the upper pole of the right kidney. No evidence for hydroureter. Small bilateral bladder diverticuli suggests underlying component of  bladder outlet obstruction. Stomach/Bowel: Stomach is nondistended. No gastric wall thickening. No evidence of outlet obstruction. Duodenum is normally positioned as is the ligament of Treitz. No small bowel wall thickening. No small bowel dilatation. The terminal ileum is normal. Vascular/Lymphatic: 6.0 x 6.6 cm infrarenal fusiform abdominal aortic aneurysm identified with substantial mural thrombus. There is periaortic edema/hemorrhage tracking to the left with a relatively large hematoma lateral to the left psoas muscle measuring 16.3 x 5.6 x 8.9 cm. Aneurysm extends into the bifurcation with prominent atherosclerosis in the origin of each common iliac artery. There is no gastrohepatic or hepatoduodenal ligament lymphadenopathy. No intraperitoneal or retroperitoneal lymphadenopathy. No pelvic sidewall lymphadenopathy. Reproductive: The prostate gland and seminal vesicles have normal imaging features. Other: No intraperitoneal free fluid. Musculoskeletal: Bone windows reveal no worrisome lytic or sclerotic osseous lesions. Gas in the subcutaneous fat of the anterior abdominal wall bilaterally is presumably secondary to injections. IMPRESSION: 1. Fusiform infrarenal abdominal aortic aneurysm measures up to 6.6 cm and maximum diameter. There is Peri aneurysm all edema/hemorrhage with a large left-sided retroperitoneal hematoma lateral to the left psoas muscle in tracking down into the left pelvic sidewall. Imaging features are compatible with aortic aneurysm rupture. 2. Probable infarct posterior spleen with small subcapsular hematoma. 3. No substantial intraperitoneal free fluid. 4. Small low-density renal lesions bilaterally, likely cysts. 5. 13 mm left adrenal nodule cannot be further characterized on this study. Critical Value/emergent results were called by me at the time of interpretation on 07/01/2017 at 3:57 pm to Dr. DIrine Seal, who verbally acknowledged these results. Electronically Signed   By: EMisty StanleyM.D.   On: 07/01/2017 16:01   Dg Chest Port 1 View  Result Date: 07/02/2017 CLINICAL DATA:  Endotracheal and central line placements. EXAM: PORTABLE CHEST 1 VIEW COMPARISON:  06/28/2017 FINDINGS: Endotracheal tube placed with tip measuring 4.4 cm above the carina. Right central venous catheter with tip over the low SVC region. No pneumothorax. Shallow inspiration with atelectasis in the lung bases, increasing since prior study. Small bilateral pleural effusions. Probable emphysematous changes and chronic fibrosis in the lungs. Cardiac enlargement without vascular congestion. No edema or consolidation. IMPRESSION: Appliances appear in satisfactory  position. Shallow inspiration with increasing atelectasis in the lung bases. Small bilateral pleural effusions. Chronic emphysematous changes and fibrosis in the lungs. Cardiac enlargement. Electronically Signed   By: Lucienne Capers M.D.   On: 07/02/2017 00:42    Cardiac Studies   - Left ventricle: The cavity size was normal. There was moderate   focal basal hypertrophy. Systolic function was normal. The   estimated ejection fraction was in the range of 60% to 65%. Wall   motion was normal; there were no regional wall motion   abnormalities. The study is not technically sufficient to allow   evaluation of LV diastolic function. - Aortic valve: Trileaflet; mildly thickened, mildly calcified   leaflets. There was mild regurgitation.  Patient Profile     62 y.o. male with stroke, strep pneumoniae, AAA endovascular repair 07/01/17   Assessment & Plan    We were asked to see for TEE It may not be unreasonable to allow him to recover a day or 2 post op endovascular repair of AAA prior to TEE. The nurse taking care of him relayed message that prior to extubation they requested TEE be done because of difficult airway. Will discuss further.   Candee Furbish, MD  Addendum: discussed with CCM. I would like to give him a day or two to recover from AAA  and not rush into TEE. Continue to treat strep. OK from my standpoint for extubation when able. No need to be intubated for TEE.   Will hold TEE for now, consider tomorrow or Wed.    For questions or updates, please contact [place] Please consult [email or web address] for contact info under Cardiology/STEMI.      Signed, Candee Furbish, MD  07/02/2017, 8:54 AM

## 2017-07-02 NOTE — Care Management Note (Signed)
Case Management Note Marvetta Gibbons RN, BSN Unit 4E-Case Manager- [place] coverage [phone]  Patient Details  Name: Thomas [name] MRN: [id number] Date of Birth: 02-04-1956  Subjective/Objective:  Pt admitted with weakness, chills, dizziness, cough, left-sided chest pain, abdominal pain, nausea and vomiting to [facility]- tx to [facility] for acute stroke- MRI positive for acute left cerebellar infarcts possibly septic emboli vs. hypotension in the setting of vascular stenosis.  S/p emergent endovascular repair of ruptured AAA, bilateral iliofemoral endarterectomy, BLE thromboembolectomies on 07/01/17- remains on vent post op with plan to extubate 07/02/17                Action/Plan: PTA  Pt lived at home was independent with ADLs and worked as a Presenter, broadcasting. Pt will have family/friends to assist when ready to transition home.  Per MD note most likely will need extended coarse of abx ?iv for discharge- CM to follow for transition of care needs.   Expected Discharge Date:                 Expected Discharge Plan:  OP Rehab  In-House Referral:     Discharge planning Services  CM Consult  Post Acute Care Choice:    Choice offered to:     DME Arranged:    DME Agency:     HH Arranged:    [place] Agency:  [place]  Status of Service:  In process, will continue to follow  If discussed at Long Length of Stay Meetings, dates discussed:    Discharge Disposition:   Additional Comments:  Dawayne Patricia, RN 07/02/2017, 10:32 AM

## 2017-07-02 NOTE — Progress Notes (Signed)
Subjective:  Intubated on the ventilator but alert  Antibiotics:  Anti-infectives (From admission, onward)   Start     Dose/Rate Route Frequency Ordered Stop   06/30/17 1600  cefTRIAXone (ROCEPHIN) 2 g in sodium chloride 0.9 % 100 mL IVPB     2 g 200 mL/hr over 30 Minutes Intravenous Every 12 hours 06/30/17 1354     06/29/17 2200  azithromycin (ZITHROMAX) tablet 500 mg  Status:  Discontinued     500 mg Oral Daily at bedtime 06/29/17 1159 06/29/17 1454   06/29/17 1500  cefTRIAXone (ROCEPHIN) 2 g in sodium chloride 0.9 % 100 mL IVPB  Status:  Discontinued     2 g 200 mL/hr over 30 Minutes Intravenous Every 24 hours 06/29/17 1454 06/30/17 1354   06/28/17 2000  cefTRIAXone (ROCEPHIN) 1 g in sodium chloride 0.9 % 100 mL IVPB  Status:  Discontinued     1 g 200 mL/hr over 30 Minutes Intravenous Every 24 hours 06/28/17 1839 06/29/17 1454   06/28/17 2000  azithromycin (ZITHROMAX) 500 mg in sodium chloride 0.9 % 250 mL IVPB  Status:  Discontinued     500 mg 250 mL/hr over 60 Minutes Intravenous Every 24 hours 06/28/17 1839 06/29/17 1159      Medications: Scheduled Meds: . atorvastatin  40 mg Oral q1800  . chlorhexidine gluconate (MEDLINE KIT)  15 mL Mouth Rinse BID  . fentaNYL (SUBLIMAZE) injection  50 mcg Intravenous Once  . hydrocortisone sod succinate (SOLU-CORTEF) inj  50 mg Intravenous Q6H  . insulin aspart  1-3 Units Subcutaneous Q6H  . mouth rinse  15 mL Mouth Rinse 10 times per day  . pantoprazole (PROTONIX) IV  40 mg Intravenous Daily   Continuous Infusions: . cefTRIAXone (ROCEPHIN)  IV Stopped (07/02/17 0505)  . fentaNYL infusion INTRAVENOUS 150 mcg/hr (07/02/17 0600)  . phenylephrine (NEO-SYNEPHRINE) Adult infusion 50 mcg/min (07/02/17 0735)  .  sodium bicarbonate (isotonic) infusion in sterile water 125 mL/hr at 07/02/17 0600   PRN Meds:.acetaminophen (TYLENOL) oral liquid 160 mg/5 mL **OR** acetaminophen, fentaNYL, iopamidol, midazolam, midazolam, ondansetron  (ZOFRAN) IV    Objective: Weight change: 3 lb 8.4 oz (1.6 kg)  Intake/Output Summary (Last 24 hours) at 07/02/2017 1041 Last data filed at 07/02/2017 0955 Gross per 24 hour  Intake 3241.78 ml  Output 845 ml  Net 2396.78 ml   Blood pressure 118/69, pulse 66, temperature 98.1 F (36.7 C), temperature source Oral, resp. rate (!) 7, height _0  (1.651 m), weight 143 lb 4.8 oz (65 kg), SpO2 100 %. Temp:  [97.5 F (36.4 C)-99 F (37.2 C)] 98.1 F (36.7 C) (04/08 0737) Pulse Rate:  [51-92] 66 (04/08 1000) Resp:  [0-24] 7 (04/08 1000) BP: (78-137)/(56-88) 118/69 (04/08 1000) SpO2:  [94 %-100 %] 100 % (04/08 1000) Arterial Line BP: (91-130)/(48-95) 99/95 (04/08 1000) FiO2 (%):  [40 %-60 %] 40 % (04/08 0935) Weight:  [143 lb 1.3 oz (64.9 kg)-143 lb 4.8 oz (65 kg)] 143 lb 4.8 oz (65 kg) (04/08 0500)  Physical Exam: General: Alert and awake, intubated  HEENT: anicteric sclera, pupils reactive to light and accommodation, EOMI CVS regular rate, normal r,  no murmur rubs or gallops Chest: Fairly clear to auscultation anteriorly abdomen: soft nontender, nondistended, normal bowel sounds, Extremities: no  clubbing or edema noted bilaterally Skin: Surgical wounds are clean Neuro: No new focal deficits  CBC:  CBC Latest Ref Rng & Units 07/02/2017 07/01/2017 07/01/2017  WBC 4.0 - 10.5 K/uL  16.4(H) 19.1(H) -  Hemoglobin 13.0 - 17.0 g/dL 9.1(L) 9.8(L) 9.9(L)  Hematocrit 39.0 - 52.0 % 27.4(L) 29.3(L) 29.0(L)  Platelets 150 - 400 K/uL 147(L) 123(L) -     BMET Recent Labs    07/01/17 1632  07/01/17 2256 07/01/17 2354  NA 132*   < > 138 133*  K 4.0   < > 6.1* 5.4*  CL 105  --   --  109  CO2 16*  --   --  17*  GLUCOSE 117*  --   --  141*  BUN 23*  --   --  22*  CREATININE 1.47*  --   --  1.41*  CALCIUM 7.9*  --   --  7.1*   < > = values in this interval not displayed.     Liver Panel  Recent Labs    07/01/17 1632  PROT 6.4*  ALBUMIN 2.4*  AST 40  ALT 27  ALKPHOS 81  BILITOT 1.2        Sedimentation Rate No results for input(s): ESRSEDRATE in the last 72 hours. C-Reactive Protein No results for input(s): CRP in the last 72 hours.  Micro Results: Recent Results (from the past 720 hour(s))  Culture, blood (Routine X 2) w Reflex to ID Panel     Status: Abnormal   Collection Time: 06/28/17  9:10 PM  Result Value Ref Range Status   Specimen Description   Final    BLOOD RIGHT ANTECUBITAL Performed at [facility] [address] [place], [place] [address]    Special Requests   Final    BOTTLES DRAWN AEROBIC AND ANAEROBIC Blood Culture adequate volume Performed at [facility] [address] [place], [place] [address]    Culture  Setup Time   Final    GRAM POSITIVE COCCI IN CHAINS IN BOTH AEROBIC AND ANAEROBIC BOTTLES CRITICAL VALUE NOTED.  VALUE IS CONSISTENT WITH PREVIOUSLY REPORTED AND CALLED VALUE.    Culture (A)  Final    STREPTOCOCCUS PNEUMONIAE SUSCEPTIBILITIES PERFORMED ON PREVIOUS CULTURE WITHIN THE LAST 5 DAYS. Performed at [facility], [place] [address], [place], [place] [address]    Report Status 07/01/2017 FINAL  Final  Culture, blood (Routine X 2) w Reflex to ID Panel     Status: Abnormal   Collection Time: 06/28/17  9:10 PM  Result Value Ref Range Status   Specimen Description   Final    BLOOD LEFT HAND Performed at [facility] [address] [place], [place] [address]    Special Requests   Final    BOTTLES DRAWN AEROBIC AND ANAEROBIC Blood Culture adequate volume Performed at [facility] [address] [place], [place] [address]    Culture  Setup Time   Final    GRAM POSITIVE COCCI IN CHAINS IN BOTH AEROBIC AND ANAEROBIC BOTTLES CRITICAL RESULT CALLED TO, READ BACK BY AND VERIFIED WITH: D. Wofford PharmD 14:45 06/29/17 ([email or web address]) Performed at [facility], [place] [address] [place], [place] [address]    Culture STREPTOCOCCUS PNEUMONIAE (A)   Final   Report Status 07/01/2017 FINAL  Final   Organism ID, Bacteria STREPTOCOCCUS PNEUMONIAE  Final      Susceptibility   Streptococcus pneumoniae - MIC*    ERYTHROMYCIN <=0.12 SENSITIVE Sensitive     LEVOFLOXACIN 0.5 SENSITIVE Sensitive     PENICILLIN (meningitis) 0.25 RESISTANT Resistant     PENICILLIN (non-meningitis) 0.25 SENSITIVE Sensitive     CEFTRIAXONE (  non-meningitis) <=0.12 SENSITIVE Sensitive     CEFTRIAXONE (meningitis) <=0.12 SENSITIVE Sensitive     * STREPTOCOCCUS PNEUMONIAE  Blood Culture ID Panel (Reflexed)     Status: Abnormal   Collection Time: 06/28/17  9:10 PM  Result Value Ref Range Status   Enterococcus species NOT DETECTED NOT DETECTED Final   Listeria monocytogenes NOT DETECTED NOT DETECTED Final   Staphylococcus species NOT DETECTED NOT DETECTED Final   Staphylococcus aureus NOT DETECTED NOT DETECTED Final   Streptococcus species DETECTED (A) NOT DETECTED Final    Comment: CRITICAL RESULT CALLED TO, READ BACK BY AND VERIFIED WITH: D. Wofford PharmD 14:45 06/29/17 ([email or web address])    Streptococcus agalactiae NOT DETECTED NOT DETECTED Final   Streptococcus pneumoniae DETECTED (A) NOT DETECTED Final    Comment: CRITICAL RESULT CALLED TO, READ BACK BY AND VERIFIED WITH: D. Wofford PharmD 14:45 06/29/17 ([email or web address])    Streptococcus pyogenes NOT DETECTED NOT DETECTED Final   Acinetobacter baumannii NOT DETECTED NOT DETECTED Final   Enterobacteriaceae species NOT DETECTED NOT DETECTED Final   Enterobacter cloacae complex NOT DETECTED NOT DETECTED Final   Escherichia coli NOT DETECTED NOT DETECTED Final   Klebsiella oxytoca NOT DETECTED NOT DETECTED Final   Klebsiella pneumoniae NOT DETECTED NOT DETECTED Final   Proteus species NOT DETECTED NOT DETECTED Final   Serratia marcescens NOT DETECTED NOT DETECTED Final   Haemophilus influenzae NOT DETECTED NOT DETECTED Final   Neisseria meningitidis NOT DETECTED NOT DETECTED Final   Pseudomonas aeruginosa NOT DETECTED NOT  DETECTED Final   Candida albicans NOT DETECTED NOT DETECTED Final   Candida glabrata NOT DETECTED NOT DETECTED Final   Candida krusei NOT DETECTED NOT DETECTED Final   Candida parapsilosis NOT DETECTED NOT DETECTED Final   Candida tropicalis NOT DETECTED NOT DETECTED Final    Comment: Performed at [facility], [place] [address] [place], [place] [address]  Culture, blood (routine x 2)     Status: None (Preliminary result)   Collection Time: 06/30/17  2:18 PM  Result Value Ref Range Status   Specimen Description BLOOD RIGHT ARM  Final   Special Requests   Final    BOTTLES DRAWN AEROBIC AND ANAEROBIC Blood Culture adequate volume   Culture   Final    NO GROWTH 1 DAY Performed at [facility], [place] [address] [place], [place] [address]    Report Status PENDING  Incomplete  Culture, blood (routine x 2)     Status: None (Preliminary result)   Collection Time: 06/30/17  2:23 PM  Result Value Ref Range Status   Specimen Description BLOOD RIGHT FOREARM  Final   Special Requests   Final    BOTTLES DRAWN AEROBIC AND ANAEROBIC Blood Culture adequate volume   Culture   Final    NO GROWTH 1 DAY Performed at [facility], [place] [address] [place], [place] [address]    Report Status PENDING  Incomplete  MRSA PCR Screening     Status: None   Collection Time: 07/01/17 11:42 PM  Result Value Ref Range Status   MRSA by PCR NEGATIVE NEGATIVE Final    Comment:        The GeneXpert MRSA Assay (FDA approved for NASAL specimens only), is one component of a comprehensive MRSA colonization surveillance program. It is not intended to diagnose MRSA infection nor to guide or monitor treatment for MRSA infections. Performed at [facility], [place] [address], [place], [place] [address]   Culture, respiratory (NON-Expectorated)  Status: None (Preliminary result)   Collection Time: 07/02/17  4:45 AM  Result Value Ref Range Status   Specimen Description TRACHEAL ASPIRATE  Final    Special Requests NONE  Final   Gram Stain   Final    FEW WBC PRESENT, PREDOMINANTLY PMN FEW GRAM NEGATIVE RODS FEW GRAM POSITIVE COCCI IN PAIRS RARE GRAM NEGATIVE COCCOBACILLI Performed at [facility], [address] [place], [place] [address]    Culture PENDING  Incomplete   Report Status PENDING  Incomplete    Studies/Results: Mr Virgel Paling TM Contrast  Result Date: 06/30/2017 CLINICAL DATA:  Stroke EXAM: MRA HEAD WITHOUT CONTRAST TECHNIQUE: Angiographic images of the Circle of Willis were obtained using MRA technique without intravenous contrast. COMPARISON:  MRI head 06/28/2017 FINDINGS: Severe stenosis distal right vertebral artery, better seen on the MRA neck from today. Left vertebral artery patent to the basilar. Moderate stenosis origin of right AICA which contributes to the right PICA. Bilateral PICA patent. Basilar patent. Superior cerebellar and posterior cerebral arteries patent bilaterally. Mild to moderate stenosis in the posterior cerebral artery bilaterally. Internal carotid artery widely patent bilaterally. Occlusion of the proximal right anterior cerebral artery Left middle cerebral artery patent with mild irregularity distally. Anterior cerebral arteries patent bilaterally. Negative for cerebral aneurysm. IMPRESSION: Occlusion of the right M1 segment. Severe stenosis distal right vertebral artery. Mild to moderate stenosis in the posterior cerebral artery bilaterally. Electronically Signed   By: Franchot Gallo M.D.   On: 06/30/2017 12:51   Mr Jodene Nam Neck W Wo Contrast  Result Date: 06/30/2017 CLINICAL DATA:  Stroke EXAM: MRA NECK WITHOUT AND WITH CONTRAST TECHNIQUE: Multiplanar and multiecho pulse sequences of the neck were obtained without and with intravenous contrast. Angiographic images of the neck were obtained using MRA technique without and with intravenous contrast. CONTRAST:  47m MULTIHANCE GADOBENATE DIMEGLUMINE 529 MG/ML IV SOLN COMPARISON:  MRA head from today  FINDINGS: Antegrade flow in carotid and vertebral arteries bilaterally. Left vertebral artery dominant and widely patent Small right vertebral artery with moderate to severe stenosis proximally and distally. This appears to have a small contribution to the basilar Carotid bifurcation widely patent bilaterally without carotid stenosis. IMPRESSION: Negative for carotid stenosis in the neck Dominant left vertebral artery widely patent. Small right vertebral artery with moderate to severe stenosis proximally and distally. Electronically Signed   By: CFranchot GalloM.D.   On: 06/30/2017 12:53   Mr Brain W Contrast  Result Date: 06/30/2017 CLINICAL DATA:  Stroke EXAM: MRI HEAD WITH CONTRAST TECHNIQUE: Multiplanar, multiecho pulse sequences of the brain and surrounding structures were obtained with intravenous contrast. CONTRAST:  168mMULTIHANCE GADOBENATE DIMEGLUMINE 529 MG/ML IV SOLN COMPARISON:  06/28/2017 FINDINGS: Postcontrast imaging performed. Diffusion-weighted imaging not repeated. Small acute infarct left cerebellum do not enhance. No enhancing mass lesion. Generalized atrophy. Chronic infarcts in the corona radiata bilaterally. IMPRESSION: Normal enhancement postcontrast infusion. Small acute infarcts left cerebellum do not enhance. Electronically Signed   By: ChFranchot Gallo.D.   On: 06/30/2017 12:55   Ct Abdomen Pelvis W Contrast  Result Date: 07/01/2017 CLINICAL DATA:  Left lower quadrant pain. EXAM: CT ABDOMEN AND PELVIS WITH CONTRAST TECHNIQUE: Multidetector CT imaging of the abdomen and pelvis was performed using the standard protocol following bolus administration of intravenous contrast. CONTRAST:  5017mSOVUE-300 IOPAMIDOL (ISOVUE-300) INJECTION 61% COMPARISON:  None. FINDINGS: Lower chest: Chronic interstitial changes noted in the peripheral lung bases bilaterally. Hepatobiliary: No focal abnormality within the liver parenchyma. There is  no evidence for gallstones, gallbladder wall thickening,  or pericholecystic fluid. No intrahepatic or extrahepatic biliary dilation. Pancreas: No focal mass lesion. No dilatation of the main duct. No intraparenchymal cyst. No peripancreatic edema. Spleen: Small subcapsular fluid collection in the spleen suggests a hematoma. Small defect in the subcapsular aspect of the posterior spleen (image 27/3) is compatible with a small infarct. Adrenals/Urinary Tract: Right adrenal gland unremarkable. 13 mm left adrenal nodule cannot be definitively characterized. Small hypodense lesions in each kidney are compatible with cysts. Cortical scarring noted in the upper pole of the right kidney. No evidence for hydroureter. Small bilateral bladder diverticuli suggests underlying component of bladder outlet obstruction. Stomach/Bowel: Stomach is nondistended. No gastric wall thickening. No evidence of outlet obstruction. Duodenum is normally positioned as is the ligament of Treitz. No small bowel wall thickening. No small bowel dilatation. The terminal ileum is normal. Vascular/Lymphatic: 6.0 x 6.6 cm infrarenal fusiform abdominal aortic aneurysm identified with substantial mural thrombus. There is periaortic edema/hemorrhage tracking to the left with a relatively large hematoma lateral to the left psoas muscle measuring 16.3 x 5.6 x 8.9 cm. Aneurysm extends into the bifurcation with prominent atherosclerosis in the origin of each common iliac artery. There is no gastrohepatic or hepatoduodenal ligament lymphadenopathy. No intraperitoneal or retroperitoneal lymphadenopathy. No pelvic sidewall lymphadenopathy. Reproductive: The prostate gland and seminal vesicles have normal imaging features. Other: No intraperitoneal free fluid. Musculoskeletal: Bone windows reveal no worrisome lytic or sclerotic osseous lesions. Gas in the subcutaneous fat of the anterior abdominal wall bilaterally is presumably secondary to injections. IMPRESSION: 1. Fusiform infrarenal abdominal aortic aneurysm  measures up to 6.6 cm and maximum diameter. There is Peri aneurysm all edema/hemorrhage with a large left-sided retroperitoneal hematoma lateral to the left psoas muscle in tracking down into the left pelvic sidewall. Imaging features are compatible with aortic aneurysm rupture. 2. Probable infarct posterior spleen with small subcapsular hematoma. 3. No substantial intraperitoneal free fluid. 4. Small low-density renal lesions bilaterally, likely cysts. 5. 13 mm left adrenal nodule cannot be further characterized on this study. Critical Value/emergent results were called by me at the time of interpretation on 07/01/2017 at 3:57 pm to Dr. Irine Seal , who verbally acknowledged these results. Electronically Signed   By: Misty Stanley M.D.   On: 07/01/2017 16:01   Dg Chest Port 1 View  Result Date: 07/02/2017 CLINICAL DATA:  Endotracheal and central line placements. EXAM: PORTABLE CHEST 1 VIEW COMPARISON:  06/28/2017 FINDINGS: Endotracheal tube placed with tip measuring 4.4 cm above the carina. Right central venous catheter with tip over the low SVC region. No pneumothorax. Shallow inspiration with atelectasis in the lung bases, increasing since prior study. Small bilateral pleural effusions. Probable emphysematous changes and chronic fibrosis in the lungs. Cardiac enlargement without vascular congestion. No edema or consolidation. IMPRESSION: Appliances appear in satisfactory position. Shallow inspiration with increasing atelectasis in the lung bases. Small bilateral pleural effusions. Chronic emphysematous changes and fibrosis in the lungs. Cardiac enlargement. Electronically Signed   By: Lucienne Capers M.D.   On: 07/02/2017 00:42      Assessment/Plan:  INTERVAL HISTORY: sp VVSurgery   Principal Problem:   Ruptured abdominal aortic aneurysm (AAA) (HCC) Active Problems:   Chest pain   CAP (community acquired pneumonia): Clinical   Acute renal failure superimposed on stage 3 chronic kidney  disease (HCC)   Dehydration   Leukocytosis   Elevated troponin   Dizziness   Vertigo   Acute pericarditis: Probable   Bacteremia due to  Streptococcus   CVA (cerebral vascular accident) ([place])   LLQ abdominal pain   Hypotension   Hemorrhagic shock ([place])   Acute blood loss anemia    Axyl Elison is a 62 y.o. male with multiple medical problems now with pneumococcal bacteremia, pericarditis pneumonia, infarcts in the CNS and a ruptured abdominal aortic aneurysm.  He was taken to the operating room last night and underwent  Endovascular repair of ruptured abdominal aortic aneurysm. Bilateral ultrasound-guided common femoral artery access Bilateral iliofemoral endarterectomy  Bilateral iliac, femoral, superficial femoral, and popliteal artery thromboembolectomy  Keep him on CNS dosing with ceftriaxone 2 g IV every 12 hours.  Cardiology holding off on transesophageal echocardiogram at this point which is not unreasonable given his clinical status.   LOS: 4 days   Alcide Evener 07/02/2017, 10:41 AM

## 2017-07-02 NOTE — Anesthesia Postprocedure Evaluation (Signed)
Anesthesia Post Note  Patient: Thomas [name]  Procedure(s) Performed: ENDOVASCULAR STENT GRAFT INSERTION (N/A ) Bilateral IlioFemoral Thrombectomies (Bilateral )     Patient location during evaluation: SICU Anesthesia Type: General Level of consciousness: sedated Pain management: pain level controlled Vital Signs Assessment: post-procedure vital signs reviewed and stable Respiratory status: patient remains intubated per anesthesia plan Cardiovascular status: stable Postop Assessment: no apparent nausea or vomiting Anesthetic complications: no    Last Vitals:  Vitals:   07/02/17 0700 07/02/17 0737  BP: 99/66 (!) 111/56  Pulse: 61 63  Resp: (!) 0 (!) 24  Temp:    SpO2: 100% 100%    Last Pain:  Vitals:   07/02/17 0400  TempSrc: Axillary  PainSc:                  Thomas [name] DANIEL

## 2017-07-03 ENCOUNTER — Inpatient Hospital Stay (HOSPITAL_COMMUNITY): Payer: Private Health Insurance - Indemnity

## 2017-07-03 DIAGNOSIS — Z8679 Personal history of other diseases of the circulatory system: Secondary | ICD-10-CM

## 2017-07-03 DIAGNOSIS — J9601 Acute respiratory failure with hypoxia: Secondary | ICD-10-CM

## 2017-07-03 DIAGNOSIS — A403 Sepsis due to Streptococcus pneumoniae: Secondary | ICD-10-CM

## 2017-07-03 DIAGNOSIS — I9589 Other hypotension: Secondary | ICD-10-CM

## 2017-07-03 DIAGNOSIS — D62 Acute posthemorrhagic anemia: Secondary | ICD-10-CM

## 2017-07-03 DIAGNOSIS — I309 Acute pericarditis, unspecified: Secondary | ICD-10-CM

## 2017-07-03 DIAGNOSIS — B955 Unspecified streptococcus as the cause of diseases classified elsewhere: Secondary | ICD-10-CM

## 2017-07-03 DIAGNOSIS — I63342 Cerebral infarction due to thrombosis of left cerebellar artery: Secondary | ICD-10-CM

## 2017-07-03 DIAGNOSIS — E861 Hypovolemia: Secondary | ICD-10-CM

## 2017-07-03 LAB — BASIC METABOLIC PANEL
ANION GAP: 11 (ref 5–15)
Anion gap: 8 (ref 5–15)
BUN: 30 mg/dL — ABNORMAL HIGH (ref 6–20)
BUN: 22 mg/dL — AB (ref 6–20)
CO2: 28 mmol/L (ref 22–32)
CO2: 25 mmol/L (ref 22–32)
Calcium: 6.8 mg/dL — ABNORMAL LOW (ref 8.9–10.3)
Calcium: 7.2 mg/dL — ABNORMAL LOW (ref 8.9–10.3)
Chloride: 100 mmol/L — ABNORMAL LOW (ref 101–111)
Chloride: 102 mmol/L (ref 101–111)
Creatinine, Ser: 1.32 mg/dL — ABNORMAL HIGH (ref 0.61–1.24)
Creatinine, Ser: 1.23 mg/dL (ref 0.61–1.24)
GFR calc Af Amer: >60 mL/min (ref >60)
GFR calc Af Amer: >60 mL/min (ref >60)
GFR calc non Af Amer: 56 mL/min — ABNORMAL LOW (ref >60)
GFR calc non Af Amer: >60 mL/min (ref >60)
GLUCOSE: 173 mg/dL — AB (ref 65–99)
Glucose, Bld: 144 mg/dL — ABNORMAL HIGH (ref 65–99)
POTASSIUM: 3.1 mmol/L — AB (ref 3.5–5.1)
Potassium: 3.4 mmol/L — ABNORMAL LOW (ref 3.5–5.1)
Sodium: 136 mmol/L (ref 135–145)
Sodium: 138 mmol/L (ref 135–145)

## 2017-07-03 LAB — CBC WITH DIFFERENTIAL/PLATELET
BASOS PCT: 0 %
Basophils Absolute: 0 10*3/uL (ref 0.0–0.1)
EOS ABS: 0 10*3/uL (ref 0.0–0.7)
Eosinophils Relative: 0 %
HCT: 35.5 % — ABNORMAL LOW (ref 39.0–52.0)
Hemoglobin: 10.3 g/dL — ABNORMAL LOW (ref 13.0–17.0)
LYMPHS PCT: 12 %
Lymphs Abs: 1.9 10*3/uL (ref 0.7–4.0)
MCH: 25.7 pg — AB (ref 26.0–34.0)
MCHC: 29 g/dL — AB (ref 30.0–36.0)
MCV: 88.5 fL (ref 78.0–100.0)
Monocytes Absolute: 1.3 10*3/uL — ABNORMAL HIGH (ref 0.1–1.0)
Monocytes Relative: 8 %
NEUTROS ABS: 12.5 10*3/uL — AB (ref 1.7–7.7)
Neutrophils Relative %: 80 %
PLATELETS: 204 10*3/uL (ref 150–400)
RBC: 4.01 MIL/uL — ABNORMAL LOW (ref 4.22–5.81)
RDW: 26.1 % — ABNORMAL HIGH (ref 11.5–15.5)
WBC: 15.7 10*3/uL — ABNORMAL HIGH (ref 4.0–10.5)

## 2017-07-03 LAB — CBC
HEMATOCRIT: 22.6 % — AB (ref 39.0–52.0)
Hemoglobin: 7.7 g/dL — ABNORMAL LOW (ref 13.0–17.0)
MCH: 25.3 pg — ABNORMAL LOW (ref 26.0–34.0)
MCHC: 34.1 g/dL (ref 30.0–36.0)
MCV: 74.3 fL — ABNORMAL LOW (ref 78.0–100.0)
PLATELETS: 155 10*3/uL (ref 150–400)
RBC: 3.04 MIL/uL — ABNORMAL LOW (ref 4.22–5.81)
RDW: 14.1 % (ref 11.5–15.5)
WBC: 15.8 10*3/uL — AB (ref 4.0–10.5)

## 2017-07-03 LAB — GLUCOSE, CAPILLARY
GLUCOSE-CAPILLARY: 151 mg/dL — AB (ref 65–99)
GLUCOSE-CAPILLARY: 161 mg/dL — AB (ref 65–99)
Glucose-Capillary: 144 mg/dL — ABNORMAL HIGH (ref 65–99)
Glucose-Capillary: 157 mg/dL — ABNORMAL HIGH (ref 65–99)

## 2017-07-03 LAB — CK
Total CK: 6014 U/L — ABNORMAL HIGH (ref 49–397)
Total CK: 5378 U/L — ABNORMAL HIGH (ref 49–397)
Total CK: 5080 U/L — ABNORMAL HIGH (ref 49–397)

## 2017-07-03 LAB — PREPARE RBC (CROSSMATCH)

## 2017-07-03 LAB — PHOSPHORUS: Phosphorus: 1.6 mg/dL — ABNORMAL LOW (ref 2.5–4.6)

## 2017-07-03 LAB — PROCALCITONIN: PROCALCITONIN: 17.42 ng/mL

## 2017-07-03 LAB — MAGNESIUM: Magnesium: 2.2 mg/dL (ref 1.7–2.4)

## 2017-07-03 MED ORDER — POTASSIUM PHOSPHATES 15 MMOLE/5ML IV SOLN
30.0000 mmol | Freq: Once | INTRAVENOUS | Status: AC
  Administered 2017-07-03: 30 mmol via INTRAVENOUS
  Filled 2017-07-03: qty 10

## 2017-07-03 MED ORDER — CHLORHEXIDINE GLUCONATE CLOTH 2 % EX PADS
6.0000 | MEDICATED_PAD | Freq: Every day | CUTANEOUS | Status: DC
  Administered 2017-07-03 – 2017-07-07 (×4): 6 via TOPICAL

## 2017-07-03 MED ORDER — IOPAMIDOL (ISOVUE-370) INJECTION 76%
100.0000 mL | Freq: Once | INTRAVENOUS | Status: AC | PRN
  Administered 2017-07-03: 75 mL via INTRAVENOUS

## 2017-07-03 MED ORDER — SODIUM CHLORIDE 0.9 % IV SOLN
1.0000 g | Freq: Once | INTRAVENOUS | Status: AC
  Administered 2017-07-03: 1 g via INTRAVENOUS
  Filled 2017-07-03: qty 10

## 2017-07-03 MED ORDER — DEXTROSE-NACL 5-0.45 % IV SOLN
INTRAVENOUS | Status: DC
  Administered 2017-07-03: 17:00:00 via INTRAVENOUS
  Administered 2017-07-04: 75 mL/h via INTRAVENOUS

## 2017-07-03 MED ORDER — SODIUM CHLORIDE 0.9 % IV SOLN: Freq: Once | INTRAVENOUS | Status: AC

## 2017-07-03 MED ORDER — IOPAMIDOL (ISOVUE-370) INJECTION 76%
INTRAVENOUS | Status: AC
  Filled 2017-07-03: qty 100

## 2017-07-03 MED ORDER — SODIUM CHLORIDE 0.9% FLUSH: 10.0000 mL | INTRAVENOUS | Status: DC | PRN

## 2017-07-03 NOTE — Progress Notes (Signed)
Progress Note  Patient Name: Thomas [name] Date of Encounter: 07/03/2017  Primary Cardiologist: Sanda Klein, MD   Subjective   Awake having some abd pain.  Had AAA repair endovascular Sunday night. No SOB.    Inpatient Medications    Scheduled Meds: . atorvastatin  40 mg Oral q1800  . fentaNYL (SUBLIMAZE) injection  50 mcg Intravenous Once  . hydrocortisone sod succinate (SOLU-CORTEF) inj  50 mg Intravenous Q6H  . insulin aspart  1-3 Units Subcutaneous Q6H  . pantoprazole (PROTONIX) IV  40 mg Intravenous Daily   Continuous Infusions: . sodium chloride    . cefTRIAXone (ROCEPHIN)  IV Stopped (07/03/17 0410)  . fentaNYL infusion INTRAVENOUS Stopped (07/02/17 1143)  . phenylephrine (NEO-SYNEPHRINE) Adult infusion Stopped (07/02/17 1143)  . potassium PHOSPHATE IVPB (mmol) 30 mmol (07/03/17 0737)  .  sodium bicarbonate (isotonic) infusion in sterile water 125 mL/hr at 07/03/17 0700   PRN Meds: acetaminophen (TYLENOL) oral liquid 160 mg/5 mL **OR** acetaminophen, fentaNYL, iopamidol, midazolam, midazolam, ondansetron (ZOFRAN) IV, simethicone   Vital Signs    Vitals:   07/03/17 0400 07/03/17 0500 07/03/17 0600 07/03/17 0700  BP: 120/66 117/79 120/74   Pulse: 77 83 77   Resp: 18 (!) 23 17   Temp: 98.6 F (37 C)   97.8 F (36.6 C)  TempSrc: Oral   Oral  SpO2: 95% 92% 93%   Weight:  143 lb 11.8 oz (65.2 kg)    Height:        Intake/Output Summary (Last 24 hours) at 07/03/2017 0842 Last data filed at 07/03/2017 0737 Gross per 24 hour  Intake 3520 ml  Output 1995 ml  Net 1525 ml   Filed Weights   07/02/17 0000 07/02/17 0500 07/03/17 0500  Weight: 143 lb 1.3 oz (64.9 kg) 143 lb 4.8 oz (65 kg) 143 lb 11.8 oz (65.2 kg)    Telemetry    NSR - Personally Reviewed  ECG    NSR RBBB - Personally Reviewed  Physical Exam   GEN: Well nourished, well developed, in no acute distress  HEENT: normal  Neck: no JVD, carotid bruits, or masses Cardiac: RRR; no murmurs, rubs, or  gallops,no edema  Respiratory:  clear to auscultation bilaterally, normal work of breathing GI: soft, mild abd tenderness,  Mild distended, + BS MS: no deformity or atrophy  Skin: warm and dry, no rash Neuro:  Alert and Oriented x 3, Strength and sensation are intact Psych: euthymic mood, full affect   Labs    Chemistry Recent Labs  Lab 07/01/17 1632  07/01/17 2256 07/01/17 2354 07/03/17 0300  NA 132*   < > 138 133* 136  K 4.0   < > 6.1* 5.4* 3.4*  CL 105  --   --  109 100*  CO2 16*  --   --  17* 28  GLUCOSE 117*  --   --  141* 144*  BUN 23*  --   --  22* 30*  CREATININE 1.47*  --   --  1.41* 1.32*  CALCIUM 7.9*  --   --  7.1* 6.8*  PROT 6.4*  --   --   --   --   ALBUMIN 2.4*  --   --   --   --   AST 40  --   --   --   --   ALT 27  --   --   --   --   ALKPHOS 81  --   --   --   --  BILITOT 1.2  --   --   --   --   GFRNONAA 49*  --   --  52* 56*  GFRAA 57*  --   --  60* >60  ANIONGAP 11  --   --  7 8   < > = values in this interval not displayed.     Hematology Recent Labs  Lab 07/02/17 0612 07/02/17 1555 07/03/17 0300  WBC 16.4* 17.4* 15.8*  RBC 3.63* 3.44* 3.04*  HGB 9.1* 8.6* 7.7*  HCT 27.4* 25.8* 22.6*  MCV 75.5* 75.0* 74.3*  MCH 25.1* 25.0* 25.3*  MCHC 33.2 33.3 34.1  RDW 14.4 14.3 14.1  PLT 147* 147* 155    Cardiac Enzymes Recent Labs  Lab 06/28/17 1544 06/28/17 2120 06/29/17 0320 06/29/17 0904  TROPONINI 0.34* 0.45* 0.41* 0.34*    Recent Labs  Lab 06/28/17 1552  TROPIPOC 0.33*     BNPNo results for input(s): BNP, PROBNP in the last 168 hours.   DDimer No results for input(s): DDIMER in the last 168 hours.   Radiology    Ct Abdomen Pelvis W Contrast  Result Date: 07/01/2017 CLINICAL DATA:  Left lower quadrant pain. EXAM: CT ABDOMEN AND PELVIS WITH CONTRAST TECHNIQUE: Multidetector CT imaging of the abdomen and pelvis was performed using the standard protocol following bolus administration of intravenous contrast. CONTRAST:  31mL  ISOVUE-300 IOPAMIDOL (ISOVUE-300) INJECTION 61% COMPARISON:  None. FINDINGS: Lower chest: Chronic interstitial changes noted in the peripheral lung bases bilaterally. Hepatobiliary: No focal abnormality within the liver parenchyma. There is no evidence for gallstones, gallbladder wall thickening, or pericholecystic fluid. No intrahepatic or extrahepatic biliary dilation. Pancreas: No focal mass lesion. No dilatation of the main duct. No intraparenchymal cyst. No peripancreatic edema. Spleen: Small subcapsular fluid collection in the spleen suggests a hematoma. Small defect in the subcapsular aspect of the posterior spleen (image 27/3) is compatible with a small infarct. Adrenals/Urinary Tract: Right adrenal gland unremarkable. 13 mm left adrenal nodule cannot be definitively characterized. Small hypodense lesions in each kidney are compatible with cysts. Cortical scarring noted in the upper pole of the right kidney. No evidence for hydroureter. Small bilateral bladder diverticuli suggests underlying component of bladder outlet obstruction. Stomach/Bowel: Stomach is nondistended. No gastric wall thickening. No evidence of outlet obstruction. Duodenum is normally positioned as is the ligament of Treitz. No small bowel wall thickening. No small bowel dilatation. The terminal ileum is normal. Vascular/Lymphatic: 6.0 x 6.6 cm infrarenal fusiform abdominal aortic aneurysm identified with substantial mural thrombus. There is periaortic edema/hemorrhage tracking to the left with a relatively large hematoma lateral to the left psoas muscle measuring 16.3 x 5.6 x 8.9 cm. Aneurysm extends into the bifurcation with prominent atherosclerosis in the origin of each common iliac artery. There is no gastrohepatic or hepatoduodenal ligament lymphadenopathy. No intraperitoneal or retroperitoneal lymphadenopathy. No pelvic sidewall lymphadenopathy. Reproductive: The prostate gland and seminal vesicles have normal imaging features.  Other: No intraperitoneal free fluid. Musculoskeletal: Bone windows reveal no worrisome lytic or sclerotic osseous lesions. Gas in the subcutaneous fat of the anterior abdominal wall bilaterally is presumably secondary to injections. IMPRESSION: 1. Fusiform infrarenal abdominal aortic aneurysm measures up to 6.6 cm and maximum diameter. There is Peri aneurysm all edema/hemorrhage with a large left-sided retroperitoneal hematoma lateral to the left psoas muscle in tracking down into the left pelvic sidewall. Imaging features are compatible with aortic aneurysm rupture. 2. Probable infarct posterior spleen with small subcapsular hematoma. 3. No substantial intraperitoneal free fluid. 4. Small  low-density renal lesions bilaterally, likely cysts. 5. 13 mm left adrenal nodule cannot be further characterized on this study. Critical Value/emergent results were called by me at the time of interpretation on 07/01/2017 at 3:57 pm to Dr. Irine Seal , who verbally acknowledged these results. Electronically Signed   By: Misty Stanley M.D.   On: 07/01/2017 16:01   Dg Chest Port 1 View  Result Date: 07/02/2017 CLINICAL DATA:  Endotracheal and central line placements. EXAM: PORTABLE CHEST 1 VIEW COMPARISON:  06/28/2017 FINDINGS: Endotracheal tube placed with tip measuring 4.4 cm above the carina. Right central venous catheter with tip over the low SVC region. No pneumothorax. Shallow inspiration with atelectasis in the lung bases, increasing since prior study. Small bilateral pleural effusions. Probable emphysematous changes and chronic fibrosis in the lungs. Cardiac enlargement without vascular congestion. No edema or consolidation. IMPRESSION: Appliances appear in satisfactory position. Shallow inspiration with increasing atelectasis in the lung bases. Small bilateral pleural effusions. Chronic emphysematous changes and fibrosis in the lungs. Cardiac enlargement. Electronically Signed   By: Lucienne Capers M.D.   On:  07/02/2017 00:42    Cardiac Studies   - Left ventricle: The cavity size was normal. There was moderate   focal basal hypertrophy. Systolic function was normal. The   estimated ejection fraction was in the range of 60% to 65%. Wall   motion was normal; there were no regional wall motion   abnormalities. The study is not technically sufficient to allow   evaluation of LV diastolic function. - Aortic valve: Trileaflet; mildly thickened, mildly calcified   leaflets. There was mild regurgitation.  Patient Profile     62 y.o. male with stroke, strep pneumoniae, AAA endovascular repair 07/01/17, elevated mild flat troponin, anemia.   Assessment & Plan    Elevated troponin  - this represents demand ischemia in the setting of his underlying illness (AAA, bacteremia). Does not have rise and fall that would be more indicative of ACS.   Anemia  - 8.6 to 7.7. Getting 2 units.   AAA repair  - now having ABD discomfort  - getting CT of abd.   CK elevated  - 6000  Consider DC bicarb IV drip and transition to NS.   S. Pneum bacteremia with CVA  - continue ABX IV  - TEE again on hold. Will reassess tomorrow.    For questions or updates, please contact [facility] Please consult [email or web address] for contact info under Cardiology/STEMI.      Signed, Candee Furbish, MD  07/03/2017, 8:42 AM

## 2017-07-03 NOTE — Progress Notes (Signed)
I have reviewed the patient's CT angiogram from earlier today.  This was done because the patient had abdominal pain with distention as well as a drop in his hemoglobin.  He is also complaining of numbness in his right leg.  The patient has residual thrombus within the right limb of his graft causing significant narrowing however, the patient has faintly palpable pedal pulses bilaterally and very brisk/triphasic Doppler signals in bilateral dorsalis pedis and posterior tibial arteries.  I do not feel that the patient's right leg symptoms are related to vascular insufficiency given his vascular exam in the right leg.  I am concerned that he is at risk for thrombosis of the right limb of his graft because of the narrowing created by the thrombus within the right limb.  However, because of his multiple medical issues I would not recommend going back to the operating room at this time to try and remove the thrombus.  I feel that he would be at risk for further pulmonary compromise, and in addition manipulation of the graft could cause migration or separation of the pieces.  Annamarie Major

## 2017-07-03 NOTE — Progress Notes (Signed)
STROKE TEAM PROGRESS NOTE   SUBJECTIVE (INTERVAL HISTORY) No family members present. Pt is getting blood transfusion. Extubated yesterday and now on room air tolerating well. Conversing well. Stated that his belly is tight, tenderness on palpation at LLQ. Right LE numbness and weakness since yesterday afternoon and now getting worse. CTA aorta pending but seems to have right internal illiac artery in-stent stenosis.   OBJECTIVE Temp:  [97.8 F (36.6 C)-98.6 F (37 C)] 98.4 F (36.9 C) (04/09 1100) Pulse Rate:  [72-92] 79 (04/09 1100) Cardiac Rhythm: Normal sinus rhythm (04/09 0748) Resp:  [11-25] 21 (04/09 1100) BP: (95-147)/(65-87) 119/81 (04/09 1100) SpO2:  [88 %-98 %] 94 % (04/09 1100) Weight:  [143 lb 11.8 oz (65.2 kg)] 143 lb 11.8 oz (65.2 kg) (04/09 0500)  CBC:  Recent Labs  Lab 07/01/17 0654 07/01/17 1632  07/02/17 1555 07/03/17 0300  WBC 19.4* 22.8*   < > 17.4* 15.8*  NEUTROABS 14.7* 19.9*  --   --   --   HGB 12.9* 12.4*   < > 8.6* 7.7*  HCT 37.7* 36.4*   < > 25.8* 22.6*  MCV 74.8* 75.1*   < > 75.0* 74.3*  PLT 138* 155   < > 147* 155   < > = values in this interval not displayed.    Basic Metabolic Panel:  Recent Labs  Lab 07/01/17 2354 07/02/17 0012 07/03/17 0300  NA 133*  --  136  K 5.4*  --  3.4*  CL 109  --  100*  CO2 17*  --  28  GLUCOSE 141*  --  144*  BUN 22*  --  30*  CREATININE 1.41*  --  1.32*  CALCIUM 7.1*  --  6.8*  MG  --  1.9 2.2  PHOS  --  4.1 1.6*    Lipid Panel:     Component Value Date/Time   CHOL 128 06/29/2017 0320   TRIG 97 06/29/2017 0320   HDL 37 (L) 06/29/2017 0320   CHOLHDL 3.5 06/29/2017 0320   VLDL 19 06/29/2017 0320   LDLCALC 72 06/29/2017 0320   HgbA1c:  Lab Results  Component Value Date   HGBA1C 5.6 06/29/2017   Urine Drug Screen: No results found for: LABOPIA, COCAINSCRNUR, LABBENZ, AMPHETMU, THCU, LABBARB  Alcohol Level No results found for: [place] I have personally reviewed the radiological images  below and agree with the radiology interpretations.  Mr Brain Wo Contrast 06/28/2017 IMPRESSION:  1. Three acute nonhemorrhagic small LEFT cerebellar infarcts.  2. Old small bilateral cerebellar infarcts. Old basal ganglia and thalamus infarcts.   Mr Jodene Nam Head Wo Contrast 06/30/2017 IMPRESSION:  Occlusion of the right M1 segment.  Severe stenosis distal right vertebral artery.  Mild to moderate stenosis in the posterior cerebral artery bilaterally.   Mr Jodene Nam Neck W Wo Contrast 06/30/2017 IMPRESSION:  Negative for carotid stenosis in the neck  Dominant left vertebral artery widely patent.  Small right vertebral artery with moderate to severe stenosis proximally and distally.  Mr Jeri Cos Contrast 06/30/2017 IMPRESSION:  Normal enhancement postcontrast infusion. Small acute infarcts left cerebellum do not enhance.   Transthoracic Echocardiogram  06/30/2017 Study Conclusions - Left ventricle: The cavity size was normal. There was moderate   focal basal hypertrophy. Systolic function was normal. The   estimated ejection fraction was in the range of 60% to 65%. Wall   motion was normal; there were no regional wall motion   abnormalities. The study is not technically sufficient to allow  evaluation of LV diastolic function. - Aortic valve: Trileaflet; mildly thickened, mildly calcified   leaflets. There was mild regurgitation.  Ct Abdomen Pelvis W Contrast 07/01/2017 IMPRESSION: 1. Fusiform infrarenal abdominal aortic aneurysm measures up to 6.6 cm and maximum diameter. There is Peri aneurysm all edema/hemorrhage with a large left-sided retroperitoneal hematoma lateral to the left psoas muscle in tracking down into the left pelvic sidewall. Imaging features are compatible with aortic aneurysm rupture. 2. Probable infarct posterior spleen with small subcapsular hematoma. 3. No substantial intraperitoneal free fluid. 4. Small low-density renal lesions bilaterally, likely cysts. 5. 13 mm left  adrenal nodule cannot be further characterized on this study.  Dg Chest Port 1 View  07/02/2017 IMPRESSION: Appliances appear in satisfactory position. Shallow inspiration with increasing atelectasis in the lung bases. Small bilateral pleural effusions. Chronic emphysematous changes and fibrosis in the lungs. Cardiac enlargement.   TEE pending  CTA aortic artery - pending   PHYSICAL EXAM  Temp:  [97.8 F (36.6 C)-98.6 F (37 C)] 98.4 F (36.9 C) (04/09 1100) Pulse Rate:  [72-92] 79 (04/09 1100) Resp:  [11-25] 21 (04/09 1100) BP: (95-147)/(65-87) 119/81 (04/09 1100) SpO2:  [88 %-98 %] 94 % (04/09 1100) Weight:  [143 lb 11.8 oz (65.2 kg)] 143 lb 11.8 oz (65.2 kg) (04/09 0500)  General - Well nourished, well developed, in no apparent distress.  Ophthalmologic - Fundi not visualized due to small pupils.  Cardiovascular - Regular rate and rhythm.  Abdomen - tight, tenderness on palpation at LLQ  Mental Status -  Level of arousal and orientation to time, place, and person were intact. Language including expression, naming, repetition, comprehension was assessed and found intact. Fund of Knowledge was assessed and was intact.  Cranial Nerves II - XII - II - Visual field intact OU. III, IV, VI - Extraocular movements intact. V - Facial sensation intact bilaterally. VII - Facial movement intact bilaterally. VIII - Hearing & vestibular intact bilaterally. X - Palate elevates symmetrically. XI - Chin turning & shoulder shrug intact bilaterally. XII - Tongue protrusion intact.  Motor Strength - The patient's strength was normal in BUEs, however, BLE proximal 4-/5 due to pain at b/l groin area, distally LLE 5/5 and RLE 4/5 DF/PF and pronator drift was absent.  Bulk was normal and fasciculations were absent.   Motor Tone - Muscle tone was assessed at the neck and appendages and was normal.  Reflexes - The patient's reflexes were 2+ in all extremities and he had no pathological  reflexes.  Sensory - Light touch, temperature/pinprick were decreased at LLE, sensory level at T12.    Coordination - The patient had normal movements in the hands with no ataxia or dysmetria.  Tremor was absent.  Gait and Station - not tested   ASSESSMENT/PLAN Thomas [name] is a 62 y.o. male with history of hypertension, history of tobacco use and previous strokes by imaging presenting with weakness, chills, dizziness, cough, left-sided chest pain, abdominal pain, nausea and vomiting. He did not receive IV t-PA due to late presentation.  New onset RLE weakness and numbness   Started 07/02/17 pm - gradually worsening but tolerable  CTA aortic artery pending but seems to have in stent stenosis at internal illiac artery  However, distal pulse dopplerable and right foot warm  Sensory level at T12, below the level decreased sensation in all modality. DTR 2+ and no babinski. Unlikely related to stroke, spinal cord or peripheral nerve injury  Discussed with Dr. Trula Slade, may consider further  surgical exploration.  Stroke: 3 acute LEFT cerebellar infarcts possibly septic emboli vs. hypotension in the setting of vascular stenosis.   MRI head with and without contrast - Three acute nonhemorrhagic small LEFT cerebellar infarcts. Old small bilateral cerebellar infarcts. Old basal ganglia and thalamus infarcts.   MRA H&N - right M1 occlusion, likely chronic.  Bilateral PCA stenosis.  Terminal right vertebral artery stenosis.  2D Echo - EF 60-65%. Cardiac source of emboli identified.  LDL - 72  HgbA1c - 5.6  TEE pending  VTE prophylaxis -subcutaneous heparin Diet NPO time specified  No antithrombotic prior to admission, now on no antithrombotics due to anemia, AAA rupture and retroperitoneal bleeding  Patient counseled to be compliant with his antithrombotic medications  Ongoing aggressive stroke risk factor management  Therapy recommendations: pending  Disposition:  Pending  AAA  rupture with large left retroperitoneal hematoma  Likely to explain hypotension and anemia  S/p emergent endovascular repair of ruptured AAA, bilateral iliofemoral endarterectomy, BLE thromboembolectomies   New onset RLE numbness and weakness  CTA aorta pending   Off neo  VVS on board  Possible pericarditis and endocarditis  EKG concerning for pericarditis  Elevated troponin 0.34->0.45->0.41->0.34  Elevated WBC 22.8->19.1->16.4  Blood culture positive strep peumo  ID on board - on rocephin  Cardiology on board  TTE EF 60-65% no vegetation seen  TEE pending  PVD  Hx of smoking  s/p endovascular repair of ruptured AAA, bilateral iliofemoral endarterectomy, BLE thromboembolectomies   VVS on board  Intracranial vascular occlusion / stenosis  MRA H&N - right M1 occlusion, likely chronic. Bilateral PCA stenosis. Terminal right vertebral artery stenosis.  Risk factor modification   Avoid hypotension   Hypotension   Multifactorial  Likely due to AAA rupture, blood loss, sepsis  On neo  BP stable  Taper off neo as able  BP goal 110-140 due to right M1 occlusion but also AAA rupture.   Anemia   Likely due to blood loss, sepsis, endocarditis  CT large left retroperitoneal hematoma  Hb 12.9->9.9->9.1  Close monitoring  PRBC if needed.  Hyperlipidemia  Lipid lowering medication PTA: none  LDL 72, goal < 70  Current lipid lowering medication: Lipitor 40 mg daily  Continue statin at discharge  Tobacco abuse  Current smoker  Smoking cessation counseling provided  Pt is willing to quit  Other Stroke Risk Factors  Advanced age  Hx stroke/TIA on imaging  Other Active Problems  Interstitial lung disease possibly chronic by CXR    Hospital day # 5  This patient is critically ill due to AAA rupture, sepsis, spetic shock, stroke, left M1 occlusion, anemia, hypotension and at significant risk of neurological worsening, death form  recurrent stroke, heart failure, septic shock, anemia, hemorrhage. This patient's care requires constant monitoring of vital signs, hemodynamics, respiratory and cardiac monitoring, review of multiple databases, neurological assessment, discussion with family, other specialists and medical decision making of high complexity. I also discussed with Dr. Trula Slade at bedside. I spent 45 minutes of neurocritical care time in the care of this patient.  Rosalin Hawking, MD PhD Stroke Neurology 07/03/2017 11:13 AM    To contact Stroke Continuity provider, please refer to [email or web address] After hours, contact General Neurology

## 2017-07-03 NOTE — Progress Notes (Signed)
[place] Progress Note Patient Name: Thomas [name] DOB: 03/24/1956 MRN: [id number]   Date of Service  07/03/2017  HPI/Events of Note  Multiple issues: 1. K+ = 3.4, PO4--- = 1.6 and Creatinine 1.34 and 2. Abdomen distended.   eICU Interventions  Will order: 1. Replace K+ and PO4---. 2. NPO 3. NGT to LIS.  4. Notify vascular surgery about Hgb = 7.7 and abdominal distention.      Intervention Category Major Interventions: Electrolyte abnormality - evaluation and management  Sommer,Steven Eugene 07/03/2017, 5:58 AM

## 2017-07-03 NOTE — Progress Notes (Signed)
Subjective:  Complaining of abdominal pain and distention Antibiotics:  Anti-infectives (From admission, onward)   Start     Dose/Rate Route Frequency Ordered Stop   06/30/17 1600  cefTRIAXone (ROCEPHIN) 2 g in sodium chloride 0.9 % 100 mL IVPB     2 g 200 mL/hr over 30 Minutes Intravenous Every 12 hours 06/30/17 1354     06/29/17 2200  azithromycin (ZITHROMAX) tablet 500 mg  Status:  Discontinued     500 mg Oral Daily at bedtime 06/29/17 1159 06/29/17 1454   06/29/17 1500  cefTRIAXone (ROCEPHIN) 2 g in sodium chloride 0.9 % 100 mL IVPB  Status:  Discontinued     2 g 200 mL/hr over 30 Minutes Intravenous Every 24 hours 06/29/17 1454 06/30/17 1354   06/28/17 2000  cefTRIAXone (ROCEPHIN) 1 g in sodium chloride 0.9 % 100 mL IVPB  Status:  Discontinued     1 g 200 mL/hr over 30 Minutes Intravenous Every 24 hours 06/28/17 1839 06/29/17 1454   06/28/17 2000  azithromycin (ZITHROMAX) 500 mg in sodium chloride 0.9 % 250 mL IVPB  Status:  Discontinued     500 mg 250 mL/hr over 60 Minutes Intravenous Every 24 hours 06/28/17 1839 06/29/17 1159      Medications: Scheduled Meds: . atorvastatin  40 mg Oral q1800  . fentaNYL (SUBLIMAZE) injection  50 mcg Intravenous Once  . hydrocortisone sod succinate (SOLU-CORTEF) inj  50 mg Intravenous Q6H  . insulin aspart  1-3 Units Subcutaneous Q6H  . iopamidol      . pantoprazole (PROTONIX) IV  40 mg Intravenous Daily   Continuous Infusions: . sodium chloride    . cefTRIAXone (ROCEPHIN)  IV Stopped (07/03/17 0410)  . dextrose 5 % and 0.45% NaCl    . phenylephrine (NEO-SYNEPHRINE) Adult infusion Stopped (07/02/17 1143)  . potassium PHOSPHATE IVPB (mmol) 30 mmol (07/03/17 0737)   PRN Meds:.acetaminophen (TYLENOL) oral liquid 160 mg/5 mL **OR** acetaminophen, iopamidol, iopamidol, ondansetron (ZOFRAN) IV, simethicone    Objective: Weight change: 10.6 oz (0.3 kg)  Intake/Output Summary (Last 24 hours) at 07/03/2017 0951 Last data filed  at 07/03/2017 0737 Gross per 24 hour  Intake 3520 ml  Output 1995 ml  Net 1525 ml   Blood pressure 138/77, pulse 79, temperature 97.8 F (36.6 C), temperature source Oral, resp. rate (!) 25, height 5\' 5"  (1.651 m), weight 143 lb 11.8 oz (65.2 kg), SpO2 96 %. Temp:  [97.8 F (36.6 C)-98.6 F (37 C)] 97.8 F (36.6 C) (04/09 0700) Pulse Rate:  [66-92] 79 (04/09 0800) Resp:  [7-25] 25 (04/09 0800) BP: (95-147)/(65-87) 138/77 (04/09 0800) SpO2:  [88 %-100 %] 96 % (04/09 0800) Arterial Line BP: (99-102)/(61-95) 102/61 (04/08 1100) Weight:  [143 lb 11.8 oz (65.2 kg)] 143 lb 11.8 oz (65.2 kg) (04/09 0500)  Physical Exam: General: Alert and awake, third and conversant HEENT: anicteric sclera, pupils reactive to light and accommodation, EOMI CVS regular rate, normal r,  no murmur rubs or gallops Chest: Fairly clear to auscultation anteriorly  abdomen: soft distended and diffusely tender normal bowel sounds, Extremities: no  clubbing or edema noted bilaterally Skin: Surgical wounds are clean Neuro: No new focal deficits  CBC:  CBC Latest Ref Rng & Units 07/03/2017 07/02/2017 07/02/2017  WBC 4.0 - 10.5 K/uL 15.8(H) 17.4(H) 16.4(H)  Hemoglobin 13.0 - 17.0 g/dL 7.7(L) 8.6(L) 9.1(L)  Hematocrit 39.0 - 52.0 % 22.6(L) 25.8(L) 27.4(L)  Platelets 150 - 400 K/uL 155 147(L) 147(L)  BMET Recent Labs    07/01/17 2354 07/03/17 0300  NA 133* 136  K 5.4* 3.4*  CL 109 100*  CO2 17* 28  GLUCOSE 141* 144*  BUN 22* 30*  CREATININE 1.41* 1.32*  CALCIUM 7.1* 6.8*     Liver Panel  Recent Labs    07/01/17 1632  PROT 6.4*  ALBUMIN 2.4*  AST 40  ALT 27  ALKPHOS 81  BILITOT 1.2       Sedimentation Rate No results for input(s): ESRSEDRATE in the last 72 hours. C-Reactive Protein No results for input(s): CRP in the last 72 hours.  Micro Results: Recent Results (from the past 720 hour(s))  Culture, blood (Routine X 2) w Reflex to ID Panel     Status: Abnormal   Collection Time:  06/28/17  9:10 PM  Result Value Ref Range Status   Specimen Description   Final    BLOOD RIGHT ANTECUBITAL Performed at [facility] [address], [place], [place] [address]    Special Requests   Final    BOTTLES DRAWN AEROBIC AND ANAEROBIC Blood Culture adequate volume Performed at [facility] [address], [place], [place] [address]    Culture  Setup Time   Final    GRAM POSITIVE COCCI IN CHAINS IN BOTH AEROBIC AND ANAEROBIC BOTTLES CRITICAL VALUE NOTED.  VALUE IS CONSISTENT WITH PREVIOUSLY REPORTED AND CALLED VALUE.    Culture (A)  Final    STREPTOCOCCUS PNEUMONIAE SUSCEPTIBILITIES PERFORMED ON PREVIOUS CULTURE WITHIN THE LAST 5 DAYS. Performed at [facility], [place] [address] [place], [place] [address]    Report Status 07/01/2017 FINAL  Final  Culture, blood (Routine X 2) w Reflex to ID Panel     Status: Abnormal   Collection Time: 06/28/17  9:10 PM  Result Value Ref Range Status   Specimen Description   Final    BLOOD LEFT HAND Performed at [facility] [address], [place], [place] [address]    Special Requests   Final    BOTTLES DRAWN AEROBIC AND ANAEROBIC Blood Culture adequate volume Performed at [facility] [address] [place], [place] [address]    Culture  Setup Time   Final    GRAM POSITIVE COCCI IN CHAINS IN BOTH AEROBIC AND ANAEROBIC BOTTLES CRITICAL RESULT CALLED TO, READ BACK BY AND VERIFIED WITH: D. Wofford PharmD 14:45 06/29/17 ([email or web address]) Performed at [facility], [place] [address] [place], [place] [address]    Culture STREPTOCOCCUS PNEUMONIAE (A)  Final   Report Status 07/01/2017 FINAL  Final   Organism ID, Bacteria STREPTOCOCCUS PNEUMONIAE  Final      Susceptibility   Streptococcus pneumoniae - MIC*    ERYTHROMYCIN <=0.12 SENSITIVE Sensitive     LEVOFLOXACIN 0.5 SENSITIVE Sensitive     PENICILLIN (meningitis) 0.25 RESISTANT Resistant     PENICILLIN  (non-meningitis) 0.25 SENSITIVE Sensitive     CEFTRIAXONE (non-meningitis) <=0.12 SENSITIVE Sensitive     CEFTRIAXONE (meningitis) <=0.12 SENSITIVE Sensitive     * STREPTOCOCCUS PNEUMONIAE  Blood Culture ID Panel (Reflexed)     Status: Abnormal   Collection Time: 06/28/17  9:10 PM  Result Value Ref Range Status   Enterococcus species NOT DETECTED NOT DETECTED Final   Listeria monocytogenes NOT DETECTED NOT DETECTED Final   Staphylococcus species NOT DETECTED NOT DETECTED Final   Staphylococcus aureus NOT DETECTED NOT DETECTED Final   Streptococcus species DETECTED (A) NOT DETECTED Final  Comment: CRITICAL RESULT CALLED TO, READ BACK BY AND VERIFIED WITH: D. Wofford PharmD 14:45 06/29/17 ([email or web address])    Streptococcus agalactiae NOT DETECTED NOT DETECTED Final   Streptococcus pneumoniae DETECTED (A) NOT DETECTED Final    Comment: CRITICAL RESULT CALLED TO, READ BACK BY AND VERIFIED WITH: D. Wofford PharmD 14:45 06/29/17 ([email or web address])    Streptococcus pyogenes NOT DETECTED NOT DETECTED Final   Acinetobacter baumannii NOT DETECTED NOT DETECTED Final   Enterobacteriaceae species NOT DETECTED NOT DETECTED Final   Enterobacter cloacae complex NOT DETECTED NOT DETECTED Final   Escherichia coli NOT DETECTED NOT DETECTED Final   Klebsiella oxytoca NOT DETECTED NOT DETECTED Final   Klebsiella pneumoniae NOT DETECTED NOT DETECTED Final   Proteus species NOT DETECTED NOT DETECTED Final   Serratia marcescens NOT DETECTED NOT DETECTED Final   Haemophilus influenzae NOT DETECTED NOT DETECTED Final   Neisseria meningitidis NOT DETECTED NOT DETECTED Final   Pseudomonas aeruginosa NOT DETECTED NOT DETECTED Final   Candida albicans NOT DETECTED NOT DETECTED Final   Candida glabrata NOT DETECTED NOT DETECTED Final   Candida krusei NOT DETECTED NOT DETECTED Final   Candida parapsilosis NOT DETECTED NOT DETECTED Final   Candida tropicalis NOT DETECTED NOT DETECTED Final    Comment: Performed at [facility], [place] [address], [place], [place] [address]  Culture, blood (routine x 2)     Status: None (Preliminary result)   Collection Time: 06/30/17  2:18 PM  Result Value Ref Range Status   Specimen Description BLOOD RIGHT ARM  Final   Special Requests   Final    BOTTLES DRAWN AEROBIC AND ANAEROBIC Blood Culture adequate volume   Culture   Final    NO GROWTH 2 DAYS Performed at [facility], [address], [place], [place] [address]    Report Status PENDING  Incomplete  Culture, blood (routine x 2)     Status: None (Preliminary result)   Collection Time: 06/30/17  2:23 PM  Result Value Ref Range Status   Specimen Description BLOOD RIGHT FOREARM  Final   Special Requests   Final    BOTTLES DRAWN AEROBIC AND ANAEROBIC Blood Culture adequate volume   Culture   Final    NO GROWTH 2 DAYS Performed at [facility], [place] [address], [place], [place] [address]    Report Status PENDING  Incomplete  MRSA PCR Screening     Status: None   Collection Time: 07/01/17 11:42 PM  Result Value Ref Range Status   MRSA by PCR NEGATIVE NEGATIVE Final    Comment:        The GeneXpert MRSA Assay (FDA approved for NASAL specimens only), is one component of a comprehensive MRSA colonization surveillance program. It is not intended to diagnose MRSA infection nor to guide or monitor treatment for MRSA infections. Performed at [facility], [place] [address], [place], [place] [address]   Culture, respiratory (NON-Expectorated)     Status: None (Preliminary result)   Collection Time: 07/02/17  4:45 AM  Result Value Ref Range Status   Specimen Description TRACHEAL ASPIRATE  Final   Special Requests NONE  Final   Gram Stain   Final    FEW WBC PRESENT, PREDOMINANTLY PMN FEW GRAM NEGATIVE RODS FEW GRAM POSITIVE COCCI IN PAIRS RARE GRAM NEGATIVE COCCOBACILLI Performed at [facility], [place] [address], [place], [place] [address]    Culture PENDING  Incomplete   Report Status  PENDING  Incomplete  Studies/Results: Ct Abdomen Pelvis W Contrast  Result Date: 07/01/2017 CLINICAL DATA:  Left lower quadrant pain. EXAM: CT ABDOMEN AND PELVIS WITH CONTRAST TECHNIQUE: Multidetector CT imaging of the abdomen and pelvis was performed using the standard protocol following bolus administration of intravenous contrast. CONTRAST:  7mL ISOVUE-300 IOPAMIDOL (ISOVUE-300) INJECTION 61% COMPARISON:  None. FINDINGS: Lower chest: Chronic interstitial changes noted in the peripheral lung bases bilaterally. Hepatobiliary: No focal abnormality within the liver parenchyma. There is no evidence for gallstones, gallbladder wall thickening, or pericholecystic fluid. No intrahepatic or extrahepatic biliary dilation. Pancreas: No focal mass lesion. No dilatation of the main duct. No intraparenchymal cyst. No peripancreatic edema. Spleen: Small subcapsular fluid collection in the spleen suggests a hematoma. Small defect in the subcapsular aspect of the posterior spleen (image 27/3) is compatible with a small infarct. Adrenals/Urinary Tract: Right adrenal gland unremarkable. 13 mm left adrenal nodule cannot be definitively characterized. Small hypodense lesions in each kidney are compatible with cysts. Cortical scarring noted in the upper pole of the right kidney. No evidence for hydroureter. Small bilateral bladder diverticuli suggests underlying component of bladder outlet obstruction. Stomach/Bowel: Stomach is nondistended. No gastric wall thickening. No evidence of outlet obstruction. Duodenum is normally positioned as is the ligament of Treitz. No small bowel wall thickening. No small bowel dilatation. The terminal ileum is normal. Vascular/Lymphatic: 6.0 x 6.6 cm infrarenal fusiform abdominal aortic aneurysm identified with substantial mural thrombus. There is periaortic edema/hemorrhage tracking to the left with a relatively large hematoma lateral to the left psoas muscle measuring 16.3 x 5.6 x 8.9 cm.  Aneurysm extends into the bifurcation with prominent atherosclerosis in the origin of each common iliac artery. There is no gastrohepatic or hepatoduodenal ligament lymphadenopathy. No intraperitoneal or retroperitoneal lymphadenopathy. No pelvic sidewall lymphadenopathy. Reproductive: The prostate gland and seminal vesicles have normal imaging features. Other: No intraperitoneal free fluid. Musculoskeletal: Bone windows reveal no worrisome lytic or sclerotic osseous lesions. Gas in the subcutaneous fat of the anterior abdominal wall bilaterally is presumably secondary to injections. IMPRESSION: 1. Fusiform infrarenal abdominal aortic aneurysm measures up to 6.6 cm and maximum diameter. There is Peri aneurysm all edema/hemorrhage with a large left-sided retroperitoneal hematoma lateral to the left psoas muscle in tracking down into the left pelvic sidewall. Imaging features are compatible with aortic aneurysm rupture. 2. Probable infarct posterior spleen with small subcapsular hematoma. 3. No substantial intraperitoneal free fluid. 4. Small low-density renal lesions bilaterally, likely cysts. 5. 13 mm left adrenal nodule cannot be further characterized on this study. Critical Value/emergent results were called by me at the time of interpretation on 07/01/2017 at 3:57 pm to Dr. Irine Seal , who verbally acknowledged these results. Electronically Signed   By: Misty Stanley M.D.   On: 07/01/2017 16:01   Dg Chest Port 1 View  Result Date: 07/02/2017 CLINICAL DATA:  Endotracheal and central line placements. EXAM: PORTABLE CHEST 1 VIEW COMPARISON:  06/28/2017 FINDINGS: Endotracheal tube placed with tip measuring 4.4 cm above the carina. Right central venous catheter with tip over the low SVC region. No pneumothorax. Shallow inspiration with atelectasis in the lung bases, increasing since prior study. Small bilateral pleural effusions. Probable emphysematous changes and chronic fibrosis in the lungs. Cardiac  enlargement without vascular congestion. No edema or consolidation. IMPRESSION: Appliances appear in satisfactory position. Shallow inspiration with increasing atelectasis in the lung bases. Small bilateral pleural effusions. Chronic emphysematous changes and fibrosis in the lungs. Cardiac enlargement. Electronically Signed   By: Oren Beckmann.D.  On: 07/02/2017 00:42      Assessment/Plan:  INTERVAL HISTORY: He has had a drop in his hemoglobin is awaiting CT angios   Principal Problem:   Ruptured abdominal aortic aneurysm (AAA) (HCC) Active Problems:   Chest pain   CAP (community acquired pneumonia): Clinical   Acute renal failure superimposed on stage 3 chronic kidney disease (HCC)   Dehydration   Leukocytosis   Elevated troponin   Dizziness   Vertigo   Acute pericarditis: Probable   Bacteremia due to Streptococcus   CVA (cerebral vascular accident) ([place])   LLQ abdominal pain   Hypotension   Hemorrhagic shock (HCC)   Acute blood loss anemia   S/P AAA repair    Thomas [name] is a 62 y.o. male with multiple medical problems now with pneumococcal bacteremia, pericarditis pneumonia, infarcts in the CNS and a ruptured abdominal aortic aneurysm.  He was taken to the operating room last night and underwent  Endovascular repair of ruptured abdominal aortic aneurysm. Bilateral ultrasound-guided common femoral artery access Bilateral iliofemoral endarterectomy  Bilateral iliac, femoral, superficial femoral, and popliteal artery thromboembolectomy  Keep him on CNS dosing with ceftriaxone 2 g IV every 12 hours.  Cardiology holding off on transesophageal echocardiogram at this point which is not unreasonable given his clinical status   He is now undergoing CT angiogram to assess potential bleeding.   LOS: 5 days   Thomas [name] 07/03/2017, 9:51 AM

## 2017-07-03 NOTE — Progress Notes (Addendum)
[facility] like his belly is tight, weakness and numbness in the right LE   Objective 120/74 77 98.6 F (37 C) (Oral) 17 93%  Intake/Output Summary (Last 24 hours) at 07/03/2017 0735 Last data filed at 07/03/2017 0700 Gross per 24 hour  Intake 3435 ml  Output 2020 ml  Net 1415 ml    Palpable DP, biphasic DP/PT on doppler Groin incision healing well, without hematoma 4/5 right LE, 5/5 left LE Lungs clear to auscultation Heart RRR  Assessment/Planning: POD #2  62 y.o. male is s/p endovascular repair of ruptured AAA, bilateral iliofemoral endarterectomy, BLE thromboembolectomies 1 Day Post-Op   HGB 7.7 will transfuse 2 units Graft is patent with distal palpable pulses.   Thomas [name] 07/03/2017 7:35 AM --  Laboratory Lab Results: Recent Labs    07/02/17 1555 07/03/17 0300  WBC 17.4* 15.8*  HGB 8.6* 7.7*  HCT 25.8* 22.6*  PLT 147* 155   BMET Recent Labs    07/01/17 2354 07/03/17 0300  NA 133* 136  K 5.4* 3.4*  CL 109 100*  CO2 17* 28  GLUCOSE 141* 144*  BUN 22* 30*  CREATININE 1.41* 1.32*  CALCIUM 7.1* 6.8*    COAG Lab Results  Component Value Date   INR 1.04 07/01/2017   INR 1.23 06/29/2017   No results found for: PTT  CTA pending due to recent drop in Hb.  Will follow up after scan  [facility]

## 2017-07-03 NOTE — Progress Notes (Addendum)
PULMONARY / CRITICAL CARE MEDICINE   Name: Thomas [name] MRN: [id number] DOB: 06/29/1955    ADMISSION DATE:  06/28/2017   CHIEF COMPLAINT:  Vent Dependent s/p OR   HISTORY OF PRESENT ILLNESS:   62 year old male with PMH of Tobacco Abuse, HTN, and Chronic Kidney Disease Stage 3.   Presents to ED on 4/4 with fever, chills, nonproductive cough, and chest pain. On arrival to ED WBC 24.1. U/A negative. Flu negative. CXR with interstitial lung disease with pleural scarring. Troponin 0.33. EKG with ST elevation in the anterior leads. Cardiology consulted, believes due to acute pericarditis. Started on ASA. Placed on azithromycin and rocephin for probable clinical pneumonia. Admitted to [facility].  Neurology was consulted. MRI Brain with 3 acute areas of restricted diffusion in the left cerebellum as well as evidence of old small bilateral cerebellar infarcts and old basal ganglia/thalamic infarcts.  During stay blood cultures positive for Pnemococcal Bacteremia. Concern for Bacterial endocarditis.   On 4/7 patient with diffuse abdominal pain with drop in BP to systolic 78G with HR 956O. Concern for ruptured aortic aneurysm. CT A/P wth 6.6 cm diameter aortic aneurysm with findings concerning for rupture. Vascular Surgery Consulted. Patient taken to OR for further evaluation. During OR stent was placed, however in PACU patient was noted to have no pulses in bilateral femoral. Taken to back to OR for bilateral Thrombectomy.      SUBJECTIVE:  Awake, alert and following commands Off vent , in NAD Complaints of abdominal pain L>R  VITAL SIGNS: BP 120/74 (BP Location: Right Arm)   Pulse 77   Temp 97.8 F (36.6 C) (Oral)   Resp 17   Ht 5\' 5"  (1.651 m)   Wt 143 lb 11.8 oz (65.2 kg)   SpO2 93%   BMI 23.92 kg/m   HEMODYNAMICS: CVP:  [11 mmHg-12 mmHg] 12 mmHg  VENTILATOR SETTINGS: Vent Mode: PSV;CPAP FiO2 (%):  [40 %] 40 % PEEP:  [5 cmH20] 5 cmH20 Pressure Support:  [5 cmH20] 5 cmH20  INTAKE /  OUTPUT: I/O last 3 completed shifts: In: 6576.8 [I.V.:5916.8; IV Piggyback:660] Out: 2725 [Urine:2675; Blood:50]  PHYSICAL EXAMINATION: General:  Adult make, supine in bed, Awake and alert, conversant Neuro: Awake, alert and oriented x 3, MAE x 4 HEENT:  NCAT, No JVD Cardiovascular: Sinus rhythm.  S1-S2, RRR, No RMG Lungs: Few wheezes, Diminished per bases Abdomen: Abdomen soft  And tender to palpation, L>R  Positive bowel sounds in all 4 quadrants. Musculoskeletal:  -edema bilateral femoral incisions are clean dry and intact.  Popliteal pulses intact bilaterally.  Tibial pulses intact bilaterally. Skin:  Warm, dry, surgical incision to bilateral groin  LABS:  BMET Recent Labs  Lab 07/01/17 1632  07/01/17 2256 07/01/17 2354 07/03/17 0300  NA 132*   < > 138 133* 136  K 4.0   < > 6.1* 5.4* 3.4*  CL 105  --   --  109 100*  CO2 16*  --   --  17* 28  BUN 23*  --   --  22* 30*  CREATININE 1.47*  --   --  1.41* 1.32*  GLUCOSE 117*  --   --  141* 144*   < > = values in this interval not displayed.    Electrolytes Recent Labs  Lab 07/01/17 0654 07/01/17 1632 07/01/17 2354 07/02/17 0012 07/03/17 0300  CALCIUM 8.3* 7.9* 7.1*  --  6.8*  MG 2.0  --   --  1.9 2.2  PHOS  --   --   --  4.1 1.6*    CBC Recent Labs  Lab 07/02/17 0612 07/02/17 1555 07/03/17 0300  WBC 16.4* 17.4* 15.8*  HGB 9.1* 8.6* 7.7*  HCT 27.4* 25.8* 22.6*  PLT 147* 147* 155    Coag's Recent Labs  Lab 06/29/17 0320 07/01/17 1632  APTT 47*  --   INR 1.23 1.04    Sepsis Markers Recent Labs  Lab 06/29/17 2128 07/01/17 2354 07/02/17 0319 07/02/17 0612 07/03/17 0300  LATICACIDVEN  --  1.4 1.1 1.0  --   PROCALCITON 39.90  --  30.58  --  17.42    ABG Recent Labs  Lab 07/01/17 2256 07/02/17 0055 07/02/17 0447  PHART 7.236* 7.219* 7.276*  PCO2ART 46.7 39.9 45.5  PO2ART 160.0* 154.0* 187*    Liver Enzymes Recent Labs  Lab 07/01/17 1632  AST 40  ALT 27  ALKPHOS 81  BILITOT 1.2   ALBUMIN 2.4*    Cardiac Enzymes Recent Labs  Lab 06/28/17 2120 06/29/17 0320 06/29/17 0904  TROPONINI 0.45* 0.41* 0.34*    Glucose Recent Labs  Lab 07/02/17 0309 07/02/17 0609 07/02/17 1132 07/02/17 1821 07/02/17 2331 07/03/17 0501  GLUCAP 125* 117* 129* 132* 130* 144*    Imaging No results found.   STUDIES:  CXR 4/4 > 1. Interstitial lung disease possibly chronic. A component of basilar bronchiectasis most likely present in the lung bases. Pleural scarring. Tortuous thoracic aorta. Heart size normal. No pulmonary venous Congestion. MR Brain 4/4 > Three acute nonhemorrhagic small LEFT cerebellar infarcts. Old small bilateral cerebellar infarcts. Old basal ganglia and thalamus infarcts. MRA Head 4/6 > Occlusion of the right M1 segment. Severe stenosis distal right vertebral artery. Mild to moderate stenosis in the posterior cerebral artery bilaterally. MR Brain W/C 4/6 > Normal enhancement postcontrast infusion. Small acute infarcts left cerebellum do not enhance MR Neck 4/6 >  Negative for carotid stenosis in the neck. Dominant left vertebral artery widely patent. Small right vertebral artery with moderate to severe stenosis proximally and distally CT A/P 4/7 > 1. Fusiform infrarenal abdominal aortic aneurysm measures up to 6.6 cm and maximum diameter. There is Peri aneurysm all edema/hemorrhage with a large left-sided retroperitoneal hematoma lateral to the left psoas muscle in tracking down into the left pelvic sidewall. Imaging features are compatible with aortic aneurysm rupture. 2. Probable infarct posterior spleen with small subcapsular hematoma. 3. No substantial intraperitoneal free fluid. 4. Small low-density renal lesions bilaterally, likely cysts. 5. 13 mm left adrenal nodule cannot be further characterized on this study.  CULTURES: U/A 4/4 > Negative  Blood 4/4 > Streptococcus pneumoniae Blood 4/6 >>  Sputum 4/7 >> GS with few Gram + cocci in  pairs, few gram - rods, RARE GRAM NEGATIVE COCCOBACILLI  ANTIBIOTICS: Azithromycin 4/4 -discontinued 06/29/2017 Rocephin 4/4 >>  Ancef 4/7   SIGNIFICANT EVENTS: 4/4 > Presents to ED  4/7 > Taken to OR  4/8 > Diffuse abdominal pain for repeat CT Abdomen  LINES/TUBES: RIJ CVC 4/7 >> ETT 4/7 >> 4/8   DISCUSSION: 62 year old male presents to ED on 4/4 with fever, chills, nonproductive cough, and chest pain. During stay blood cultures positive for Pnemococcal Bacteremia. Concern for Bacterial endocarditis. On 4/7 patient with diffuse abdominal pain with drop in BP to systolic 01U with HR 272Z.  Abdominal CAT scan showed ruptured AAA.  Patient taken for endovascular repair.  Patient also status post bilateral femoral thrombectomy   ASSESSMENT / PLAN:  PULMONARY A: Ventilatory dependent-intubated preoperatively for AAA repair H/O Tobacco Use  ILD per CT P:   Extubated 4/8 Trend CXR/ABG prn  Aggressive Pulmonary Hygiene  Mobilize per Surgery IS Maintain sats > 94% Will need outpatient follow up for ILD  CARDIOVASCULAR A:  Hemorrhagic Shock in setting of Ruptured Aortic Aneurysm s/p stent placement  S/P Bilateral Thrombectomy  Pericarditis  Concern for Bacterial Endocarditis  H/O HTN, HLD  P:  Cardiology Following.  Discussed the case with cardiology again today.   Will hold off on TEE for now and wait until patient CT abdomen is done. ( New abdominal pain). Cardiac Monitoring  Pressors off at present Maintain MAP > 65 TTE showed no vegetations. Trend Troponin until peak and decrease noted  RENAL A:   Chronic Kidney Disease Stage 3  Creatinine trending down Lactic acid to 1 on 4/8 Hypokalemia Hypophos P:   Trend BMP./ Urine output   DC bicarb gtt Replace electrolytes as indicated ( K Phos 4/9) Avoid Nephrotoxic medications   GASTROINTESTINAL A:   SUP  P:   NPO PPI  Tender slightly firm abdomen>> ( L>R) For CT 4/9 am  HEMATOLOGIC A:   Acute blood loss  anemia secondary to ruptured aortic aneurysm and retroperitoneal bleed. P:   Hold all anticoagulation  Monitor CBC every 6 hours. Transfusing 2 units per TCTS for HGB 7.7 PAS hose   INFECTIOUS A:   Pnemococcal Bacteremia concerning for CNS involvement possible endocarditis. Leukocytosis P:   ID following > Rocephin  Trend WBC and Fever Curve  Follow Repeat Culture Data  Plans for TEE as above  Pro calcitonin trending down.17.42 on 4/9  ENDOCRINE A:   No issues    P:   Trend Glucose CBG's   NEUROLOGIC A:   Sedation Needs  Left Cerebellar Infarcts (Believed due to Septic Emboli)  MRA with Chronic Right M1 Occlusion, Bilateral PCA Stenosis and Chronic Abdominal Right Vertebral Artery Stenosis  MRI with old bilateral basal ganglia lacunar infarcts  Awake and alert, following commands, MAE x 4 P:   RASS goal: 0/-1 Neurology Following  Sedation off   FAMILY   - Inter-disciplinary family meet or Palliative Care meeting due by: 07/08/2017  - No family, updated patient   Magdalen Spatz, AGACNP-BC [facility] Pager: [phone]

## 2017-07-03 NOTE — Progress Notes (Signed)
[facility] Coordinator will follow pt with ID team to support home infusion pharmacy services for home IV ABX at DC if needed.  If patient discharges after hours, please call [phone].   Thomas [name] 07/03/2017, 9:28 AM

## 2017-07-04 ENCOUNTER — Encounter: Payer: Self-pay | Admitting: Physician Assistant

## 2017-07-04 ENCOUNTER — Inpatient Hospital Stay (HOSPITAL_COMMUNITY): Payer: Private Health Insurance - Indemnity

## 2017-07-04 DIAGNOSIS — A403 Sepsis due to Streptococcus pneumoniae: Secondary | ICD-10-CM

## 2017-07-04 LAB — BPAM RBC
BLOOD PRODUCT EXPIRATION DATE: 201905012359
Blood Product Expiration Date: 201904292359
Blood Product Expiration Date: 201904292359
Blood Product Expiration Date: 201905012359
ISSUE DATE / TIME: 201904091058
ISSUE DATE / TIME: 201904091354
ISSUE DATE / TIME: 201904090750
ISSUE DATE / TIME: 201904090750
UNIT TYPE AND RH: 5100
UNIT TYPE AND RH: 5100
Unit Type and Rh: 5100
Unit Type and Rh: 5100

## 2017-07-04 LAB — COMPREHENSIVE METABOLIC PANEL
ALBUMIN: 2.1 g/dL — AB (ref 3.5–5.0)
ALT: 50 U/L (ref 17–63)
ANION GAP: 10 (ref 5–15)
AST: 151 U/L — ABNORMAL HIGH (ref 15–41)
Alkaline Phosphatase: 92 U/L (ref 38–126)
BUN: 22 mg/dL — ABNORMAL HIGH (ref 6–20)
CO2: 26 mmol/L (ref 22–32)
Calcium: 7.3 mg/dL — ABNORMAL LOW (ref 8.9–10.3)
Chloride: 102 mmol/L (ref 101–111)
Creatinine, Ser: 1.22 mg/dL (ref 0.61–1.24)
GFR calc non Af Amer: >60 mL/min (ref >60)
GLUCOSE: 158 mg/dL — AB (ref 65–99)
Potassium: 3.2 mmol/L — ABNORMAL LOW (ref 3.5–5.1)
SODIUM: 138 mmol/L (ref 135–145)
Total Bilirubin: 0.6 mg/dL (ref 0.3–1.2)
Total Protein: 5.7 g/dL — ABNORMAL LOW (ref 6.5–8.1)

## 2017-07-04 LAB — CULTURE, RESPIRATORY W GRAM STAIN

## 2017-07-04 LAB — CBC
HCT: 28.8 % — ABNORMAL LOW (ref 39.0–52.0)
Hemoglobin: 9.9 g/dL — ABNORMAL LOW (ref 13.0–17.0)
MCH: 26.2 pg (ref 26.0–34.0)
MCHC: 34.4 g/dL (ref 30.0–36.0)
MCV: 76.2 fL — ABNORMAL LOW (ref 78.0–100.0)
PLATELETS: 158 10*3/uL (ref 150–400)
RBC: 3.78 MIL/uL — ABNORMAL LOW (ref 4.22–5.81)
RDW: 14.5 % (ref 11.5–15.5)
WBC: 15.1 10*3/uL — AB (ref 4.0–10.5)

## 2017-07-04 LAB — TYPE AND SCREEN
ABO/RH(D): O POS
Antibody Screen: NEGATIVE
UNIT DIVISION: 0
UNIT DIVISION: 0
Unit division: 0
Unit division: 0

## 2017-07-04 LAB — BASIC METABOLIC PANEL
Anion gap: 11 (ref 5–15)
BUN: 20 mg/dL (ref 6–20)
CHLORIDE: 101 mmol/L (ref 101–111)
CO2: 26 mmol/L (ref 22–32)
CREATININE: 1.24 mg/dL (ref 0.61–1.24)
Calcium: 7.4 mg/dL — ABNORMAL LOW (ref 8.9–10.3)
GFR calc Af Amer: >60 mL/min (ref >60)
GFR calc non Af Amer: >60 mL/min (ref >60)
GLUCOSE: 136 mg/dL — AB (ref 65–99)
Potassium: 2.9 mmol/L — ABNORMAL LOW (ref 3.5–5.1)
Sodium: 138 mmol/L (ref 135–145)

## 2017-07-04 LAB — CK
CK TOTAL: 4056 U/L — AB (ref 49–397)
CK TOTAL: 3553 U/L — AB (ref 49–397)
Total CK: 4254 U/L — ABNORMAL HIGH (ref 49–397)
Total CK: 3399 U/L — ABNORMAL HIGH (ref 49–397)

## 2017-07-04 LAB — PROCALCITONIN: PROCALCITONIN: 8.33 ng/mL

## 2017-07-04 LAB — GLUCOSE, CAPILLARY
Glucose-Capillary: 167 mg/dL — ABNORMAL HIGH (ref 65–99)
Glucose-Capillary: 158 mg/dL — ABNORMAL HIGH (ref 65–99)
Glucose-Capillary: 100 mg/dL — ABNORMAL HIGH (ref 65–99)

## 2017-07-04 LAB — CULTURE, RESPIRATORY: CULTURE: NORMAL

## 2017-07-04 MED ORDER — HYDROCORTISONE NA SUCCINATE PF 100 MG IJ SOLR
50.0000 mg | Freq: Two times a day (BID) | INTRAMUSCULAR | Status: DC
  Administered 2017-07-04 – 2017-07-05 (×2): 50 mg via INTRAVENOUS
  Filled 2017-07-04 (×2): qty 2

## 2017-07-04 MED ORDER — POTASSIUM CHLORIDE 10 MEQ/100ML IV SOLN
10.0000 meq | INTRAVENOUS | Status: AC
  Administered 2017-07-04 (×4): 10 meq via INTRAVENOUS
  Filled 2017-07-04: qty 100

## 2017-07-04 MED ORDER — SENNOSIDES-DOCUSATE SODIUM 8.6-50 MG PO TABS
2.0000 | ORAL_TABLET | Freq: Every day | ORAL | Status: DC
  Administered 2017-07-04 – 2017-07-13 (×9): 2 via ORAL
  Filled 2017-07-04 (×9): qty 2

## 2017-07-04 MED ORDER — POTASSIUM CHLORIDE 10 MEQ/100ML IV SOLN
10.0000 meq | INTRAVENOUS | Status: AC
  Administered 2017-07-04 – 2017-07-05 (×5): 10 meq via INTRAVENOUS
  Filled 2017-07-04 (×4): qty 100

## 2017-07-04 MED ORDER — MAGNESIUM CITRATE PO SOLN
1.0000 | Freq: Once | ORAL | Status: DC | PRN
  Filled 2017-07-04: qty 296

## 2017-07-04 MED ORDER — POTASSIUM CHLORIDE 10 MEQ/50ML IV SOLN
INTRAVENOUS | Status: AC
  Filled 2017-07-04: qty 100

## 2017-07-04 MED ORDER — FUROSEMIDE 10 MG/ML IJ SOLN
40.0000 mg | Freq: Once | INTRAMUSCULAR | Status: AC
  Administered 2017-07-04: 40 mg via INTRAVENOUS
  Filled 2017-07-04: qty 4

## 2017-07-04 MED ORDER — SORBITOL 70 % SOLN
30.0000 mL | Freq: Every day | Status: DC | PRN
  Administered 2017-07-04: 30 mL via ORAL
  Filled 2017-07-04: qty 30

## 2017-07-04 NOTE — Progress Notes (Signed)
   Would continue to recommend holding TEE for now. Let's let him continue to improve prior to this elective procedure. Continue IV abx.   Candee Furbish, MD

## 2017-07-04 NOTE — Progress Notes (Signed)
[facility] has complaints of abdominal tightness and discomfort.  Decreased sensation in the right foot, improved active range of motion.    Objective (!) 167/81 (!) 64 98.2 F (36.8 C) (Oral) 18 91%  Intake/Output Summary (Last 24 hours) at 07/04/2017 0801 Last data filed at 07/04/2017 0700 Gross per 24 hour  Intake 2625 ml  Output 2500 ml  Net 125 ml   CTA 07/03/2017 Lungs/Pleura: Trace bilateral pleural fluid. Centrilobular emphysema. A small amount of mucus in the mainstem bronchi. Diffuse patchy airspace disease with areas of interstitial thickening. Findings are suggestive for pulmonary edema but cannot exclude widespread infectious or inflammatory process. Aorta: Endovascular repair of the abdominal aortic aneurysm. Stent graft is positioned below the renal veins. The main body of the stent graft is patent but there is a high-grade stenosis involving the right graft limb. Stenosis measures roughly 60-70%. Stenosis is related to a large amount of thrombus within the stent limb. The left stent limb is widely patent. There is pocket of gas anterior to the right limb and probably related to recent surgery. Aneurysm sac has minimally changed in size measuring 6.5 x 5.9 cm and previously measuring 6.6 x 6.0 cm. Limited evaluation for endoleak due to the lack of precontrast images through the abdominal aorta and the lack of delayed images. However, there is contrast in the IMA up to the origin and patient could be at risk for a type 2 endoleak.  Celiac: Patent without evidence of aneurysm, dissection, vasculitis or significant stenosis.  SMA: SMA is patent with mild narrowing in the proximal aspect. No evidence for dissection or aneurysm.  Renals: Single right renal artery with irregularity along the proximal aspect. Findings compatible with mild-to-moderate stenosis approximately 1 cm from the right renal artery  origin. There are at least 2 left renal arteries which are patent. No evidence for dissection or aneurysm involving the left renal arteries.  IMA: There is contrast within the IMA including near the origin. IMA could be associated with a type 2 endoleak but difficult to characterize on these images.  Inflow: Thrombus within the right graft limb as described in the aorta section. Stent graft terminates in the right common iliac artery. There is thrombus along the distal aspect of the right limb graft and thrombus at the right iliac artery bifurcation thrombus extending into the right hypogastric artery. Right external iliac artery is patent. There appears to be a small amount of thrombus in the proximal right SFA.  Left limb graft is patent and terminates in the left common iliac artery. No significant thrombus or stenosis in the left limb graft. The left hypogastric artery is patent. Left external iliac artery is patent. Proximal left femoral arteries are patent.  Palpable DP pulses B, feet warm well perfused Groin incisions healing well without hematoma Improved SLR, active range of motion of right LE. Left LE full motion without weakness Abdomin soft, no audible BS to auscultation this am Heart RRR Lungs NAD, but working some to breath.  O2 SAT 97 % on  O2.  Assessment/Planning: POD # 3 62 y.o.maleis s/p endovascular repair of ruptured AAA, bilateral iliofemoral endarterectomy, BLE thromboembolectomies   Continued elevated WBC 15.1, Continue antibiotics with pneumococcal bacteremia, pericarditis pneumonia   HGB improved s/p 2 units PRBC 9.9 this am Improved active motion of right LE CTA was reviewed by Dr. Trula Slade yesterday which shows thrombus in the right limb of the graft.  Endo leak  at the IMA Type 2.  He does not recommend further intervention at this time.  Please refer to note by Dr. Trula Slade on 07/03/2017.   He has a pending chest x ray today.  History of  emphysema.   Hypokalemia 3.2 Potassium repletion started yesterday.  Pending further repletion per primary team.  Roxy Horseman 07/04/2017 8:01 AM --  Laboratory Lab Results: Recent Labs    07/03/17 1854 07/04/17 0359  WBC 15.7* 15.1*  HGB 10.3* 9.9*  HCT 35.5* 28.8*  PLT 204 158   BMET Recent Labs    07/03/17 1854 07/04/17 0359  NA 138 138  K 3.1* 3.2*  CL 102 102  CO2 25 26  GLUCOSE 173* 158*  BUN 22* 22*  CREATININE 1.23 1.22  CALCIUM 7.2* 7.3*    COAG Lab Results  Component Value Date   INR 1.04 07/01/2017   INR 1.23 06/29/2017   No results found for: PTT

## 2017-07-04 NOTE — Progress Notes (Signed)
STROKE TEAM PROGRESS NOTE   SUBJECTIVE (INTERVAL HISTORY) No family members present. Pt still complains of belly tightness and sometimes causing him SOB, but his right leg numbness improved from yesterday.    OBJECTIVE Temp:  [97.9 F (36.6 C)-98.7 F (37.1 C)] 98.2 F (36.8 C) (04/10 0700) Pulse Rate:  [31-74] 56 (04/10 0900) Cardiac Rhythm: Normal sinus rhythm (04/10 0400) Resp:  [18-36] 21 (04/10 0900) BP: (107-171)/(68-90) 149/90 (04/10 0900) SpO2:  [90 %-100 %] 95 % (04/10 0900) Weight:  [142 lb 6.7 oz (64.6 kg)] 142 lb 6.7 oz (64.6 kg) (04/10 0500)  CBC:  Recent Labs  Lab 07/01/17 1632  07/03/17 1854 07/04/17 0359  WBC 22.8*   < > 15.7* 15.1*  NEUTROABS 19.9*  --  12.5*  --   HGB 12.4*   < > 10.3* 9.9*  HCT 36.4*   < > 35.5* 28.8*  MCV 75.1*   < > 88.5 76.2*  PLT 155   < > 204 158   < > = values in this interval not displayed.    Basic Metabolic Panel:  Recent Labs  Lab 07/02/17 0012 07/03/17 0300 07/03/17 1854 07/04/17 0359  NA  --  136 138 138  K  --  3.4* 3.1* 3.2*  CL  --  100* 102 102  CO2  --  28 25 26   GLUCOSE  --  144* 173* 158*  BUN  --  30* 22* 22*  CREATININE  --  1.32* 1.23 1.22  CALCIUM  --  6.8* 7.2* 7.3*  MG 1.9 2.2  --   --   PHOS 4.1 1.6*  --   --     Lipid Panel:     Component Value Date/Time   CHOL 128 06/29/2017 0320   TRIG 97 06/29/2017 0320   HDL 37 (L) 06/29/2017 0320   CHOLHDL 3.5 06/29/2017 0320   VLDL 19 06/29/2017 0320   LDLCALC 72 06/29/2017 0320   HgbA1c:  Lab Results  Component Value Date   HGBA1C 5.6 06/29/2017   Urine Drug Screen: No results found for: LABOPIA, COCAINSCRNUR, LABBENZ, AMPHETMU, THCU, LABBARB  Alcohol Level No results found for: [place] I have personally reviewed the radiological images below and agree with the radiology interpretations.  Mr Brain Wo Contrast 06/28/2017 IMPRESSION:  1. Three acute nonhemorrhagic small LEFT cerebellar infarcts.  2. Old small bilateral cerebellar  infarcts. Old basal ganglia and thalamus infarcts.   Mr Jodene Nam Head Wo Contrast 06/30/2017 IMPRESSION:  Occlusion of the right M1 segment.  Severe stenosis distal right vertebral artery.  Mild to moderate stenosis in the posterior cerebral artery bilaterally.   Mr Jodene Nam Neck W Wo Contrast 06/30/2017 IMPRESSION:  Negative for carotid stenosis in the neck  Dominant left vertebral artery widely patent.  Small right vertebral artery with moderate to severe stenosis proximally and distally.  Mr Jeri Cos Contrast 06/30/2017 IMPRESSION:  Normal enhancement postcontrast infusion. Small acute infarcts left cerebellum do not enhance.   Transthoracic Echocardiogram  06/30/2017 Study Conclusions - Left ventricle: The cavity size was normal. There was moderate   focal basal hypertrophy. Systolic function was normal. The   estimated ejection fraction was in the range of 60% to 65%. Wall   motion was normal; there were no regional wall motion   abnormalities. The study is not technically sufficient to allow   evaluation of LV diastolic function. - Aortic valve: Trileaflet; mildly thickened, mildly calcified   leaflets. There was mild regurgitation.  Ct Abdomen Pelvis  W Contrast 07/01/2017 IMPRESSION: 1. Fusiform infrarenal abdominal aortic aneurysm measures up to 6.6 cm and maximum diameter. There is Peri aneurysm all edema/hemorrhage with a large left-sided retroperitoneal hematoma lateral to the left psoas muscle in tracking down into the left pelvic sidewall. Imaging features are compatible with aortic aneurysm rupture. 2. Probable infarct posterior spleen with small subcapsular hematoma. 3. No substantial intraperitoneal free fluid. 4. Small low-density renal lesions bilaterally, likely cysts. 5. 13 mm left adrenal nodule cannot be further characterized on this study.  Dg Chest Port 1 View  07/02/2017 IMPRESSION: Appliances appear in satisfactory position. Shallow inspiration with increasing atelectasis  in the lung bases. Small bilateral pleural effusions. Chronic emphysematous changes and fibrosis in the lungs. Cardiac enlargement.   TEE pending  CTA aortic artery 1. Left retroperitoneal hematoma has decreased in size and no evidence for new or increased retroperitoneal bleeding. 2. Endovascular repair of the abdominal aortic aneurysm. Gas within the abdominal aortic sac probably represents postoperative changes. Patient is at risk for a type 2 endoleak from the IMA but difficult to characterize on this examination and recommend attention on follow up imaging. 3. Thrombus in the right stent graft limb causing 60-70% narrowing of the lumen. Thrombus in the distal common iliac artery and at the iliac artery bifurcation. 4. Severe atherosclerotic disease involving the thoracic aorta. Thrombus within the right graft limb could be related to the recent surgery but patient is also at risk for embolic disease from the thoracic aorta. Right renal artery stenosis. 5. Aneurysm of the ascending thoracic aorta measuring at least 4.4 cm. No evidence for an thoracic aortic dissection. Recommend annual imaging followup by CTA or MRA. This recommendation follows 2010 ACCF/AHA/AATS/ACR/ASA/SCA/SCAI/SIR/STS/SVM Guidelines for the Diagnosis and Management of Patients with Thoracic Aortic Disease. Circulation. 2010; 121: [id number] 6. Small bilateral effusions with diffuse bilateral lung densities. Findings are suggestive for pulmonary edema but cannot exclude infection. 7. Indeterminate left adrenal nodule. Recommend attention on follow up imaging.   PHYSICAL EXAM  Temp:  [97.9 F (36.6 C)-98.7 F (37.1 C)] 98.2 F (36.8 C) (04/10 0700) Pulse Rate:  [31-74] 56 (04/10 0900) Resp:  [18-36] 21 (04/10 0900) BP: (107-171)/(68-90) 149/90 (04/10 0900) SpO2:  [90 %-100 %] 95 % (04/10 0900) Weight:  [142 lb 6.7 oz (64.6 kg)] 142 lb 6.7 oz (64.6 kg) (04/10 0500)  General - Well nourished, well  developed, in no apparent distress.  Ophthalmologic - Fundi not visualized due to small pupils.  Cardiovascular - Regular rate and rhythm.  Abdomen - tight, tenderness on palpation at LLQ > RLQ  Mental Status -  Level of arousal and orientation to time, place, and person were intact. Language including expression, naming, repetition, comprehension was assessed and found intact. Fund of Knowledge was assessed and was intact.  Cranial Nerves II - XII - II - Visual field intact OU. III, IV, VI - Extraocular movements intact. V - Facial sensation intact bilaterally. VII - Facial movement intact bilaterally. VIII - Hearing & vestibular intact bilaterally. X - Palate elevates symmetrically. XI - Chin turning & shoulder shrug intact bilaterally. XII - Tongue protrusion intact.  Motor Strength - The patient's strength was normal in BUEs, however, BLE proximal 4-/5 due to pain at b/l groin area, distally LLE 5/5 and RLE 4/5 DF, 5/5 PF and pronator drift was absent.  Bulk was normal and fasciculations were absent.   Motor Tone - Muscle tone was assessed at the neck and appendages and was normal.  Reflexes - The  patient's reflexes were 2+ in all extremities and he had no pathological reflexes.  Sensory - Light touch symmetrical bilaterally, pinprick were decreased at R thigh, hypersensitivity at R foreleg, symmetrical at left foot.    Coordination - The patient had normal movements in the hands with no ataxia or dysmetria.  Tremor was absent.  Gait and Station - not tested   ASSESSMENT/PLAN [name] is a 62 y.o. male with history of hypertension, history of tobacco use and previous strokes by imaging presenting with weakness, chills, dizziness, cough, left-sided chest pain, abdominal pain, nausea and vomiting. He did not receive IV t-PA due to late presentation.  RLE weakness and numbness - improved  Improved from yesterday  CTA aortic artery pending but seems to have in stent  stenosis at internal illiac artery  However, distal pulse dopplerable and right foot warm  Sensory improved R thigh mild decreased pinprick, R foreleg hypersensitivity and R foot symmetrical with left foot. DTR 2+ and no babinski. Unlikely related to stroke, spinal cord or peripheral nerve injury  More consistent with PVD   Continue monitor as per VVS  Stroke: 3 acute LEFT cerebellar infarcts possibly septic emboli vs. hypotension in the setting of vascular stenosis and AAA rupture.   MRI head with and without contrast - Three acute nonhemorrhagic small LEFT cerebellar infarcts. Old small bilateral cerebellar infarcts. Old basal ganglia and thalamus infarcts.   MRA H&N - right M1 occlusion, likely chronic.  Bilateral PCA stenosis.  Terminal right vertebral artery stenosis. No mycotic aneurysm.   2D Echo - EF 60-65%. Cardiac source of emboli identified.  LDL - 72  HgbA1c - 5.6  TEE pending  VTE prophylaxis -subcutaneous heparin Diet clear liquid Room service appropriate? Yes; Fluid consistency: Thin  No antithrombotic prior to admission, recommend ASA 81mg  once hemoglobin stable.  Patient counseled to be compliant with his antithrombotic medications  Ongoing aggressive stroke risk factor management  Therapy recommendations: pending  Disposition:  Pending  AAA rupture with large left retroperitoneal hematoma  Likely to explain hypotension and anemia  S/p emergent endovascular repair of ruptured AAA, bilateral iliofemoral endarterectomy, BLE thromboembolectomies   improved RLE numbness and mild DF weakness at right foot  CTA aorta right illiac artery in stent thrombosis  Off neo  VVS on board  Possible pericarditis and endocarditis  EKG concerning for pericarditis  Elevated troponin 0.34->0.45->0.41->0.34  Elevated WBC 22.8->19.1->16.4->15.1  Blood culture positive strep peumo  ID on board - on rocephin  Cardiology on board  TTE EF 60-65% no vegetation  seen  TEE still pending per cardiology  PVD  Hx of smoking  s/p endovascular repair of ruptured AAA, bilateral iliofemoral endarterectomy, BLE thromboembolectomies   VVS on board  Recommend ASA 81mg  once hemoglobin stable  Intracranial vascular occlusion / stenosis  MRA H&N - right M1 occlusion, likely chronic. Bilateral PCA stenosis. Terminal right vertebral artery stenosis.  Risk factor modification   Avoid hypotension   Hypotension   Multifactorial  Likely due to AAA rupture, blood loss, sepsis  Off neo  BP stable  BP goal 110-140 due to right M1 occlusion but also AAA rupture.   Anemia   Likely due to blood loss, sepsis, endocarditis  CT large left retroperitoneal hematoma  Hb 12.9->9.9->9.1->7.7->9.9  S/p PRBC x 2  Close monitoring  Hyperlipidemia  Lipid lowering medication PTA: none  LDL 72, goal < 70  Current lipid lowering medication: Lipitor 40 mg daily  Continue statin at discharge  Tobacco abuse  Current smoker  Smoking cessation counseling provided  Pt is willing to quit  Other Stroke Risk Factors  Advanced age  Hx stroke/TIA on imaging  Other Active Problems  Interstitial lung disease possibly chronic by [facility] day # 6  Neurology will follow peripherally. Please call us back after TEE result available.   Rosalin Hawking, MD PhD Stroke Neurology 07/04/2017 11:37 AM    To contact Stroke Continuity provider, please refer to [email or web address] After hours, contact General Neurology

## 2017-07-04 NOTE — Progress Notes (Addendum)
PULMONARY / CRITICAL CARE MEDICINE   Name: Thomas [name] MRN: [id number] DOB: 02/08/1956    ADMISSION DATE:  06/28/2017   CHIEF COMPLAINT:  Vent Dependent s/p OR   HISTORY OF PRESENT ILLNESS:   62 year old male with PMH of Tobacco Abuse, HTN, and Chronic Kidney Disease Stage 3.   Presents to ED on 4/4 with fever, chills, nonproductive cough, and chest pain. On arrival to ED WBC 24.1. U/A negative. Flu negative. CXR with interstitial lung disease with pleural scarring. Troponin 0.33. EKG with ST elevation in the anterior leads. Cardiology consulted, believes due to acute pericarditis. Started on ASA. Placed on azithromycin and rocephin for probable clinical pneumonia. Admitted to [facility].  Neurology was consulted. MRI Brain with 3 acute areas of restricted diffusion in the left cerebellum as well as evidence of old small bilateral cerebellar infarcts and old basal ganglia/thalamic infarcts.  During stay blood cultures positive for Pnemococcal Bacteremia. Concern for Bacterial endocarditis.   On 4/7 patient with diffuse abdominal pain with drop in BP to systolic 21H with HR 086V. Concern for ruptured aortic aneurysm. CT A/P wth 6.6 cm diameter aortic aneurysm with findings concerning for rupture. Taken to OR for endovascular repair of ruptured AAA,  In PACU noted to have absent fem pulses and taken back to OR for bilateral iliofemoral endarterectomy, BLE thromboembolectomies   SUBJECTIVE:  Respiratory status stable  Off pressors  Still some abd pain/tightness   VITAL SIGNS: BP (!) 149/90   Pulse (!) 56   Temp 98.2 F (36.8 C) (Oral)   Resp (!) 21   Ht 5\' 5"  (1.651 m)   Wt 64.6 kg (142 lb 6.7 oz)   SpO2 95%   BMI 23.70 kg/m   HEMODYNAMICS: CVP:  [5 mmHg-9 mmHg] 8 mmHg  VENTILATOR SETTINGS:    INTAKE / OUTPUT: I/O last 3 completed shifts: In: 7846 [P.O.:300; I.V.:2625; Blood:630; IV Piggyback:950] Out: 9629 [BMWUX:3244]  PHYSICAL EXAMINATION: General:  Adult make, sitting up in  bed, awake, alert, NAD  Neuro: Awake, alert and oriented x 3, MAE x 4 HEENT:  NCAT, No JVD Cardiovascular: Sinus rhythm.  S1-S2, RRR, No RMG Lungs: resps even, non labored but mildly tachypneic, few scattered wheeze, bibasilar crackles  Abdomen: Abdomen slightly distended and tender to palpation,  +ps, slightly tender  Musculoskeletal:  -edema bilateral femoral incisions are clean dry and intact.  Popliteal pulses intact bilaterally.  Tibial pulses intact bilaterally. Skin:  Warm, dry, surgical incision to bilateral groin  LABS:  BMET Recent Labs  Lab 07/03/17 0300 07/03/17 1854 07/04/17 0359  NA 136 138 138  K 3.4* 3.1* 3.2*  CL 100* 102 102  CO2 28 25 26   BUN 30* 22* 22*  CREATININE 1.32* 1.23 1.22  GLUCOSE 144* 173* 158*    Electrolytes Recent Labs  Lab 07/01/17 0654  07/02/17 0012 07/03/17 0300 07/03/17 1854 07/04/17 0359  CALCIUM 8.3*   < >  --  6.8* 7.2* 7.3*  MG 2.0  --  1.9 2.2  --   --   PHOS  --   --  4.1 1.6*  --   --    < > = values in this interval not displayed.    CBC Recent Labs  Lab 07/03/17 0300 07/03/17 1854 07/04/17 0359  WBC 15.8* 15.7* 15.1*  HGB 7.7* 10.3* 9.9*  HCT 22.6* 35.5* 28.8*  PLT 155 204 158    Coag's Recent Labs  Lab 06/29/17 0320 07/01/17 1632  APTT 47*  --  INR 1.23 1.04    Sepsis Markers Recent Labs  Lab 07/01/17 2354 07/02/17 0319 07/02/17 0612 07/03/17 0300 07/04/17 0359  LATICACIDVEN 1.4 1.1 1.0  --   --   PROCALCITON  --  30.58  --  17.42 8.33    ABG Recent Labs  Lab 07/01/17 2256 07/02/17 0055 07/02/17 0447  PHART 7.236* 7.219* 7.276*  PCO2ART 46.7 39.9 45.5  PO2ART 160.0* 154.0* 187*    Liver Enzymes Recent Labs  Lab 07/01/17 1632 07/04/17 0359  AST 40 151*  ALT 27 50  ALKPHOS 81 92  BILITOT 1.2 0.6  ALBUMIN 2.4* 2.1*    Cardiac Enzymes Recent Labs  Lab 06/28/17 2120 06/29/17 0320 06/29/17 0904  TROPONINI 0.45* 0.41* 0.34*    Glucose Recent Labs  Lab 07/02/17 2331  07/03/17 0501 07/03/17 1206 07/03/17 1727 07/03/17 2333 07/04/17 0543  GLUCAP 130* 144* 151* 157* 161* 167*    Imaging Dg Chest Port 1 View  Result Date: 07/04/2017 CLINICAL DATA:  Respiratory failure EXAM: PORTABLE CHEST 1 VIEW COMPARISON:  07/02/2017 FINDINGS: Endotracheal tube is been removed in the interval. Right jugular central line is again seen and stable. Chronic fibrotic changes are again identified in both lungs. Improved aeration is noted in the bases bilaterally when compared with the prior exam. No bony abnormality is noted. IMPRESSION: Improved aeration in the bases bilaterally. Some chronic fibrotic changes are again identified and stable. No new focal abnormality is seen. Electronically Signed   By: Inez Catalina M.D.   On: 07/04/2017 08:58   Ct Angio Chest/abd/pel For Dissection W And/or W/wo  Result Date: 07/03/2017 CLINICAL DATA:  62 year old with an abdominal aortic aneurysm and abdominal hematoma. Follow-up aneurysm. Patient is hypotensive and concern for new or increased intra-abdominal bleeding. EXAM: CT ANGIOGRAPHY CHEST, ABDOMEN AND PELVIS TECHNIQUE: Multidetector CT imaging through the chest, abdomen and pelvis was performed using the standard protocol during bolus administration of intravenous contrast. Multiplanar reconstructed images and MIPs were obtained and reviewed to evaluate the vascular anatomy. CONTRAST:  74mL ISOVUE-370 IOPAMIDOL (ISOVUE-370) INJECTION 76%, <See Chart> ISOVUE-370 IOPAMIDOL (ISOVUE-370) INJECTION 76% COMPARISON:  CT 07/01/2017 FINDINGS: CTA CHEST FINDINGS Cardiovascular: Coronary artery calcifications. The size of the ascending thoracic aorta is better calculated on the noncontrast images due to peripheral mural thrombus. The ascending thoracic aorta measures at least 4.4 cm. Evidence for irregular mural thrombus along the posterolateral wall of the ascending thoracic aorta. There is also eccentric thrombus at the aortic arch. Difficult to measure  the true lumen of the descending thoracic aorta due to low-density material along the left side of the descending thoracic aorta which could represent mural thrombus and/or edema in this area. No clear evidence for intramural hematoma or dissection involving the thoracic aorta. Heart size is within normal limits. No significant pericardial fluid. Main and central pulmonary arteries are patent. Great vessels are patent. There is irregular plaque involving the proximal left subclavian artery. Mediastinum/Nodes: Gas adjacent to the right internal jugular vein compatible with recent catheterization. Low-density material throughout the mediastinum compatible with edema. Question a precarinal lymph node measuring 1.7 cm in the short axis on sequence 8, image 54. There may be a few prominent prevascular lymph nodes. Lungs/Pleura: Trace bilateral pleural fluid. Centrilobular emphysema. A small amount of mucus in the mainstem bronchi. Diffuse patchy airspace disease with areas of interstitial thickening. Findings are suggestive for pulmonary edema but cannot exclude widespread infectious or inflammatory process. Musculoskeletal: No acute abnormality. Review of the MIP images confirms the above findings.  CTA ABDOMEN AND PELVIS FINDINGS VASCULAR Aorta: Endovascular repair of the abdominal aortic aneurysm. Stent graft is positioned below the renal veins. The main body of the stent graft is patent but there is a high-grade stenosis involving the right graft limb. Stenosis measures roughly 60-70%. Stenosis is related to a large amount of thrombus within the stent limb. The left stent limb is widely patent. There is pocket of gas anterior to the right limb and probably related to recent surgery. Aneurysm sac has minimally changed in size measuring 6.5 x 5.9 cm and previously measuring 6.6 x 6.0 cm. Limited evaluation for endoleak due to the lack of precontrast images through the abdominal aorta and the lack of delayed images.  However, there is contrast in the IMA up to the origin and patient could be at risk for a type 2 endoleak. Celiac: Patent without evidence of aneurysm, dissection, vasculitis or significant stenosis. SMA: SMA is patent with mild narrowing in the proximal aspect. No evidence for dissection or aneurysm. Renals: Single right renal artery with irregularity along the proximal aspect. Findings compatible with mild-to-moderate stenosis approximately 1 cm from the right renal artery origin. There are at least 2 left renal arteries which are patent. No evidence for dissection or aneurysm involving the left renal arteries. IMA: There is contrast within the IMA including near the origin. IMA could be associated with a type 2 endoleak but difficult to characterize on these images. Inflow: Thrombus within the right graft limb as described in the aorta section. Stent graft terminates in the right common iliac artery. There is thrombus along the distal aspect of the right limb graft and thrombus at the right iliac artery bifurcation thrombus extending into the right hypogastric artery. Right external iliac artery is patent. There appears to be a small amount of thrombus in the proximal right SFA. Left limb graft is patent and terminates in the left common iliac artery. No significant thrombus or stenosis in the left limb graft. The left hypogastric artery is patent. Left external iliac artery is patent. Proximal left femoral arteries are patent. Veins: Limited evaluation on this arterial phase of contrast. Review of the MIP images confirms the above findings. NON-VASCULAR Hepatobiliary: No gross abnormality to the liver. High-density material in the gallbladder. Pancreas: No gross abnormality to the pancreas. Spleen: Limited evaluation on this arterial phase of contrast. A small amount of fluid or stranding along the lateral aspect of the spleen which is similar to the prior examination. Adrenals/Urinary Tract: Again noted is a  hyperdense nodule involving the medial left adrenal gland that measures roughly 1.4 cm. Right adrenal gland appears to be unremarkable. There is a cyst in the right kidney upper pole. Areas of cortical scarring and thinning in the right kidney. Mild fullness of the right renal collecting system. Urinary bladder is decompressed with a Foley catheter. Probable small cyst in the left kidney. Mild fullness in the left renal collecting system and proximal left ureter. Stomach/Bowel: Oral contrast within the colon. No acute abnormalities in stomach, small bowel or large bowel. Appendix has oral contrast without acute inflammatory changes. Lymphatic: Limited evaluation for lymphadenopathy due to the retroperitoneal blood and edema. There does not appear to be significant lymph node enlargement. Reproductive: Prostate is unremarkable. Other: Again noted is a large amount of blood in the left retroperitoneum, lateral and adjacent to the left iliopsoas muscle. The hematoma lateral to the left psoas muscle on sequence 8, image 205 measures 7.0 x 6.0 cm and previously measured 8.9  x 5.6 cm on the recent comparison examination. Hematoma adjacent to the left iliopsoas muscle in the left hemipelvis roughly measures 7.8 x 5.6 cm on sequence 8, image 247 and measured 8.5 x 7.6 cm on the recent comparison examination. Patient now has diffuse subcutaneous edema. There is gas in left groin related to recent surgery. Increased edema in the presacral region. Gas along the lower right anterior abdominal wall compatible with recent surgery. Musculoskeletal: No acute abnormality. Review of the MIP images confirms the above findings. IMPRESSION: 1. Left retroperitoneal hematoma has decreased in size and no evidence for new or increased retroperitoneal bleeding. 2. Endovascular repair of the abdominal aortic aneurysm. Gas within the abdominal aortic sac probably represents postoperative changes. Patient is at risk for a type 2 endoleak from  the IMA but difficult to characterize on this examination and recommend attention on follow up imaging. 3. Thrombus in the right stent graft limb causing 60-70% narrowing of the lumen. Thrombus in the distal common iliac artery and at the iliac artery bifurcation. 4. Severe atherosclerotic disease involving the thoracic aorta. Thrombus within the right graft limb could be related to the recent surgery but patient is also at risk for embolic disease from the thoracic aorta. Right renal artery stenosis. 5. Aneurysm of the ascending thoracic aorta measuring at least 4.4 cm. No evidence for an thoracic aortic dissection. Recommend annual imaging followup by CTA or MRA. This recommendation follows 2010 ACCF/AHA/AATS/ACR/ASA/SCA/SCAI/SIR/STS/SVM Guidelines for the Diagnosis and Management of Patients with Thoracic Aortic Disease. Circulation. 2010; 121: e266-e369 6. Small bilateral effusions with diffuse bilateral lung densities. Findings are suggestive for pulmonary edema but cannot exclude infection. 7. Indeterminate left adrenal nodule. Recommend attention on follow up imaging. These results were called by telephone at the time of interpretation on 07/03/2017 at 11:18 am to Dr. Harold Barban , who verbally acknowledged these results. Electronically Signed   By: Markus Daft M.D.   On: 07/03/2017 11:53     STUDIES:  CXR 4/4 > 1. Interstitial lung disease possibly chronic. A component of basilar bronchiectasis most likely present in the lung bases. Pleural scarring. Tortuous thoracic aorta. Heart size normal. No pulmonary venous Congestion. MR Brain 4/4 > Three acute nonhemorrhagic small LEFT cerebellar infarcts. Old small bilateral cerebellar infarcts. Old basal ganglia and thalamus infarcts. MRA Head 4/6 > Occlusion of the right M1 segment. Severe stenosis distal right vertebral artery. Mild to moderate stenosis in the posterior cerebral artery bilaterally. MR Brain W/C 4/6 > Normal enhancement postcontrast  infusion. Small acute infarcts left cerebellum do not enhance MR Neck 4/6 >  Negative for carotid stenosis in the neck. Dominant left vertebral artery widely patent. Small right vertebral artery with moderate to severe stenosis proximally and distally CT A/P 4/7 > 1. Fusiform infrarenal abdominal aortic aneurysm measures up to 6.6 cm and maximum diameter. There is Peri aneurysm all edema/hemorrhage with a large left-sided retroperitoneal hematoma lateral to the left psoas muscle in tracking down into the left pelvic sidewall. Imaging features are compatible with aortic aneurysm rupture. 2. Probable infarct posterior spleen with small subcapsular hematoma. 3. No substantial intraperitoneal free fluid. 4. Small low-density renal lesions bilaterally, likely cysts. 5. 13 mm left adrenal nodule cannot be further characterized on this Study. CTA chest/abd/pelvis 4/9>>> Left retroperitoneal hematoma has decreased in size and no evidence for new or increased retroperitoneal bleeding.  2. Endovascular repair of the abdominal aortic aneurysm. Gas within the abdominal aortic sac probably represents postoperative changes. Patient is at risk for  a type 2 endoleak from the IMA but difficult to characterize on this examination and recommend attention on follow up imaging. 3. Thrombus in the right stent graft limb causing 60-70% narrowing of the lumen. Thrombus in the distal common iliac artery and at the iliac artery bifurcation. 4. Severe atherosclerotic disease involving the thoracic aorta. Thrombus within the right graft limb could be related to the recent surgery but patient is also at risk for embolic disease from the thoracic aorta. Right renal artery stenosis. 5. Aneurysm of the ascending thoracic aorta measuring at least 4.4 cm. No evidence for an thoracic aortic dissection.  6. Small bilateral effusions with diffuse bilateral lung densities. Findings are suggestive for pulmonary edema but  cannot exclude infection.   CULTURES: U/A 4/4 > Negative  Blood 4/4 > Streptococcus pneumoniae Blood 4/6 >>  Sputum 4/7 >> normal flora   ANTIBIOTICS: Azithromycin 4/4 -discontinued 06/29/2017 Rocephin 4/4 >>  Ancef 4/7   SIGNIFICANT EVENTS: 4/4 > Presents to ED  4/7 > Taken to OR  4/8 > Diffuse abdominal pain for repeat CT Abdomen  LINES/TUBES: RIJ CVC 4/7 >> ETT 4/7 >> 4/8   DISCUSSION: 62YO male with complicated course.  Hx multiple medical problems including ILD, CKD, CVA admitted initially 4/4 with pericarditis, PNA. Found to have pneumococcal bacteremia with concern for endocarditis.   4/7 had abd pain, shock and found to have ruptured AAA now s/p surgical repair c/b loss of femoral pulses requiring BLE thromboembolectomies.   4/8 had new onset RLE weakness found to have 3 acute LEFT cerebellar infarcts likely septic emboli. Extubated 4/8.   ASSESSMENT / PLAN:  PULMONARY A: Acute respiratory failure - in setting hemorrhagic shock r/t ruptured AAA s/p surgical repair. Extubated 4/8.  H/O Tobacco Use  ILD per CT ?PNA  P:   Trend CXR/ABG prn  Aggressive Pulmonary Hygiene  Mobilize per Surgery IS Maintain sats > 94% Will need outpatient follow up for ILD Lasix 40mg  IV x 1 4/10  CARDIOVASCULAR A:  Hemorrhagic Shock in setting of Ruptured Aortic Aneurysm s/p stent placement  S/P Bilateral Thrombectomy  Pericarditis  Concern for Bacterial Endocarditis  H/O HTN, HLD  P:  CTA from 4/9 was reviewed by Dr. Trula Slade which shows thrombus in the right limb of the graft.  Endo leak at the IMA Type 2. Suggests NOT to return to OR for surgical repair at this time.  Cardiology Following.  Holding off on TEE for now. No veg on TTE Maintain off pressors as able  Continue abx as below   RENAL A:   Chronic Kidney Disease Stage 3  Creatinine trending down Lactic acidosis - resolved.  Hypokalemia Hypophos P:   Trend BMP./ Urine output   Replace electrolytes as  indicated Avoid Nephrotoxic medications  Trend CK    GASTROINTESTINAL A:   SUP  P:   Ok for diet - advance as tol as long as stroke swallow screen done  PPI    HEMATOLOGIC A:   Acute blood loss anemia secondary to ruptured aortic aneurysm and retroperitoneal bleed. P:  holding anticoagulation - will defer to [facility] as to when ok to resume   Monitor CBC every 6 hours. SCD's   INFECTIOUS A:   Pnemococcal Bacteremia concerning for CNS involvement possible endocarditis. Leukocytosis P:   ID following > Rocephin  Trend WBC and Fever Curve  Follow repeat cultures  Will need TEE - holding for now per cards  Trend pct   ENDOCRINE A:   No issues  P:   Trend Glucose CBG's   NEUROLOGIC A:   Left Cerebellar Infarcts (Believed due to Septic Emboli)  MRA with Chronic Right M1 Occlusion, Bilateral PCA Stenosis and Chronic Abdominal Right Vertebral Artery Stenosis  MRI with old bilateral basal ganglia lacunar infarcts  Awake and alert, following commands, MAE x 4 P:   Neurology Following  Stroke swallow screen  Mobilize when ok with [facility]   FAMILY   - Inter-disciplinary family meet or Palliative Care meeting due by: 07/08/2017  - pt and family updated 4/10  Will tx to SDU.   Will ask [facility] to resume care 4/11.   Nickolas Madrid, NP 07/04/2017  10:02 AM Pager: [phone] or [phone]

## 2017-07-04 NOTE — Progress Notes (Addendum)
  Progress Note    07/04/2017 8:31 AM 3 Days Post-Op  Subjective:  C/o his abdomen hurting and his right leg being numb  Afebrile x 24 hrs HR 50's-70's NSR 580'[id number]'J systolic 82% [id number]  Vitals:   07/04/17 0600 07/04/17 0700  BP: (!) 171/77 (!) 167/81  Pulse: 61 (!) 59  Resp: (!) 24 18  Temp:  98.2 F (36.8 C)  SpO2: 94% 91%    Physical Exam: Cardiac:  regular Lungs:  Non labored Incisions:  Bilateral groin incisions are clean and dry Extremities:  monophasic doppler signals right DP/PT and brisk biphasic doppler signals left DP/PT; sensation is in tact both feet as he closed his eyes and was able to tell me which foot I was touching.  Motor in tact. Abdomen:  Soft; non tender to palpation; excellent bowel sounds;   CBC    Component Value Date/Time   WBC 15.1 (H) 07/04/2017 0359   RBC 3.78 (L) 07/04/2017 0359   HGB 9.9 (L) 07/04/2017 0359   HCT 28.8 (L) 07/04/2017 0359   PLT 158 07/04/2017 0359   MCV 76.2 (L) 07/04/2017 0359   MCV 77.8 (A) 06/28/2017 1335   MCH 26.2 07/04/2017 0359   MCHC 34.4 07/04/2017 0359   RDW 14.5 07/04/2017 0359   LYMPHSABS 1.9 07/03/2017 1854   MONOABS 1.3 (H) 07/03/2017 1854   EOSABS 0.0 07/03/2017 1854   BASOSABS 0.0 07/03/2017 1854    BMET    Component Value Date/Time   NA 138 07/04/2017 0359   K 3.2 (L) 07/04/2017 0359   CL 102 07/04/2017 0359   CO2 26 07/04/2017 0359   GLUCOSE 158 (H) 07/04/2017 0359   BUN 22 (H) 07/04/2017 0359   CREATININE 1.22 07/04/2017 0359   CALCIUM 7.3 (L) 07/04/2017 0359   GFRNONAA >60 07/04/2017 0359   GFRAA >60 07/04/2017 0359    INR    Component Value Date/Time   INR 1.04 07/01/2017 1632     Intake/Output Summary (Last 24 hours) at 07/04/2017 0831 Last data filed at 07/04/2017 0700 Gross per 24 hour  Intake 2625 ml  Output 2500 ml  Net 125 ml     Assessment:  62 y.o. male is s/p:  EVAR for ruptured AAA and bilateral open CFA exposure and Bilateral iliofemoral endarterectomy and  Bilateral iliac, femoral, superficial femoral, and popliteal artery thromboembolectomy               3 Days Post-Op  Plan: -pt with monophasic doppler signals right DP/PT and brisk biphasic doppler signals left DP/PT;  Pt c/o numbness in right leg but this is not any different from when he got out of surgery.  When he closes his eyes, he can tell me when I am touching his feet and which one.  His motor is in tact.  Bilateral feet are warm. -abdomen is soft with excellent bowel sounds but denies any flatus -acute blood loss anemia is stable from yesterday -Ceftriaxone per ID for pneumococcal bacteremia, pericarditis pneumonia, infarcts in the CNS -cardiology holding on TEE until his medical status has improved -creatinine stable -DVT prophylaxis:  SCD's   Leontine Locket, PA-C [facility] [phone] 07/04/2017 8:31 AM  I agree with the above.  Right LE sensory deficits improved.  He continues to have palpable pedal pulses.  Unable to explain sensory loss. Continue to monitor Hb. Needs protracted abx course as he is at risk for aneurysm graft infection given infectious issues. Add ASA.  Annamarie Major

## 2017-07-04 NOTE — Progress Notes (Signed)
Subjective:  Complaining of abdominal pain and distention still denies chest pain Antibiotics:  Anti-infectives (From admission, onward)   Start     Dose/Rate Route Frequency Ordered Stop   06/30/17 1600  cefTRIAXone (ROCEPHIN) 2 g in sodium chloride 0.9 % 100 mL IVPB     2 g 200 mL/hr over 30 Minutes Intravenous Every 12 hours 06/30/17 1354     06/29/17 2200  azithromycin (ZITHROMAX) tablet 500 mg  Status:  Discontinued     500 mg Oral Daily at bedtime 06/29/17 1159 06/29/17 1454   06/29/17 1500  cefTRIAXone (ROCEPHIN) 2 g in sodium chloride 0.9 % 100 mL IVPB  Status:  Discontinued     2 g 200 mL/hr over 30 Minutes Intravenous Every 24 hours 06/29/17 1454 06/30/17 1354   06/28/17 2000  cefTRIAXone (ROCEPHIN) 1 g in sodium chloride 0.9 % 100 mL IVPB  Status:  Discontinued     1 g 200 mL/hr over 30 Minutes Intravenous Every 24 hours 06/28/17 1839 06/29/17 1454   06/28/17 2000  azithromycin (ZITHROMAX) 500 mg in sodium chloride 0.9 % 250 mL IVPB  Status:  Discontinued     500 mg 250 mL/hr over 60 Minutes Intravenous Every 24 hours 06/28/17 1839 06/29/17 1159      Medications: Scheduled Meds: . atorvastatin  40 mg Oral q1800  . Chlorhexidine Gluconate Cloth  6 each Topical Daily  . fentaNYL (SUBLIMAZE) injection  50 mcg Intravenous Once  . hydrocortisone sod succinate (SOLU-CORTEF) inj  50 mg Intravenous Q12H  . insulin aspart  1-3 Units Subcutaneous Q6H  . pantoprazole (PROTONIX) IV  40 mg Intravenous Daily  . senna-docusate  2 tablet Oral QHS   Continuous Infusions: . cefTRIAXone (ROCEPHIN)  IV Stopped (07/04/17 0423)  . potassium chloride 10 mEq (07/04/17 1148)   PRN Meds:.acetaminophen (TYLENOL) oral liquid 160 mg/5 mL **OR** acetaminophen, iopamidol, magnesium citrate, ondansetron (ZOFRAN) IV, simethicone, sodium chloride flush, sorbitol    Objective: Weight change: -1 lb 5.2 oz (-0.6 kg)  Intake/Output Summary (Last 24 hours) at 07/04/2017 1156 Last data  filed at 07/04/2017 1148 Gross per 24 hour  Intake 2860 ml  Output 2885 ml  Net -25 ml   Blood pressure 134/66, pulse (!) 56, temperature 98.2 F (36.8 C), temperature source Oral, resp. rate (!) 22, height 5\' 5"  (1.651 m), weight 142 lb 6.7 oz (64.6 kg), SpO2 100 %. Temp:  [97.9 F (36.6 C)-98.7 F (37.1 C)] 98.2 F (36.8 C) (04/10 0700) Pulse Rate:  [31-74] 56 (04/10 1100) Resp:  [18-36] 22 (04/10 1100) BP: (107-171)/(66-92) 134/66 (04/10 1100) SpO2:  [90 %-100 %] 100 % (04/10 1100) Weight:  [142 lb 6.7 oz (64.6 kg)] 142 lb 6.7 oz (64.6 kg) (04/10 0500)  Physical Exam: General: Alert and awake, third and conversant HEENT: anicteric sclera,  EOMI CVS regular rate, normal r,  no murmur rubs or gallops Chest: Fairly clear to auscultation anteriorly  abdomen: soft distended and diffusely tender normal bowel sounds, Extremities: no  clubbing or edema noted bilaterally Skin: Surgical wounds are clean Neuro: No new focal deficits  CBC:  CBC Latest Ref Rng & Units 07/04/2017 07/03/2017 07/03/2017  WBC 4.0 - 10.5 K/uL 15.1(H) 15.7(H) 15.8(H)  Hemoglobin 13.0 - 17.0 g/dL 9.9(L) 10.3(L) 7.7(L)  Hematocrit 39.0 - 52.0 % 28.8(L) 35.5(L) 22.6(L)  Platelets 150 - 400 K/uL 158 204 155     BMET Recent Labs    07/03/17 1854 07/04/17 0359  NA 138 138  K 3.1* 3.2*  CL 102 102  CO2 25 26  GLUCOSE 173* 158*  BUN 22* 22*  CREATININE 1.23 1.22  CALCIUM 7.2* 7.3*     Liver Panel  Recent Labs    07/01/17 1632 07/04/17 0359  PROT 6.4* 5.7*  ALBUMIN 2.4* 2.1*  AST 40 151*  ALT 27 50  ALKPHOS 81 92  BILITOT 1.2 0.6       Sedimentation Rate No results for input(s): ESRSEDRATE in the last 72 hours. C-Reactive Protein No results for input(s): CRP in the last 72 hours.  Micro Results: Recent Results (from the past 720 hour(s))  Culture, blood (Routine X 2) w Reflex to ID Panel     Status: Abnormal   Collection Time: 06/28/17  9:10 PM  Result Value Ref Range Status    Specimen Description   Final    BLOOD RIGHT ANTECUBITAL Performed at [facility] [address], [place], [place] [address]    Special Requests   Final    BOTTLES DRAWN AEROBIC AND ANAEROBIC Blood Culture adequate volume Performed at [facility] [address], [place], [place] [address]    Culture  Setup Time   Final    GRAM POSITIVE COCCI IN CHAINS IN BOTH AEROBIC AND ANAEROBIC BOTTLES CRITICAL VALUE NOTED.  VALUE IS CONSISTENT WITH PREVIOUSLY REPORTED AND CALLED VALUE.    Culture (A)  Final    STREPTOCOCCUS PNEUMONIAE SUSCEPTIBILITIES PERFORMED ON PREVIOUS CULTURE WITHIN THE LAST 5 DAYS. Performed at [facility], [place] [address], [place], [place] [address]    Report Status 07/01/2017 FINAL  Final  Culture, blood (Routine X 2) w Reflex to ID Panel     Status: Abnormal   Collection Time: 06/28/17  9:10 PM  Result Value Ref Range Status   Specimen Description   Final    BLOOD LEFT HAND Performed at [facility] [address], [place], [place] [address]    Special Requests   Final    BOTTLES DRAWN AEROBIC AND ANAEROBIC Blood Culture adequate volume Performed at [facility] [address], [place], [place] [address]    Culture  Setup Time   Final    GRAM POSITIVE COCCI IN CHAINS IN BOTH AEROBIC AND ANAEROBIC BOTTLES CRITICAL RESULT CALLED TO, READ BACK BY AND VERIFIED WITH: D. Wofford PharmD 14:45 06/29/17 ([email or web address]) Performed at [facility], [place] [address], [place], [place] [address]    Culture STREPTOCOCCUS PNEUMONIAE (A)  Final   Report Status 07/01/2017 FINAL  Final   Organism ID, Bacteria STREPTOCOCCUS PNEUMONIAE  Final      Susceptibility   Streptococcus pneumoniae - MIC*    ERYTHROMYCIN <=0.12 SENSITIVE Sensitive     LEVOFLOXACIN 0.5 SENSITIVE Sensitive     PENICILLIN (meningitis) 0.25 RESISTANT Resistant     PENICILLIN (non-meningitis) 0.25 SENSITIVE Sensitive      CEFTRIAXONE (non-meningitis) <=0.12 SENSITIVE Sensitive     CEFTRIAXONE (meningitis) <=0.12 SENSITIVE Sensitive     * STREPTOCOCCUS PNEUMONIAE  Blood Culture ID Panel (Reflexed)     Status: Abnormal   Collection Time: 06/28/17  9:10 PM  Result Value Ref Range Status   Enterococcus species NOT DETECTED NOT DETECTED Final   Listeria monocytogenes NOT DETECTED NOT DETECTED Final   Staphylococcus species NOT DETECTED NOT DETECTED Final   Staphylococcus aureus NOT DETECTED NOT DETECTED Final   Streptococcus species DETECTED (A) NOT DETECTED Final    Comment: CRITICAL RESULT CALLED TO, READ BACK  BY AND VERIFIED WITH: D. Wofford PharmD 14:45 06/29/17 ([email or web address])    Streptococcus agalactiae NOT DETECTED NOT DETECTED Final   Streptococcus pneumoniae DETECTED (A) NOT DETECTED Final    Comment: CRITICAL RESULT CALLED TO, READ BACK BY AND VERIFIED WITH: D. Wofford PharmD 14:45 06/29/17 ([email or web address])    Streptococcus pyogenes NOT DETECTED NOT DETECTED Final   Acinetobacter baumannii NOT DETECTED NOT DETECTED Final   Enterobacteriaceae species NOT DETECTED NOT DETECTED Final   Enterobacter cloacae complex NOT DETECTED NOT DETECTED Final   Escherichia coli NOT DETECTED NOT DETECTED Final   Klebsiella oxytoca NOT DETECTED NOT DETECTED Final   Klebsiella pneumoniae NOT DETECTED NOT DETECTED Final   Proteus species NOT DETECTED NOT DETECTED Final   Serratia marcescens NOT DETECTED NOT DETECTED Final   Haemophilus influenzae NOT DETECTED NOT DETECTED Final   Neisseria meningitidis NOT DETECTED NOT DETECTED Final   Pseudomonas aeruginosa NOT DETECTED NOT DETECTED Final   Candida albicans NOT DETECTED NOT DETECTED Final   Candida glabrata NOT DETECTED NOT DETECTED Final   Candida krusei NOT DETECTED NOT DETECTED Final   Candida parapsilosis NOT DETECTED NOT DETECTED Final   Candida tropicalis NOT DETECTED NOT DETECTED Final    Comment: Performed at [facility], [place] [address] [place], [place] [address]    Culture, blood (routine x 2)     Status: None (Preliminary result)   Collection Time: 06/30/17  2:18 PM  Result Value Ref Range Status   Specimen Description BLOOD RIGHT ARM  Final   Special Requests   Final    BOTTLES DRAWN AEROBIC AND ANAEROBIC Blood Culture adequate volume   Culture   Final    NO GROWTH 3 DAYS Performed at [facility], [address] [place], [place] [address]    Report Status PENDING  Incomplete  Culture, blood (routine x 2)     Status: None (Preliminary result)   Collection Time: 06/30/17  2:23 PM  Result Value Ref Range Status   Specimen Description BLOOD RIGHT FOREARM  Final   Special Requests   Final    BOTTLES DRAWN AEROBIC AND ANAEROBIC Blood Culture adequate volume   Culture   Final    NO GROWTH 3 DAYS Performed at [facility], [place] [address] [place], [place] [address]    Report Status PENDING  Incomplete  MRSA PCR Screening     Status: None   Collection Time: 07/01/17 11:42 PM  Result Value Ref Range Status   MRSA by PCR NEGATIVE NEGATIVE Final    Comment:        The GeneXpert MRSA Assay (FDA approved for NASAL specimens only), is one component of a comprehensive MRSA colonization surveillance program. It is not intended to diagnose MRSA infection nor to guide or monitor treatment for MRSA infections. Performed at [facility], [place] [address] [place], [place] [address]   Culture, respiratory (NON-Expectorated)     Status: None   Collection Time: 07/02/17  4:45 AM  Result Value Ref Range Status   Specimen Description TRACHEAL ASPIRATE  Final   Special Requests NONE  Final   Gram Stain   Final    FEW WBC PRESENT, PREDOMINANTLY PMN FEW GRAM NEGATIVE RODS FEW GRAM POSITIVE COCCI IN PAIRS RARE GRAM NEGATIVE COCCOBACILLI    Culture   Final    RARE Consistent with normal respiratory flora. Performed at [facility], [place] [address], [place], [place] [address]    Report Status 07/04/2017 FINAL  Final     Studies/Results: Dg Chest Port 1 View  Result Date: 07/04/2017 CLINICAL DATA:  Respiratory failure EXAM: PORTABLE CHEST 1 VIEW COMPARISON:  07/02/2017 FINDINGS: Endotracheal tube is been removed in the interval. Right jugular central line is again seen and stable. Chronic fibrotic changes are again identified in both lungs. Improved aeration is noted in the bases bilaterally when compared with the prior exam. No bony abnormality is noted. IMPRESSION: Improved aeration in the bases bilaterally. Some chronic fibrotic changes are again identified and stable. No new focal abnormality is seen. Electronically Signed   By: Inez Catalina M.D.   On: 07/04/2017 08:58   Ct Angio Chest/abd/pel For Dissection W And/or W/wo  Result Date: 07/03/2017 CLINICAL DATA:  62 year old with an abdominal aortic aneurysm and abdominal hematoma. Follow-up aneurysm. Patient is hypotensive and concern for new or increased intra-abdominal bleeding. EXAM: CT ANGIOGRAPHY CHEST, ABDOMEN AND PELVIS TECHNIQUE: Multidetector CT imaging through the chest, abdomen and pelvis was performed using the standard protocol during bolus administration of intravenous contrast. Multiplanar reconstructed images and MIPs were obtained and reviewed to evaluate the vascular anatomy. CONTRAST:  69mL ISOVUE-370 IOPAMIDOL (ISOVUE-370) INJECTION 76%, <See Chart> ISOVUE-370 IOPAMIDOL (ISOVUE-370) INJECTION 76% COMPARISON:  CT 07/01/2017 FINDINGS: CTA CHEST FINDINGS Cardiovascular: Coronary artery calcifications. The size of the ascending thoracic aorta is better calculated on the noncontrast images due to peripheral mural thrombus. The ascending thoracic aorta measures at least 4.4 cm. Evidence for irregular mural thrombus along the posterolateral wall of the ascending thoracic aorta. There is also eccentric thrombus at the aortic arch. Difficult to measure the true lumen of the descending thoracic aorta due to low-density material along the left side of the  descending thoracic aorta which could represent mural thrombus and/or edema in this area. No clear evidence for intramural hematoma or dissection involving the thoracic aorta. Heart size is within normal limits. No significant pericardial fluid. Main and central pulmonary arteries are patent. Great vessels are patent. There is irregular plaque involving the proximal left subclavian artery. Mediastinum/Nodes: Gas adjacent to the right internal jugular vein compatible with recent catheterization. Low-density material throughout the mediastinum compatible with edema. Question a precarinal lymph node measuring 1.7 cm in the short axis on sequence 8, image 54. There may be a few prominent prevascular lymph nodes. Lungs/Pleura: Trace bilateral pleural fluid. Centrilobular emphysema. A small amount of mucus in the mainstem bronchi. Diffuse patchy airspace disease with areas of interstitial thickening. Findings are suggestive for pulmonary edema but cannot exclude widespread infectious or inflammatory process. Musculoskeletal: No acute abnormality. Review of the MIP images confirms the above findings. CTA ABDOMEN AND PELVIS FINDINGS VASCULAR Aorta: Endovascular repair of the abdominal aortic aneurysm. Stent graft is positioned below the renal veins. The main body of the stent graft is patent but there is a high-grade stenosis involving the right graft limb. Stenosis measures roughly 60-70%. Stenosis is related to a large amount of thrombus within the stent limb. The left stent limb is widely patent. There is pocket of gas anterior to the right limb and probably related to recent surgery. Aneurysm sac has minimally changed in size measuring 6.5 x 5.9 cm and previously measuring 6.6 x 6.0 cm. Limited evaluation for endoleak due to the lack of precontrast images through the abdominal aorta and the lack of delayed images. However, there is contrast in the IMA up to the origin and patient could be at risk for a type 2 endoleak.  Celiac: Patent without evidence of aneurysm, dissection, vasculitis  or significant stenosis. SMA: SMA is patent with mild narrowing in the proximal aspect. No evidence for dissection or aneurysm. Renals: Single right renal artery with irregularity along the proximal aspect. Findings compatible with mild-to-moderate stenosis approximately 1 cm from the right renal artery origin. There are at least 2 left renal arteries which are patent. No evidence for dissection or aneurysm involving the left renal arteries. IMA: There is contrast within the IMA including near the origin. IMA could be associated with a type 2 endoleak but difficult to characterize on these images. Inflow: Thrombus within the right graft limb as described in the aorta section. Stent graft terminates in the right common iliac artery. There is thrombus along the distal aspect of the right limb graft and thrombus at the right iliac artery bifurcation thrombus extending into the right hypogastric artery. Right external iliac artery is patent. There appears to be a small amount of thrombus in the proximal right SFA. Left limb graft is patent and terminates in the left common iliac artery. No significant thrombus or stenosis in the left limb graft. The left hypogastric artery is patent. Left external iliac artery is patent. Proximal left femoral arteries are patent. Veins: Limited evaluation on this arterial phase of contrast. Review of the MIP images confirms the above findings. NON-VASCULAR Hepatobiliary: No gross abnormality to the liver. High-density material in the gallbladder. Pancreas: No gross abnormality to the pancreas. Spleen: Limited evaluation on this arterial phase of contrast. A small amount of fluid or stranding along the lateral aspect of the spleen which is similar to the prior examination. Adrenals/Urinary Tract: Again noted is a hyperdense nodule involving the medial left adrenal gland that measures roughly 1.4 cm. Right adrenal gland  appears to be unremarkable. There is a cyst in the right kidney upper pole. Areas of cortical scarring and thinning in the right kidney. Mild fullness of the right renal collecting system. Urinary bladder is decompressed with a Foley catheter. Probable small cyst in the left kidney. Mild fullness in the left renal collecting system and proximal left ureter. Stomach/Bowel: Oral contrast within the colon. No acute abnormalities in stomach, small bowel or large bowel. Appendix has oral contrast without acute inflammatory changes. Lymphatic: Limited evaluation for lymphadenopathy due to the retroperitoneal blood and edema. There does not appear to be significant lymph node enlargement. Reproductive: Prostate is unremarkable. Other: Again noted is a large amount of blood in the left retroperitoneum, lateral and adjacent to the left iliopsoas muscle. The hematoma lateral to the left psoas muscle on sequence 8, image 205 measures 7.0 x 6.0 cm and previously measured 8.9 x 5.6 cm on the recent comparison examination. Hematoma adjacent to the left iliopsoas muscle in the left hemipelvis roughly measures 7.8 x 5.6 cm on sequence 8, image 247 and measured 8.5 x 7.6 cm on the recent comparison examination. Patient now has diffuse subcutaneous edema. There is gas in left groin related to recent surgery. Increased edema in the presacral region. Gas along the lower right anterior abdominal wall compatible with recent surgery. Musculoskeletal: No acute abnormality. Review of the MIP images confirms the above findings. IMPRESSION: 1. Left retroperitoneal hematoma has decreased in size and no evidence for new or increased retroperitoneal bleeding. 2. Endovascular repair of the abdominal aortic aneurysm. Gas within the abdominal aortic sac probably represents postoperative changes. Patient is at risk for a type 2 endoleak from the IMA but difficult to characterize on this examination and recommend attention on follow up imaging. 3.  Thrombus  in the right stent graft limb causing 60-70% narrowing of the lumen. Thrombus in the distal common iliac artery and at the iliac artery bifurcation. 4. Severe atherosclerotic disease involving the thoracic aorta. Thrombus within the right graft limb could be related to the recent surgery but patient is also at risk for embolic disease from the thoracic aorta. Right renal artery stenosis. 5. Aneurysm of the ascending thoracic aorta measuring at least 4.4 cm. No evidence for an thoracic aortic dissection. Recommend annual imaging followup by CTA or MRA. This recommendation follows 2010 ACCF/AHA/AATS/ACR/ASA/SCA/SCAI/SIR/STS/SVM Guidelines for the Diagnosis and Management of Patients with Thoracic Aortic Disease. Circulation. 2010; 121: e266-e369 6. Small bilateral effusions with diffuse bilateral lung densities. Findings are suggestive for pulmonary edema but cannot exclude infection. 7. Indeterminate left adrenal nodule. Recommend attention on follow up imaging. These results were called by telephone at the time of interpretation on 07/03/2017 at 11:18 am to Dr. Harold Barban , who verbally acknowledged these results. Electronically Signed   By: Markus Daft M.D.   On: 07/03/2017 11:53      Assessment/Plan:  INTERVAL HISTORY:  T angiogram shows 1. Left retroperitoneal hematoma has decreased in size and no evidence for new or increased retroperitoneal bleeding.  2. Endovascular repair of the abdominal aortic aneurysm. Gas within the abdominal aortic sac probably represents postoperative changes. Patient is at risk for a type 2 endoleak from the IMA but difficult to characterize on this examination and recommend attention on follow up imaging.  3. Thrombus in the right stent graft limb causing 60-70% narrowing of the lumen. Thrombus in the distal common iliac artery and at the iliac artery bifurcation.  4. Severe atherosclerotic disease involving the thoracic aorta. Thrombus within the  right graft limb could be related to the recent surgery but patient is also at risk for embolic disease from the thoracic aorta. Right renal artery stenosis.  5. Aneurysm of the ascending thoracic aorta measuring at least 4.4 cm. No evidence for an thoracic aortic dissection   Principal Problem:   Ruptured abdominal aortic aneurysm (AAA) (HCC) Active Problems:   Chest pain   CAP (community acquired pneumonia): Clinical   Acute renal failure superimposed on stage 3 chronic kidney disease (HCC)   Dehydration   Leukocytosis   Elevated troponin   Dizziness   Vertigo   Acute pericarditis: Probable   Bacteremia due to Streptococcus   CVA (cerebral vascular accident) ([place])   LLQ abdominal pain   Hypotension   Hemorrhagic shock (HCC)   Acute blood loss anemia   S/P AAA repair   Sepsis due to Streptococcus pneumoniae ([facility])   Acute respiratory failure with hypoxia (HCC)    Thomas [name] is a 62 y.o. male with multiple medical problems now with pneumococcal bacteremia, pericarditis pneumonia, infarcts in the CNS and a ruptured abdominal aortic aneurysm.  He was taken to the operating room last night and underwent  Endovascular repair of ruptured abdominal aortic aneurysm. Bilateral ultrasound-guided common femoral artery access Bilateral iliofemoral endarterectomy  Bilateral iliac, femoral, superficial femoral, and popliteal artery thromboembolectomy  Keep him on CNS dosing with ceftriaxone 2 g IV every 12 hours.  Cardiology holding off on transesophageal echocardiogram at this point which is not unreasonable given his clinical status   I would like to get a transesophageal echocardiogram when it is doable.  Will plan though on giving him protracted IV antibiotics in the form of ceftriaxone likely for 6 weeks.  Tentative plan is as below  Diagnosis: Pneumococcal bacteremia  and possible endovascular infection with CNS emboli,   Culture Result: Streptococcus pneumoniae No Known  Allergies  OPAT Orders Discharge antibiotics: Ceftriaxone 2 g IV every 12 hours  Duration: 6 weeks End Date:  Aug 12, 2017  [facility] Per Protocol:  Labs weekly while on IV antibiotics: _x_ CBC with differential _x_ BMP    x__ Please pull PIC at completion of IV antibiotics __ Please leave PIC in place until doctor has seen patient or been notified  Fax weekly labs to [phone]  Clinic Follow Up Appt:  Next 3- 4 weeks     LOS: 6 days   Alcide Evener 07/04/2017, 11:56 AM

## 2017-07-05 ENCOUNTER — Inpatient Hospital Stay: Payer: Self-pay

## 2017-07-05 ENCOUNTER — Encounter (HOSPITAL_COMMUNITY): Payer: Self-pay | Admitting: Surgery

## 2017-07-05 LAB — CBC
HCT: 31 % — ABNORMAL LOW (ref 39.0–52.0)
HEMOGLOBIN: 10.5 g/dL — AB (ref 13.0–17.0)
MCH: 26.1 pg (ref 26.0–34.0)
MCHC: 33.9 g/dL (ref 30.0–36.0)
MCV: 77.1 fL — ABNORMAL LOW (ref 78.0–100.0)
PLATELETS: 207 10*3/uL (ref 150–400)
RBC: 4.02 MIL/uL — AB (ref 4.22–5.81)
RDW: 14.8 % (ref 11.5–15.5)
WBC: 13.9 10*3/uL — ABNORMAL HIGH (ref 4.0–10.5)

## 2017-07-05 LAB — BASIC METABOLIC PANEL
Anion gap: 8 (ref 5–15)
BUN: 17 mg/dL (ref 6–20)
CALCIUM: 7.5 mg/dL — AB (ref 8.9–10.3)
CO2: 25 mmol/L (ref 22–32)
CREATININE: 1.21 mg/dL (ref 0.61–1.24)
Chloride: 105 mmol/L (ref 101–111)
Glucose, Bld: 121 mg/dL — ABNORMAL HIGH (ref 65–99)
Potassium: 3.5 mmol/L (ref 3.5–5.1)
SODIUM: 138 mmol/L (ref 135–145)

## 2017-07-05 LAB — CULTURE, BLOOD (ROUTINE X 2)
Culture: NO GROWTH
Culture: NO GROWTH
SPECIAL REQUESTS: ADEQUATE
SPECIAL REQUESTS: ADEQUATE

## 2017-07-05 LAB — CK
CK TOTAL: 2734 U/L — AB (ref 49–397)
Total CK: 2091 U/L — ABNORMAL HIGH (ref 49–397)

## 2017-07-05 LAB — GLUCOSE, CAPILLARY: GLUCOSE-CAPILLARY: 132 mg/dL — AB (ref 65–99)

## 2017-07-05 MED ORDER — ASPIRIN EC 81 MG PO TBEC
81.0000 mg | DELAYED_RELEASE_TABLET | Freq: Every day | ORAL | Status: DC
Start: 2017-07-05 — End: 2017-07-14
  Administered 2017-07-05 – 2017-07-14 (×10): 81 mg via ORAL
  Filled 2017-07-05 (×10): qty 1

## 2017-07-05 MED ORDER — PANTOPRAZOLE SODIUM 40 MG PO TBEC
40.0000 mg | DELAYED_RELEASE_TABLET | Freq: Every day | ORAL | Status: DC
  Administered 2017-07-06 – 2017-07-14 (×9): 40 mg via ORAL
  Filled 2017-07-05 (×9): qty 1

## 2017-07-05 MED ORDER — TRAMADOL HCL 50 MG PO TABS
50.0000 mg | ORAL_TABLET | Freq: Four times a day (QID) | ORAL | Status: DC | PRN
  Administered 2017-07-05 – 2017-07-14 (×11): 50 mg via ORAL
  Filled 2017-07-05 (×11): qty 1

## 2017-07-05 MED ORDER — ENOXAPARIN SODIUM 30 MG/0.3ML ~~LOC~~ SOLN
30.0000 mg | Freq: Two times a day (BID) | SUBCUTANEOUS | Status: DC
  Administered 2017-07-05 – 2017-07-14 (×19): 30 mg via SUBCUTANEOUS
  Filled 2017-07-05 (×19): qty 0.3

## 2017-07-05 MED ORDER — METOPROLOL TARTRATE 5 MG/5ML IV SOLN
5.0000 mg | Freq: Four times a day (QID) | INTRAVENOUS | Status: DC | PRN
  Administered 2017-07-05: 5 mg via INTRAVENOUS
  Filled 2017-07-05: qty 5

## 2017-07-05 MED ORDER — OXYCODONE HCL 5 MG PO TABS
5.0000 mg | ORAL_TABLET | Freq: Four times a day (QID) | ORAL | Status: DC | PRN
  Administered 2017-07-06 – 2017-07-07 (×5): 5 mg via ORAL
  Administered 2017-07-07: 10 mg via ORAL
  Administered 2017-07-07 – 2017-07-08 (×2): 5 mg via ORAL
  Administered 2017-07-09 – 2017-07-14 (×10): 10 mg via ORAL
  Filled 2017-07-05: qty 2
  Filled 2017-07-05: qty 1
  Filled 2017-07-05 (×2): qty 2
  Filled 2017-07-05: qty 1
  Filled 2017-07-05 (×3): qty 2
  Filled 2017-07-05: qty 1
  Filled 2017-07-05: qty 2
  Filled 2017-07-05 (×2): qty 1
  Filled 2017-07-05 (×5): qty 2
  Filled 2017-07-05 (×2): qty 1
  Filled 2017-07-05 (×2): qty 2

## 2017-07-05 MED ORDER — POTASSIUM CHLORIDE 10 MEQ/100ML IV SOLN
INTRAVENOUS | Status: AC
  Filled 2017-07-05: qty 100

## 2017-07-05 MED ORDER — HYDROCORTISONE NA SUCCINATE PF 100 MG IJ SOLR
50.0000 mg | Freq: Two times a day (BID) | INTRAMUSCULAR | Status: AC
  Administered 2017-07-05: 50 mg via INTRAVENOUS
  Filled 2017-07-05: qty 2

## 2017-07-05 NOTE — Progress Notes (Signed)
Progress Note  Patient Name: Thomas [name] Date of Encounter: 07/05/2017  Primary Cardiologist: Sanda Klein, MD   Subjective   Still complaining of some right-sided flank/abdominal discomfort, less severe.  He states that his leg is no longer numb.  No shortness of breath.  Inpatient Medications    Scheduled Meds: . atorvastatin  40 mg Oral q1800  . Chlorhexidine Gluconate Cloth  6 each Topical Daily  . fentaNYL (SUBLIMAZE) injection  50 mcg Intravenous Once  . hydrocortisone sod succinate (SOLU-CORTEF) inj  50 mg Intravenous Q12H  . insulin aspart  1-3 Units Subcutaneous Q6H  . pantoprazole (PROTONIX) IV  40 mg Intravenous Daily  . senna-docusate  2 tablet Oral QHS   Continuous Infusions: . cefTRIAXone (ROCEPHIN)  IV Stopped (07/05/17 0548)  . potassium chloride     PRN Meds: acetaminophen (TYLENOL) oral liquid 160 mg/5 mL **OR** acetaminophen, iopamidol, magnesium citrate, ondansetron (ZOFRAN) IV, oxyCODONE, simethicone, sodium chloride flush, sorbitol, traMADol   Vital Signs    Vitals:   07/05/17 0500 07/05/17 0600 07/05/17 0734 07/05/17 0800  BP:  (!) 163/89  (!) 155/82  Pulse:  65  (!) 50  Resp:  (!) 22  20  Temp:   98.5 F (36.9 C)   TempSrc:   Oral   SpO2:  99%  97%  Weight: 142 lb 12.8 oz (64.8 kg)     Height:        Intake/Output Summary (Last 24 hours) at 07/05/2017 0912 Last data filed at 07/05/2017 0900 Gross per 24 hour  Intake 1200 ml  Output 2630 ml  Net -1430 ml   Filed Weights   07/03/17 0500 07/04/17 0500 07/05/17 0500  Weight: 143 lb 11.8 oz (65.2 kg) 142 lb 6.7 oz (64.6 kg) 142 lb 12.8 oz (64.8 kg)    Telemetry    Sinus rhythm- Personally Reviewed  ECG    No new EKG- Personally Reviewed  Physical Exam   GEN: No acute distress.   Neck: No JVD Cardiac: RRR, no significant murmurs, rubs, or gallops.  Respiratory: Clear to auscultation bilaterally. GI: Soft, nontender, non-distended  MS: No edema; No deformity. Neuro:  Nonfocal    Psych: Normal affect   Labs    Chemistry Recent Labs  Lab 07/01/17 1632  07/04/17 0359 07/04/17 1554 07/05/17 0515  NA 132*   < > 138 138 138  K 4.0   < > 3.2* 2.9* 3.5  CL 105   < > 102 101 105  CO2 16*   < > 26 26 25   GLUCOSE 117*   < > 158* 136* 121*  BUN 23*   < > 22* 20 17  CREATININE 1.47*   < > 1.22 1.24 1.21  CALCIUM 7.9*   < > 7.3* 7.4* 7.5*  PROT 6.4*  --  5.7*  --   --   ALBUMIN 2.4*  --  2.1*  --   --   AST 40  --  151*  --   --   ALT 27  --  50  --   --   ALKPHOS 81  --  92  --   --   BILITOT 1.2  --  0.6  --   --   GFRNONAA 49*   < > >60 >60 >60  GFRAA 57*   < > >60 >60 >60  ANIONGAP 11   < > 10 11 8    < > = values in this interval not displayed.     Hematology  Recent Labs  Lab 07/03/17 1854 07/04/17 0359 07/05/17 0515  WBC 15.7* 15.1* 13.9*  RBC 4.01* 3.78* 4.02*  HGB 10.3* 9.9* 10.5*  HCT 35.5* 28.8* 31.0*  MCV 88.5 76.2* 77.1*  MCH 25.7* 26.2 26.1  MCHC 29.0* 34.4 33.9  RDW 26.1* 14.5 14.8  PLT 204 158 207    Cardiac Enzymes Recent Labs  Lab 06/28/17 1544 06/28/17 2120 06/29/17 0320 06/29/17 0904  TROPONINI 0.34* 0.45* 0.41* 0.34*    Recent Labs  Lab 06/28/17 1552  TROPIPOC 0.33*     BNPNo results for input(s): BNP, PROBNP in the last 168 hours.   DDimer No results for input(s): DDIMER in the last 168 hours.   Radiology    Dg Chest Port 1 View  Result Date: 07/04/2017 CLINICAL DATA:  Respiratory failure EXAM: PORTABLE CHEST 1 VIEW COMPARISON:  07/02/2017 FINDINGS: Endotracheal tube is been removed in the interval. Right jugular central line is again seen and stable. Chronic fibrotic changes are again identified in both lungs. Improved aeration is noted in the bases bilaterally when compared with the prior exam. No bony abnormality is noted. IMPRESSION: Improved aeration in the bases bilaterally. Some chronic fibrotic changes are again identified and stable. No new focal abnormality is seen. Electronically Signed   By: Inez Catalina M.D.   On: 07/04/2017 08:58   Ct Angio Chest/abd/pel For Dissection W And/or W/wo  Result Date: 07/03/2017 CLINICAL DATA:  62 year old with an abdominal aortic aneurysm and abdominal hematoma. Follow-up aneurysm. Patient is hypotensive and concern for new or increased intra-abdominal bleeding. EXAM: CT ANGIOGRAPHY CHEST, ABDOMEN AND PELVIS TECHNIQUE: Multidetector CT imaging through the chest, abdomen and pelvis was performed using the standard protocol during bolus administration of intravenous contrast. Multiplanar reconstructed images and MIPs were obtained and reviewed to evaluate the vascular anatomy. CONTRAST:  28mL ISOVUE-370 IOPAMIDOL (ISOVUE-370) INJECTION 76%, <See Chart> ISOVUE-370 IOPAMIDOL (ISOVUE-370) INJECTION 76% COMPARISON:  CT 07/01/2017 FINDINGS: CTA CHEST FINDINGS Cardiovascular: Coronary artery calcifications. The size of the ascending thoracic aorta is better calculated on the noncontrast images due to peripheral mural thrombus. The ascending thoracic aorta measures at least 4.4 cm. Evidence for irregular mural thrombus along the posterolateral wall of the ascending thoracic aorta. There is also eccentric thrombus at the aortic arch. Difficult to measure the true lumen of the descending thoracic aorta due to low-density material along the left side of the descending thoracic aorta which could represent mural thrombus and/or edema in this area. No clear evidence for intramural hematoma or dissection involving the thoracic aorta. Heart size is within normal limits. No significant pericardial fluid. Main and central pulmonary arteries are patent. Great vessels are patent. There is irregular plaque involving the proximal left subclavian artery. Mediastinum/Nodes: Gas adjacent to the right internal jugular vein compatible with recent catheterization. Low-density material throughout the mediastinum compatible with edema. Question a precarinal lymph node measuring 1.7 cm in the short axis on  sequence 8, image 54. There may be a few prominent prevascular lymph nodes. Lungs/Pleura: Trace bilateral pleural fluid. Centrilobular emphysema. A small amount of mucus in the mainstem bronchi. Diffuse patchy airspace disease with areas of interstitial thickening. Findings are suggestive for pulmonary edema but cannot exclude widespread infectious or inflammatory process. Musculoskeletal: No acute abnormality. Review of the MIP images confirms the above findings. CTA ABDOMEN AND PELVIS FINDINGS VASCULAR Aorta: Endovascular repair of the abdominal aortic aneurysm. Stent graft is positioned below the renal veins. The main body of the stent graft is patent but there is  a high-grade stenosis involving the right graft limb. Stenosis measures roughly 60-70%. Stenosis is related to a large amount of thrombus within the stent limb. The left stent limb is widely patent. There is pocket of gas anterior to the right limb and probably related to recent surgery. Aneurysm sac has minimally changed in size measuring 6.5 x 5.9 cm and previously measuring 6.6 x 6.0 cm. Limited evaluation for endoleak due to the lack of precontrast images through the abdominal aorta and the lack of delayed images. However, there is contrast in the IMA up to the origin and patient could be at risk for a type 2 endoleak. Celiac: Patent without evidence of aneurysm, dissection, vasculitis or significant stenosis. SMA: SMA is patent with mild narrowing in the proximal aspect. No evidence for dissection or aneurysm. Renals: Single right renal artery with irregularity along the proximal aspect. Findings compatible with mild-to-moderate stenosis approximately 1 cm from the right renal artery origin. There are at least 2 left renal arteries which are patent. No evidence for dissection or aneurysm involving the left renal arteries. IMA: There is contrast within the IMA including near the origin. IMA could be associated with a type 2 endoleak but difficult to  characterize on these images. Inflow: Thrombus within the right graft limb as described in the aorta section. Stent graft terminates in the right common iliac artery. There is thrombus along the distal aspect of the right limb graft and thrombus at the right iliac artery bifurcation thrombus extending into the right hypogastric artery. Right external iliac artery is patent. There appears to be a small amount of thrombus in the proximal right SFA. Left limb graft is patent and terminates in the left common iliac artery. No significant thrombus or stenosis in the left limb graft. The left hypogastric artery is patent. Left external iliac artery is patent. Proximal left femoral arteries are patent. Veins: Limited evaluation on this arterial phase of contrast. Review of the MIP images confirms the above findings. NON-VASCULAR Hepatobiliary: No gross abnormality to the liver. High-density material in the gallbladder. Pancreas: No gross abnormality to the pancreas. Spleen: Limited evaluation on this arterial phase of contrast. A small amount of fluid or stranding along the lateral aspect of the spleen which is similar to the prior examination. Adrenals/Urinary Tract: Again noted is a hyperdense nodule involving the medial left adrenal gland that measures roughly 1.4 cm. Right adrenal gland appears to be unremarkable. There is a cyst in the right kidney upper pole. Areas of cortical scarring and thinning in the right kidney. Mild fullness of the right renal collecting system. Urinary bladder is decompressed with a Foley catheter. Probable small cyst in the left kidney. Mild fullness in the left renal collecting system and proximal left ureter. Stomach/Bowel: Oral contrast within the colon. No acute abnormalities in stomach, small bowel or large bowel. Appendix has oral contrast without acute inflammatory changes. Lymphatic: Limited evaluation for lymphadenopathy due to the retroperitoneal blood and edema. There does not  appear to be significant lymph node enlargement. Reproductive: Prostate is unremarkable. Other: Again noted is a large amount of blood in the left retroperitoneum, lateral and adjacent to the left iliopsoas muscle. The hematoma lateral to the left psoas muscle on sequence 8, image 205 measures 7.0 x 6.0 cm and previously measured 8.9 x 5.6 cm on the recent comparison examination. Hematoma adjacent to the left iliopsoas muscle in the left hemipelvis roughly measures 7.8 x 5.6 cm on sequence 8, image 247 and measured 8.5 x  7.6 cm on the recent comparison examination. Patient now has diffuse subcutaneous edema. There is gas in left groin related to recent surgery. Increased edema in the presacral region. Gas along the lower right anterior abdominal wall compatible with recent surgery. Musculoskeletal: No acute abnormality. Review of the MIP images confirms the above findings. IMPRESSION: 1. Left retroperitoneal hematoma has decreased in size and no evidence for new or increased retroperitoneal bleeding. 2. Endovascular repair of the abdominal aortic aneurysm. Gas within the abdominal aortic sac probably represents postoperative changes. Patient is at risk for a type 2 endoleak from the IMA but difficult to characterize on this examination and recommend attention on follow up imaging. 3. Thrombus in the right stent graft limb causing 60-70% narrowing of the lumen. Thrombus in the distal common iliac artery and at the iliac artery bifurcation. 4. Severe atherosclerotic disease involving the thoracic aorta. Thrombus within the right graft limb could be related to the recent surgery but patient is also at risk for embolic disease from the thoracic aorta. Right renal artery stenosis. 5. Aneurysm of the ascending thoracic aorta measuring at least 4.4 cm. No evidence for an thoracic aortic dissection. Recommend annual imaging followup by CTA or MRA. This recommendation follows 2010 ACCF/AHA/AATS/ACR/ASA/SCA/SCAI/SIR/STS/SVM  Guidelines for the Diagnosis and Management of Patients with Thoracic Aortic Disease. Circulation. 2010; 121: e266-e369 6. Small bilateral effusions with diffuse bilateral lung densities. Findings are suggestive for pulmonary edema but cannot exclude infection. 7. Indeterminate left adrenal nodule. Recommend attention on follow up imaging. These results were called by telephone at the time of interpretation on 07/03/2017 at 11:18 am to Dr. Harold Barban , who verbally acknowledged these results. Electronically Signed   By: Markus Daft M.D.   On: 07/03/2017 11:53    Cardiac Studies   Echocardiogram from earlier this admission personally reviewed-there is mild aortic insufficiency but no evidence of vegetation.  There is no significant pericardial effusion.  Patient Profile     62 y.o. male with strep pneumonia bacteremia, AAA rupture status post repair, stroke  Assessment & Plan    Strep pneumonia bacteremia - Had discussion with patient, nursing team, infectious disease team, Dr. Drucilla Schmidt.  Percentage of strep pneumonia endocarditis is quoted at 3%, rare.  There is no evidence of pericarditis either on echocardiogram or clinically.  He does have mild aortic insufficiency but there is no evidence of vegetation on transthoracic echocardiogram.  There are no severe valvular lesions.  Given these findings and the fact that he would not be a candidate for open cardiothoracic surgery, we will forego transesophageal echocardiogram as it will not change strategy and management.  Regardless, he will undergo likely 6 weeks of IV antibiotics given his recent hardware placement for AAA repair endovascularly.  Please let us know if we can be of further assistance.  We will sign off.  Candee Furbish, MD   For questions or updates, please contact [facility] Please consult [email or web address] for contact info under Cardiology/STEMI.      Signed, Candee Furbish, MD  07/05/2017, 9:12 AM

## 2017-07-05 NOTE — Progress Notes (Signed)
Transferred patient and all of his belongings to [facility] [place] [facility] via the bed. Patient A/o x 4. Report given to [facility].

## 2017-07-05 NOTE — Progress Notes (Signed)
Peripherally Inserted Central Catheter/Midline Placement  The IV Nurse has discussed with the patient and/or persons authorized to consent for the patient, the purpose of this procedure and the potential benefits and risks involved with this procedure.  The benefits include less needle sticks, lab draws from the catheter, and the patient may be discharged home with the catheter. Risks include, but not limited to, infection, bleeding, blood clot (thrombus formation), and puncture of an artery; nerve damage and irregular heartbeat and possibility to perform a PICC exchange if needed/ordered by physician.  Alternatives to this procedure were also discussed.  Bard Power PICC patient education guide, fact sheet on infection prevention and patient information card has been provided to patient /or left at bedside.    PICC/Midline Placement Documentation  PICC Single Lumen 38/75/64 PICC Right Basilic 38 cm 0 cm (Active)  Indication for Insertion or Continuance of Line Prolonged intravenous therapies 07/05/2017  7:05 PM  Exposed Catheter (cm) 0 cm 07/05/2017  7:05 PM  Site Assessment Clean;Dry;Intact 07/05/2017  7:05 PM  Line Status Flushed;Saline locked;Blood return noted 07/05/2017  7:05 PM  Dressing Type Transparent;Securing device 07/05/2017  7:05 PM  Dressing Status Clean;Dry;Intact;Antimicrobial disc in place 07/05/2017  7:05 PM  Line Care Connections checked and tightened 07/05/2017  7:05 PM  Line Adjustment (NICU/IV Team Only) No 07/05/2017  7:05 PM  Dressing Intervention New dressing 07/05/2017  7:05 PM  Dressing Change Due 07/12/17 07/05/2017  7:05 PM       Ambrose Finland 07/05/2017, 7:28 PM

## 2017-07-05 NOTE — Progress Notes (Signed)
PHARMACY CONSULT NOTE FOR:  OUTPATIENT  PARENTERAL ANTIBIOTIC THERAPY (OPAT)  Indication: Pneumococcal bacteremia and possible endovascular infection with CNS emboli Regimen: Ceftriaxone 2g IV q12 hours End date: 08/12/17  IV antibiotic discharge orders are pended. To discharging provider:  please sign these orders via discharge navigator,  Select New Orders & click on the button choice - Manage This Unsigned Work.     Thank you for allowing pharmacy to be a part of this patient's care.  Erin Hearing PharmD., BCPS Clinical Pharmacist 07/05/2017 10:46 AM

## 2017-07-05 NOTE — Progress Notes (Signed)
    Subjective  - POD #4  Feeling a little better Right leg is less numb   Physical Exam:  Palpable pedal pulses bilaterally abd soft but mildly tender       Assessment/Plan:  POD #4  Slowly improving. No indication to explore right limb of EVAR graft,as vascular cause of numbness unlikely Continue IV abx Starting ASA and lovenox  [place] 07/05/2017 9:52 AM --  Vitals:   07/05/17 0734 07/05/17 0800  BP:  (!) 155/82  Pulse:  (!) 50  Resp:  20  Temp: 98.5 F (36.9 C)   SpO2:  97%    Intake/Output Summary (Last 24 hours) at 07/05/2017 0952 Last data filed at 07/05/2017 0900 Gross per 24 hour  Intake 1200 ml  Output 2630 ml  Net -1430 ml     Laboratory CBC    Component Value Date/Time   WBC 13.9 (H) 07/05/2017 0515   HGB 10.5 (L) 07/05/2017 0515   HCT 31.0 (L) 07/05/2017 0515   PLT 207 07/05/2017 0515    BMET    Component Value Date/Time   NA 138 07/05/2017 0515   K 3.5 07/05/2017 0515   CL 105 07/05/2017 0515   CO2 25 07/05/2017 0515   GLUCOSE 121 (H) 07/05/2017 0515   BUN 17 07/05/2017 0515   CREATININE 1.21 07/05/2017 0515   CALCIUM 7.5 (L) 07/05/2017 0515   GFRNONAA >60 07/05/2017 0515   GFRAA >60 07/05/2017 0515    COAG Lab Results  Component Value Date   INR 1.04 07/01/2017   INR 1.23 06/29/2017   No results found for: PTT  Antibiotics Anti-infectives (From admission, onward)   Start     Dose/Rate Route Frequency Ordered Stop   06/30/17 1600  cefTRIAXone (ROCEPHIN) 2 g in sodium chloride 0.9 % 100 mL IVPB     2 g 200 mL/hr over 30 Minutes Intravenous Every 12 hours 06/30/17 1354     06/29/17 2200  azithromycin (ZITHROMAX) tablet 500 mg  Status:  Discontinued     500 mg Oral Daily at bedtime 06/29/17 1159 06/29/17 1454   06/29/17 1500  cefTRIAXone (ROCEPHIN) 2 g in sodium chloride 0.9 % 100 mL IVPB  Status:  Discontinued     2 g 200 mL/hr over 30 Minutes Intravenous Every 24 hours 06/29/17 1454 06/30/17 1354   06/28/17  2000  cefTRIAXone (ROCEPHIN) 1 g in sodium chloride 0.9 % 100 mL IVPB  Status:  Discontinued     1 g 200 mL/hr over 30 Minutes Intravenous Every 24 hours 06/28/17 1839 06/29/17 1454   06/28/17 2000  azithromycin (ZITHROMAX) 500 mg in sodium chloride 0.9 % 250 mL IVPB  Status:  Discontinued     500 mg 250 mL/hr over 60 Minutes Intravenous Every 24 hours 06/28/17 1839 06/29/17 1159       V. Leia Alf, M.D. [facility] Office: [phone] Pager:  [phone]

## 2017-07-05 NOTE — Progress Notes (Signed)
Subjective:  Complaining of more pain on the left side now  Antibiotics:  Anti-infectives (From admission, onward)   Start     Dose/Rate Route Frequency Ordered Stop   06/30/17 1600  cefTRIAXone (ROCEPHIN) 2 g in sodium chloride 0.9 % 100 mL IVPB     2 g 200 mL/hr over 30 Minutes Intravenous Every 12 hours 06/30/17 1354     06/29/17 2200  azithromycin (ZITHROMAX) tablet 500 mg  Status:  Discontinued     500 mg Oral Daily at bedtime 06/29/17 1159 06/29/17 1454   06/29/17 1500  cefTRIAXone (ROCEPHIN) 2 g in sodium chloride 0.9 % 100 mL IVPB  Status:  Discontinued     2 g 200 mL/hr over 30 Minutes Intravenous Every 24 hours 06/29/17 1454 06/30/17 1354   06/28/17 2000  cefTRIAXone (ROCEPHIN) 1 g in sodium chloride 0.9 % 100 mL IVPB  Status:  Discontinued     1 g 200 mL/hr over 30 Minutes Intravenous Every 24 hours 06/28/17 1839 06/29/17 1454   06/28/17 2000  azithromycin (ZITHROMAX) 500 mg in sodium chloride 0.9 % 250 mL IVPB  Status:  Discontinued     500 mg 250 mL/hr over 60 Minutes Intravenous Every 24 hours 06/28/17 1839 06/29/17 1159      Medications: Scheduled Meds: . aspirin EC  81 mg Oral Daily  . atorvastatin  40 mg Oral q1800  . Chlorhexidine Gluconate Cloth  6 each Topical Daily  . enoxaparin (LOVENOX) injection  30 mg Subcutaneous Q12H  . hydrocortisone sod succinate (SOLU-CORTEF) inj  50 mg Intravenous Q12H  . pantoprazole  40 mg Oral Q1200  . senna-docusate  2 tablet Oral QHS   Continuous Infusions: . cefTRIAXone (ROCEPHIN)  IV Stopped (07/05/17 0548)  . potassium chloride     PRN Meds:.acetaminophen (TYLENOL) oral liquid 160 mg/5 mL **OR** acetaminophen, magnesium citrate, metoprolol tartrate, ondansetron (ZOFRAN) IV, oxyCODONE, simethicone, sodium chloride flush, sorbitol, traMADol    Objective: Weight change: 6.1 oz (0.174 kg)  Intake/Output Summary (Last 24 hours) at 07/05/2017 1037 Last data filed at 07/05/2017 0900 Gross per 24 hour  Intake  1200 ml  Output 2560 ml  Net -1360 ml   Blood pressure (!) 155/82, pulse (!) 50, temperature 98.5 F (36.9 C), temperature source Oral, resp. rate 20, height 5\' 5"  (1.651 m), weight 142 lb 12.8 oz (64.8 kg), SpO2 97 %. Temp:  [98.3 F (36.8 C)-98.8 F (37.1 C)] 98.5 F (36.9 C) (04/11 0734) Pulse Rate:  [50-68] 50 (04/11 0800) Resp:  [18-27] 20 (04/11 0800) BP: (134-164)/(66-94) 155/82 (04/11 0800) SpO2:  [93 %-100 %] 97 % (04/11 0800) Weight:  [142 lb 12.8 oz (64.8 kg)] 142 lb 12.8 oz (64.8 kg) (04/11 0500)  Physical Exam: General: Alert and awake, third and conversant HEENT: anicteric sclera,  EOMI CVS regular rate, normal r,  no murmur rubs or gallops Chest: Fairly clear to auscultation anteriorly  abdomen: soft distended, tender more on left side  Skin: Surgical wounds are clean Neuro: No new focal deficits  CBC:  CBC Latest Ref Rng & Units 07/05/2017 07/04/2017 07/03/2017  WBC 4.0 - 10.5 K/uL 13.9(H) 15.1(H) 15.7(H)  Hemoglobin 13.0 - 17.0 g/dL 10.5(L) 9.9(L) 10.3(L)  Hematocrit 39.0 - 52.0 % 31.0(L) 28.8(L) 35.5(L)  Platelets 150 - 400 K/uL 207 158 204     BMET Recent Labs    07/04/17 1554 07/05/17 0515  NA 138 138  K 2.9* 3.5  CL 101 105  CO2 26  25  GLUCOSE 136* 121*  BUN 20 17  CREATININE 1.24 1.21  CALCIUM 7.4* 7.5*     Liver Panel  Recent Labs    07/04/17 0359  PROT 5.7*  ALBUMIN 2.1*  AST 151*  ALT 50  ALKPHOS 92  BILITOT 0.6       Sedimentation Rate No results for input(s): ESRSEDRATE in the last 72 hours. C-Reactive Protein No results for input(s): CRP in the last 72 hours.  Micro Results: Recent Results (from the past 720 hour(s))  Culture, blood (Routine X 2) w Reflex to ID Panel     Status: Abnormal   Collection Time: 06/28/17  9:10 PM  Result Value Ref Range Status   Specimen Description   Final    BLOOD RIGHT ANTECUBITAL Performed at [facility] [address], [place], [place] [address]    Special  Requests   Final    BOTTLES DRAWN AEROBIC AND ANAEROBIC Blood Culture adequate volume Performed at [facility] [address] [place], [place] [address]    Culture  Setup Time   Final    GRAM POSITIVE COCCI IN CHAINS IN BOTH AEROBIC AND ANAEROBIC BOTTLES CRITICAL VALUE NOTED.  VALUE IS CONSISTENT WITH PREVIOUSLY REPORTED AND CALLED VALUE.    Culture (A)  Final    STREPTOCOCCUS PNEUMONIAE SUSCEPTIBILITIES PERFORMED ON PREVIOUS CULTURE WITHIN THE LAST 5 DAYS. Performed at [facility], [place] [address] [place], [place] [address]    Report Status 07/01/2017 FINAL  Final  Culture, blood (Routine X 2) w Reflex to ID Panel     Status: Abnormal   Collection Time: 06/28/17  9:10 PM  Result Value Ref Range Status   Specimen Description   Final    BLOOD LEFT HAND Performed at [facility] [address], [place], [place] [address]    Special Requests   Final    BOTTLES DRAWN AEROBIC AND ANAEROBIC Blood Culture adequate volume Performed at [facility] [address], [place], [place] [address]    Culture  Setup Time   Final    GRAM POSITIVE COCCI IN CHAINS IN BOTH AEROBIC AND ANAEROBIC BOTTLES CRITICAL RESULT CALLED TO, READ BACK BY AND VERIFIED WITH: D. Wofford PharmD 14:45 06/29/17 ([email or web address]) Performed at [facility], [place] [address], [place], [place] [address]    Culture STREPTOCOCCUS PNEUMONIAE (A)  Final   Report Status 07/01/2017 FINAL  Final   Organism ID, Bacteria STREPTOCOCCUS PNEUMONIAE  Final      Susceptibility   Streptococcus pneumoniae - MIC*    ERYTHROMYCIN <=0.12 SENSITIVE Sensitive     LEVOFLOXACIN 0.5 SENSITIVE Sensitive     PENICILLIN (meningitis) 0.25 RESISTANT Resistant     PENICILLIN (non-meningitis) 0.25 SENSITIVE Sensitive     CEFTRIAXONE (non-meningitis) <=0.12 SENSITIVE Sensitive     CEFTRIAXONE (meningitis) <=0.12 SENSITIVE Sensitive     * STREPTOCOCCUS PNEUMONIAE  Blood Culture ID Panel  (Reflexed)     Status: Abnormal   Collection Time: 06/28/17  9:10 PM  Result Value Ref Range Status   Enterococcus species NOT DETECTED NOT DETECTED Final   Listeria monocytogenes NOT DETECTED NOT DETECTED Final   Staphylococcus species NOT DETECTED NOT DETECTED Final   Staphylococcus aureus NOT DETECTED NOT DETECTED Final   Streptococcus species DETECTED (A) NOT DETECTED Final    Comment: CRITICAL RESULT CALLED TO, READ BACK BY AND VERIFIED WITH: D. Wofford PharmD 14:45 06/29/17 ([email or web address])    Streptococcus agalactiae NOT DETECTED NOT  DETECTED Final   Streptococcus pneumoniae DETECTED (A) NOT DETECTED Final    Comment: CRITICAL RESULT CALLED TO, READ BACK BY AND VERIFIED WITH: D. Wofford PharmD 14:45 06/29/17 ([email or web address])    Streptococcus pyogenes NOT DETECTED NOT DETECTED Final   Acinetobacter baumannii NOT DETECTED NOT DETECTED Final   Enterobacteriaceae species NOT DETECTED NOT DETECTED Final   Enterobacter cloacae complex NOT DETECTED NOT DETECTED Final   Escherichia coli NOT DETECTED NOT DETECTED Final   Klebsiella oxytoca NOT DETECTED NOT DETECTED Final   Klebsiella pneumoniae NOT DETECTED NOT DETECTED Final   Proteus species NOT DETECTED NOT DETECTED Final   Serratia marcescens NOT DETECTED NOT DETECTED Final   Haemophilus influenzae NOT DETECTED NOT DETECTED Final   Neisseria meningitidis NOT DETECTED NOT DETECTED Final   Pseudomonas aeruginosa NOT DETECTED NOT DETECTED Final   Candida albicans NOT DETECTED NOT DETECTED Final   Candida glabrata NOT DETECTED NOT DETECTED Final   Candida krusei NOT DETECTED NOT DETECTED Final   Candida parapsilosis NOT DETECTED NOT DETECTED Final   Candida tropicalis NOT DETECTED NOT DETECTED Final    Comment: Performed at [facility], [place] [address] [place], [place] [address]  Culture, blood (routine x 2)     Status: None (Preliminary result)   Collection Time: 06/30/17  2:18 PM  Result Value Ref Range Status   Specimen Description BLOOD  RIGHT ARM  Final   Special Requests   Final    BOTTLES DRAWN AEROBIC AND ANAEROBIC Blood Culture adequate volume   Culture   Final    NO GROWTH 4 DAYS Performed at [facility], [place] [address] [place], [place] [address]    Report Status PENDING  Incomplete  Culture, blood (routine x 2)     Status: None (Preliminary result)   Collection Time: 06/30/17  2:23 PM  Result Value Ref Range Status   Specimen Description BLOOD RIGHT FOREARM  Final   Special Requests   Final    BOTTLES DRAWN AEROBIC AND ANAEROBIC Blood Culture adequate volume   Culture   Final    NO GROWTH 4 DAYS Performed at [facility], [place] [address], [place], [place] [address]    Report Status PENDING  Incomplete  MRSA PCR Screening     Status: None   Collection Time: 07/01/17 11:42 PM  Result Value Ref Range Status   MRSA by PCR NEGATIVE NEGATIVE Final    Comment:        The GeneXpert MRSA Assay (FDA approved for NASAL specimens only), is one component of a comprehensive MRSA colonization surveillance program. It is not intended to diagnose MRSA infection nor to guide or monitor treatment for MRSA infections. Performed at [facility], [place] [address], [place], [place] [address]   Culture, respiratory (NON-Expectorated)     Status: None   Collection Time: 07/02/17  4:45 AM  Result Value Ref Range Status   Specimen Description TRACHEAL ASPIRATE  Final   Special Requests NONE  Final   Gram Stain   Final    FEW WBC PRESENT, PREDOMINANTLY PMN FEW GRAM NEGATIVE RODS FEW GRAM POSITIVE COCCI IN PAIRS RARE GRAM NEGATIVE COCCOBACILLI    Culture   Final    RARE Consistent with normal respiratory flora. Performed at [facility], [place] [address], [place], [place] [address]    Report Status 07/04/2017 FINAL  Final    Studies/Results: Dg Chest Port 1 View  Result Date: 07/04/2017 CLINICAL DATA:  Respiratory  failure EXAM: PORTABLE CHEST 1 VIEW COMPARISON:  07/02/2017 FINDINGS: Endotracheal  tube is been removed in the interval. Right jugular central line is again seen and stable. Chronic fibrotic changes are again identified in both lungs. Improved aeration is noted in the bases bilaterally when compared with the prior exam. No bony abnormality is noted. IMPRESSION: Improved aeration in the bases bilaterally. Some chronic fibrotic changes are again identified and stable. No new focal abnormality is seen. Electronically Signed   By: Inez Catalina M.D.   On: 07/04/2017 08:58      Assessment/Plan:  INTERVAL HISTORY:  No new sig events, TEE risk/benefit discussed with Dr. Marlou Porch  Principal Problem:   Ruptured abdominal aortic aneurysm (AAA) ([place]) Active Problems:   Chest pain   CAP (community acquired pneumonia): Clinical   Acute renal failure superimposed on stage 3 chronic kidney disease (HCC)   Dehydration   Leukocytosis   Elevated troponin   Dizziness   Vertigo   Acute pericarditis: Probable   Bacteremia due to Streptococcus   CVA (cerebral vascular accident) ([place])   LLQ abdominal pain   Hypotension   Hemorrhagic shock (HCC)   Acute blood loss anemia   S/P AAA repair   Sepsis due to Streptococcus pneumoniae ([place])   Acute respiratory failure with hypoxia (HCC)    Thomas [name] is a 62 y.o. male with multiple medical problems now with pneumococcal bacteremia, pericarditis pneumonia, infarcts in the CNS and a ruptured abdominal aortic aneurysm.  He was taken to the operating room last night and underwent  Endovascular repair of ruptured abdominal aortic aneurysm. Bilateral ultrasound-guided common femoral artery access Bilateral iliofemoral endarterectomy  Bilateral iliac, femoral, superficial femoral, and popliteal artery thromboembolectomy  Keep him on CNS dosing with ceftriaxone 2 g IV every 12 hours.  I think that we are unlikely to find significant endocarditis on transesophageal echocardiogram and I do not think it would change our management which would be  protracted antibiotics unless he was in need of cardiothoracic surgery which I do not think he would be a candidate for.  Therefore we will forego getting a TEE  Will plan though on giving him protracted IV antibiotics in the form of ceftriaxone for 6 weeks.  Tentative plan is as below  Diagnosis: Pneumococcal bacteremia and possible endovascular infection with CNS emboli,   Culture Result: Streptococcus pneumoniae No Known Allergies  OPAT Orders Discharge antibiotics: Ceftriaxone 2 g IV every 12 hours  Duration: 6 weeks End Date:  Aug 12, 2017  [facility] Per Protocol:  Labs weekly while on IV antibiotics: _x_ CBC with differential _x_ BMP    x__ Please pull PIC at completion of IV antibiotics __ Please leave PIC in place until doctor has seen patient or been notified  Fax weekly labs to [phone]  Clinic Follow Up Appt:  Next 3- 4 weeks  Will sign off for now please call with further questions.   LOS: 7 days   Thomas [name] 07/05/2017, 10:37 AM

## 2017-07-05 NOTE — Progress Notes (Addendum)
[facility] TEAM 1 - Stepdown/ICU TEAM  Thomas [name]  GGY:[id number] DOB: 12/28/1955 DOA: 06/28/2017 PCP: Patient, No Pcp Per    Brief Narrative:  62yo M w/ a hx of Tobacco Abuse, HTN, and CKD Stage 3 who presented to the ED on 4/4 with fever, chills, nonproductive cough, and chest pain. WBC 24.1. CXR with interstitial lung disease with pleural scarring. EKG with ST elevation in the anterior leads. Cardiology was consulted and felt this was due to acute pericarditis. Started on ASA. Placed on azithromycin and rocephin for probable clinical pneumonia. Admitted to [facility].  During this admission an MRI Brain noted 3 acute areas of CVA in the left cerebellum as well as evidence of old small bilateral cerebellar infarcts and old basal ganglia/thalamic infarcts.  Blood cultures returned positive for Pnemococcal bacteremia, raising a concern for bacterial endocarditis.   On 4/7 patient the pt developed diffuse abdominal pain with hypotension in the 70s. CT A/P noted a 6.6 cm diameter aortic aneurysm with findings concerning for rupture. He was taken to OR for endovascular repair of ruptured AAA.  In the PACU he was noted to have absent fem pulses and was taken back to OR for bilateral iliofemoral endarterectomy, BLE thromboembolectomies  Significant Events: 4/4 admit   4/7 acute AAA rupture - emergent repair in OR - return to OR w/ B LE thrombosis  4/11 [facility] assumed care  Subjective: Sleeping at time of my exam.  Awakens to report L lateral abdom pain, about the same as yesterday.  Denies cp, vomiting, sob, or HA.  Does report some nausea, and a poor appetite.    Assessment & Plan:  Ruptured Aortic Aneurysm s/p stent placement - large RPH  S/p emergent endovascular repair 4/7 w/ B iliofemoral endarterectomy and subsequent B LE thromboembolectomies - care per Vascular Surgery   S/P Bilateral LE Thrombectomies   CTA 4/9 shows thrombus in the right limb of the graft - Vascular directing care of this issue     Aneurysm of the ascending thoracic aorta measuring at least 4.4cm Noted on CT chest - will need serial f/u   Strep pneumo bacteremia - concern for Bacterial Endocarditis - Pericarditis  No vegetation on TTE - abx per ID - decision has been made to forgo TEE    LLL PNA - presumed Strep pneumo On abx coverage - f/u CXR suggests improvement   3 acute LEFT cerebellar infarcts  Pt reported recent dizziness at admit - septic emboli v/s hypotension - Neurology following - MRI also noted old small bilateral cerebellar infarcts + old basal ganglia and thalamus infarcts  RLE weakness and numbness - PVD more consistent with PVD per Neurology   Acute hypoxic respiratory failure  in setting hemorrhagic shock r/t ruptured AAA  extubated 4/8 - requiring on 2L [place] presently    ILD per CT will need outpatient follow up for ILD  Hemorrhagic Shock / Acute blood loss anemia in setting of Ruptured AAA Hgb stable/improving at this time  Recent Labs  Lab 07/03/17 0300 07/03/17 1854 07/04/17 0359 07/05/17 0515  HGB 7.7* 10.3* 9.9* 10.5*    Intracranial vascular occlusion / stenosis MRA noted right M1 occlusion, likely chronic, bilateral PCA stenosis, and terminal right vertebral artery stenosis - avoid hypotension   HTN Goal SBP 110-140 per Neuro recs - BP elevated today - adjust tx and follow   Indeterminate L adrenal nodule  Noted on CTa chest/abd/pelvis 4/9 - will need f/u imaging   HLD  On Lipitor  Chronic Kidney Disease Stage 3  Recent Labs  Lab 07/03/17 0300 07/03/17 1854 07/04/17 0359 07/04/17 1554 07/05/17 0515  CREATININE 1.32* 1.23 1.22 1.24 1.21     DVT prophylaxis: SCDs + lovenox  Code Status: FULL CODE Family Communication: no family present at time of exam  Disposition Plan: SDU  Consultants:  Vasc Surgery  PCCM ID  Neurology   Antimicrobials:  Azithromycin 4/4  Rocephin 4/4 >  Ancef 4/7   Objective: Blood pressure (!) 155/82, pulse (!) 50,  temperature 98.5 F (36.9 C), temperature source Oral, resp. rate 20, height 5\' 5"  (1.651 m), weight 64.8 kg (142 lb 12.8 oz), SpO2 97 %.  Intake/Output Summary (Last 24 hours) at 07/05/2017 0858 Last data filed at 07/05/2017 0523 Gross per 24 hour  Intake 1275 ml  Output 2360 ml  Net -1085 ml   Filed Weights   07/03/17 0500 07/04/17 0500 07/05/17 0500  Weight: 65.2 kg (143 lb 11.8 oz) 64.6 kg (142 lb 6.7 oz) 64.8 kg (142 lb 12.8 oz)    Examination: General: No acute respiratory distress Lungs: Clear to auscultation bilaterally without wheezes or crackles Cardiovascular: Regular rate and rhythm without murmur gallop or rub normal S1 and S2 Abdomen: Tender to palpation worse in L upper and lower quad, no rebound, BS+, not distended Extremities: No significant edema bilateral lower extremities - both feet warm to touch   CBC: Recent Labs  Lab 07/01/17 0654 07/01/17 1632  07/03/17 1854 07/04/17 0359 07/05/17 0515  WBC 19.4* 22.8*   < > 15.7* 15.1* 13.9*  NEUTROABS 14.7* 19.9*  --  12.5*  --   --   HGB 12.9* 12.4*   < > 10.3* 9.9* 10.5*  HCT 37.7* 36.4*   < > 35.5* 28.8* 31.0*  MCV 74.8* 75.1*   < > 88.5 76.2* 77.1*  PLT 138* 155   < > 204 158 207   < > = values in this interval not displayed.   Basic Metabolic Panel: Recent Labs  Lab 07/01/17 0654  07/02/17 0012 07/03/17 0300  07/04/17 0359 07/04/17 1554 07/05/17 0515  NA 135   < >  --  136   < > 138 138 138  K 3.3*   < >  --  3.4*   < > 3.2* 2.9* 3.5  CL 106   < >  --  100*   < > 102 101 105  CO2 19*   < >  --  28   < > 26 26 25   GLUCOSE 97   < >  --  144*   < > 158* 136* 121*  BUN 20   < >  --  30*   < > 22* 20 17  CREATININE 1.38*   < >  --  1.32*   < > 1.22 1.24 1.21  CALCIUM 8.3*   < >  --  6.8*   < > 7.3* 7.4* 7.5*  MG 2.0  --  1.9 2.2  --   --   --   --   PHOS  --   --  4.1 1.6*  --   --   --   --    < > = values in this interval not displayed.   GFR: Estimated Creatinine Clearance: 54.4 mL/min (by C-G  formula based on SCr of 1.21 mg/dL).  Liver Function Tests: Recent Labs  Lab 07/01/17 1632 07/04/17 0359  AST 40 151*  ALT 27 50  ALKPHOS 81 92  BILITOT  1.2 0.6  PROT 6.4* 5.7*  ALBUMIN 2.4* 2.1*    Coagulation Profile: Recent Labs  Lab 06/29/17 0320 07/01/17 1632  INR 1.23 1.04    Cardiac Enzymes: Recent Labs  Lab 06/28/17 1544 06/28/17 2120 06/29/17 0320 06/29/17 0904  07/04/17 0359 07/04/17 0937 07/04/17 1554 07/04/17 2041 07/05/17 0515  CKTOTAL  --   --   --   --    < > 4,254* 4,056* 3,553* 3,399* 2,734*  TROPONINI 0.34* 0.45* 0.41* 0.34*  --   --   --   --   --   --    < > = values in this interval not displayed.    HbA1C: Hgb A1c MFr Bld  Date/Time Value Ref Range Status  06/29/2017 03:20 AM 5.6 4.8 - 5.6 % Final    Comment:    (NOTE) Pre diabetes:          5.7%-6.4% Diabetes:              >6.4% Glycemic control for   <7.0% adults with diabetes     CBG: Recent Labs  Lab 07/03/17 2333 07/04/17 0543 07/04/17 1207 07/04/17 1845 07/04/17 2314  GLUCAP 161* 167* 158* 100* 132*    Recent Results (from the past 240 hour(s))  Culture, blood (Routine X 2) w Reflex to ID Panel     Status: Abnormal   Collection Time: 06/28/17  9:10 PM  Result Value Ref Range Status   Specimen Description   Final    BLOOD RIGHT ANTECUBITAL Performed at [facility], [place] [address], [place], [place] [address]    Special Requests   Final    BOTTLES DRAWN AEROBIC AND ANAEROBIC Blood Culture adequate volume Performed at [facility] [address], [place], [place] [address]    Culture  Setup Time   Final    GRAM POSITIVE COCCI IN CHAINS IN BOTH AEROBIC AND ANAEROBIC BOTTLES CRITICAL VALUE NOTED.  VALUE IS CONSISTENT WITH PREVIOUSLY REPORTED AND CALLED VALUE.    Culture (A)  Final    STREPTOCOCCUS PNEUMONIAE SUSCEPTIBILITIES PERFORMED ON PREVIOUS CULTURE WITHIN THE LAST 5 DAYS. Performed at [facility], [place]  [address], [place], [place] [address]    Report Status 07/01/2017 FINAL  Final  Culture, blood (Routine X 2) w Reflex to ID Panel     Status: Abnormal   Collection Time: 06/28/17  9:10 PM  Result Value Ref Range Status   Specimen Description   Final    BLOOD LEFT HAND Performed at [facility] [address] [place], [place] [address]    Special Requests   Final    BOTTLES DRAWN AEROBIC AND ANAEROBIC Blood Culture adequate volume Performed at [facility] [address], [place], [place] [address]    Culture  Setup Time   Final    GRAM POSITIVE COCCI IN CHAINS IN BOTH AEROBIC AND ANAEROBIC BOTTLES CRITICAL RESULT CALLED TO, READ BACK BY AND VERIFIED WITH: D. Wofford PharmD 14:45 06/29/17 ([email or web address]) Performed at [facility], [place] [address], [place], [place] [address]    Culture STREPTOCOCCUS PNEUMONIAE (A)  Final   Report Status 07/01/2017 FINAL  Final   Organism ID, Bacteria STREPTOCOCCUS PNEUMONIAE  Final      Susceptibility   Streptococcus pneumoniae - MIC*    ERYTHROMYCIN <=0.12 SENSITIVE Sensitive     LEVOFLOXACIN 0.5 SENSITIVE Sensitive     PENICILLIN (meningitis) 0.25 RESISTANT Resistant     PENICILLIN (non-meningitis)  0.25 SENSITIVE Sensitive     CEFTRIAXONE (non-meningitis) <=0.12 SENSITIVE Sensitive     CEFTRIAXONE (meningitis) <=0.12 SENSITIVE Sensitive     * STREPTOCOCCUS PNEUMONIAE  Blood Culture ID Panel (Reflexed)     Status: Abnormal   Collection Time: 06/28/17  9:10 PM  Result Value Ref Range Status   Enterococcus species NOT DETECTED NOT DETECTED Final   Listeria monocytogenes NOT DETECTED NOT DETECTED Final   Staphylococcus species NOT DETECTED NOT DETECTED Final   Staphylococcus aureus NOT DETECTED NOT DETECTED Final   Streptococcus species DETECTED (A) NOT DETECTED Final    Comment: CRITICAL RESULT CALLED TO, READ BACK BY AND VERIFIED WITH: D. Wofford PharmD 14:45 06/29/17 ([email or web address])    Streptococcus agalactiae NOT  DETECTED NOT DETECTED Final   Streptococcus pneumoniae DETECTED (A) NOT DETECTED Final    Comment: CRITICAL RESULT CALLED TO, READ BACK BY AND VERIFIED WITH: D. Wofford PharmD 14:45 06/29/17 ([email or web address])    Streptococcus pyogenes NOT DETECTED NOT DETECTED Final   Acinetobacter baumannii NOT DETECTED NOT DETECTED Final   Enterobacteriaceae species NOT DETECTED NOT DETECTED Final   Enterobacter cloacae complex NOT DETECTED NOT DETECTED Final   Escherichia coli NOT DETECTED NOT DETECTED Final   Klebsiella oxytoca NOT DETECTED NOT DETECTED Final   Klebsiella pneumoniae NOT DETECTED NOT DETECTED Final   Proteus species NOT DETECTED NOT DETECTED Final   Serratia marcescens NOT DETECTED NOT DETECTED Final   Haemophilus influenzae NOT DETECTED NOT DETECTED Final   Neisseria meningitidis NOT DETECTED NOT DETECTED Final   Pseudomonas aeruginosa NOT DETECTED NOT DETECTED Final   Candida albicans NOT DETECTED NOT DETECTED Final   Candida glabrata NOT DETECTED NOT DETECTED Final   Candida krusei NOT DETECTED NOT DETECTED Final   Candida parapsilosis NOT DETECTED NOT DETECTED Final   Candida tropicalis NOT DETECTED NOT DETECTED Final    Comment: Performed at [facility], [place] [address] [place], [place] [address]  Culture, blood (routine x 2)     Status: None (Preliminary result)   Collection Time: 06/30/17  2:18 PM  Result Value Ref Range Status   Specimen Description BLOOD RIGHT ARM  Final   Special Requests   Final    BOTTLES DRAWN AEROBIC AND ANAEROBIC Blood Culture adequate volume   Culture   Final    NO GROWTH 4 DAYS Performed at [facility], [place] [address] [place], [place] [address]    Report Status PENDING  Incomplete  Culture, blood (routine x 2)     Status: None (Preliminary result)   Collection Time: 06/30/17  2:23 PM  Result Value Ref Range Status   Specimen Description BLOOD RIGHT FOREARM  Final   Special Requests   Final    BOTTLES DRAWN AEROBIC AND ANAEROBIC Blood  Culture adequate volume   Culture   Final    NO GROWTH 4 DAYS Performed at [facility], [place] [address] [place], [place] [address]    Report Status PENDING  Incomplete  MRSA PCR Screening     Status: None   Collection Time: 07/01/17 11:42 PM  Result Value Ref Range Status   MRSA by PCR NEGATIVE NEGATIVE Final    Comment:        The GeneXpert MRSA Assay (FDA approved for NASAL specimens only), is one component of a comprehensive MRSA colonization surveillance program. It is not intended to diagnose MRSA infection nor to guide or monitor treatment for MRSA infections. Performed at [facility], [place]  [address], [place], [place] [address]   Culture, respiratory (NON-Expectorated)     Status: None   Collection Time: 07/02/17  4:45 AM  Result Value Ref Range Status   Specimen Description TRACHEAL ASPIRATE  Final   Special Requests NONE  Final   Gram Stain   Final    FEW WBC PRESENT, PREDOMINANTLY PMN FEW GRAM NEGATIVE RODS FEW GRAM POSITIVE COCCI IN PAIRS RARE GRAM NEGATIVE COCCOBACILLI    Culture   Final    RARE Consistent with normal respiratory flora. Performed at [facility], [place] [address], [place], [place] [address]    Report Status 07/04/2017 FINAL  Final     Scheduled Meds: . atorvastatin  40 mg Oral q1800  . Chlorhexidine Gluconate Cloth  6 each Topical Daily  . fentaNYL (SUBLIMAZE) injection  50 mcg Intravenous Once  . hydrocortisone sod succinate (SOLU-CORTEF) inj  50 mg Intravenous Q12H  . insulin aspart  1-3 Units Subcutaneous Q6H  . pantoprazole (PROTONIX) IV  40 mg Intravenous Daily  . senna-docusate  2 tablet Oral QHS     LOS: 7 days   Cherene Altes, MD [facility] Office  [phone] Pager - Text Page per Amion as per below:  On-Call/Text Page:      [name]      password [email or web address]  If 7PM-7AM, please contact night-coverage [email or web address] Password [email or web address] 07/05/2017, 8:58 AM

## 2017-07-06 LAB — CBC
HCT: 31.8 % — ABNORMAL LOW (ref 39.0–52.0)
Hemoglobin: 10.7 g/dL — ABNORMAL LOW (ref 13.0–17.0)
MCH: 26 pg (ref 26.0–34.0)
MCHC: 33.6 g/dL (ref 30.0–36.0)
MCV: 77.4 fL — AB (ref 78.0–100.0)
Platelets: 253 10*3/uL (ref 150–400)
RBC: 4.11 MIL/uL — ABNORMAL LOW (ref 4.22–5.81)
RDW: 14.8 % (ref 11.5–15.5)
WBC: 16.2 10*3/uL — ABNORMAL HIGH (ref 4.0–10.5)

## 2017-07-06 LAB — COMPREHENSIVE METABOLIC PANEL WITH GFR
ALT: 60 U/L (ref 17–63)
AST: 81 U/L — ABNORMAL HIGH (ref 15–41)
Albumin: 2.1 g/dL — ABNORMAL LOW (ref 3.5–5.0)
Alkaline Phosphatase: 102 U/L (ref 38–126)
Anion gap: 8 (ref 5–15)
BUN: 17 mg/dL (ref 6–20)
CO2: 24 mmol/L (ref 22–32)
Calcium: 7.7 mg/dL — ABNORMAL LOW (ref 8.9–10.3)
Chloride: 105 mmol/L (ref 101–111)
Creatinine, Ser: 1.15 mg/dL (ref 0.61–1.24)
GFR calc Af Amer: >60 mL/min
GFR calc non Af Amer: >60 mL/min
Glucose, Bld: 108 mg/dL — ABNORMAL HIGH (ref 65–99)
Potassium: 3.2 mmol/L — ABNORMAL LOW (ref 3.5–5.1)
Sodium: 137 mmol/L (ref 135–145)
Total Bilirubin: 1 mg/dL (ref 0.3–1.2)
Total Protein: 6 g/dL — ABNORMAL LOW (ref 6.5–8.1)

## 2017-07-06 LAB — MAGNESIUM: Magnesium: 2.2 mg/dL (ref 1.7–2.4)

## 2017-07-06 LAB — PHOSPHORUS: Phosphorus: 2.9 mg/dL (ref 2.5–4.6)

## 2017-07-06 LAB — CK: Total CK: 1207 U/L — ABNORMAL HIGH (ref 49–397)

## 2017-07-06 MED ORDER — POTASSIUM CHLORIDE CRYS ER 20 MEQ PO TBCR
40.0000 meq | EXTENDED_RELEASE_TABLET | Freq: Two times a day (BID) | ORAL | Status: AC
  Administered 2017-07-06 – 2017-07-07 (×3): 40 meq via ORAL
  Filled 2017-07-06 (×3): qty 2

## 2017-07-06 MED ORDER — ACETAMINOPHEN 160 MG/5ML PO SOLN
650.0000 mg | Freq: Four times a day (QID) | ORAL | Status: DC | PRN
  Administered 2017-07-07 – 2017-07-08 (×3): 650 mg via ORAL
  Filled 2017-07-06 (×3): qty 20.3

## 2017-07-06 MED ORDER — ACETAMINOPHEN 650 MG RE SUPP: 650.0000 mg | Freq: Four times a day (QID) | RECTAL | Status: DC | PRN

## 2017-07-06 MED ORDER — ENSURE ENLIVE PO LIQD
237.0000 mL | Freq: Three times a day (TID) | ORAL | Status: DC
  Administered 2017-07-06 – 2017-07-09 (×2): 237 mL via ORAL

## 2017-07-06 MED ORDER — ADULT MULTIVITAMIN W/MINERALS CH
1.0000 | ORAL_TABLET | Freq: Every day | ORAL | Status: DC
  Administered 2017-07-06 – 2017-07-14 (×9): 1 via ORAL
  Filled 2017-07-06 (×9): qty 1

## 2017-07-06 NOTE — Care Management Note (Addendum)
Case Management  Original Note Created by  Marvetta Gibbons RN, BSN Unit 4E-Case Manager- [place] coverage [phone]  Patient Details  Name: Thomas [name] MRN: [id number] Date of Birth: [birth year]  Subjective/Objective:  Pt admitted with weakness, chills, dizziness, cough, left-sided chest pain, abdominal pain, nausea and vomiting to [facility]- tx to [facility] for acute stroke- MRI positive for acute left cerebellar infarcts possibly septic emboli vs. hypotension in the setting of vascular stenosis.  S/p emergent endovascular repair of ruptured AAA, bilateral iliofemoral endarterectomy, BLE thromboembolectomies on 07/01/17- remains on vent post op with plan to extubate 07/02/17                Action/Plan: PTA  Pt lived at home was independent with ADLs and worked as a Presenter, broadcasting. Pt will have family/friends to assist when ready to transition home.  Per MD note most likely will need extended coarse of abx ?iv for discharge- CM to follow for transition of care needs.   Expected Discharge Date:                 Expected Discharge Plan:  OP Rehab  In-House Referral:     Discharge planning Services  CM Consult  Post Acute Care Choice:    Choice offered to:     DME Arranged:    DME Agency:     HH Arranged:  RN, PT [place] Agency:  [facility]  Status of Service:  In process, will continue to follow  If discussed at Long Length of Stay Meetings, dates discussed:    Discharge Disposition:   Additional Comments: 07/06/2017  Update 1640:  CM informed that by [facility] that pt actually does not have IV infusion nor HH benefits with current insurance.  [facility] working with pt/family to set pt up on payment plan for infusion, train family on IV antibiotic administration and arrange Short Stay to see pt once a week for labs and dressing changes (per [facility] pt will not require HHRN for infusion with these arrangements).  Pt will need to go to outpt PT for therapy as HH is not covered.  CM texted paged  attending  Update: CM met with pt and daughter at bedside - pt is independent from home alone.  Daughter has agreed to come to pts house daily and help with IV antibiotics and dressing changes.  [facility] to meet with pt and daughter today at 3pm.  Based on the pt needs for [facility] he will need to have HHPT instead of outpt PT - Attending informed and orders requested.  [facility] aware of HHRN and PT need - referral accepted  Pt now on 2c.  Per I&D; pt will need 6 weeks IV antibiotic Rocephin - per [facility] note they are already following .  Maryclare Labrador, RN 07/06/2017, 3:11 PM

## 2017-07-06 NOTE — Progress Notes (Signed)
[place] TEAM 1 - Stepdown/ICU TEAM  Thomas [name]  EXB:[id number] DOB: September 21, 1955 DOA: 06/28/2017 PCP: Patient, No Pcp Per    Brief Narrative:  62yo M w/ a hx of Tobacco Abuse, HTN, and CKD Stage 3 who presented to the ED on 4/4 with fever, chills, nonproductive cough, and chest pain. WBC 24.1. CXR with interstitial lung disease with pleural scarring. EKG with ST elevation in the anterior leads. Cardiology was consulted and felt this was due to acute pericarditis. Started on ASA. Placed on azithromycin and rocephin for probable clinical pneumonia. Admitted to [facility].  During this admission an MRI Brain obtained d/u to c/o dizziness noted 3 acute areas of CVA in the left cerebellum as well as evidence of old small bilateral cerebellar infarcts and old basal ganglia/thalamic infarcts.  Blood cultures returned positive for Pnemococcal bacteremia, raising a concern for bacterial endocarditis.   On 4/7 patient the pt developed diffuse abdominal pain with hypotension in the 70s. CT A/P noted a 6.6 cm diameter aortic aneurysm with findings concerning for rupture. He was taken to OR for endovascular repair of ruptured AAA.  In the PACU he was noted to have absent fem pulses and was taken back to OR for bilateral iliofemoral endarterectomy, BLE thromboembolectomies  Significant Events: 4/4 admit   4/6 MRI noted 3 small acute CVAs 4/7 acute AAA rupture - emergent repair in OR - return to OR w/ B LE thrombosis  4/11 TRH assumed care 4/11 PICC placed   Subjective: Resting comfortably in bed.  Continues to report some L flank pain.  Denies cp, sob, n/v, or abdom pain.  Reports poor appetite w/ limited intake.  States he has regained feeling in both his feet, and his R foot/leg is no longer numb.    Assessment & Plan:  Ruptured Aortic Aneurysm s/p stent placement - large RPH  S/p emergent endovascular repair 4/7 w/ B iliofemoral endarterectomy and subsequent B LE thromboembolectomies - care per Vascular  Surgery   S/P Bilateral LE Thrombectomies   CTA 4/9 shows thrombus in the right limb of the graft - Vascular directing care of this issue    Aneurysm of the ascending thoracic aorta measuring at least 4.4cm Noted on CT chest - will need serial f/u   Strep pneumo bacteremia - concern for Bacterial Endocarditis - Pericarditis  No vegetation on TTE - abx per ID - decision has been made to forgo TEE - f/u blood cx on 06/30/17 while on abx negative x5 days   LLL PNA - presumed Strep pneumo On abx coverage - f/u CXR suggests improvement - no persisting clinical sx   3 acute LEFT cerebellar infarcts  Pt reported recent dizziness at admit - septic emboli v/s hypotension - Neurology following - MRI also noted old small bilateral cerebellar infarcts + old basal ganglia and thalamus infarcts  RLE weakness and numbness - PVD more consistent with PVD per Neurology   Acute hypoxic respiratory failure  in setting hemorrhagic shock r/t ruptured AAA  extubated 4/8 - has now been weaned to RA   ILD per CT will need outpatient follow up for ILD  Hemorrhagic Shock / Acute blood loss anemia in setting of Ruptured AAA Hgb cont to improve at this time   Recent Labs  Lab 07/03/17 1854 07/04/17 0359 07/05/17 0515 07/06/17 0330  HGB 10.3* 9.9* 10.5* 10.7*    Intracranial vascular occlusion / stenosis MRA noted right M1 occlusion, likely chronic, bilateral PCA stenosis, and terminal right vertebral artery stenosis -  avoid hypotension   HTN Goal SBP 110-140 per Neuro recs - BP elevated today - adjust tx and follow - wish to avoid extremes of hypotension or hypertension   Indeterminate L adrenal nodule  Noted on CTa chest/abd/pelvis 4/9 - will need f/u imaging   HLD  On Lipitor     Chronic Kidney Disease Stage 3  Recent Labs  Lab 07/03/17 1854 07/04/17 0359 07/04/17 1554 07/05/17 0515 07/06/17 0330  CREATININE 1.23 1.22 1.24 1.21 1.15   Mild Hypokalemia  Replace and follow - Mg is  normal   DVT prophylaxis: SCDs + lovenox  Code Status: FULL CODE Family Communication: no family present at time of exam  Disposition Plan: SDU - begin PT/OT mobilization   Consultants:  Vasc Surgery  PCCM ID  Neurology   Antimicrobials:  Azithromycin 4/4  Rocephin 4/4 >  Ancef 4/7   Objective: Blood pressure 139/73, pulse 63, temperature 99.3 F (37.4 C), temperature source Oral, resp. rate 16, height 5\' 5"  (1.651 m), weight 64.7 kg (142 lb 10.2 oz), SpO2 97 %.  Intake/Output Summary (Last 24 hours) at 07/06/2017 0957 Last data filed at 07/06/2017 0905 Gross per 24 hour  Intake 700 ml  Output 3375 ml  Net -2675 ml   Filed Weights   07/04/17 0500 07/05/17 0500 07/06/17 0518  Weight: 64.6 kg (142 lb 6.7 oz) 64.8 kg (142 lb 12.8 oz) 64.7 kg (142 lb 10.2 oz)    Examination: General: No acute respiratory distress - alert and conversant  Lungs: CTA th/o - no whezing  Cardiovascular: RRR - no M  Abdomen: Tender to palpation worse in L upper and lower quad/flank, no rebound, BS+, not distended Extremities: No significant edema B LE - both feet warm to touch   CBC: Recent Labs  Lab 07/01/17 0654 07/01/17 1632  07/03/17 1854 07/04/17 0359 07/05/17 0515 07/06/17 0330  WBC 19.4* 22.8*   < > 15.7* 15.1* 13.9* 16.2*  NEUTROABS 14.7* 19.9*  --  12.5*  --   --   --   HGB 12.9* 12.4*   < > 10.3* 9.9* 10.5* 10.7*  HCT 37.7* 36.4*   < > 35.5* 28.8* 31.0* 31.8*  MCV 74.8* 75.1*   < > 88.5 76.2* 77.1* 77.4*  PLT 138* 155   < > 204 158 207 253   < > = values in this interval not displayed.   Basic Metabolic Panel: Recent Labs  Lab 07/02/17 0012 07/03/17 0300  07/04/17 1554 07/05/17 0515 07/06/17 0330  NA  --  136   < > 138 138 137  K  --  3.4*   < > 2.9* 3.5 3.2*  CL  --  100*   < > 101 105 105  CO2  --  28   < > 26 25 24   GLUCOSE  --  144*   < > 136* 121* 108*  BUN  --  30*   < > 20 17 17   CREATININE  --  1.32*   < > 1.24 1.21 1.15  CALCIUM  --  6.8*   < > 7.4* 7.5*  7.7*  MG 1.9 2.2  --   --   --  2.2  PHOS 4.1 1.6*  --   --   --  2.9   < > = values in this interval not displayed.   GFR: Estimated Creatinine Clearance: 57.2 mL/min (by C-G formula based on SCr of 1.15 mg/dL).  Liver Function Tests: Recent Labs  Lab 07/01/17 [phone]  07/04/17 0359 07/06/17 0330  AST 40 151* 81*  ALT 27 50 60  ALKPHOS 81 92 102  BILITOT 1.2 0.6 1.0  PROT 6.4* 5.7* 6.0*  ALBUMIN 2.4* 2.1* 2.1*    Coagulation Profile: Recent Labs  Lab 07/01/17 1632  INR 1.04    Cardiac Enzymes: Recent Labs  Lab 07/04/17 1554 07/04/17 2041 07/05/17 0515 07/05/17 1418 07/06/17 0330  CKTOTAL 3,553* 3,399* 2,734* 2,091* 1,207*    HbA1C: Hgb A1c MFr Bld  Date/Time Value Ref Range Status  06/29/2017 03:20 AM 5.6 4.8 - 5.6 % Final    Comment:    (NOTE) Pre diabetes:          5.7%-6.4% Diabetes:              >6.4% Glycemic control for   <7.0% adults with diabetes     CBG: Recent Labs  Lab 07/03/17 2333 07/04/17 0543 07/04/17 1207 07/04/17 1845 07/04/17 2314  GLUCAP 161* 167* 158* 100* 132*    Recent Results (from the past 240 hour(s))  Culture, blood (Routine X 2) w Reflex to ID Panel     Status: Abnormal   Collection Time: 06/28/17  9:10 PM  Result Value Ref Range Status   Specimen Description   Final    BLOOD RIGHT ANTECUBITAL Performed at [facility], [place] [address], [place], [place] [address]    Special Requests   Final    BOTTLES DRAWN AEROBIC AND ANAEROBIC Blood Culture adequate volume Performed at [facility] [address], [place], [place] [address]    Culture  Setup Time   Final    GRAM POSITIVE COCCI IN CHAINS IN BOTH AEROBIC AND ANAEROBIC BOTTLES CRITICAL VALUE NOTED.  VALUE IS CONSISTENT WITH PREVIOUSLY REPORTED AND CALLED VALUE.    Culture (A)  Final    STREPTOCOCCUS PNEUMONIAE SUSCEPTIBILITIES PERFORMED ON PREVIOUS CULTURE WITHIN THE LAST 5 DAYS. Performed at [facility], [place] [address], [place], [place] [address]    Report Status 07/01/2017 FINAL  Final  Culture, blood (Routine X 2) w Reflex to ID Panel     Status: Abnormal   Collection Time: 06/28/17  9:10 PM  Result Value Ref Range Status   Specimen Description   Final    BLOOD LEFT HAND Performed at [facility] [address] [place], [place] [address]    Special Requests   Final    BOTTLES DRAWN AEROBIC AND ANAEROBIC Blood Culture adequate volume Performed at [facility] [address] [place], [place] [address]    Culture  Setup Time   Final    GRAM POSITIVE COCCI IN CHAINS IN BOTH AEROBIC AND ANAEROBIC BOTTLES CRITICAL RESULT CALLED TO, READ BACK BY AND VERIFIED WITH: D. Wofford PharmD 14:45 06/29/17 ([email or web address]) Performed at [facility], [place] [address], [place], [place] [address]    Culture STREPTOCOCCUS PNEUMONIAE (A)  Final   Report Status 07/01/2017 FINAL  Final   Organism ID, Bacteria STREPTOCOCCUS PNEUMONIAE  Final      Susceptibility   Streptococcus pneumoniae - MIC*    ERYTHROMYCIN <=0.12 SENSITIVE Sensitive     LEVOFLOXACIN 0.5 SENSITIVE Sensitive     PENICILLIN (meningitis) 0.25 RESISTANT Resistant     PENICILLIN (non-meningitis) 0.25 SENSITIVE Sensitive     CEFTRIAXONE (non-meningitis) <=0.12 SENSITIVE Sensitive     CEFTRIAXONE (meningitis) <=0.12 SENSITIVE Sensitive     * STREPTOCOCCUS PNEUMONIAE  Blood Culture ID Panel (Reflexed)     Status:  Abnormal   Collection Time: 06/28/17  9:10 PM  Result Value Ref Range Status   Enterococcus species NOT DETECTED NOT DETECTED Final   Listeria monocytogenes NOT DETECTED NOT DETECTED Final   Staphylococcus species NOT DETECTED NOT DETECTED Final   Staphylococcus aureus NOT DETECTED NOT DETECTED Final   Streptococcus species DETECTED (A) NOT DETECTED Final    Comment: CRITICAL RESULT CALLED TO, READ BACK BY AND VERIFIED WITH: D. Wofford PharmD 14:45 06/29/17 ([email or web address])    Streptococcus agalactiae NOT  DETECTED NOT DETECTED Final   Streptococcus pneumoniae DETECTED (A) NOT DETECTED Final    Comment: CRITICAL RESULT CALLED TO, READ BACK BY AND VERIFIED WITH: D. Wofford PharmD 14:45 06/29/17 ([email or web address])    Streptococcus pyogenes NOT DETECTED NOT DETECTED Final   Acinetobacter baumannii NOT DETECTED NOT DETECTED Final   Enterobacteriaceae species NOT DETECTED NOT DETECTED Final   Enterobacter cloacae complex NOT DETECTED NOT DETECTED Final   Escherichia coli NOT DETECTED NOT DETECTED Final   Klebsiella oxytoca NOT DETECTED NOT DETECTED Final   Klebsiella pneumoniae NOT DETECTED NOT DETECTED Final   Proteus species NOT DETECTED NOT DETECTED Final   Serratia marcescens NOT DETECTED NOT DETECTED Final   Haemophilus influenzae NOT DETECTED NOT DETECTED Final   Neisseria meningitidis NOT DETECTED NOT DETECTED Final   Pseudomonas aeruginosa NOT DETECTED NOT DETECTED Final   Candida albicans NOT DETECTED NOT DETECTED Final   Candida glabrata NOT DETECTED NOT DETECTED Final   Candida krusei NOT DETECTED NOT DETECTED Final   Candida parapsilosis NOT DETECTED NOT DETECTED Final   Candida tropicalis NOT DETECTED NOT DETECTED Final    Comment: Performed at [facility], [place] [address], [place], [place] [address]  Culture, blood (routine x 2)     Status: None   Collection Time: 06/30/17  2:18 PM  Result Value Ref Range Status   Specimen Description BLOOD RIGHT ARM  Final   Special Requests   Final    BOTTLES DRAWN AEROBIC AND ANAEROBIC Blood Culture adequate volume   Culture   Final    NO GROWTH 5 DAYS Performed at [facility], [address] [place], [place] [address]    Report Status 07/05/2017 FINAL  Final  Culture, blood (routine x 2)     Status: None   Collection Time: 06/30/17  2:23 PM  Result Value Ref Range Status   Specimen Description BLOOD RIGHT FOREARM  Final   Special Requests   Final    BOTTLES DRAWN AEROBIC AND ANAEROBIC Blood Culture adequate volume   Culture    Final    NO GROWTH 5 DAYS Performed at [facility], [place] [address], [place], [place] [address]    Report Status 07/05/2017 FINAL  Final  MRSA PCR Screening     Status: None   Collection Time: 07/01/17 11:42 PM  Result Value Ref Range Status   MRSA by PCR NEGATIVE NEGATIVE Final    Comment:        The GeneXpert MRSA Assay (FDA approved for NASAL specimens only), is one component of a comprehensive MRSA colonization surveillance program. It is not intended to diagnose MRSA infection nor to guide or monitor treatment for MRSA infections. Performed at [facility], [place] [address], [place], [place] [address]   Culture, respiratory (NON-Expectorated)     Status: None   Collection Time: 07/02/17  4:45 AM  Result Value Ref Range Status   Specimen Description TRACHEAL ASPIRATE  Final   Special Requests  NONE  Final   Gram Stain   Final    FEW WBC PRESENT, PREDOMINANTLY PMN FEW GRAM NEGATIVE RODS FEW GRAM POSITIVE COCCI IN PAIRS RARE GRAM NEGATIVE COCCOBACILLI    Culture   Final    RARE Consistent with normal respiratory flora. Performed at [facility], [place] [address], [place], [place] [address]    Report Status 07/04/2017 FINAL  Final     Scheduled Meds: . aspirin EC  81 mg Oral Daily  . atorvastatin  40 mg Oral q1800  . Chlorhexidine Gluconate Cloth  6 each Topical Daily  . enoxaparin (LOVENOX) injection  30 mg Subcutaneous Q12H  . pantoprazole  40 mg Oral Q1200  . senna-docusate  2 tablet Oral QHS     LOS: 8 days   Cherene Altes, MD [facility] Office  [phone] Pager - Text Page per Thomas [name] as per below:  On-Call/Text Page:      [name]      password [email or web address]  If 7PM-7AM, please contact night-coverage [email or web address] Password [facility] 07/06/2017, 9:57 AM

## 2017-07-06 NOTE — Progress Notes (Signed)
    Subjective  - POD #5  Says right leg feels better today and numbness almost gone Has pain in left flank   Physical Exam:  Palpable pedal pulses Groin incisiopns clean       Assessment/Plan:  POD #5  Stable from my perspective Continue IV abx   [place] Thomas [name] 07/06/2017 12:54 PM --  Vitals:   07/06/17 0700 07/06/17 1100  BP: 139/73 (!) 146/74  Pulse: 63 73  Resp: 16 16  Temp: 99.3 F (37.4 C) 99.1 F (37.3 C)  SpO2: 97% 96%    Intake/Output Summary (Last 24 hours) at 07/06/2017 1254 Last data filed at 07/06/2017 1118 Gross per 24 hour  Intake 700 ml  Output 2175 ml  Net -1475 ml     Laboratory CBC    Component Value Date/Time   WBC 16.2 (H) 07/06/2017 0330   HGB 10.7 (L) 07/06/2017 0330   HCT 31.8 (L) 07/06/2017 0330   PLT 253 07/06/2017 0330    BMET    Component Value Date/Time   NA 137 07/06/2017 0330   K 3.2 (L) 07/06/2017 0330   CL 105 07/06/2017 0330   CO2 24 07/06/2017 0330   GLUCOSE 108 (H) 07/06/2017 0330   BUN 17 07/06/2017 0330   CREATININE 1.15 07/06/2017 0330   CALCIUM 7.7 (L) 07/06/2017 0330   GFRNONAA >60 07/06/2017 0330   GFRAA >60 07/06/2017 0330    COAG Lab Results  Component Value Date   INR 1.04 07/01/2017   INR 1.23 06/29/2017   No results found for: PTT  Antibiotics Anti-infectives (From admission, onward)   Start     Dose/Rate Route Frequency Ordered Stop   06/30/17 1600  cefTRIAXone (ROCEPHIN) 2 g in sodium chloride 0.9 % 100 mL IVPB     2 g 200 mL/hr over 30 Minutes Intravenous Every 12 hours 06/30/17 1354     06/29/17 2200  azithromycin (ZITHROMAX) tablet 500 mg  Status:  Discontinued     500 mg Oral Daily at bedtime 06/29/17 1159 06/29/17 1454   06/29/17 1500  cefTRIAXone (ROCEPHIN) 2 g in sodium chloride 0.9 % 100 mL IVPB  Status:  Discontinued     2 g 200 mL/hr over 30 Minutes Intravenous Every 24 hours 06/29/17 1454 06/30/17 1354   06/28/17 2000  cefTRIAXone (ROCEPHIN) 1 g in sodium chloride 0.9 %  100 mL IVPB  Status:  Discontinued     1 g 200 mL/hr over 30 Minutes Intravenous Every 24 hours 06/28/17 1839 06/29/17 1454   06/28/17 2000  azithromycin (ZITHROMAX) 500 mg in sodium chloride 0.9 % 250 mL IVPB  Status:  Discontinued     500 mg 250 mL/hr over 60 Minutes Intravenous Every 24 hours 06/28/17 1839 06/29/17 1159       V. Leia Alf, M.D. [facility] Office: [phone] Pager:  [phone]

## 2017-07-06 NOTE — Evaluation (Addendum)
Physical Therapy Evaluation Patient Details Name: Keylin Podolsky MRN: [id number] DOB: 1955/11/11 Today's Date: 07/06/2017   History of Present Illness  Pt is a 62 y.o. male with medical history significant of ongoing tobacco abuse otherwise unremarkable admitted 06/28/17 with sudden onset of subjective fevers, chills, diaphoresis, nonproductive cough, N&V and some EKG changes. R/o MI, but possible acute pericarditis. Pt with persistent dizziness; MRI 4/4 showed three acute nonhemorrhagic small L cerebellar infarcts. 4/7 acute AAA rupture requiring emergent repair; return to OR for BLE thrombosis with bilat iliofemoral endarterectomy.    Clinical Impression  Pt seen for re-evaluation s/p above. Limited by c/o significant pain and headache this session; only willing to ambulate from bed<>bathroom. Able to do so with intermittent UE support and min guard for balance; voided at toilet with UE support on rail and supervision. Recommend initial use of RW for added stability at home; pt in agreement with this. Pt declining targeted muscle, coordination, and vestibular testing secondary to pain and fatigue. Pt lives alone, but will have initial support from family. Would benefit from an additional acute PT visit to maximize functional mobility and independence prior to return home with HHPT services.    Follow Up Recommendations Home health PT;Supervision for mobility/OOB    Equipment Recommendations  Rolling walker with 5" wheels    Recommendations for Other Services OT consult     Precautions / Restrictions Precautions Precautions: Fall Restrictions Weight Bearing Restrictions: No      Mobility  Bed Mobility Overal bed mobility: Modified Independent             General bed mobility comments: Increased time and effort secondary to c/o pain  Transfers Overall transfer level: Needs assistance Equipment used: None Transfers: Sit to/from Stand Sit to Stand: Min guard         General  transfer comment: Slight unsteadiness standing from bed with min guard for balance and 1x HHA assist  Ambulation/Gait Ambulation/Gait assistance: Min guard Ambulation Distance (Feet): 10 Feet Assistive device: None Gait Pattern/deviations: Step-through pattern;Decreased stride length;Antalgic Gait velocity: Decreased   General Gait Details: Slow, unsteady amb from bed<>bathroom with no DME, pt reaching to wall/furniture fore UE support. Recommend use of RW for further amb; pt agreed, but declining further amb training due to pain  Stairs            Wheelchair Mobility    Modified Rankin (Stroke Patients Only)       Balance Overall balance assessment: Needs assistance Sitting-balance support: No upper extremity supported;Feet supported Sitting balance-Leahy Scale: Good     Standing balance support: No upper extremity supported Standing balance-Leahy Scale: Fair                               Pertinent Vitals/Pain Pain Assessment: Faces Faces Pain Scale: Hurts even more Pain Location: Abdominal incisions, head Pain Descriptors / Indicators: Discomfort;Headache;Sore Pain Intervention(s): Monitored during session;Limited activity within patient's tolerance    Home Living Family/patient expects to be discharged to:: Private residence Living Arrangements: Other relatives Available Help at Discharge: Family;Friend(s);Available PRN/intermittently Type of Home: House Home Access: Stairs to enter Entrance Stairs-Rails: None Entrance Stairs-Number of Steps: 2 Home Layout: One level Home Equipment: Cane - single point      Prior Function Level of Independence: Independent         Comments: works in [employer]: Right  Extremity/Trunk Assessment   Upper Extremity Assessment Upper Extremity Assessment: Overall WFL for tasks assessed(Functionally WFL; pt declined further testing)    Lower Extremity  Assessment Lower Extremity Assessment: Generalized weakness(Functionally at least 3/5; pt declined further testing)       Communication   Communication: No difficulties  Cognition Arousal/Alertness: Awake/alert Behavior During Therapy: Flat affect Overall Cognitive Status: Within Functional Limits for tasks assessed                                 General Comments: Limited by c/o headache, keeping eyes closed majority of time. C/o dizziness with mobility, but not willing to practice any stabilization techniques today      General Comments General comments (skin integrity, edema, etc.): Many family members present during session. [place] currently being trained by pharmacy on administration of IV antibiotics for home    Exercises     Assessment/Plan    PT Assessment Patient needs continued PT services  PT Problem List Decreased strength;Decreased activity tolerance;Decreased balance;Decreased mobility;Decreased knowledge of use of DME       PT Treatment Interventions DME instruction;Gait training;Stair training;Functional mobility training;Therapeutic activities;Therapeutic exercise;Balance training;Patient/family education;Neuromuscular re-education    PT Goals (Current goals can be found in the Care Plan section)  Acute Rehab PT Goals Patient Stated Goal: Decreased pain PT Goal Formulation: With patient Time For Goal Achievement: 07/20/17 Potential to Achieve Goals: Good    Frequency Min 3X/week   Barriers to discharge        Co-evaluation               AM-PAC PT "6 Clicks" Daily Activity  Outcome Measure Difficulty turning over in bed (including adjusting bedclothes, sheets and blankets)?: A Little Difficulty moving from lying on back to sitting on the side of the bed? : A Little Difficulty sitting down on and standing up from a chair with arms (e.g., wheelchair, bedside commode, etc,.)?: A Little Help needed moving to and from a bed to chair  (including a wheelchair)?: A Little Help needed walking in hospital room?: A Little Help needed climbing 3-5 steps with a railing? : A Little 6 Click Score: 18    End of Session   Activity Tolerance: Patient limited by fatigue;Patient limited by pain Patient left: in bed;with call bell/phone within reach;with family/visitor present Nurse Communication: Mobility status PT Visit Diagnosis: Other abnormalities of gait and mobility (R26.89)    Time: 7654-6503 PT Time Calculation (min) (ACUTE ONLY): 15 min   Charges:   PT Evaluation $PT Re-evaluation: 1 Re-eval     PT G Codes:       Mabeline Caras, PT, DPT Acute Rehab Services  Pager: [place] 07/06/2017, 4:29 PM

## 2017-07-06 NOTE — Progress Notes (Signed)
[facility] will be providing Home Infusion Pharmacy services for Mr. Letizia upon DC to home.  [facility] is prepared for weekend DC. Pt has [employer] with does NOT have Home Infusion Benefits or Home Health Benefits. Discussed with pt and his daughter Madlyn Frankel who is at the bedside.  Pt has agreed to private pay for the IV Rocephin and IV supplies at home but I will work to make arrangements on Monday for pt to go to [facility] for weekly labs and PICC care. Daughter Madlyn Frankel and niece Neta Mends both trained on IV Rocephin administration as they will support giving doses at home. They area both CNAs and did very well with mock set up/administration in hospital today.  Discussed with [name], RN, Case Mgr above situation with lack of insurance coverage for home and the plan going forward.  She will discuss with Dr. Thereasa Solo. I also sent Amion message to Dr. Thereasa Solo but have not yet heard back.  [facility] weekend team will follow and support DC home as ordered.  If patient discharges after hours, please call [phone].   Larry Sierras 07/06/2017, 6:00 PM

## 2017-07-06 NOTE — Clinical Social Work Note (Signed)
CSW acknowledges consult for SNF for IV antibiotics. Patients typically only go to SNF for IV antibiotics if they have an IVDU history. Patient does not appear to have this issue and PT recommended outpatient PT. Discussed with Surveyor, quantity of social work. He said he is not aware of any facilities that will take an LOG for [employer]. If home is an option, continue to pursue, if not will seek SNF placement but cannot guarantee insurance would approve.  Dayton Scrape, [place]

## 2017-07-06 NOTE — Progress Notes (Signed)
Initial Nutrition Assessment  DOCUMENTATION CODES:   Not applicable  INTERVENTION:  Ensure Enlive po TID, each supplement provides 350 kcal and 20 grams of protein MVI with minerals -daily  NUTRITION DIAGNOSIS:   Inadequate oral intake related to decreased appetite as evidenced by per patient/family report, meal completion < 25%.  GOAL:   Patient will meet greater than or equal to 90% of their needs  MONITOR:   PO intake, Labs, Weight trends, I & O's, Supplement acceptance  REASON FOR ASSESSMENT:   Consult Assessment of nutrition requirement/status, Poor PO, Diet education  ASSESSMENT:   Thomas [name] is a 62 y.o. male with medical history significant of ongoing tobacco abuse otherwise unremarkable presented to the ED with sudden onset of subjective fevers, chills, diaphoresis, nonproductive cough that started at 4 AM on the day of admission with 3 episodes of emesis and some nausea.  Pt on and off NPO during admission. PO intake is minimal; 100% of one meal 4/5, 50% of one meal 07/03/17, 10% of breakfast 07/06/17 since admit. Pt reports not having an appetite while here and PTA for approximately a week due to N/V.   Prior to his poor appetite he would eat 1-2 meals/day consisting of fish or meat, starch, and a vegetable; his favorite meal is braised chicken, green beans, and coleslaw. When asked how much food he would typically eats, pt used hand gesture (opened hand) to show how much he eats.   Pt states that his weight and appetite has remained unchanged for years.   Pt is open to trying a supplement, but says he will drink a small amount at first as his stomach is not used to drinking that much at one time due to poor PO intake. Pt reports he is lactose intolerant.    Suspected malnutrition due to moderate muscle/fat wasting and poor PO intake during admission; unable to identify at this time.   Medications reviewed: protonix, senokot, rocephin.   Labs reviewed: K+ 3.2 (L), BG  108 (H), albumin 2.1 (L), AST 81 (H), WBC 16.2 (H), RBC 4.11 (L), hemoglobin 10.7 (L).    Intake/Output Summary (Last 24 hours) at 07/06/2017 1318 Last data filed at 07/06/2017 1118 Gross per 24 hour  Intake 700 ml  Output 2175 ml  Net -1475 ml   Lab Results  Component Value Date   HGBA1C 5.6 06/29/2017   NUTRITION - FOCUSED PHYSICAL EXAM:    Most Recent Value  Orbital Region  Mild depletion  Upper Arm Region  Moderate depletion  Thoracic and Lumbar Region  Mild depletion  Buccal Region  Mild depletion  Temple Region  Moderate depletion  Clavicle Bone Region  Moderate depletion  Clavicle and Acromion Bone Region  Moderate depletion  Scapular Bone Region  Unable to assess  Dorsal Hand  Moderate depletion  Patellar Region  Moderate depletion  Anterior Thigh Region  Moderate depletion  Posterior Calf Region  Mild depletion  Edema (RD Assessment)  None  Hair  Reviewed  Eyes  Reviewed  Mouth  Reviewed  Skin  Reviewed  Nails  Reviewed     Diet Order:  Diet Heart Room service appropriate? Yes; Fluid consistency: Thin  EDUCATION NEEDS:   Education needs have been addressed  Skin:  Skin Assessment: Reviewed RN Assessment  Last BM:  07/05/17  Height:   Ht Readings from Last 1 Encounters:  07/02/17 5\' 5"  (1.651 m)    Weight:   Wt Readings from Last 1 Encounters:  07/06/17 142 lb 10.2  oz (64.7 kg)    Ideal Body Weight:  59.1 kg  BMI:  Body mass index is 23.74 kg/m.  Estimated Nutritional Needs:   Kcal:  1700-1900 kcal  Protein:  80-95 grams  Fluid:  > 1.7 L   Hope Budds, Dietetic Intern

## 2017-07-07 LAB — BASIC METABOLIC PANEL
ANION GAP: 9 (ref 5–15)
BUN: 16 mg/dL (ref 6–20)
CHLORIDE: 102 mmol/L (ref 101–111)
CO2: 22 mmol/L (ref 22–32)
Calcium: 7.6 mg/dL — ABNORMAL LOW (ref 8.9–10.3)
Creatinine, Ser: 1.17 mg/dL (ref 0.61–1.24)
GFR calc non Af Amer: >60 mL/min (ref >60)
Glucose, Bld: 96 mg/dL (ref 65–99)
Potassium: 3.7 mmol/L (ref 3.5–5.1)
Sodium: 133 mmol/L — ABNORMAL LOW (ref 135–145)

## 2017-07-07 LAB — CBC
HCT: 33.9 % — ABNORMAL LOW (ref 39.0–52.0)
HEMOGLOBIN: 11.5 g/dL — AB (ref 13.0–17.0)
MCH: 25.8 pg — AB (ref 26.0–34.0)
MCHC: 33.9 g/dL (ref 30.0–36.0)
MCV: 76.2 fL — AB (ref 78.0–100.0)
Platelets: 339 10*3/uL (ref 150–400)
RBC: 4.45 MIL/uL (ref 4.22–5.81)
RDW: 14.8 % (ref 11.5–15.5)
WBC: 20 10*3/uL — ABNORMAL HIGH (ref 4.0–10.5)

## 2017-07-07 LAB — CK: Total CK: 1014 U/L — ABNORMAL HIGH (ref 49–397)

## 2017-07-07 NOTE — Progress Notes (Signed)
PT Cancellation Note  Patient Details Name: Sigfredo Schreier MRN: [id number] DOB: 11/12/1955   Cancelled Treatment:    Reason Eval/Treat Not Completed: Pain limiting ability to participate Pt reporting increased pain and drainage from incision site, however, did not note drainage. Reports he did not want to get up with PT today. Will follow up as schedule allows.   Leighton Ruff, PT, DPT  Acute Rehabilitation Services  Pager: [phone]    Rudean Hitt 07/07/2017, 11:18 AM

## 2017-07-07 NOTE — Progress Notes (Addendum)
Center TEAM 1 - Stepdown/ICU TEAM  Charlyne Mom  EGB:[id number] DOB: [birth year] DOA: 06/28/2017 PCP: Patient, No Pcp Per    Brief Narrative:  62yo M w/ a hx of Tobacco Abuse, HTN, and CKD Stage 3 who presented to the ED on 4/4 with fever, chills, nonproductive cough, and chest pain. WBC 24.1. CXR with interstitial lung disease with pleural scarring. EKG with ST elevation in the anterior leads. Cardiology was consulted and felt this was due to acute pericarditis. Started on ASA. Placed on azithromycin and rocephin for probable clinical pneumonia. [facility].  During this admission an MRI Brain obtained d/u to c/o dizziness noted 3 acute areas of CVA in the left cerebellum as well as evidence of old small bilateral cerebellar infarcts and old basal ganglia/thalamic infarcts.  Blood cultures returned positive for Pnemococcal bacteremia, raising a concern for bacterial endocarditis.   On 4/7 patient the pt developed diffuse abdominal pain with hypotension in the 70s. CT A/P noted a 6.6 cm diameter aortic aneurysm with findings concerning for rupture. He was taken to OR for endovascular repair of ruptured AAA.  In the PACU he was noted to have absent fem pulses and was taken back to OR for bilateral iliofemoral endarterectomy, BLE thromboembolectomies  Significant Events: 4/4 admit   4/6 MRI noted 3 small acute CVAs 4/7 acute AAA rupture - emergent repair in OR - return to OR w/ B LE thrombosis  4/11 TR[facility] assumed care 4/11 PICC placed   Subjective: Having some increased drainage at L groin incision site, but Vasc does not feel this is problematic.  Poor appetite and little intake.  Has been up around room only.  Tells me he will be "all alone" at home w/o any consistent help available during the day.  Denies cp, sob, n/v.  Some persisting abdom pain but slowly improving.    Assessment & Plan:  Ruptured Aortic Aneurysm s/p stent placement - large RPH  S/p emergent endovascular repair 4/7 w/  B iliofemoral endarterectomy and subsequent B LE thromboembolectomies - care per Vascular Surgery, who feel he is progressing nicely    S/P Bilateral LE Thrombectomies   CTA 4/9 shows thrombus in the right limb of the graft - Vascular directing care of this issue    Aneurysm of the ascending thoracic aorta measuring at least 4.4cm Noted on CT chest - will need serial f/u   Strep pneumo bacteremia - concern for Bacterial Endocarditis - Pericarditis  No vegetation on TTE - abx per ID - decision has been made to forgo TEE - f/u blood cx on 06/30/17 negative x5 days - WBC increasing - assure this improves prior to d/c, or will need to investigate further   LLL PNA - presumed Strep pneumo On abx coverage - f/u CXR suggests improvement - no persisting clinical sx   3 acute LEFT cerebellar infarcts  Pt reported recent dizziness at admit - septic emboli v/s hypotension - Neurology following - MRI also noted old small bilateral cerebellar infarcts + old basal ganglia and thalamus infarcts - Neuro suggests ASA 81mg  daily  RLE weakness and numbness - PVD more consistent with PVD per Neurology - essentially resolved at this time   Acute hypoxic respiratory failure  in setting hemorrhagic shock r/t ruptured AAA  extubated 4/8 - has now been weaned to RA   ILD per CT will need outpatient follow up for ILD  Hemorrhagic Shock / Acute blood loss anemia in setting of Ruptured AAA Hgb cont to  improve  Recent Labs  Lab 07/04/17 0359 07/05/17 0515 07/06/17 0330 07/07/17 0438  HGB 9.9* 10.5* 10.7* 11.5*    Intracranial vascular occlusion / stenosis MRA noted right M1 occlusion, likely chronic, bilateral PCA stenosis, and terminal right vertebral artery stenosis - avoid hypotension   HTN Goal SBP 110-140 per Neuro recs - wish to avoid extremes of hypotension or hypertension - BP in range at this time    Indeterminate L adrenal nodule  Noted on CTa chest/abd/pelvis 4/9 - will need f/u imaging as  outpt  HLD  On Lipitor     Chronic Kidney Disease Stage 3  Recent Labs  Lab 07/04/17 0359 07/04/17 1554 07/05/17 0515 07/06/17 0330 07/07/17 0438  CREATININE 1.22 1.24 1.21 1.15 1.17   Mild Hypokalemia  Replaced  DVT prophylaxis: SCDs + lovenox  Code Status: FULL CODE Family Communication: no family present at time of exam  Disposition Plan: change status to med surg - PT/OT mobilization - follow WBC and other signs of occult infection - probable d/c home 4/15 if no infxn w/u required   Consultants:  Vasc Surgery  PCCM ID  Neurology   Antimicrobials:  Azithromycin 4/4  Rocephin 4/4 >  Ancef 4/7   Objective: Blood pressure 129/77, pulse 72, temperature 98.2 F (36.8 C), temperature source Oral, resp. rate 19, height 5\' 5"  (1.651 m), weight 63.7 kg (140 lb 6.9 oz), SpO2 97 %.  Intake/Output Summary (Last 24 hours) at 07/07/2017 1030 Last data filed at 07/07/2017 0900 Gross per 24 hour  Intake 350 ml  Output 1825 ml  Net -1475 ml   Filed Weights   07/05/17 0500 07/06/17 0518 07/07/17 0313  Weight: 64.8 kg (142 lb 12.8 oz) 64.7 kg (142 lb 10.2 oz) 63.7 kg (140 lb 6.9 oz)    Examination: General: No acute respiratory distress - alert and pleasant  Lungs: CTA th/o w/ no focal crackles or wheezing  Cardiovascular: RRR - no M  Abdomen: less tender to palpation in L upper and lower quad/flank, no rebound, BS+, not distended Extremities: No edema B LE - both feet warm to touch   CBC: Recent Labs  Lab 07/01/17 0654 07/01/17 1632  07/03/17 1854  07/05/17 0515 07/06/17 0330 07/07/17 0438  WBC 19.4* 22.8*   < > 15.7*   < > 13.9* 16.2* 20.0*  NEUTROABS 14.7* 19.9*  --  12.5*  --   --   --   --   HGB 12.9* 12.4*   < > 10.3*   < > 10.5* 10.7* 11.5*  HCT 37.7* 36.4*   < > 35.5*   < > 31.0* 31.8* 33.9*  MCV 74.8* 75.1*   < > 88.5   < > 77.1* 77.4* 76.2*  PLT 138* 155   < > 204   < > 207 253 339   < > = values in this interval not displayed.   Basic Metabolic  Panel: Recent Labs  Lab 07/02/17 0012 07/03/17 0300  07/05/17 0515 07/06/17 0330 07/07/17 0438  NA  --  136   < > 138 137 133*  K  --  3.4*   < > 3.5 3.2* 3.7  CL  --  100*   < > 105 105 102  CO2  --  28   < > 25 24 22   GLUCOSE  --  144*   < > 121* 108* 96  BUN  --  30*   < > 17 17 16   CREATININE  --  1.32*   < >  1.21 1.15 1.17  CALCIUM  --  6.8*   < > 7.5* 7.7* 7.6*  MG 1.9 2.2  --   --  2.2  --   PHOS 4.1 1.6*  --   --  2.9  --    < > = values in this interval not displayed.   GFR: Estimated Creatinine Clearance: 56.2 mL/min (by C-G formula based on SCr of 1.17 mg/dL).  Liver Function Tests: Recent Labs  Lab 07/01/17 1632 07/04/17 0359 07/06/17 0330  AST 40 151* 81*  ALT 27 50 60  ALKPHOS 81 92 102  BILITOT 1.2 0.6 1.0  PROT 6.4* 5.7* 6.0*  ALBUMIN 2.4* 2.1* 2.1*    Coagulation Profile: Recent Labs  Lab 07/01/17 1632  INR 1.04    Cardiac Enzymes: Recent Labs  Lab 07/04/17 2041 07/05/17 0515 07/05/17 1418 07/06/17 0330 07/07/17 0438  CKTOTAL 3,399* 2,734* 2,091* 1,207* 1,014*    HbA1C: Hgb A1c MFr Bld  Date/Time Value Ref Range Status  06/29/2017 03:20 AM 5.6 4.8 - 5.6 % Final    Comment:    (NOTE) Pre diabetes:          5.7%-6.4% Diabetes:              >6.4% Glycemic control for   <7.0% adults with diabetes     CBG: Recent Labs  Lab 07/03/17 2333 07/04/17 0543 07/04/17 1207 07/04/17 1845 07/04/17 2314  GLUCAP 161* 167* 158* 100* 132*    Recent Results (from the past 240 hour(s))  Culture, blood (Routine X 2) w Reflex to ID Panel     Status: Abnormal   Collection Time: 06/28/17  9:10 PM  Result Value Ref Range Status   Specimen Description   Final    BLOOD RIGHT ANTECUBITAL Performed at [facility], [place] [address], [place], [place] [address]    Special Requests   Final    BOTTLES DRAWN AEROBIC AND ANAEROBIC Blood Culture adequate volume Performed at [facility] [address],  [place], [place] [address]    Culture  Setup Time   Final    GRAM POSITIVE COCCI IN CHAINS IN BOTH AEROBIC AND ANAEROBIC BOTTLES CRITICAL VALUE NOTED.  VALUE IS CONSISTENT WITH PREVIOUSLY REPORTED AND CALLED VALUE.    Culture (A)  Final    STREPTOCOCCUS PNEUMONIAE SUSCEPTIBILITIES PERFORMED ON PREVIOUS CULTURE WITHIN THE LAST 5 DAYS. Performed at [facility], [place] [address] [place], [place] [address]    Report Status 07/01/2017 FINAL  Final  Culture, blood (Routine X 2) w Reflex to ID Panel     Status: Abnormal   Collection Time: 06/28/17  9:10 PM  Result Value Ref Range Status   Specimen Description   Final    BLOOD LEFT HAND Performed at [facility] [address] [place], [place] [address]    Special Requests   Final    BOTTLES DRAWN AEROBIC AND ANAEROBIC Blood Culture adequate volume Performed at [facility] [address], [place], [place] [address]    Culture  Setup Time   Final    GRAM POSITIVE COCCI IN CHAINS IN BOTH AEROBIC AND ANAEROBIC BOTTLES CRITICAL RESULT CALLED TO, READ BACK BY AND VERIFIED WITH: D. Wofford PharmD 14:45 06/29/17 ([email or web address]) Performed at [facility], [place] [address], [place], [place] [address]    Culture STREPTOCOCCUS PNEUMONIAE (A)  Final   Report Status 07/01/2017 FINAL  Final   Organism ID, Bacteria STREPTOCOCCUS PNEUMONIAE  Final      Susceptibility   Streptococcus pneumoniae - MIC*    ERYTHROMYCIN <=0.12 SENSITIVE Sensitive     LEVOFLOXACIN 0.5 SENSITIVE Sensitive     PENICILLIN (meningitis) 0.25 RESISTANT Resistant     PENICILLIN (non-meningitis) 0.25 SENSITIVE Sensitive     CEFTRIAXONE (non-meningitis) <=0.12 SENSITIVE Sensitive     CEFTRIAXONE (meningitis) <=0.12 SENSITIVE Sensitive     * STREPTOCOCCUS PNEUMONIAE  Blood Culture ID Panel (Reflexed)     Status: Abnormal   Collection Time: 06/28/17  9:10 PM  Result Value Ref Range Status   Enterococcus species NOT DETECTED NOT DETECTED  Final   Listeria monocytogenes NOT DETECTED NOT DETECTED Final   Staphylococcus species NOT DETECTED NOT DETECTED Final   Staphylococcus aureus NOT DETECTED NOT DETECTED Final   Streptococcus species DETECTED (A) NOT DETECTED Final    Comment: CRITICAL RESULT CALLED TO, READ BACK BY AND VERIFIED WITH: D. Wofford PharmD 14:45 06/29/17 ([email or web address])    Streptococcus agalactiae NOT DETECTED NOT DETECTED Final   Streptococcus pneumoniae DETECTED (A) NOT DETECTED Final    Comment: CRITICAL RESULT CALLED TO, READ BACK BY AND VERIFIED WITH: D. Wofford PharmD 14:45 06/29/17 ([email or web address])    Streptococcus pyogenes NOT DETECTED NOT DETECTED Final   Acinetobacter baumannii NOT DETECTED NOT DETECTED Final   Enterobacteriaceae species NOT DETECTED NOT DETECTED Final   Enterobacter cloacae complex NOT DETECTED NOT DETECTED Final   Escherichia coli NOT DETECTED NOT DETECTED Final   Klebsiella oxytoca NOT DETECTED NOT DETECTED Final   Klebsiella pneumoniae NOT DETECTED NOT DETECTED Final   Proteus species NOT DETECTED NOT DETECTED Final   Serratia marcescens NOT DETECTED NOT DETECTED Final   Haemophilus influenzae NOT DETECTED NOT DETECTED Final   Neisseria meningitidis NOT DETECTED NOT DETECTED Final   Pseudomonas aeruginosa NOT DETECTED NOT DETECTED Final   Candida albicans NOT DETECTED NOT DETECTED Final   Candida glabrata NOT DETECTED NOT DETECTED Final   Candida krusei NOT DETECTED NOT DETECTED Final   Candida parapsilosis NOT DETECTED NOT DETECTED Final   Candida tropicalis NOT DETECTED NOT DETECTED Final    Comment: Performed at [facility], [place] [address] [place], [place] [address]  Culture, blood (routine x 2)     Status: None   Collection Time: 06/30/17  2:18 PM  Result Value Ref Range Status   Specimen Description BLOOD RIGHT ARM  Final   Special Requests   Final    BOTTLES DRAWN AEROBIC AND ANAEROBIC Blood Culture adequate volume   Culture   Final    NO GROWTH 5 DAYS Performed at [facility], [address] [place], [place] [address]    Report Status 07/05/2017 FINAL  Final  Culture, blood (routine x 2)     Status: None   Collection Time: 06/30/17  2:23 PM  Result Value Ref Range Status   Specimen Description BLOOD RIGHT FOREARM  Final   Special Requests   Final    BOTTLES DRAWN AEROBIC AND ANAEROBIC Blood Culture adequate volume   Culture   Final    NO GROWTH 5 DAYS Performed at [facility], [place] [address] [place] [address] [place], [place] [address]    Report Status 07/05/2017 FINAL  Final  MRSA PCR Screening     Status: None   Collection Time: 07/01/17 11:42 PM  Result Value Ref Range Status   MRSA by PCR NEGATIVE NEGATIVE Final    Comment:        The GeneXpert MRSA Assay (FDA  approved for NASAL specimens only), is one component of a comprehensive MRSA colonization surveillance program. It is not intended to diagnose MRSA infection nor to guide or monitor treatment for MRSA infections. Performed at [facility], [place] [address] [place], [place] [address]   Culture, respiratory (NON-Expectorated)     Status: None   Collection Time: 07/02/17  4:45 AM  Result Value Ref Range Status   Specimen Description TRACHEAL ASPIRATE  Final   Special Requests NONE  Final   Gram Stain   Final    FEW WBC PRESENT, PREDOMINANTLY PMN FEW GRAM NEGATIVE RODS FEW GRAM POSITIVE COCCI IN PAIRS RARE GRAM NEGATIVE COCCOBACILLI    Culture   Final    RARE Consistent with normal respiratory flora. Performed at [facility], [place] [address], [place], [place] [address]    Report Status 07/04/2017 FINAL  Final     Scheduled Meds: . aspirin EC  81 mg Oral Daily  . atorvastatin  40 mg Oral q1800  . Chlorhexidine Gluconate Cloth  6 each Topical Daily  . enoxaparin (LOVENOX) injection  30 mg Subcutaneous Q12H  . feeding supplement (ENSURE ENLIVE)  237 mL Oral TID BM  . multivitamin with minerals  1 tablet Oral Daily  . pantoprazole  40 mg Oral Q1200  . senna-docusate   2 tablet Oral QHS     LOS: 9 days   Cherene Altes, MD [facility] Office  [phone] Pager - Text Page per Amion as per below:  On-Call/Text Page:      [name]      password [email or web address]  If 7PM-7AM, please contact night-coverage [email or web address] Password [email or web address] 07/07/2017, 10:30 AM

## 2017-07-07 NOTE — Progress Notes (Signed)
  Progress Note    07/07/2017 10:05 AM 6 Days Post-Op  Subjective: He has no complaints today  Vitals:   07/07/17 0400 07/07/17 0736  BP:  129/77  Pulse: 75 72  Resp:  19  Temp:  98.2 F (36.8 C)  SpO2: 99% 97%    Physical Exam: Abdomen is soft Nonlabored respirations Right groin incision clean dry and intact, left had minimal drainage this morning but there is no expressible drainage at this time incision appears intact Palpable pedal pulses   CBC    Component Value Date/Time   WBC 20.0 (H) 07/07/2017 0438   RBC 4.45 07/07/2017 0438   HGB 11.5 (L) 07/07/2017 0438   HCT 33.9 (L) 07/07/2017 0438   PLT 339 07/07/2017 0438   MCV 76.2 (L) 07/07/2017 0438   MCV 77.8 (A) 06/28/2017 1335   MCH 25.8 (L) 07/07/2017 0438   MCHC 33.9 07/07/2017 0438   RDW 14.8 07/07/2017 0438   LYMPHSABS 1.9 07/03/2017 1854   MONOABS 1.3 (H) 07/03/2017 1854   EOSABS 0.0 07/03/2017 1854   BASOSABS 0.0 07/03/2017 1854    BMET    Component Value Date/Time   NA 133 (L) 07/07/2017 0438   K 3.7 07/07/2017 0438   CL 102 07/07/2017 0438   CO2 22 07/07/2017 0438   GLUCOSE 96 07/07/2017 0438   BUN 16 07/07/2017 0438   CREATININE 1.17 07/07/2017 0438   CALCIUM 7.6 (L) 07/07/2017 0438   GFRNONAA >60 07/07/2017 0438   GFRAA >60 07/07/2017 0438    INR    Component Value Date/Time   INR 1.04 07/01/2017 1632     Intake/Output Summary (Last 24 hours) at 07/07/2017 1005 Last data filed at 07/07/2017 0900 Gross per 24 hour  Intake 340 ml  Output 1825 ml  Net -1485 ml     Assessment:  62 y.o. male is s/p endovascular repair of ruptured abdominal aortic aneurysm and bilateral iliofemoral endarterectomies 6 Days Post-Op  Plan: Appears stable from vascular standpoint at this time with palpable pedal pulses Continue antibiotics from infectious disease standpoint Diet as tolerated  Dagoberto Nealy C. Donzetta Matters, MD [facility] Office: [phone] Pager:  [phone]  07/07/2017 10:05 AM

## 2017-07-08 ENCOUNTER — Inpatient Hospital Stay (HOSPITAL_COMMUNITY): Payer: Private Health Insurance - Indemnity

## 2017-07-08 LAB — CBC
HEMATOCRIT: 31.6 % — AB (ref 39.0–52.0)
HEMOGLOBIN: 10.8 g/dL — AB (ref 13.0–17.0)
MCH: 26 pg (ref 26.0–34.0)
MCHC: 34.2 g/dL (ref 30.0–36.0)
MCV: 76.1 fL — ABNORMAL LOW (ref 78.0–100.0)
Platelets: 371 10*3/uL (ref 150–400)
RBC: 4.15 MIL/uL — ABNORMAL LOW (ref 4.22–5.81)
RDW: 15 % (ref 11.5–15.5)
WBC: 20.1 10*3/uL — ABNORMAL HIGH (ref 4.0–10.5)

## 2017-07-08 LAB — URINALYSIS, ROUTINE W REFLEX MICROSCOPIC
BILIRUBIN URINE: NEGATIVE
Glucose, UA: NEGATIVE mg/dL
HGB URINE DIPSTICK: NEGATIVE
Ketones, ur: NEGATIVE mg/dL
Leukocytes, UA: NEGATIVE
NITRITE: NEGATIVE
PH: 6 (ref 5.0–8.0)
Protein, ur: NEGATIVE mg/dL
SPECIFIC GRAVITY, URINE: 1.013 (ref 1.005–1.030)

## 2017-07-08 MED ORDER — IOPAMIDOL (ISOVUE-300) INJECTION 61%
INTRAVENOUS | Status: AC
  Filled 2017-07-08: qty 100

## 2017-07-08 MED ORDER — SODIUM CHLORIDE 0.9 % IV BOLUS
500.0000 mL | Freq: Once | INTRAVENOUS | Status: AC
  Administered 2017-07-08: 500 mL via INTRAVENOUS

## 2017-07-08 MED ORDER — SODIUM CHLORIDE 0.9 % IV SOLN
INTRAVENOUS | Status: DC
  Administered 2017-07-08: 75 mL/h via INTRAVENOUS
  Administered 2017-07-08: 14:00:00 via INTRAVENOUS

## 2017-07-08 MED ORDER — IOPAMIDOL (ISOVUE-300) INJECTION 61%
INTRAVENOUS | Status: AC
  Administered 2017-07-08: 30 mL
  Filled 2017-07-08: qty 30

## 2017-07-08 NOTE — Progress Notes (Signed)
  Progress Note    07/08/2017 10:38 AM 7 Days Post-Op  Subjective: Was nauseated last night but that is now improved and he is having some abdominal pain.  Vitals:   07/08/17 0326 07/08/17 0759  BP: 127/79 123/74  Pulse: 86 86  Resp: 20 18  Temp: 99.1 F (37.3 C) 98.8 F (37.1 C)  SpO2: 93% 94%    Physical Exam: Awake and alert Abdomen is soft with mild tenderness to palpation Bilateral groins are clean dry and intact Palpable dorsalis pedis pulses bilaterally  CBC    Component Value Date/Time   WBC 20.1 (H) 07/08/2017 0420   RBC 4.15 (L) 07/08/2017 0420   HGB 10.8 (L) 07/08/2017 0420   HCT 31.6 (L) 07/08/2017 0420   PLT 371 07/08/2017 0420   MCV 76.1 (L) 07/08/2017 0420   MCV 77.8 (A) 06/28/2017 1335   MCH 26.0 07/08/2017 0420   MCHC 34.2 07/08/2017 0420   RDW 15.0 07/08/2017 0420   LYMPHSABS 1.9 07/03/2017 1854   MONOABS 1.3 (H) 07/03/2017 1854   EOSABS 0.0 07/03/2017 1854   BASOSABS 0.0 07/03/2017 1854    BMET    Component Value Date/Time   NA 133 (L) 07/07/2017 0438   K 3.7 07/07/2017 0438   CL 102 07/07/2017 0438   CO2 22 07/07/2017 0438   GLUCOSE 96 07/07/2017 0438   BUN 16 07/07/2017 0438   CREATININE 1.17 07/07/2017 0438   CALCIUM 7.6 (L) 07/07/2017 0438   GFRNONAA >60 07/07/2017 0438   GFRAA >60 07/07/2017 0438    INR    Component Value Date/Time   INR 1.04 07/01/2017 1632     Intake/Output Summary (Last 24 hours) at 07/08/2017 1038 Last data filed at 07/08/2017 1000 Gross per 24 hour  Intake 1000 ml  Output 550 ml  Net 450 ml     Assessment:  62 y.o. male is s/p endovascular repair of abdominal aortic aneurysm bilateral iliofemoral endarterectomies now postoperative day #7  Plan: Remained stable from a vascular standpoint with palpable pulses distally Diet as tolerated Antibiotics per infectious disease.   Araiya Tilmon C. Donzetta Matters, MD [facility] Office: [phone] Pager:  [phone]  07/08/2017 10:38 AM

## 2017-07-08 NOTE — Progress Notes (Signed)
[place] TEAM 1 - Stepdown/ICU TEAM  Thomas [name]  RSW:[id number] DOB: Apr 24, 1955 DOA: 06/28/2017 PCP: Patient, No Pcp Per    Brief Narrative:  62yo M w/ a hx of Tobacco Abuse, HTN, and CKD Stage 3 who presented to the ED on 4/4 with fever, chills, nonproductive cough, and chest pain. WBC 24.1. CXR with interstitial lung disease with pleural scarring. EKG with ST elevation in the anterior leads. Cardiology was consulted and felt this was due to acute pericarditis. Started on ASA. Placed on azithromycin and rocephin for probable clinical pneumonia. Admitted to [facility].  During this admission an MRI Brain obtained d/u to c/o dizziness noted 3 acute areas of CVA in the left cerebellum as well as evidence of old small bilateral cerebellar infarcts and old basal ganglia/thalamic infarcts.  Blood cultures returned positive for Pnemococcal bacteremia, raising a concern for bacterial endocarditis.   On 4/7 patient the pt developed diffuse abdominal pain with hypotension in the 70s. CT A/P noted a 6.6 cm diameter aortic aneurysm with findings concerning for rupture. He was taken to OR for endovascular repair of ruptured AAA.  In the PACU he was noted to have absent fem pulses and was taken back to OR for bilateral iliofemoral endarterectomy, BLE thromboembolectomies  Significant Events: 4/4 admit   4/6 MRI noted 3 small acute CVAs 4/7 acute AAA rupture - emergent repair in OR - return to OR w/ B LE thrombosis  4/11 TRH assumed care 4/11 PICC placed   Subjective: Pt c/o ongoing low grade fever.  Feels hot and at times sweaty.  Poor appetite.  No cp or sob.  Persistent L sided flank/abdom pain.  Feels weak in general.    Assessment & Plan:  Low grade temp elevation / FUO - worsening leukocytosis  Worrisome for occult nidus of infection - repeat CT abdom to r/o abscess formation among RPH - follow fever curve - repeat blood cx and UA - CXR in AM if no other source noted, but has no other sx to suggest PNA  - L groin wound looks benign to me today   Ruptured Aortic Aneurysm s/p stent placement - large RPH  S/p emergent endovascular repair 4/7 w/ B iliofemoral endarterectomy and subsequent B LE thromboembolectomies - care per Vascular Surgery, who feel he is progressing nicely    S/P Bilateral LE Thrombectomies   CTA 4/9 shows thrombus in the right limb of the graft - Vascular directing care of this issue    Aneurysm of the ascending thoracic aorta measuring at least 4.4cm Noted on CT chest - will need serial f/u   Strep pneumo bacteremia - concern for Bacterial Endocarditis - Pericarditis  No vegetation on TTE - abx per ID - decision has been made to forgo TEE - f/u blood cx on 06/30/17 negative x5 days -see discussion on FUO above   LLL PNA - presumed Strep pneumo On abx coverage - f/u CXR suggests improvement - no persisting clinical sx   3 acute LEFT cerebellar infarcts  Pt reported recent dizziness at admit - septic emboli v/s hypotension - Neurology following - MRI also noted old small bilateral cerebellar infarcts + old basal ganglia and thalamus infarcts - Neuro suggests ASA 81mg  daily  RLE weakness and numbness - PVD more consistent with PVD per Neurology - essentially resolved at this time - walking w/ PT/OT   Acute hypoxic respiratory failure  in setting hemorrhagic shock r/t ruptured AAA  extubated 4/8 - has been weaned to RA  ILD per CT will need outpatient follow up for ILD  Hemorrhagic Shock / Acute blood loss anemia in setting of Ruptured AAA Hgb stable - following trend   Recent Labs  Lab 07/05/17 0515 07/06/17 0330 07/07/17 0438 07/08/17 0420  HGB 10.5* 10.7* 11.5* 10.8*    Intracranial vascular occlusion / stenosis MRA noted right M1 occlusion, likely chronic, bilateral PCA stenosis, and terminal right vertebral artery stenosis - avoid hypotension   HTN Goal SBP 110-140 per Neuro recs - wish to avoid extremes of hypotension or hypertension - BP trending  down - hydrate      Indeterminate L adrenal nodule  Noted on CTa chest/abd/pelvis 4/9 - will need f/u imaging as outpt  HLD  On Lipitor     Chronic Kidney Disease Stage 3  crt is stable at this time  Recent Labs  Lab 07/04/17 0359 07/04/17 1554 07/05/17 0515 07/06/17 0330 07/07/17 0438  CREATININE 1.22 1.24 1.21 1.15 1.17    DVT prophylaxis: SCDs + lovenox  Code Status: FULL CODE Family Communication: no family present at time of exam  Disposition Plan: PT/OT mobilization - infection w/u underway    Consultants:  Vasc Surgery  PCCM ID  Neurology   Antimicrobials:  Azithromycin 4/4  Rocephin 4/4 >  Ancef 4/7   Objective: Blood pressure 123/74, pulse 86, temperature 98.8 F (37.1 C), temperature source Oral, resp. rate 18, height 5\' 5"  (1.651 m), weight 57.1 kg (125 lb 12.8 oz), SpO2 94 %.  Intake/Output Summary (Last 24 hours) at 07/08/2017 1110 Last data filed at 07/08/2017 1000 Gross per 24 hour  Intake 760 ml  Output 550 ml  Net 210 ml   Filed Weights   07/06/17 0518 07/07/17 0313 07/08/17 0800  Weight: 64.7 kg (142 lb 10.2 oz) 63.7 kg (140 lb 6.9 oz) 57.1 kg (125 lb 12.8 oz)    Examination: General: No acute respiratory distress - alert & oriented  Lungs: CTA th/o - no wheezing - no focal crackles  Cardiovascular: RRR w/o M or rub  Abdomen: modestly tender to palpation in L lower quad/flank, no rebound, BS+, not distended Extremities: no edema B LE - B feet warm to touch   CBC: Recent Labs  Lab 07/01/17 1632  07/03/17 1854  07/06/17 0330 07/07/17 0438 07/08/17 0420  WBC 22.8*   < > 15.7*   < > 16.2* 20.0* 20.1*  NEUTROABS 19.9*  --  12.5*  --   --   --   --   HGB 12.4*   < > 10.3*   < > 10.7* 11.5* 10.8*  HCT 36.4*   < > 35.5*   < > 31.8* 33.9* 31.6*  MCV 75.1*   < > 88.5   < > 77.4* 76.2* 76.1*  PLT 155   < > 204   < > 253 339 371   < > = values in this interval not displayed.   Basic Metabolic Panel: Recent Labs  Lab 07/02/17 0012  07/03/17 0300  07/05/17 0515 07/06/17 0330 07/07/17 0438  NA  --  136   < > 138 137 133*  K  --  3.4*   < > 3.5 3.2* 3.7  CL  --  100*   < > 105 105 102  CO2  --  28   < > 25 24 22   GLUCOSE  --  144*   < > 121* 108* 96  BUN  --  30*   < > 17 17 16   CREATININE  --  1.32*   < > 1.21 1.15 1.17  CALCIUM  --  6.8*   < > 7.5* 7.7* 7.6*  MG 1.9 2.2  --   --  2.2  --   PHOS 4.1 1.6*  --   --  2.9  --    < > = values in this interval not displayed.   GFR: Estimated Creatinine Clearance: 52.2 mL/min (by C-G formula based on SCr of 1.17 mg/dL).  Liver Function Tests: Recent Labs  Lab 07/01/17 1632 07/04/17 0359 07/06/17 0330  AST 40 151* 81*  ALT 27 50 60  ALKPHOS 81 92 102  BILITOT 1.2 0.6 1.0  PROT 6.4* 5.7* 6.0*  ALBUMIN 2.4* 2.1* 2.1*    Coagulation Profile: Recent Labs  Lab 07/01/17 1632  INR 1.04    Cardiac Enzymes: Recent Labs  Lab 07/04/17 2041 07/05/17 0515 07/05/17 1418 07/06/17 0330 07/07/17 0438  CKTOTAL 3,399* 2,734* 2,091* 1,207* 1,014*    HbA1C: Hgb A1c MFr Bld  Date/Time Value Ref Range Status  06/29/2017 03:20 AM 5.6 4.8 - 5.6 % Final    Comment:    (NOTE) Pre diabetes:          5.7%-6.4% Diabetes:              >6.4% Glycemic control for   <7.0% adults with diabetes     CBG: Recent Labs  Lab 07/03/17 2333 07/04/17 0543 07/04/17 1207 07/04/17 1845 07/04/17 2314  GLUCAP 161* 167* 158* 100* 132*    Recent Results (from the past 240 hour(s))  Culture, blood (Routine X 2) w Reflex to ID Panel     Status: Abnormal   Collection Time: 06/28/17  9:10 PM  Result Value Ref Range Status   Specimen Description   Final    BLOOD RIGHT ANTECUBITAL Performed at [facility], [place] [address], [place], [place] [address]    Special Requests   Final    BOTTLES DRAWN AEROBIC AND ANAEROBIC Blood Culture adequate volume Performed at [facility] [address] [place], [place] [address]    Culture  Setup Time    Final    GRAM POSITIVE COCCI IN CHAINS IN BOTH AEROBIC AND ANAEROBIC BOTTLES CRITICAL VALUE NOTED.  VALUE IS CONSISTENT WITH PREVIOUSLY REPORTED AND CALLED VALUE.    Culture (A)  Final    STREPTOCOCCUS PNEUMONIAE SUSCEPTIBILITIES PERFORMED ON PREVIOUS CULTURE WITHIN THE LAST 5 DAYS. Performed at [facility], [place] [address] [place], [place] [address]    Report Status 07/01/2017 FINAL  Final  Culture, blood (Routine X 2) w Reflex to ID Panel     Status: Abnormal   Collection Time: 06/28/17  9:10 PM  Result Value Ref Range Status   Specimen Description   Final    BLOOD LEFT HAND Performed at [facility] [address] [place], [place] [address]    Special Requests   Final    BOTTLES DRAWN AEROBIC AND ANAEROBIC Blood Culture adequate volume Performed at [facility] [address], [place], [place] [address]    Culture  Setup Time   Final    GRAM POSITIVE COCCI IN CHAINS IN BOTH AEROBIC AND ANAEROBIC BOTTLES CRITICAL RESULT CALLED TO, READ BACK BY AND VERIFIED WITH: D. Wofford PharmD 14:45 06/29/17 ([email or web address]) Performed at [facility], [place] [address], [place], [place] [address]    Culture STREPTOCOCCUS PNEUMONIAE (A)  Final   Report Status 07/01/2017 FINAL  Final   Organism  ID, Bacteria STREPTOCOCCUS PNEUMONIAE  Final      Susceptibility   Streptococcus pneumoniae - MIC*    ERYTHROMYCIN <=0.12 SENSITIVE Sensitive     LEVOFLOXACIN 0.5 SENSITIVE Sensitive     PENICILLIN (meningitis) 0.25 RESISTANT Resistant     PENICILLIN (non-meningitis) 0.25 SENSITIVE Sensitive     CEFTRIAXONE (non-meningitis) <=0.12 SENSITIVE Sensitive     CEFTRIAXONE (meningitis) <=0.12 SENSITIVE Sensitive     * STREPTOCOCCUS PNEUMONIAE  Blood Culture ID Panel (Reflexed)     Status: Abnormal   Collection Time: 06/28/17  9:10 PM  Result Value Ref Range Status   Enterococcus species NOT DETECTED NOT DETECTED Final   Listeria monocytogenes NOT DETECTED NOT  DETECTED Final   Staphylococcus species NOT DETECTED NOT DETECTED Final   Staphylococcus aureus NOT DETECTED NOT DETECTED Final   Streptococcus species DETECTED (A) NOT DETECTED Final    Comment: CRITICAL RESULT CALLED TO, READ BACK BY AND VERIFIED WITH: D. Wofford PharmD 14:45 06/29/17 ([email or web address])    Streptococcus agalactiae NOT DETECTED NOT DETECTED Final   Streptococcus pneumoniae DETECTED (A) NOT DETECTED Final    Comment: CRITICAL RESULT CALLED TO, READ BACK BY AND VERIFIED WITH: D. Wofford PharmD 14:45 06/29/17 ([email or web address])    Streptococcus pyogenes NOT DETECTED NOT DETECTED Final   Acinetobacter baumannii NOT DETECTED NOT DETECTED Final   Enterobacteriaceae species NOT DETECTED NOT DETECTED Final   Enterobacter cloacae complex NOT DETECTED NOT DETECTED Final   Escherichia coli NOT DETECTED NOT DETECTED Final   Klebsiella oxytoca NOT DETECTED NOT DETECTED Final   Klebsiella pneumoniae NOT DETECTED NOT DETECTED Final   Proteus species NOT DETECTED NOT DETECTED Final   Serratia marcescens NOT DETECTED NOT DETECTED Final   Haemophilus influenzae NOT DETECTED NOT DETECTED Final   Neisseria meningitidis NOT DETECTED NOT DETECTED Final   Pseudomonas aeruginosa NOT DETECTED NOT DETECTED Final   Candida albicans NOT DETECTED NOT DETECTED Final   Candida glabrata NOT DETECTED NOT DETECTED Final   Candida krusei NOT DETECTED NOT DETECTED Final   Candida parapsilosis NOT DETECTED NOT DETECTED Final   Candida tropicalis NOT DETECTED NOT DETECTED Final    Comment: Performed at [facility], [place] [address], [place], [place] [address]  Culture, blood (routine x 2)     Status: None   Collection Time: 06/30/17  2:18 PM  Result Value Ref Range Status   Specimen Description BLOOD RIGHT ARM  Final   Special Requests   Final    BOTTLES DRAWN AEROBIC AND ANAEROBIC Blood Culture adequate volume   Culture   Final    NO GROWTH 5 DAYS Performed at [facility], [address], [place],  [place] [address]    Report Status 07/05/2017 FINAL  Final  Culture, blood (routine x 2)     Status: None   Collection Time: 06/30/17  2:23 PM  Result Value Ref Range Status   Specimen Description BLOOD RIGHT FOREARM  Final   Special Requests   Final    BOTTLES DRAWN AEROBIC AND ANAEROBIC Blood Culture adequate volume   Culture   Final    NO GROWTH 5 DAYS Performed at [facility], [place] [address], [place], [place] [address]    Report Status 07/05/2017 FINAL  Final  MRSA PCR Screening     Status: None   Collection Time: 07/01/17 11:42 PM  Result Value Ref Range Status   MRSA by PCR NEGATIVE NEGATIVE Final    Comment:  The GeneXpert MRSA Assay (FDA approved for NASAL specimens only), is one component of a comprehensive MRSA colonization surveillance program. It is not intended to diagnose MRSA infection nor to guide or monitor treatment for MRSA infections. Performed at [facility], [place] [address], [place], [place] [address]   Culture, respiratory (NON-Expectorated)     Status: None   Collection Time: 07/02/17  4:45 AM  Result Value Ref Range Status   Specimen Description TRACHEAL ASPIRATE  Final   Special Requests NONE  Final   Gram Stain   Final    FEW WBC PRESENT, PREDOMINANTLY PMN FEW GRAM NEGATIVE RODS FEW GRAM POSITIVE COCCI IN PAIRS RARE GRAM NEGATIVE COCCOBACILLI    Culture   Final    RARE Consistent with normal respiratory flora. Performed at [facility], [place] [address], [place], [place] [address]    Report Status 07/04/2017 FINAL  Final     Scheduled Meds: . aspirin EC  81 mg Oral Daily  . atorvastatin  40 mg Oral q1800  . Chlorhexidine Gluconate Cloth  6 each Topical Daily  . enoxaparin (LOVENOX) injection  30 mg Subcutaneous Q12H  . feeding supplement (ENSURE ENLIVE)  237 mL Oral TID BM  . multivitamin with minerals  1 tablet Oral Daily  . pantoprazole  40 mg Oral Q1200  . senna-docusate  2 tablet Oral QHS     LOS: 10 days    Cherene Altes, MD [facility] Office  [phone] Pager - Text Page per Amion as per below:  On-Call/Text Page:      [name]      password [email or web address]  If 7PM-7AM, please contact night-coverage [email or web address] Password [email or web address] 07/08/2017, 11:10 AM

## 2017-07-08 NOTE — Evaluation (Signed)
Occupational Therapy Re-Evaluation Patient Details Name: Thomas [name] MRN: [id number] DOB: [birth year] Today's Date: 07/08/2017    History of Present Illness Pt is a 62 y.o. male with medical history significant of ongoing tobacco abuse otherwise unremarkable admitted 06/28/17 with sudden onset of subjective fevers, chills, diaphoresis, nonproductive cough, N&V and some EKG changes. R/o MI, but possible acute pericarditis. Pt with persistent dizziness; MRI 4/4 showed three acute nonhemorrhagic small L cerebellar infarcts. 4/7 acute AAA rupture requiring emergent repair; return to OR for BLE thrombosis with bilat iliofemoral endarterectomy.   Clinical Impression   PTA, pt was independent with ADL and functional mobility. Pt currently limited by decreased activity tolerance for ADL, abdominal incision pain, and generalized weakness. Pt also reports slight dizziness but that this is improving every day. Pt currently requiring up to min assist for ambulating toilet transfers and min guard assist for LB ADL and standing grooming tasks. He would benefit from continued OT services while admitted to improve independence and safety with ADL and functional mobility prior to returning home. Do not anticipate need for OT follow-up post-acute D/C.    Follow Up Recommendations  No OT follow up;Supervision/Assistance - 24 hour    Equipment Recommendations  3 in 1 bedside commode    Recommendations for Other Services       Precautions / Restrictions Precautions Precautions: Fall Restrictions Weight Bearing Restrictions: No      Mobility Bed Mobility               General bed mobility comments: OOB in recliner on my arrival.   Transfers Overall transfer level: Needs assistance Equipment used: None Transfers: Sit to/from Stand Sit to Stand: Min guard         General transfer comment: Min guard assist for balance.     Balance Overall balance assessment: Needs assistance Sitting-balance  support: No upper extremity supported;Feet supported Sitting balance-Leahy Scale: Good     Standing balance support: No upper extremity supported Standing balance-Leahy Scale: Fair Standing balance comment: Able to statically stand without UE support. When ambulating, using rails in hall at times.                            ADL either performed or assessed with clinical judgement   ADL Overall ADL's : Needs assistance/impaired Eating/Feeding: Set up;Sitting   Grooming: Min guard;Standing   Upper Body Bathing: Set up;Sitting   Lower Body Bathing: Sit to/from stand;Min guard   Upper Body Dressing : Set up;Sitting   Lower Body Dressing: Min guard;Sit to/from stand   Toilet Transfer: Minimal assistance;Min guard;Ambulation Toilet Transfer Details (indicate cue type and reason): Progressed to min assist with fatigue.  Toileting- Clothing Manipulation and Hygiene: Sit to/from stand;Min guard       Functional mobility during ADLs: Min guard;Minimal assistance General ADL Comments: Pt's greatest limitation this session was limited activity tolerance for ADL participation.      Vision Patient Visual Report: No change from baseline Vision Assessment?: No apparent visual deficits     Perception     Praxis      Pertinent Vitals/Pain Pain Assessment: Faces Faces Pain Scale: Hurts even more Pain Location: Abdominal incisions Pain Descriptors / Indicators: Discomfort;Headache;Sore Pain Intervention(s): Limited activity within patient's tolerance;Monitored during session;Repositioned(RN gave pain meds at end of session)     Hand Dominance Right   Extremity/Trunk Assessment Upper Extremity Assessment Upper Extremity Assessment: Overall WFL for tasks assessed   Lower Extremity Assessment  Lower Extremity Assessment: Generalized weakness(reports numbness in feet)       Communication Communication Communication: No difficulties   Cognition Arousal/Alertness:  Awake/alert Behavior During Therapy: Flat affect Overall Cognitive Status: Within Functional Limits for tasks assessed                                 General Comments: Functional cognition this session.    General Comments  Pt reporting some dizziness this session but improved as compared with PT evaluation.     Exercises     Shoulder Instructions      Home Living Family/patient expects to be discharged to:: Private residence Living Arrangements: Other relatives Available Help at Discharge: Family;Friend(s);Available PRN/intermittently Type of Home: House Home Access: Stairs to enter [employer] of Steps: 2 Entrance Stairs-Rails: None Home Layout: One level     Bathroom Shower/Tub: Tub/shower unit         Home Equipment: Kasandra Knudsen - single point          Prior Functioning/Environment Level of Independence: Independent        Comments: works in [employer] at [place] List: Decreased activity tolerance;Impaired balance (sitting and/or standing);Decreased knowledge of use of DME or AE;Decreased knowledge of precautions      OT Treatment/Interventions: Self-care/ADL training;DME and/or AE instruction;Therapeutic activities;Patient/family education;Balance training    OT Goals(Current goals can be found in the care plan section) Acute Rehab OT Goals Patient Stated Goal: Decreased pain OT Goal Formulation: With patient Potential to Achieve Goals: Good ADL Goals Pt Will Perform Lower Body Bathing: Independently;sit to/from stand Pt Will Perform Lower Body Dressing: Independently;sit to/from stand Pt Will Transfer to Toilet: Independently;ambulating;regular height toilet Pt Will Perform Toileting - Clothing Manipulation and hygiene: Independently;sit to/from stand Pt/caregiver will Perform Home Exercise Program: Increased strength;Both right and left upper extremity;Independently  OT Frequency: Min 2X/week   Barriers to D/C:             Co-evaluation              AM-PAC PT "6 Clicks" Daily Activity     Outcome Measure Help from another person eating meals?: None Help from another person taking care of personal grooming?: A Little Help from another person toileting, which includes using toliet, bedpan, or urinal?: A Little Help from another person bathing (including washing, rinsing, drying)?: A Little Help from another person to put on and taking off regular upper body clothing?: None Help from another person to put on and taking off regular lower body clothing?: A Little 6 Click Score: 20   End of Session Equipment Utilized During Treatment: Gait belt Nurse Communication: Mobility status(telemetry monitor needs new battery)  Activity Tolerance: Patient tolerated treatment well Patient left: in chair;with call bell/phone within reach  OT Visit Diagnosis: Unsteadiness on feet (R26.81)                Time: 1040-1059 OT Time Calculation (min): 19 min Charges:  OT General Charges $OT Visit: 1 Visit OT Evaluation $OT Re-eval: 1 Re-eval G-Codes:     Norman Herrlich, MS OTR/L  Pager: [place] A Kathey Simer 07/08/2017, 11:52 AM

## 2017-07-09 LAB — CBC WITH DIFFERENTIAL/PLATELET
BASOS ABS: 0 10*3/uL (ref 0.0–0.1)
Basophils Relative: 0 %
EOS PCT: 5 %
Eosinophils Absolute: 0.7 10*3/uL (ref 0.0–0.7)
HCT: 30.1 % — ABNORMAL LOW (ref 39.0–52.0)
HEMOGLOBIN: 10 g/dL — AB (ref 13.0–17.0)
LYMPHS PCT: 29 %
Lymphs Abs: 4.1 10*3/uL — ABNORMAL HIGH (ref 0.7–4.0)
MCH: 26 pg (ref 26.0–34.0)
MCHC: 33.2 g/dL (ref 30.0–36.0)
MCV: 78.4 fL (ref 78.0–100.0)
MONOS PCT: 8 %
Monocytes Absolute: 1.1 10*3/uL — ABNORMAL HIGH (ref 0.1–1.0)
Neutro Abs: 8.1 10*3/uL — ABNORMAL HIGH (ref 1.7–7.7)
Neutrophils Relative %: 58 %
Platelets: 428 10*3/uL — ABNORMAL HIGH (ref 150–400)
RBC: 3.84 MIL/uL — AB (ref 4.22–5.81)
RDW: 15.7 % — ABNORMAL HIGH (ref 11.5–15.5)
WBC: 14 10*3/uL — AB (ref 4.0–10.5)

## 2017-07-09 LAB — BASIC METABOLIC PANEL
ANION GAP: 6 (ref 5–15)
BUN: 13 mg/dL (ref 6–20)
CO2: 24 mmol/L (ref 22–32)
Calcium: 7.9 mg/dL — ABNORMAL LOW (ref 8.9–10.3)
Chloride: 104 mmol/L (ref 101–111)
Creatinine, Ser: 1.27 mg/dL — ABNORMAL HIGH (ref 0.61–1.24)
GFR calc Af Amer: >60 mL/min (ref >60)
GFR, EST NON AFRICAN AMERICAN: 58 mL/min — AB (ref >60)
Glucose, Bld: 97 mg/dL (ref 65–99)
POTASSIUM: 3.8 mmol/L (ref 3.5–5.1)
SODIUM: 134 mmol/L — AB (ref 135–145)

## 2017-07-09 LAB — URINE CULTURE: Culture: NO GROWTH

## 2017-07-09 LAB — PATHOLOGIST SMEAR REVIEW

## 2017-07-09 MED ORDER — ACETAMINOPHEN 160 MG/5ML PO SOLN
650.0000 mg | Freq: Four times a day (QID) | ORAL | Status: DC | PRN
  Administered 2017-07-11: 650 mg via ORAL
  Filled 2017-07-09: qty 20.3

## 2017-07-09 MED ORDER — BOOST / RESOURCE BREEZE PO LIQD CUSTOM
1.0000 | Freq: Three times a day (TID) | ORAL | Status: DC
  Administered 2017-07-09 – 2017-07-10 (×3): 1 via ORAL
  Administered 2017-07-11: 17:00:00 via ORAL
  Administered 2017-07-11 – 2017-07-14 (×7): 1 via ORAL

## 2017-07-09 MED ORDER — SODIUM CHLORIDE 0.9 % IV BOLUS
250.0000 mL | Freq: Once | INTRAVENOUS | Status: AC
  Administered 2017-07-09: 250 mL via INTRAVENOUS

## 2017-07-09 NOTE — Progress Notes (Signed)
Nutrition Follow-up  DOCUMENTATION CODES:   Not applicable  INTERVENTION:   -D/c Ensure Enlive po TID, each supplement provides 350 kcal and 20 grams of protein, due to poor acceptance -Continue MVI daily -Boost Breeze po TID, each supplement provides 250 kcal and 9 grams of protein  NUTRITION DIAGNOSIS:   Inadequate oral intake related to decreased appetite as evidenced by per patient/family report, meal completion < 25%.  Ongoing  GOAL:   Patient will meet greater than or equal to 90% of their needs  Unmet  MONITOR:   PO intake, Labs, Weight trends, I & O's, Supplement acceptance  REASON FOR ASSESSMENT:   Consult Assessment of nutrition requirement/status, Poor PO, Diet education  ASSESSMENT:   Thomas [name] is a 62 y.o. male with medical history significant of ongoing tobacco abuse otherwise unremarkable presented to the ED with sudden onset of subjective fevers, chills, diaphoresis, nonproductive cough that started at 4 AM on the day of admission with 3 episodes of emesis and some nausea.  Spoke with pt, who did not engage much with this RD at time of visit. His intake continues to be poor; noted meal completion 0-50%. Pt has been consuming some food at home. Observed lunch tray, which was untouched. Pt complained of severe abdominal pain, which is why poor oral intake is so low. Pt has not been consuming Ensure supplements. Encouraged pt to eat. Will trial Boost Breeze supplements.   Labs reviewed: Na: 134 (on IV supplementation).   Diet Order:  Diet regular Room service appropriate? Yes; Fluid consistency: Thin  EDUCATION NEEDS:   Education needs have been addressed  Skin:  Skin Assessment: Skin Integrity Issues: Skin Integrity Issues:: Incisions Incisions: rt groin and lt groin  Last BM:  07/07/17  Height:   Ht Readings from Last 1 Encounters:  07/02/17 5\' 5"  (1.651 m)    Weight:   Wt Readings from Last 1 Encounters:  07/09/17 129 lb 12.8 oz (58.9 kg)     Ideal Body Weight:  59.1 kg  BMI:  Body mass index is 21.6 kg/m.  Estimated Nutritional Needs:   Kcal:  1700-1900 kcal  Protein:  80-95 grams  Fluid:  > 1.7 L    Kenta Laster A. Jimmye Norman, RD, LDN, CDE Pager: [phone] After hours Pager: [phone]

## 2017-07-09 NOTE — Progress Notes (Signed)
  Progress Note    07/09/2017 10:20 AM 8 Days Post-Op  Subjective:  LLQ pain, no worse today   Vitals:   07/09/17 0400 07/09/17 0734  BP: 117/69 125/79  Pulse: 75 71  Resp: 18 14  Temp: 98.5 F (36.9 C) 98.7 F (37.1 C)  SpO2: 94% 96%   Physical Exam: Lungs:  Non labored Incisions:  B groin incisions intact; possible seroma proximal aspect L groin incision Extremities:  Palpable L DP > R DP Abdomen:  Soft; tenderness to deep palpation LLQ Neurologic: A&O  CBC    Component Value Date/Time   WBC 14.0 (H) 07/09/2017 0420   RBC 3.84 (L) 07/09/2017 0420   HGB 10.0 (L) 07/09/2017 0420   HCT 30.1 (L) 07/09/2017 0420   PLT 428 (H) 07/09/2017 0420   MCV 78.4 07/09/2017 0420   MCV 77.8 (A) 06/28/2017 1335   MCH 26.0 07/09/2017 0420   MCHC 33.2 07/09/2017 0420   RDW 15.7 (H) 07/09/2017 0420   LYMPHSABS 4.1 (H) 07/09/2017 0420   MONOABS 1.1 (H) 07/09/2017 0420   EOSABS 0.7 07/09/2017 0420   BASOSABS 0.0 07/09/2017 0420    BMET    Component Value Date/Time   NA 134 (L) 07/09/2017 0420   K 3.8 07/09/2017 0420   CL 104 07/09/2017 0420   CO2 24 07/09/2017 0420   GLUCOSE 97 07/09/2017 0420   BUN 13 07/09/2017 0420   CREATININE 1.27 (H) 07/09/2017 0420   CALCIUM 7.9 (L) 07/09/2017 0420   GFRNONAA 58 (L) 07/09/2017 0420   GFRAA >60 07/09/2017 0420    INR    Component Value Date/Time   INR 1.04 07/01/2017 1632     Intake/Output Summary (Last 24 hours) at 07/09/2017 1020 Last data filed at 07/09/2017 0826 Gross per 24 hour  Intake 3227.5 ml  Output 1970 ml  Net 1257.5 ml     Assessment/Plan:  62 y.o. male is s/p endovascular repair of abdominal aortic aneurysm bilateral iliofemoral endarterectomies  8 Days Post-Op   Perfusing BLE well with palpable pedal pulses L > RLE IV antibiotics via PICC Encouraged OOB and participation with therapy teams No indication for intervention on R graft limb at this time Medical management per primary team   Dagoberto Ligas, PA-C [facility] [phone] 07/09/2017 10:20 AM

## 2017-07-09 NOTE — Care Management Note (Signed)
Case Management  Original Note Created by  Marvetta Gibbons RN, BSN Unit 4E-Case Manager- [place] coverage [phone]  Patient Details  Name: Thomas [name] MRN: [id number] Date of Birth: 01-Jan-1956  Subjective/Objective:  Pt admitted with weakness, chills, dizziness, cough, left-sided chest pain, abdominal pain, nausea and vomiting to [facility]- tx to [facility] for acute stroke- MRI positive for acute left cerebellar infarcts possibly septic emboli vs. hypotension in the setting of vascular stenosis.  S/p emergent endovascular repair of ruptured AAA, bilateral iliofemoral endarterectomy, BLE thromboembolectomies on 07/01/17- remains on vent post op with plan to extubate 07/02/17                Action/Plan: PTA  Pt lived at home was independent with ADLs and worked as a Presenter, broadcasting. Pt will have family/friends to assist when ready to transition home.  Per MD note most likely will need extended coarse of abx ?iv for discharge- CM to follow for transition of care needs.   Expected Discharge Date:                 Expected Discharge Plan:  OP Rehab  In-House Referral:     Discharge planning Services  CM Consult  Post Acute Care Choice:    Choice offered to:     DME Arranged:    DME Agency:     HH Arranged:  RN, PT [place] Agency:  [place]  Status of Service:  In process, will continue to follow  If discussed at Long Length of Stay Meetings, dates discussed:    Discharge Disposition:   Additional Comments: 07/09/2017  [facility] successfully arranged for to come to Short Stay for RN interventions associated with PICC line and labs.  PT changed recommendation to include the need for 24 hour supervision.  CM informed attending of new recommendation of 24 hour supervision which pt confirms he does not have .  Pt informed of recommendation and informed CM that he is in agreement for SNF if required due to lack of supervision.  CM informed CSW of potential placement need.  CM attempted to contact therapy  multiple times to discuss recommendation.  07/06/17 Update 1640:  CM informed that by [facility] that pt actually does not have IV infusion nor HH benefits with current insurance.  [place] working with pt/family to set pt up on payment plan for infusion, train family on IV antibiotic administration and arrange Short Stay to see pt once a week for labs and dressing changes (per [facility] pt will not require HHRN for infusion with these arrangements).  Pt will need to go to outpt PT for therapy as HH is not covered.  CM texted paged attending  Update: CM met with pt and daughter at bedside - pt is independent from home alone.  Daughter has agreed to come to pts house daily and help with IV antibiotics and dressing changes.  [facility] to meet with pt and daughter today at 3pm.  Based on the pt needs for [facility] he will need to have HHPT instead of outpt PT - Attending informed and orders requested.  [facility] aware of HHRN and PT need - referral accepted  Pt now on 2c.  Per I&D; pt will need 6 weeks IV antibiotic Rocephin - per [facility] note they are already following .  Maryclare Labrador, RN 07/09/2017, 4:19 PM

## 2017-07-09 NOTE — Progress Notes (Signed)
Patient refused to ambulate in hall, says he is "too tired and has already walked today". RN stressed importance of walk as OT recommending 24h supervision.

## 2017-07-09 NOTE — Progress Notes (Signed)
[place] TEAM 1 - Stepdown/ICU TEAM  Charlyne Mom  RSW:[id number] DOB: 1955-05-02 DOA: 06/28/2017 PCP: Patient, No Pcp Per    Brief Narrative:  62yo M w/ a hx of Tobacco Abuse, HTN, and CKD Stage 3 who presented to the ED on 4/4 with fever, chills, nonproductive cough, and chest pain. WBC 24.1. CXR with interstitial lung disease with pleural scarring. EKG with ST elevation in the anterior leads. Cardiology was consulted and felt this was due to acute pericarditis. Started on ASA. Placed on azithromycin and rocephin for probable clinical pneumonia. Admitted to [facility].  During this admission an MRI Brain obtained d/u to c/o dizziness noted 3 acute areas of CVA in the left cerebellum as well as evidence of old small bilateral cerebellar infarcts and old basal ganglia/thalamic infarcts.  Blood cultures returned positive for Pnemococcal bacteremia, raising a concern for bacterial endocarditis.   On 4/7 patient the pt developed diffuse abdominal pain with hypotension in the 70s. CT A/P noted a 6.6 cm diameter aortic aneurysm with findings concerning for rupture. He was taken to OR for endovascular repair of ruptured AAA.  In the PACU he was noted to have absent fem pulses and was taken back to OR for bilateral iliofemoral endarterectomy, BLE thromboembolectomies  Significant Events: 4/4 admit   4/6 MRI noted 3 small acute CVAs 4/7 acute AAA rupture - emergent repair in OR - return to OR w/ B LE thrombosis  4/11 [facility] assumed care 4/11 PICC placed   Subjective: Tmax 100.3 over last 24hrs.  WBC trending down.  CT abdom w/o evidence of abscess formation.  The patient tells me he continues to have lower and left quadrant abdominal pain but this is not worsened.  Overall he feels about the same today.  He denies chest pain shortness of breath nausea or vomiting.  Assessment & Plan:  Low grade temp elevation / FUO - worsening leukocytosis  Worrisome for occult nidus of infection - repeat CT abdom without  evidence of abscess formation - follow fever curve - repeat blood cx pending at this time - UA unremarkable - monitor another 24hrs before considering D/C home    Ruptured Aortic Aneurysm s/p stent placement - large RPH  S/p emergent endovascular repair 4/7 w/ B iliofemoral endarterectomy and subsequent B LE thromboembolectomies - care per Vascular Surgery, who feel he is progressing nicely    S/P Bilateral LE Thrombectomies   CTA 4/9 shows thrombus in the right limb of the graft - Vascular directing care of this issue    Aneurysm of the ascending thoracic aorta measuring at least 4.4cm Noted on CT chest - will need serial f/u   Strep pneumo bacteremia - concern for Bacterial Endocarditis - Pericarditis  No vegetation on TTE - abx per ID - decision has been made to forgo TEE - f/u blood cx on 06/30/17 negative x5 days - repeat blood cx from 4/14 pending   LLL PNA - presumed Strep pneumo On abx coverage - f/u CXR suggests improvement - no persisting clinical sx   3 acute LEFT cerebellar infarcts  Pt reported recent dizziness at admit - septic emboli v/s hypotension - Neurology has evaluated - MRI also noted old small bilateral cerebellar infarcts + old basal ganglia and thalamus infarcts - Neuro suggests ASA 81mg  daily  RLE weakness and numbness - PVD more consistent with PVD per Neurology - essentially resolved at this time - walking w/ PT/OT   Acute hypoxic respiratory failure  in setting hemorrhagic shock r/t  ruptured AAA  extubated 4/8 - has been weaned to RA   ILD per CT will need outpatient follow up for ILD  Hemorrhagic Shock / Acute blood loss anemia in setting of Ruptured AAA Hgb stable - following trend   Recent Labs  Lab 07/06/17 0330 07/07/17 0438 07/08/17 0420 07/09/17 0420  HGB 10.7* 11.5* 10.8* 10.0*    Intracranial vascular occlusion / stenosis MRA noted right M1 occlusion, likely chronic, bilateral PCA stenosis, and terminal right vertebral artery stenosis -  avoid hypotension   HTN Goal SBP 110-140 per Neuro recs - wish to avoid extremes of hypotension or hypertension - BP stable presently    Indeterminate L adrenal nodule  Noted on CTa chest/abd/pelvis 4/9 - will need f/u imaging as outpt  HLD  On Lipitor     Chronic Kidney Disease Stage 3  crt slightly increased - follow trend   Recent Labs  Lab 07/04/17 1554 07/05/17 0515 07/06/17 0330 07/07/17 0438 07/09/17 0420  CREATININE 1.24 1.21 1.15 1.17 1.27*    DVT prophylaxis: SCDs + lovenox  Code Status: FULL CODE Family Communication: no family present at time of exam  Disposition Plan: PT/OT mobilization - infection w/u underway    Consultants:  Vasc Surgery  PCCM ID  Neurology   Antimicrobials:  Azithromycin 4/4  Rocephin 4/4 >  Ancef 4/7   Objective: Blood pressure 125/79, pulse 71, temperature 98.7 F (37.1 C), temperature source Oral, resp. rate 14, height 5\' 5"  (1.651 m), weight 57.1 kg (125 lb 12.8 oz), SpO2 96 %.  Intake/Output Summary (Last 24 hours) at 07/09/2017 0907 Last data filed at 07/09/2017 0826 Gross per 24 hour  Intake 3227.5 ml  Output 2320 ml  Net 907.5 ml   Filed Weights   07/06/17 0518 07/07/17 0313 07/08/17 0800  Weight: 64.7 kg (142 lb 10.2 oz) 63.7 kg (140 lb 6.9 oz) 57.1 kg (125 lb 12.8 oz)    Examination: General: No acute respiratory distress - A&O Lungs: CTA th/o  Cardiovascular: RRR w/o M  Abdomen: modestly tender to palpation in L lower quad/flank w/o change, no rebound, BS+, not distended Extremities: no edema B LE - B feet warm to touch still  CBC: Recent Labs  Lab 07/03/17 1854  07/07/17 0438 07/08/17 0420 07/09/17 0420  WBC 15.7*   < > 20.0* 20.1* 14.0*  NEUTROABS 12.5*  --   --   --  8.1*  HGB 10.3*   < > 11.5* 10.8* 10.0*  HCT 35.5*   < > 33.9* 31.6* 30.1*  MCV 88.5   < > 76.2* 76.1* 78.4  PLT 204   < > 339 371 428*   < > = values in this interval not displayed.   Basic Metabolic Panel: Recent Labs  Lab  07/03/17 0300  07/06/17 0330 07/07/17 0438 07/09/17 0420  NA 136   < > 137 133* 134*  K 3.4*   < > 3.2* 3.7 3.8  CL 100*   < > 105 102 104  CO2 28   < > 24 22 24   GLUCOSE 144*   < > 108* 96 97  BUN 30*   < > 17 16 13   CREATININE 1.32*   < > 1.15 1.17 1.27*  CALCIUM 6.8*   < > 7.7* 7.6* 7.9*  MG 2.2  --  2.2  --   --   PHOS 1.6*  --  2.9  --   --    < > = values in this interval not  displayed.   GFR: Estimated Creatinine Clearance: 48.1 mL/min (A) (by C-G formula based on SCr of 1.27 mg/dL (H)).  Liver Function Tests: Recent Labs  Lab 07/04/17 0359 07/06/17 0330  AST 151* 81*  ALT 50 60  ALKPHOS 92 102  BILITOT 0.6 1.0  PROT 5.7* 6.0*  ALBUMIN 2.1* 2.1*    Cardiac Enzymes: Recent Labs  Lab 07/04/17 2041 07/05/17 0515 07/05/17 1418 07/06/17 0330 07/07/17 0438  CKTOTAL 3,399* 2,734* 2,091* 1,207* 1,014*    HbA1C: Hgb A1c MFr Bld  Date/Time Value Ref Range Status  06/29/2017 03:20 AM 5.6 4.8 - 5.6 % Final    Comment:    (NOTE) Pre diabetes:          5.7%-6.4% Diabetes:              >6.4% Glycemic control for   <7.0% adults with diabetes     CBG: Recent Labs  Lab 07/03/17 2333 07/04/17 0543 07/04/17 1207 07/04/17 1845 07/04/17 2314  GLUCAP 161* 167* 158* 100* 132*    Recent Results (from the past 240 hour(s))  Culture, blood (routine x 2)     Status: None   Collection Time: 06/30/17  2:18 PM  Result Value Ref Range Status   Specimen Description BLOOD RIGHT ARM  Final   Special Requests   Final    BOTTLES DRAWN AEROBIC AND ANAEROBIC Blood Culture adequate volume   Culture   Final    NO GROWTH 5 DAYS Performed at [facility], [place] [address] [place], [place] [address]    Report Status 07/05/2017 FINAL  Final  Culture, blood (routine x 2)     Status: None   Collection Time: 06/30/17  2:23 PM  Result Value Ref Range Status   Specimen Description BLOOD RIGHT FOREARM  Final   Special Requests   Final    BOTTLES DRAWN AEROBIC AND  ANAEROBIC Blood Culture adequate volume   Culture   Final    NO GROWTH 5 DAYS Performed at [facility], [place] [address], [place], [place] [address]    Report Status 07/05/2017 FINAL  Final  MRSA PCR Screening     Status: None   Collection Time: 07/01/17 11:42 PM  Result Value Ref Range Status   MRSA by PCR NEGATIVE NEGATIVE Final    Comment:        The GeneXpert MRSA Assay (FDA approved for NASAL specimens only), is one component of a comprehensive MRSA colonization surveillance program. It is not intended to diagnose MRSA infection nor to guide or monitor treatment for MRSA infections. Performed at [facility], [place] [address], [place], [place] [address]   Culture, respiratory (NON-Expectorated)     Status: None   Collection Time: 07/02/17  4:45 AM  Result Value Ref Range Status   Specimen Description TRACHEAL ASPIRATE  Final   Special Requests NONE  Final   Gram Stain   Final    FEW WBC PRESENT, PREDOMINANTLY PMN FEW GRAM NEGATIVE RODS FEW GRAM POSITIVE COCCI IN PAIRS RARE GRAM NEGATIVE COCCOBACILLI    Culture   Final    RARE Consistent with normal respiratory flora. Performed at [facility], [place] [address], [place], [place] [address]    Report Status 07/04/2017 FINAL  Final     Scheduled Meds: . aspirin EC  81 mg Oral Daily  . atorvastatin  40 mg Oral q1800  . enoxaparin (LOVENOX) injection  30 mg Subcutaneous Q12H  . feeding supplement (  ENSURE ENLIVE)  237 mL Oral TID BM  . multivitamin with minerals  1 tablet Oral Daily  . pantoprazole  40 mg Oral Q1200  . senna-docusate  2 tablet Oral QHS     LOS: 11 days   Cherene Altes, MD [facility] Office  [phone] Pager - Text Page per Amion as per below:  On-Call/Text Page:      [name]      password [email or web address]  If 7PM-7AM, please contact night-coverage [email or web address] Password [email or web address] 07/09/2017, 9:07 AM

## 2017-07-09 NOTE — Progress Notes (Signed)
Physical Therapy Treatment Patient Details Name: Thomas [name] MRN: [id number] DOB: 08/25/1955 Today's Date: 07/09/2017    History of Present Illness Pt is a 62 y.o. male with medical history significant of ongoing tobacco abuse otherwise unremarkable admitted 06/28/17 with sudden onset of subjective fevers, chills, diaphoresis, nonproductive cough, N&V and some EKG changes. R/o MI, but possible acute pericarditis. Pt with persistent dizziness; MRI 4/4 showed three acute nonhemorrhagic small L cerebellar infarcts. 4/7 acute AAA rupture requiring emergent repair; return to OR for BLE thrombosis with bilat iliofemoral endarterectomy.    PT Comments    Pt mobility improved from re-eval however remains limited by abdominal pain. Pt with improved stability but remains at increased falls risk. Pt con't to demo R sided weakness as well. Pt would need 24/7 assist for safe d/c home.   Follow Up Recommendations  Home health PT;Supervision for mobility/OOB     Equipment Recommendations       Recommendations for Other Services OT consult     Precautions / Restrictions Precautions Precautions: Fall Precaution Comments: abdominal pain Restrictions Weight Bearing Restrictions: No    Mobility  Bed Mobility Overal bed mobility: Modified Independent             General bed mobility comments: pt able to get in/out of bed using bed rails and hob elevated but no physical assist  Transfers Overall transfer level: Needs assistance Equipment used: None Transfers: Sit to/from Stand Sit to Stand: Min guard         General transfer comment: Min guard assist for balance.   Ambulation/Gait Ambulation/Gait assistance: Min guard;Min assist Ambulation Distance (Feet): 175 Feet Assistive device: None Gait Pattern/deviations: Step-through pattern;Decreased stride length Gait velocity: decresaed Gait velocity interpretation: <1.31 ft/sec, indicative of household ambulator General Gait Details: slow  unsteady gait pattern. pt with decreased R dorsiflexion with staggering L/R but no overt episode of LOB.   Stairs             Wheelchair Mobility    Modified Rankin (Stroke Patients Only)       Balance Overall balance assessment: Needs assistance Sitting-balance support: No upper extremity supported;Feet supported Sitting balance-Leahy Scale: Fair Sitting balance - Comments: able to don/doff socks but did have a LOB to the L.   Standing balance support: No upper extremity supported Standing balance-Leahy Scale: Fair Standing balance comment: pt able to urinate in standing with min guard assist                            Cognition Arousal/Alertness: Awake/alert Behavior During Therapy: Flat affect Overall Cognitive Status: Within Functional Limits for tasks assessed                                        Exercises      General Comments        Pertinent Vitals/Pain Pain Assessment: 0-10 Pain Score: 8  Pain Location: abdominal pain Pain Descriptors / Indicators: Grimacing;Guarding Pain Intervention(s): Monitored during session    Home Living                      Prior Function            PT Goals (current goals can now be found in the care plan section) Acute Rehab PT Goals Patient Stated Goal: stop the pain Progress towards PT goals:  Progressing toward goals    Frequency    Min 3X/week      PT Plan Current plan remains appropriate    Co-evaluation              AM-PAC PT "6 Clicks" Daily Activity  Outcome Measure  Difficulty turning over in bed (including adjusting bedclothes, sheets and blankets)?: None Difficulty moving from lying on back to sitting on the side of the bed? : None Difficulty sitting down on and standing up from a chair with arms (e.g., wheelchair, bedside commode, etc,.)?: A Little Help needed moving to and from a bed to chair (including a wheelchair)?: A Little Help needed walking  in hospital room?: A Little Help needed climbing 3-5 steps with a railing? : A Little 6 Click Score: 20    End of Session Equipment Utilized During Treatment: Gait belt Activity Tolerance: Patient limited by pain(abdominal) Patient left: in bed;with call bell/phone within reach;with nursing/sitter in room Nurse Communication: Mobility status(abdominal pain) PT Visit Diagnosis: Other abnormalities of gait and mobility (R26.89)     Time: 5997-7414 PT Time Calculation (min) (ACUTE ONLY): 14 min  Charges:  $Gait Training: 8-22 mins                    G Codes:       Kittie Plater, PT, DPT Pager #: [phone] Office #: [phone]    [place] 07/09/2017, 1:20 PM

## 2017-07-10 LAB — CBC
HCT: 29.5 % — ABNORMAL LOW (ref 39.0–52.0)
Hemoglobin: 9.8 g/dL — ABNORMAL LOW (ref 13.0–17.0)
MCH: 25.5 pg — ABNORMAL LOW (ref 26.0–34.0)
MCHC: 33.2 g/dL (ref 30.0–36.0)
MCV: 76.8 fL — ABNORMAL LOW (ref 78.0–100.0)
Platelets: 472 K/uL — ABNORMAL HIGH (ref 150–400)
RBC: 3.84 MIL/uL — ABNORMAL LOW (ref 4.22–5.81)
RDW: 15.3 % (ref 11.5–15.5)
WBC: 13.7 K/uL — ABNORMAL HIGH (ref 4.0–10.5)

## 2017-07-10 LAB — BASIC METABOLIC PANEL WITH GFR
Anion gap: 7 (ref 5–15)
BUN: 11 mg/dL (ref 6–20)
CO2: 23 mmol/L (ref 22–32)
Calcium: 7.8 mg/dL — ABNORMAL LOW (ref 8.9–10.3)
Chloride: 102 mmol/L (ref 101–111)
Creatinine, Ser: 1.22 mg/dL (ref 0.61–1.24)
GFR calc Af Amer: >60 mL/min (ref >60)
GFR calc non Af Amer: >60 mL/min (ref >60)
Glucose, Bld: 103 mg/dL — ABNORMAL HIGH (ref 65–99)
Potassium: 3.6 mmol/L (ref 3.5–5.1)
Sodium: 132 mmol/L — ABNORMAL LOW (ref 135–145)

## 2017-07-10 NOTE — Progress Notes (Signed)
Physical Therapy Treatment Patient Details Name: Thomas [name] MRN: [id number] DOB: [birth year] Today's Date: 07/10/2017    History of Present Illness Pt is a 62 y.o. male with medical history significant of ongoing tobacco abuse otherwise unremarkable admitted 06/28/17 with sudden onset of subjective fevers, chills, diaphoresis, nonproductive cough, N&V and some EKG changes. R/o MI, but possible acute pericarditis. Pt with persistent dizziness; MRI 4/4 showed three acute nonhemorrhagic small L cerebellar infarcts. 4/7 acute AAA rupture requiring emergent repair; return to OR for BLE thrombosis with bilat iliofemoral endarterectomy.    PT Comments    Pt remains unsteady on feet during ambulation and at increased falls risk even with RW. Pt self limiting due to pain and has no interest in eating. Pt with constant abdominal and L groin pain at incision even with pain medicine. Pt unable to return home alone at this time as pt is unable to function indep or perform ADLs safely on his own. Pt without 24/7 assist and questionable if family would be dependable form of transportation or administration of IV antibiotics.Recommend ST-SNF to achieve safe mod I level of function. Acute PT to con't to follow.   Follow Up Recommendations  SNF;Supervision/Assistance - 24 hour     Equipment Recommendations  Rolling walker with 5" wheels    Recommendations for Other Services       Precautions / Restrictions Precautions Precautions: Fall Precaution Comments: abdominal pain Restrictions Weight Bearing Restrictions: No    Mobility  Bed Mobility Overal bed mobility: Modified Independent             General bed mobility comments: pt able to get in/out of bed using bed rails and hob elevated but no physical assist  Transfers Overall transfer level: Needs assistance Equipment used: None Transfers: Sit to/from Stand Sit to Stand: Min guard         General transfer comment: pt unsteady, min guard  for balance. Pt reaching with hands to brace self and hold onto things  Ambulation/Gait Ambulation/Gait assistance: Min guard;Min assist Ambulation Distance (Feet): 200 Feet(x2) Assistive device: Rolling walker (2 wheeled);None Gait Pattern/deviations: Step-through pattern;Decreased stride length;Staggering left;Staggering right Gait velocity: dec Gait velocity interpretation: <1.8 ft/sec, indicate of risk for recurrent falls General Gait Details: pt amb 200' without AD. pt very unsteady with lateral drifting L/R requiring minA to maintain balance. Pt reaching for hallway rail and reports "i cant walk good"  pt given RW. Pt more stable with RW however minA required for safe walker management, especially around obstacles and in bathroom   Stairs             Wheelchair Mobility    Modified Rankin (Stroke Patients Only) Modified Rankin (Stroke Patients Only) Pre-Morbid Rankin Score: No symptoms Modified Rankin: Moderate disability     Balance Overall balance assessment: Needs assistance Sitting-balance support: No upper extremity supported;Feet supported Sitting balance-Leahy Scale: Fair Sitting balance - Comments: able to don/doff socks but did have a LOB to the L.   Standing balance support: No upper extremity supported Standing balance-Leahy Scale: Fair Standing balance comment: pt able to urinate in standing with min guard assist                            Cognition Arousal/Alertness: Awake/alert Behavior During Therapy: Flat affect Overall Cognitive Status: Impaired/Different from baseline Area of Impairment: Safety/judgement;Problem solving  Safety/Judgement: Decreased awareness of safety   Problem Solving: Requires verbal cues;Requires tactile cues General Comments: pt self limiting. when asked how pt would get to grocery store or make food pt states "i'm not worried about food. i can't eat, i'm not hungry." Pt with no  initiative of mobility. pt reports "i just need to lay down"      Exercises      General Comments        Pertinent Vitals/Pain Pain Assessment: 0-10 Pain Score: 8  Pain Location: L groin - incision, abdominal pain and bilat ankles Pain Descriptors / Indicators: Grimacing;Guarding Pain Intervention(s): Premedicated before session    Home Living                      Prior Function            PT Goals (current goals can now be found in the care plan section) Acute Rehab PT Goals Patient Stated Goal: stop the pain Progress towards PT goals: Not progressing toward goals - comment(due to pain)    Frequency    Min 3X/week      PT Plan Discharge plan needs to be updated    Co-evaluation              AM-PAC PT "6 Clicks" Daily Activity  Outcome Measure  Difficulty turning over in bed (including adjusting bedclothes, sheets and blankets)?: None Difficulty moving from lying on back to sitting on the side of the bed? : None Difficulty sitting down on and standing up from a chair with arms (e.g., wheelchair, bedside commode, etc,.)?: A Little Help needed moving to and from a bed to chair (including a wheelchair)?: A Little Help needed walking in hospital room?: A Little Help needed climbing 3-5 steps with a railing? : A Little 6 Click Score: 20    End of Session Equipment Utilized During Treatment: Gait belt Activity Tolerance: Patient limited by pain Patient left: in bed;with call bell/phone within reach;with nursing/sitter in room Nurse Communication: Mobility status PT Visit Diagnosis: Other abnormalities of gait and mobility (R26.89)     Time: 7510-2585 PT Time Calculation (min) (ACUTE ONLY): 27 min  Charges:  $Gait Training: 23-37 mins                    G Codes:       Kittie Plater, PT, DPT Pager #: [phone] Office #: [phone]    [place] 07/10/2017, 9:53 AM

## 2017-07-10 NOTE — Clinical Social Work Note (Signed)
Clinical Social Work Assessment  Patient Details  Name: Thomas [name] MRN: [id number] Date of Birth: April 11, 1955  Date of referral:  07/10/17               Reason for consult:  Facility Placement, Discharge Planning                Permission sought to share information with:  Facility Art therapist granted to share information::     Name::        Agency::  SNF's  Relationship::     Contact Information:     Housing/Transportation Living arrangements for the past 2 months:  Single Family Home Source of Information:  Patient, Medical Team Patient Interpreter Needed:  None Criminal Activity/Legal Involvement Pertinent to Current Situation/Hospitalization:  No - Comment as needed Significant Relationships:  Adult Children, Other Family Members Lives with:  Relatives, Friends Do you feel safe going back to the place where you live?  Yes Need for family participation in patient care:  Yes (Comment)  Care giving concerns:  PT has changed recommendation from [place] to SNF.   Social Worker assessment / plan:  CSW met with patient. No supports at bedside. CSW introduced role and explained that PT recommendations would be discussed. Patient wants to discuss SNF with his cousin after he gets off work at 5:00 today before making decision on if he will go to SNF or not. He gave CSW permission to go ahead and send out referral. CSW explained that only a limited amount of facilities in [facility] take [place] and we cannot guarantee insurance would approve with patient walking 200 feet x 2 today. Patient expressed understanding. CSW provided SNF list and CSW contact card for cousin to call. No further concerns. CSW encouraged patient to contact CSW as needed. CSW will continue to follow patient for support and facilitate discharge to SNF, if agreeable and approved, once medically stable.  Employment status:  [employer] information:  Other (Comment Required)([employer]) PT  Recommendations:  [place] / Referral to community resources:  [place]  Patient/Family's Response to care:  Patient wants to discuss SNF with his cousin before making decision. Patient's family supportive and involved in patient's care. Patient appreciated social work intervention.  Patient/Family's Understanding of and Emotional Response to Diagnosis, Current Treatment, and Prognosis:  Patient has a good understanding of the reason for admission and PT recommendations. Patient appears pleased with hospital care.  Emotional Assessment Appearance:  Appears stated age Attitude/Demeanor/Rapport:  Engaged Affect (typically observed):  Appropriate, Calm, Pleasant Orientation:  Oriented to Self, Oriented to Place, Oriented to  Time, Oriented to Situation Alcohol / Substance use:  Never Used Psych involvement (Current and /or in the community):  No (Comment)  Discharge Needs  Concerns to be addressed:  Care Coordination Readmission within the last 30 days:  No Current discharge risk:  Dependent with Mobility Barriers to Discharge:  Continued Medical Work up, [place], LCSW 07/10/2017, 2:32 PM

## 2017-07-10 NOTE — Progress Notes (Signed)
Occupational Therapy Treatment Patient Details Name: Thomas [name] MRN: [id number] DOB: 05-Jan-1956 Today's Date: 07/10/2017    History of present illness Pt is a 62 y.o. male with medical history significant of ongoing tobacco abuse otherwise unremarkable admitted 06/28/17 with sudden onset of subjective fevers, chills, diaphoresis, nonproductive cough, N&V and some EKG changes. R/o MI, but possible acute pericarditis. Pt with persistent dizziness; MRI 4/4 showed three acute nonhemorrhagic small L cerebellar infarcts. 4/7 acute AAA rupture requiring emergent repair; return to OR for BLE thrombosis with bilat iliofemoral endarterectomy.   OT comments  Pt with LOB during mobility for ADL but able to self correct. Complaining of lower left abdominal pain - premedicated prio rot session.  "Mild dizziness" per pt. Feel pt would benefit from rehab at SNF prior to DC home. Pt states his cousin and nieces are able to help in the evening. If pt does not go to SNF, recommend follow up with [facility]. Will continue to follow acutely to facilitate DC to next venue of care.   Follow Up Recommendations  SNF;Home health OT;Supervision/Assistance - 24 hour(S with mobility and ADL)    Equipment Recommendations  3 in 1 bedside commode    Recommendations for Other Services      Precautions / Restrictions Precautions Precautions: Fall Precaution Comments: abdominal pain Restrictions Weight Bearing Restrictions: No       Mobility Bed Mobility Overal bed mobility: Modified Independent             General bed mobility comments: painful  Transfers Overall transfer level: Needs assistance Equipment used: None Transfers: Sit to/from Stand Sit to Stand: Min guard         General transfer comment: pt unsteady, min guard for balance. Pt reaching with hands to brace self and hold onto things    Balance Overall balance assessment: Needs assistance Sitting-balance support: No upper extremity supported;Feet  supported Sitting balance-Leahy Scale: Fair       Standing balance-Leahy Scale: Fair Standing balance comment: LOB to R. Able to self correct                           ADL either performed or assessed with clinical judgement   ADL Overall ADL's : Needs assistance/impaired     Grooming: Set up;Sitting   Upper Body Bathing: Set up;Sitting   Lower Body Bathing: Set up;Sit to/from stand   Upper Body Dressing : Set up;Sitting   Lower Body Dressing: Set up;Sit to/from stand;Supervision/safety   Toilet Transfer: Min guard;Ambulation   Toileting- Clothing Manipulation and Hygiene: Supervision/safety;Sit to/from stand       Functional mobility during ADLs: Min guard General ADL Comments: Complaining of L groin pain during mobility; educated on alternative technique for bed mobility (rolling to sidelying instead of sitting strainght up into long sitting)to reduce pain. Pt verbalized understanidng.      Vision       Perception     Praxis      Cognition Arousal/Alertness: Awake/alert Behavior During Therapy: Flat affect Overall Cognitive Status: No family/caregiver present to determine baseline cognitive functioning                                          Exercises     Shoulder Instructions       General Comments      Pertinent Vitals/ Pain  Pain Assessment: 0-10 Pain Score: 8  Pain Location: L groin - incision, abdominal pain  Pain Descriptors / Indicators: Grimacing;Guarding Pain Intervention(s): Premedicated before session  Home Living                                          Prior Functioning/Environment              Frequency  Min 2X/week        Progress Toward Goals  OT Goals(current goals can now be found in the care plan section)  Progress towards OT goals: Progressing toward goals  Acute Rehab OT Goals Patient Stated Goal: stop the pain OT Goal Formulation: With patient Potential  to Achieve Goals: Good ADL Goals Pt Will Perform Grooming: Independently;standing Pt Will Perform Lower Body Bathing: Independently;sit to/from stand Pt Will Perform Lower Body Dressing: Independently;sit to/from stand Pt Will Transfer to Toilet: Independently;ambulating;regular height toilet Pt Will Perform Toileting - Clothing Manipulation and hygiene: Independently;sit to/from stand Pt Will Perform Tub/Shower Transfer: Tub transfer;Independently;ambulating Pt/caregiver will Perform Home Exercise Program: Increased strength;Both right and left upper extremity;Independently  Plan Discharge plan needs to be updated    Co-evaluation                 AM-PAC PT "6 Clicks" Daily Activity     Outcome Measure   Help from another person eating meals?: None Help from another person taking care of personal grooming?: A Little Help from another person toileting, which includes using toliet, bedpan, or urinal?: A Little Help from another person bathing (including washing, rinsing, drying)?: A Little Help from another person to put on and taking off regular upper body clothing?: None Help from another person to put on and taking off regular lower body clothing?: A Little 6 Click Score: 20    End of Session Equipment Utilized During Treatment: Gait belt  OT Visit Diagnosis: Unsteadiness on feet (R26.81);Pain Pain - part of body: (abdomen/groin)   Activity Tolerance Patient tolerated treatment well   Patient Left in bed;with call bell/phone within reach   Nurse Communication Mobility status        Time: 1535-1550 OT Time Calculation (min): 15 min  Charges: OT General Charges $OT Visit: 1 Visit OT Treatments $Self Care/Home Management : 8-22 mins  [facility], OT/L  [phone] 07/10/2017   Ignazio Kincaid,HILLARY 07/10/2017, 3:53 PM

## 2017-07-10 NOTE — Clinical Social Work Placement (Signed)
   CLINICAL SOCIAL WORK PLACEMENT  NOTE  Date:  07/10/2017  Patient Details  Name: Thomas [name] MRN: [id number] Date of Birth: Jun 27, 1955  Clinical Social Work is seeking post-discharge placement for this patient at the [place] level of care (*CSW will initial, date and re-position this form in  chart as items are completed):  Yes   Patient/family provided with [place] Work Department's list of facilities offering this level of care within the geographic area requested by the patient (or if unable, by the patient's family).  Yes   Patient/family informed of their freedom to choose among providers that offer the needed level of care, that participate in Medicare, Medicaid or managed care program needed by the patient, have an available bed and are willing to accept the patient.  Yes   Patient/family informed of [facility]'s ownership interest in [facility] and [facility], as well as of the fact that they are under no obligation to receive care at these facilities.  PASRR submitted to EDS on 07/10/17     PASRR number received on 07/10/17     Existing PASRR number confirmed on       FL2 transmitted to all facilities in geographic area requested by pt/family on 07/10/17     FL2 transmitted to all facilities within larger geographic area on       Patient informed that his/her managed care company has contracts with or will negotiate with certain facilities, including the following:            Patient/family informed of bed offers received.  Patient chooses bed at       Physician recommends and patient chooses bed at      Patient to be transferred to   on  .  Patient to be transferred to facility by       Patient family notified on   of transfer.  Name of family member notified:        PHYSICIAN Please sign FL2     Additional Comment:    _______________________________________________ Candie Chroman, LCSW 07/10/2017, 2:38  PM

## 2017-07-10 NOTE — Care Management Note (Signed)
Case Management  Original Note Created by  Marvetta Gibbons RN, BSN Unit 4E-Case Manager- [place] coverage [phone]  Patient Details  Name: Thomas [name] MRN: [id number] Date of Birth: 09/20/1955  Subjective/Objective:  Pt admitted with weakness, chills, dizziness, cough, left-sided chest pain, abdominal pain, nausea and vomiting to [facility]- tx to [facility] for acute stroke- MRI positive for acute left cerebellar infarcts possibly septic emboli vs. hypotension in the setting of vascular stenosis.  S/p emergent endovascular repair of ruptured AAA, bilateral iliofemoral endarterectomy, BLE thromboembolectomies on 07/01/17- remains on vent post op with plan to extubate 07/02/17                Action/Plan: PTA  Pt lived at home was independent with ADLs and worked as a Presenter, broadcasting. Pt will have family/friends to assist when ready to transition home.  Per MD note most likely will need extended coarse of abx ?iv for discharge- CM to follow for transition of care needs.   Expected Discharge Date:                 Expected Discharge Plan:  OP Rehab  In-House Referral:     Discharge planning Services  CM Consult  Post Acute Care Choice:    Choice offered to:     DME Arranged:    DME Agency:     HH Arranged:  RN, PT [place] Agency:  [place] [facility]  Status of Service:  In process, will continue to follow  If discussed at Long Length of Stay Meetings, dates discussed:    Discharge Disposition:   Additional Comments: 07/10/2017  SNF now being recommended for pt - CSW actively working placement  07/09/17 [place] successfully arranged for to come to Short Stay for RN interventions associated with PICC line and labs.  PT changed recommendation to include the need for 24 hour supervision.  CM informed attending of new recommendation of 24 hour supervision which pt confirms he does not have .  Pt informed of recommendation and informed CM that he is in agreement for SNF if required due to lack of supervision.  CM  informed CSW of potential placement need.  CM attempted to contact therapy multiple times to discuss recommendation.  07/06/17 Update 1640:  CM informed that by [facility] that pt actually does not have IV infusion nor HH benefits with current insurance.  [place] working with pt/family to set pt up on payment plan for infusion, train family on IV antibiotic administration and arrange Short Stay to see pt once a week for labs and dressing changes (per [facility] pt will not require HHRN for infusion with these arrangements).  Pt will need to go to outpt PT for therapy as HH is not covered.  CM texted paged attending  Update: CM met with pt and daughter at bedside - pt is independent from home alone.  Daughter has agreed to come to pts house daily and help with IV antibiotics and dressing changes.  [facility] to meet with pt and daughter today at 3pm.  Based on the pt needs for [facility] he will need to have HHPT instead of outpt PT - Attending informed and orders requested.  [facility] aware of HHRN and PT need - referral accepted  Pt now on 2c.  Per I&D; pt will need 6 weeks IV antibiotic Rocephin - per [facility] note they are already following .  Maryclare Labrador, RN 07/10/2017, 9:58 AM

## 2017-07-10 NOTE — Progress Notes (Addendum)
  Progress Note    07/10/2017 8:12 AM 9 Days Post-Op  Subjective:  Persistent dull LLQ pain   Vitals:   07/10/17 0356 07/10/17 0700  BP: 118/60 115/69  Pulse: 78 75  Resp: 18   Temp: 98.8 F (37.1 C) 98.6 F (37 C)  SpO2: 96% 97%   Physical Exam: Lungs:  Non labored on RA Incisions:  R groin incision healing well; L groin incision with small seroma proximal aspect, no drainage or obvious infection Extremities:  Palpable DP pulses L > R Abdomen:  Soft, tender to deep palpation LLQ Neurologic: baseline  CBC    Component Value Date/Time   WBC 13.7 (H) 07/10/2017 0322   RBC 3.84 (L) 07/10/2017 0322   HGB 9.8 (L) 07/10/2017 0322   HCT 29.5 (L) 07/10/2017 0322   PLT 472 (H) 07/10/2017 0322   MCV 76.8 (L) 07/10/2017 0322   MCV 77.8 (A) 06/28/2017 1335   MCH 25.5 (L) 07/10/2017 0322   MCHC 33.2 07/10/2017 0322   RDW 15.3 07/10/2017 0322   LYMPHSABS 4.1 (H) 07/09/2017 0420   MONOABS 1.1 (H) 07/09/2017 0420   EOSABS 0.7 07/09/2017 0420   BASOSABS 0.0 07/09/2017 0420    BMET    Component Value Date/Time   NA 132 (L) 07/10/2017 0322   K 3.6 07/10/2017 0322   CL 102 07/10/2017 0322   CO2 23 07/10/2017 0322   GLUCOSE 103 (H) 07/10/2017 0322   BUN 11 07/10/2017 0322   CREATININE 1.22 07/10/2017 0322   CALCIUM 7.8 (L) 07/10/2017 0322   GFRNONAA >60 07/10/2017 0322   GFRAA >60 07/10/2017 0322    INR    Component Value Date/Time   INR 1.04 07/01/2017 1632     Intake/Output Summary (Last 24 hours) at 07/10/2017 0812 Last data filed at 07/10/2017 0348 Gross per 24 hour  Intake 2405 ml  Output 2670 ml  Net -265 ml     Assessment/Plan:  62 y.o. male is s/p endovascular repair of abdominal aortic aneurysm bilateral iliofemoral endarterectomies  9 Days Post-Op   Perfusing BLE; no indication for revasc of R endograft limb at this time IV antibiotics via PICC for suspected endocarditis per ID Ok for discharge from vascular surgery standpoint when medically  stable  DVT prophylaxis:  lovenox   Dagoberto Ligas, PA-C [facility] [phone] 07/10/2017 8:12 AM  Agree with the above.  WBC down.  No acute vascular issues  Annamarie Major

## 2017-07-10 NOTE — NC FL2 (Signed)
[place] LEVEL OF CARE SCREENING TOOL     IDENTIFICATION  Patient Name: Thomas [name] Birthdate: 04-19-1955 Sex: male Admission Date (Current Location): 06/28/2017  [facility] and [place] Number:  Herbalist and Address:  [facility], [place] [address] [place], [place] [address]      Provider Number: [id number]  Attending Physician Name and Address:  Cherene Altes, MD  Relative Name and Phone Number:       Current Level of Care: Hospital Recommended Level of Care: [place] Prior Approval Number:    Date Approved/Denied:   PASRR Number: [id number]  Discharge Plan: SNF    Current Diagnoses: Patient Active Problem List   Diagnosis Date Noted  . Sepsis due to Streptococcus pneumoniae ([place])   . Acute respiratory failure with hypoxia ([place])   . Hemorrhagic shock ([place]) 07/02/2017  . Acute blood loss anemia 07/02/2017  . S/P AAA repair   . LLQ abdominal pain   . Ruptured abdominal aortic aneurysm (AAA) ([place])   . Hypotension   . Bacteremia due to Streptococcus 06/30/2017  . CVA (cerebral vascular accident) ([place]) 06/30/2017  . Chest pain 06/28/2017  . CAP (community acquired pneumonia): Clinical 06/28/2017  . Acute renal failure superimposed on stage 3 chronic kidney disease ([place]) 06/28/2017  . Dehydration 06/28/2017  . Leukocytosis 06/28/2017  . Elevated troponin 06/28/2017  . Dizziness 06/28/2017  . Vertigo 06/28/2017  . Acute pericarditis: Probable 06/28/2017    Orientation RESPIRATION BLADDER Height & Weight     Self, Time, Situation, Place  Normal Continent Weight: 137 lb 12.6 oz (62.5 kg) Height:  5\' 5"  (165.1 cm)  BEHAVIORAL SYMPTOMS/MOOD NEUROLOGICAL BOWEL NUTRITION STATUS  (None) (CVA) Continent Diet(Regular)  AMBULATORY STATUS COMMUNICATION OF NEEDS Skin   Limited Assist Verbally Surgical wounds                       Personal Care Assistance Level of Assistance  Bathing, Feeding,  Dressing Bathing Assistance: Limited assistance Feeding assistance: Limited assistance Dressing Assistance: Limited assistance     Functional Limitations Info  Sight, Hearing, Speech Sight Info: Adequate Hearing Info: Adequate Speech Info: Adequate    SPECIAL CARE FACTORS FREQUENCY  PT (By licensed PT), Blood pressure     PT Frequency: 5 x week              Contractures Contractures Info: Not present    Additional Factors Info  Code Status, Allergies Code Status Info: Full Allergies Info: NKDA           Current Medications (07/10/2017):  This is the current hospital active medication list Current Facility-Administered Medications  Medication Dose Route Frequency Provider Last Rate Last Dose  . acetaminophen (TYLENOL) solution 650 mg  650 mg Oral Q6H PRN Cherene Altes, MD      . aspirin EC tablet 81 mg  81 mg Oral Daily Serafina Mitchell, MD   81 mg at 07/10/17 0914  . atorvastatin (LIPITOR) tablet 40 mg  40 mg Oral q1800 Darlina Sicilian A, NP   40 mg at 07/09/17 1751  . cefTRIAXone (ROCEPHIN) 2 g in sodium chloride 0.9 % 100 mL IVPB  2 g Intravenous Q12H Marijean Heath, NP   Stopped at 07/10/17 0348  . enoxaparin (LOVENOX) injection 30 mg  30 mg Subcutaneous Q12H Serafina Mitchell, MD   30 mg at 07/10/17 0914  . feeding supplement (BOOST / RESOURCE BREEZE) liquid 1 Container  1 Container Oral TID BM Cherene Altes, MD   1 Container at 07/09/17 2000  . magnesium citrate solution 1 Bottle  1 Bottle Oral Once PRN Whiteheart, Cristal Ford, NP      . multivitamin with minerals tablet 1 tablet  1 tablet Oral Daily Cherene Altes, MD   1 tablet at 07/10/17 0914  . ondansetron (ZOFRAN) injection 4 mg  4 mg Intravenous Q6H PRN Darlina Sicilian A, NP   4 mg at 07/08/17 1004  . oxyCODONE (Oxy IR/ROXICODONE) immediate release tablet 5-10 mg  5-10 mg Oral Q6H PRN Cherene Altes, MD   10 mg at 07/10/17 0153  . pantoprazole (PROTONIX) EC tablet 40 mg  40 mg  Oral Q1200 Cherene Altes, MD   40 mg at 07/10/17 1253  . senna-docusate (Senokot-S) tablet 2 tablet  2 tablet Oral QHS Marijean Heath, NP   2 tablet at 07/09/17 2129  . simethicone (MYLICON) chewable tablet 80 mg  80 mg Oral Q6H PRN Darlina Sicilian A, NP   80 mg at 07/04/17 0409  . sodium chloride flush (NS) 0.9 % injection 10-40 mL  10-40 mL Intracatheter PRN Whiteheart, Kathryn A, NP      . sorbitol 70 % solution 30 mL  30 mL Oral Daily PRN Whiteheart, Kathryn A, NP   30 mL at 07/04/17 1600  . traMADol (ULTRAM) tablet 50 mg  50 mg Oral Q6H PRN Cherene Altes, MD   50 mg at 07/10/17 0421     Discharge Medications: Please see discharge summary for a list of discharge medications.  Relevant Imaging Results:  Relevant Lab Results:   Additional Information SS#: [id number]  Candie Chroman, LCSW

## 2017-07-10 NOTE — Progress Notes (Signed)
Thomas [name] TEAM 1 - Stepdown/ICU TEAM  Charlyne Mom  NAT:[id number] DOB: [birth year] DOA: 06/28/2017 PCP: Patient, No Pcp Per    Brief Narrative:  62yo M w/ a hx of Tobacco Abuse, HTN, and CKD Stage 3 who presented to the ED on 4/4 with fever, chills, nonproductive cough, and chest pain. WBC 24.1. CXR with interstitial lung disease with pleural scarring. EKG with ST elevation in the anterior leads. Cardiology was consulted and felt this was due to acute pericarditis. Started on ASA. Placed on azithromycin and rocephin for probable clinical pneumonia. Admitted to [facility].  During this admission an MRI Brain obtained d/u to c/o dizziness noted 3 acute areas of CVA in the left cerebellum as well as evidence of old small bilateral cerebellar infarcts and old basal ganglia/thalamic infarcts.  Blood cultures returned positive for Pnemococcal bacteremia, raising a concern for bacterial endocarditis.   On 4/7 patient the pt developed diffuse abdominal pain with hypotension in the 70s. CT A/P noted a 6.6 cm diameter aortic aneurysm with findings concerning for rupture. He was taken to OR for endovascular repair of ruptured AAA.  In the PACU he was noted to have absent fem pulses and was taken back to OR for bilateral iliofemoral endarterectomy, BLE thromboembolectomies  Significant Events: 4/4 admit   4/6 MRI noted 3 small acute CVAs 4/7 acute AAA rupture - emergent repair in OR - return to OR w/ B LE thrombosis  4/11 [facility] assumed care 4/11 PICC placed   Subjective: Patient reported a fever again last night but his T-max was only 99.4.  He reports that his left flank pain is steadily improving.  He reports some discomfort at his left groin incision.  He denies chest pain shortness of breath nausea vomiting.  He continues to feel very weak in general and is in agreement with placement within a rehab facility if this can be approved by his insurance company.  Assessment & Plan:  Low grade temp elevation / FUO  - worsening leukocytosis  Worrisome for occult nidus of infection - repeat CT abdom without evidence of abscess formation - fever curve remains unimpressive - repeat blood cx 4/14 remain negative at this time - UA unremarkable - appears this issue has resolved    Ruptured Aortic Aneurysm s/p stent placement - large RPH  S/p emergent endovascular repair 4/7 w/ B iliofemoral endarterectomy and subsequent B LE thromboembolectomies - care per Vascular Surgery, who feel he is progressing nicely and ready for d/c   S/P Bilateral LE Thrombectomies   CTA 4/9 shows thrombus in the right limb of the graft - Vascular directing care of this issue - no indication for thrombectomy - clinically pt has stabilized w/ no evidence of impaired flow to LE  Aneurysm of the ascending thoracic aorta measuring at least 4.4cm Noted on CT chest - will need serial f/u as oupt   Strep pneumo bacteremia - concern for Bacterial Endocarditis - Pericarditis  No vegetation on TTE - abx per ID - decision has been made to forgo TEE - f/u blood cx on 06/30/17 negative x5 days - repeat blood cx from 4/14 NGTD - to continue IV abx coverage until end date of Aug 12, 2017 per ID - has R UE PICC to facilitate long term abx   LLL PNA - presumed Strep pneumo - resolved  On abx coverage - f/u CXR suggests improvement - no persisting clinical sx   3 acute LEFT cerebellar infarcts  Pt reported dizziness at admit -  septic emboli v/s hypotension - Neurology evaluated - MRI also noted old small bilateral cerebellar infarcts + old basal ganglia and thalamus infarcts - Neuro suggests ASA 81mg  daily  RLE weakness and numbness - PVD more consistent with PVD per Neurology - essentially resolved at this time - walking w/ PT/OT   Acute hypoxic respiratory failure  in setting hemorrhagic shock r/t ruptured AAA - resolved  extubated 4/8 - has been weaned to RA   ILD per CT will need outpatient follow up for ILD per PCCM   Hemorrhagic Shock /  Acute blood loss anemia in setting of Ruptured AAA Hgb stable   Recent Labs  Lab 07/07/17 0438 07/08/17 0420 07/09/17 0420 07/10/17 0322  HGB 11.5* 10.8* 10.0* 9.8*    Intracranial vascular occlusion / stenosis MRA noted right M1 occlusion, likely chronic, bilateral PCA stenosis, and terminal right vertebral artery stenosis - avoid hypotension   HTN Goal SBP 110-140 per Neuro recs - wish to avoid extremes of hypotension or hypertension - BP stable presently    Indeterminate L adrenal nodule  Noted on CTa chest/abd/pelvis 4/9 - will need f/u imaging as outpt  HLD  On Lipitor     Chronic Kidney Disease Stage 3  crt stable   Recent Labs  Lab 07/05/17 0515 07/06/17 0330 07/07/17 0438 07/09/17 0420 07/10/17 0322  CREATININE 1.21 1.15 1.17 1.27* 1.22    DVT prophylaxis: SCDs + lovenox  Code Status: FULL CODE Family Communication: no family present at time of exam  Disposition Plan: needs SNF for rehab stay - CSW working on this case  Consultants:  Vasc Surgery  PCCM ID  Neurology   Antimicrobials:  Azithromycin 4/4  Rocephin 4/4 >  Ancef 4/7   Objective: Blood pressure 115/69, pulse 75, temperature 98.6 F (37 C), temperature source Oral, resp. rate 18, height 5\' 5"  (1.651 m), weight 62.5 kg (137 lb 12.6 oz), SpO2 97 %.  Intake/Output Summary (Last 24 hours) at 07/10/2017 0955 Last data filed at 07/10/2017 0348 Gross per 24 hour  Intake 2165 ml  Output 2450 ml  Net -285 ml   Filed Weights   07/08/17 0800 07/09/17 1242 07/10/17 0426  Weight: 57.1 kg (125 lb 12.8 oz) 58.9 kg (129 lb 12.8 oz) 62.5 kg (137 lb 12.6 oz)    Examination: General: No acute respiratory distress - A&O and conversant  Lungs: CTA th/o w/o wheeze  Cardiovascular: RRR Abdomen: modestly tender to palpation in L lower quad/flank w/o change, no rebound, BS+, not distended Extremities: no edema B LE - B feet warm to touch still - L groin wound intact w/o drainage   CBC: Recent Labs    Lab 07/03/17 1854  07/08/17 0420 07/09/17 0420 07/10/17 0322  WBC 15.7*   < > 20.1* 14.0* 13.7*  NEUTROABS 12.5*  --   --  8.1*  --   HGB 10.3*   < > 10.8* 10.0* 9.8*  HCT 35.5*   < > 31.6* 30.1* 29.5*  MCV 88.5   < > 76.1* 78.4 76.8*  PLT 204   < > 371 428* 472*   < > = values in this interval not displayed.   Basic Metabolic Panel: Recent Labs  Lab 07/06/17 0330 07/07/17 0438 07/09/17 0420 07/10/17 0322  NA 137 133* 134* 132*  K 3.2* 3.7 3.8 3.6  CL 105 102 104 102  CO2 24 22 24 23   GLUCOSE 108* 96 97 103*  BUN 17 16 13 11   CREATININE 1.15 1.17 1.27*  1.22  CALCIUM 7.7* 7.6* 7.9* 7.8*  MG 2.2  --   --   --   PHOS 2.9  --   --   --    GFR: Estimated Creatinine Clearance: 53.9 mL/min (by C-G formula based on SCr of 1.22 mg/dL).  Liver Function Tests: Recent Labs  Lab 07/04/17 0359 07/06/17 0330  AST 151* 81*  ALT 50 60  ALKPHOS 92 102  BILITOT 0.6 1.0  PROT 5.7* 6.0*  ALBUMIN 2.1* 2.1*    Cardiac Enzymes: Recent Labs  Lab 07/04/17 2041 07/05/17 0515 07/05/17 1418 07/06/17 0330 07/07/17 0438  CKTOTAL 3,399* 2,734* 2,091* 1,207* 1,014*    HbA1C: Hgb A1c MFr Bld  Date/Time Value Ref Range Status  06/29/2017 03:20 AM 5.6 4.8 - 5.6 % Final    Comment:    (NOTE) Pre diabetes:          5.7%-6.4% Diabetes:              >6.4% Glycemic control for   <7.0% adults with diabetes     CBG: Recent Labs  Lab 07/03/17 2333 07/04/17 0543 07/04/17 1207 07/04/17 1845 07/04/17 2314  GLUCAP 161* 167* 158* 100* 132*    Recent Results (from the past 240 hour(s))  Culture, blood (routine x 2)     Status: None   Collection Time: 06/30/17  2:18 PM  Result Value Ref Range Status   Specimen Description BLOOD RIGHT ARM  Final   Special Requests   Final    BOTTLES DRAWN AEROBIC AND ANAEROBIC Blood Culture adequate volume   Culture   Final    NO GROWTH 5 DAYS Performed at [facility], [place] [address] [place], [place] [address]    Report Status  07/05/2017 FINAL  Final  Culture, blood (routine x 2)     Status: None   Collection Time: 06/30/17  2:23 PM  Result Value Ref Range Status   Specimen Description BLOOD RIGHT FOREARM  Final   Special Requests   Final    BOTTLES DRAWN AEROBIC AND ANAEROBIC Blood Culture adequate volume   Culture   Final    NO GROWTH 5 DAYS Performed at [facility], [place] [address] [place], [place] [address]    Report Status 07/05/2017 FINAL  Final  MRSA PCR Screening     Status: None   Collection Time: 07/01/17 11:42 PM  Result Value Ref Range Status   MRSA by PCR NEGATIVE NEGATIVE Final    Comment:        The GeneXpert MRSA Assay (FDA approved for NASAL specimens only), is one component of a comprehensive MRSA colonization surveillance program. It is not intended to diagnose MRSA infection nor to guide or monitor treatment for MRSA infections. Performed at [facility], [place] [address], [place], [place] [address]   Culture, respiratory (NON-Expectorated)     Status: None   Collection Time: 07/02/17  4:45 AM  Result Value Ref Range Status   Specimen Description TRACHEAL ASPIRATE  Final   Special Requests NONE  Final   Gram Stain   Final    FEW WBC PRESENT, PREDOMINANTLY PMN FEW GRAM NEGATIVE RODS FEW GRAM POSITIVE COCCI IN PAIRS RARE GRAM NEGATIVE COCCOBACILLI    Culture   Final    RARE Consistent with normal respiratory flora. Performed at [facility], [place] [address], [place], [place] [address]    Report Status 07/04/2017 FINAL  Final  Culture, blood (routine x 2)  Status: None (Preliminary result)   Collection Time: 07/08/17 11:49 AM  Result Value Ref Range Status   Specimen Description BLOOD LEFT ARM  Final   Special Requests   Final    BOTTLES DRAWN AEROBIC AND ANAEROBIC Blood Culture adequate volume   Culture   Final    NO GROWTH 1 DAY Performed at [facility], [address] [place], [place] [address]    Report Status PENDING  Incomplete    Culture, blood (routine x 2)     Status: None (Preliminary result)   Collection Time: 07/08/17 11:57 AM  Result Value Ref Range Status   Specimen Description BLOOD LEFT HAND  Final   Special Requests   Final    BOTTLES DRAWN AEROBIC AND ANAEROBIC Blood Culture adequate volume   Culture   Final    NO GROWTH 1 DAY Performed at [facility], [place] [address], [place], [place] [address]    Report Status PENDING  Incomplete  Culture, Urine     Status: None   Collection Time: 07/08/17  1:59 PM  Result Value Ref Range Status   Specimen Description URINE, RANDOM  Final   Special Requests NONE  Final   Culture   Final    NO GROWTH Performed at [facility], [place] [address] [place], [place] [address]    Report Status 07/09/2017 FINAL  Final     Scheduled Meds: . aspirin EC  81 mg Oral Daily  . atorvastatin  40 mg Oral q1800  . enoxaparin (LOVENOX) injection  30 mg Subcutaneous Q12H  . feeding supplement  1 Container Oral TID BM  . multivitamin with minerals  1 tablet Oral Daily  . pantoprazole  40 mg Oral Q1200  . senna-docusate  2 tablet Oral QHS     LOS: 12 days   Cherene Altes, MD [facility] Office  [phone] Pager - Text Page per Amion as per below:  On-Call/Text Page:      [name]      password [email or web address]  If 7PM-7AM, please contact night-coverage [email or web address] Password [email or web address] 07/10/2017, 9:55 AM

## 2017-07-11 LAB — BASIC METABOLIC PANEL
Anion gap: 6 (ref 5–15)
BUN: 13 mg/dL (ref 6–20)
CALCIUM: 8.1 mg/dL — AB (ref 8.9–10.3)
CO2: 23 mmol/L (ref 22–32)
CREATININE: 1.18 mg/dL (ref 0.61–1.24)
Chloride: 103 mmol/L (ref 101–111)
Glucose, Bld: 114 mg/dL — ABNORMAL HIGH (ref 65–99)
Potassium: 3.4 mmol/L — ABNORMAL LOW (ref 3.5–5.1)
SODIUM: 132 mmol/L — AB (ref 135–145)

## 2017-07-11 LAB — CBC
HCT: 31.1 % — ABNORMAL LOW (ref 39.0–52.0)
Hemoglobin: 10.5 g/dL — ABNORMAL LOW (ref 13.0–17.0)
MCH: 26.2 pg (ref 26.0–34.0)
MCHC: 33.8 g/dL (ref 30.0–36.0)
MCV: 77.6 fL — ABNORMAL LOW (ref 78.0–100.0)
PLATELETS: 570 10*3/uL — AB (ref 150–400)
RBC: 4.01 MIL/uL — ABNORMAL LOW (ref 4.22–5.81)
RDW: 15.6 % — AB (ref 11.5–15.5)
WBC: 12.4 10*3/uL — ABNORMAL HIGH (ref 4.0–10.5)

## 2017-07-11 NOTE — Progress Notes (Signed)
PROGRESS NOTE    Thomas [name]  JKK:[id number] DOB: April 29, 1955 DOA: 06/28/2017 PCP: Patient, No Pcp Per   Brief Narrative:  62 year old with history of tobacco use, CKD stage III, essential hypertension came to the ER on April 4 with complaints of fevers, chills, nonproductive cough and chest pain.  Patient initially had elevated white count of 24 and chest x-ray suggested of interstitial lung disease.  EKG showed ST elevation which was concerning for acute pericarditis per cardiology.  Patient was started on azithromycin and Rocephin as well for pneumonia.  Patient will start on aspirin.  During the admission patient also had an MRI of the brain as he had reported of dizziness which showed 2 areas of CVA in the left cerebellum.  Blood cultures ended up growing pneumococcal bacteremia raising concern for bacterial endocarditis. On April 7 patient became hypotensive and reported abdominal pain.  CT of the abdomen pelvis showed concerns for ruptured abdominal aortic aneurysm which was 6.6 cm in size therefore he was emergently taken to the operating room.  Endovascular repair was performed.  In the PACU there is concerns of absent pulses therefore he was taken back to the OR and had bilateral iliofemoral endarterectomy bilateral lower extremity thrombo-embolectomies were performed.  Assessment & Plan:   Principal Problem:   Ruptured abdominal aortic aneurysm (AAA) (HCC) Active Problems:   Chest pain   CAP (community acquired pneumonia): Clinical   Acute renal failure superimposed on stage 3 chronic kidney disease (HCC)   Dehydration   Leukocytosis   Elevated troponin   Dizziness   Vertigo   Acute pericarditis: Probable   Bacteremia due to Streptococcus   CVA (cerebral vascular accident) ([place])   LLQ abdominal pain   Hypotension   Hemorrhagic shock (HCC)   Acute blood loss anemia   S/P AAA repair   Sepsis due to Streptococcus pneumoniae ([place])   Acute respiratory failure with hypoxia  (HCC)   Ruptured aortic aneurysm status post stent placement Severe peripheral arterial disease with possible emboli status post bilateral iliofemoral endarterectomy and embolectomy - Patient appears to be stable from vascular surgery standpoint -Needs to follow-up outpatient with them at their discretion  Strep pneumo bacteremia Slight leukocytosis - Repeat cultures and workup has remained negative - At this point patient has right upper extremity PICC line in place for which will get IV antibiotics-Rocephin 2g q12hr until Aug 12, 2017 per infectious disease -Will need weekly CBC with differential and BMP - fax at [phone] -PICC line can be pulled out after the antibiotics is completed -Follow-up with outpatient infectious disease in 3-4 weeks  Left lower lobe pneumonia -Suspect strep pneumonia.  Patient is not on IV antibiotics  Dizziness and right lower extremity weakness and numbness Acute left cerebellar infarct Intracranial vascular disease -Septic versus secondary to hypotension.  MRI confirms this.  Neurology recommended aspirin 81 mg.  Patient has done physical therapy who recommends patient would benefit from skilled nursing facility therefore arrangements to be made social worker -Avoid hypotension  Peripheral vascular disease -Continue statin and aspirin  Essential hypertension -Stable  Interstitial lung disease - Incidentally seen on the CAT scan.  Can follow-up outpatient with pulmonary, if necessary referral can be obtained from primary care physician    DVT prophylaxis: SCDs Code Status: Full code Family Communication: None at bedside Disposition Plan: Skilled nursing facility, awaiting placement  Consultants:   Vascular surgery  Pulmonary  Infectious disease  Neurology    Subjective: No complaints this morning.  Reports  that at times he feels hot and sweaty but his temperature remains euthermic  Review of Systems Otherwise negative except  as per HPI, including: General: Denies fever, chills, night sweats or unintended weight loss. Resp: Denies cough, wheezing, shortness of breath. Cardiac: Denies chest pain, palpitations, orthopnea, paroxysmal nocturnal dyspnea. GI: Denies abdominal pain, nausea, vomiting, diarrhea or constipation GU: Denies dysuria, frequency, hesitancy or incontinence MS: Denies muscle aches, joint pain or swelling Neuro: Denies headache, neurologic deficits (focal weakness, numbness, tingling), abnormal gait Psych: Denies anxiety, depression, SI/HI/AVH Skin: Denies new rashes or lesions ID: Denies sick contacts, exotic exposures, travel  Objective: Vitals:   07/10/17 1500 07/10/17 1940 07/10/17 2330 07/11/17 0527  BP: 129/77 (!) 150/83 121/73   Pulse: 72 72 75   Resp:  15 16   Temp: 98.7 F (37.1 C) 98.6 F (37 C) 98.6 F (37 C)   TempSrc: Oral Oral Oral   SpO2: 99% 100% 97%   Weight:    56.9 kg (125 lb 8 oz)  Height:        Intake/Output Summary (Last 24 hours) at 07/11/2017 1432 Last data filed at 07/11/2017 1200 Gross per 24 hour  Intake 680 ml  Output -  Net 680 ml   Filed Weights   07/09/17 1242 07/10/17 0426 07/11/17 0527  Weight: 58.9 kg (129 lb 12.8 oz) 62.5 kg (137 lb 12.6 oz) 56.9 kg (125 lb 8 oz)    Examination:  General exam: Appears calm and comfortable  Respiratory system: Clear to auscultation. Respiratory effort normal. Cardiovascular system: S1 & S2 heard, RRR. No JVD, murmurs, rubs, gallops or clicks. No pedal edema. Gastrointestinal system: Abdomen is nondistended, soft and nontender. No organomegaly or masses felt. Normal bowel sounds heard. Central nervous system: Alert and oriented. No focal neurological deficits. Extremities: Symmetric 5 x 5 power.  Surgical scar noted in the groin without any evidence of acute infection or bleeding Skin: No rashes, lesions or ulcers Psychiatry: Judgement and insight appear normal. Mood & affect appropriate.     Data  Reviewed:   CBC: Recent Labs  Lab 07/07/17 0438 07/08/17 0420 07/09/17 0420 07/10/17 0322 07/11/17 0430  WBC 20.0* 20.1* 14.0* 13.7* 12.4*  NEUTROABS  --   --  8.1*  --   --   HGB 11.5* 10.8* 10.0* 9.8* 10.5*  HCT 33.9* 31.6* 30.1* 29.5* 31.1*  MCV 76.2* 76.1* 78.4 76.8* 77.6*  PLT 339 371 428* 472* 671*   Basic Metabolic Panel: Recent Labs  Lab 07/06/17 0330 07/07/17 0438 07/09/17 0420 07/10/17 0322 07/11/17 0430  NA 137 133* 134* 132* 132*  K 3.2* 3.7 3.8 3.6 3.4*  CL 105 102 104 102 103  CO2 24 22 24 23 23   GLUCOSE 108* 96 97 103* 114*  BUN 17 16 13 11 13   CREATININE 1.15 1.17 1.27* 1.22 1.18  CALCIUM 7.7* 7.6* 7.9* 7.8* 8.1*  MG 2.2  --   --   --   --   PHOS 2.9  --   --   --   --    GFR: Estimated Creatinine Clearance: 51.6 mL/min (by C-G formula based on SCr of 1.18 mg/dL). Liver Function Tests: Recent Labs  Lab 07/06/17 0330  AST 81*  ALT 60  ALKPHOS 102  BILITOT 1.0  PROT 6.0*  ALBUMIN 2.1*   No results for input(s): LIPASE, AMYLASE in the last 168 hours. No results for input(s): AMMONIA in the last 168 hours. Coagulation Profile: No results for input(s): INR, PROTIME in  the last 168 hours. Cardiac Enzymes: Recent Labs  Lab 07/04/17 2041 07/05/17 0515 07/05/17 1418 07/06/17 0330 07/07/17 0438  CKTOTAL 3,399* 2,734* 2,091* 1,207* 1,014*   BNP (last 3 results) No results for input(s): PROBNP in the last 8760 hours. HbA1C: No results for input(s): HGBA1C in the last 72 hours. CBG: Recent Labs  Lab 07/04/17 1845 07/04/17 2314  GLUCAP 100* 132*   Lipid Profile: No results for input(s): CHOL, HDL, LDLCALC, TRIG, CHOLHDL, LDLDIRECT in the last 72 hours. Thyroid Function Tests: No results for input(s): TSH, T4TOTAL, FREET4, T3FREE, THYROIDAB in the last 72 hours. Anemia Panel: No results for input(s): VITAMINB12, FOLATE, FERRITIN, TIBC, IRON, RETICCTPCT in the last 72 hours. Sepsis Labs: No results for input(s): PROCALCITON,  LATICACIDVEN in the last 168 hours.  Recent Results (from the past 240 hour(s))  MRSA PCR Screening     Status: None   Collection Time: 07/01/17 11:42 PM  Result Value Ref Range Status   MRSA by PCR NEGATIVE NEGATIVE Final    Comment:        The GeneXpert MRSA Assay (FDA approved for NASAL specimens only), is one component of a comprehensive MRSA colonization surveillance program. It is not intended to diagnose MRSA infection nor to guide or monitor treatment for MRSA infections. Performed at [facility], [place] [address] [place], [place] [address]   Culture, respiratory (NON-Expectorated)     Status: None   Collection Time: 07/02/17  4:45 AM  Result Value Ref Range Status   Specimen Description TRACHEAL ASPIRATE  Final   Special Requests NONE  Final   Gram Stain   Final    FEW WBC PRESENT, PREDOMINANTLY PMN FEW GRAM NEGATIVE RODS FEW GRAM POSITIVE COCCI IN PAIRS RARE GRAM NEGATIVE COCCOBACILLI    Culture   Final    RARE Consistent with normal respiratory flora. Performed at [facility], [place] [address], [place], [place] [address]    Report Status 07/04/2017 FINAL  Final  Culture, blood (routine x 2)     Status: None (Preliminary result)   Collection Time: 07/08/17 11:49 AM  Result Value Ref Range Status   Specimen Description BLOOD LEFT ARM  Final   Special Requests   Final    BOTTLES DRAWN AEROBIC AND ANAEROBIC Blood Culture adequate volume   Culture   Final    NO GROWTH 2 DAYS Performed at [facility], [place] [address] [place], [place] [address]    Report Status PENDING  Incomplete  Culture, blood (routine x 2)     Status: None (Preliminary result)   Collection Time: 07/08/17 11:57 AM  Result Value Ref Range Status   Specimen Description BLOOD LEFT HAND  Final   Special Requests   Final    BOTTLES DRAWN AEROBIC AND ANAEROBIC Blood Culture adequate volume   Culture   Final    NO GROWTH 2 DAYS Performed at [facility], [place] [address] [place], [place] [address]    Report Status PENDING  Incomplete  Culture, Urine     Status: None   Collection Time: 07/08/17  1:59 PM  Result Value Ref Range Status   Specimen Description URINE, RANDOM  Final   Special Requests NONE  Final   Culture   Final    NO GROWTH Performed at [facility], [place] [address] [place], [place] [address]    Report Status 07/09/2017 FINAL  Final         Radiology  Studies: No results found.      Scheduled Meds: . aspirin EC  81 mg Oral Daily  . atorvastatin  40 mg Oral q1800  . enoxaparin (LOVENOX) injection  30 mg Subcutaneous Q12H  . feeding supplement  1 Container Oral TID BM  . multivitamin with minerals  1 tablet Oral Daily  . pantoprazole  40 mg Oral Q1200  . senna-docusate  2 tablet Oral QHS   Continuous Infusions: . cefTRIAXone (ROCEPHIN)  IV Stopped (07/11/17 0510)     LOS: 13 days    Time spent: 20 mins    Ankit Arsenio Loader, MD [facility] Pager [phone]   If 7PM-7AM, please contact night-coverage [email or web address] Password [facility] 07/11/2017, 2:32 PM

## 2017-07-11 NOTE — Clinical Social Work Note (Addendum)
CSW met with patient, daughter, and cousin Zenda Alpers Hlong: [phone]) at bedside. Discussed SNF options. First preference was [facility] but they would have to pay 40% out of pocket since the facility is not in network with [employer]. Next preference is [facility] due to proximity to family. CSW notified clinical liaison for the facility. She will review referral and if they can accept him, will start insurance authorization.  Dayton Scrape, [place] [phone]  2:15 pm [facility] can extend a bed offer and will start insurance authorization.  Dayton Scrape, [place]

## 2017-07-12 MED ORDER — ATORVASTATIN CALCIUM 40 MG PO TABS: 40.0000 mg | ORAL_TABLET | Freq: Every day | ORAL | 0 refills | Status: DC

## 2017-07-12 MED ORDER — PANTOPRAZOLE SODIUM 40 MG PO TBEC: 40.0000 mg | DELAYED_RELEASE_TABLET | Freq: Every day | ORAL | 0 refills | Status: DC

## 2017-07-12 MED ORDER — SODIUM CHLORIDE 0.9 % IV SOLN: 2.0000 g | Freq: Two times a day (BID) | INTRAVENOUS | 0 refills | Status: AC

## 2017-07-12 MED ORDER — ASPIRIN 81 MG PO TBEC: 81.0000 mg | DELAYED_RELEASE_TABLET | Freq: Every day | ORAL | 0 refills | Status: DC

## 2017-07-12 MED ORDER — PRO-STAT SUGAR FREE PO LIQD
30.0000 mL | Freq: Three times a day (TID) | ORAL | Status: DC
  Administered 2017-07-12 – 2017-07-14 (×6): 30 mL via ORAL
  Filled 2017-07-12 (×6): qty 30

## 2017-07-12 MED ORDER — SENNOSIDES-DOCUSATE SODIUM 8.6-50 MG PO TABS: 2.0000 | ORAL_TABLET | Freq: Every day | ORAL | 0 refills | Status: DC

## 2017-07-12 MED ORDER — OXYCODONE HCL 5 MG PO TABS: 5.0000 mg | ORAL_TABLET | Freq: Four times a day (QID) | ORAL | 0 refills | Status: AC | PRN

## 2017-07-12 MED ORDER — CEFTRIAXONE IV (FOR PTA / DISCHARGE USE ONLY): 2.0000 g | Freq: Two times a day (BID) | INTRAVENOUS | 0 refills | Status: DC

## 2017-07-12 NOTE — Progress Notes (Signed)
Nutrition Follow-up  DOCUMENTATION CODES:   Not applicable  INTERVENTION:   -Continue Boost Breeze po TID, each supplement provides 250 kcal and 9 grams of protein -30 ml Prostat TID, each supplement provides 100 kcals and 15 grams protein -Continue MVI daily  NUTRITION DIAGNOSIS:   Inadequate oral intake related to decreased appetite as evidenced by per patient/family report, meal completion < 25%.  Ongoing  GOAL:   Patient will meet greater than or equal to 90% of their needs  Unmet  MONITOR:   PO intake, Labs, Weight trends, I & O's, Supplement acceptance  REASON FOR ASSESSMENT:   Consult Assessment of nutrition requirement/status, Poor PO, Diet education  ASSESSMENT:   Thomas [name] is a 62 y.o. male with medical history significant of ongoing tobacco abuse otherwise unremarkable presented to the ED with sudden onset of subjective fevers, chills, diaphoresis, nonproductive cough that started at 4 AM on the day of admission with 3 episodes of emesis and some nausea.  Reviewed I/O's: +800 ml x 24 hours and +5.9 L since admission  Case discussed with RN, who reports pt continues to complain of poor appetite. He refused his Boost Breeze earlier today. Meal intake continues to be poor; PO: 10-50%.  Reviewed wt hx; noted pt has experienced a 12% wt loss over the past week, however, unsure of accuracy of wt.   Attempted to speak with pt, however, pt dd not respond to RD's questions. Observed meal tray at bedside- pt consumed less than 25% of meal. Boost Breeze supplement at bedside, which pt consumed about 25%.   Per CSW notes, pt awaiting insurance authorization for SNF placement.   Labs reviewed: Na: 132, K: 3.4.   Diet Order:  Diet regular Room service appropriate? Yes; Fluid consistency: Thin  EDUCATION NEEDS:   Education needs have been addressed  Skin:  Skin Assessment: Skin Integrity Issues: Skin Integrity Issues:: Incisions Incisions: rt groin and lt  groin  Last BM:  07/07/17  Height:   Ht Readings from Last 1 Encounters:  07/02/17 5\' 5"  (1.651 m)    Weight:   Wt Readings from Last 1 Encounters:  07/11/17 125 lb 8 oz (56.9 kg)    Ideal Body Weight:  59.1 kg  BMI:  Body mass index is 20.88 kg/m.  Estimated Nutritional Needs:   Kcal:  1700-1900 kcal  Protein:  80-95 grams  Fluid:  > 1.7 L    Reathel Turi A. Jimmye Norman, RD, LDN, CDE Pager: [phone] After hours Pager: [phone]

## 2017-07-12 NOTE — Progress Notes (Signed)
    Subjective  - s/p ruptured AAA repair  Still with back pain   Physical Exam:  Groin incisions intact.  Small seroma at apex of left groin Palpable pedal pulses       Assessment/Plan:    Stable from vascular perspective Continue IV ABX  Thomas [name] 07/12/2017 8:17 AM --  Vitals:   07/11/17 2344 07/12/17 0745  BP: 131/67   Pulse: 76 76  Resp: 16 15  Temp: 98.5 F (36.9 C)   SpO2: 99% 99%    Intake/Output Summary (Last 24 hours) at 07/12/2017 0817 Last data filed at 07/12/2017 0505 Gross per 24 hour  Intake 560 ml  Output -  Net 560 ml     Laboratory CBC    Component Value Date/Time   WBC 12.4 (H) 07/11/2017 0430   HGB 10.5 (L) 07/11/2017 0430   HCT 31.1 (L) 07/11/2017 0430   PLT 570 (H) 07/11/2017 0430    BMET    Component Value Date/Time   NA 132 (L) 07/11/2017 0430   K 3.4 (L) 07/11/2017 0430   CL 103 07/11/2017 0430   CO2 23 07/11/2017 0430   GLUCOSE 114 (H) 07/11/2017 0430   BUN 13 07/11/2017 0430   CREATININE 1.18 07/11/2017 0430   CALCIUM 8.1 (L) 07/11/2017 0430   GFRNONAA >60 07/11/2017 0430   GFRAA >60 07/11/2017 0430    COAG Lab Results  Component Value Date   INR 1.04 07/01/2017   INR 1.23 06/29/2017   No results found for: PTT  Antibiotics Anti-infectives (From admission, onward)   Start     Dose/Rate Route Frequency Ordered Stop   06/30/17 1600  cefTRIAXone (ROCEPHIN) 2 g in sodium chloride 0.9 % 100 mL IVPB     2 g 200 mL/hr over 30 Minutes Intravenous Every 12 hours 06/30/17 1354     06/29/17 2200  azithromycin (ZITHROMAX) tablet 500 mg  Status:  Discontinued     500 mg Oral Daily at bedtime 06/29/17 1159 06/29/17 1454   06/29/17 1500  cefTRIAXone (ROCEPHIN) 2 g in sodium chloride 0.9 % 100 mL IVPB  Status:  Discontinued     2 g 200 mL/hr over 30 Minutes Intravenous Every 24 hours 06/29/17 1454 06/30/17 1354   06/28/17 2000  cefTRIAXone (ROCEPHIN) 1 g in sodium chloride 0.9 % 100 mL IVPB  Status:  Discontinued       1 g 200 mL/hr over 30 Minutes Intravenous Every 24 hours 06/28/17 1839 06/29/17 1454   06/28/17 2000  azithromycin (ZITHROMAX) 500 mg in sodium chloride 0.9 % 250 mL IVPB  Status:  Discontinued     500 mg 250 mL/hr over 60 Minutes Intravenous Every 24 hours 06/28/17 1839 06/29/17 1159       V. Leia Alf, M.D. [facility] Office: [phone] Pager:  [phone]

## 2017-07-12 NOTE — Discharge Summary (Addendum)
Physician Discharge Summary  Thomas [name] HOZ:[id number] DOB: 1955-08-09 DOA: 06/28/2017  PCP: Patient, No Pcp Per  Admit date: 06/28/2017 Discharge date: 07/12/2017  Admitted From: Home  Disposition:  Home  UPDATE 07/14/17: No other acute events overnight.  Patient is hemodynamically stable.  Plans have changed insert patient going to skilled nursing facility, he will be going home.  He will also get outpatient PT.  Education about medication administration has been provided and arrangements have been made.  UPDATE 07/13/2017: No acute events overnight.  Patient hemodynamically remained stable.  Continue care as outlined below.  Recommendations for Outpatient Follow-up:  1. Follow up with PCP in 1-2 weeks 2. Please obtain BMP/CBC weekly and follow-up with infectious disease in 3-4 weeks 3. Follow-up with vascular surgery in 2-3 weeks 4. On Rocephin 2 g every 12 hours until Aug 12, 2017 per infectious disease  Home Health: None Equipment/Devices: None Discharge Condition: Stable CODE STATUS: Full Diet recommendation: 2 g sodium diet  Brief/Interim Summary: 62 year old with history of tobacco use, CKD stage III, essential hypertension came to the ER on April 4 with complaints of fevers, chills, nonproductive cough and chest pain.  Patient initially had elevated white count of 24 and chest x-ray suggested of interstitial lung disease.  EKG showed ST elevation which was concerning for acute pericarditis per cardiology.  Patient was started on azithromycin and Rocephin as well for pneumonia.  Patient will start on aspirin.  During the admission patient also had an MRI of the brain as he had reported of dizziness which showed 2 areas of CVA in the left cerebellum.  Blood cultures ended up growing pneumococcal bacteremia raising concern for bacterial endocarditis. On April 7 patient became hypotensive and reported abdominal pain.  CT of the abdomen pelvis showed concerns for ruptured abdominal aortic  aneurysm which was 6.6 cm in size therefore he was emergently taken to the operating room.  Endovascular repair was performed.  In the PACU there is concerns of absent pulses therefore he was taken back to the OR and had bilateral iliofemoral endarterectomy bilateral lower extremity thrombo-embolectomies were performed.  Due to concerns for bacterial endocarditis infectious disease recommended continuing patient on Rocephin 2 g every 12 hours until Aug 12, 2017.  Was advised to obtain CBC with differential and BMP weekly and fax it to [phone] and follow-up with infectious disease in 3-4 weeks.  Also follow-up with outpatient vascular surgery.  At this time patient has reached maximum benefit from an hospital stay and stable to be discharged to skilled nursing facility for further rehabilitation.   HEENT/EYES = negative for pain, redness, loss of vision, double vision, blurred vision, loss of hearing, sore throat, hoarseness, dysphagia Cardiovascular= negative for chest pain, palpitation, murmurs, lower extremity swelling Respiratory/lungs= negative for shortness of breath, cough, hemoptysis, wheezing, mucus production Gastrointestinal= negative for nausea, vomiting,, abdominal pain, melena, hematemesis Genitourinary= negative for Dysuria, Hematuria, Change in Urinary Frequency MSK = Negative for arthralgia, myalgias, Back Pain, Joint swelling  Neurology= Negative for headache, seizures, numbness, tingling  Psychiatry= Negative for anxiety, depression, suicidal and homocidal ideation Allergy/Immunology= Medication/Food allergy as listed  Skin= Negative for Rash, lesions, ulcers, itching    Discharge Diagnoses:  Principal Problem:   Ruptured abdominal aortic aneurysm (AAA) (HCC) Active Problems:   Chest pain   CAP (community acquired pneumonia): Clinical   Acute renal failure superimposed on stage 3 chronic kidney disease (HCC)   Dehydration   Leukocytosis   Elevated troponin    Dizziness   Vertigo  Acute pericarditis: Probable   Bacteremia due to Streptococcus   CVA (cerebral vascular accident) ([place])   LLQ abdominal pain   Hypotension   Hemorrhagic shock (HCC)   Acute blood loss anemia   S/P AAA repair   Sepsis due to Streptococcus pneumoniae ([place])   Acute respiratory failure with hypoxia (HCC)  Ruptured aortic aneurysm status post stent placement Severe peripheral arterial disease with possible emboli status post bilateral iliofemoral endarterectomy and embolectomy - Patient appears to be stable from vascular surgery standpoint -Needs to follow-up outpatient with them at their discretion  Strep pneumo bacteremia Slight leukocytosis - Repeat cultures and workup has remained negative - At this point patient has right upper extremity PICC line in place for which will get IV antibiotics-Rocephin 2g q12hr until Aug 12, 2017 per infectious disease -Will need weekly CBC with differential and BMP - fax at [phone] -PICC line can be pulled out after the antibiotics is completed.  -Follow-up with outpatient infectious disease in 3-4 weeks  Left lower lobe pneumonia -Suspect strep pneumonia.  Patient is on IV antibiotics - Rocehpine 2g IV q12hrs.   Dizziness and right lower extremity weakness and numbness Acute left cerebellar infarct Intracranial vascular disease -Septic versus secondary to hypotension.  MRI confirms this.  Neurology recommended aspirin 81 mg.  Patient has done physical therapy who recommends patient would benefit from skilled nursing facility therefore arrangements made by the SW.  -Avoid hypotension  Peripheral vascular disease -Continue statin and aspirin  Essential hypertension -Stable  Interstitial lung disease - Incidentally seen on the CAT scan.  Can follow-up outpatient with pulmonary, if necessary referral can be obtained from primary care physician     Discharge Instructions  Discharge Instructions     Ambulatory referral to Neurology   Complete by:  As directed    Pt will follow up with Dr. Erlinda Hong at [facility] in about 4 weeks. Thanks.   Home infusion instructions [facility] May follow [place] Dosing Protocol; May administer Cathflo as needed to maintain patency of vascular access device.; Flushing of vascular access device: per [facility] Protocol: 0.9% NaCl pre/post medica...   Complete by:  As directed    Instructions:  May follow [place] Dosing Protocol   Instructions:  May administer Cathflo as needed to maintain patency of vascular access device.   Instructions:  Flushing of vascular access device: per [facility] Protocol: 0.9% NaCl pre/post medication administration and prn patency; Heparin 100 u/ml, 20m for implanted ports and Heparin 10u/ml, 539mfor all other central venous catheters.   Instructions:  May follow [facility] Anaphylaxis Protocol for First Dose Administration in the home: 0.9% NaCl at 25-50 ml/hr to maintain IV access for protocol meds. Epinephrine 0.3 ml IV/IM PRN and Benadryl 25-50 IV/IM PRN s/s of anaphylaxis.   Instructions:  [facility] Coordinator (RN) to assist per patient IV care needs in the home PRN.     Allergies as of 07/12/2017   No Known Allergies     Medication List    STOP taking these medications   aspirin-sod bicarb-citric acid 325 MG Tbef tablet Commonly known as:  ALKA-SELTZER     TAKE these medications   aspirin 81 MG EC tablet Take 1 tablet (81 mg total) by mouth daily. Start taking on:  07/13/2017   atorvastatin 40 MG tablet Commonly known as:  LIPITOR Take 1 tablet (40 mg total) by mouth daily at 6 PM.   cefTRIAXone 2 g in sodium chloride 0.9 %  100 mL Inject 2 g into the vein every 12 (twelve) hours.   cefTRIAXone IVPB Commonly known as:  ROCEPHIN Inject 2 g into the vein every 12 (twelve) hours. Indication:  Pneumococcal bacteremia and possible endovascular infection with CNS emboli Last Day of Therapy:  08/12/17 Labs - Once  weekly:  CBC/D and BMP, Labs - Every other week:  ESR and CRP   cyclobenzaprine 10 MG tablet Commonly known as:  FLEXERIL Take 1 tablet (10 mg total) by mouth 2 (two) times daily as needed for muscle spasms.   oxyCODONE 5 MG immediate release tablet Commonly known as:  Oxy IR/ROXICODONE Take 1 tablet (5 mg total) by mouth every 6 (six) hours as needed for up to 3 days for severe pain.   pantoprazole 40 MG tablet Commonly known as:  PROTONIX Take 1 tablet (40 mg total) by mouth daily at 12 noon.   senna-docusate 8.6-50 MG tablet Commonly known as:  Senokot-S Take 2 tablets by mouth at bedtime.            Home Infusion Instuctions  (From admission, onward)        Start     Ordered   07/12/17 0000  Home infusion instructions [facility] May follow [place] Dosing Protocol; May administer Cathflo as needed to maintain patency of vascular access device.; Flushing of vascular access device: per [facility] Protocol: 0.9% NaCl pre/post medica...    Question Answer Comment  Instructions May follow [place] Dosing Protocol   Instructions May administer Cathflo as needed to maintain patency of vascular access device.   Instructions Flushing of vascular access device: per [facility] Protocol: 0.9% NaCl pre/post medication administration and prn patency; Heparin 100 u/ml, 71m for implanted ports and Heparin 10u/ml, 597mfor all other central venous catheters.   Instructions May follow [facility] Anaphylaxis Protocol for First Dose Administration in the home: 0.9% NaCl at 25-50 ml/hr to maintain IV access for protocol meds. Epinephrine 0.3 ml IV/IM PRN and Benadryl 25-50 IV/IM PRN s/s of anaphylaxis.   Instructions [facility] Infusion Coordinator (RN) to assist per patient IV care needs in the home PRN.      07/12/17 1039      Contact information for follow-up providers    [place]   Specialty:  Cardiology Contact information: [id number] [id number]     [facility] .   Specialty:  Radiology Contact information: [id number]     XuRosalin HawkingMD. Schedule an appointment as soon as possible for a visit in 4 week(s).   Specialty:  Neurology Contact information: [address] [phone]          Contact information for after-discharge care    Destination    [facility] .   Service:  Skilled Nursing Contact information: [phone]               No Known Allergies  You were cared for by a hospitalist during your hospital stay. If you have any questions about your discharge medications or the care you received while you were in the hospital after you are discharged, you can call the unit and asked to speak with the hospitalist on call if the hospitalist that  took care of you is not available. Once you are discharged, your primary care physician will handle any further medical issues. Please note that no refills for any discharge medications will be authorized once you are discharged, as it is imperative that you return to your primary care physician (or establish a relationship with a primary care physician if you do not have one) for your aftercare needs so that they can reassess your need for medications and monitor your lab values.  Consultations:  Vascular surgery  Pulmonary  Infectious disease  Neurology   Procedures/Studies: Ct Abdomen Pelvis Wo Contrast  Result Date: 07/08/2017 CLINICAL DATA:  Abdominal pain and fever. Recent endovascular repair of ruptured abdominal aortic aneurysm. EXAM: CT ABDOMEN AND PELVIS WITHOUT CONTRAST TECHNIQUE: Multidetector CT imaging of the abdomen and pelvis was performed  following the standard protocol without IV contrast. COMPARISON:  CT abdomen pelvis dated July 01, 2017. FINDINGS: Lower chest: No acute abnormality. Chronic interstitial changes at the lung bases are similar to prior study. Hepatobiliary: No focal liver abnormality. Small layering gallstones. No gallbladder wall thickening or biliary dilatation. Pancreas: Unremarkable. No pancreatic ductal dilatation or surrounding inflammatory changes. Spleen: Normal in size. Small subcapsular fluid collection may be slightly decreased in size. Adrenals/Urinary Tract: The right adrenal gland is unremarkable. Unchanged indeterminate 13 mm left adrenal nodule. Stable bilateral renal cysts. Unchanged right renal cortical thinning. No renal or ureteral calculi. No hydronephrosis. Mild circumferential wall thickening of the bladder, which is under distended. Unchanged small bilateral bladder diverticuli. Stomach/Bowel: Stomach is within normal limits. Appendix appears normal. No evidence of bowel wall thickening, distention, or inflammatory changes. Vascular/Lymphatic: Interval stent grafting of the infrarenal abdominal aortic aneurysm, which now measures approximately 6.3 x 5.9 cm, previously 6.6 x 6.0 cm. There is a small amount of air within the excluded aneurysm adjacent to the right iliac limb. Unchanged periaortic hematoma extending into the left psoas muscle. The left psoas muscle hematoma has slightly decreased in size, now measuring 7.0 x 5.1 cm, previously 8.9 x 5.6 cm. New small seromas in the bilateral groins likely related to prior access. No lymphadenopathy. Reproductive: Prostate is unremarkable. Other: No free fluid or pneumoperitoneum. Musculoskeletal: Small amount of subcutaneous emphysema in the left anterior abdominal wall is likely related to injections. No acute or significant osseous findings. IMPRESSION: 1. Interval stent grafting of the infrarenal abdominal aortic aneurysm, now slightly decreased in size,  measuring 6.3 cm, previously 6.6 cm. Small amount of air within the excluded aneurysm adjacent to the right iliac limb, which can be normal in the early postprocedural period. Infection is thought to be less likely. 2. Interval decrease in size of the left retroperitoneal hematoma. 3. No discrete intra-abdominal fluid collection. 4. Slight interval decrease in size of small subcapsular splenic hematoma. 5. Indeterminate 13 mm left adrenal nodule, probably benign given its size. Consider follow-up adrenal protocol CT in 12 months for further evaluation. This recommendation follows ACR consensus guidelines: Management of Incidental Adrenal Masses: A White Paper of the ACR Incidental Findings Committee. J Am Coll Radiol 2017;14:1038-1044. Electronically Signed   By: Titus Dubin M.D.   On: 07/08/2017 17:55   Dg Chest 2 View  Result Date: 06/28/2017 CLINICAL DATA:  Chest pain. EXAM: CHEST - 2 VIEW COMPARISON:  No prior. FINDINGS: Thoracic aorta is tortuous. Heart size normal. Diffuse bilateral pulmonary interstitial prominence noted. Although an active interstitial process may be present changes may be secondary to chronic interstitial lung disease. A component of bronchiectasis most likely present  in the lung bases. No focal alveolar infiltrate. Biapical and left-sided pleural thickening noted most likely secondary to scarring. Small left pleural effusion may be present. IMPRESSION: 1. Interstitial lung disease possibly chronic. A component of basilar bronchiectasis most likely present in the lung bases. Pleural scarring. 2. Tortuous thoracic aorta. Heart size normal. No pulmonary venous congestion. Electronically Signed   By: Marcello Moores  Register   On: 06/28/2017 13:55   Mr Jodene Nam Head Wo Contrast  Result Date: 06/30/2017 CLINICAL DATA:  Stroke EXAM: MRA HEAD WITHOUT CONTRAST TECHNIQUE: Angiographic images of the Circle of Willis were obtained using MRA technique without intravenous contrast. COMPARISON:  MRI head  06/28/2017 FINDINGS: Severe stenosis distal right vertebral artery, better seen on the MRA neck from today. Left vertebral artery patent to the basilar. Moderate stenosis origin of right AICA which contributes to the right PICA. Bilateral PICA patent. Basilar patent. Superior cerebellar and posterior cerebral arteries patent bilaterally. Mild to moderate stenosis in the posterior cerebral artery bilaterally. Internal carotid artery widely patent bilaterally. Occlusion of the proximal right anterior cerebral artery Left middle cerebral artery patent with mild irregularity distally. Anterior cerebral arteries patent bilaterally. Negative for cerebral aneurysm. IMPRESSION: Occlusion of the right M1 segment. Severe stenosis distal right vertebral artery. Mild to moderate stenosis in the posterior cerebral artery bilaterally. Electronically Signed   By: Franchot Gallo M.D.   On: 06/30/2017 12:51   Mr Jodene Nam Neck W Wo Contrast  Result Date: 06/30/2017 CLINICAL DATA:  Stroke EXAM: MRA NECK WITHOUT AND WITH CONTRAST TECHNIQUE: Multiplanar and multiecho pulse sequences of the neck were obtained without and with intravenous contrast. Angiographic images of the neck were obtained using MRA technique without and with intravenous contrast. CONTRAST:  66m MULTIHANCE GADOBENATE DIMEGLUMINE 529 MG/ML IV SOLN COMPARISON:  MRA head from today FINDINGS: Antegrade flow in carotid and vertebral arteries bilaterally. Left vertebral artery dominant and widely patent Small right vertebral artery with moderate to severe stenosis proximally and distally. This appears to have a small contribution to the basilar Carotid bifurcation widely patent bilaterally without carotid stenosis. IMPRESSION: Negative for carotid stenosis in the neck Dominant left vertebral artery widely patent. Small right vertebral artery with moderate to severe stenosis proximally and distally. Electronically Signed   By: CFranchot GalloM.D.   On: 06/30/2017 12:53   Mr  Brain Wo Contrast  Result Date: 06/28/2017 CLINICAL DATA:  Vertigo, suspect stroke.  Fever and cough. EXAM: MRI HEAD WITHOUT CONTRAST TECHNIQUE: Multiplanar, multiecho pulse sequences of the brain and surrounding structures were obtained without intravenous contrast. COMPARISON:  MRI of the head June 25, 2006 FINDINGS: Mild motion degraded examination. INTRACRANIAL CONTENTS: 3 LEFT cerebellar foci of reduced diffusion measuring to 14 mm with low ADC values. No susceptibility artifact to suggest hemorrhage. Old bilateral basal ganglia infarcts new from prior MRI. Old small bilateral basal ganglia and thalami infarcts. Mild ex vacuo dilatation subjacent lateral ventricle. No parenchymal brain volume loss for age. No hydrocephalus. No suspicious parenchymal signal, masses, mass effect. No abnormal extra-axial fluid collections. No extra-axial masses. VASCULAR: Normal major intracranial vascular flow voids present at skull base. SKULL AND UPPER CERVICAL SPINE: No abnormal sellar expansion. No suspicious calvarial bone marrow signal. Craniocervical junction maintained. SINUSES/ORBITS: Mild paranasal sinus mucosal thickening without air-fluid levels. Mastoid air cells are well aerated.The included ocular globes and orbital contents are non-suspicious. OTHER: None. IMPRESSION: 1. Three acute nonhemorrhagic small LEFT cerebellar infarcts. 2. Old small bilateral cerebellar infarcts. Old basal ganglia and thalamus infarcts. Electronically Signed  By: Elon Alas M.D.   On: 06/28/2017 21:16   Mr Brain W Contrast  Result Date: 06/30/2017 CLINICAL DATA:  Stroke EXAM: MRI HEAD WITH CONTRAST TECHNIQUE: Multiplanar, multiecho pulse sequences of the brain and surrounding structures were obtained with intravenous contrast. CONTRAST:  36m MULTIHANCE GADOBENATE DIMEGLUMINE 529 MG/ML IV SOLN COMPARISON:  06/28/2017 FINDINGS: Postcontrast imaging performed. Diffusion-weighted imaging not repeated. Small acute infarct left  cerebellum do not enhance. No enhancing mass lesion. Generalized atrophy. Chronic infarcts in the corona radiata bilaterally. IMPRESSION: Normal enhancement postcontrast infusion. Small acute infarcts left cerebellum do not enhance. Electronically Signed   By: CFranchot GalloM.D.   On: 06/30/2017 12:55   Ct Abdomen Pelvis W Contrast  Result Date: 07/01/2017 CLINICAL DATA:  Left lower quadrant pain. EXAM: CT ABDOMEN AND PELVIS WITH CONTRAST TECHNIQUE: Multidetector CT imaging of the abdomen and pelvis was performed using the standard protocol following bolus administration of intravenous contrast. CONTRAST:  528mISOVUE-300 IOPAMIDOL (ISOVUE-300) INJECTION 61% COMPARISON:  None. FINDINGS: Lower chest: Chronic interstitial changes noted in the peripheral lung bases bilaterally. Hepatobiliary: No focal abnormality within the liver parenchyma. There is no evidence for gallstones, gallbladder wall thickening, or pericholecystic fluid. No intrahepatic or extrahepatic biliary dilation. Pancreas: No focal mass lesion. No dilatation of the main duct. No intraparenchymal cyst. No peripancreatic edema. Spleen: Small subcapsular fluid collection in the spleen suggests a hematoma. Small defect in the subcapsular aspect of the posterior spleen (image 27/3) is compatible with a small infarct. Adrenals/Urinary Tract: Right adrenal gland unremarkable. 13 mm left adrenal nodule cannot be definitively characterized. Small hypodense lesions in each kidney are compatible with cysts. Cortical scarring noted in the upper pole of the right kidney. No evidence for hydroureter. Small bilateral bladder diverticuli suggests underlying component of bladder outlet obstruction. Stomach/Bowel: Stomach is nondistended. No gastric wall thickening. No evidence of outlet obstruction. Duodenum is normally positioned as is the ligament of Treitz. No small bowel wall thickening. No small bowel dilatation. The terminal ileum is normal.  Vascular/Lymphatic: 6.0 x 6.6 cm infrarenal fusiform abdominal aortic aneurysm identified with substantial mural thrombus. There is periaortic edema/hemorrhage tracking to the left with a relatively large hematoma lateral to the left psoas muscle measuring 16.3 x 5.6 x 8.9 cm. Aneurysm extends into the bifurcation with prominent atherosclerosis in the origin of each common iliac artery. There is no gastrohepatic or hepatoduodenal ligament lymphadenopathy. No intraperitoneal or retroperitoneal lymphadenopathy. No pelvic sidewall lymphadenopathy. Reproductive: The prostate gland and seminal vesicles have normal imaging features. Other: No intraperitoneal free fluid. Musculoskeletal: Bone windows reveal no worrisome lytic or sclerotic osseous lesions. Gas in the subcutaneous fat of the anterior abdominal wall bilaterally is presumably secondary to injections. IMPRESSION: 1. Fusiform infrarenal abdominal aortic aneurysm measures up to 6.6 cm and maximum diameter. There is Peri aneurysm all edema/hemorrhage with a large left-sided retroperitoneal hematoma lateral to the left psoas muscle in tracking down into the left pelvic sidewall. Imaging features are compatible with aortic aneurysm rupture. 2. Probable infarct posterior spleen with small subcapsular hematoma. 3. No substantial intraperitoneal free fluid. 4. Small low-density renal lesions bilaterally, likely cysts. 5. 13 mm left adrenal nodule cannot be further characterized on this study. Critical Value/emergent results were called by me at the time of interpretation on 07/01/2017 at 3:57 pm to Dr. DAIrine Seal who verbally acknowledged these results. Electronically Signed   By: ErMisty Stanley.D.   On: 07/01/2017 16:01   Dg Chest Port 1 View  Result Date: 07/04/2017  CLINICAL DATA:  Respiratory failure EXAM: PORTABLE CHEST 1 VIEW COMPARISON:  07/02/2017 FINDINGS: Endotracheal tube is been removed in the interval. Right jugular central line is again seen and  stable. Chronic fibrotic changes are again identified in both lungs. Improved aeration is noted in the bases bilaterally when compared with the prior exam. No bony abnormality is noted. IMPRESSION: Improved aeration in the bases bilaterally. Some chronic fibrotic changes are again identified and stable. No new focal abnormality is seen. Electronically Signed   By: Inez Catalina M.D.   On: 07/04/2017 08:58   Dg Chest Port 1 View  Result Date: 07/02/2017 CLINICAL DATA:  Endotracheal and central line placements. EXAM: PORTABLE CHEST 1 VIEW COMPARISON:  06/28/2017 FINDINGS: Endotracheal tube placed with tip measuring 4.4 cm above the carina. Right central venous catheter with tip over the low SVC region. No pneumothorax. Shallow inspiration with atelectasis in the lung bases, increasing since prior study. Small bilateral pleural effusions. Probable emphysematous changes and chronic fibrosis in the lungs. Cardiac enlargement without vascular congestion. No edema or consolidation. IMPRESSION: Appliances appear in satisfactory position. Shallow inspiration with increasing atelectasis in the lung bases. Small bilateral pleural effusions. Chronic emphysematous changes and fibrosis in the lungs. Cardiac enlargement. Electronically Signed   By: Lucienne Capers M.D.   On: 07/02/2017 00:42   Ct Angio Chest/abd/pel For Dissection W And/or W/wo  Result Date: 07/03/2017 CLINICAL DATA:  62 year old with an abdominal aortic aneurysm and abdominal hematoma. Follow-up aneurysm. Patient is hypotensive and concern for new or increased intra-abdominal bleeding. EXAM: CT ANGIOGRAPHY CHEST, ABDOMEN AND PELVIS TECHNIQUE: Multidetector CT imaging through the chest, abdomen and pelvis was performed using the standard protocol during bolus administration of intravenous contrast. Multiplanar reconstructed images and MIPs were obtained and reviewed to evaluate the vascular anatomy. CONTRAST:  27m ISOVUE-370 IOPAMIDOL (ISOVUE-370) INJECTION  76%, <See Chart> ISOVUE-370 IOPAMIDOL (ISOVUE-370) INJECTION 76% COMPARISON:  CT 07/01/2017 FINDINGS: CTA CHEST FINDINGS Cardiovascular: Coronary artery calcifications. The size of the ascending thoracic aorta is better calculated on the noncontrast images due to peripheral mural thrombus. The ascending thoracic aorta measures at least 4.4 cm. Evidence for irregular mural thrombus along the posterolateral wall of the ascending thoracic aorta. There is also eccentric thrombus at the aortic arch. Difficult to measure the true lumen of the descending thoracic aorta due to low-density material along the left side of the descending thoracic aorta which could represent mural thrombus and/or edema in this area. No clear evidence for intramural hematoma or dissection involving the thoracic aorta. Heart size is within normal limits. No significant pericardial fluid. Main and central pulmonary arteries are patent. Great vessels are patent. There is irregular plaque involving the proximal left subclavian artery. Mediastinum/Nodes: Gas adjacent to the right internal jugular vein compatible with recent catheterization. Low-density material throughout the mediastinum compatible with edema. Question a precarinal lymph node measuring 1.7 cm in the short axis on sequence 8, image 54. There may be a few prominent prevascular lymph nodes. Lungs/Pleura: Trace bilateral pleural fluid. Centrilobular emphysema. A small amount of mucus in the mainstem bronchi. Diffuse patchy airspace disease with areas of interstitial thickening. Findings are suggestive for pulmonary edema but cannot exclude widespread infectious or inflammatory process. Musculoskeletal: No acute abnormality. Review of the MIP images confirms the above findings. CTA ABDOMEN AND PELVIS FINDINGS VASCULAR Aorta: Endovascular repair of the abdominal aortic aneurysm. Stent graft is positioned below the renal veins. The main body of the stent graft is patent but there is a  high-grade stenosis  involving the right graft limb. Stenosis measures roughly 60-70%. Stenosis is related to a large amount of thrombus within the stent limb. The left stent limb is widely patent. There is pocket of gas anterior to the right limb and probably related to recent surgery. Aneurysm sac has minimally changed in size measuring 6.5 x 5.9 cm and previously measuring 6.6 x 6.0 cm. Limited evaluation for endoleak due to the lack of precontrast images through the abdominal aorta and the lack of delayed images. However, there is contrast in the IMA up to the origin and patient could be at risk for a type 2 endoleak. Celiac: Patent without evidence of aneurysm, dissection, vasculitis or significant stenosis. SMA: SMA is patent with mild narrowing in the proximal aspect. No evidence for dissection or aneurysm. Renals: Single right renal artery with irregularity along the proximal aspect. Findings compatible with mild-to-moderate stenosis approximately 1 cm from the right renal artery origin. There are at least 2 left renal arteries which are patent. No evidence for dissection or aneurysm involving the left renal arteries. IMA: There is contrast within the IMA including near the origin. IMA could be associated with a type 2 endoleak but difficult to characterize on these images. Inflow: Thrombus within the right graft limb as described in the aorta section. Stent graft terminates in the right common iliac artery. There is thrombus along the distal aspect of the right limb graft and thrombus at the right iliac artery bifurcation thrombus extending into the right hypogastric artery. Right external iliac artery is patent. There appears to be a small amount of thrombus in the proximal right SFA. Left limb graft is patent and terminates in the left common iliac artery. No significant thrombus or stenosis in the left limb graft. The left hypogastric artery is patent. Left external iliac artery is patent. Proximal left  femoral arteries are patent. Veins: Limited evaluation on this arterial phase of contrast. Review of the MIP images confirms the above findings. NON-VASCULAR Hepatobiliary: No gross abnormality to the liver. High-density material in the gallbladder. Pancreas: No gross abnormality to the pancreas. Spleen: Limited evaluation on this arterial phase of contrast. A small amount of fluid or stranding along the lateral aspect of the spleen which is similar to the prior examination. Adrenals/Urinary Tract: Again noted is a hyperdense nodule involving the medial left adrenal gland that measures roughly 1.4 cm. Right adrenal gland appears to be unremarkable. There is a cyst in the right kidney upper pole. Areas of cortical scarring and thinning in the right kidney. Mild fullness of the right renal collecting system. Urinary bladder is decompressed with a Foley catheter. Probable small cyst in the left kidney. Mild fullness in the left renal collecting system and proximal left ureter. Stomach/Bowel: Oral contrast within the colon. No acute abnormalities in stomach, small bowel or large bowel. Appendix has oral contrast without acute inflammatory changes. Lymphatic: Limited evaluation for lymphadenopathy due to the retroperitoneal blood and edema. There does not appear to be significant lymph node enlargement. Reproductive: Prostate is unremarkable. Other: Again noted is a large amount of blood in the left retroperitoneum, lateral and adjacent to the left iliopsoas muscle. The hematoma lateral to the left psoas muscle on sequence 8, image 205 measures 7.0 x 6.0 cm and previously measured 8.9 x 5.6 cm on the recent comparison examination. Hematoma adjacent to the left iliopsoas muscle in the left hemipelvis roughly measures 7.8 x 5.6 cm on sequence 8, image 247 and measured 8.5 x 7.6 cm on the  recent comparison examination. Patient now has diffuse subcutaneous edema. There is gas in left groin related to recent surgery.  Increased edema in the presacral region. Gas along the lower right anterior abdominal wall compatible with recent surgery. Musculoskeletal: No acute abnormality. Review of the MIP images confirms the above findings. IMPRESSION: 1. Left retroperitoneal hematoma has decreased in size and no evidence for new or increased retroperitoneal bleeding. 2. Endovascular repair of the abdominal aortic aneurysm. Gas within the abdominal aortic sac probably represents postoperative changes. Patient is at risk for a type 2 endoleak from the IMA but difficult to characterize on this examination and recommend attention on follow up imaging. 3. Thrombus in the right stent graft limb causing 60-70% narrowing of the lumen. Thrombus in the distal common iliac artery and at the iliac artery bifurcation. 4. Severe atherosclerotic disease involving the thoracic aorta. Thrombus within the right graft limb could be related to the recent surgery but patient is also at risk for embolic disease from the thoracic aorta. Right renal artery stenosis. 5. Aneurysm of the ascending thoracic aorta measuring at least 4.4 cm. No evidence for an thoracic aortic dissection. Recommend annual imaging followup by CTA or MRA. This recommendation follows 2010 ACCF/AHA/AATS/ACR/ASA/SCA/SCAI/SIR/STS/SVM Guidelines for the Diagnosis and Management of Patients with Thoracic Aortic Disease. Circulation. 2010; 121: e266-e369 6. Small bilateral effusions with diffuse bilateral lung densities. Findings are suggestive for pulmonary edema but cannot exclude infection. 7. Indeterminate left adrenal nodule. Recommend attention on follow up imaging. These results were called by telephone at the time of interpretation on 07/03/2017 at 11:18 am to Dr. Harold Barban , who verbally acknowledged these results. Electronically Signed   By: Markus Daft M.D.   On: 07/03/2017 11:53   [place] Ekg Site Rite  Result Date: 07/05/2017 If Site Rite image not attached, placement could not be  confirmed due to current cardiac rhythm.    Subjective:   Discharge Exam: Vitals:   07/11/17 2344 07/12/17 0745  BP: 131/67   Pulse: 76 76  Resp: 16 15  Temp: 98.5 F (36.9 C)   SpO2: 99% 99%   Vitals:   07/10/17 2330 07/11/17 0527 07/11/17 2344 07/12/17 0745  BP: 121/73  131/67   Pulse: 75  76 76  Resp: _0 Temp: 98.6 F (37 C)  98.5 F (36.9 C)   TempSrc: Oral  Oral   SpO2: 97%  99% 99%  Weight:  56.9 kg (125 lb 8 oz)    Height:        General: Pt is alert, awake, not in acute distress Cardiovascular: RRR, S1/S2 +, no rubs, no gallops Respiratory: CTA bilaterally, no wheezing, no rhonchi Abdominal: Soft, NT, ND, bowel sounds + Extremities: no edema, no cyanosis    The results of significant diagnostics from this hospitalization (including imaging, microbiology, ancillary and laboratory) are listed below for reference.     Microbiology: Recent Results (from the past 240 hour(s))  Culture, blood (routine x 2)     Status: None (Preliminary result)   Collection Time: 07/08/17 11:49 AM  Result Value Ref Range Status   Specimen Description BLOOD LEFT ARM  Final   Special Requests   Final    BOTTLES DRAWN AEROBIC AND ANAEROBIC Blood Culture adequate volume   Culture   Final    NO GROWTH 3 DAYS Performed at [facility], [address] [place], [place] [address]    Report Status PENDING  Incomplete  Culture, blood (routine x  2)     Status: None (Preliminary result)   Collection Time: 07/08/17 11:57 AM  Result Value Ref Range Status   Specimen Description BLOOD LEFT HAND  Final   Special Requests   Final    BOTTLES DRAWN AEROBIC AND ANAEROBIC Blood Culture adequate volume   Culture   Final    NO GROWTH 3 DAYS Performed at [facility], [address] [place], [place] [address]    Report Status PENDING  Incomplete  Culture, Urine     Status: None   Collection Time: 07/08/17  1:59 PM  Result Value Ref Range Status   Specimen Description  URINE, RANDOM  Final   Special Requests NONE  Final   Culture   Final    NO GROWTH Performed at [facility], [place] [address], [place], [place] [address]    Report Status 07/09/2017 FINAL  Final     Labs: BNP (last 3 results) No results for input(s): BNP in the last 8760 hours. Basic Metabolic Panel: Recent Labs  Lab 07/06/17 0330 07/07/17 0438 07/09/17 0420 07/10/17 0322 07/11/17 0430  NA 137 133* 134* 132* 132*  K 3.2* 3.7 3.8 3.6 3.4*  CL 105 102 104 102 103  CO2 _0 GLUCOSE 108* 96 97 103* 114*  BUN _1 CREATININE 1.15 1.17 1.27* 1.22 1.18  CALCIUM 7.7* 7.6* 7.9* 7.8* 8.1*  MG 2.2  --   --   --   --   PHOS 2.9  --   --   --   --    Liver Function Tests: Recent Labs  Lab 07/06/17 0330  AST 81*  ALT 60  ALKPHOS 102  BILITOT 1.0  PROT 6.0*  ALBUMIN 2.1*   No results for input(s): LIPASE, AMYLASE in the last 168 hours. No results for input(s): AMMONIA in the last 168 hours. CBC: Recent Labs  Lab 07/07/17 0438 07/08/17 0420 07/09/17 0420 07/10/17 0322 07/11/17 0430  WBC 20.0* 20.1* 14.0* 13.7* 12.4*  NEUTROABS  --   --  8.1*  --   --   HGB 11.5* 10.8* 10.0* 9.8* 10.5*  HCT 33.9* 31.6* 30.1* 29.5* 31.1*  MCV 76.2* 76.1* 78.4 76.8* 77.6*  PLT 339 371 428* 472* 570*   Cardiac Enzymes: Recent Labs  Lab 07/05/17 1418 07/06/17 0330 07/07/17 0438  CKTOTAL 2,091* 1,207* 1,014*   BNP: Invalid input(s): POCBNP CBG: No results for input(s): GLUCAP in the last 168 hours. D-Dimer No results for input(s): DDIMER in the last 72 hours. Hgb A1c No results for input(s): HGBA1C in the last 72 hours. Lipid Profile No results for input(s): CHOL, HDL, LDLCALC, TRIG, CHOLHDL, LDLDIRECT in the last 72 hours. Thyroid function studies No results for input(s): TSH, T4TOTAL, T3FREE, THYROIDAB in the last 72 hours.  Invalid input(s): FREET3 Anemia work up No results for input(s): VITAMINB12, FOLATE, FERRITIN, TIBC, IRON, RETICCTPCT in  the last 72 hours. Urinalysis    Component Value Date/Time   COLORURINE YELLOW 07/08/2017 1406   APPEARANCEUR CLEAR 07/08/2017 1406   LABSPEC 1.013 07/08/2017 1406   PHURINE 6.0 07/08/2017 1406   GLUCOSEU NEGATIVE 07/08/2017 1406   HGBUR NEGATIVE 07/08/2017 1406   [place] 07/08/2017 1406   BILIRUBINUR negative 06/28/2017 1415   KETONESUR NEGATIVE 07/08/2017 1406   PROTEINUR NEGATIVE 07/08/2017 1406   UROBILINOGEN 2.0 (A) 06/28/2017 1415   NITRITE NEGATIVE 07/08/2017 1406   LEUKOCYTESUR NEGATIVE 07/08/2017 1406   Sepsis Labs  Invalid input(s): PROCALCITONIN,  WBC,  LACTICIDVEN Microbiology Recent Results (from the past 240 hour(s))  Culture, blood (routine x 2)     Status: None (Preliminary result)   Collection Time: 07/08/17 11:49 AM  Result Value Ref Range Status   Specimen Description BLOOD LEFT ARM  Final   Special Requests   Final    BOTTLES DRAWN AEROBIC AND ANAEROBIC Blood Culture adequate volume   Culture   Final    NO GROWTH 3 DAYS Performed at [facility], [address] [place], [place] [address]    Report Status PENDING  Incomplete  Culture, blood (routine x 2)     Status: None (Preliminary result)   Collection Time: 07/08/17 11:57 AM  Result Value Ref Range Status   Specimen Description BLOOD LEFT HAND  Final   Special Requests   Final    BOTTLES DRAWN AEROBIC AND ANAEROBIC Blood Culture adequate volume   Culture   Final    NO GROWTH 3 DAYS Performed at [facility], [place] [address] [place], [place] [address]    Report Status PENDING  Incomplete  Culture, Urine     Status: None   Collection Time: 07/08/17  1:59 PM  Result Value Ref Range Status   Specimen Description URINE, RANDOM  Final   Special Requests NONE  Final   Culture   Final    NO GROWTH Performed at [facility], [place] [address], [place],  [address]    Report Status 07/09/2017 FINAL  Final    You were cared for by a hospitalist during your hospital  stay. If you have any questions about your discharge medications or the care you received while you were in the hospital after you are discharged, you can call the unit and asked to speak with the hospitalist on call if the hospitalist that took care of you is not available. Once you are discharged, your primary care physician will handle any further medical issues. Please note that NO REFILLS for any discharge medications will be authorized once you are discharged, as it is imperative that you return to your primary care physician (or establish a relationship with a primary care physician if you do not have one) for your aftercare needs so that they can reassess your need for medications and monitor your lab values.  Please request your Prim.MD to go over all Hospital Tests and Procedure/Radiological results at the follow up, please get all Hospital records sent to your Prim MD by signing hospital release before you go home.  Get CBC, CMP, 2 view Chest X ray checked  by Primary MD during your next visit or SNF MD in 5-7 days ( we routinely change or add medications that can affect your baseline labs and fluid status, therefore we recommend that you get the mentioned basic workup next visit with your PCP, your PCP may decide not to get them or add new tests based on their clinical decision)  On your next visit with your primary care physician please Get Medicines reviewed and adjusted.  If you experience worsening of your admission symptoms, develop shortness of breath, life threatening emergency, suicidal or homicidal thoughts you must seek medical attention immediately by calling 911 or calling your MD immediately  if symptoms less severe.  You Must read complete instructions/literature along with all the possible adverse reactions/side effects for all the Medicines you take and that have been prescribed to you. Take any new Medicines after you have  completely understood and accpet all the possible  adverse reactions/side effects.   Do not drive, operate heavy machinery, perform activities at heights, swimming or participation in water activities or provide baby sitting services if your were admitted for syncope or siezures until you have seen by Primary MD or a Neurologist and advised to do so again.  Do not drive when taking Pain medications.  Time coordinating discharge: 32 mins  SIGNED:   Damita Lack, MD  [facility] 07/12/2017, 10:39 AM Pager   If 7PM-7AM, please contact night-coverage [email or web address] Password [email or web address]

## 2017-07-12 NOTE — Progress Notes (Signed)
Occupational Therapy Treatment Patient Details Name: Thomas [name] MRN: [id number] DOB: 07-03-1955 Today's Date: 07/12/2017    History of present illness Pt is a 62 y.o. male with medical history significant of ongoing tobacco abuse otherwise unremarkable admitted 06/28/17 with sudden onset of subjective fevers, chills, diaphoresis, nonproductive cough, N&V and some EKG changes. R/o MI, but possible acute pericarditis. Pt with persistent dizziness; MRI 4/4 showed three acute nonhemorrhagic small L cerebellar infarcts. 4/7 acute AAA rupture requiring emergent repair; return to OR for BLE thrombosis with bilat iliofemoral endarterectomy.   OT comments  Pt fatiques; encouraged rest breaks, but he tends to push through activities.  He did not have any LOB today and denied dizziness.  Used RW.  Would benefit from SNF, but he prefers to go home.   Follow Up Recommendations  SNF;Home health OT;Supervision/Assistance - 24 hour    Equipment Recommendations  3 in 1 bedside commode    Recommendations for Other Services      Precautions / Restrictions Precautions Precautions: Fall Precaution Comments: abdominal pain Restrictions Weight Bearing Restrictions: No       Mobility Bed Mobility Overal bed mobility: Modified Independent             General bed mobility comments: performed sidelying  Transfers   Equipment used: Rolling walker (2 wheeled)   Sit to Stand: Min guard         General transfer comment: for safety    Balance               Standing balance comment: no LOB today                           ADL either performed or assessed with clinical judgement   ADL       Grooming: Min guard;Standing;Oral care               Lower Body Dressing: Supervision/safety;Sit to/from stand   Toilet Transfer: Min guard;Ambulation             General ADL Comments: pt sat on commode and stood for oral care; cues for safety negotiating in tight spaces.   Requested to walk in hall.  Told him PT would be coming later to walk with him.  He states he can walk again afterwards.  Used RW.  No LOB but min guard for safety     Vision       Perception     Praxis      Cognition Arousal/Alertness: Awake/alert Behavior During Therapy: Flat affect Overall Cognitive Status: No family/caregiver present to determine baseline cognitive functioning                           Safety/Judgement: Decreased awareness of safety   Problem Solving: Requires verbal cues;Requires tactile cues          Exercises     Shoulder Instructions       General Comments      Pertinent Vitals/ Pain       Faces Pain Scale: Hurts even more Pain Location: L groin - incision, abdominal pain  Pain Descriptors / Indicators: Grimacing;Guarding Pain Intervention(s): Limited activity within patient's tolerance;Monitored during session;Premedicated before session;Repositioned  Home Living  Prior Functioning/Environment              Frequency           Progress Toward Goals  OT Goals(current goals can now be found in the care plan section)  Progress towards OT goals: Progressing toward goals     Plan      Co-evaluation                 AM-PAC PT "6 Clicks" Daily Activity     Outcome Measure   Help from another person eating meals?: None Help from another person taking care of personal grooming?: A Little Help from another person toileting, which includes using toliet, bedpan, or urinal?: A Little Help from another person bathing (including washing, rinsing, drying)?: A Little Help from another person to put on and taking off regular upper body clothing?: A Little Help from another person to put on and taking off regular lower body clothing?: A Little 6 Click Score: 19    End of Session Equipment Utilized During Treatment: Gait belt  OT Visit Diagnosis: Unsteadiness  on feet (R26.81);Pain   Activity Tolerance Patient tolerated treatment well   Patient Left in bed;with call bell/phone within reach;with bed alarm set   Nurse Communication          Time: [phone] OT Time Calculation (min): 15 min  Charges: OT General Charges $OT Visit: 1 Visit OT Treatments $Self Care/Home Management : 8-22 mins  Thomas [name], OTR/L [phone] 07/12/2017   Thomas [name] 07/12/2017, 8:24 AM

## 2017-07-12 NOTE — Progress Notes (Signed)
Md made aware that as per MSW  no placement made yet for SNF today awaiting insurance approval.

## 2017-07-12 NOTE — Progress Notes (Signed)
Physical Therapy Treatment Patient Details Name: Thomas [name] MRN: [id number] DOB: [birth year] Today's Date: 07/12/2017    History of Present Illness Pt is a 62 y.o. male with medical history significant of ongoing tobacco abuse otherwise unremarkable admitted 06/28/17 with sudden onset of subjective fevers, chills, diaphoresis, nonproductive cough, N&V and some EKG changes. R/o MI, but possible acute pericarditis. Pt with persistent dizziness; MRI 4/4 showed three acute nonhemorrhagic small L cerebellar infarcts. 4/7 acute AAA rupture requiring emergent repair; return to OR for BLE thrombosis with bilat iliofemoral endarterectomy.    PT Comments    Session focused on progressing independence with ambulation and balance exercises. Pt now ambulating hallway without assistive device with min guard due to mild path deviations and LOBx2 noted during session, min A to stabilize. Pt awaiting bed placement at SNF, recs still appropriate at this time.    Follow Up Recommendations  SNF;Supervision/Assistance - 24 hour     Equipment Recommendations       Recommendations for Other Services       Precautions / Restrictions Precautions Precautions: Fall    Mobility  Bed Mobility Overal bed mobility: Modified Independent                Transfers Overall transfer level: Needs assistance   Transfers: Sit to/from Stand Sit to Stand: Min guard         General transfer comment: guarding for safety with balance deficits  Ambulation/Gait Ambulation/Gait assistance: Min guard Ambulation Distance (Feet): 130 Feet Assistive device: None Gait Pattern/deviations: Drifts right/left   Gait velocity interpretation: 1.31 - 2.62 ft/sec, indicative of limited community ambulator General Gait Details: pt with smooth gait with 2 partial LOB with tactile cues to correct, min A to stabilize.    Stairs             Wheelchair Mobility    Modified Rankin (Stroke Patients Only)        Balance   Sitting-balance support: No upper extremity supported;Feet supported Sitting balance-Leahy Scale: Good                                      Cognition Arousal/Alertness: Awake/alert Behavior During Therapy: Flat affect Overall Cognitive Status: No family/caregiver present to determine baseline cognitive functioning                           Safety/Judgement: Decreased awareness of safety            Exercises Other Exercises Other Exercises: balance exercises, tandem, EO/EC, head turns.  5x30 seconds with sway, min guard to stabilze.     General Comments General comments (skin integrity, edema, etc.): (+) Head impulse test with catch up saccades BL      Pertinent Vitals/Pain Pain Score: 9  Pain Location: abdomen Pain Descriptors / Indicators: Constant;Guarding    Home Living                      Prior Function            PT Goals (current goals can now be found in the care plan section) Acute Rehab PT Goals PT Goal Formulation: With patient Time For Goal Achievement: 07/20/17 Potential to Achieve Goals: Good Progress towards PT goals: Progressing toward goals    Frequency    Min 3X/week      PT Plan  Co-evaluation              AM-PAC PT "6 Clicks" Daily Activity  Outcome Measure  Difficulty turning over in bed (including adjusting bedclothes, sheets and blankets)?: None Difficulty moving from lying on back to sitting on the side of the bed? : None Difficulty sitting down on and standing up from a chair with arms (e.g., wheelchair, bedside commode, etc,.)?: None Help needed moving to and from a bed to chair (including a wheelchair)?: A Little Help needed walking in hospital room?: A Little Help needed climbing 3-5 steps with a railing? : A Little 6 Click Score: 21    End of Session Equipment Utilized During Treatment: Gait belt Activity Tolerance: Patient tolerated treatment well Patient left:  in bed;with call bell/phone within reach Nurse Communication: Mobility status PT Visit Diagnosis: Other abnormalities of gait and mobility (R26.89)     Time: 6378-5885 PT Time Calculation (min) (ACUTE ONLY): 15 min  Charges:  $Gait Training: 8-22 mins                    G Codes:       Reinaldo Berber, PT, DPT Acute Rehab Services Pager: [phone]     Reinaldo Berber 07/12/2017, 12:58 PM

## 2017-07-13 LAB — CULTURE, BLOOD (ROUTINE X 2)
Culture: NO GROWTH
Culture: NO GROWTH
Special Requests: ADEQUATE
Special Requests: ADEQUATE

## 2017-07-13 MED ORDER — OXYCODONE HCL 5 MG PO TABS
10.0000 mg | ORAL_TABLET | Freq: Once | ORAL | Status: AC
  Administered 2017-07-13: 10 mg via ORAL

## 2017-07-13 NOTE — Progress Notes (Signed)
MD paged as pt hasn't had BM since 13th; abdomen soft/hyperactive/tender(as incision sites)/passing gas; pt not eating much. Scheduled senna given on night shift. MD orders: continue to monitor & if pt starts eating/drinking more w/o relief give miralax.   Thomas  Lilja Soland, RN

## 2017-07-13 NOTE — Progress Notes (Signed)
PROGRESS NOTE    Thomas [name]  EEF:[id number] DOB: February 16, 1956 DOA: 06/28/2017 PCP: Patient, No Pcp Per   Brief Narrative:  62 year old with history of tobacco use, CKD stage III, essential hypertension came to the ER on April 4 with complaints of fevers, chills, nonproductive cough and chest pain.  Patient initially had elevated white count of 24 and chest x-ray suggested of interstitial lung disease.  EKG showed ST elevation which was concerning for acute pericarditis per cardiology.  Patient was started on azithromycin and Rocephin as well for pneumonia.  Patient will start on aspirin.  During the admission patient also had an MRI of the brain as he had reported of dizziness which showed 2 areas of CVA in the left cerebellum.  Blood cultures ended up growing pneumococcal bacteremia raising concern for bacterial endocarditis. On April 7 patient became hypotensive and reported abdominal pain.  CT of the abdomen pelvis showed concerns for ruptured abdominal aortic aneurysm which was 6.6 cm in size therefore he was emergently taken to the operating room.  Endovascular repair was performed.  In the PACU there is concerns of absent pulses therefore he was taken back to the OR and had bilateral iliofemoral endarterectomy bilateral lower extremity thrombo-embolectomies were performed.Due to concerns for bacterial endocarditis infectious disease recommended continuing patient on Rocephin 2 g every 12 hours until Aug 12, 2017.  Was advised to obtain CBC with differential and BMP weekly and fax it to [phone] and follow-up with infectious disease in 3-4 weeks.  Also follow-up with outpatient vascular surgery.  At this time patient has reached maximum benefit from an hospital stay and stable to be discharged to skilled nursing facility for further rehabilitation.    Assessment & Plan:   Principal Problem:   Ruptured abdominal aortic aneurysm (AAA) (HCC) Active Problems:   Chest pain   CAP (community  acquired pneumonia): Clinical   Acute renal failure superimposed on stage 3 chronic kidney disease (HCC)   Dehydration   Leukocytosis   Elevated troponin   Dizziness   Vertigo   Acute pericarditis: Probable   Bacteremia due to Streptococcus   CVA (cerebral vascular accident) ([place])   LLQ abdominal pain   Hypotension   Hemorrhagic shock (HCC)   Acute blood loss anemia   S/P AAA repair   Sepsis due to Streptococcus pneumoniae ([place])   Acute respiratory failure with hypoxia (HCC)   Ruptured aortic aneurysm status post stent placement, stable Severe peripheral arterial disease with possible emboli status post bilateral iliofemoral endarterectomy and embolectomy - Patient appears to be stable from vascular surgery standpoint -Needs to follow-up outpatient with them at their discretion  Strep pneumo bacteremia Slight leukocytosis, improving - Repeat cultures and workup has remained negative - At this point patient has right upper extremity PICC line in place for which will get IV antibiotics-Rocephin 2g q12hr until Aug 12, 2017 per infectious disease -Will need weekly CBC with differential and BMP - fax at [phone] -PICC line can be pulled out after the antibiotics is completed -Follow-up with outpatient infectious disease in 3-4 weeks  Left lower lobe pneumonia -Suspect strep pneumonia.  Continue Rocephin 2 g IV every 12 hours until Aug 12, 2017  Dizziness and right lower extremity weakness and numbness Acute left cerebellar infarct Intracranial vascular disease -Septic versus secondary to hypotension.  MRI confirms this.  Neurology recommended aspirin 81 mg.  Patient has done physical therapy who recommends patient would benefit from skilled nursing facility therefore arrangements to be made social  worker -Avoid hypotension  Peripheral vascular disease -Continue statin and aspirin  Essential hypertension -Stable  Interstitial lung disease - Incidentally seen on the CAT  scan.  Can follow-up outpatient with pulmonary, if necessary referral can be obtained from primary care physician  DVT prophylaxis: SCDs Code Status: Full code Family Communication: None at bedside Disposition Plan: Skilled nursing facility, awaiting placement  Consultants:   Vascular surgery  Pulmonary  Infectious disease  Neurology    Subjective: No acute events overnight.  Patient is any complaints.  Review of Systems Otherwise negative except as per HPI, including: General: Denies fever, chills, night sweats or unintended weight loss. Resp: Denies cough, wheezing, shortness of breath. Cardiac: Denies chest pain, palpitations, orthopnea, paroxysmal nocturnal dyspnea. GI: Denies abdominal pain, nausea, vomiting, diarrhea or constipation GU: Denies dysuria, frequency, hesitancy or incontinence MS: Denies muscle aches, joint pain or swelling Neuro: Denies headache, neurologic deficits (focal weakness, numbness, tingling), abnormal gait Psych: Denies anxiety, depression, SI/HI/AVH Skin: Denies new rashes or lesions ID: Denies sick contacts, exotic exposures, travel  Objective: Vitals:   07/12/17 0745 07/12/17 1951 07/13/17 0500 07/13/17 0823  BP:  105/70  105/75  Pulse: 76 79  69  Resp: 15   19  Temp: 98.3 F (36.8 C) 98.6 F (37 C)  98.5 F (36.9 C)  TempSrc: Oral Oral  Oral  SpO2: 99% 96%  94%  Weight:   55.6 kg (122 lb 8 oz)   Height:        Intake/Output Summary (Last 24 hours) at 07/13/2017 1050 Last data filed at 07/13/2017 0900 Gross per 24 hour  Intake 360 ml  Output -  Net 360 ml   Filed Weights   07/10/17 0426 07/11/17 0527 07/13/17 0500  Weight: 62.5 kg (137 lb 12.6 oz) 56.9 kg (125 lb 8 oz) 55.6 kg (122 lb 8 oz)    Examination:  General exam: Appears calm and comfortable  Respiratory system: Clear to auscultation. Respiratory effort normal. Cardiovascular system: S1 & S2 heard, RRR. No JVD, murmurs, rubs, gallops or clicks. No pedal  edema. Gastrointestinal system: Abdomen is nondistended, soft and nontender. No organomegaly or masses felt. Normal bowel sounds heard. Central nervous system: Alert and oriented. No focal neurological deficits. Extremities: Symmetric 5 x 5 power.  Surgical scar noted in the groin without any evidence of acute infection or bleeding he has a PICC line in place without any signs of active bleeding or infection Skin: No rashes, lesions or ulcers Psychiatry: Judgement and insight appear normal. Mood & affect appropriate.     Data Reviewed:   CBC: Recent Labs  Lab 07/07/17 0438 07/08/17 0420 07/09/17 0420 07/10/17 0322 07/11/17 0430  WBC 20.0* 20.1* 14.0* 13.7* 12.4*  NEUTROABS  --   --  8.1*  --   --   HGB 11.5* 10.8* 10.0* 9.8* 10.5*  HCT 33.9* 31.6* 30.1* 29.5* 31.1*  MCV 76.2* 76.1* 78.4 76.8* 77.6*  PLT 339 371 428* 472* 338*   Basic Metabolic Panel: Recent Labs  Lab 07/07/17 0438 07/09/17 0420 07/10/17 0322 07/11/17 0430  NA 133* 134* 132* 132*  K 3.7 3.8 3.6 3.4*  CL 102 104 102 103  CO2 22 24 23 23   GLUCOSE 96 97 103* 114*  BUN 16 13 11 13   CREATININE 1.17 1.27* 1.22 1.18  CALCIUM 7.6* 7.9* 7.8* 8.1*   GFR: Estimated Creatinine Clearance: 50.4 mL/min (by C-G formula based on SCr of 1.18 mg/dL). Liver Function Tests: No results for input(s): AST, ALT, ALKPHOS,  BILITOT, PROT, ALBUMIN in the last 168 hours. No results for input(s): LIPASE, AMYLASE in the last 168 hours. No results for input(s): AMMONIA in the last 168 hours. Coagulation Profile: No results for input(s): INR, PROTIME in the last 168 hours. Cardiac Enzymes: Recent Labs  Lab 07/07/17 0438  CKTOTAL 1,014*   BNP (last 3 results) No results for input(s): PROBNP in the last 8760 hours. HbA1C: No results for input(s): HGBA1C in the last 72 hours. CBG: No results for input(s): GLUCAP in the last 168 hours. Lipid Profile: No results for input(s): CHOL, HDL, LDLCALC, TRIG, CHOLHDL, LDLDIRECT in the  last 72 hours. Thyroid Function Tests: No results for input(s): TSH, T4TOTAL, FREET4, T3FREE, THYROIDAB in the last 72 hours. Anemia Panel: No results for input(s): VITAMINB12, FOLATE, FERRITIN, TIBC, IRON, RETICCTPCT in the last 72 hours. Sepsis Labs: No results for input(s): PROCALCITON, LATICACIDVEN in the last 168 hours.  Recent Results (from the past 240 hour(s))  Culture, blood (routine x 2)     Status: None (Preliminary result)   Collection Time: 07/08/17 11:49 AM  Result Value Ref Range Status   Specimen Description BLOOD LEFT ARM  Final   Special Requests   Final    BOTTLES DRAWN AEROBIC AND ANAEROBIC Blood Culture adequate volume   Culture   Final    NO GROWTH 4 DAYS Performed at [facility], [address], [place], [place] [address]    Report Status PENDING  Incomplete  Culture, blood (routine x 2)     Status: None (Preliminary result)   Collection Time: 07/08/17 11:57 AM  Result Value Ref Range Status   Specimen Description BLOOD LEFT HAND  Final   Special Requests   Final    BOTTLES DRAWN AEROBIC AND ANAEROBIC Blood Culture adequate volume   Culture   Final    NO GROWTH 4 DAYS Performed at [facility], [place] [address], [place], [place] [address]    Report Status PENDING  Incomplete  Culture, Urine     Status: None   Collection Time: 07/08/17  1:59 PM  Result Value Ref Range Status   Specimen Description URINE, RANDOM  Final   Special Requests NONE  Final   Culture   Final    NO GROWTH Performed at [place] [facility], [place] [address] [place], [place] [address]    Report Status 07/09/2017 FINAL  Final         Radiology Studies: No results found.      Scheduled Meds: . aspirin EC  81 mg Oral Daily  . atorvastatin  40 mg Oral q1800  . enoxaparin (LOVENOX) injection  30 mg Subcutaneous Q12H  . feeding supplement  1 Container Oral TID BM  . feeding supplement (PRO-STAT SUGAR FREE 64)  30 mL Oral TID BM  . multivitamin with minerals  1  tablet Oral Daily  . pantoprazole  40 mg Oral Q1200  . senna-docusate  2 tablet Oral QHS   Continuous Infusions: . cefTRIAXone (ROCEPHIN)  IV Stopped (07/13/17 0430)     LOS: 15 days    Time spent: 15 mins    Gladies Sofranko Arsenio Loader, MD [facility] Pager [phone]   If 7PM-7AM, please contact night-coverage [email or web address] Password [email or web address] 07/13/2017, 10:50 AM

## 2017-07-13 NOTE — Care Management Note (Addendum)
Case Management  Original Note Created by  Marvetta Gibbons RN, BSN Unit 4E-Case Manager- [place] coverage [phone]  Patient Details  Name: Thomas [name] MRN: [id number] Date of Birth: 08-17-1955  Subjective/Objective:  Pt admitted with weakness, chills, dizziness, cough, left-sided chest pain, abdominal pain, nausea and vomiting to [facility]- tx to [facility] for acute stroke- MRI positive for acute left cerebellar infarcts possibly septic emboli vs. hypotension in the setting of vascular stenosis.  S/p emergent endovascular repair of ruptured AAA, bilateral iliofemoral endarterectomy, BLE thromboembolectomies on 07/01/17- remains on vent post op with plan to extubate 07/02/17                Action/Plan: PTA  Pt lived at home was independent with ADLs and worked as a Presenter, broadcasting. Pt will have family/friends to assist when ready to transition home.  Per MD note most likely will need extended coarse of abx ?iv for discharge- CM to follow for transition of care needs.   Expected Discharge Date:                 Expected Discharge Plan:  OP Rehab  In-House Referral:     Discharge planning Services  CM Consult  Post Acute Care Choice:  Durable Medical Equipment Choice offered to:     DME Arranged:  IV pump/equipment DME Agency:  [place]:    [facility] Agency:     Status of Service:  In process, will continue to follow  If discussed at Long Length of Stay Meetings, dates discussed:    Discharge Disposition:   Additional Comments: 07/13/2017  Update 1600: CM informed that pt has been declined for bed at SNF - per CSW AD and Physician Advisor pt will need to discharge home with IV infusion minus HH (insurance will not pay for [facility]).  CM contacted [facility] and informed that pt will now again need home IV Antibiotic infusion.  [facility] immediately contacted pts daughter: [facility] confirmed that pts niece will administer morning doses and that pts daughter confirms she will administer evening doses of IV  antibiotic.  Per [facility] - IV infusion will be delivered to pts home tomorrow no later than 6pm for tomorrows evening dose.  Pt will need to go to Short Stay at 12 noon Monday 4/22 for PICC line care and labs.  Pt will need to receive am dose here before discharge.  CM informed attending and pt of all information above - attending will modify discharge order.   CM requested CSW follow up with placement - pt remains medically stable for discharge  07/12/17 Discussed in LOS 4/18 - Physician Advisor aiding CSW on placement.  07/10/17 SNF now being recommended for pt - CSW actively working placement  07/09/17 [facility] successfully arranged for to come to Short Stay for RN interventions associated with PICC line and labs.  PT changed recommendation to include the need for 24 hour supervision.  CM informed attending of new recommendation of 24 hour supervision which pt confirms he does not have .  Pt informed of recommendation and informed CM that he is in agreement for SNF if required due to lack of supervision.  CM informed CSW of potential placement need.  CM attempted to contact therapy multiple times to discuss recommendation.  07/06/17 Update 1640:  CM informed that by [facility] that pt actually does not have IV infusion nor HH benefits with current insurance.  [facility] working with pt/family to set pt up on payment plan for infusion,  train family on IV antibiotic administration and arrange Short Stay to see pt once a week for labs and dressing changes (per [facility] pt will not require HHRN for infusion with these arrangements).  Pt will need to go to outpt PT for therapy as HH is not covered.  CM texted paged attending  Update: CM met with pt and daughter at bedside - pt is independent from home alone.  Daughter has agreed to come to pts house daily and help with IV antibiotics and dressing changes.  [facility] to meet with pt and daughter today at 3pm.  Based on the pt needs for [facility] he will need to have HHPT instead of outpt PT -  Attending informed and orders requested.  [facility] aware of HHRN and PT need - referral accepted  Pt now on 2c.  Per I&D; pt will need 6 weeks IV antibiotic Rocephin - per [facility] note they are already following .  Maryclare Labrador, RN 07/13/2017, 4:57 PM

## 2017-07-13 NOTE — Progress Notes (Signed)
   VASCULAR SURGERY ASSESSMENT & PLAN:   12 Days Post-Op s/p: EVAR for ruptured AAA  Palpable pedal pulses.  Steady progress.  SUBJECTIVE:   No complaints.  PHYSICAL EXAM:   Vitals:   07/12/17 0745 07/12/17 1951 07/13/17 0500 07/13/17 0823  BP:  105/70  105/75  Pulse: 76 79  69  Resp: 15   19  Temp: 98.3 F (36.8 C) 98.6 F (37 C)  98.5 F (36.9 C)  TempSrc: Oral Oral  Oral  SpO2: 99% 96%  94%  Weight:   122 lb 8 oz (55.6 kg)   Height:       Palpable pedal pulses. Both groin incisions look fine.  LABS:   Lab Results  Component Value Date   WBC 12.4 (H) 07/11/2017   HGB 10.5 (L) 07/11/2017   HCT 31.1 (L) 07/11/2017   MCV 77.6 (L) 07/11/2017   PLT 570 (H) 07/11/2017   Lab Results  Component Value Date   CREATININE 1.18 07/11/2017   Lab Results  Component Value Date   INR 1.04 07/01/2017   PROBLEM LIST:    Principal Problem:   Ruptured abdominal aortic aneurysm (AAA) (HCC) Active Problems:   Chest pain   CAP (community acquired pneumonia): Clinical   Acute renal failure superimposed on stage 3 chronic kidney disease (HCC)   Dehydration   Leukocytosis   Elevated troponin   Dizziness   Vertigo   Acute pericarditis: Probable   Bacteremia due to Streptococcus   CVA (cerebral vascular accident) ([place])   LLQ abdominal pain   Hypotension   Hemorrhagic shock (HCC)   Acute blood loss anemia   S/P AAA repair   Sepsis due to Streptococcus pneumoniae ([place])   Acute respiratory failure with hypoxia (HCC)   CURRENT MEDS:   . aspirin EC  81 mg Oral Daily  . atorvastatin  40 mg Oral q1800  . enoxaparin (LOVENOX) injection  30 mg Subcutaneous Q12H  . feeding supplement  1 Container Oral TID BM  . feeding supplement (PRO-STAT SUGAR FREE 64)  30 mL Oral TID BM  . multivitamin with minerals  1 tablet Oral Daily  . pantoprazole  40 mg Oral Q1200  . senna-docusate  2 tablet Oral QHS    Deitra Mayo Beeper: [phone] Office:  [phone] 07/13/2017

## 2017-07-13 NOTE — Progress Notes (Signed)
PHARMACY CONSULT NOTE FOR:  OUTPATIENT  PARENTERAL ANTIBIOTIC THERAPY (OPAT)  Indication: Bacteremia Regimen: Rocephin 2 grams iv Q 12 hours End date: Aug 12, 2017  IV antibiotic discharge orders are pended. To discharging provider:  please sign these orders via discharge navigator,  Select New Orders & click on the button choice - Manage This Unsigned Work.     Thank you for allowing pharmacy to be a part of this patient's care.  Tad Moore 07/13/2017, 5:43 PM

## 2017-07-13 NOTE — Clinical Social Work Note (Signed)
CSW Department has verified that [place] has declined. Care management involved to set-up home health.   Kalysta Kneisley Givens, MSW, LCSW Licensed Clinical Social Worker [place] [phone]

## 2017-07-14 LAB — BASIC METABOLIC PANEL
Anion gap: 8 (ref 5–15)
BUN: 22 mg/dL — AB (ref 6–20)
CALCIUM: 8.4 mg/dL — AB (ref 8.9–10.3)
CO2: 24 mmol/L (ref 22–32)
CREATININE: 1.24 mg/dL (ref 0.61–1.24)
Chloride: 101 mmol/L (ref 101–111)
GFR calc Af Amer: >60 mL/min (ref >60)
GLUCOSE: 99 mg/dL (ref 65–99)
POTASSIUM: 3.7 mmol/L (ref 3.5–5.1)
SODIUM: 133 mmol/L — AB (ref 135–145)

## 2017-07-14 LAB — CBC
HEMATOCRIT: 30.7 % — AB (ref 39.0–52.0)
Hemoglobin: 10.2 g/dL — ABNORMAL LOW (ref 13.0–17.0)
MCH: 26.1 pg (ref 26.0–34.0)
MCHC: 33.2 g/dL (ref 30.0–36.0)
MCV: 78.5 fL (ref 78.0–100.0)
PLATELETS: 655 10*3/uL — AB (ref 150–400)
RBC: 3.91 MIL/uL — ABNORMAL LOW (ref 4.22–5.81)
RDW: 15.5 % (ref 11.5–15.5)
WBC: 12.4 10*3/uL — AB (ref 4.0–10.5)

## 2017-07-14 MED ORDER — CEFTRIAXONE IV (FOR PTA / DISCHARGE USE ONLY): 2.0000 g | Freq: Two times a day (BID) | INTRAVENOUS | 0 refills | Status: DC

## 2017-07-14 NOTE — Progress Notes (Signed)
     To Whomever it may concern,   Mr Eugune Sine was hospitalized at [facility] in [place], [place] from 06/28/17 due to medical reason until 07/14/17. Please excuse him from missing work, contact our office if necessary.    Ankit Arsenio Loader, MD [facility]   If 7PM-7AM, please contact night-coverage [email or web address] Password [email or web address] 07/14/2017, 1:17 PM

## 2017-07-14 NOTE — Progress Notes (Signed)
[facility] is prepared for pt DC to home.  I spoke with janette, patient's daughter who received training regarding IV Rocephin IV Push last Friday when we thought pt was going home but did not. She is a CNA and did very well with teaching. Her cousin Neta Mends was also trained. Janette states she and Neta Mends are comfortable and will administer doses as planned. Thania 8 Am and Janette 8 PM each day. Pt has first appt on Monday, 07/16/17 at 12 PM at [facility] for weekly PICC care and labs. Madlyn Frankel is aware. Plan for DC on Saturday.  If patient discharges after hours, please call [phone].   Larry Sierras 07/14/2017, 7:28 AM

## 2017-07-14 NOTE — Discharge Instructions (Signed)
Abdominal Pain, Adult Many things can cause belly (abdominal) pain. Most times, belly pain is not dangerous. Many cases of belly pain can be watched and treated at home. Sometimes belly pain is serious, though. Your doctor will try to find the cause of your belly pain. Follow these instructions at home:  Take over-the-counter and prescription medicines only as told by your doctor. Do not take medicines that help you poop (laxatives) unless told to by your doctor.  Drink enough fluid to keep your pee (urine) clear or pale yellow.  Watch your belly pain for any changes.  Keep all follow-up visits as told by your doctor. This is important. Contact a doctor if:  Your belly pain changes or gets worse.  You are not hungry, or you lose weight without trying.  You are having trouble pooping (constipated) or have watery poop (diarrhea) for more than 2-3 days.  You have pain when you pee or poop.  Your belly pain wakes you up at night.  Your pain gets worse with meals, after eating, or with certain foods.  You are throwing up and cannot keep anything down.  You have a fever. Get help right away if:  Your pain does not go away as soon as your doctor says it should.  You cannot stop throwing up.  Your pain is only in areas of your belly, such as the right side or the left lower part of the belly.  You have bloody or black poop, or poop that looks like tar.  You have very bad pain, cramping, or bloating in your belly.  You have signs of not having enough fluid or water in your body (dehydration), such as: ? Dark pee, very little pee, or no pee. ? Cracked lips. ? Dry mouth. ? Sunken eyes. ? Sleepiness. ? Weakness. This information is not intended to replace advice given to you by your health care provider. Make sure you discuss any questions you have with your health care provider. Document Released: 08/30/2007 Document Revised: 10/01/2015 Document Reviewed: 08/25/2015 Elsevier  Interactive Patient Education  2018 [employer].   Fibrinolytic Therapy Fibrinolytic therapy is the use of medicine to dissolve blood clots. This therapy is used to prevent serious problems caused by blood clots, such as:  Heart attack.  Stroke.  Organ damage.  How does this therapy work? Blood clots can keep blood from flowing to certain areas of the body. Fibrinolytic therapy dissolves blood clots so that blood is able to flow to any areas that the blood clot blocked. Fibrinolytic therapy works best if it is started as soon as possible after a blood clot is found. In people diagnosed with a stroke, it should be started right away. In people with heart attack symptoms, it should be started within 30 minutes after the symptoms started. What are the risks of this therapy? Risks of this therapy include:  Bleeding (hemorrhage). This is the most common risk.  Allergic reaction.  When is this therapy recommended? Your health care provider may recommend fibrinolytic therapy based on:  Your symptoms.  Your test results.  Your medical history.  A physical exam.  This therapy cannot be done in some cases, due to the increased risk of hemorrhage. You may not be able to have fibrinolytic therapy if:  You have had bleeding in your brain.  You have had a stroke within the past 3 months.  You have internal bleeding.  There may be a tear in your aorta (aortic dissection).  You  have had a closed head injury within the past 3 months.  You have very high blood pressure.  You are taking certain medicines that thin the blood.  Fibrinolytic therapy may be an option for you even if:  You have had internal bleeding in the last 2-4 weeks.  You are pregnant.  You have had a heart attack within the past 3 months.  Your health care provider will carefully consider the benefits of fibrinolytic therapy as well as the risk of hemorrhage. When should I seek immediate medical care? Seek  immediate medical care if you have started fibrinolytic therapy and you have any of the following problems:  You vomit.  You cough up blood.  You have blood in your urine or bowel movements.  Your bowel movements look similar to coffee grounds.  Your urine looks pink.  You have a lot of bruising on your skin.  You have a severe headache that does not go away.  This information is not intended to replace advice given to you by your health care provider. Make sure you discuss any questions you have with your health care provider. Document Released: 06/03/2003 Document Revised: 07/28/2015 Document Reviewed: 09/25/2014 Elsevier Interactive Patient Education  2018 [employer].

## 2017-07-14 NOTE — Progress Notes (Signed)
PROGRESS NOTE    Thomas [name]  SKA:[id number] DOB: 1955-05-12 DOA: 06/28/2017 PCP: Patient, No Pcp Per   Brief Narrative:  62 year old with history of tobacco use, CKD stage III, essential hypertension came to the ER on April 4 with complaints of fevers, chills, nonproductive cough and chest pain.  Patient initially had elevated white count of 24 and chest x-ray suggested of interstitial lung disease.  EKG showed ST elevation which was concerning for acute pericarditis per cardiology.  Patient was started on azithromycin and Rocephin as well for pneumonia.  Patient will start on aspirin.  During the admission patient also had an MRI of the brain as he had reported of dizziness which showed 2 areas of CVA in the left cerebellum.  Blood cultures ended up growing pneumococcal bacteremia raising concern for bacterial endocarditis. On April 7 patient became hypotensive and reported abdominal pain.  CT of the abdomen pelvis showed concerns for ruptured abdominal aortic aneurysm which was 6.6 cm in size therefore he was emergently taken to the operating room.  Endovascular repair was performed.  In the PACU there is concerns of absent pulses therefore he was taken back to the OR and had bilateral iliofemoral endarterectomy bilateral lower extremity thrombo-embolectomies were performed.Due to concerns for bacterial endocarditis infectious disease recommended continuing patient on Rocephin 2 g every 12 hours until Aug 12, 2017.  Was advised to obtain CBC with differential and BMP weekly and fax it to [phone] and follow-up with infectious disease in 3-4 weeks.  Also follow-up with outpatient vascular surgery.  Patient has been denied by SNF, now will be going home and admisniter meds on his own with the help of his sister. Education has been provided by the [facility].   No acute events overnight. Stable to go home with IV ABx.     Assessment & Plan:   Principal Problem:   Ruptured abdominal aortic  aneurysm (AAA) (HCC) Active Problems:   Chest pain   CAP (community acquired pneumonia): Clinical   Acute renal failure superimposed on stage 3 chronic kidney disease (HCC)   Dehydration   Leukocytosis   Elevated troponin   Dizziness   Vertigo   Acute pericarditis: Probable   Bacteremia due to Streptococcus   CVA (cerebral vascular accident) ([place])   LLQ abdominal pain   Hypotension   Hemorrhagic shock (HCC)   Acute blood loss anemia   S/P AAA repair   Sepsis due to Streptococcus pneumoniae ([place])   Acute respiratory failure with hypoxia (HCC)   Ruptured aortic aneurysm status post stent placement, stable Severe peripheral arterial disease with possible emboli status post bilateral iliofemoral endarterectomy and embolectomy; stable - Patient appears to be stable from vascular surgery standpoint -Needs to follow-up outpatient with them at their discretion  Strep pneumo bacteremia Slight leukocytosis, improving - Repeat cultures and workup has remained negative - At this point patient has right upper extremity PICC line in place for which will get IV antibiotics-Rocephin 2g q12hr until Aug 12, 2017 per infectious disease -Will need weekly CBC with differential and BMP - fax at [phone]- Dr Tommy Medal.  -PICC line can be pulled out after the antibiotics is completed -Follow-up with outpatient infectious disease in 3-4 weeks- Dr Tommy Medal  Left lower lobe pneumonia -Suspect strep pneumonia.  Continue Rocephin 2 g IV every 12 hours until Aug 12, 2017  Dizziness and right lower extremity weakness and numbness; improved.  Acute left cerebellar infarct Intracranial vascular disease -Septic versus secondary to  hypotension.  MRI confirms this.  Neurology recommended aspirin 81 mg.  Patient has done physical therapy who recommends patient would benefit from skilled nursing facility but unable to go as he is denied. Will have to go home. Can go to outpatient PT.  -Avoid  hypotension  Peripheral vascular disease -Continue statin and aspirin  Essential hypertension -Stable  Interstitial lung disease - Incidentally seen on the CAT scan.  Can follow-up outpatient with pulmonary, if necessary referral can be obtained from primary care physician  DVT prophylaxis: SCDs Code Status: Full code Family Communication: None at bedside Disposition Plan: Skilled nursing facility, awaiting placement  Consultants:   Vascular surgery  Pulmonary  Infectious disease  Neurology    Subjective: No complaints, no acute events overnight. Tolerating food and feeling well.   Review of Systems Otherwise negative except as per HPI, including: HEENT/EYES = negative for pain, redness, loss of vision, double vision, blurred vision, loss of hearing, sore throat, hoarseness, dysphagia Cardiovascular= negative for chest pain, palpitation, murmurs, lower extremity swelling Respiratory/lungs= negative for shortness of breath, cough, hemoptysis, wheezing, mucus production Gastrointestinal= negative for nausea, vomiting,, abdominal pain, melena, hematemesis Genitourinary= negative for Dysuria, Hematuria, Change in Urinary Frequency MSK = Negative for arthralgia, myalgias, Back Pain, Joint swelling  Neurology= Negative for headache, seizures, numbness, tingling  Psychiatry= Negative for anxiety, depression, suicidal and homocidal ideation Allergy/Immunology= Medication/Food allergy as listed  Skin= Negative for Rash, lesions, ulcers, itching  Objective: Vitals:   07/13/17 2316 07/14/17 0403 07/14/17 0500 07/14/17 0830  BP: 111/74 115/83  116/84  Pulse: 85 87  84  Resp: 18 17  19   Temp: 99.1 F (37.3 C) 99.2 F (37.3 C)  99 F (37.2 C)  TempSrc: Oral Oral  Oral  SpO2: 93% 94%  95%  Weight:   55.3 kg (121 lb 14.4 oz)   Height:        Intake/Output Summary (Last 24 hours) at 07/14/2017 0951 Last data filed at 07/14/2017 0900 Gross per 24 hour  Intake 360 ml   Output 275 ml  Net 85 ml   Filed Weights   07/11/17 0527 07/13/17 0500 07/14/17 0500  Weight: 56.9 kg (125 lb 8 oz) 55.6 kg (122 lb 8 oz) 55.3 kg (121 lb 14.4 oz)    Examination:  General exam: Appears calm and comfortable  Respiratory system: Clear to auscultation. Respiratory effort normal. Cardiovascular system: S1 & S2 heard, RRR. No JVD, murmurs, rubs, gallops or clicks. No pedal edema. Gastrointestinal system: Abdomen is nondistended, soft and nontender. No organomegaly or masses felt. Normal bowel sounds heard. Central nervous system: Alert and oriented. No focal neurological deficits. Extremities: Symmetric 5 x 5 power.  Surgical scar in the right groin. PICC line in place no signs of infection or active bleeding.  Skin: No rashes, lesions or ulcers Psychiatry: Judgement and insight appear normal. Mood & affect appropriate.     Data Reviewed:   CBC: Recent Labs  Lab 07/08/17 0420 07/09/17 0420 07/10/17 0322 07/11/17 0430 07/14/17 0653  WBC 20.1* 14.0* 13.7* 12.4* 12.4*  NEUTROABS  --  8.1*  --   --   --   HGB 10.8* 10.0* 9.8* 10.5* 10.2*  HCT 31.6* 30.1* 29.5* 31.1* 30.7*  MCV 76.1* 78.4 76.8* 77.6* 78.5  PLT 371 428* 472* 570* 269*   Basic Metabolic Panel: Recent Labs  Lab 07/09/17 0420 07/10/17 0322 07/11/17 0430 07/14/17 0653  NA 134* 132* 132* 133*  K 3.8 3.6 3.4* 3.7  CL 104 102 103  101  CO2 24 23 23 24   GLUCOSE 97 103* 114* 99  BUN 13 11 13  22*  CREATININE 1.27* 1.22 1.18 1.24  CALCIUM 7.9* 7.8* 8.1* 8.4*   GFR: Estimated Creatinine Clearance: 47.7 mL/min (by C-G formula based on SCr of 1.24 mg/dL). Liver Function Tests: No results for input(s): AST, ALT, ALKPHOS, BILITOT, PROT, ALBUMIN in the last 168 hours. No results for input(s): LIPASE, AMYLASE in the last 168 hours. No results for input(s): AMMONIA in the last 168 hours. Coagulation Profile: No results for input(s): INR, PROTIME in the last 168 hours. Cardiac Enzymes: No results for  input(s): CKTOTAL, CKMB, CKMBINDEX, TROPONINI in the last 168 hours. BNP (last 3 results) No results for input(s): PROBNP in the last 8760 hours. HbA1C: No results for input(s): HGBA1C in the last 72 hours. CBG: No results for input(s): GLUCAP in the last 168 hours. Lipid Profile: No results for input(s): CHOL, HDL, LDLCALC, TRIG, CHOLHDL, LDLDIRECT in the last 72 hours. Thyroid Function Tests: No results for input(s): TSH, T4TOTAL, FREET4, T3FREE, THYROIDAB in the last 72 hours. Anemia Panel: No results for input(s): VITAMINB12, FOLATE, FERRITIN, TIBC, IRON, RETICCTPCT in the last 72 hours. Sepsis Labs: No results for input(s): PROCALCITON, LATICACIDVEN in the last 168 hours.  Recent Results (from the past 240 hour(s))  Culture, blood (routine x 2)     Status: None   Collection Time: 07/08/17 11:49 AM  Result Value Ref Range Status   Specimen Description BLOOD LEFT ARM  Final   Special Requests   Final    BOTTLES DRAWN AEROBIC AND ANAEROBIC Blood Culture adequate volume   Culture   Final    NO GROWTH 5 DAYS Performed at [facility], [address], [place], [place] [address]    Report Status 07/13/2017 FINAL  Final  Culture, blood (routine x 2)     Status: None   Collection Time: 07/08/17 11:57 AM  Result Value Ref Range Status   Specimen Description BLOOD LEFT HAND  Final   Special Requests   Final    BOTTLES DRAWN AEROBIC AND ANAEROBIC Blood Culture adequate volume   Culture   Final    NO GROWTH 5 DAYS Performed at [facility], [place] [address], [place], [place] [address]    Report Status 07/13/2017 FINAL  Final  Culture, Urine     Status: None   Collection Time: 07/08/17  1:59 PM  Result Value Ref Range Status   Specimen Description URINE, RANDOM  Final   Special Requests NONE  Final   Culture   Final    NO GROWTH Performed at [facility], [place] [address], [place],  [address]    Report Status 07/09/2017 FINAL  Final         Radiology  Studies: No results found.      Scheduled Meds: . aspirin EC  81 mg Oral Daily  . atorvastatin  40 mg Oral q1800  . enoxaparin (LOVENOX) injection  30 mg Subcutaneous Q12H  . feeding supplement  1 Container Oral TID BM  . feeding supplement (PRO-STAT SUGAR FREE 64)  30 mL Oral TID BM  . multivitamin with minerals  1 tablet Oral Daily  . pantoprazole  40 mg Oral Q1200  . senna-docusate  2 tablet Oral QHS   Continuous Infusions: . cefTRIAXone (ROCEPHIN)  IV Stopped (07/14/17 0451)     LOS: 16 days    Time spent: 20 mins    Roselyne Stalnaker Chirag Regenia Erck,  MD [facility]   If 7PM-7AM, please contact night-coverage [email or web address] Password [facility] 07/14/2017, 9:51 AM

## 2017-07-14 NOTE — Progress Notes (Signed)
Rocephin completed. Pt educated on d/c instructions; reminded to collect all belongings; PICC dressing still C/D/I; explained pt must be wheeled out once ready. MD will sign prescription soon. Will continue to monitor.   [place]  Phyillis Dascoli, RN

## 2017-07-14 NOTE — Progress Notes (Signed)
Pharm notified of need for rocephin to be rescheduled (for now) in order for pt to go home today. Will continue to prepare pt for d/c.   [place]  Sharona Rovner, RN

## 2017-07-14 NOTE — Progress Notes (Signed)
Pt/Family given signed prescriptions & work note. Wheeled out to vehicle.   [place]  Sativa Gelles, RN

## 2017-07-16 ENCOUNTER — Other Ambulatory Visit: Payer: Self-pay

## 2017-07-16 ENCOUNTER — Encounter (HOSPITAL_COMMUNITY)
Admission: RE | Admit: 2017-07-16 | Discharge: 2017-07-16 | Disposition: A | Discharge status: Home / Self Care | Payer: Private Health Insurance - Indemnity | Source: Ambulatory Visit | Attending: Internal Medicine | Admitting: Internal Medicine

## 2017-07-16 DIAGNOSIS — Z4800 Encounter for change or removal of nonsurgical wound dressing: Secondary | ICD-10-CM | POA: Insufficient documentation

## 2017-07-16 DIAGNOSIS — Z8679 Personal history of other diseases of the circulatory system: Secondary | ICD-10-CM

## 2017-07-16 DIAGNOSIS — B9689 Other specified bacterial agents as the cause of diseases classified elsewhere: Secondary | ICD-10-CM | POA: Diagnosis not present

## 2017-07-16 DIAGNOSIS — R7881 Bacteremia: Secondary | ICD-10-CM | POA: Insufficient documentation

## 2017-07-16 DIAGNOSIS — Z9889 Other specified postprocedural states: Principal | ICD-10-CM

## 2017-07-16 LAB — SEDIMENTATION RATE: SED RATE: 121 mm/h — AB (ref 0–16)

## 2017-07-16 LAB — BASIC METABOLIC PANEL
Anion gap: 9 (ref 5–15)
BUN: 16 mg/dL (ref 6–20)
CALCIUM: 8.8 mg/dL — AB (ref 8.9–10.3)
CO2: 24 mmol/L (ref 22–32)
CREATININE: 1.3 mg/dL — AB (ref 0.61–1.24)
Chloride: 99 mmol/L — ABNORMAL LOW (ref 101–111)
GFR calc Af Amer: >60 mL/min (ref >60)
GFR, EST NON AFRICAN AMERICAN: 57 mL/min — AB (ref >60)
Glucose, Bld: 129 mg/dL — ABNORMAL HIGH (ref 65–99)
Potassium: 3.8 mmol/L (ref 3.5–5.1)
Sodium: 132 mmol/L — ABNORMAL LOW (ref 135–145)

## 2017-07-16 LAB — CBC WITH DIFFERENTIAL/PLATELET
Basophils Absolute: 0.1 10*3/uL (ref 0.0–0.1)
Basophils Relative: 1 %
EOS PCT: 6 %
Eosinophils Absolute: 0.5 10*3/uL (ref 0.0–0.7)
HCT: 32.5 % — ABNORMAL LOW (ref 39.0–52.0)
Hemoglobin: 10.7 g/dL — ABNORMAL LOW (ref 13.0–17.0)
LYMPHS ABS: 2.4 10*3/uL (ref 0.7–4.0)
Lymphocytes Relative: 25 %
MCH: 25.9 pg — AB (ref 26.0–34.0)
MCHC: 32.9 g/dL (ref 30.0–36.0)
MCV: 78.7 fL (ref 78.0–100.0)
MONOS PCT: 5 %
Monocytes Absolute: 0.5 10*3/uL (ref 0.1–1.0)
Neutro Abs: 6.2 10*3/uL (ref 1.7–7.7)
Neutrophils Relative %: 63 %
PLATELETS: 612 10*3/uL — AB (ref 150–400)
RBC: 4.13 MIL/uL — ABNORMAL LOW (ref 4.22–5.81)
RDW: 15.1 % (ref 11.5–15.5)
WBC: 9.7 10*3/uL (ref 4.0–10.5)

## 2017-07-16 LAB — C-REACTIVE PROTEIN: CRP: 12.9 mg/dL — ABNORMAL HIGH (ref <1.0)

## 2017-07-16 MED ORDER — HEPARIN SOD (PORK) LOCK FLUSH 100 UNIT/ML IV SOLN
INTRAVENOUS | Status: AC
  Administered 2017-07-16: 250 [IU]
  Filled 2017-07-16: qty 5

## 2017-07-16 MED ORDER — HEPARIN SOD (PORK) LOCK FLUSH 100 UNIT/ML IV SOLN
250.0000 [IU] | INTRAVENOUS | Status: AC | PRN
  Administered 2017-07-16: 250 [IU]

## 2017-07-23 ENCOUNTER — Ambulatory Visit (HOSPITAL_COMMUNITY)
Admission: RE | Admit: 2017-07-23 | Discharge: 2017-07-23 | Disposition: A | Discharge status: Home / Self Care | Payer: Private Health Insurance - Indemnity | Source: Ambulatory Visit | Attending: Internal Medicine | Admitting: Internal Medicine

## 2017-07-23 DIAGNOSIS — R7881 Bacteremia: Secondary | ICD-10-CM | POA: Diagnosis present

## 2017-07-23 DIAGNOSIS — Z452 Encounter for adjustment and management of vascular access device: Secondary | ICD-10-CM | POA: Insufficient documentation

## 2017-07-23 LAB — CBC WITH DIFFERENTIAL/PLATELET
Basophils Absolute: 0.1 10*3/uL (ref 0.0–0.1)
Basophils Relative: 1 %
EOS ABS: 2.1 10*3/uL — AB (ref 0.0–0.7)
EOS PCT: 19 %
HCT: 33.7 % — ABNORMAL LOW (ref 39.0–52.0)
HEMOGLOBIN: 11.1 g/dL — AB (ref 13.0–17.0)
LYMPHS ABS: 2.8 10*3/uL (ref 0.7–4.0)
Lymphocytes Relative: 25 %
MCH: 26.1 pg (ref 26.0–34.0)
MCHC: 32.9 g/dL (ref 30.0–36.0)
MCV: 79.3 fL (ref 78.0–100.0)
MONO ABS: 0.4 10*3/uL (ref 0.1–1.0)
MONOS PCT: 4 %
NEUTROS PCT: 51 %
Neutro Abs: 5.5 10*3/uL (ref 1.7–7.7)
Platelets: 481 10*3/uL — ABNORMAL HIGH (ref 150–400)
RBC: 4.25 MIL/uL (ref 4.22–5.81)
RDW: 15.5 % (ref 11.5–15.5)
WBC: 11 10*3/uL — ABNORMAL HIGH (ref 4.0–10.5)

## 2017-07-23 LAB — BASIC METABOLIC PANEL
Anion gap: 8 (ref 5–15)
BUN: 15 mg/dL (ref 6–20)
CHLORIDE: 100 mmol/L — AB (ref 101–111)
CO2: 28 mmol/L (ref 22–32)
CREATININE: 1.32 mg/dL — AB (ref 0.61–1.24)
Calcium: 9.1 mg/dL (ref 8.9–10.3)
GFR calc Af Amer: >60 mL/min (ref >60)
GFR calc non Af Amer: 56 mL/min — ABNORMAL LOW (ref >60)
Glucose, Bld: 96 mg/dL (ref 65–99)
Potassium: 3.9 mmol/L (ref 3.5–5.1)
Sodium: 136 mmol/L (ref 135–145)

## 2017-07-23 MED ORDER — HEPARIN SOD (PORK) LOCK FLUSH 100 UNIT/ML IV SOLN
250.0000 [IU] | INTRAVENOUS | Status: DC | PRN
  Administered 2017-07-23: 250 [IU]

## 2017-07-23 MED ORDER — HEPARIN SOD (PORK) LOCK FLUSH 100 UNIT/ML IV SOLN
INTRAVENOUS | Status: AC
  Administered 2017-07-23: 250 [IU]
  Filled 2017-07-23: qty 5

## 2017-07-30 ENCOUNTER — Ambulatory Visit (HOSPITAL_COMMUNITY)
Admission: RE | Admit: 2017-07-30 | Discharge: 2017-07-30 | Disposition: A | Discharge status: Home / Self Care | Payer: Private Health Insurance - Indemnity | Source: Ambulatory Visit | Attending: Internal Medicine | Admitting: Internal Medicine

## 2017-07-30 DIAGNOSIS — Z9889 Other specified postprocedural states: Secondary | ICD-10-CM | POA: Insufficient documentation

## 2017-07-30 DIAGNOSIS — Z8679 Personal history of other diseases of the circulatory system: Secondary | ICD-10-CM | POA: Insufficient documentation

## 2017-07-30 LAB — BASIC METABOLIC PANEL
ANION GAP: 8 (ref 5–15)
BUN: 18 mg/dL (ref 6–20)
CO2: 29 mmol/L (ref 22–32)
Calcium: 9 mg/dL (ref 8.9–10.3)
Chloride: 101 mmol/L (ref 101–111)
Creatinine, Ser: 1.32 mg/dL — ABNORMAL HIGH (ref 0.61–1.24)
GFR, EST NON AFRICAN AMERICAN: 56 mL/min — AB (ref >60)
Glucose, Bld: 82 mg/dL (ref 65–99)
Potassium: 4 mmol/L (ref 3.5–5.1)
Sodium: 138 mmol/L (ref 135–145)

## 2017-07-30 LAB — CBC WITH DIFFERENTIAL/PLATELET
BASOS ABS: 0.1 10*3/uL (ref 0.0–0.1)
BASOS PCT: 1 %
Eosinophils Absolute: 1.6 10*3/uL — ABNORMAL HIGH (ref 0.0–0.7)
Eosinophils Relative: 15 %
HEMATOCRIT: 34.9 % — AB (ref 39.0–52.0)
HEMOGLOBIN: 11.4 g/dL — AB (ref 13.0–17.0)
Lymphocytes Relative: 32 %
Lymphs Abs: 3.3 10*3/uL (ref 0.7–4.0)
MCH: 26 pg (ref 26.0–34.0)
MCHC: 32.7 g/dL (ref 30.0–36.0)
MCV: 79.7 fL (ref 78.0–100.0)
MONOS PCT: 6 %
Monocytes Absolute: 0.6 10*3/uL (ref 0.1–1.0)
NEUTROS ABS: 4.7 10*3/uL (ref 1.7–7.7)
NEUTROS PCT: 46 %
Platelets: 429 10*3/uL — ABNORMAL HIGH (ref 150–400)
RBC: 4.38 MIL/uL (ref 4.22–5.81)
RDW: 15.7 % — ABNORMAL HIGH (ref 11.5–15.5)
WBC: 10.4 10*3/uL (ref 4.0–10.5)

## 2017-07-30 MED ORDER — HEPARIN SOD (PORK) LOCK FLUSH 100 UNIT/ML IV SOLN: 250.0000 [IU] | INTRAVENOUS | Status: DC | PRN

## 2017-07-30 MED ORDER — HEPARIN SOD (PORK) LOCK FLUSH 100 UNIT/ML IV SOLN
INTRAVENOUS | Status: AC
  Administered 2017-07-30: 250 [IU]
  Filled 2017-07-30: qty 5

## 2017-07-31 ENCOUNTER — Telehealth: Payer: Self-pay | Admitting: Surgery

## 2017-07-31 ENCOUNTER — Other Ambulatory Visit: Payer: Self-pay | Admitting: Surgery

## 2017-07-31 NOTE — Telephone Encounter (Signed)
-----   Message from Mena Goes, RN sent at 07/12/2017  4:52 PM EDT ----- Regarding: 1 month   ----- Message ----- From: Serafina Mitchell, MD Sent: 07/12/2017   8:19 AM To: [facility] Charge Pool  F/u 1 month with CTA abd/pelvis

## 2017-07-31 NOTE — Telephone Encounter (Signed)
Sched appts 08/13/17; CTA at 2:30 at [place], and MD at 3:45.

## 2017-07-31 NOTE — Telephone Encounter (Signed)
Sched appts 08/13/17; CTA at 2:30 at [place] and MD at 3:45. Lm on daughter's # to inform them of appts and cta instructions

## 2017-08-03 NOTE — Progress Notes (Deleted)
Subjective:    Patient ID: Thomas [name], male    DOB: [birth year], 62 y.o.   MRN: [id number]  No chief complaint on file.   HPI:  Thomas [name] is a 62 y.o. male with a previous medical history of CKD III and hypertension who presents today for initial office visit following hospitalization for ruptured abdominal aortic aneurysm and Streptococcus bacteremia.   Thomas [name] was evaluated in the emergency room and admitted to the hospital via EMS with cold symptoms including fever, chills, and nonradiating chest discomfort.  He was noted to have leukocytosis at 24.1 and blood cultures positive for Streptococcus Pneumonia. He also complained of dizziness and found to have 3 acute non-hemorrhagic small left cerebellar infarcts. Cardiology with concerns for infectious pericarditis He was treated with CNS dosed ceftriaxone. Repeat blood cultures on 06/30/17 showed clearing of bacteremia with administration of ceftriaxone. He was discharged with PICC line to receive ceftriaxone with end date of 08/12/17. Hospitalization was complicated with ruptured aortic aneurysm requiring endovascular repair. All hospital notes, labs and imaging were reviewed in detail.       No Known Allergies    Outpatient Medications Prior to Visit  Medication Sig Dispense Refill  . aspirin EC 81 MG EC tablet Take 1 tablet (81 mg total) by mouth daily. 30 tablet 0  . atorvastatin (LIPITOR) 40 MG tablet Take 1 tablet (40 mg total) by mouth daily at 6 PM. 30 tablet 0  . cefTRIAXone (ROCEPHIN) IVPB Inject 2 g into the vein every 12 (twelve) hours. Indication:  Pneumococcal bacteremia and possible endovascular infection with CNS emboli Last Day of Therapy:  08/12/17 Labs - Once weekly:  CBC/D and BMP, Labs - Every other week:  ESR and CRP 76 Units 0  . cefTRIAXone (ROCEPHIN) IVPB Inject 2 g into the vein every 12 (twelve) hours. Indication:  Bacteremia Last Day of Therapy:  May  19 Labs - Once weekly:  CBC/D and BMP, Labs - Every other  week:  ESR and CRP 60 Units 0  . cefTRIAXone 2 g in sodium chloride 0.9 % 100 mL Inject 2 g into the vein every 12 (twelve) hours. 1 Dose 0  . cyclobenzaprine (FLEXERIL) 10 MG tablet Take 1 tablet (10 mg total) by mouth 2 (two) times daily as needed for muscle spasms. (Patient not taking: Reported on 06/28/2017) 20 tablet 0  . pantoprazole (PROTONIX) 40 MG tablet Take 1 tablet (40 mg total) by mouth daily at 12 noon. 30 tablet 0  . senna-docusate (SENOKOT-S) 8.6-50 MG tablet Take 2 tablets by mouth at bedtime. 30 tablet 0   No facility-administered medications prior to visit.      Past Medical History:  Diagnosis Date  . Hypertension       Past Surgical History:  Procedure Laterality Date  . ENDOVASCULAR STENT INSERTION N/A 07/01/2017   Procedure: ENDOVASCULAR STENT GRAFT INSERTION;  Surgeon: Serafina Mitchell, MD;  Location[facility];  Service: Vascular;  Laterality: N/A;  . THROMBECTOMY FEMORAL ARTERY Bilateral 07/01/2017   Procedure: Bilateral IlioFemoral Thrombectomies;  Surgeon: Serafina Mitchell, MD;  Location: [facility] OR;  Service: Vascular;  Laterality: Bilateral;      No family history on file.    Social History   Socioeconomic History  . Marital status: Divorced    Spouse name: Not on file  . Number of children: Not on file  . Years of education: Not on file  . Highest education level: Not on file  Occupational History  . Not  on file  Social Needs  . Financial resource strain: Not on file  . Food insecurity:    Worry: Not on file    Inability: Not on file  . Transportation needs:    Medical: Not on file    Non-medical: Not on file  Tobacco Use  . Smoking status: Current Every Day Smoker    Types: Cigarettes  . Smokeless tobacco: Never Used  Substance and Sexual Activity  . Alcohol use: No  . Drug use: No  . Sexual activity: Not on file  Lifestyle  . Physical activity:    Days per week: Not on file    Minutes per session: Not on file  . Stress: Not on file    Relationships  . Social connections:    Talks on phone: Not on file    Gets together: Not on file    Attends religious service: Not on file    Active member of club or organization: Not on file    Attends meetings of clubs or organizations: Not on file    Relationship status: Not on file  . Intimate partner violence:    Fear of current or ex partner: Not on file    Emotionally abused: Not on file    Physically abused: Not on file    Forced sexual activity: Not on file  Other Topics Concern  . Not on file  Social History Narrative  . Not on file      Review of Systems     Objective:    There were no vitals taken for this visit. Nursing note and vital signs reviewed.  Physical Exam      Assessment & Plan:   Problem List Items Addressed This Visit    None       I am having Thomas [name] maintain his cyclobenzaprine, cefTRIAXone, aspirin, cefTRIAXone 2 g in sodium chloride 0.9 % 100 mL, atorvastatin, pantoprazole, senna-docusate, and cefTRIAXone.   No orders of the defined types were placed in this encounter.    Follow-up: No follow-ups on file.  Mauricio Po, [place] [facility]

## 2017-08-06 ENCOUNTER — Inpatient Hospital Stay: Payer: Private Health Insurance - Indemnity | Admitting: Family

## 2017-08-06 ENCOUNTER — Encounter (HOSPITAL_COMMUNITY)
Admission: RE | Admit: 2017-08-06 | Discharge: 2017-08-06 | Disposition: A | Discharge status: Home / Self Care | Payer: Private Health Insurance - Indemnity | Source: Ambulatory Visit | Attending: Internal Medicine | Admitting: Internal Medicine

## 2017-08-06 DIAGNOSIS — R7881 Bacteremia: Secondary | ICD-10-CM | POA: Diagnosis not present

## 2017-08-06 DIAGNOSIS — Z9889 Other specified postprocedural states: Secondary | ICD-10-CM | POA: Insufficient documentation

## 2017-08-06 DIAGNOSIS — J189 Pneumonia, unspecified organism: Secondary | ICD-10-CM | POA: Diagnosis not present

## 2017-08-06 LAB — C-REACTIVE PROTEIN: CRP: 2.4 mg/dL — ABNORMAL HIGH (ref <1.0)

## 2017-08-06 LAB — CBC WITH DIFFERENTIAL/PLATELET
Basophils Absolute: 0.1 10*3/uL (ref 0.0–0.1)
Basophils Relative: 1 %
EOS PCT: 17 %
Eosinophils Absolute: 1.8 10*3/uL — ABNORMAL HIGH (ref 0.0–0.7)
HCT: 37.8 % — ABNORMAL LOW (ref 39.0–52.0)
Hemoglobin: 12.3 g/dL — ABNORMAL LOW (ref 13.0–17.0)
LYMPHS ABS: 3.2 10*3/uL (ref 0.7–4.0)
LYMPHS PCT: 30 %
MCH: 26 pg (ref 26.0–34.0)
MCHC: 32.5 g/dL (ref 30.0–36.0)
MCV: 79.9 fL (ref 78.0–100.0)
MONO ABS: 0.6 10*3/uL (ref 0.1–1.0)
Monocytes Relative: 6 %
Neutro Abs: 4.9 10*3/uL (ref 1.7–7.7)
Neutrophils Relative %: 46 %
PLATELETS: 364 10*3/uL (ref 150–400)
RBC: 4.73 MIL/uL (ref 4.22–5.81)
RDW: 16.1 % — AB (ref 11.5–15.5)
WBC: 10.4 10*3/uL (ref 4.0–10.5)

## 2017-08-06 LAB — SEDIMENTATION RATE: Sed Rate: 64 mm/hr — ABNORMAL HIGH (ref 0–16)

## 2017-08-06 LAB — BASIC METABOLIC PANEL
Anion gap: 8 (ref 5–15)
BUN: 19 mg/dL (ref 6–20)
CHLORIDE: 103 mmol/L (ref 101–111)
CO2: 27 mmol/L (ref 22–32)
Calcium: 9.4 mg/dL (ref 8.9–10.3)
Creatinine, Ser: 1.49 mg/dL — ABNORMAL HIGH (ref 0.61–1.24)
GFR calc Af Amer: 56 mL/min — ABNORMAL LOW (ref >60)
GFR calc non Af Amer: 48 mL/min — ABNORMAL LOW (ref >60)
GLUCOSE: 136 mg/dL — AB (ref 65–99)
POTASSIUM: 3.9 mmol/L (ref 3.5–5.1)
Sodium: 138 mmol/L (ref 135–145)

## 2017-08-06 MED ORDER — HEPARIN SOD (PORK) LOCK FLUSH 100 UNIT/ML IV SOLN
INTRAVENOUS | Status: AC
  Administered 2017-08-06: 250 [IU]
  Filled 2017-08-06: qty 5

## 2017-08-06 MED ORDER — HEPARIN SOD (PORK) LOCK FLUSH 100 UNIT/ML IV SOLN
250.0000 [IU] | INTRAVENOUS | Status: AC | PRN
  Administered 2017-08-06: 250 [IU]

## 2017-08-08 ENCOUNTER — Encounter: Payer: Self-pay | Admitting: Neurology

## 2017-08-08 ENCOUNTER — Ambulatory Visit (INDEPENDENT_AMBULATORY_CARE_PROVIDER_SITE_OTHER): Payer: Medicare HMO | Admitting: Neurology

## 2017-08-08 VITALS — BP 150/92 | HR 70 | Ht 67.0 in | Wt 125.2 lb

## 2017-08-08 DIAGNOSIS — I713 Abdominal aortic aneurysm, ruptured, unspecified: Secondary | ICD-10-CM

## 2017-08-08 DIAGNOSIS — Z9889 Other specified postprocedural states: Secondary | ICD-10-CM

## 2017-08-08 DIAGNOSIS — R7881 Bacteremia: Secondary | ICD-10-CM | POA: Diagnosis not present

## 2017-08-08 DIAGNOSIS — H8102 Meniere's disease, left ear: Secondary | ICD-10-CM | POA: Diagnosis not present

## 2017-08-08 DIAGNOSIS — Z8679 Personal history of other diseases of the circulatory system: Secondary | ICD-10-CM | POA: Diagnosis not present

## 2017-08-08 DIAGNOSIS — B955 Unspecified streptococcus as the cause of diseases classified elsewhere: Secondary | ICD-10-CM | POA: Diagnosis not present

## 2017-08-08 DIAGNOSIS — I63342 Cerebral infarction due to thrombosis of left cerebellar artery: Secondary | ICD-10-CM | POA: Diagnosis not present

## 2017-08-08 NOTE — Progress Notes (Signed)
STROKE NEUROLOGY FOLLOW UP NOTE  NAME: [name] DOB: 1956/02/17  REASON FOR VISIT: stroke follow up HISTORY FROM: pt and chart  Today we had the pleasure of seeing Thomas [name] in follow-up at our [facility]rology [facility]. Pt was accompanied by daughter.   History Summary Thomas [name] is a 62 y.o. male with history of hypertension, history of tobacco use and previous strokes by imaging presenting with weakness, chills, dizziness, cough, left-sided chest pain, abdominal pain, nausea and vomiting.   CTA aortic artery showed ruptured AAA. S/p emergent endovascular repair of ruptured AAA, bilateral iliofemoral endarterectomy, and BLE thrumboembolectomies.  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  Symptoms consistent with PVD, no further surgery performed at that time.  Patient numbness and weakness on the right LE gradually improved over time.  Due to dizziness, chills, nausea vomiting, patient had MRI with and without [name] showed 3 acute small left cerebellar infarcts, old small bilateral cerebellar infarcts as well as old BG and thalamus infarcts.  MRA head and neck revealed right M1 chronic occlusion, bilateral PCA stenosis in the terminal right VA stenosis, no mycotic aneurysm.  EF 60 to 65%.  LDL 72 and A1c 5.6.  Initially plan for TEE to rule out endocarditis, however, patient deemed high risk, then started empiric treatment of Rocephin for 6 weeks.  After anemia stabilized, patient started on aspirin and Lipitor.  Discharging stable condition, recommend quit smoking.  Interval History During the interval time, the patient has been doing well from a stroke standpoint. Patient right LE still has intermittent numbness, but brief and mild.  He still has left lower quadrant abdominal pain likely due to left retroperitoneal hematoma.  However, repeat CT abdomen pelvis on  07/08/2017 showed decreasing size of hematoma.  He has appointment with [facility] on 08/13/2017 for repeat CT angios abdomen and pelvis.  He is going to finish 6 weeks of antibiotics this Sunday.  BP today 150/92 in clinic.  At this appointment, patient stated that he had intermittent vertigo episodes since 2014.  It happened suddenly, most time while he was in sitting position, either sitting a desk or driving in a car.  Room spinning sensation, lasting 1 to 2 hours with nausea vomiting.  No headache, or weakness numbness.  Also has left ear ringing initially, but then about 3 to 4 years ago left ear severe hearing loss.  Patient had ENT referral several years ago, recommend MRI, however patient has no insurance at that time and he did not follow-up after.  REVIEW OF SYSTEMS: Full 14 system review of systems performed and notable only for those listed below and in HPI above, all others are negative:  Constitutional: Fever/chills, weight loss Cardiovascular:  Ear/Nose/Throat:   Skin:  Eyes:   Respiratory:   Gastroitestinal:   Genitourinary:  Hematology/Lymphatic:   Endocrine: Feeling cold, feeling hot Musculoskeletal:   Allergy/Immunology:   Neurological: Numbness Psychiatric: Not enough sleep Sleep:   The following represents the patient's updated allergies and side effects list: No Known Allergies  The neurologically relevant items on the patient's problem list were reviewed on today's visit.  Neurologic Examination  A problem focused neurological exam (12 or more points of the single system neurologic examination, vital signs counts as 1 point, cranial nerves count for 8 points) was performed.  Blood pressure (!) 150/92, pulse 70, height 5\' 7"  (1.702 m), weight 125 lb  3.2 oz (56.8 kg).  General - Well nourished, well developed, in no apparent distress.  Ophthalmologic - Fundi not visualized due to noncooperation.  Cardiovascular - Regular rate and rhythm.  Mental Status -  Level of  arousal and orientation to time, place, and person were intact. Language including expression, naming, repetition, comprehension was assessed and found intact. Attention span and concentration were normal. Fund of Knowledge was assessed and was intact.  Cranial Nerves II - XII - II - Visual field intact OU. III, IV, VI - Extraocular movements intact. V - Facial sensation intact bilaterally. VII - Facial movement intact bilaterally. VIII - left ear hearing loss, b/l normal rinne's test, but weber's test on the right. X - Palate elevates symmetrically. XI - Chin turning & shoulder shrug intact bilaterally. XII - Tongue protrusion intact.  Motor Strength - The patient's strength was normal in all extremities and pronator drift was absent.  Bulk was normal and fasciculations were absent.   Motor Tone - Muscle tone was assessed at the neck and appendages and was normal.  Reflexes - The patient's reflexes were 1+ in all extremities and he had no pathological reflexes.  Sensory - Light touch, temperature/pinprick, vibration and proprioception, and Romberg testing were assessed and were normal.    Coordination - The patient had normal movements in the hands and feet with no ataxia or dysmetria.  Tremor was absent.  Gait and Station - The patient's transfers, posture, gait, station, and turns were observed as normal.   Functional score  mRS = 1   0 - No symptoms.   1 - No significant disability. Able to carry out all usual activities, despite some symptoms.   2 - Slight disability. Able to look after own affairs without assistance, but unable to carry out all previous activities.   3 - Moderate disability. Requires some help, but able to walk unassisted.   4 - Moderately severe disability. Unable to attend to own bodily needs without assistance, and unable to walk unassisted.   5 - Severe disability. Requires constant nursing care and attention, bedridden, incontinent.   6 -  Dead.   NIH Stroke Scale = 0  Data reviewed: I personally reviewed the images and agree with the radiology interpretations.  Thomas [name] 06/28/2017 IMPRESSION:  1. Three acute nonhemorrhagic small LEFT cerebellar infarcts.  2. Old small bilateral cerebellar infarcts. Old basal ganglia and thalamus infarcts.   Thomas Jodene [name] Head Wo [name] 06/30/2017 IMPRESSION:  Occlusion of the right M1 segment.  Severe stenosis distal right vertebral artery.  Mild to moderate stenosis in the posterior cerebral artery bilaterally.   Thomas [name] 06/30/2017 IMPRESSION:  Negative for carotid stenosis in the neck  Dominant left vertebral artery widely patent.  Small right vertebral artery with moderate to severe stenosis proximally and distally.  Thomas [name] 06/30/2017 IMPRESSION:  Normal enhancement postcontrast infusion. Small acute infarcts left cerebellum do not enhance.   Transthoracic Echocardiogram  06/30/2017 Study Conclusions - Left ventricle: The cavity size was normal. There was moderate focal basal hypertrophy. Systolic function was normal. The estimated ejection fraction was in the range of 60% to 65%. Wall motion was normal; there were no regional wall motion abnormalities. The study is not technically sufficient to allow evaluation of LV diastolic function. - Aortic valve: Trileaflet; mildly thickened, mildly calcified leaflets. There was mild regurgitation.  Ct Abdomen Pelvis W [name] 07/01/2017 IMPRESSION: 1. Fusiform infrarenal abdominal aortic aneurysm measures up to  6.6 cm and maximum diameter. There is Peri aneurysm all edema/hemorrhage with a large left-sided retroperitoneal hematoma lateral to the left psoas muscle in tracking down into the left pelvic sidewall. Imaging features are compatible with aortic aneurysm rupture. 2. Probable infarct posterior spleen with small subcapsular hematoma. 3. No substantial intraperitoneal free  fluid. 4. Small low-density renal lesions bilaterally, likely cysts. 5. 13 mm left adrenal nodule cannot be further characterized on this study.  Dg Chest Port 1 View  07/02/2017 IMPRESSION: Appliances appear in satisfactory position. Shallow inspiration with increasing atelectasis in the lung bases. Small bilateral pleural effusions. Chronic emphysematous changes and fibrosis in the lungs. Cardiac enlargement.   CTA aortic artery 1. Left retroperitoneal hematoma has decreased in size and no evidence for new or increased retroperitoneal bleeding. 2. Endovascular repair of the abdominal aortic aneurysm. Gas within the abdominal aortic sac probably represents postoperative changes. Patient is at risk for a type 2 endoleak from the IMA but difficult to characterize on this examination and recommend attention on follow up imaging. 3. Thrombus in the right stent graft limb causing 60-70% narrowing of the lumen. Thrombus in the distal common iliac artery and at the iliac artery bifurcation. 4. Severe atherosclerotic disease involving the thoracic aorta. Thrombus within the right graft limb could be related to the recent surgery but patient is also at risk for embolic disease from the thoracic aorta. Right renal artery stenosis. 5. Aneurysm of the ascending thoracic aorta measuring at least 4.4 cm. No evidence for an thoracic aortic dissection. Recommend annual imaging followup by CTA or MRA. This recommendation follows 2010 ACCF/AHA/AATS/ACR/ASA/SCA/SCAI/SIR/STS/SVM Guidelines for the Diagnosis and Management of Patients with Thoracic Aortic Disease. Circulation. 2010; 121: [id number] 6. Small bilateral effusions with diffuse bilateral lung densities. Findings are suggestive for pulmonary edema but cannot exclude infection. 7. Indeterminate left adrenal nodule. Recommend attention on follow up imaging.  CT abd/pelvis 07/08/17 1. Interval stent grafting of the infrarenal abdominal  aortic aneurysm, now slightly decreased in size, measuring 6.3 cm, previously 6.6 cm. Small amount of air within the excluded aneurysm adjacent to the right iliac limb, which can be normal in the early postprocedural period. Infection is thought to be less likely. 2. Interval decrease in size of the left retroperitoneal hematoma. 3. No discrete intra-abdominal fluid collection. 4. Slight interval decrease in size of small subcapsular splenic hematoma. 5. Indeterminate 13 mm left adrenal nodule, probably benign given its size. Consider follow-up adrenal protocol CT in 12 months for further evaluation. This recommendation follows ACR consensus guidelines: Management of Incidental Adrenal Masses: A White Paper of the ACR Incidental Findings Committee. J Am Coll Radiol 2017;14:1038-1044.  Component     Latest Ref Rng & Units 06/28/2017 06/29/2017 07/16/2017 08/06/2017  Cholesterol     0 - 200 mg/dL  128    Triglycerides     <150 mg/dL  97    HDL Cholesterol     >40 mg/dL  37 (L)    Total CHOL/HDL Ratio     RATIO  3.5    VLDL     0 - 40 mg/dL  19    LDL (calc)     0 - 99 mg/dL  72    Hemoglobin A1C     4.8 - 5.6 %  5.6    Mean Plasma Glucose     mg/dL  114.02    HIV Screen 4th Generation wRfx     Non Reactive Non Reactive     TSH  0.350 - 4.500 uIU/mL 0.471     Sed Rate     0 - 16 mm/hr   121 (H) 64 (H)  CRP     <1.0 mg/dL   12.9 (H) 2.4 (H)    Assessment: As you may recall, he is a 61 y.o. Asian male with PMH of hypertension, tobacco use and previous strokes by imaging admitted on 4/4/oh 19 for AAA rupture s/p emergent endovascular repair, bilateral iliofemoral endarterectomy, and BLE thrumboembolectomies.  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  Symptoms consistent with PVD, no further surgery performed at that time.  Patient numbness and weakness  on the right LE gradually improved over time.  Due to dizziness, fever, chills, nausea vomiting, patient had MRI with and without [name] showed 3 acute small left cerebellar infarcts, old small bilateral cerebellar infarcts as well as old BG and thalamus infarcts.  MRA head and neck revealed right M1 chronic occlusion, bilateral PCA stenosis in the terminal right VA stenosis, no mycotic aneurysm.  EF 60 to 65%.  LDL 72 and A1c 5.6.  Initially plan for TEE to rule out endocarditis, however, patient deemed high risk, then started empiric treatment of Rocephin for 6 weeks.  After anemia stabilized, patient started on aspirin and Lipitor.  Discharging stable condition, recommend quit smoking. Pt still has left lower quadrant abdominal pain likely due to resolving left retroperitoneal hematoma. He has appointment with [facility] on 08/13/2017 for repeat CT angios abdomen and pelvis.  He is about to finish 6 weeks of antibiotics soon.    At this appointment, patient stated that he had intermittent vertigo episodes since 2014.  Also has left ear ringing initially, but then about 3 to 4 years ago left ear severe hearing loss. Concerning for meniere's disease.  Plan:  - continue ASA and lipitor for stroke prevention and peripheral vascular disease  - follow up with vascular surgery and cardiology - continue to complete the antibiotics  - will refer to ENT for possible meniere's disease.  - Follow up with your primary care physician for stroke risk factor modification. Recommend maintain blood pressure goal <130/80, diabetes with hemoglobin A1c goal below 7.0% and lipids with LDL cholesterol goal below 70 mg/dL.  - check BP at home and record.  - follow up in 3 months with Thomas [name].  I spent more than 25 minutes of face to face time with the patient. Greater than 50% of time was spent in counseling and coordination of care. We discussed follow up with [facility], and cardiology, refer to ENT for meniere's disease.   Orders  Placed This Encounter  Procedures  . Ambulatory referral to ENT    Referral Priority:   Routine    Referral Type:   Consultation    Referral Reason:   Specialty Services Required    Requested Specialty:   Otolaryngology    Number of Visits Requested:   1    No orders of the defined types were placed in this encounter.   Patient Instructions  - continue ASA and lipitor for stroke prevention and peripheral vascular disease  - follow up with vascular surgery and cardiology - continue to complete the antibiotics  - will refer to ENT for possible meniere's disease.  - Follow up with your primary care physician for stroke risk factor modification. Recommend maintain blood pressure goal <130/80, diabetes with hemoglobin A1c goal below 7.0%  and lipids with LDL cholesterol goal below 70 mg/dL.  - check BP at home and record.  - follow up in 3 months with Thomas [name].   Thomas Hawking, MD PhD [facility] [address] [place],  [address] [phone]

## 2017-08-08 NOTE — Patient Instructions (Signed)
-   continue ASA and lipitor for stroke prevention and peripheral vascular disease  - follow up with vascular surgery and cardiology - continue to complete the antibiotics  - will refer to ENT for possible meniere's disease.  - Follow up with your primary care physician for stroke risk factor modification. Recommend maintain blood pressure goal <130/80, diabetes with hemoglobin A1c goal below 7.0% and lipids with LDL cholesterol goal below 70 mg/dL.  - check BP at home and record.  - follow up in 3 months with Janett Billow.

## 2017-08-13 ENCOUNTER — Encounter (HOSPITAL_COMMUNITY): Payer: Private Health Insurance - Indemnity

## 2017-08-13 ENCOUNTER — Inpatient Hospital Stay: Admission: RE | Admit: 2017-08-13 | Payer: Private Health Insurance - Indemnity | Source: Ambulatory Visit

## 2017-08-13 ENCOUNTER — Telehealth: Payer: Self-pay | Admitting: *Deleted

## 2017-08-13 ENCOUNTER — Encounter: Payer: Private Health Insurance - Indemnity | Admitting: Surgery

## 2017-08-13 NOTE — Telephone Encounter (Signed)
Call from patient's daughter. Patient is dizzy and has trouble sitting up and walking and is sick today. Has Hx. Of Meniere's. Will reschedule follow up appointment.

## 2017-08-15 ENCOUNTER — Emergency Department (HOSPITAL_COMMUNITY): Payer: Private Health Insurance - Indemnity

## 2017-08-15 ENCOUNTER — Encounter (HOSPITAL_COMMUNITY): Payer: Self-pay

## 2017-08-15 ENCOUNTER — Other Ambulatory Visit: Payer: Self-pay

## 2017-08-15 ENCOUNTER — Observation Stay (HOSPITAL_COMMUNITY): Payer: Private Health Insurance - Indemnity

## 2017-08-15 ENCOUNTER — Inpatient Hospital Stay (HOSPITAL_COMMUNITY)
Admission: EM | Admit: 2017-08-15 | Discharge: 2017-08-18 | DRG: 065 | Disposition: A | Discharge status: Rehabilitation Facility | Payer: Private Health Insurance - Indemnity | Attending: Family Medicine | Admitting: Family Medicine

## 2017-08-15 DIAGNOSIS — R2681 Unsteadiness on feet: Secondary | ICD-10-CM | POA: Diagnosis present

## 2017-08-15 DIAGNOSIS — Z8679 Personal history of other diseases of the circulatory system: Secondary | ICD-10-CM

## 2017-08-15 DIAGNOSIS — I63443 Cerebral infarction due to embolism of bilateral cerebellar arteries: Secondary | ICD-10-CM

## 2017-08-15 DIAGNOSIS — I739 Peripheral vascular disease, unspecified: Secondary | ICD-10-CM | POA: Diagnosis present

## 2017-08-15 DIAGNOSIS — F17200 Nicotine dependence, unspecified, uncomplicated: Secondary | ICD-10-CM | POA: Diagnosis present

## 2017-08-15 DIAGNOSIS — I712 Thoracic aortic aneurysm, without rupture: Secondary | ICD-10-CM | POA: Diagnosis present

## 2017-08-15 DIAGNOSIS — R260 Ataxic gait: Secondary | ICD-10-CM | POA: Diagnosis present

## 2017-08-15 DIAGNOSIS — I6501 Occlusion and stenosis of right vertebral artery: Secondary | ICD-10-CM | POA: Diagnosis present

## 2017-08-15 DIAGNOSIS — I9789 Other postprocedural complications and disorders of the circulatory system, not elsewhere classified: Secondary | ICD-10-CM

## 2017-08-15 DIAGNOSIS — I6789 Other cerebrovascular disease: Secondary | ICD-10-CM | POA: Diagnosis present

## 2017-08-15 DIAGNOSIS — I10 Essential (primary) hypertension: Secondary | ICD-10-CM | POA: Diagnosis present

## 2017-08-15 DIAGNOSIS — R5381 Other malaise: Secondary | ICD-10-CM | POA: Diagnosis not present

## 2017-08-15 DIAGNOSIS — R29898 Other symptoms and signs involving the musculoskeletal system: Secondary | ICD-10-CM

## 2017-08-15 DIAGNOSIS — G8220 Paraplegia, unspecified: Secondary | ICD-10-CM

## 2017-08-15 DIAGNOSIS — R200 Anesthesia of skin: Secondary | ICD-10-CM | POA: Diagnosis present

## 2017-08-15 DIAGNOSIS — I700 Atherosclerosis of aorta: Secondary | ICD-10-CM | POA: Diagnosis present

## 2017-08-15 DIAGNOSIS — N189 Chronic kidney disease, unspecified: Secondary | ICD-10-CM | POA: Diagnosis present

## 2017-08-15 DIAGNOSIS — R29701 NIHSS score 1: Secondary | ICD-10-CM | POA: Diagnosis present

## 2017-08-15 DIAGNOSIS — R531 Weakness: Secondary | ICD-10-CM | POA: Diagnosis present

## 2017-08-15 DIAGNOSIS — I129 Hypertensive chronic kidney disease with stage 1 through stage 4 chronic kidney disease, or unspecified chronic kidney disease: Secondary | ICD-10-CM | POA: Diagnosis present

## 2017-08-15 DIAGNOSIS — Z8673 Personal history of transient ischemic attack (TIA), and cerebral infarction without residual deficits: Secondary | ICD-10-CM

## 2017-08-15 DIAGNOSIS — N183 Chronic kidney disease, stage 3 (moderate): Secondary | ICD-10-CM | POA: Diagnosis present

## 2017-08-15 DIAGNOSIS — I6340 Cerebral infarction due to embolism of unspecified cerebral artery: Secondary | ICD-10-CM

## 2017-08-15 DIAGNOSIS — G463 Brain stem stroke syndrome: Secondary | ICD-10-CM | POA: Diagnosis present

## 2017-08-15 DIAGNOSIS — D72829 Elevated white blood cell count, unspecified: Secondary | ICD-10-CM | POA: Diagnosis present

## 2017-08-15 DIAGNOSIS — I454 Nonspecific intraventricular block: Secondary | ICD-10-CM | POA: Diagnosis not present

## 2017-08-15 DIAGNOSIS — E785 Hyperlipidemia, unspecified: Secondary | ICD-10-CM | POA: Diagnosis present

## 2017-08-15 DIAGNOSIS — R5383 Other fatigue: Secondary | ICD-10-CM

## 2017-08-15 DIAGNOSIS — Z7982 Long term (current) use of aspirin: Secondary | ICD-10-CM

## 2017-08-15 DIAGNOSIS — R935 Abnormal findings on diagnostic imaging of other abdominal regions, including retroperitoneum: Secondary | ICD-10-CM | POA: Diagnosis present

## 2017-08-15 DIAGNOSIS — R102 Pelvic and perineal pain: Secondary | ICD-10-CM | POA: Diagnosis present

## 2017-08-15 LAB — DIFFERENTIAL
Abs Immature Granulocytes: 0 10*3/uL (ref 0.0–0.1)
BASOS ABS: 0.1 10*3/uL (ref 0.0–0.1)
Basophils Relative: 1 %
Eosinophils Absolute: 1.4 10*3/uL — ABNORMAL HIGH (ref 0.0–0.7)
Eosinophils Relative: 12 %
Immature Granulocytes: 0 %
LYMPHS ABS: 3.5 10*3/uL (ref 0.7–4.0)
LYMPHS PCT: 30 %
MONO ABS: 0.6 10*3/uL (ref 0.1–1.0)
MONOS PCT: 5 %
NEUTROS ABS: 6.1 10*3/uL (ref 1.7–7.7)
Neutrophils Relative %: 52 %

## 2017-08-15 LAB — URINALYSIS, ROUTINE W REFLEX MICROSCOPIC
BILIRUBIN URINE: NEGATIVE
Glucose, UA: NEGATIVE mg/dL
Ketones, ur: NEGATIVE mg/dL
Leukocytes, UA: NEGATIVE
Nitrite: NEGATIVE
Protein, ur: 30 mg/dL — AB
SPECIFIC GRAVITY, URINE: 1.02 (ref 1.005–1.030)
pH: 5 (ref 5.0–8.0)

## 2017-08-15 LAB — CBC
HEMATOCRIT: 42.2 % (ref 39.0–52.0)
HEMOGLOBIN: 13.3 g/dL (ref 13.0–17.0)
MCH: 24.9 pg — AB (ref 26.0–34.0)
MCHC: 31.5 g/dL (ref 30.0–36.0)
MCV: 78.9 fL (ref 78.0–100.0)
Platelets: 383 10*3/uL (ref 150–400)
RBC: 5.35 MIL/uL (ref 4.22–5.81)
RDW: 16 % — AB (ref 11.5–15.5)
WBC: 11.8 10*3/uL — ABNORMAL HIGH (ref 4.0–10.5)

## 2017-08-15 LAB — I-STAT CG4 LACTIC ACID, ED: Lactic Acid, Venous: 0.68 mmol/L (ref 0.5–1.9)

## 2017-08-15 LAB — COMPREHENSIVE METABOLIC PANEL
ALT: 20 U/L (ref 17–63)
AST: 25 U/L (ref 15–41)
Albumin: 3.5 g/dL (ref 3.5–5.0)
Alkaline Phosphatase: 113 U/L (ref 38–126)
Anion gap: 9 (ref 5–15)
BILIRUBIN TOTAL: 0.7 mg/dL (ref 0.3–1.2)
BUN: 18 mg/dL (ref 6–20)
CO2: 26 mmol/L (ref 22–32)
CREATININE: 1.41 mg/dL — AB (ref 0.61–1.24)
Calcium: 9.5 mg/dL (ref 8.9–10.3)
Chloride: 103 mmol/L (ref 101–111)
GFR calc non Af Amer: 52 mL/min — ABNORMAL LOW (ref >60)
GFR, EST AFRICAN AMERICAN: 60 mL/min — AB (ref >60)
Glucose, Bld: 95 mg/dL (ref 65–99)
Potassium: 4.2 mmol/L (ref 3.5–5.1)
SODIUM: 138 mmol/L (ref 135–145)
TOTAL PROTEIN: 8.9 g/dL — AB (ref 6.5–8.1)

## 2017-08-15 LAB — PROTIME-INR
INR: 1.01
Prothrombin Time: 13.2 seconds (ref 11.4–15.2)

## 2017-08-15 LAB — I-STAT TROPONIN, ED: Troponin i, poc: 0 ng/mL (ref 0.00–0.08)

## 2017-08-15 LAB — LIPASE, BLOOD: LIPASE: 31 U/L (ref 11–51)

## 2017-08-15 LAB — APTT: aPTT: 33 seconds (ref 24–36)

## 2017-08-15 MED ORDER — PANTOPRAZOLE SODIUM 40 MG PO TBEC
40.0000 mg | DELAYED_RELEASE_TABLET | Freq: Every day | ORAL | Status: DC
  Administered 2017-08-16 – 2017-08-18 (×3): 40 mg via ORAL
  Filled 2017-08-15 (×3): qty 1

## 2017-08-15 MED ORDER — ONDANSETRON HCL 4 MG PO TABS: 4.0000 mg | ORAL_TABLET | Freq: Four times a day (QID) | ORAL | Status: DC | PRN

## 2017-08-15 MED ORDER — ONDANSETRON HCL 4 MG/2ML IJ SOLN: 4.0000 mg | Freq: Four times a day (QID) | INTRAMUSCULAR | Status: DC | PRN

## 2017-08-15 MED ORDER — SODIUM CHLORIDE 0.9 % IV SOLN
INTRAVENOUS | Status: DC
  Administered 2017-08-15 – 2017-08-16 (×2): via INTRAVENOUS

## 2017-08-15 MED ORDER — ATORVASTATIN CALCIUM 40 MG PO TABS: 40.0000 mg | ORAL_TABLET | Freq: Every day | ORAL | Status: DC

## 2017-08-15 MED ORDER — ACETAMINOPHEN 325 MG PO TABS
650.0000 mg | ORAL_TABLET | Freq: Four times a day (QID) | ORAL | Status: DC | PRN
  Administered 2017-08-17 – 2017-08-18 (×3): 650 mg via ORAL
  Filled 2017-08-15 (×3): qty 2

## 2017-08-15 MED ORDER — TRAZODONE HCL 50 MG PO TABS
25.0000 mg | ORAL_TABLET | Freq: Every evening | ORAL | Status: DC | PRN
  Administered 2017-08-16: 25 mg via ORAL
  Filled 2017-08-15: qty 1

## 2017-08-15 MED ORDER — IOPAMIDOL (ISOVUE-370) INJECTION 76%
INTRAVENOUS | Status: AC
  Administered 2017-08-15: 100 mL via INTRAVENOUS
  Filled 2017-08-15: qty 100

## 2017-08-15 MED ORDER — BISACODYL 10 MG RE SUPP: 10.0000 mg | Freq: Every day | RECTAL | Status: DC | PRN

## 2017-08-15 MED ORDER — ENOXAPARIN SODIUM 40 MG/0.4ML ~~LOC~~ SOLN
40.0000 mg | SUBCUTANEOUS | Status: DC
  Administered 2017-08-16: 40 mg via SUBCUTANEOUS
  Filled 2017-08-15: qty 0.4

## 2017-08-15 MED ORDER — DIPHENHYDRAMINE HCL 25 MG PO CAPS: 25.0000 mg | ORAL_CAPSULE | Freq: Three times a day (TID) | ORAL | Status: DC | PRN

## 2017-08-15 MED ORDER — SODIUM CHLORIDE 0.9 % IV BOLUS
500.0000 mL | Freq: Once | INTRAVENOUS | Status: AC
  Administered 2017-08-15: 500 mL via INTRAVENOUS

## 2017-08-15 MED ORDER — ACETAMINOPHEN 650 MG RE SUPP: 650.0000 mg | Freq: Four times a day (QID) | RECTAL | Status: DC | PRN

## 2017-08-15 MED ORDER — SENNOSIDES-DOCUSATE SODIUM 8.6-50 MG PO TABS
2.0000 | ORAL_TABLET | Freq: Every day | ORAL | Status: DC
  Administered 2017-08-16 – 2017-08-17 (×3): 2 via ORAL
  Filled 2017-08-15 (×3): qty 2

## 2017-08-15 MED ORDER — METOCLOPRAMIDE HCL 5 MG/ML IJ SOLN
10.0000 mg | Freq: Three times a day (TID) | INTRAMUSCULAR | Status: AC
  Administered 2017-08-16 (×3): 10 mg via INTRAVENOUS
  Filled 2017-08-15 (×3): qty 2

## 2017-08-15 MED ORDER — ASPIRIN EC 81 MG PO TBEC
81.0000 mg | DELAYED_RELEASE_TABLET | Freq: Every day | ORAL | Status: DC
  Administered 2017-08-16: 81 mg via ORAL

## 2017-08-15 NOTE — H&P (Signed)
History and Physical    Yordin Rhoda FOY:[id number] DOB: 1955-08-08 DOA: 08/15/2017  PCP: Patient, No Pcp Per Patient coming from: home  Chief Complaint: pelvic pain/gait instability  HPI: Jazen Alcindor is a very pleasant 62 y.o. 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 Initial evaluation includes CTA abdomen and pelvis without hematoma weeks but reveals enlarged aneurysm sac. [facility] asked to admit  Information is obtained from the patient and the chart. He states he was in his usual state of health until 3 days ago he developed bilateral lower extremity weakness. Associated symptoms include pain in his hips and pelvis and groin. This morning he could not stand. Chart review indicates he is status post emergent endovascular repair of ruptured abdominal aortic aneurysm with subsequent bilateral iliofemoral endarterectomy and thromboembolectomy last month. Hospital course complicated with infective pericarditis he has completed his IV antibiotics. He also complains of a headache. He denies fever chills abdominal pain nausea vomiting diarrhea constipation melena bright red blood per rectum. He denies dysuria hematuria frequency or urgency.   ED Course: in the emergency department he's afebrile with a blood pressure on the high end of normal he is not hypoxic nontoxic appearing  Review of Systems: As per HPI otherwise all other systems reviewed and are negative.   Ambulatory Status:ambulates independently and is independent with ADLs  Past Medical History:  Diagnosis Date  . Hypertension   . Stroke [facility])     Past Surgical History:  Procedure Laterality Date  . ENDOVASCULAR STENT INSERTION N/A 07/01/2017   Procedure: ENDOVASCULAR STENT GRAFT INSERTION;  Surgeon: Serafina Mitchell, MD;  Location: [facility] OR;  Service: Vascular;  Laterality: N/A;  . THROMBECTOMY FEMORAL ARTERY Bilateral 07/01/2017   Procedure: Bilateral IlioFemoral Thrombectomies;  Surgeon: Serafina Mitchell, MD;  Location: [facility] OR;  Service: Vascular;  Laterality: Bilateral;    Social History   Socioeconomic History  . Marital status: Divorced    Spouse name: Not on file  . Number of children: Not on file  . Years of education: Not on file  . Highest education level: Not on file  Occupational History  . Not on file  Social Needs  . Financial resource strain: Not on file  . Food insecurity:    Worry: Not on file    Inability: Not on file  . Transportation needs:    Medical: Not on file    Non-medical: Not on file  Tobacco Use  . Smoking status: Former Smoker    Types: Cigarettes    Last attempt to quit: 07/16/2017    Years since quitting: 0.0  . Smokeless tobacco: Never Used  Substance and Sexual Activity  . Alcohol use: No  . Drug use: No  . Sexual activity: Not on file  Lifestyle  . Physical activity:    Days per week: Not on file    Minutes per session: Not on file  . Stress: Not on file  Relationships  . Social connections:    Talks on phone: Not on file    Gets together: Not on file    Attends religious service: Not on file    Active member of club or organization: Not on file    Attends meetings of clubs or organizations: Not on file    Relationship status: Not on file  . Intimate partner  violence:    Fear of current or ex partner: Not on file    Emotionally abused: Not on file    Physically abused: Not on file    Forced sexual activity: Not on file  Other Topics Concern  . Not on file  Social History Narrative  . Not on file    No Known Allergies  No family history on file.  Prior to Admission medications   Medication Sig Start Date End Date Taking? Authorizing Provider  aspirin EC 81 MG EC tablet Take 1 tablet (81 mg total) by mouth daily. 07/13/17   Amin, Jeanella Flattery, MD  atorvastatin (LIPITOR) 40 MG tablet Take 1 tablet (40 mg total) by mouth daily at 6 PM. 07/12/17   Amin, Jeanella Flattery, MD  cefTRIAXone (ROCEPHIN) IVPB Inject 2 g into the vein every 12 (twelve) hours. Indication:  Pneumococcal bacteremia and possible endovascular infection with CNS emboli Last Day of Therapy:  08/12/17 Labs - Once weekly:  CBC/D and BMP, Labs - Every other week:  ESR and CRP 07/12/17 08/19/17  Amin, Jeanella Flattery, MD  cefTRIAXone (ROCEPHIN) IVPB Inject 2 g into the vein every 12 (twelve) hours. Indication:  Bacteremia Last Day of Therapy:  May  19 Labs - Once weekly:  CBC/D and BMP, Labs - Every other week:  ESR and CRP 07/14/17   Amin, Jeanella Flattery, MD  cyclobenzaprine (FLEXERIL) 10 MG tablet Take 1 tablet (10 mg total) by mouth 2 (two) times daily as needed for muscle spasms. 02/27/17   Khatri, Hina, PA-C  pantoprazole (PROTONIX) 40 MG tablet Take 1 tablet (40 mg total) by mouth daily at 12 noon. 07/12/17   Amin, Ankit Chirag, MD  senna-docusate (SENOKOT-S) 8.6-50 MG tablet Take 2 tablets by mouth at bedtime. 07/12/17   Damita Lack, MD    Physical Exam: Vitals:   08/15/17 1018 08/15/17 1202 08/15/17 1230 08/15/17 1330  BP: (!) 153/85 (!) 157/97 (!) 155/81 (!) 152/93  Pulse: 72 63 (!) 54 (!) 57  Resp: 16 14    Temp: 98.1 F (36.7 C)     TempSrc: Oral     SpO2: 100% 100% 99% 100%  Weight: 56.7 kg (125 lb)     Height: '5\' 7"'  (1.702 m)        General:  Appears calm and comfortable Eyes:  PERRL, EOMI, normal lids, iris ENT:  grossly normal hearing, lips & tongue, because membranes of his mouth are moist and pink Neck:  no LAD, masses or thyromegaly Cardiovascular:  RRR, no m/r/g. No LE edema. Pedal pulses present and palpable Respiratory:  CTA bilaterally, no w/r/r. Normal respiratory effort. Abdomen:  soft, ntnd, positive bowel sounds throughout no guarding or rebounding Skin:  no rash or induration seen on limited exam Musculoskeletal:  grossly normal  tone BUE/BLE, good ROM, no bony abnormality  Psychiatric:  grossly normal mood and affect, speech fluent and appropriate, AOx3 Neurologic:  CN 2-12 grossly intact, moves all extremities in coordinated fashion, sensation intact speech clear facial symmetry lateral grip 5 out of 5. Lower extremity strength 4 out of 5 sensation intact  Labs on Admission: I have personally reviewed following labs and imaging studies  CBC: Recent Labs  Lab 08/15/17 1016  WBC 11.8*  NEUTROABS 6.1  HGB 13.3  HCT 42.2  MCV 78.9  PLT 962   Basic Metabolic Panel: Recent Labs  Lab 08/15/17 1016  NA 138  K 4.2  CL 103  CO2 26  GLUCOSE 95  BUN  18  CREATININE 1.41*  CALCIUM 9.5   GFR: Estimated Creatinine Clearance: 43 mL/min (A) (by C-G formula based on SCr of 1.41 mg/dL (H)). Liver Function Tests: Recent Labs  Lab 08/15/17 1016  AST 25  ALT 20  ALKPHOS 113  BILITOT 0.7  PROT 8.9*  ALBUMIN 3.5   Recent Labs  Lab 08/15/17 1016  LIPASE 31   No results for input(s): AMMONIA in the last 168 hours. Coagulation Profile: Recent Labs  Lab 08/15/17 1016  INR 1.01   Cardiac Enzymes: No results for input(s): CKTOTAL, CKMB, CKMBINDEX, TROPONINI in the last 168 hours. BNP (last 3 results) No results for input(s): PROBNP in the last 8760 hours. HbA1C: No results for input(s): HGBA1C in the last 72 hours. CBG: No results for input(s): GLUCAP in the last 168 hours. Lipid Profile: No results for input(s): CHOL, HDL, LDLCALC, TRIG, CHOLHDL, LDLDIRECT in the last 72 hours. Thyroid Function Tests: No results for input(s): TSH, T4TOTAL, FREET4, T3FREE, THYROIDAB in the last 72 hours. Anemia Panel: No results for input(s): VITAMINB12, FOLATE, FERRITIN, TIBC, IRON, RETICCTPCT in the last 72 hours. Urine analysis:    Component Value Date/Time   COLORURINE YELLOW 08/15/2017 [place] 08/15/2017 1526   LABSPEC 1.020 08/15/2017 1526   PHURINE 5.0 08/15/2017 1526   GLUCOSEU NEGATIVE  08/15/2017 1526   HGBUR SMALL (A) 08/15/2017 1526   BILIRUBINUR NEGATIVE 08/15/2017 1526   BILIRUBINUR negative 06/28/2017 1415   KETONESUR NEGATIVE 08/15/2017 1526   PROTEINUR 30 (A) 08/15/2017 1526   UROBILINOGEN 2.0 (A) 06/28/2017 1415   NITRITE NEGATIVE 08/15/2017 1526   LEUKOCYTESUR NEGATIVE 08/15/2017 1526    Creatinine Clearance: Estimated Creatinine Clearance: 43 mL/min (A) (by C-G formula based on SCr of 1.41 mg/dL (H)).  Sepsis Labs: '@LABRCNTIP' (procalcitonin:4,lacticidven:4) )No results found for this or any previous visit (from the past 240 hour(s)).   Radiological Exams on Admission: Dg Chest 2 View  Result Date: 08/15/2017 CLINICAL DATA:  Patient admitted for stroke.  Weakness. EXAM: CHEST - 2 VIEW COMPARISON:  07/04/2017 FINDINGS: The heart size and mediastinal contours are within normal limits. Both lungs are clear. Subpleural scarring is seen along the periphery of the left lung base. Right-sided PICC line catheter terminates in the distal SVC. The visualized skeletal structures are unremarkable. IMPRESSION: No active cardiopulmonary disease. Electronically Signed   By: Ashley Royalty M.D.   On: 08/15/2017 14:18   Ct Head Wo Contrast  Result Date: 08/15/2017 CLINICAL DATA:  Recent stroke in April 2019. Difficulty walking without falling. Headache and lower extremity pain. EXAM: CT HEAD WITHOUT CONTRAST TECHNIQUE: Contiguous axial images were obtained from the base of the skull through the vertex without intravenous contrast. COMPARISON:  None. FINDINGS: Brain: No mass lesion, intraparenchymal hemorrhage or extra-axial collection. No evidence of acute cortical infarct. Bilateral old basal ganglia lacunar infarcts. There is periventricular hypoattenuation compatible with chronic microvascular disease. Vascular: Atherosclerotic calcification of the internal carotid arteries at the skull base. No hyperdense vessel. Skull: Normal visualized skull base, calvarium and extracranial soft  tissues. Sinuses/Orbits: No fluid levels or advanced mucosal thickening of the visualized paranasal sinuses. No mastoid or middle ear effusion. The orbits are normal. IMPRESSION: Old bilateral basal ganglia lacunar infarcts and sequelae of chronic small vessel disease without acute intracranial abnormality. Electronically Signed   By: Ulyses Jarred M.D.   On: 08/15/2017 14:29   Ct Angio Abd/pel W And/or Wo Contrast  Result Date: 08/15/2017 CLINICAL DATA:  62 year old male with a history of ruptured  abdominal aortic aneurysm, endovascular repair performed 07/01/2017 EXAM: CTA ABDOMEN AND PELVIS wITHOUT AND WITH CONTRAST TECHNIQUE: Multidetector CT imaging of the abdomen and pelvis was performed using the standard protocol during bolus administration of intravenous contrast. Multiplanar reconstructed images and MIPs were obtained and reviewed to evaluate the vascular anatomy. CONTRAST:  100 cc  ISOVUE-370 IOPAMIDOL (ISOVUE-370) INJECTION 76% COMPARISON:  07/08/2017, 07/03/2017, 07/01/2017 FINDINGS: VASCULAR Aorta: Re- demonstration of endovascular repair of ruptured infrarenal abdominal aortic aneurysm, main device from the right and the contralateral gate from the left. Infrarenal fixation, with the graft margin just below the lowest, left, renal artery. No evidence of a type 1 or a type 2 endoleak. Intense differential enhancement of the wall of the excluded aneurysm on the delayed images. Significantly decreased size of the retroperitoneal hematoma adjacent to the psoas muscle. Greatest diameter today measures 3.0 cm x 3.4 cm on the axial images. Fluid extends inferiorly in a cone like configuration lateral to the left psoas muscle overlying the iliacus. Near complete resolution of hematoma surrounding the left iliac vasculature. The right limb terminates within the common iliac artery. The left limb terminates within the left common iliac artery. Greatest diameter of the excluded aneurysm sac measures 7.2 cm  (image 23 of series 15), which is larger than the comparison CT studies. Interval resolution of gas within the aneurysm sac. Celiac: Celiac arteries patent. No significant atherosclerotic changes at the origin. Traditional branch pattern of the celiac artery. Accessory left hepatic artery. SMA: Superior mesenteric artery is patent with no significant atherosclerotic changes at the origin. Renals: Main left and right renal arteries are patent. Again, irregularity at the proximal right renal artery may represent a fusiform aneurysm, with associated stenosis. Small accessory left renal artery above the main renal artery. Main renal artery appears patent, just above the proximal stent graft. IMA: IMA appears occluded at the origin. Left colic artery and superior rectal artery are patent via collateral flow. Right lower extremity: The right iliac limb is patent, with near complete interval resolution of the mural thrombus/thickening, with maintained flow channel. No evidence of a type 1 B endoleak. The hypogastric artery appears occluded in the interval. External iliac artery is patent. Surgical changes at the right common femoral artery. Proximal profunda femoris and superficial femoral artery patent. Left lower extremity: Left iliac limb is patent. No evidence of a type 1 B endoleak. Left hypogastric artery and external iliac artery are patent. Proximal SFA and profunda femoris patent. Veins: Unremarkable appearance of the venous system. Review of the MIP images confirms the above findings. NON-VASCULAR Lower chest: Evidence of mild interlobular septal thickening. Linear opacities at the lung bases. Calcified granuloma of the right middle lobe. Hepatobiliary: Unremarkable appearance of the liver. Hyperdense material within the gallbladder lumen compatible with cholelithiasis. No evidence of associated inflammatory changes. Pancreas: Unremarkable pancreas Spleen: Unremarkable spleen Adrenals/Urinary Tract: Enhancing  lesion of the left adrenal gland. Unremarkable appearance of the right adrenal gland. Right: No right-sided hydronephrosis. Similar appearance of cystic lesion at the superior right kidney. No nephrolithiasis. Unremarkable course the right ureter. Left: No left-sided hydronephrosis. 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 Unremarkable course the left ureter. Urinary bladder partially distended. Stomach/Bowel: Unremarkable stomach . Unremarkable small bowel. No abnormal distention. Moderate stool burden. No transition point. No inflammatory changes. Normal appendix. Colonic diverticular present without associated inflammation. No inflammatory changes within the small bowel mesenteric. Lymphatic: No lymphadenopathy. Reproductive: Unremarkable appearance of the pelvic organs. Other: Small fat containing umbilical hernia. Musculoskeletal: No  acute displaced fracture. Mild degenerative changes of the spine. IMPRESSION: Re-demonstration of endovascular repair of ruptured infrarenal abdominal aortic aneurysm. No evidence of endoleak or stent graft migration. The aneurysm sac demonstrates no internal enhancement or continued flow, however, there is intense mural enhancement on the delayed images, and the greatest diameter of the aneurysm sac has enlarged to a greatest diameter of 7.2 cm, previously 6.6 cm on presentation. The etiology of the enlargement is uncertain, potentially reactive or post inflammatory. Although gas has resolved in the interval, seen on the prior and most likely postoperative changes, infection cannot be excluded. There has been near complete resolution of the right limb mural thrombus, with patency maintained of the bilateral iliac limbs. Interval occlusion of the right hypogastric artery. Left hypogastric artery remains patent. Interval improvement of retroperitoneal hematoma, with near complete resolution adjacent to the iliac vasculature, and small persisting fluid collection lateral to  the left psoas muscle. These results were called by telephone at the time of interpretation on 08/15/2017 at 4:09 pm to Dr. Jeannie Done PFEIFFER. 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 Correlation with duplex may be useful. Cholelithiasis. Evidence of mild pulmonary edema. Signed, Dulcy Fanny. Earleen Newport, DO [facility] Electronically Signed   By: Corrie Mckusick D.O.   On: 08/15/2017 16:09    EKG: Independently reviewed. Normal sinus rhythm Possible Left atrial enlargement Left axis deviation Right bundle branch  Assessment/Plan Principal Problem:   Gait instability Active Problems:   Hypertension   Pelvic pain   Chronic kidney disease   Abnormal CT scan, pelvis   #1. Pelvic pain/gait instability.etiology unclear. He was recently underwent AAA rupture and repair. CT of the head without acute abnormalities. Reflexes intact. And station intact.  -Admit for observation -Physical therapy for gait assessment strenght test  #2. Abnormal CTA of the pelvis. Patient 6 weeks status post emergent endovascular repair of ruptured aortic abdominal aneurysm and bilateral lower extremity thrombectomies with pelvic pain and gait instability. CTA done to evaluate endograft.CTA reveals a patent endograft but does reveal likely reactive postoperative inflammatory changes to aneurysm sac. No obvious sign of infection. Evaluated by vascular surgery who opined no indication for surgical intervention but recommended admitting for observation for infection. -Obtain blood cultures -Monitor vital signs  #3. Chronic kidney disease stage III. Creatinine 1.4. -Hold nephrotoxins -Monitor urine output  #4. Hypertension. Blood pressures on the high and of normal. Currently home medications include no antihypertensives. -Monitor DVT prophylaxis: lovenox Code Status: full  Family  Communication: daughter at bedside  Disposition Plan: home  Consults called: brandon cain  Admission status: obs    Dyanne Carrel M MD [facility]  If 7PM-7AM, please contact night-coverage [email or web address] Password [facility]  08/15/2017, 6:27 PM

## 2017-08-15 NOTE — ED Notes (Signed)
Please note: urine culture also sent down with UA with instructions for main lab to hold (in case orders are put in).

## 2017-08-15 NOTE — ED Notes (Signed)
Attempted report x1. RN unavailable @ this time. Will re attempt.

## 2017-08-15 NOTE — ED Notes (Signed)
Pt provided with saltines, graham crackers, and water per MD

## 2017-08-15 NOTE — Progress Notes (Signed)
PICC removed with no signs/symptoms bleeding.  Vaseline and gauze pressure dressing applied with manual pressure held x 5 minutes.  Patient instructed do not get dressing wet leave on 24 hours, notify staff if bleeding occurs.  Patient aware that he is to stay in the bed x 30 minutes post removal.  Carolee Rota, RN VAST

## 2017-08-15 NOTE — ED Triage Notes (Signed)
Pt states he was admitted for a stroke,  discharge and able to walk in April he reports that on Sunday when he woke up he was unable to walk. Pt also c/o headache and pain in legs. Pt Neuro intact in triage. Family at bedside reports she has been holding onto the pt when walking to prevent him from falling.

## 2017-08-15 NOTE — ED Notes (Signed)
Pt and daughter informed a urine specimen needs to be collected. Pt and daughter given urinal; verbalized understanding.

## 2017-08-15 NOTE — ED Notes (Signed)
IV team bedside at this time to remove PICC line

## 2017-08-15 NOTE — ED Provider Notes (Addendum)
[place] EMERGENCY DEPARTMENT Provider Note   CSN: [id number] Arrival date & time: 08/15/17  3532     History   Chief Complaint Chief Complaint  Patient presents with  . Leg Pain    HPI Thomas [name] is a 62 y.o. male.  HPI Complex medical history with prior stroke, history of ruptured AAA with endovascular repair, bilateral iliofemoral endarterectomies with subsequent right iliac artery stent thrombosis.  He has had cerebellar infarcts.  Patient reports after his treatment he was doing better and reports he was walking independently.  He reports 3 days ago, Sunday he awakened and was unable to walk.  He describes pain as part of the issue in his hips and pelvis.  He also reports that he felt weak and could not move his legs appropriately.  He reports that he was having to hold onto things to move about.  He reports he has been crawling around.  His family member reports that they have been assisting the patient when he needs to get up to prevent him from falling.  He denies he had a fever.  He reports he has had some generalized headache.  No confusion.  No visual changes.  No vomiting or diarrhea.  No pain burning urgency with urination.  Patient indicates that his problem was more weakness than specifically pain although he still indicates that his groin areas where he had his procedures do give him discomfort.  Patient speaks English fairly well.  He is slightly difficult to understand due to accent but has sufficient mastery to describe all of his symptoms.  His daughter is at bedside and speaks English clearly but does not add any additional historical information except to corroborate that he is needed to help to stand and move around. Past Medical History:  Diagnosis Date  . Hypertension   . Stroke [facility])     Patient Active Problem List   Diagnosis Date Noted  . Meniere disease, left 08/08/2017  . Sepsis due to Streptococcus pneumoniae ([place])   . Acute  respiratory failure with hypoxia ([place])   . Hemorrhagic shock ([place]) 07/02/2017  . Acute blood loss anemia 07/02/2017  . S/P AAA repair   . LLQ abdominal pain   . Ruptured abdominal aortic aneurysm (AAA) ([place])   . Hypotension   . Bacteremia due to Streptococcus 06/30/2017  . CVA (cerebral vascular accident) ([place]) 06/30/2017  . Chest pain 06/28/2017  . CAP (community acquired pneumonia): Clinical 06/28/2017  . Acute renal failure superimposed on stage 3 chronic kidney disease ([place]) 06/28/2017  . Dehydration 06/28/2017  . Leukocytosis 06/28/2017  . Elevated troponin 06/28/2017  . Dizziness 06/28/2017  . Vertigo 06/28/2017  . Acute pericarditis: Probable 06/28/2017    Past Surgical History:  Procedure Laterality Date  . ENDOVASCULAR STENT INSERTION N/A 07/01/2017   Procedure: ENDOVASCULAR STENT GRAFT INSERTION;  Surgeon: Serafina Mitchell, MD;  Location: [facility] OR;  Service: Vascular;  Laterality: N/A;  . THROMBECTOMY FEMORAL ARTERY Bilateral 07/01/2017   Procedure: Bilateral IlioFemoral Thrombectomies;  Surgeon: Serafina Mitchell, MD;  Location: [facility] OR;  Service: Vascular;  Laterality: Bilateral;        Home Medications    Prior to Admission medications   Medication Sig Start Date End Date Taking? Authorizing Provider  aspirin EC 81 MG EC tablet Take 1 tablet (81 mg total) by mouth daily. 07/13/17   Amin, Jeanella Flattery, MD  atorvastatin (LIPITOR) 40 MG tablet Take 1 tablet (40 mg total) by mouth daily at  6 PM. 07/12/17   Damita Lack, MD  cefTRIAXone (ROCEPHIN) IVPB Inject 2 g into the vein every 12 (twelve) hours. Indication:  Pneumococcal bacteremia and possible endovascular infection with CNS emboli Last Day of Therapy:  08/12/17 Labs - Once weekly:  CBC/D and BMP, Labs - Every other week:  ESR and CRP 07/12/17 08/19/17  Amin, Jeanella Flattery, MD  cefTRIAXone (ROCEPHIN) IVPB Inject 2 g into the vein every 12 (twelve) hours. Indication:  Bacteremia Last Day of Therapy:  May  19 Labs - Once  weekly:  CBC/D and BMP, Labs - Every other week:  ESR and CRP 07/14/17   Amin, Jeanella Flattery, MD  cyclobenzaprine (FLEXERIL) 10 MG tablet Take 1 tablet (10 mg total) by mouth 2 (two) times daily as needed for muscle spasms. 02/27/17   Khatri, Hina, PA-C  pantoprazole (PROTONIX) 40 MG tablet Take 1 tablet (40 mg total) by mouth daily at 12 noon. 07/12/17   Amin, Ankit Chirag, MD  senna-docusate (SENOKOT-S) 8.6-50 MG tablet Take 2 tablets by mouth at bedtime. 07/12/17   Damita Lack, MD    Family History No family history on file.  Social History Social History   Tobacco Use  . Smoking status: Former Smoker    Types: Cigarettes    Last attempt to quit: 07/16/2017    Years since quitting: 0.0  . Smokeless tobacco: Never Used  Substance Use Topics  . Alcohol use: No  . Drug use: No     Allergies   Patient has no known allergies.   Review of Systems Review of Systems 10 Systems reviewed and are negative for acute change except as noted in the HPI.   Physical Exam Updated Vital Signs BP (!) 152/93   Pulse (!) 57   Temp 98.1 F (36.7 C) (Oral)   Resp 14   Ht '5\' 7"'$  (1.702 m)   Wt 56.7 kg (125 lb)   SpO2 100%   BMI 19.58 kg/m   Physical Exam  Constitutional: He is oriented to person, place, and time. He appears well-developed and well-nourished.  Patient is a generally well clinical appearance.  He is alert.  His demeanor is interactive and expressive.  No respiratory distress at rest.  HENT:  Head: Normocephalic and atraumatic.  Mouth/Throat: Oropharynx is clear and moist.  Eyes: Pupils are equal, round, and reactive to light. EOM are normal.  Neck: Neck supple.  Cardiovascular: Normal rate, regular rhythm, normal heart sounds and intact distal pulses.  Pulmonary/Chest: Effort normal and breath sounds normal.  Abdominal: Soft. Bowel sounds are normal. He exhibits no distension. There is no tenderness.  Patient has well-healed inguinal surgical sites.  No erythema or  swelling.  Patient indicates the lower abdomen feels firm to him.  However it is nondistended and without guarding.  Patient does seem to have good abdominal wall musculature intact.  No obesity.  No palpable mass.  Musculoskeletal: Normal range of motion. He exhibits no edema.  Condition of lower extremities is good.  Patient does not have peripheral edema.  The calves are soft and nontender.  No wounds of the lower extremities.  Palpable dorsalis pedis pulse on the left 2+, right dorsalis pedis pulse difficult to palpate.  Present on handheld Doppler.  Right posterior tibial pulses easily palpable.  Both feet are warm to the touch.  Neurological: He is alert and oriented to person, place, and time. He has normal strength. Coordination normal. GCS eye subscore is 4. GCS verbal subscore is 5.  GCS motor subscore is 6.  Patient does have adequate strength for independently holding each leg off of the bed without assistance.  Dorsiflexion and plantar flexion are 5\5.  Skin: Skin is warm, dry and intact.  Psychiatric: He has a normal mood and affect.     ED Treatments / Results  Labs (all labs ordered are listed, but only abnormal results are displayed) Labs Reviewed  CBC - Abnormal; Notable for the following components:      Result Value   WBC 11.8 (*)    MCH 24.9 (*)    RDW 16.0 (*)    All other components within normal limits  DIFFERENTIAL - Abnormal; Notable for the following components:   Eosinophils Absolute 1.4 (*)    All other components within normal limits  COMPREHENSIVE METABOLIC PANEL - Abnormal; Notable for the following components:   Creatinine, Ser 1.41 (*)    Total Protein 8.9 (*)    GFR calc non Af Amer 52 (*)    GFR calc Af Amer 60 (*)    All other components within normal limits  CULTURE, BLOOD (ROUTINE X 2)  CULTURE, BLOOD (ROUTINE X 2)  PROTIME-INR  APTT  LIPASE, BLOOD  URINALYSIS, ROUTINE W REFLEX MICROSCOPIC  I-STAT TROPONIN, ED  I-STAT CG4 LACTIC ACID, ED     EKG None  Radiology Dg Chest 2 View  Result Date: 08/15/2017 CLINICAL DATA:  Patient admitted for stroke.  Weakness. EXAM: CHEST - 2 VIEW COMPARISON:  07/04/2017 FINDINGS: The heart size and mediastinal contours are within normal limits. Both lungs are clear. Subpleural scarring is seen along the periphery of the left lung base. Right-sided PICC line catheter terminates in the distal SVC. The visualized skeletal structures are unremarkable. IMPRESSION: No active cardiopulmonary disease. Electronically Signed   By: Ashley Royalty M.D.   On: 08/15/2017 14:18   Ct Head Wo Contrast  Result Date: 08/15/2017 CLINICAL DATA:  Recent stroke in April 2019. Difficulty walking without falling. Headache and lower extremity pain. EXAM: CT HEAD WITHOUT CONTRAST TECHNIQUE: Contiguous axial images were obtained from the base of the skull through the vertex without intravenous contrast. COMPARISON:  None. FINDINGS: Brain: No mass lesion, intraparenchymal hemorrhage or extra-axial collection. No evidence of acute cortical infarct. Bilateral old basal ganglia lacunar infarcts. There is periventricular hypoattenuation compatible with chronic microvascular disease. Vascular: Atherosclerotic calcification of the internal carotid arteries at the skull base. No hyperdense vessel. Skull: Normal visualized skull base, calvarium and extracranial soft tissues. Sinuses/Orbits: No fluid levels or advanced mucosal thickening of the visualized paranasal sinuses. No mastoid or middle ear effusion. The orbits are normal. IMPRESSION: Old bilateral basal ganglia lacunar infarcts and sequelae of chronic small vessel disease without acute intracranial abnormality. Electronically Signed   By: Ulyses Jarred M.D.   On: 08/15/2017 14:29   Ct Angio Abd/pel W And/or Wo Contrast  Result Date: 08/15/2017 CLINICAL DATA:  62 year old male with a history of ruptured abdominal aortic aneurysm, endovascular repair performed 07/01/2017 EXAM: CTA  ABDOMEN AND PELVIS wITHOUT AND WITH CONTRAST TECHNIQUE: Multidetector CT imaging of the abdomen and pelvis was performed using the standard protocol during bolus administration of intravenous contrast. Multiplanar reconstructed images and MIPs were obtained and reviewed to evaluate the vascular anatomy. CONTRAST:  100 cc  ISOVUE-370 IOPAMIDOL (ISOVUE-370) INJECTION 76% COMPARISON:  07/08/2017, 07/03/2017, 07/01/2017 FINDINGS: VASCULAR Aorta: Re- demonstration of endovascular repair of ruptured infrarenal abdominal aortic aneurysm, main device from the right and the contralateral gate from the left. Infrarenal fixation, with the  graft margin just below the lowest, left, renal artery. No evidence of a type 1 or a type 2 endoleak. Intense differential enhancement of the wall of the excluded aneurysm on the delayed images. Significantly decreased size of the retroperitoneal hematoma adjacent to the psoas muscle. Greatest diameter today measures 3.0 cm x 3.4 cm on the axial images. Fluid extends inferiorly in a cone like configuration lateral to the left psoas muscle overlying the iliacus. Near complete resolution of hematoma surrounding the left iliac vasculature. The right limb terminates within the common iliac artery. The left limb terminates within the left common iliac artery. Greatest diameter of the excluded aneurysm sac measures 7.2 cm (image 23 of series 15), which is larger than the comparison CT studies. Interval resolution of gas within the aneurysm sac. Celiac: Celiac arteries patent. No significant atherosclerotic changes at the origin. Traditional branch pattern of the celiac artery. Accessory left hepatic artery. SMA: Superior mesenteric artery is patent with no significant atherosclerotic changes at the origin. Renals: Main left and right renal arteries are patent. Again, irregularity at the proximal right renal artery may represent a fusiform aneurysm, with associated stenosis. Small accessory left  renal artery above the main renal artery. Main renal artery appears patent, just above the proximal stent graft. IMA: IMA appears occluded at the origin. Left colic artery and superior rectal artery are patent via collateral flow. Right lower extremity: The right iliac limb is patent, with near complete interval resolution of the mural thrombus/thickening, with maintained flow channel. No evidence of a type 1 B endoleak. The hypogastric artery appears occluded in the interval. External iliac artery is patent. Surgical changes at the right common femoral artery. Proximal profunda femoris and superficial femoral artery patent. Left lower extremity: Left iliac limb is patent. No evidence of a type 1 B endoleak. Left hypogastric artery and external iliac artery are patent. Proximal SFA and profunda femoris patent. Veins: Unremarkable appearance of the venous system. Review of the MIP images confirms the above findings. NON-VASCULAR Lower chest: Evidence of mild interlobular septal thickening. Linear opacities at the lung bases. Calcified granuloma of the right middle lobe. Hepatobiliary: Unremarkable appearance of the liver. Hyperdense material within the gallbladder lumen compatible with cholelithiasis. No evidence of associated inflammatory changes. Pancreas: Unremarkable pancreas Spleen: Unremarkable spleen Adrenals/Urinary Tract: Enhancing lesion of the left adrenal gland. Unremarkable appearance of the right adrenal gland. Right: No right-sided hydronephrosis. Similar appearance of cystic lesion at the superior right kidney. No nephrolithiasis. Unremarkable course the right ureter. Left: No left-sided hydronephrosis. 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 Unremarkable course the left ureter. Urinary bladder partially distended. Stomach/Bowel: Unremarkable stomach . Unremarkable small bowel. No abnormal distention. Moderate stool burden. No transition point. No inflammatory changes. Normal appendix.  Colonic diverticular present without associated inflammation. No inflammatory changes within the small bowel mesenteric. Lymphatic: No lymphadenopathy. Reproductive: Unremarkable appearance of the pelvic organs. Other: Small fat containing umbilical hernia. Musculoskeletal: No acute displaced fracture. Mild degenerative changes of the spine. IMPRESSION: Re-demonstration of endovascular repair of ruptured infrarenal abdominal aortic aneurysm. No evidence of endoleak or stent graft migration. The aneurysm sac demonstrates no internal enhancement or continued flow, however, there is intense mural enhancement on the delayed images, and the greatest diameter of the aneurysm sac has enlarged to a greatest diameter of 7.2 cm, previously 6.6 cm on presentation. The etiology of the enlargement is uncertain, potentially reactive or post inflammatory. Although gas has resolved in the interval, seen on the prior and most likely  postoperative changes, infection cannot be excluded. There has been near complete resolution of the right limb mural thrombus, with patency maintained of the bilateral iliac limbs. Interval occlusion of the right hypogastric artery. Left hypogastric artery remains patent. Interval improvement of retroperitoneal hematoma, with near complete resolution adjacent to the iliac vasculature, and small persisting fluid collection lateral to the left psoas muscle. These results were called by telephone at the time of interpretation on 08/15/2017 at 4:09 pm to Dr. Jeannie Done Egbert Seidel. 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 Correlation with duplex may be useful. Cholelithiasis. Evidence of mild pulmonary edema. Signed, Dulcy Fanny. Earleen Newport, DO [facility] Radiology Electronically Signed   By: Corrie Mckusick D.O.   On: 08/15/2017 16:09    Procedures Procedures (including critical care  time)  Medications Ordered in ED Medications  iopamidol (ISOVUE-370) 76 % injection (100 mLs Intravenous Contrast Given 08/15/17 1501)     Initial Impression / Assessment and Plan / ED Course  I have reviewed the triage vital signs and the nursing notes.  Pertinent labs & imaging results that were available during my care of the patient were reviewed by me and considered in my medical decision making (see chart for details).    Consult: (16: 20) vascular surgery consulted.  Dr. Donnetta Hutching is in the OR.  Description conveyed by OR nurse.  Vascular surgery will perform consultation in the emergency department.  Final Clinical Impressions(s) / ED Diagnoses   Final diagnoses:  Postoperative surgical complication involving circulatory system associated with non-cardiac procedure, unspecified complication  Weakness of both lower extremities  Malaise and fatigue   Patient has indolent course of generalized weakness and malaise with lower extremity weakness and difficulty walking.  Presentation is not suggestive of strokelike symptoms.  CT head obtained without acute findings.  There were no associated upper extremity symptoms or cognitive\visual\speech complaints.  CT is obtained of the pelvis which per radiology has some concerning findings of significant mural thickening and increased diameter of the aneurysmal sac.  This raised the question of possible infection.  Patient is nontoxic.  Vital signs are stable.  He has mild leukocytosis.  At this time, plan will be to await consultation by vascular surgery to determine if admission or further diagnostic testing will be indicated based on this finding.  There has done consultation.  At this time, they do not feel this is a clear complication of the vascular surgery.  They advised unclear as to the etiology of the patient's gait difficulty.  Discussed plan.  Plan will be to admit to hospitalist service for observation with blood cultures and PT/OT  consultation for strength and gait testing.  Vascular will continue consult as needed. ED Discharge Orders    None       Charlesetta Shanks, MD 08/15/17 1636    Charlesetta Shanks, MD 08/15/17 [phone]

## 2017-08-15 NOTE — Consult Note (Addendum)
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 Johnney Killian MRN #:  [id number]  History of Present Illness: This is a 62 y.o. male s/p emergent endovascular repair of ruptured abdominal aortic aneurysm with subsequent bilateral iliofemoral endarterectomy and thromboembolectomy by Dr. Trula Slade on 07/01/17.  At the time of rupture, patient was admitted to the hospital and being treated for infective pericarditis.  He has since finished 6 weeks of rocephin via R arm PICC line with last dose on 08/12/17.  He is seen in consultation in the emergency department due to 3 day history of weakness BLE as well as some pain in hips, pelvis, and groin incision sites.  CTA abd/pelvis demonstrates interval improvement in retorperitoneal hematoma, patent endograft without endoleaks however aneurysm sac has enlarged with mural enhancement.  He denies subjective fevers, chills. He has had trouble ambulating however is vague with symptoms.  He denies claudication, rest pain, or any nonhealing wounds.  He is taking aspirin.  Past Medical History:  Diagnosis Date  . Hypertension   . Stroke [facility])     Past Surgical History:  Procedure Laterality Date  . ENDOVASCULAR STENT INSERTION N/A 07/01/2017   Procedure: ENDOVASCULAR STENT GRAFT INSERTION;  Surgeon: Serafina Mitchell, MD;  Location: [facility] OR;  Service: Vascular;  Laterality: N/A;  . THROMBECTOMY FEMORAL ARTERY Bilateral 07/01/2017   Procedure: Bilateral IlioFemoral Thrombectomies;  Surgeon: Serafina Mitchell, MD;  Location: [facility] OR;  Service: Vascular;  Laterality: Bilateral;    No Known Allergies  Prior to Admission medications   Medication Sig Start Date End Date Taking? Authorizing Provider  aspirin EC 81 MG EC tablet Take 1 tablet (81 mg total) by mouth daily. 07/13/17   Amin, Jeanella Flattery, MD  atorvastatin (LIPITOR) 40 MG tablet Take 1 tablet (40 mg total) by mouth daily at 6 PM. 07/12/17   Amin, Jeanella Flattery, MD   cefTRIAXone (ROCEPHIN) IVPB Inject 2 g into the vein every 12 (twelve) hours. Indication:  Pneumococcal bacteremia and possible endovascular infection with CNS emboli Last Day of Therapy:  08/12/17 Labs - Once weekly:  CBC/D and BMP, Labs - Every other week:  ESR and CRP 07/12/17 08/19/17  Amin, Jeanella Flattery, MD  cefTRIAXone (ROCEPHIN) IVPB Inject 2 g into the vein every 12 (twelve) hours. Indication:  Bacteremia Last Day of Therapy:  May  19 Labs - Once weekly:  CBC/D and BMP, Labs - Every other week:  ESR and CRP 07/14/17   Amin, Jeanella Flattery, MD  cyclobenzaprine (FLEXERIL) 10 MG tablet Take 1 tablet (10 mg total) by mouth 2 (two) times daily as needed for muscle spasms. 02/27/17   Khatri, Hina, PA-C  pantoprazole (PROTONIX) 40 MG tablet Take 1 tablet (40 mg total) by mouth daily at 12 noon. 07/12/17   Amin, Ankit Chirag, MD  senna-docusate (SENOKOT-S) 8.6-50 MG tablet Take 2 tablets by mouth at bedtime. 07/12/17   Damita Lack, MD    Social History   Socioeconomic History  . Marital status: Divorced    Spouse name: Not on file  . Number of children: Not on file  . Years of education: Not on file  . Highest education level: Not on file  Occupational History  . Not on file  Social Needs  . Financial resource strain: Not on file  . Food insecurity:    Worry: Not on file    Inability: Not on file  . Transportation needs:    Medical: Not on  file    Non-medical: Not on file  Tobacco Use  . Smoking status: Former Smoker    Types: Cigarettes    Last attempt to quit: 07/16/2017    Years since quitting: 0.0  . Smokeless tobacco: Never Used  Substance and Sexual Activity  . Alcohol use: No  . Drug use: No  . Sexual activity: Not on file  Lifestyle  . Physical activity:    Days per week: Not on file    Minutes per session: Not on file  . Stress: Not on file  Relationships  . Social connections:    Talks on phone: Not on file    Gets together: Not on file    Attends religious  service: Not on file    Active member of club or organization: Not on file    Attends meetings of clubs or organizations: Not on file    Relationship status: Not on file  . Intimate partner violence:    Fear of current or ex partner: Not on file    Emotionally abused: Not on file    Physically abused: Not on file    Forced sexual activity: Not on file  Other Topics Concern  . Not on file  Social History Narrative  . Not on file     No family history on file.  ROS: Otherwise negative unless mentioned in HPI  Physical Examination  Vitals:   08/15/17 1230 08/15/17 1330  BP: (!) 155/81 (!) 152/93  Pulse: (!) 54 (!) 57  Resp:    Temp:    SpO2: 99% 100%   Body mass index is 19.58 kg/m.  General:  WDWN in NAD Gait: Not observed HENT: WNL, normocephalic Pulmonary: normal non-labored breathing, without Rales, rhonchi,  wheezing Cardiac: regular Abdomen: soft, nontender to deep palpation Skin: without rashes Vascular Exam/Pulses: palpable and symmetrical DP pulses, symmetrical radial pulses Extremities: without ischemic changes, without Gangrene , without cellulitis; without open wounds;  Musculoskeletal: no muscle wasting or atrophy  Neurologic: A&O X 3;  No focal weakness or paresthesias are detected; speech is fluent/normal Psychiatric:  The pt has Normal affect. Lymph:  Unremarkable  CBC    Component Value Date/Time   WBC 11.8 (H) 08/15/2017 1016   RBC 5.35 08/15/2017 1016   HGB 13.3 08/15/2017 1016   HCT 42.2 08/15/2017 1016   PLT 383 08/15/2017 1016   MCV 78.9 08/15/2017 1016   MCV 77.8 (A) 06/28/2017 1335   MCH 24.9 (L) 08/15/2017 1016   MCHC 31.5 08/15/2017 1016   RDW 16.0 (H) 08/15/2017 1016   LYMPHSABS 3.5 08/15/2017 1016   MONOABS 0.6 08/15/2017 1016   EOSABS 1.4 (H) 08/15/2017 1016   BASOSABS 0.1 08/15/2017 1016    BMET    Component Value Date/Time   NA 138 08/15/2017 1016   K 4.2 08/15/2017 1016   CL 103 08/15/2017 1016   CO2 26 08/15/2017  1016   GLUCOSE 95 08/15/2017 1016   BUN 18 08/15/2017 1016   CREATININE 1.41 (H) 08/15/2017 1016   CALCIUM 9.5 08/15/2017 1016   GFRNONAA 52 (L) 08/15/2017 1016   GFRAA 60 (L) 08/15/2017 1016    COAGS: Lab Results  Component Value Date   INR 1.01 08/15/2017   INR 1.04 07/01/2017   INR 1.23 06/29/2017     Non-Invasive Vascular Imaging:   IMPRESSION: Re-demonstration of endovascular repair of ruptured infrarenal abdominal aortic aneurysm. No evidence of endoleak or stent graft migration.  The aneurysm sac demonstrates no internal enhancement or  continued flow, however, there is intense mural enhancement on the delayed images, and the greatest diameter of the aneurysm sac has enlarged to a greatest diameter of 7.2 cm, previously 6.6 cm on presentation. The etiology of the enlargement is uncertain, potentially reactive or post inflammatory. Although gas has resolved in the interval, seen on the prior and most likely postoperative changes, infection cannot be excluded.  There has been near complete resolution of the right limb mural thrombus, with patency maintained of the bilateral iliac limbs. Interval occlusion of the right hypogastric artery. Left hypogastric artery remains patent.  Interval improvement of retroperitoneal hematoma, with near complete resolution adjacent to the iliac vasculature, and small persisting fluid collection lateral to the left psoas muscle.  These results were called by telephone at the time of interpretation on 08/15/2017 at 4:09 pm to Dr. Jeannie Done PFEIFFER.  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 Correlation with duplex may be useful.  Cholelithiasis.  Evidence of mild pulmonary edema.  Statin:  Yes.   Beta Blocker:  No. Aspirin:  Yes.   ACEI:  No. ARB:  No. CCB use:  No Other antiplatelets/anticoagulants:  No.    ASSESSMENT/PLAN: This  is a 62 y.o. male 6 weeks s/p emergent endovascular repair of ruptured AAA and BLE thrombectomies seen in consultation for concern for possible endograft infection   Patent endograft with palpable pedal pulses Unable to elicit abdominal pain with palpation on exam CTA demonstrating likely reactive, post operative inflammatory changes to aneurysm sac No obvious sign of graft infection and no indication for surgical intervention at this time however if admitted for observation, vascular will continue to follow with you Dr. Donzetta Matters also involved in the evaluation of the patient  Dagoberto Ligas PA-C [facility] [phone]    I have interviewed and examined patient with PA and agree with assessment and plan above.   Brandon C. Donzetta Matters, MD [facility] [place] Office: [phone] Pager: [phone]

## 2017-08-15 NOTE — ED Notes (Signed)
Admitting provider bedside at this time.

## 2017-08-16 ENCOUNTER — Observation Stay (HOSPITAL_COMMUNITY): Payer: Private Health Insurance - Indemnity

## 2017-08-16 ENCOUNTER — Encounter (HOSPITAL_COMMUNITY): Payer: Self-pay | Admitting: Radiology

## 2017-08-16 ENCOUNTER — Observation Stay (HOSPITAL_BASED_OUTPATIENT_CLINIC_OR_DEPARTMENT_OTHER): Payer: Private Health Insurance - Indemnity

## 2017-08-16 DIAGNOSIS — I10 Essential (primary) hypertension: Secondary | ICD-10-CM

## 2017-08-16 DIAGNOSIS — R2681 Unsteadiness on feet: Secondary | ICD-10-CM | POA: Diagnosis not present

## 2017-08-16 DIAGNOSIS — G8220 Paraplegia, unspecified: Secondary | ICD-10-CM | POA: Diagnosis not present

## 2017-08-16 DIAGNOSIS — I63343 Cerebral infarction due to thrombosis of bilateral cerebellar arteries: Secondary | ICD-10-CM

## 2017-08-16 DIAGNOSIS — I6501 Occlusion and stenosis of right vertebral artery: Secondary | ICD-10-CM | POA: Diagnosis not present

## 2017-08-16 DIAGNOSIS — I351 Nonrheumatic aortic (valve) insufficiency: Secondary | ICD-10-CM | POA: Diagnosis not present

## 2017-08-16 DIAGNOSIS — I6340 Cerebral infarction due to embolism of unspecified cerebral artery: Secondary | ICD-10-CM

## 2017-08-16 LAB — URINALYSIS, ROUTINE W REFLEX MICROSCOPIC
Bilirubin Urine: NEGATIVE
Glucose, UA: NEGATIVE mg/dL
Hgb urine dipstick: NEGATIVE
Ketones, ur: NEGATIVE mg/dL
Leukocytes, UA: NEGATIVE
Nitrite: NEGATIVE
Protein, ur: NEGATIVE mg/dL
Specific Gravity, Urine: 1.018 (ref 1.005–1.030)
pH: 5 (ref 5.0–8.0)

## 2017-08-16 LAB — LIPID PANEL
Cholesterol: 174 mg/dL (ref 0–200)
HDL: 40 mg/dL — ABNORMAL LOW
LDL Cholesterol: 115 mg/dL — ABNORMAL HIGH (ref 0–99)
Total CHOL/HDL Ratio: 4.4 ratio
Triglycerides: 94 mg/dL
VLDL: 19 mg/dL (ref 0–40)

## 2017-08-16 LAB — BASIC METABOLIC PANEL
Anion gap: 7 (ref 5–15)
BUN: 15 mg/dL (ref 6–20)
CHLORIDE: 106 mmol/L (ref 101–111)
CO2: 25 mmol/L (ref 22–32)
CREATININE: 1.27 mg/dL — AB (ref 0.61–1.24)
Calcium: 9 mg/dL (ref 8.9–10.3)
GFR calc Af Amer: >60 mL/min (ref >60)
GFR calc non Af Amer: 58 mL/min — ABNORMAL LOW (ref >60)
Glucose, Bld: 86 mg/dL (ref 65–99)
Potassium: 4 mmol/L (ref 3.5–5.1)
SODIUM: 138 mmol/L (ref 135–145)

## 2017-08-16 LAB — CBC
HCT: 37.9 % — ABNORMAL LOW (ref 39.0–52.0)
Hemoglobin: 12.2 g/dL — ABNORMAL LOW (ref 13.0–17.0)
MCH: 25.2 pg — AB (ref 26.0–34.0)
MCHC: 32.2 g/dL (ref 30.0–36.0)
MCV: 78.1 fL (ref 78.0–100.0)
PLATELETS: 356 10*3/uL (ref 150–400)
RBC: 4.85 MIL/uL (ref 4.22–5.81)
RDW: 15.9 % — AB (ref 11.5–15.5)
WBC: 11.9 10*3/uL — ABNORMAL HIGH (ref 4.0–10.5)

## 2017-08-16 LAB — ECHOCARDIOGRAM COMPLETE
Height: 67 in
WEIGHTICAEL: 2000 [oz_av]

## 2017-08-16 MED ORDER — STROKE: EARLY STAGES OF RECOVERY BOOK
Freq: Once | Status: AC
  Administered 2017-08-16: 09:00:00

## 2017-08-16 MED ORDER — ATORVASTATIN CALCIUM 80 MG PO TABS
80.0000 mg | ORAL_TABLET | Freq: Every day | ORAL | Status: DC
  Administered 2017-08-16 – 2017-08-17 (×2): 80 mg via ORAL
  Filled 2017-08-16 (×2): qty 1

## 2017-08-16 MED ORDER — CLOPIDOGREL BISULFATE 75 MG PO TABS
75.0000 mg | ORAL_TABLET | Freq: Every day | ORAL | Status: DC
  Administered 2017-08-16 – 2017-08-17 (×2): 75 mg via ORAL
  Filled 2017-08-16 (×2): qty 1

## 2017-08-16 MED ORDER — IOPAMIDOL (ISOVUE-370) INJECTION 76%
50.0000 mL | Freq: Once | INTRAVENOUS | Status: AC
  Administered 2017-08-17: 100 mL via INTRAVENOUS

## 2017-08-16 MED ORDER — ASPIRIN EC 81 MG PO TBEC
81.0000 mg | DELAYED_RELEASE_TABLET | Freq: Every day | ORAL | Status: DC
  Administered 2017-08-16: 81 mg via ORAL
  Filled 2017-08-16: qty 1

## 2017-08-16 MED ORDER — IOPAMIDOL (ISOVUE-370) INJECTION 76%
INTRAVENOUS | Status: AC
  Filled 2017-08-16: qty 50

## 2017-08-16 MED ORDER — ASPIRIN EC 325 MG PO TBEC
325.0000 mg | DELAYED_RELEASE_TABLET | Freq: Every day | ORAL | Status: DC
  Administered 2017-08-17: 325 mg via ORAL
  Filled 2017-08-16: qty 1

## 2017-08-16 MED ORDER — LABETALOL HCL 5 MG/ML IV SOLN: 10.0000 mg | INTRAVENOUS | Status: DC | PRN

## 2017-08-16 MED ORDER — IOPAMIDOL (ISOVUE-370) INJECTION 76%
50.0000 mL | Freq: Once | INTRAVENOUS | Status: AC | PRN
  Administered 2017-08-16: 50 mL via INTRAVENOUS

## 2017-08-16 MED ORDER — IOPAMIDOL (ISOVUE-370) INJECTION 76%: 50.0000 mL | Freq: Once | INTRAVENOUS | Status: DC | PRN

## 2017-08-16 MED ORDER — LACTATED RINGERS IV SOLN
INTRAVENOUS | Status: AC
  Administered 2017-08-16 – 2017-08-17 (×2): via INTRAVENOUS

## 2017-08-16 NOTE — Evaluation (Signed)
Physical Therapy Evaluation Patient Details Name: Thomas [name] MRN: [id number] DOB: 1955-10-05 Today's Date: 08/16/2017   History of Present Illness   62 yo M with hx of CKD stage III,  CVA, s/p emergent repair ruptured AAA, s/p bilateral iliofemoral endarterectomy and thromboembolectomy on 4/7 representing with difficulty ambulation, pelvic pain, and headache; CT negative; MRI=Acute subcentimeter infarcts bilateral cerebella and RIGHT  Clinical Impression  Pt admitted with above diagnosis. Pt currently with functional limitations due to the deficits listed below (see PT Problem List).  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       Follow Up Recommendations CIR    Equipment Recommendations  Rolling walker with 5" wheels    Recommendations for Other Services       Precautions / Restrictions Precautions Precautions: Fall      Mobility  Bed Mobility Overal bed mobility: Needs Assistance Bed Mobility: Supine to Sit     Supine to sit: Supervision     General bed mobility comments: for safety and line management  Transfers Overall transfer level: Needs assistance Equipment used: Rolling walker (2 wheeled) Transfers: Sit to/from Stand Sit to Stand: Min assist         General transfer comment: min assist for anterior-superior wt shift and to stabilize once in standing with RW support  Ambulation/Gait Ambulation/Gait assistance: Min assist;Mod assist Ambulation Distance (Feet): 12 Feet(in room) Assistive device: Rolling walker (2 wheeled) Gait Pattern/deviations: Step-through pattern;Decreased stride length;Narrow base of support     General Gait Details:  assist for balance and to maneuver RW in room, multi-modal cues for step length, RW position, heavy use of UEs  Stairs            Wheelchair Mobility    Modified Rankin (Stroke Patients Only) Modified Rankin (Stroke Patients Only) Pre-Morbid Rankin Score: No symptoms Modified Rankin: Moderately severe disability     Balance Overall balance assessment: Needs assistance Sitting-balance support: No upper extremity supported;Feet supported Sitting balance-Leahy Scale: Fair       Standing balance-Leahy Scale: Poor Standing balance comment: heavily reliant on UE support for static stand; reliant on UEs and external assist for dynamic balance                             Pertinent Vitals/Pain Pain Assessment: Faces Faces Pain Scale: Hurts even more Pain Location: pelvic region Pain Descriptors / Indicators: Aching Pain Intervention(s): Monitored during session    Home Living Family/patient expects to be discharged to:: Private residence Living Arrangements: Other relatives(cousin ) Available Help at Discharge: Family;Friend(s);Available PRN/intermittently Type of Home: House Home Access: Stairs to enter Entrance Stairs-Rails: None Entrance Stairs-Number of Steps: 3 Home Layout: One level Home Equipment: Cane - single point Additional Comments: also has local dtr that checks on him, 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, sometimes other hours    Prior Function Level of Independence: Independent         Comments: works in [employer] [place]: Right    Extremity/Trunk Assessment   Upper Extremity Assessment Upper Extremity Assessment: Defer to OT evaluation    Lower  Extremity Assessment Lower Extremity Assessment: RLE deficits/detail;LLE deficits/detail RLE Deficits / Details: hip 3+/5, knee 4/5;  ankle 4/5 AROM WFL RLE Coordination: decreased gross motor LLE Deficits / Details: strength grossly WFL; AROM WFL LLE  Coordination: decreased gross motor       Communication   Communication: No difficulties  Cognition Arousal/Alertness: Awake/alert Behavior During Therapy: WFL for tasks assessed/performed Overall Cognitive Status: Within Functional Limits for tasks assessed                                        General Comments      Exercises     Assessment/Plan    PT Assessment Patient needs continued PT services  PT Problem List Decreased strength;Decreased mobility;Decreased activity tolerance;Decreased balance;Decreased coordination       PT Treatment Interventions DME instruction;Gait training;Functional mobility training;Therapeutic activities;Therapeutic exercise;Patient/family education;Neuromuscular re-education;Balance training    PT Goals (Current goals can be found in the Care Plan section)  Acute Rehab PT Goals Patient Stated Goal: to get better PT Goal Formulation: With patient Time For Goal Achievement: 08/30/17 Potential to Achieve Goals: Good    Frequency Min 4X/week   Barriers to discharge        Co-evaluation               AM-PAC PT "6 Clicks" Daily Activity  Outcome Measure Difficulty turning over in bed (including adjusting bedclothes, sheets and blankets)?: A Little Difficulty moving from lying on back to sitting on the side of the bed? : A Little Difficulty sitting down on and standing up from a chair with arms (e.g., wheelchair, bedside commode, etc,.)?: A Little Help needed moving to and from a bed to chair (including a wheelchair)?: A Lot Help needed walking in hospital room?: A Lot Help needed climbing 3-5 steps with a railing? : A Lot 6 Click Score: 15    End of Session Equipment Utilized During Treatment: Gait belt Activity Tolerance: Patient tolerated treatment well Patient left: in chair;with call bell/phone within reach;with chair alarm set(no alarm box, RN made aware)   PT Visit Diagnosis: Difficulty in walking, not  elsewhere classified (R26.2);Hemiplegia and hemiparesis Hemiplegia - Right/Left: Right Hemiplegia - dominant/non-dominant: Dominant Hemiplegia - caused by: Cerebral infarction    Time: 3474-2595 PT Time Calculation (min) (ACUTE ONLY): 19 min   Charges:   PT Evaluation $PT Eval Moderate Complexity: 1 Mod     PT G CodesKenyon Ana, PT Pager: [phone] 08/16/2017   [facility] 08/16/2017, 11:11 AM

## 2017-08-16 NOTE — Progress Notes (Addendum)
  Progress Note    08/16/2017 8:07 AM Hospital Day 1  Subjective:  No complaints  Afebrile HR  60's-100  283'T-517'O systolic 16% RA  Vitals:   08/15/17 2200 08/16/17 0522  BP: (!) 141/94 119/79  Pulse: 68 100  Resp:  18  Temp:  98.2 F (36.8 C)  SpO2: 99% 95%    Physical Exam: Cardiac:  regular Lungs:  Non labored Abdomen:  Soft, NT/ND Extremities:  Easily palpable DP and radial pulses bilaterally.   CBC    Component Value Date/Time   WBC 11.9 (H) 08/16/2017 0625   RBC 4.85 08/16/2017 0625   HGB 12.2 (L) 08/16/2017 0625   HCT 37.9 (L) 08/16/2017 0625   PLT 356 08/16/2017 0625   MCV 78.1 08/16/2017 0625   MCV 77.8 (A) 06/28/2017 1335   MCH 25.2 (L) 08/16/2017 0625   MCHC 32.2 08/16/2017 0625   RDW 15.9 (H) 08/16/2017 0625   LYMPHSABS 3.5 08/15/2017 1016   MONOABS 0.6 08/15/2017 1016   EOSABS 1.4 (H) 08/15/2017 1016   BASOSABS 0.1 08/15/2017 1016    BMET    Component Value Date/Time   NA 138 08/16/2017 0625   K 4.0 08/16/2017 0625   CL 106 08/16/2017 0625   CO2 25 08/16/2017 0625   GLUCOSE 86 08/16/2017 0625   BUN 15 08/16/2017 0625   CREATININE 1.27 (H) 08/16/2017 0625   CALCIUM 9.0 08/16/2017 0625   GFRNONAA 58 (L) 08/16/2017 0625   GFRAA >60 08/16/2017 0625    INR    Component Value Date/Time   INR 1.01 08/15/2017 1016     Intake/Output Summary (Last 24 hours) at 08/16/2017 0807 Last data filed at 08/16/2017 0440 Gross per 24 hour  Intake 500.2 ml  Output 475 ml  Net 25.2 ml     Assessment/Plan:  62 y.o. male who underwent endovascular repair of ruptured AAA and BLE thrombectomies who is admitted with concern for possible endograft infection Hospital Day 1  -on physical exam, abdomen is non tender to palpation and soft. He continues to have palpable DP pulses and radial pulses.   -leukocytosis stable from admission -CTA demonstrating likely reactive, post op inflammatory changes to aneurysm sac.  There is no obvious sign of  infection.  No indication for surgical intervention at this time.    Leontine Locket, PA-C [facility] [phone] 08/16/2017 8:07 AM  I agree with the above.  I have reviewed his CT scan and think that this likely represents inflamatory changes around his AAA repair.  Even if it is infection, I would try to treat this with IV abx.  No surgical intervention recommended at this time.  His inability to walk does not appear to be vascular in origin as he has palpable pedal pulses  Thomas [name]

## 2017-08-16 NOTE — Evaluation (Signed)
Speech Language Pathology Evaluation Patient Details Name: Thomas [name] MRN: [id number] DOB: September 17, 1955 Today's Date: 08/16/2017 Time: 47-1750 SLP Time Calculation (min) (ACUTE ONLY): 15 min  Problem List:  Patient Active Problem List   Diagnosis Date Noted  . Cerebral embolism with cerebral infarction 08/16/2017  . Paraparesis ([place])   . Hypertension 08/15/2017  . Gait instability 08/15/2017  . Pelvic pain 08/15/2017  . Chronic kidney disease 08/15/2017  . Abnormal CT scan, pelvis 08/15/2017  . Meniere disease, left 08/08/2017  . Sepsis due to Streptococcus pneumoniae ([place])   . Acute respiratory failure with hypoxia ([place])   . Hemorrhagic shock ([place]) 07/02/2017  . Acute blood loss anemia 07/02/2017  . S/P AAA repair   . LLQ abdominal pain   . Ruptured abdominal aortic aneurysm (AAA) ([place])   . Hypotension   . Bacteremia due to Streptococcus 06/30/2017  . CVA (cerebral vascular accident) ([place]) 06/30/2017  . Chest pain 06/28/2017  . CAP (community acquired pneumonia): Clinical 06/28/2017  . Acute renal failure superimposed on stage 3 chronic kidney disease ([place]) 06/28/2017  . Dehydration 06/28/2017  . Leukocytosis 06/28/2017  . Elevated troponin 06/28/2017  . Dizziness 06/28/2017  . Vertigo 06/28/2017  . Acute pericarditis: Probable 06/28/2017   Past Medical History:  Past Medical History:  Diagnosis Date  . Hypertension   . Stroke [facility])    Past Surgical History:  Past Surgical History:  Procedure Laterality Date  . ENDOVASCULAR STENT INSERTION N/A 07/01/2017   Procedure: ENDOVASCULAR STENT GRAFT INSERTION;  Surgeon: Serafina Mitchell, MD;  Location: [facility] OR;  Service: Vascular;  Laterality: N/A;  . THROMBECTOMY FEMORAL ARTERY Bilateral 07/01/2017   Procedure: Bilateral IlioFemoral Thrombectomies;  Surgeon: Serafina Mitchell, MD;  Location: [facility] OR;  Service: Vascular;  Laterality: Bilateral;   HPI:  Thomas [name] a very pleasant61 y.o.malewith medical history significantfor  chronic kidney disease stage III bacteremia due to Streptococcus CVA, ruptured abdominal aortic aneurysm status post repair presents to the emergency Department chief complaint of pelvic pain and gait instability. Initial evaluation includes CTA abdomen and pelvis without hematoma weeks but reveals enlarged aneurysm sac. MRI reveals acute subcentimeter infarcts bilateral cerebella and RIGHT cervicomedullary junctionas well as old bilateral basal ganglia and thalami lacunar infarcts and  old small cerebellar infarcts.   Assessment / Plan / Recommendation Clinical Impression  Pt presents with mild higher level cognitive deficits impacting alternating attention in moderately distracting environment, semi-complex problem solving and overall insight into deficits. Pt was independently managing his medication and money prior to recent hospitalization. Recommend skilled ST to address the above mentioned deficits at his next venue of care. Continue to recommend CIR for intensive therapy to increase functional independence, decrease risk of rehospitalization and decreased caregiver burden. ST to sign off acutely.     SLP Assessment  SLP Recommendation/Assessment: All further Speech Lanaguage Pathology  needs can be addressed in the next venue of care SLP Visit Diagnosis: Cognitive communication deficit (R41.841)    Follow Up Recommendations  Inpatient Rehab    Frequency and Duration           SLP Evaluation Cognition  Overall Cognitive Status: Impaired/Different from baseline Arousal/Alertness: Awake/alert Orientation Level: Oriented X4 Attention: Alternating Alternating Attention: Impaired Alternating Attention Impairment: Verbal basic;Functional basic Memory: Impaired Memory Impairment: Decreased recall of new information Awareness: Impaired Awareness Impairment: Anticipatory impairment Problem Solving: Impaired Problem Solving Impairment: Verbal complex;Functional complex Safety/Judgment:  Appears intact       Comprehension  Auditory Comprehension Overall Auditory Comprehension: Appears within  functional limits for tasks assessed Visual Recognition/Discrimination Discrimination: Not tested(visual deficits)    Expression Expression Primary Mode of Expression: Verbal Verbal Expression Overall Verbal Expression: Appears within functional limits for tasks assessed Written Expression Dominant Hand: Right Written Expression: Not tested   Oral / Motor  Oral Motor/Sensory Function Overall Oral Motor/Sensory Function: Within functional limits Motor Speech Overall Motor Speech: Appears within functional limits for tasks assessed Articulation: Within functional limitis Intelligibility: Intelligible Motor Planning: Witnin functional limits Motor Speech Errors: Not applicable   GO                    [name] 08/16/2017, 6:38 PM

## 2017-08-16 NOTE — Progress Notes (Signed)
  Echocardiogram 2D Echocardiogram has been performed.  Thomas [name] T Lukka Black 08/16/2017, 3:21 PM

## 2017-08-16 NOTE — Progress Notes (Signed)
Inpatient Rehabilitation  Per PT request, patient was screened by Gunnar Fusi for appropriateness for an Inpatient Acute Rehab consult.  At this time we are recommending an Inpatient Rehab consult.  Text paged MD; please order if you are agreeable.    Carmelia Roller., CCC/SLP Admission Coordinator  [place]  Cell [phone]

## 2017-08-16 NOTE — Care Management Note (Signed)
Case Management Note  Patient Details  Name: Thomas [name] MRN: [id number] Date of Birth: 01/07/1956  Subjective/Objective:   Pt admitted with CVA. He is from home.   Action/Plan: PT recommending CIR. Awaiting OT eval. CM following for d/c disposition.   Expected Discharge Date:                  Expected Discharge Plan:     In-House Referral:     Discharge planning Services     Post Acute Care Choice:    Choice offered to:     DME Arranged:    DME Agency:     HH Arranged:    HH Agency:     Status of Service:  In process, will continue to follow  If discussed at Long Length of Stay Meetings, dates discussed:    Additional Comments:  [name], RN 08/16/2017, 2:43 PM

## 2017-08-16 NOTE — Consult Note (Addendum)
Referring Physician: Elodia Florence., *    Chief Complaint: Leg Pain   HPI: Thomas [name] is an 62 y.o. male with a past medical history of Tobacco abuse,  CKD stage III, recent admission for bacteremia secondary to strep pneumonia, infective pericarditis and finished a 6-week course of Rocephin, ruptured AAA status post endovascular repair of AAA with post op complications of loss of RLE numbness and mild right foot  weaknes requiring  iliofemoral endarterectomy and embolectomy- symptoms were persistent with PVD, 3 left cerebellar infarct, old small bilateral cerebellar infarcts as well as old BG and thalamus infarcts with symptoms at the time of dizziness, right lower extremity weakness and numbness who presented to the hospital with 4-day complaint of right lower extremity weakness with impaired mobility.  Per patient since his last discharge in April, the numbness and weakness in the right lower extremity has been progressively improving. On 08/12/2017 Sunday, he woke up with increased pelvic pain and worsened  weakness in right lower extremity whereby his right leg kept buckling under him and progressively affected the left leg with impaired mobility and scootting on his buttocks to get around.  Patient denies recent falls, trauma, back pain, deneis being informed of having slurred speech, confusion or dysarthria.  Patient has had bouts of dizziness, chills, nausea and vomiting as well as has actually recently followed up with neurology outpatient and was diagnosed with Mnire's disease and requested to follow-up with ENT.  At baseline patient has some underlying right lower extremity weakness, chronic blurred vision and neck pain.  On admission to the hospital due to his symptoms, MRI of the brain revealed acute bilateral cerebellar infarcts and occluded right vertebral artery, as well as his old strokes.  Neuro was consulted for eval. During his last admission, cerebral infarcts were thought to be  embolic strokes due to patient having bacteremia with infective pericarditis.  TEE was unable to be performed due to patient being a high risk but was started empirically on IV antibiotics and has since completed his 6 weeks course of Rocephin.  Patient denies dysuria, foul odored urine, cough with sputum production or recorded fever, but on admission patient noted to have leukocytosis.  On assessment this morning patient is awake alert oriented to self place and time has no new focal neuro deficits except for as listed above and does not answer questions appropriately.  LSN: 08/12/17 upon waking up tPA Given: No: - out of the time window   Baseline Premorbid modified Rankin scale (mRS): 1  Past Medical History:  Diagnosis Date  . Hypertension   . Stroke [facility])     Past Surgical History:  Procedure Laterality Date  . ENDOVASCULAR STENT INSERTION N/A 07/01/2017   Procedure: ENDOVASCULAR STENT GRAFT INSERTION;  Surgeon: Serafina Mitchell, MD;  Location: [facility] OR;  Service: Vascular;  Laterality: N/A;  . THROMBECTOMY FEMORAL ARTERY Bilateral 07/01/2017   Procedure: Bilateral IlioFemoral Thrombectomies;  Surgeon: Serafina Mitchell, MD;  Location: [facility] OR;  Service: Vascular;  Laterality: Bilateral;    No family history on file.: Denies family hx of stroke  Social History:  reports that he quit smoking about 4 weeks ago. His smoking use included cigarettes. He has never used smokeless tobacco. He reports that he does not drink alcohol or use drugs.  Allergies: No Known Allergies  Medications:  .  stroke: mapping our early stages of recovery book   Does not apply Once  . atorvastatin  80 mg Oral q1800  .  metoCLOPramide (REGLAN) injection  10 mg Intravenous Q8H  . pantoprazole  40 mg Oral Q1200  . senna-docusate  2 tablet Oral QHS    ROS: 10 point systems reviewed with patient and are negative except for above in HPI.  Physical Examination: Blood pressure 123/83, pulse 89, temperature 98.4 F  (36.9 C), temperature source Oral, resp. rate 16, height 5\' 7"  (1.702 m), weight 56.7 kg (125 lb), SpO2 96 %. HEENT-  Normocephalic, no lesions, without obvious abnormality.  Normal external eye and conjunctiva.   Cardiovascular- S1-S2 audible, pulses palpable throughout   Lungs-no rhonchi or wheezing noted, no excessive working breathing.  Saturations within normal limits Abdomen- non tender Musculoskeletal-no joint tenderness or edema, pulses palpable. Skin-warm and dry  Neurological Examination Mental Status: Alert, oriented, thought content appropriate.  Speech fluent without evidence of aphasia.  Pt speaks [place] with good English  Able to follow 3 step commands without difficulty. Cranial Nerves: II: Visual fields grossly normal,  III,IV, VI: ptosis not present, extra-ocular motions intact bilaterally pupils equal, round, reactive to light  V,VII: smile symmetric, facial light touch sensation normal bilaterally VIII: mild hard of hearing IX,X: uvula rises symmetrically XI: bilateral shoulder shrug XII: midline tongue extension Motor: Patient does move all extremities equally with 5/5 strength in bilateral lower extremities with abduction and abduction as well as hip flexion, 4/5 in right foot, 5/5 strength in left upper and lower extremities Tone and bulk:normal tone throughout; no atrophy noted Sensory: Decreased sensation in left lower extremities, he describes 70% sensation as compared to the right.    Deep Tendon Reflexes: 2+ and symmetric throughout in all extrmeities Plantars: Right: downgoing   Left: downgoing Cerebellar: normal finger-to-nose, normal rapid alternating movements  Gait: Not assessed  NIHSS: 1  Dg Chest 2 View  08/15/2017  IMPRESSION:  No active cardiopulmonary disease.   Ct Head Wo Contrast 08/15/2017 IMPRESSION: Old bilateral basal ganglia lacunar infarcts and sequelae of chronic small vessel disease without acute intracranial abnormality.   Mr  Brain Wo Contrast 08/16/2017 IMPRESSION:  1. Acute subcentimeter infarcts bilateral cerebella and RIGHT cervicomedullary junction. 2. Slow flow versus occluded RIGHT vertebral artery.  3. 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 Old small cerebellar infarcts.    Assessment: 62 y.o. male with a past medical history of Tobacco abuse,  CKD stage III, recent admission for bacteremia secondary to strep pneumonia, infective pericarditis and finished a 6-week course of Rocephin, ruptured AAA status post endovascular repair of AAA with post op complications of loss of RLE numbness and mild right foot  weaknes requiring  iliofemoral endarterectomy and embolectomy- symptoms were persistent with PVD, 3 left cerebellar infarct, old small bilateral cerebellar infarcts as well as old BG and thalamus infarcts with symptoms at the time of dizziness, right lower extremity weakness and numbness who presented to the hospital with 4-day complaint of right lower extremity weakness with impaired mobility.  MRI brain showed acute bilateral cerebellar infarcts as well as right vertebral artery occlusion  1.  Acute bilateral cerebellar infarcts in patient with recent small left cerebellar infarcts, older bilateral cerebellar infarcts as well as old basal ganglia and thalamic infarcts.   -Initially last stroke was thought to be secondary to septic emboli patient was treated empirically as TEE could not be performed secondary to patient being a high risk.  By the end of his hospital stay patient was started on aspirin and high-dose atorvastatin for secondary stroke prevention.  Due to the recurrence of symptoms, current  leukocytosis and location of infarcts, we will reevaluate for cardioembolic etiologies with echocardiogram and proceed with TEE if positive.  Continue infectious disease, with blood cultures and UA pending, chest x-ray within normal limits.  We have discussed with vascular surgery team and they approve  of starting dual antiplatelets with aspirin and statin.  CTA of the head and neck pending to eval vasculature especially in patient with several strokes and recent finding of vertebral artery stenosis.  Continue aggressive rehabilitation therapy due to patient's recent mobility impairment and right lower extremity weakness.   2. Right Vertebral artery stenosis -continue dual antiplatelets and high-dose atorvastatin 3. AAA rupture s/p repair on 07/01/17 -CTA of the abdomen pelvis was done to rule out for infection postop, vascular evaluated patient.  CT showed postop inflammatory changes, no obvious sign of infection. 4.  Hypertension 5.  CKD  stroke Risk Factors - hyperlipidemia, hypertension and previous stroke  Plan: - Start Dual antiplatelet with Aspirin and Plavix, Discussed and approved by Vascular team - CTA head/neck pending - Aggressive PT consult, OT consult, Speech consult - Echocardiogram to r/o cardio-embolic source, if positive recommend TEE - Continue infectious work-up-blood cultures and UA pending - High dose Statin Atorvastatin 80mg  daily - Allow for permissive hypertension for the first 24-48h  But patient out of the window, so continue to maintain normotensive BP with SBP<140 especially in the setting of recent AAA endovascular repair - Risk factor modification - Telemetry monitoring -  Frequent neuro checks   Thomas Cape DNP Neuro-hospitalist Team [phone] 08/16/2017, 10:44 AM   NEUROHOSPITALIST ADDENDUM Seen and examined the patient today. Low suspicion infarcts are septic emboli and ok to resume ASA. Can add plavix. I have reviewed the contents of history and physical exam as documented by PA/ARNP/Resident and agree with above documentation.  I have discussed and formulated the above plan as documented. Edits to the note have been made as needed.    Thomas Addison Shannel Zahm MD [facility] [phone]   If 7pm to 7am, please call on call as listed on  AMION.

## 2017-08-16 NOTE — Progress Notes (Signed)
PROGRESS NOTE    Thomas [name]  STM:[id number] DOB: 11-17-1955 DOA: 08/15/2017 PCP: Patient, No Pcp Per  Brief Narrative:  62 yo M with hx of CKD stage III, bacteremia 2/2 strep pneumo, CVA, ruptured AAA s/p bilateral iliofemoral endarterectomy and thromboembolectomy on 4/7 representing with with difficulty with ambulation, pelvic pain, and headache over the past few days.    Assessment & Plan:   Principal Problem:   Gait instability Active Problems:   Hypertension   Pelvic pain   Chronic kidney disease   Abnormal CT scan, pelvis   #1.  Acute Stroke  Gait Instability  RLE Weakness:  Suspect gait instability related to acute infarcts.  With acute subcentimeter infarcts bilateral cerebella and R cervicomedullary junction.  Slow flow vs occluded R vertebral artery.  Also old bilateral basal ganglia and thalami lancunar infarcts.  Some chronicity to the RLE weakness, noted at prior hospitalization.  - neurology c/s - echo -> may need TEE, this was considered at last hospitalization with concern for endocarditis and septic emboli, but looks like it was not done (low suspicion for this at this time with completion of abx course and blood cx so far NGTD) - CTA head and neck - PT/OT/SLP - A1c 5.6 at last hospitalization, lipids notable for LDL 72 and HDL 37 - increase atorva to 80 - start ASA and plavix  #2. Abnormal CTA of the pelvis. Patient 6 weeks status post emergent endovascular repair of ruptured aortic abdominal aneurysm and bilateral lower extremity thrombectomies with pelvic pain and gait instability. CTA done to evaluate endograft.CTA reveals Thomas [name] patent endograft but does reveal likely reactive postoperative inflammatory changes to aneurysm sac. No obvious sign of infection. Evaluated by vascular surgery who opined no indication for surgical intervention but recommended admitting for observation for infection. -Obtain blood cultures -Monitor vital signs  # Headache: resolved this  AM  #3. Chronic kidney disease stage III. Creatinine 1.4. -Hold nephrotoxins -Monitor urine output  #4. Hypertension.  -Monitor.  Generally, his BP's have been appropriate in 120's and 130's.  Occasionally with 150's.  If trend seems to be consistently hypertensive, will add agent. - goal SBP <140 with endovascular repair.  Will place prn labetalol  DVT prophylaxis: SCD Code Status: full  Family Communication: none at bedside Disposition Plan: likely CIR   Consultants:   Neuro  Vascular  Cardiology  Procedures:  Echo Study Conclusions  - Left ventricle: The cavity size was normal. There was moderate   concentric hypertrophy. Systolic function was vigorous. The   estimated ejection fraction was in the range of 65% to 70%. Wall   motion was normal; there were no regional wall motion   abnormalities. The study is not technically sufficient to allow   evaluation of LV diastolic function. - Aortic valve: There was mild to moderate regurgitation. - Atrial septum: There was increased thickness of the septum,   consistent with lipomatous hypertrophy. - Tricuspid valve: There was trivial regurgitation.   Antimicrobials:  Anti-infectives (From admission, onward)   None         Subjective: No complaints.  HA better.  Objective: Vitals:   08/15/17 2024 08/15/17 2200 08/16/17 0522 08/16/17 0840  BP:  (!) 141/94 119/79 123/83  Pulse: 66 68 100 89  Resp:   18 16  Temp:   98.2 F (36.8 C) 98.4 F (36.9 C)  TempSrc:    Oral  SpO2: 97% 99% 95% 96%  Weight:      Height:  Intake/Output Summary (Last 24 hours) at 08/16/2017 1031 Last data filed at 08/16/2017 0844 Gross per 24 hour  Intake 500.2 ml  Output 675 ml  Net -174.8 ml   Filed Weights   08/15/17 1003 08/15/17 1018  Weight: 56.7 kg (125 lb) 56.7 kg (125 lb)    Examination:  General exam: Appears calm and comfortable  Respiratory system: Clear to auscultation. Respiratory effort  normal. Cardiovascular system: S1 & S2 heard, RRR. No JVD, murmurs, rubs, gallops or clicks. No pedal edema. Gastrointestinal system: Abdomen is nondistended, soft and nontender. No organomegaly or masses felt. Normal bowel sounds heard. Central nervous system: Alert and oriented. No focal neurological deficits.  Improved strength today. Extremities: Symmetric 5 x 5 power. Skin: No rashes, lesions or ulcers Psychiatry: Judgement and insight appear normal. Mood & affect appropriate.     Data Reviewed: I have personally reviewed following labs and imaging studies  CBC: Recent Labs  Lab 08/15/17 1016 08/16/17 0625  WBC 11.8* 11.9*  NEUTROABS 6.1  --   HGB 13.3 12.2*  HCT 42.2 37.9*  MCV 78.9 78.1  PLT 383 462   Basic Metabolic Panel: Recent Labs  Lab 08/15/17 1016 08/16/17 0625  NA 138 138  K 4.2 4.0  CL 103 106  CO2 26 25  GLUCOSE 95 86  BUN 18 15  CREATININE 1.41* 1.27*  CALCIUM 9.5 9.0   GFR: Estimated Creatinine Clearance: 47.7 mL/min (Thomas [name]) (by C-G formula based on SCr of 1.27 mg/dL (H)). Liver Function Tests: Recent Labs  Lab 08/15/17 1016  AST 25  ALT 20  ALKPHOS 113  BILITOT 0.7  PROT 8.9*  ALBUMIN 3.5   Recent Labs  Lab 08/15/17 1016  LIPASE 31   No results for input(s): AMMONIA in the last 168 hours. Coagulation Profile: Recent Labs  Lab 08/15/17 1016  INR 1.01   Cardiac Enzymes: No results for input(s): CKTOTAL, CKMB, CKMBINDEX, TROPONINI in the last 168 hours. BNP (last 3 results) No results for input(s): PROBNP in the last 8760 hours. HbA1C: No results for input(s): HGBA1C in the last 72 hours. CBG: No results for input(s): GLUCAP in the last 168 hours. Lipid Profile: No results for input(s): CHOL, HDL, LDLCALC, TRIG, CHOLHDL, LDLDIRECT in the last 72 hours. Thyroid Function Tests: No results for input(s): TSH, T4TOTAL, FREET4, T3FREE, THYROIDAB in the last 72 hours. Anemia Panel: No results for input(s): VITAMINB12, FOLATE, FERRITIN,  TIBC, IRON, RETICCTPCT in the last 72 hours. Sepsis Labs: Recent Labs  Lab 08/15/17 1700  LATICACIDVEN 0.68    No results found for this or any previous visit (from the past 240 hour(s)).       Radiology Studies: Dg Chest 2 View  Result Date: 08/15/2017 CLINICAL DATA:  Patient admitted for stroke.  Weakness. EXAM: CHEST - 2 VIEW COMPARISON:  07/04/2017 FINDINGS: The heart size and mediastinal contours are within normal limits. Both lungs are clear. Subpleural scarring is seen along the periphery of the left lung base. Right-sided PICC line catheter terminates in the distal SVC. The visualized skeletal structures are unremarkable. IMPRESSION: No active cardiopulmonary disease. Electronically Signed   By: Ashley Royalty M.D.   On: 08/15/2017 14:18   Ct Head Wo Contrast  Result Date: 08/15/2017 CLINICAL DATA:  Recent stroke in April 2019. Difficulty walking without falling. Headache and lower extremity pain. EXAM: CT HEAD WITHOUT CONTRAST TECHNIQUE: Contiguous axial images were obtained from the base of the skull through the vertex without intravenous contrast. COMPARISON:  None. FINDINGS: Brain: No  mass lesion, intraparenchymal hemorrhage or extra-axial collection. No evidence of acute cortical infarct. Bilateral old basal ganglia lacunar infarcts. There is periventricular hypoattenuation compatible with chronic microvascular disease. Vascular: Atherosclerotic calcification of the internal carotid arteries at the skull base. No hyperdense vessel. Skull: Normal visualized skull base, calvarium and extracranial soft tissues. Sinuses/Orbits: No fluid levels or advanced mucosal thickening of the visualized paranasal sinuses. No mastoid or middle ear effusion. The orbits are normal. IMPRESSION: Old bilateral basal ganglia lacunar infarcts and sequelae of chronic small vessel disease without acute intracranial abnormality. Electronically Signed   By: Ulyses Jarred M.D.   On: 08/15/2017 14:29   Mr Brain  Wo Contrast  Result Date: 08/16/2017 CLINICAL DATA:  Acute onset inability to walk 3 days ago. Recent endovascular repair of ruptured aortic aneurysm, bilateral iliofemoral endarterectomies. History of hypertension. EXAM: MRI HEAD WITHOUT CONTRAST TECHNIQUE: Multiplanar, multiecho pulse sequences of the brain and surrounding structures were obtained without intravenous contrast. COMPARISON:  CT HEAD Aug 15, 2017 and MRI of the head June 28, 2017 FINDINGS: INTRACRANIAL CONTENTS: 3 new subcentimeter foci of reduced diffusion within RIGHT cerebellum, LEFT cerebellum and RIGHT cervicomedullary junction, identifiable lesions demonstrate low ADC values.No susceptibility artifact to suggest acute blood products. Old bilateral basal ganglia infarcts with ex vacuo dilatation subjacent lateral ventricle. Old bilateral thalami small infarcts. Old small cerebellar infarcts. Borderline parenchymal brain volume loss for age. No hydrocephalus. No midline shift, mass effect or masses. VASCULAR: T2 bright signal RIGHT vertebral artery. SKULL AND UPPER CERVICAL SPINE: No abnormal sellar expansion. No suspicious calvarial bone marrow signal. Craniocervical junction maintained. SINUSES/ORBITS: Trace paranasal sinus mucosal thickening. Mastoid air cells are well aerated.The included ocular globes and orbital contents are non-suspicious. OTHER: None. IMPRESSION: 1. Acute subcentimeter infarcts bilateral cerebella and RIGHT cervicomedullary junction. 2. Slow flow versus occluded RIGHT vertebral artery. 3. 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 Old small cerebellar infarcts. Electronically Signed   By: Elon Alas M.D.   On: 08/16/2017 00:32   Ct Angio Abd/pel W And/or Wo Contrast  Result Date: 08/15/2017 CLINICAL DATA:  62 year old male with Najae Filsaime history of ruptured abdominal aortic aneurysm, endovascular repair performed 07/01/2017 EXAM: CTA ABDOMEN AND PELVIS wITHOUT AND WITH CONTRAST TECHNIQUE: Multidetector  CT imaging of the abdomen and pelvis was performed using the standard protocol during bolus administration of intravenous contrast. Multiplanar reconstructed images and MIPs were obtained and reviewed to evaluate the vascular anatomy. CONTRAST:  100 cc  ISOVUE-370 IOPAMIDOL (ISOVUE-370) INJECTION 76% COMPARISON:  07/08/2017, 07/03/2017, 07/01/2017 FINDINGS: VASCULAR Aorta: Re- demonstration of endovascular repair of ruptured infrarenal abdominal aortic aneurysm, main device from the right and the contralateral gate from the left. Infrarenal fixation, with the graft margin just below the lowest, left, renal artery. No evidence of Ardith Test type 1 or Roshanna Cimino type 2 endoleak. Intense differential enhancement of the wall of the excluded aneurysm on the delayed images. Significantly decreased size of the retroperitoneal hematoma adjacent to the psoas muscle. Greatest diameter today measures 3.0 cm x 3.4 cm on the axial images. Fluid extends inferiorly in Mikailah Morel cone like configuration lateral to the left psoas muscle overlying the iliacus. Near complete resolution of hematoma surrounding the left iliac vasculature. The right limb terminates within the common iliac artery. The left limb terminates within the left common iliac artery. Greatest diameter of the excluded aneurysm sac measures 7.2 cm (image 23 of series 15), which is larger than the comparison CT studies. Interval resolution of gas within the aneurysm sac. Celiac: Celiac arteries  patent. No significant atherosclerotic changes at the origin. Traditional branch pattern of the celiac artery. Accessory left hepatic artery. SMA: Superior mesenteric artery is patent with no significant atherosclerotic changes at the origin. Renals: Main left and right renal arteries are patent. Again, irregularity at the proximal right renal artery may represent Alyene Predmore fusiform aneurysm, with associated stenosis. Small accessory left renal artery above the main renal artery. Main renal artery appears  patent, just above the proximal stent graft. IMA: IMA appears occluded at the origin. Left colic artery and superior rectal artery are patent via collateral flow. Right lower extremity: The right iliac limb is patent, with near complete interval resolution of the mural thrombus/thickening, with maintained flow channel. No evidence of Ioan Landini type 1 B endoleak. The hypogastric artery appears occluded in the interval. External iliac artery is patent. Surgical changes at the right common femoral artery. Proximal profunda femoris and superficial femoral artery patent. Left lower extremity: Left iliac limb is patent. No evidence of Loreli Debruler type 1 B endoleak. Left hypogastric artery and external iliac artery are patent. Proximal SFA and profunda femoris patent. Veins: Unremarkable appearance of the venous system. Review of the MIP images confirms the above findings. NON-VASCULAR Lower chest: Evidence of mild interlobular septal thickening. Linear opacities at the lung bases. Calcified granuloma of the right middle lobe. Hepatobiliary: Unremarkable appearance of the liver. Hyperdense material within the gallbladder lumen compatible with cholelithiasis. No evidence of associated inflammatory changes. Pancreas: Unremarkable pancreas Spleen: Unremarkable spleen Adrenals/Urinary Tract: Enhancing lesion of the left adrenal gland. Unremarkable appearance of the right adrenal gland. Right: No right-sided hydronephrosis. Similar appearance of cystic lesion at the superior right kidney. No nephrolithiasis. Unremarkable course the right ureter. Left: No left-sided hydronephrosis. 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 Unremarkable course the left ureter. Urinary bladder partially distended. Stomach/Bowel: Unremarkable stomach . Unremarkable small bowel. No abnormal distention. Moderate stool burden. No transition point. No inflammatory changes. Normal appendix. Colonic diverticular present without associated inflammation. No  inflammatory changes within the small bowel mesenteric. Lymphatic: No lymphadenopathy. Reproductive: Unremarkable appearance of the pelvic organs. Other: Small fat containing umbilical hernia. Musculoskeletal: No acute displaced fracture. Mild degenerative changes of the spine. IMPRESSION: Re-demonstration of endovascular repair of ruptured infrarenal abdominal aortic aneurysm. No evidence of endoleak or stent graft migration. The aneurysm sac demonstrates no internal enhancement or continued flow, however, there is intense mural enhancement on the delayed images, and the greatest diameter of the aneurysm sac has enlarged to Annaliah Rivenbark greatest diameter of 7.2 cm, previously 6.6 cm on presentation. The etiology of the enlargement is uncertain, potentially reactive or post inflammatory. Although gas has resolved in the interval, seen on the prior and most likely postoperative changes, infection cannot be excluded. There has been near complete resolution of the right limb mural thrombus, with patency maintained of the bilateral iliac limbs. Interval occlusion of the right hypogastric artery. Left hypogastric artery remains patent. Interval improvement of retroperitoneal hematoma, with near complete resolution adjacent to the iliac vasculature, and small persisting fluid collection lateral to the left psoas muscle. These results were called by telephone at the time of interpretation on 08/15/2017 at 4:09 pm to Dr. Jeannie Done PFEIFFER. 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 Correlation with duplex may be useful. Cholelithiasis. Evidence of mild pulmonary edema. Signed, Dulcy Fanny. Earleen Newport, DO [facility] Electronically Signed   By: Corrie Mckusick D.O.   On: 08/15/2017 16:09  Scheduled Meds: .  stroke: mapping our early stages of recovery book   Does not apply Once  .  atorvastatin  40 mg Oral q1800  . metoCLOPramide (REGLAN) injection  10 mg Intravenous Q8H  . pantoprazole  40 mg Oral Q1200  . senna-docusate  2 tablet Oral QHS   Continuous Infusions: . lactated ringers       LOS: 0 days    Time spent: over 30 min    Fayrene Helper, MD [facility] Pager [phone]  If 7PM-7AM, please contact night-coverage [email or web address] Password [facility] 08/16/2017, 10:31 AM

## 2017-08-16 NOTE — Consult Note (Signed)
Physical Medicine and Rehabilitation Consult   Reason for Consult: Functional deficits due to recent strokes and BLE numbness.  Referring Physician: Dr. Florene Glen  HPI: Thomas [name] is a 62 y.o. male is a 63 year old male with history of CKD, CVA, ruptured aneurysm s/p bilateratl iliofemoral endartectomy with thromboembolectomy 7/0/17 complicated by right iliac instent thrombosis with RLE numbness and mild right foot drop, left cerebellar infarcts, hemorrhagic shock and pneumococcal bacteremia with probable acute pericarditis. He was admitted on 08/15/17 with 3 day history of BLE weakness with abdominal and groin pain. CT abdomen/pelvis showed no evidence of leak and intense mural enhancement felt to be due to post op changes per Dr. Donzetta Matters. MRI brain repeated and showed acute subcentimeter infarcts bilateral cerebella and right cervicomedullary junction as well as slow flow v/s occluded R-VA.  Neurology consulted for input and recommended DAPT with echo/TEE to rule out cardioembolic stroke. Therapy evaluations completed today and patient limited due to pain and numbness LLE>RLE. CIR recommended due to functional deficits.    Review of Systems  Constitutional: Negative for chills and fever.  HENT: Negative for hearing loss and tinnitus.   Eyes: Negative for blurred vision and double vision.  Respiratory: Negative for cough.   Cardiovascular: Negative for chest pain and palpitations.  Gastrointestinal: Positive for abdominal pain (from umbilicus to groin). Negative for heartburn and nausea.  Genitourinary: Negative for dysuria.  Musculoskeletal: Negative for back pain and myalgias.  Neurological: Positive for dizziness (with head movements), sensory change (left calf numbness and decresed sensation RLE) and focal weakness.  Psychiatric/Behavioral: Negative for depression. The patient is not nervous/anxious.       Past Medical History:  Diagnosis Date  . Hypertension   . Stroke [facility])      Past Surgical History:  Procedure Laterality Date  . ENDOVASCULAR STENT INSERTION N/A 07/01/2017   Procedure: ENDOVASCULAR STENT GRAFT INSERTION;  Surgeon: Serafina Mitchell, MD;  Location: [facility] OR;  Service: Vascular;  Laterality: N/A;  . THROMBECTOMY FEMORAL ARTERY Bilateral 07/01/2017   Procedure: Bilateral IlioFemoral Thrombectomies;  Surgeon: Serafina Mitchell, MD;  Location: [facility] OR;  Service: Vascular;  Laterality: Bilateral;    No family history on file.    Social History:    Lives with cousin and his wife. Has a daughter in town.  Independent and was working in [place] at [employer] prior to surgery 06/2017.  reports that he quit smoking about 4 weeks ago. His smoking use included cigarettes. He has never used smokeless tobacco. He reports that he does not drink alcohol or use drugs.     Allergies: No Known Allergies    Medications Prior to Admission  Medication Sig Dispense Refill  . acetaminophen (TYLENOL) 500 MG tablet Take 500 mg by mouth every 6 (six) hours as needed (for pain or headaches).    Marland Kitchen aspirin EC 81 MG EC tablet Take 1 tablet (81 mg total) by mouth daily. 30 tablet 0  . docusate sodium (COLACE) 100 MG capsule Take 100 mg by mouth daily as needed for mild constipation.      Home: Home Living Family/patient expects to be discharged to:: Private residence Living Arrangements: Other relatives(cousin ) Available Help at Discharge: Family, Friend(s), Available PRN/intermittently Type of Home: House Home Access: Stairs to enter Technical brewer of Steps: 3 Entrance Stairs-Rails: None Home Layout: One level [place] - single point Additional Comments: also has local dtr that checks on him, 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,  sometimes other hours  Functional History: Prior Function Level of Independence: Independent Comments: works in [place] [employer]:  Mobility: Bed Mobility Overal bed mobility: Needs Assistance Bed  Mobility: Supine to Sit Supine to sit: Supervision General bed mobility comments: for safety and line management Transfers Overall transfer level: Needs assistance Equipment used: Rolling walker (2 wheeled) Transfers: Sit to/from Stand Sit to Stand: Min assist General transfer comment: min assist for anterior-superior wt shift and to stabilize once in standing with RW support Ambulation/Gait Ambulation/Gait assistance: Min assist, Mod assist Ambulation Distance (Feet): 12 Feet(in room) Assistive device: Rolling walker (2 wheeled) Gait Pattern/deviations: Step-through pattern, Decreased stride length, Narrow base of support General Gait Details: assist for balance and to maneuver RW in room, multi-modal cues for step length, RW position, heavy use of UEs    ADL:    Cognition: Cognition Overall Cognitive Status: Within Functional Limits for tasks assessed Orientation Level: Oriented X4 Cognition Arousal/Alertness: Awake/alert Behavior During Therapy: WFL for tasks assessed/performed Overall Cognitive Status: Within Functional Limits for tasks assessed  Blood pressure 134/79, pulse 75, temperature 97.9 F (36.6 C), temperature source Oral, resp. rate 19, height 5\' 7"  (1.702 m), weight 56.7 kg (125 lb), SpO2 99 %. Physical Exam  Nursing note and vitals reviewed. Constitutional: He is oriented to person, place, and time. He appears well-developed and well-nourished. No distress.  HENT:  Head: Normocephalic and atraumatic.  Eyes: Pupils are equal, round, and reactive to light. Conjunctivae and EOM are normal.  Cardiovascular: Normal rate, regular rhythm and normal heart sounds.  No murmur heard. Respiratory: Effort normal and breath sounds normal. No respiratory distress. He has no wheezes.  GI: Soft. Bowel sounds are normal. He exhibits no distension. There is no tenderness.  Musculoskeletal: Normal range of motion.  Neurological: He is alert and oriented to person, place, and  time. He displays no tremor. Gait abnormal. Coordination normal.  Strength is 5/5 bilateral deltoid bicep tricep grip 3- bilateral hip flexor 4 at bilateral knee extensor 4 bilateral ankle dorsiflexor No evidence of dysmetria bilateral finger-nose-finger testing  Skin: He is not diaphoretic.  Psychiatric: He has a normal mood and affect.    Results for orders placed or performed during the hospital encounter of 08/15/17 (from the past 24 hour(s))  Urinalysis, Routine w reflex microscopic     Status: Abnormal   Collection Time: 08/15/17  3:26 PM  Result Value Ref Range   Color, Urine YELLOW YELLOW   APPearance CLEAR CLEAR   Specific Gravity, Urine 1.020 1.005 - 1.030   pH 5.0 5.0 - 8.0   Glucose, UA NEGATIVE NEGATIVE mg/dL   Hgb urine dipstick SMALL (A) NEGATIVE   Bilirubin Urine NEGATIVE NEGATIVE   Ketones, ur NEGATIVE NEGATIVE mg/dL   Protein, ur 30 (A) NEGATIVE mg/dL   Nitrite NEGATIVE NEGATIVE   Leukocytes, UA NEGATIVE NEGATIVE   RBC / HPF 0-5 0 - 5 RBC/hpf   WBC, UA 0-5 0 - 5 WBC/hpf   Bacteria, UA RARE (A) NONE SEEN   Squamous Epithelial / LPF 0-5 0 - 5   Mucus PRESENT   Culture, blood (routine x 2)     Status: None (Preliminary result)   Collection Time: 08/15/17  4:40 PM  Result Value Ref Range   Specimen Description BLOOD RIGHT ARM    Special Requests      BOTTLES DRAWN AEROBIC AND ANAEROBIC Blood Culture results may not be optimal due to an inadequate volume of blood received in culture bottles  Culture      NO GROWTH < 24 HOURS Performed at [facility], [place] [address], [place], [place] [address]    Report Status PENDING   Culture, blood (routine x 2)     Status: None (Preliminary result)   Collection Time: 08/15/17  4:50 PM  Result Value Ref Range   Specimen Description BLOOD RIGHT FOREARM    Special Requests      BOTTLES DRAWN AEROBIC AND ANAEROBIC Blood Culture adequate volume   Culture      NO GROWTH < 24 HOURS Performed at [facility],  [place] [address] [place], [place] [address]    Report Status PENDING   I-Stat CG4 Lactic Acid, ED     Status: None   Collection Time: 08/15/17  5:00 PM  Result Value Ref Range   Lactic Acid, Venous 0.68 0.5 - 1.9 mmol/L  Basic metabolic panel     Status: Abnormal   Collection Time: 08/16/17  6:25 AM  Result Value Ref Range   Sodium 138 135 - 145 mmol/L   Potassium 4.0 3.5 - 5.1 mmol/L   Chloride 106 101 - 111 mmol/L   CO2 25 22 - 32 mmol/L   Glucose, Bld 86 65 - 99 mg/dL   BUN 15 6 - 20 mg/dL   Creatinine, Ser 1.27 (H) 0.61 - 1.24 mg/dL   Calcium 9.0 8.9 - 10.3 mg/dL   GFR calc non Af Amer 58 (L) >60 mL/min   GFR calc Af Amer >60 >60 mL/min   Anion gap 7 5 - 15  CBC     Status: Abnormal   Collection Time: 08/16/17  6:25 AM  Result Value Ref Range   WBC 11.9 (H) 4.0 - 10.5 K/uL   RBC 4.85 4.22 - 5.81 MIL/uL   Hemoglobin 12.2 (L) 13.0 - 17.0 g/dL   HCT 37.9 (L) 39.0 - 52.0 %   MCV 78.1 78.0 - 100.0 fL   MCH 25.2 (L) 26.0 - 34.0 pg   MCHC 32.2 30.0 - 36.0 g/dL   RDW 15.9 (H) 11.5 - 15.5 %   Platelets 356 150 - 400 K/uL  Lipid panel     Status: Abnormal   Collection Time: 08/16/17  6:25 AM  Result Value Ref Range   Cholesterol 174 0 - 200 mg/dL   Triglycerides 94 <150 mg/dL   HDL 40 (L) >40 mg/dL   Total CHOL/HDL Ratio 4.4 RATIO   VLDL 19 0 - 40 mg/dL   LDL Cholesterol 115 (H) 0 - 99 mg/dL   Dg Chest 2 View  Result Date: 08/15/2017 CLINICAL DATA:  Patient admitted for stroke.  Weakness. EXAM: CHEST - 2 VIEW COMPARISON:  07/04/2017 FINDINGS: The heart size and mediastinal contours are within normal limits. Both lungs are clear. Subpleural scarring is seen along the periphery of the left lung base. Right-sided PICC line catheter terminates in the distal SVC. The visualized skeletal structures are unremarkable. IMPRESSION: No active cardiopulmonary disease. Electronically Signed   By: Ashley Royalty M.D.   On: 08/15/2017 14:18   Ct Head Wo Contrast  Result Date: 08/15/2017 CLINICAL  DATA:  Recent stroke in April 2019. Difficulty walking without falling. Headache and lower extremity pain. EXAM: CT HEAD WITHOUT CONTRAST TECHNIQUE: Contiguous axial images were obtained from the base of the skull through the vertex without intravenous contrast. COMPARISON:  None. FINDINGS: Brain: No mass lesion, intraparenchymal hemorrhage or extra-axial collection. No evidence of acute cortical infarct. Bilateral old basal ganglia lacunar  infarcts. There is periventricular hypoattenuation compatible with chronic microvascular disease. Vascular: Atherosclerotic calcification of the internal carotid arteries at the skull base. No hyperdense vessel. Skull: Normal visualized skull base, calvarium and extracranial soft tissues. Sinuses/Orbits: No fluid levels or advanced mucosal thickening of the visualized paranasal sinuses. No mastoid or middle ear effusion. The orbits are normal. IMPRESSION: Old bilateral basal ganglia lacunar infarcts and sequelae of chronic small vessel disease without acute intracranial abnormality. Electronically Signed   By: Ulyses Jarred M.D.   On: 08/15/2017 14:29   Mr Brain Wo Contrast  Result Date: 08/16/2017 CLINICAL DATA:  Acute onset inability to walk 3 days ago. Recent endovascular repair of ruptured aortic aneurysm, bilateral iliofemoral endarterectomies. History of hypertension. EXAM: MRI HEAD WITHOUT CONTRAST TECHNIQUE: Multiplanar, multiecho pulse sequences of the brain and surrounding structures were obtained without intravenous contrast. COMPARISON:  CT HEAD Aug 15, 2017 and MRI of the head June 28, 2017 FINDINGS: INTRACRANIAL CONTENTS: 3 new subcentimeter foci of reduced diffusion within RIGHT cerebellum, LEFT cerebellum and RIGHT cervicomedullary junction, identifiable lesions demonstrate low ADC values.No susceptibility artifact to suggest acute blood products. Old bilateral basal ganglia infarcts with ex vacuo dilatation subjacent lateral ventricle. Old bilateral thalami  small infarcts. Old small cerebellar infarcts. Borderline parenchymal brain volume loss for age. No hydrocephalus. No midline shift, mass effect or masses. VASCULAR: T2 bright signal RIGHT vertebral artery. SKULL AND UPPER CERVICAL SPINE: No abnormal sellar expansion. No suspicious calvarial bone marrow signal. Craniocervical junction maintained. SINUSES/ORBITS: Trace paranasal sinus mucosal thickening. Mastoid air cells are well aerated.The included ocular globes and orbital contents are non-suspicious. OTHER: None. IMPRESSION: 1. Acute subcentimeter infarcts bilateral cerebella and RIGHT cervicomedullary junction. 2. Slow flow versus occluded RIGHT vertebral artery. 3. 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 Old small cerebellar infarcts. Electronically Signed   By: Elon Alas M.D.   On: 08/16/2017 00:32   Ct Angio Abd/pel W And/or Wo Contrast  Result Date: 08/15/2017 CLINICAL DATA:  62 year old male with a history of ruptured abdominal aortic aneurysm, endovascular repair performed 07/01/2017 EXAM: CTA ABDOMEN AND PELVIS wITHOUT AND WITH CONTRAST TECHNIQUE: Multidetector CT imaging of the abdomen and pelvis was performed using the standard protocol during bolus administration of intravenous contrast. Multiplanar reconstructed images and MIPs were obtained and reviewed to evaluate the vascular anatomy. CONTRAST:  100 cc  ISOVUE-370 IOPAMIDOL (ISOVUE-370) INJECTION 76% COMPARISON:  07/08/2017, 07/03/2017, 07/01/2017 FINDINGS: VASCULAR Aorta: Re- demonstration of endovascular repair of ruptured infrarenal abdominal aortic aneurysm, main device from the right and the contralateral gate from the left. Infrarenal fixation, with the graft margin just below the lowest, left, renal artery. No evidence of a type 1 or a type 2 endoleak. Intense differential enhancement of the wall of the excluded aneurysm on the delayed images. Significantly decreased size of the retroperitoneal hematoma  adjacent to the psoas muscle. Greatest diameter today measures 3.0 cm x 3.4 cm on the axial images. Fluid extends inferiorly in a cone like configuration lateral to the left psoas muscle overlying the iliacus. Near complete resolution of hematoma surrounding the left iliac vasculature. The right limb terminates within the common iliac artery. The left limb terminates within the left common iliac artery. Greatest diameter of the excluded aneurysm sac measures 7.2 cm (image 23 of series 15), which is larger than the comparison CT studies. Interval resolution of gas within the aneurysm sac. Celiac: Celiac arteries patent. No significant atherosclerotic changes at the origin. Traditional branch pattern of the celiac artery. Accessory left hepatic  artery. SMA: Superior mesenteric artery is patent with no significant atherosclerotic changes at the origin. Renals: Main left and right renal arteries are patent. Again, irregularity at the proximal right renal artery may represent a fusiform aneurysm, with associated stenosis. Small accessory left renal artery above the main renal artery. Main renal artery appears patent, just above the proximal stent graft. IMA: IMA appears occluded at the origin. Left colic artery and superior rectal artery are patent via collateral flow. Right lower extremity: The right iliac limb is patent, with near complete interval resolution of the mural thrombus/thickening, with maintained flow channel. No evidence of a type 1 B endoleak. The hypogastric artery appears occluded in the interval. External iliac artery is patent. Surgical changes at the right common femoral artery. Proximal profunda femoris and superficial femoral artery patent. Left lower extremity: Left iliac limb is patent. No evidence of a type 1 B endoleak. Left hypogastric artery and external iliac artery are patent. Proximal SFA and profunda femoris patent. Veins: Unremarkable appearance of the venous system. Review of the MIP  images confirms the above findings. NON-VASCULAR Lower chest: Evidence of mild interlobular septal thickening. Linear opacities at the lung bases. Calcified granuloma of the right middle lobe. Hepatobiliary: Unremarkable appearance of the liver. Hyperdense material within the gallbladder lumen compatible with cholelithiasis. No evidence of associated inflammatory changes. Pancreas: Unremarkable pancreas Spleen: Unremarkable spleen Adrenals/Urinary Tract: Enhancing lesion of the left adrenal gland. Unremarkable appearance of the right adrenal gland. Right: No right-sided hydronephrosis. Similar appearance of cystic lesion at the superior right kidney. No nephrolithiasis. Unremarkable course the right ureter. Left: No left-sided hydronephrosis. 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 Unremarkable course the left ureter. Urinary bladder partially distended. Stomach/Bowel: Unremarkable stomach . Unremarkable small bowel. No abnormal distention. Moderate stool burden. No transition point. No inflammatory changes. Normal appendix. Colonic diverticular present without associated inflammation. No inflammatory changes within the small bowel mesenteric. Lymphatic: No lymphadenopathy. Reproductive: Unremarkable appearance of the pelvic organs. Other: Small fat containing umbilical hernia. Musculoskeletal: No acute displaced fracture. Mild degenerative changes of the spine. IMPRESSION: Re-demonstration of endovascular repair of ruptured infrarenal abdominal aortic aneurysm. No evidence of endoleak or stent graft migration. The aneurysm sac demonstrates no internal enhancement or continued flow, however, there is intense mural enhancement on the delayed images, and the greatest diameter of the aneurysm sac has enlarged to a greatest diameter of 7.2 cm, previously 6.6 cm on presentation. The etiology of the enlargement is uncertain, potentially reactive or post inflammatory. Although gas has resolved in the interval,  seen on the prior and most likely postoperative changes, infection cannot be excluded. There has been near complete resolution of the right limb mural thrombus, with patency maintained of the bilateral iliac limbs. Interval occlusion of the right hypogastric artery. Left hypogastric artery remains patent. Interval improvement of retroperitoneal hematoma, with near complete resolution adjacent to the iliac vasculature, and small persisting fluid collection lateral to the left psoas muscle. These results were called by telephone at the time of interpretation on 08/15/2017 at 4:09 pm to Dr. Jeannie Done PFEIFFER. 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 Correlation with duplex may be useful. Cholelithiasis. Evidence of mild pulmonary edema. Signed, Dulcy Fanny. Earleen Newport, DO [facility] Radiology Electronically Signed   By: Corrie Mckusick D.O.   On: 08/15/2017 16:09    Assessment/Plan: Diagnosis: Paraparesis lower extremity sensory changes and gait disorder likely had spinal cord ischemia related to  AAA rupture, also has had a small cerebellar infarcts 1. Does the need for close, 24 hr/day medical supervision in concert with the patient's rehab needs make it unreasonable for this patient to be served in a less intensive setting? Yes 2. Co-Morbidities requiring supervision/potential complications: Peripheral vascular disease, 3. Due to bladder management, bowel management, safety, skin/wound care, disease management, medication administration, pain management and patient education, does the patient require 24 hr/day rehab nursing? Yes 4. Does the patient require coordinated care of a physician, rehab nurse, PT (1-2 hrs/day, 5 days/week) and OT (1-2 hrs/day, 5 days/week) to address physical and functional deficits in the context of the above medical diagnosis(es)? Yes Addressing deficits in  the following areas: balance, endurance, locomotion, strength, transferring, bowel/bladder control, bathing, dressing, feeding, grooming, toileting and psychosocial support 5. Can the patient actively participate in an intensive therapy program of at least 3 hrs of therapy per day at least 5 days per week? Yes 6. The potential for patient to make measurable gains while on inpatient rehab is excellent 7. Anticipated functional outcomes upon discharge from inpatient rehab are modified independent  with PT, modified independent with OT, n/a with SLP. 8. Estimated rehab length of stay to reach the above functional goals is: 7 to 10 days 9. Anticipated D/C setting: Home 10. Anticipated post D/C treatments: [place] therapy 11. Overall Rehab/Functional Prognosis: excellent  RECOMMENDATIONS: This patient's condition is appropriate for continued rehabilitative care in the following setting: CIR Patient has agreed to participate in recommended program. Yes Note that insurance prior authorization may be required for reimbursement for recommended care.  Comment: Patient's daughter was staying with him after his last surgery she can help him at home upon discharge as well   "I have personally performed a face to face diagnostic evaluation of this patient.  Additionally, I have reviewed and concur with the physician assistant's documentation above." Charlett Blake M.D. [facility] FAAPM&R (Sports Med, Neuromuscular Med) Diplomate Am Board of [place], PA-C 08/16/2017

## 2017-08-17 ENCOUNTER — Inpatient Hospital Stay (HOSPITAL_COMMUNITY): Payer: Private Health Insurance - Indemnity

## 2017-08-17 DIAGNOSIS — I712 Thoracic aortic aneurysm, without rupture: Secondary | ICD-10-CM | POA: Diagnosis present

## 2017-08-17 DIAGNOSIS — I63443 Cerebral infarction due to embolism of bilateral cerebellar arteries: Principal | ICD-10-CM

## 2017-08-17 DIAGNOSIS — Z8679 Personal history of other diseases of the circulatory system: Secondary | ICD-10-CM | POA: Diagnosis not present

## 2017-08-17 DIAGNOSIS — R269 Unspecified abnormalities of gait and mobility: Secondary | ICD-10-CM | POA: Diagnosis not present

## 2017-08-17 DIAGNOSIS — I6501 Occlusion and stenosis of right vertebral artery: Secondary | ICD-10-CM | POA: Diagnosis present

## 2017-08-17 DIAGNOSIS — R2681 Unsteadiness on feet: Secondary | ICD-10-CM | POA: Diagnosis not present

## 2017-08-17 DIAGNOSIS — G8220 Paraplegia, unspecified: Secondary | ICD-10-CM | POA: Diagnosis not present

## 2017-08-17 DIAGNOSIS — R2689 Other abnormalities of gait and mobility: Secondary | ICD-10-CM | POA: Diagnosis present

## 2017-08-17 DIAGNOSIS — I700 Atherosclerosis of aorta: Secondary | ICD-10-CM | POA: Diagnosis present

## 2017-08-17 DIAGNOSIS — R531 Weakness: Secondary | ICD-10-CM | POA: Diagnosis present

## 2017-08-17 DIAGNOSIS — N181 Chronic kidney disease, stage 1: Secondary | ICD-10-CM | POA: Diagnosis present

## 2017-08-17 DIAGNOSIS — N182 Chronic kidney disease, stage 2 (mild): Secondary | ICD-10-CM | POA: Diagnosis not present

## 2017-08-17 DIAGNOSIS — Z8673 Personal history of transient ischemic attack (TIA), and cerebral infarction without residual deficits: Secondary | ICD-10-CM | POA: Diagnosis not present

## 2017-08-17 DIAGNOSIS — D72829 Elevated white blood cell count, unspecified: Secondary | ICD-10-CM | POA: Diagnosis present

## 2017-08-17 DIAGNOSIS — Z7982 Long term (current) use of aspirin: Secondary | ICD-10-CM | POA: Diagnosis not present

## 2017-08-17 DIAGNOSIS — I454 Nonspecific intraventricular block: Secondary | ICD-10-CM | POA: Diagnosis not present

## 2017-08-17 DIAGNOSIS — Z23 Encounter for immunization: Secondary | ICD-10-CM | POA: Diagnosis not present

## 2017-08-17 DIAGNOSIS — F17200 Nicotine dependence, unspecified, uncomplicated: Secondary | ICD-10-CM | POA: Diagnosis present

## 2017-08-17 DIAGNOSIS — D649 Anemia, unspecified: Secondary | ICD-10-CM | POA: Diagnosis present

## 2017-08-17 DIAGNOSIS — R260 Ataxic gait: Secondary | ICD-10-CM | POA: Diagnosis present

## 2017-08-17 DIAGNOSIS — I129 Hypertensive chronic kidney disease with stage 1 through stage 4 chronic kidney disease, or unspecified chronic kidney disease: Secondary | ICD-10-CM | POA: Diagnosis present

## 2017-08-17 DIAGNOSIS — G4700 Insomnia, unspecified: Secondary | ICD-10-CM | POA: Diagnosis present

## 2017-08-17 DIAGNOSIS — R29701 NIHSS score 1: Secondary | ICD-10-CM | POA: Diagnosis present

## 2017-08-17 DIAGNOSIS — Z87891 Personal history of nicotine dependence: Secondary | ICD-10-CM | POA: Diagnosis not present

## 2017-08-17 DIAGNOSIS — E785 Hyperlipidemia, unspecified: Secondary | ICD-10-CM | POA: Diagnosis present

## 2017-08-17 DIAGNOSIS — R5381 Other malaise: Secondary | ICD-10-CM | POA: Diagnosis present

## 2017-08-17 DIAGNOSIS — Z7901 Long term (current) use of anticoagulants: Secondary | ICD-10-CM | POA: Diagnosis not present

## 2017-08-17 DIAGNOSIS — I6789 Other cerebrovascular disease: Secondary | ICD-10-CM | POA: Diagnosis present

## 2017-08-17 DIAGNOSIS — I663 Occlusion and stenosis of cerebellar arteries: Secondary | ICD-10-CM | POA: Diagnosis not present

## 2017-08-17 DIAGNOSIS — R200 Anesthesia of skin: Secondary | ICD-10-CM | POA: Diagnosis present

## 2017-08-17 DIAGNOSIS — I739 Peripheral vascular disease, unspecified: Secondary | ICD-10-CM | POA: Diagnosis present

## 2017-08-17 DIAGNOSIS — N183 Chronic kidney disease, stage 3 (moderate): Secondary | ICD-10-CM | POA: Diagnosis present

## 2017-08-17 DIAGNOSIS — G463 Brain stem stroke syndrome: Secondary | ICD-10-CM | POA: Diagnosis present

## 2017-08-17 DIAGNOSIS — Z95828 Presence of other vascular implants and grafts: Secondary | ICD-10-CM | POA: Diagnosis not present

## 2017-08-17 DIAGNOSIS — I69393 Ataxia following cerebral infarction: Secondary | ICD-10-CM | POA: Diagnosis not present

## 2017-08-17 DIAGNOSIS — Z79899 Other long term (current) drug therapy: Secondary | ICD-10-CM | POA: Diagnosis not present

## 2017-08-17 LAB — CBC
HEMATOCRIT: 36.3 % — AB (ref 39.0–52.0)
Hemoglobin: 11.6 g/dL — ABNORMAL LOW (ref 13.0–17.0)
MCH: 25.1 pg — ABNORMAL LOW (ref 26.0–34.0)
MCHC: 32 g/dL (ref 30.0–36.0)
MCV: 78.4 fL (ref 78.0–100.0)
PLATELETS: 332 10*3/uL (ref 150–400)
RBC: 4.63 MIL/uL (ref 4.22–5.81)
RDW: 15.9 % — ABNORMAL HIGH (ref 11.5–15.5)
WBC: 11.7 10*3/uL — ABNORMAL HIGH (ref 4.0–10.5)

## 2017-08-17 LAB — BASIC METABOLIC PANEL
ANION GAP: 8 (ref 5–15)
BUN: 16 mg/dL (ref 6–20)
CO2: 26 mmol/L (ref 22–32)
Calcium: 9 mg/dL (ref 8.9–10.3)
Chloride: 105 mmol/L (ref 101–111)
Creatinine, Ser: 1.39 mg/dL — ABNORMAL HIGH (ref 0.61–1.24)
GFR, EST NON AFRICAN AMERICAN: 52 mL/min — AB (ref >60)
GLUCOSE: 90 mg/dL (ref 65–99)
POTASSIUM: 4.4 mmol/L (ref 3.5–5.1)
Sodium: 139 mmol/L (ref 135–145)

## 2017-08-17 LAB — MAGNESIUM: Magnesium: 1.8 mg/dL (ref 1.7–2.4)

## 2017-08-17 MED ORDER — WARFARIN SODIUM 5 MG PO TABS
5.0000 mg | ORAL_TABLET | Freq: Once | ORAL | Status: AC
  Administered 2017-08-17: 5 mg via ORAL
  Filled 2017-08-17: qty 1

## 2017-08-17 MED ORDER — WARFARIN - PHARMACIST DOSING INPATIENT: Freq: Every day | Status: DC

## 2017-08-17 MED ORDER — ENOXAPARIN SODIUM 60 MG/0.6ML ~~LOC~~ SOLN
60.0000 mg | Freq: Two times a day (BID) | SUBCUTANEOUS | Status: DC
  Administered 2017-08-17 – 2017-08-18 (×2): 60 mg via SUBCUTANEOUS
  Filled 2017-08-17 (×3): qty 0.6

## 2017-08-17 MED ORDER — IOPAMIDOL (ISOVUE-370) INJECTION 76%
INTRAVENOUS | Status: AC
  Filled 2017-08-17: qty 100

## 2017-08-17 MED ORDER — ASPIRIN EC 81 MG PO TBEC
81.0000 mg | DELAYED_RELEASE_TABLET | Freq: Every day | ORAL | Status: DC
  Administered 2017-08-18: 81 mg via ORAL
  Filled 2017-08-17: qty 1

## 2017-08-17 NOTE — Progress Notes (Signed)
PROGRESS NOTE    Thomas [name]  HMC:[id number] DOB: 1956/02/21 DOA: 08/15/2017 PCP: Patient, No Pcp Per  Brief Narrative:  62 yo M with hx of CKD stage III, bacteremia 2/2 strep pneumo, CVA, ruptured AAA s/p bilateral iliofemoral endarterectomy and thromboembolectomy on 4/7 representing with with difficulty with ambulation, pelvic pain, and headache over the past few days.    Assessment & Plan:   Principal Problem:   Gait instability Active Problems:   Hypertension   Pelvic pain   Chronic kidney disease   Abnormal CT scan, pelvis   Cerebral embolism with cerebral infarction   Paraparesis (Thomas [name])   #1.  Acute Stroke  Gait Instability  RLE Weakness:  Suspect gait instability related to acute infarcts.  With acute subcentimeter infarcts bilateral cerebella and R cervicomedullary junction.  Slow flow vs occluded R vertebral artery.  Also old bilateral basal ganglia and thalami lancunar infarcts.  Some chronicity to the RLE weakness, noted at prior hospitalization.  Noticed R sided facial numbness yesterday (discussed with neuro who noted c/w stroke on imaging).  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  Also dilated R laryngeal ventricle and piriform sinus suggesting R sided vocal cord paralysis. - CTA chest with 4.5 ascending thoracic aortic aneurysm with soft plaque -> given this finding, planning for lovenox bridge with coumadin.  INR goal 2-3.  Continue aspirin.  - PT/OT/SLP -> recommending CIR - hopefully transfer tomorrow - A1c 5.6 at last hospitalization, lipids notable for LDL 72 and HDL 37 - increase atorva to 80  #2. Abnormal CTA of the pelvis. Patient 6 weeks status post emergent endovascular repair of ruptured aortic  abdominal aneurysm and bilateral lower extremity thrombectomies with pelvic pain and gait instability. CTA done to evaluate endograft.CTA reveals Thomas [name] patent endograft but does reveal likely reactive postoperative inflammatory changes to aneurysm sac. No obvious sign of infection. Evaluated by vascular surgery who opined no indication for surgical intervention but recommended admitting for observation for infection. -Obtain blood cultures (ngtd) -Monitor vital signs  # Headache: resolved this AM  #3. Chronic kidney disease stage III. Creatinine 1.4. -Hold nephrotoxins -Monitor urine output  #4. Hypertension.  -Monitor.  Generally, his BP's have been appropriate in 120's and 130's.  Occasionally with 150's.  If trend seems to be consistently hypertensive, will add agent. - goal SBP <140 with endovascular repair.  Will place prn labetalol  # Thoracic Aortic Aneurysm: outpatient follow up # L adrenal Nodule: follow up outpaient  DVT prophylaxis: lovenox, warfarin  Code Status: full  Family Communication: none at bedside Disposition Plan: likely CIR   Consultants:   Neuro  Vascular  Cardiology  Procedures:  Echo Study Conclusions  - Left ventricle: The cavity size was normal. There was moderate   concentric hypertrophy. Systolic function was vigorous. The   estimated ejection fraction was in the range of 65% to 70%. Wall   motion was normal; there were no regional wall motion   abnormalities. The study is not technically sufficient to allow   evaluation of LV diastolic function. - Aortic valve: There was mild to moderate regurgitation. - Atrial septum: There was increased thickness of the septum,   consistent with lipomatous hypertrophy. - Tricuspid valve: There was trivial regurgitation.  Antimicrobials:  Anti-infectives (From admission, onward)   None         Subjective: No complaints.   Noticed R side facial numbness yesterday.  Objective: Vitals:    08/17/17 0419 08/17/17 0759 08/17/17 1147 08/17/17 1543  BP: (!) 149/82     Pulse: 82     Resp: 18     Temp: 98.1 F (36.7 C) 98.2 F (36.8 C) 97.9 F (36.6 C) 97.6 F (36.4 C)  TempSrc: Oral Oral Oral Oral  SpO2: 97%     Weight:      Height:        Intake/Output Summary (Last 24 hours) at 08/17/2017 1743 Last data filed at 08/17/2017 1543 Gross per 24 hour  Intake 1355 ml  Output 2450 ml  Net -1095 ml   Filed Weights   08/15/17 1003 08/15/17 1018  Weight: 56.7 kg (125 lb) 56.7 kg (125 lb)    Examination:  General: No acute distress. Cardiovascular: Heart sounds show Tonya Carlile regular rate, and rhythm. No gallops or rubs. No murmurs. No JVD. Lungs: Clear to auscultation bilaterally with good air movement. No rales, rhonchi or wheezes. Abdomen: Soft, nontender, nondistended with normal active bowel sounds. No masses. No hepatosplenomegaly. Neurological: Alert and oriented 3. 5/5 strength to upper extremity. 4/5 to RLE.  Decreased sensation to R side of face Skin: Warm and dry. No rashes or lesions. Extremities: No clubbing or cyanosis. No edema.  Psychiatric: Mood and affect are normal. Insight and judgment are appropriate.   Data Reviewed: I have personally reviewed following labs and imaging studies  CBC: Recent Labs  Lab 08/15/17 1016 08/16/17 0625 08/17/17 0440  WBC 11.8* 11.9* 11.7*  NEUTROABS 6.1  --   --   HGB 13.3 12.2* 11.6*  HCT 42.2 37.9* 36.3*  MCV 78.9 78.1 78.4  PLT 383 356 573   Basic Metabolic Panel: Recent Labs  Lab 08/15/17 1016 08/16/17 0625 08/17/17 0440  NA 138 138 139  K 4.2 4.0 4.4  CL 103 106 105  CO2 26 25 26   GLUCOSE 95 86 90  BUN 18 15 16   CREATININE 1.41* 1.27* 1.39*  CALCIUM 9.5 9.0 9.0  MG  --   --  1.8   GFR: Estimated Creatinine Clearance: 43.6 mL/min ([place]) (by C-G formula based on SCr of 1.39 mg/dL (H)). Liver Function Tests: Recent Labs  Lab 08/15/17 1016  AST 25  ALT 20  ALKPHOS 113  BILITOT 0.7  PROT 8.9*    ALBUMIN 3.5   Recent Labs  Lab 08/15/17 1016  LIPASE 31   No results for input(s): AMMONIA in the last 168 hours. Coagulation Profile: Recent Labs  Lab 08/15/17 1016  INR 1.01   Cardiac Enzymes: No results for input(s): CKTOTAL, CKMB, CKMBINDEX, TROPONINI in the last 168 hours. BNP (last 3 results) No results for input(s): PROBNP in the last 8760 hours. HbA1C: No results for input(s): HGBA1C in the last 72 hours. CBG: No results for input(s): GLUCAP in the last 168 hours. Lipid Profile: Recent Labs    08/16/17 0625  CHOL 174  HDL 40*  LDLCALC 115*  TRIG 94  CHOLHDL 4.4   Thyroid Function Tests: No results for input(s): TSH, T4TOTAL, FREET4, T3FREE, THYROIDAB in the last 72 hours. Anemia Panel: No results for input(s): VITAMINB12, FOLATE, FERRITIN, TIBC, IRON, RETICCTPCT in the last 72 hours. Sepsis Labs: Recent Labs  Lab 08/15/17 1700  LATICACIDVEN 0.68    Recent Results (from the past 240 hour(s))  Culture, blood (routine  x 2)     Status: None (Preliminary result)   Collection Time: 08/15/17  4:40 PM  Result Value Ref Range Status   Specimen Description BLOOD RIGHT ARM  Final   Special Requests   Final    BOTTLES DRAWN AEROBIC AND ANAEROBIC Blood Culture results may not be optimal due to an inadequate volume of blood received in culture bottles   Culture   Final    NO GROWTH 2 DAYS Performed at [facility], [place] [address], [place], [place] [address]    Report Status PENDING  Incomplete  Culture, blood (routine x 2)     Status: None (Preliminary result)   Collection Time: 08/15/17  4:50 PM  Result Value Ref Range Status   Specimen Description BLOOD RIGHT FOREARM  Final   Special Requests   Final    BOTTLES DRAWN AEROBIC AND ANAEROBIC Blood Culture adequate volume   Culture   Final    NO GROWTH 2 DAYS Performed at [facility], [place] [address], [place], [place] [address]    Report Status PENDING  Incomplete         Radiology  Studies: Ct Angio Head W Or Wo Contrast  Result Date: 08/16/2017 CLINICAL DATA:  Stroke follow-up EXAM: CT ANGIOGRAPHY HEAD AND NECK TECHNIQUE: Multidetector CT imaging of the head and neck was performed using the standard protocol during bolus administration of intravenous contrast. Multiplanar CT image reconstructions and MIPs were obtained to evaluate the vascular anatomy. Carotid stenosis measurements (when applicable) are obtained utilizing NASCET criteria, using the distal internal carotid diameter as the denominator. CONTRAST:  41mL ISOVUE-370 IOPAMIDOL (ISOVUE-370) INJECTION 76% COMPARISON:  Head CT 08/15/2017 FINDINGS: CTA NECK FINDINGS AORTIC ARCH: There is moderate low density atherosclerosis of the aortic arch. On the most inferior image of the study, there is Analleli Gierke small central filling defect within the aortic arch. This is unchanged compared to 07/03/2017. There is no aneurysm, dissection or hemodynamically significant stenosis of the visualized ascending aorta and aortic arch. Conventional 3 vessel aortic branching pattern. Moderate stenosis of the origin of the left subclavian artery which is otherwise normal. RIGHT CAROTID SYSTEM: --Common carotid artery: Widely patent origin without common carotid artery dissection or aneurysm. --Internal carotid artery: No dissection, occlusion or aneurysm. No hemodynamically significant stenosis. --External carotid artery: No acute abnormality. LEFT CAROTID SYSTEM: --Common carotid artery: Widely patent origin without common carotid artery dissection or aneurysm. --Internal carotid artery:No dissection, occlusion or aneurysm. No hemodynamically significant stenosis. --External carotid artery: No acute abnormality. VERTEBRAL ARTERIES: Left dominant configuration. Severe narrowing of the origin of the right vertebral artery, unchanged. The right vertebral artery remains diminutive along its entire course and terminates in the right PICA. The left vertebral artery  origin is normal. The left vertebral artery is normal to the skull base. SKELETON: There is no bony spinal canal stenosis. No lytic or blastic lesion. OTHER NECK: Likely paralysis of the right vocal cord. UPPER CHEST: Biapical emphysema. CTA HEAD FINDINGS ANTERIOR CIRCULATION: --Intracranial internal carotid arteries: Atherosclerotic calcification of the internal carotid arteries at the skull base without hemodynamically significant stenosis. --Anterior cerebral arteries: Normal. Both A1 segments are present. Patent anterior communicating artery. --Middle cerebral arteries: Chronic occlusion of the right M1 segment. --Posterior communicating arteries: Absent bilaterally. POSTERIOR CIRCULATION: --Basilar artery: Normal. --Posterior cerebral arteries: Normal. --Superior cerebellar arteries: Normal. --Inferior cerebellar arteries: Normal anterior and posterior inferior cerebellar arteries. VENOUS SINUSES: As permitted by contrast timing, patent. ANATOMIC VARIANTS: None DELAYED PHASE: No parenchymal  contrast enhancement. Review of the MIP images confirms the above findings. IMPRESSION: 1. No new emergent large vessel occlusion. 2. 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 3. Unchanged severe stenosis or occlusion of the origin of the right vertebral artery, which is otherwise patent to the skull base. 4. No hemodynamically significant stenosis of the carotid arteries. 5. Bulky noncalcified plaque in the aortic arch with moderate stenosis of the proximal left subclavian artery. 6. Dilated right laryngeal ventricle and piriform sinus suggesting right-sided vocal cord paralysis. Aortic Atherosclerosis (ICD10-I70.0) and Emphysema (ICD10-J43.9). Electronically Signed   By: Ulyses Jarred M.D.   On: 08/16/2017 21:26   Ct Angio Neck W Or Wo Contrast  Result Date: 08/16/2017 CLINICAL DATA:  Stroke follow-up EXAM: CT ANGIOGRAPHY HEAD AND NECK TECHNIQUE: Multidetector CT imaging of the head and neck was  performed using the standard protocol during bolus administration of intravenous contrast. Multiplanar CT image reconstructions and MIPs were obtained to evaluate the vascular anatomy. Carotid stenosis measurements (when applicable) are obtained utilizing NASCET criteria, using the distal internal carotid diameter as the denominator. CONTRAST:  18mL ISOVUE-370 IOPAMIDOL (ISOVUE-370) INJECTION 76% COMPARISON:  Head CT 08/15/2017 FINDINGS: CTA NECK FINDINGS AORTIC ARCH: There is moderate low density atherosclerosis of the aortic arch. On the most inferior image of the study, there is Ceazia Harb small central filling defect within the aortic arch. This is unchanged compared to 07/03/2017. There is no aneurysm, dissection or hemodynamically significant stenosis of the visualized ascending aorta and aortic arch. Conventional 3 vessel aortic branching pattern. Moderate stenosis of the origin of the left subclavian artery which is otherwise normal. RIGHT CAROTID SYSTEM: --Common carotid artery: Widely patent origin without common carotid artery dissection or aneurysm. --Internal carotid artery: No dissection, occlusion or aneurysm. No hemodynamically significant stenosis. --External carotid artery: No acute abnormality. LEFT CAROTID SYSTEM: --Common carotid artery: Widely patent origin without common carotid artery dissection or aneurysm. --Internal carotid artery:No dissection, occlusion or aneurysm. No hemodynamically significant stenosis. --External carotid artery: No acute abnormality. VERTEBRAL ARTERIES: Left dominant configuration. Severe narrowing of the origin of the right vertebral artery, unchanged. The right vertebral artery remains diminutive along its entire course and terminates in the right PICA. The left vertebral artery origin is normal. The left vertebral artery is normal to the skull base. SKELETON: There is no bony spinal canal stenosis. No lytic or blastic lesion. OTHER NECK: Likely paralysis of the right vocal  cord. UPPER CHEST: Biapical emphysema. CTA HEAD FINDINGS ANTERIOR CIRCULATION: --Intracranial internal carotid arteries: Atherosclerotic calcification of the internal carotid arteries at the skull base without hemodynamically significant stenosis. --Anterior cerebral arteries: Normal. Both A1 segments are present. Patent anterior communicating artery. --Middle cerebral arteries: Chronic occlusion of the right M1 segment. --Posterior communicating arteries: Absent bilaterally. POSTERIOR CIRCULATION: --Basilar artery: Normal. --Posterior cerebral arteries: Normal. --Superior cerebellar arteries: Normal. --Inferior cerebellar arteries: Normal anterior and posterior inferior cerebellar arteries. VENOUS SINUSES: As permitted by contrast timing, patent. ANATOMIC VARIANTS: None DELAYED PHASE: No parenchymal contrast enhancement. Review of the MIP images confirms the above findings. IMPRESSION: 1. No new emergent large vessel occlusion. 2. 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 3. Unchanged severe stenosis or occlusion of the origin of the right vertebral artery, which is otherwise patent to the skull base. 4. No hemodynamically significant stenosis of the carotid arteries. 5. Bulky noncalcified plaque in the aortic arch with moderate stenosis of the proximal left subclavian artery. 6. Dilated right laryngeal ventricle and piriform sinus suggesting  right-sided vocal cord paralysis. Aortic Atherosclerosis (ICD10-I70.0) and Emphysema (ICD10-J43.9). Electronically Signed   By: Ulyses Jarred M.D.   On: 08/16/2017 21:26   Mr Brain Wo Contrast  Result Date: 08/16/2017 CLINICAL DATA:  Acute onset inability to walk 3 days ago. Recent endovascular repair of ruptured aortic aneurysm, bilateral iliofemoral endarterectomies. History of hypertension. EXAM: MRI HEAD WITHOUT CONTRAST TECHNIQUE: Multiplanar, multiecho pulse sequences of the brain and surrounding structures were obtained without intravenous  contrast. COMPARISON:  CT HEAD Aug 15, 2017 and MRI of the head June 28, 2017 FINDINGS: INTRACRANIAL CONTENTS: 3 new subcentimeter foci of reduced diffusion within RIGHT cerebellum, LEFT cerebellum and RIGHT cervicomedullary junction, identifiable lesions demonstrate low ADC values.No susceptibility artifact to suggest acute blood products. Old bilateral basal ganglia infarcts with ex vacuo dilatation subjacent lateral ventricle. Old bilateral thalami small infarcts. Old small cerebellar infarcts. Borderline parenchymal brain volume loss for age. No hydrocephalus. No midline shift, mass effect or masses. VASCULAR: T2 bright signal RIGHT vertebral artery. SKULL AND UPPER CERVICAL SPINE: No abnormal sellar expansion. No suspicious calvarial bone marrow signal. Craniocervical junction maintained. SINUSES/ORBITS: Trace paranasal sinus mucosal thickening. Mastoid air cells are well aerated.The included ocular globes and orbital contents are non-suspicious. OTHER: None. IMPRESSION: 1. Acute subcentimeter infarcts bilateral cerebella and RIGHT cervicomedullary junction. 2. Slow flow versus occluded RIGHT vertebral artery. 3. 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 Old small cerebellar infarcts. Electronically Signed   By: Elon Alas M.D.   On: 08/16/2017 00:32   Ct Angio Chest Aorta W/cm &/or Wo/cm  Result Date: 08/17/2017 CLINICAL DATA:  Nontraumatic aortic disease. EXAM: CT ANGIOGRAPHY CHEST WITH CONTRAST TECHNIQUE: Multidetector CT imaging of the chest was performed using the standard protocol during bolus administration of intravenous contrast. Multiplanar CT image reconstructions and MIPs were obtained to evaluate the vascular anatomy. CONTRAST:  <See Chart> ISOVUE-370 IOPAMIDOL (ISOVUE-370) INJECTION 76% COMPARISON:  CT abdomen pelvis 07/08/2017 FINDINGS: Cardiovascular: 4.5 cm ascending thoracic aortic aneurysm with soft plaque along the ascending thoracic aorta, arch and to Gregoire Bennis lesser  degree the descending thoracic aorta. Minimal ulcerated plaque posteriorly within the ascending thoracic aorta, series 5/71. No dissection is visualized. Conventional branch pattern of the great vessels atherosclerosis. No significant stenosis. Left vertebral arterial dominance noted. Heart is top-normal without pericardial effusion or thickening. Mediastinum/Nodes: Normal thyroid without mass. Patent midline trachea and mainstem bronchi. Mild esophageal thickening proximally query esophagitis. No large central pulmonary embolus. Lungs/Pleura: Bilateral centrilobular and paraseptal emphysema. No dominant mass. Dependent bibasilar atelectasis and mild pleural thickening along the left major fissure. Upper Abdomen: Nonspecific 1.6 x 1 cm hyperdense nodule of the left adrenal gland. Simple cyst noted off the upper pole of the right kidney measuring 2 cm. An interpolar left renal cyst is also noted too small to further characterize measuring approximately 7 mm. Numerous gallstones are present within the gallbladder. Remainder of the abdomen is unremarkable. Musculoskeletal: No chest wall abnormality. No acute or significant osseous findings. Review of the MIP images confirms the above findings. IMPRESSION: 1. 4.5 cm ascending thoracic aortic aneurysm with soft plaque as above described, some which is minimally ulcerated along the posterior aspect of the distal ascending aorta. No dissection. Recommend semi-annual imaging followup by CTA or MRA and referral to cardiothoracic surgery if not already obtained. This recommendation follows 2010 ACCF/AHA/AATS/ACR/ASA/SCA/SCAI/SIR/STS/SVM Guidelines for the Diagnosis and Management of Patients With Thoracic Aortic Disease. Circulation. 2010; 121: Z610-R604 2. Mild cardiomegaly without pericardial effusion or thickening. 3. No acute pulmonary embolus. 4. Bilateral centrilobular  and paraseptal emphysema with dependent bibasilar atelectasis. 5. Indeterminate left adrenal nodule  measuring 1.6 x 1 cm. This could be further correlated with dedicated CT or MRI. 6. Uncomplicated cholelithiasis. 7. Bilateral renal cysts. Aortic aneurysm NOS (ICD10-I71.9). Aortic Atherosclerosis (ICD10-I70.0) and Emphysema (ICD10-J43.9). Electronically Signed   By: Ashley Royalty M.D.   On: 08/17/2017 14:51        Scheduled Meds: . [START ON 08/18/2017] aspirin EC  81 mg Oral Daily  . atorvastatin  80 mg Oral q1800  . enoxaparin (LOVENOX) injection  60 mg Subcutaneous Q12H  . iopamidol      . pantoprazole  40 mg Oral Q1200  . senna-docusate  2 tablet Oral QHS  . warfarin  5 mg Oral ONCE-1800  . Warfarin - Pharmacist Dosing Inpatient   Does not apply q1800   Continuous Infusions:    LOS: 0 days    Time spent: over 30 min    Fayrene Helper, MD [facility] Pager [phone]  If 7PM-7AM, please contact night-coverage [email or web address] Password [facility] 08/17/2017, 5:43 PM

## 2017-08-17 NOTE — Evaluation (Signed)
Occupational Therapy Evaluation Patient Details Name: Thomas [name] MRN: [id number] DOB: 06-Jan-1956 Today's Date: 08/17/2017    History of Present Illness  62 yo M with hx of CKD stage III,  CVA, s/p emergent repair ruptured AAA, s/p bilateral iliofemoral endarterectomy and thromboembolectomy on 4/7 representing with difficulty ambulation, pelvic pain, and headache; CT negative; MRI=Acute subcentimeter infarcts bilateral cerebella and RIGHT   Clinical Impression   PTA Pt independent in ADL and mobility. Pt is currently min A for ambulation with RW in room necessary for toilet transfer. Min A for LB ADL requiring sit <>stand. Min A for balance at sink. Initial MMT for UE reveal no differences between L and R however, Pt reports fatigue with prolonged use of RW. Pt also reports visual disturbances and "missing the beginning of words" when reading. Further investigation into visual deficits will be necessary with next session. OT will continue to follow acutely and recommending CIR level therapy to maximize safety and independence in ADL and functional transfers.     Follow Up Recommendations  CIR;Supervision/Assistance - 24 hour    Equipment Recommendations  Other (comment)(defer to next venue)    Recommendations for Other Services       Precautions / Restrictions Precautions Precautions: Fall Restrictions Weight Bearing Restrictions: No      Mobility Bed Mobility Overal bed mobility: Modified Independent Bed Mobility: Supine to Sit           General bed mobility comments: increased time and effort  Transfers Overall transfer level: Needs assistance Equipment used: Rolling walker (2 wheeled) Transfers: Sit to/from Stand Sit to Stand: Min assist         General transfer comment: assist for balance upon standing due to L sided weakness; cues for safe hand placement when standing to RW    Balance Overall balance assessment: Needs assistance Sitting-balance support: No upper  extremity supported;Feet supported Sitting balance-Leahy Scale: Fair       Standing balance-Leahy Scale: Poor Standing balance comment: heavy reliance on bilat UE support                           ADL either performed or assessed with clinical judgement   ADL Overall ADL's : Needs assistance/impaired Eating/Feeding: Modified independent   Grooming: Minimal assistance;Standing;Min guard;Oral care Grooming Details (indicate cue type and reason): min guard assist for standing balance Upper Body Bathing: Set up;Sitting   Lower Body Bathing: Min guard;Sitting/lateral leans   Upper Body Dressing : Modified independent;Sitting   Lower Body Dressing: Minimal assistance;Sit to/from stand Lower Body Dressing Details (indicate cue type and reason): able to don socks sitting, requires assist for sit <>stand Toilet Transfer: Minimal assistance;Ambulation;Comfort height toilet;RW   Toileting- Clothing Manipulation and Hygiene: Minimal assistance;Sit to/from stand Toileting - Clothing Manipulation Details (indicate cue type and reason): min A for balance     Functional mobility during ADLs: Minimal assistance;Rolling walker;Cueing for safety       Vision Patient Visual Report: Blurring of vision;Other (comment)(missing things on the left) Vision Assessment?: Yes Eye Alignment: Within Functional Limits Ocular Range of Motion: Within Functional Limits Alignment/Gaze Preference: Within Defined Limits Tracking/Visual Pursuits: Decreased smoothness of horizontal tracking;Impaired - to be further tested in functional context Diplopia Assessment: (denies) Additional Comments: Pt reports that when he goes to look at his phone he is missing the first few letters of things. His reports are confusing - his vision needs to be investigated further in functional context - no reading  material in room this session     Perception     Praxis      Pertinent Vitals/Pain Pain Assessment:  No/denies pain Pain Intervention(s): Monitored during session     Hand Dominance Right   Extremity/Trunk Assessment Upper Extremity Assessment Upper Extremity Assessment: Overall WFL for tasks assessed(no noticeable difference between L and R; fatigues)   Lower Extremity Assessment Lower Extremity Assessment: Defer to PT evaluation       Communication Communication Communication: No difficulties   Cognition Arousal/Alertness: Awake/alert Behavior During Therapy: WFL for tasks assessed/performed Overall Cognitive Status: Within Functional Limits for tasks assessed                                     General Comments  Daughter present throughout session    Exercises     Shoulder Instructions      Home Living Family/patient expects to be discharged to:: Private residence Living Arrangements: Other relatives(uncle) Available Help at Discharge: Family;Friend(s);Available PRN/intermittently Type of Home: House Home Access: Stairs to enter [employer] of Steps: 3 Entrance Stairs-Rails: None Home Layout: One level     Bathroom Shower/Tub: Teacher, early years/pre: Standard     Home Equipment: Cane - single point   Additional Comments: also has local dtr that checks on him, 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, sometimes other hours  Lives With: Family    Prior Functioning/Environment Level of Independence: Independent        Comments: works in [employer] at [employer] List: Decreased strength;Decreased activity tolerance;Impaired balance (sitting and/or standing);Impaired vision/perception;Decreased safety awareness;Decreased knowledge of use of DME or AE      OT Treatment/Interventions: Self-care/ADL training;DME and/or AE instruction;Modalities;Therapeutic activities;Visual/perceptual remediation/compensation;Patient/family education;Balance training    OT Goals(Current goals can be found in the care plan  section) Acute Rehab OT Goals Patient Stated Goal: to get better OT Goal Formulation: With patient/family Time For Goal Achievement: 08/31/17 Potential to Achieve Goals: Good ADL Goals Pt Will Perform Grooming: with modified independence;standing Pt Will Perform Lower Body Dressing: with modified independence;sit to/from stand Pt Will Transfer to Toilet: with modified independence;ambulating Pt Will Perform Toileting - Clothing Manipulation and hygiene: with modified independence;sit to/from stand  OT Frequency: Min 3X/week   Barriers to D/C:            Co-evaluation              AM-PAC PT "6 Clicks" Daily Activity     Outcome Measure Help from another person eating meals?: None Help from another person taking care of personal grooming?: A Little Help from another person toileting, which includes using toliet, bedpan, or urinal?: A Little Help from another person bathing (including washing, rinsing, drying)?: A Little Help from another person to put on and taking off regular upper body clothing?: None Help from another person to put on and taking off regular lower body clothing?: A Little 6 Click Score: 20   End of Session Equipment Utilized During Treatment: Gait belt;Rolling walker Nurse Communication: Mobility status;Precautions  Activity Tolerance: Patient tolerated treatment well Patient left: in bed;with call bell/phone within reach;with family/visitor present;with bed alarm set  OT Visit Diagnosis: Unsteadiness on feet (R26.81);Other abnormalities of gait and mobility (R26.89);Muscle weakness (generalized) (M62.81);Hemiplegia and hemiparesis Hemiplegia - Right/Left: Right Hemiplegia - dominant/non-dominant: Dominant Hemiplegia - caused by: Cerebral infarction  Time: 7588-3254 OT Time Calculation (min): 17 min Charges:  OT General Charges $OT Visit: 1 Visit OT Evaluation $OT Eval Moderate Complexity: 1 Mod G-Codes:     Hulda Humphrey  OTR/L [place] 08/17/2017, 6:01 PM

## 2017-08-17 NOTE — Progress Notes (Addendum)
ADDENDUM:  Addendum from today's progress note. Pt had CTA chest aorta (see below). I also discussed with Dr. Ashley Royalty in radiology and reviewed images with him over the phone. There are extensive soft plaque with ulceration at ascending, arch and descending aorta. I specifically concerned about the one at junction of aortic arch and descending aorta which protruding to the aortic lumen and had small neck and long body floating in the lumen. Although it is distal to the left subclavian and left CCA origin, the facts that turbulent flow in the aortic arch and pt had aortic valve mild to moderate regurgitation on TTE, make me think this is could be a potential source of pt embolic strokes.   I also discussed with Dr. Florene Glen, and I would favor anticoagulation at this time and repeat CTA aorta in 3 months. I recommend coumadin with lovenox bridge and INR goal 2-3. He will follow up in stroke clinic, and repeat CTA aorta in 3 months to decide further course of anticoagulation or antiplatelet. Due to extensive PVD and intracranial stenosis, also recommend ASA 81 on top of coumadin. Will cancel TEE and loop recorder at this time.   Neurology will sign off. Please call with questions. Pt will follow up with stroke clinic NP at [facility] in about 4 weeks. Thanks for the consult.   Rosalin Hawking, MD PhD Stroke Neurology 08/17/2017 4:35 PM   Ct Angio Chest Aorta W/cm &/or Wo/cm Result Date: 08/17/2017 CLINICAL DATA:  Nontraumatic aortic disease. EXAM: CT ANGIOGRAPHY CHEST WITH CONTRAST TECHNIQUE: Multidetector CT imaging of the chest was performed using the standard protocol during bolus administration of intravenous contrast. Multiplanar CT image reconstructions and MIPs were obtained to evaluate the vascular anatomy. CONTRAST:  <See Chart> ISOVUE-370 IOPAMIDOL (ISOVUE-370) INJECTION 76% COMPARISON:  CT abdomen pelvis 07/08/2017 FINDINGS: Cardiovascular: 4.5 cm ascending thoracic aortic aneurysm with soft plaque along  the ascending thoracic aorta, arch and to a lesser degree the descending thoracic aorta. Minimal ulcerated plaque posteriorly within the ascending thoracic aorta, series 5/71. No dissection is visualized. Conventional branch pattern of the great vessels atherosclerosis. No significant stenosis. Left vertebral arterial dominance noted. Heart is top-normal without pericardial effusion or thickening. Mediastinum/Nodes: Normal thyroid without mass. Patent midline trachea and mainstem bronchi. Mild esophageal thickening proximally query esophagitis. No large central pulmonary embolus. Lungs/Pleura: Bilateral centrilobular and paraseptal emphysema. No dominant mass. Dependent bibasilar atelectasis and mild pleural thickening along the left major fissure. Upper Abdomen: Nonspecific 1.6 x 1 cm hyperdense nodule of the left adrenal gland. Simple cyst noted off the upper pole of the right kidney measuring 2 cm. An interpolar left renal cyst is also noted too small to further characterize measuring approximately 7 mm. Numerous gallstones are present within the gallbladder. Remainder of the abdomen is unremarkable. Musculoskeletal: No chest wall abnormality. No acute or significant osseous findings. Review of the MIP images confirms the above findings. IMPRESSION: 1. 4.5 cm ascending thoracic aortic aneurysm with soft plaque as above described, some which is minimally ulcerated along the posterior aspect of the distal ascending aorta. No dissection. Recommend semi-annual imaging followup by CTA or MRA and referral to cardiothoracic surgery if not already obtained. This recommendation follows 2010 ACCF/AHA/AATS/ACR/ASA/SCA/SCAI/SIR/STS/SVM Guidelines for the Diagnosis and Management of Patients With Thoracic Aortic Disease. Circulation. 2010; 121: V494-W967 2. Mild cardiomegaly without pericardial effusion or thickening. 3. No acute pulmonary embolus. 4. Bilateral centrilobular and paraseptal emphysema with dependent bibasilar  atelectasis. 5. Indeterminate left adrenal nodule measuring 1.6 x 1 cm. This  could be further correlated with dedicated CT or MRI. 6. Uncomplicated cholelithiasis. 7. Bilateral renal cysts. Aortic aneurysm NOS (ICD10-I71.9). Aortic Atherosclerosis (ICD10-I70.0) and Emphysema (ICD10-J43.9). Electronically Signed   By: Ashley Royalty M.D.   On: 08/17/2017 14:51

## 2017-08-17 NOTE — Progress Notes (Signed)
STROKE TEAM PROGRESS NOTE   SUBJECTIVE (INTERVAL HISTORY) No family is at the bedside.  Overall Thomas [name] feels his condition is gradually improving. Still has gait imbalance and right leg ataxia. HA resolved but still complains of right facial numbness and left leg numbness. Mild hoarseness.    OBJECTIVE Temp:  [97.4 F (36.3 C)-98.2 F (36.8 C)] 97.9 F (36.6 C) (05/24 1147) Pulse Rate:  [71-91] 82 (05/24 0419) Cardiac Rhythm: Normal sinus rhythm;Bundle branch block (05/24 0721) Resp:  [13-18] 18 (05/24 0419) BP: (104-174)/(75-93) 149/82 (05/24 0419) SpO2:  [97 %-100 %] 97 % (05/24 0419)  No results for input(s): GLUCAP in the last 168 hours. Recent Labs  Lab 08/15/17 1016 08/16/17 0625 08/17/17 0440  NA 138 138 139  K 4.2 4.0 4.4  CL 103 106 105  CO2 26 25 26   GLUCOSE 95 86 90  BUN 18 15 16   CREATININE 1.41* 1.27* 1.39*  CALCIUM 9.5 9.0 9.0  MG  --   --  1.8   Recent Labs  Lab 08/15/17 1016  AST 25  ALT 20  ALKPHOS 113  BILITOT 0.7  PROT 8.9*  ALBUMIN 3.5   Recent Labs  Lab 08/15/17 1016 08/16/17 0625 08/17/17 0440  WBC 11.8* 11.9* 11.7*  NEUTROABS 6.1  --   --   HGB 13.3 12.2* 11.6*  HCT 42.2 37.9* 36.3*  MCV 78.9 78.1 78.4  PLT 383 356 332   No results for input(s): CKTOTAL, CKMB, CKMBINDEX, TROPONINI in the last 168 hours. Recent Labs    08/15/17 1016  LABPROT 13.2  INR 1.01   Recent Labs    08/15/17 1526 08/16/17 1516  COLORURINE YELLOW YELLOW  LABSPEC 1.020 1.018  PHURINE 5.0 5.0  GLUCOSEU NEGATIVE NEGATIVE  HGBUR SMALL* NEGATIVE  BILIRUBINUR NEGATIVE NEGATIVE  KETONESUR NEGATIVE NEGATIVE  PROTEINUR 30* NEGATIVE  NITRITE NEGATIVE NEGATIVE  LEUKOCYTESUR NEGATIVE NEGATIVE       Component Value Date/Time   CHOL 174 08/16/2017 0625   TRIG 94 08/16/2017 0625   HDL 40 (L) 08/16/2017 0625   CHOLHDL 4.4 08/16/2017 0625   VLDL 19 08/16/2017 0625   LDLCALC 115 (H) 08/16/2017 0625   Lab Results  Component Value Date   HGBA1C 5.6 06/29/2017    No results found for: LABOPIA, COCAINSCRNUR, LABBENZ, AMPHETMU, THCU, LABBARB  No results for input(s): ETH in the last 168 hours.  I have personally reviewed the radiological images below and agree with the radiology interpretations.  Ct Angio Head W Or Wo Contrast  Result Date: 08/16/2017 CLINICAL DATA:  Stroke follow-up EXAM: CT ANGIOGRAPHY HEAD AND NECK TECHNIQUE: Multidetector CT imaging of the head and neck was performed using the standard protocol during bolus administration of intravenous contrast. Multiplanar CT image reconstructions and MIPs were obtained to evaluate the vascular anatomy. Carotid stenosis measurements (when applicable) are obtained utilizing NASCET criteria, using the distal internal carotid diameter as the denominator. CONTRAST:  43mL ISOVUE-370 IOPAMIDOL (ISOVUE-370) INJECTION 76% COMPARISON:  Head CT 08/15/2017 FINDINGS: CTA NECK FINDINGS AORTIC ARCH: There is moderate low density atherosclerosis of the aortic arch. On the most inferior image of the study, there is a small central filling defect within the aortic arch. This is unchanged compared to 07/03/2017. There is no aneurysm, dissection or hemodynamically significant stenosis of the visualized ascending aorta and aortic arch. Conventional 3 vessel aortic branching pattern. Moderate stenosis of the origin of the left subclavian artery which is otherwise normal. RIGHT CAROTID SYSTEM: --Common carotid artery: Widely patent origin without common  carotid artery dissection or aneurysm. --Internal carotid artery: No dissection, occlusion or aneurysm. No hemodynamically significant stenosis. --External carotid artery: No acute abnormality. LEFT CAROTID SYSTEM: --Common carotid artery: Widely patent origin without common carotid artery dissection or aneurysm. --Internal carotid artery:No dissection, occlusion or aneurysm. No hemodynamically significant stenosis. --External carotid artery: No acute abnormality. VERTEBRAL  ARTERIES: Left dominant configuration. Severe narrowing of the origin of the right vertebral artery, unchanged. The right vertebral artery remains diminutive along its entire course and terminates in the right PICA. The left vertebral artery origin is normal. The left vertebral artery is normal to the skull base. SKELETON: There is no bony spinal canal stenosis. No lytic or blastic lesion. OTHER NECK: Likely paralysis of the right vocal cord. UPPER CHEST: Biapical emphysema. CTA HEAD FINDINGS ANTERIOR CIRCULATION: --Intracranial internal carotid arteries: Atherosclerotic calcification of the internal carotid arteries at the skull base without hemodynamically significant stenosis. --Anterior cerebral arteries: Normal. Both A1 segments are present. Patent anterior communicating artery. --Middle cerebral arteries: Chronic occlusion of the right M1 segment. --Posterior communicating arteries: Absent bilaterally. POSTERIOR CIRCULATION: --Basilar artery: Normal. --Posterior cerebral arteries: Normal. --Superior cerebellar arteries: Normal. --Inferior cerebellar arteries: Normal anterior and posterior inferior cerebellar arteries. VENOUS SINUSES: As permitted by contrast timing, patent. ANATOMIC VARIANTS: None DELAYED PHASE: No parenchymal contrast enhancement. Review of the MIP images confirms the above findings. IMPRESSION: 1. No new emergent large vessel occlusion. 2. 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 3. Unchanged severe stenosis or occlusion of the origin of the right vertebral artery, which is otherwise patent to the skull base. 4. No hemodynamically significant stenosis of the carotid arteries. 5. Bulky noncalcified plaque in the aortic arch with moderate stenosis of the proximal left subclavian artery. 6. Dilated right laryngeal ventricle and piriform sinus suggesting right-sided vocal cord paralysis. Aortic Atherosclerosis (ICD10-I70.0) and Emphysema (ICD10-J43.9). Electronically  Signed   By: Ulyses Jarred M.D.   On: 08/16/2017 21:26   Dg Chest 2 View  Result Date: 08/15/2017 CLINICAL DATA:  Patient admitted for stroke.  Weakness. EXAM: CHEST - 2 VIEW COMPARISON:  07/04/2017 FINDINGS: The heart size and mediastinal contours are within normal limits. Both lungs are clear. Subpleural scarring is seen along the periphery of the left lung base. Right-sided PICC line catheter terminates in the distal SVC. The visualized skeletal structures are unremarkable. IMPRESSION: No active cardiopulmonary disease. Electronically Signed   By: Ashley Royalty M.D.   On: 08/15/2017 14:18   Ct Head Wo Contrast  Result Date: 08/15/2017 CLINICAL DATA:  Recent stroke in April 2019. Difficulty walking without falling. Headache and lower extremity pain. EXAM: CT HEAD WITHOUT CONTRAST TECHNIQUE: Contiguous axial images were obtained from the base of the skull through the vertex without intravenous contrast. COMPARISON:  None. FINDINGS: Brain: No mass lesion, intraparenchymal hemorrhage or extra-axial collection. No evidence of acute cortical infarct. Bilateral old basal ganglia lacunar infarcts. There is periventricular hypoattenuation compatible with chronic microvascular disease. Vascular: Atherosclerotic calcification of the internal carotid arteries at the skull base. No hyperdense vessel. Skull: Normal visualized skull base, calvarium and extracranial soft tissues. Sinuses/Orbits: No fluid levels or advanced mucosal thickening of the visualized paranasal sinuses. No mastoid or middle ear effusion. The orbits are normal. IMPRESSION: Old bilateral basal ganglia lacunar infarcts and sequelae of chronic small vessel disease without acute intracranial abnormality. Electronically Signed   By: Ulyses Jarred M.D.   On: 08/15/2017 14:29   Ct Angio Neck W Or Wo Contrast  Result Date: 08/16/2017 CLINICAL  DATA:  Stroke follow-up EXAM: CT ANGIOGRAPHY HEAD AND NECK TECHNIQUE: Multidetector CT imaging of the head and  neck was performed using the standard protocol during bolus administration of intravenous contrast. Multiplanar CT image reconstructions and MIPs were obtained to evaluate the vascular anatomy. Carotid stenosis measurements (when applicable) are obtained utilizing NASCET criteria, using the distal internal carotid diameter as the denominator. CONTRAST:  59mL ISOVUE-370 IOPAMIDOL (ISOVUE-370) INJECTION 76% COMPARISON:  Head CT 08/15/2017 FINDINGS: CTA NECK FINDINGS AORTIC ARCH: There is moderate low density atherosclerosis of the aortic arch. On the most inferior image of the study, there is a small central filling defect within the aortic arch. This is unchanged compared to 07/03/2017. There is no aneurysm, dissection or hemodynamically significant stenosis of the visualized ascending aorta and aortic arch. Conventional 3 vessel aortic branching pattern. Moderate stenosis of the origin of the left subclavian artery which is otherwise normal. RIGHT CAROTID SYSTEM: --Common carotid artery: Widely patent origin without common carotid artery dissection or aneurysm. --Internal carotid artery: No dissection, occlusion or aneurysm. No hemodynamically significant stenosis. --External carotid artery: No acute abnormality. LEFT CAROTID SYSTEM: --Common carotid artery: Widely patent origin without common carotid artery dissection or aneurysm. --Internal carotid artery:No dissection, occlusion or aneurysm. No hemodynamically significant stenosis. --External carotid artery: No acute abnormality. VERTEBRAL ARTERIES: Left dominant configuration. Severe narrowing of the origin of the right vertebral artery, unchanged. The right vertebral artery remains diminutive along its entire course and terminates in the right PICA. The left vertebral artery origin is normal. The left vertebral artery is normal to the skull base. SKELETON: There is no bony spinal canal stenosis. No lytic or blastic lesion. OTHER NECK: Likely paralysis of the  right vocal cord. UPPER CHEST: Biapical emphysema. CTA HEAD FINDINGS ANTERIOR CIRCULATION: --Intracranial internal carotid arteries: Atherosclerotic calcification of the internal carotid arteries at the skull base without hemodynamically significant stenosis. --Anterior cerebral arteries: Normal. Both A1 segments are present. Patent anterior communicating artery. --Middle cerebral arteries: Chronic occlusion of the right M1 segment. --Posterior communicating arteries: Absent bilaterally. POSTERIOR CIRCULATION: --Basilar artery: Normal. --Posterior cerebral arteries: Normal. --Superior cerebellar arteries: Normal. --Inferior cerebellar arteries: Normal anterior and posterior inferior cerebellar arteries. VENOUS SINUSES: As permitted by contrast timing, patent. ANATOMIC VARIANTS: None DELAYED PHASE: No parenchymal contrast enhancement. Review of the MIP images confirms the above findings. IMPRESSION: 1. No new emergent large vessel occlusion. 2. 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 3. Unchanged severe stenosis or occlusion of the origin of the right vertebral artery, which is otherwise patent to the skull base. 4. No hemodynamically significant stenosis of the carotid arteries. 5. Bulky noncalcified plaque in the aortic arch with moderate stenosis of the proximal left subclavian artery. 6. Dilated right laryngeal ventricle and piriform sinus suggesting right-sided vocal cord paralysis. Aortic Atherosclerosis (ICD10-I70.0) and Emphysema (ICD10-J43.9). Electronically Signed   By: Ulyses Jarred M.D.   On: 08/16/2017 21:26   Mr Brain Wo Contrast  Result Date: 08/16/2017 CLINICAL DATA:  Acute onset inability to walk 3 days ago. Recent endovascular repair of ruptured aortic aneurysm, bilateral iliofemoral endarterectomies. History of hypertension. EXAM: MRI HEAD WITHOUT CONTRAST TECHNIQUE: Multiplanar, multiecho pulse sequences of the brain and surrounding structures were obtained without  intravenous contrast. COMPARISON:  CT HEAD Aug 15, 2017 and MRI of the head June 28, 2017 FINDINGS: INTRACRANIAL CONTENTS: 3 new subcentimeter foci of reduced diffusion within RIGHT cerebellum, LEFT cerebellum and RIGHT cervicomedullary junction, identifiable lesions demonstrate low ADC values.No susceptibility artifact to suggest acute  blood products. Old bilateral basal ganglia infarcts with ex vacuo dilatation subjacent lateral ventricle. Old bilateral thalami small infarcts. Old small cerebellar infarcts. Borderline parenchymal brain volume loss for age. No hydrocephalus. No midline shift, mass effect or masses. VASCULAR: T2 bright signal RIGHT vertebral artery. SKULL AND UPPER CERVICAL SPINE: No abnormal sellar expansion. No suspicious calvarial bone marrow signal. Craniocervical junction maintained. SINUSES/ORBITS: Trace paranasal sinus mucosal thickening. Mastoid air cells are well aerated.The included ocular globes and orbital contents are non-suspicious. OTHER: None. IMPRESSION: 1. Acute subcentimeter infarcts bilateral cerebella and RIGHT cervicomedullary junction. 2. Slow flow versus occluded RIGHT vertebral artery. 3. 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 Old small cerebellar infarcts. Electronically Signed   By: Elon Alas M.D.   On: 08/16/2017 00:32   Ct Angio Abd/pel W And/or Wo Contrast  Result Date: 08/15/2017 CLINICAL DATA:  62 year old male with a history of ruptured abdominal aortic aneurysm, endovascular repair performed 07/01/2017 EXAM: CTA ABDOMEN AND PELVIS wITHOUT AND WITH CONTRAST TECHNIQUE: Multidetector CT imaging of the abdomen and pelvis was performed using the standard protocol during bolus administration of intravenous contrast. Multiplanar reconstructed images and MIPs were obtained and reviewed to evaluate the vascular anatomy. CONTRAST:  100 cc  ISOVUE-370 IOPAMIDOL (ISOVUE-370) INJECTION 76% COMPARISON:  07/08/2017, 07/03/2017, 07/01/2017 FINDINGS:  VASCULAR Aorta: Re- demonstration of endovascular repair of ruptured infrarenal abdominal aortic aneurysm, main device from the right and the contralateral gate from the left. Infrarenal fixation, with the graft margin just below the lowest, left, renal artery. No evidence of a type 1 or a type 2 endoleak. Intense differential enhancement of the wall of the excluded aneurysm on the delayed images. Significantly decreased size of the retroperitoneal hematoma adjacent to the psoas muscle. Greatest diameter today measures 3.0 cm x 3.4 cm on the axial images. Fluid extends inferiorly in a cone like configuration lateral to the left psoas muscle overlying the iliacus. Near complete resolution of hematoma surrounding the left iliac vasculature. The right limb terminates within the common iliac artery. The left limb terminates within the left common iliac artery. Greatest diameter of the excluded aneurysm sac measures 7.2 cm (image 23 of series 15), which is larger than the comparison CT studies. Interval resolution of gas within the aneurysm sac. Celiac: Celiac arteries patent. No significant atherosclerotic changes at the origin. Traditional branch pattern of the celiac artery. Accessory left hepatic artery. SMA: Superior mesenteric artery is patent with no significant atherosclerotic changes at the origin. Renals: Main left and right renal arteries are patent. Again, irregularity at the proximal right renal artery may represent a fusiform aneurysm, with associated stenosis. Small accessory left renal artery above the main renal artery. Main renal artery appears patent, just above the proximal stent graft. IMA: IMA appears occluded at the origin. Left colic artery and superior rectal artery are patent via collateral flow. Right lower extremity: The right iliac limb is patent, with near complete interval resolution of the mural thrombus/thickening, with maintained flow channel. No evidence of a type 1 B endoleak. The  hypogastric artery appears occluded in the interval. External iliac artery is patent. Surgical changes at the right common femoral artery. Proximal profunda femoris and superficial femoral artery patent. Left lower extremity: Left iliac limb is patent. No evidence of a type 1 B endoleak. Left hypogastric artery and external iliac artery are patent. Proximal SFA and profunda femoris patent. Veins: Unremarkable appearance of the venous system. Review of the MIP images confirms the above findings. NON-VASCULAR Lower chest: Evidence  of mild interlobular septal thickening. Linear opacities at the lung bases. Calcified granuloma of the right middle lobe. Hepatobiliary: Unremarkable appearance of the liver. Hyperdense material within the gallbladder lumen compatible with cholelithiasis. No evidence of associated inflammatory changes. Pancreas: Unremarkable pancreas Spleen: Unremarkable spleen Adrenals/Urinary Tract: Enhancing lesion of the left adrenal gland. Unremarkable appearance of the right adrenal gland. Right: No right-sided hydronephrosis. Similar appearance of cystic lesion at the superior right kidney. No nephrolithiasis. Unremarkable course the right ureter. Left: No left-sided hydronephrosis. 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 Unremarkable course the left ureter. Urinary bladder partially distended. Stomach/Bowel: Unremarkable stomach . Unremarkable small bowel. No abnormal distention. Moderate stool burden. No transition point. No inflammatory changes. Normal appendix. Colonic diverticular present without associated inflammation. No inflammatory changes within the small bowel mesenteric. Lymphatic: No lymphadenopathy. Reproductive: Unremarkable appearance of the pelvic organs. Other: Small fat containing umbilical hernia. Musculoskeletal: No acute displaced fracture. Mild degenerative changes of the spine. IMPRESSION: Re-demonstration of endovascular repair of ruptured infrarenal abdominal  aortic aneurysm. No evidence of endoleak or stent graft migration. The aneurysm sac demonstrates no internal enhancement or continued flow, however, there is intense mural enhancement on the delayed images, and the greatest diameter of the aneurysm sac has enlarged to a greatest diameter of 7.2 cm, previously 6.6 cm on presentation. The etiology of the enlargement is uncertain, potentially reactive or post inflammatory. Although gas has resolved in the interval, seen on the prior and most likely postoperative changes, infection cannot be excluded. There has been near complete resolution of the right limb mural thrombus, with patency maintained of the bilateral iliac limbs. Interval occlusion of the right hypogastric artery. Left hypogastric artery remains patent. Interval improvement of retroperitoneal hematoma, with near complete resolution adjacent to the iliac vasculature, and small persisting fluid collection lateral to the left psoas muscle. These results were called by telephone at the time of interpretation on 08/15/2017 at 4:09 pm to Dr. Jeannie Done PFEIFFER. 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 Correlation with duplex may be useful. Cholelithiasis. Evidence of mild pulmonary edema. Signed, Dulcy Fanny. Earleen Newport, DO [facility] Radiology Electronically Signed   By: Corrie Mckusick D.O.   On: 08/15/2017 16:09   CTA chest aorta - pending  TTE  - Left ventricle: The cavity size was normal. There was moderate   concentric hypertrophy. Systolic function was vigorous. The   estimated ejection fraction was in the range of 65% to 70%. Wall   motion was normal; there were no regional wall motion   abnormalities. The study is not technically sufficient to allow   evaluation of LV diastolic function. - Aortic valve: There was mild to moderate regurgitation. - Atrial septum: There was  increased thickness of the septum,   consistent with lipomatous hypertrophy. - Tricuspid valve: There was trivial regurgitation.   PHYSICAL EXAM  Temp:  [97.4 F (36.3 C)-98.2 F (36.8 C)] 97.9 F (36.6 C) (05/24 1147) Pulse Rate:  [71-91] 82 (05/24 0419) Resp:  [13-18] 18 (05/24 0419) BP: (104-174)/(75-93) 149/82 (05/24 0419) SpO2:  [97 %-100 %] 97 % (05/24 0419)  General - Well nourished, well developed, in no apparent distress.  Ophthalmologic - fundi not visualized due to noncooperation.  Cardiovascular - Regular rate and rhythm with no murmur.  Mental Status -  Level of arousal and orientation to time, place, and person were intact. Language including expression, naming, repetition, comprehension was assessed and found intact.  Fund of Knowledge was assessed and was intact.  Cranial Nerves II - XII - II - Visual field intact OU. III, IV, VI - Extraocular movements intact. V - right facial decreased sensation. VII - Facial movement intact bilaterally. VIII - Hearing & vestibular intact bilaterally, chronic left hearing loss. X - Palate elevates symmetrically, mild hoarseness. XI - Chin turning & shoulder shrug intact bilaterally. XII - Tongue protrusion intact.  Motor Strength - The patient's strength was normal in all extremities and pronator drift was absent.  Bulk was normal and fasciculations were absent.   Motor Tone - Muscle tone was assessed at the neck and appendages and was normal.  Reflexes - The patient's reflexes were symmetrical in all extremities and Thomas [name] had no pathological reflexes.  Sensory - Light touch, temperature/pinprick were assessed and were symmetrical except left LE decreased sensation.    Coordination - The patient had normal movements in the hands and left leg with no ataxia or dysmetria. However, right LE HTS ataxic. Tremor was absent.  Gait and Station - deferred.   ASSESSMENT/PLAN [name] is a 62 y.o. male with history of  hypertension, tobacco use, recent stroke and AAA aneurysm rupture s/ repair and stenting admitted for generalized weakness and right leg ataxia. No tPA given due to OSW.    Stroke:  bilateral cerebellar and right lateral medullary small infarcts embolic, secondary to unknown source. Could be due to extensive aortic atherosclerosis vs. cardioembolic  Resultant right leg ataxia, right wallenberg syndrome  MRI  B/l cerebellar and right lateral medullary infarcts  CTA head and neck chronic right M1 occlusion and right VA origin stenosis  CTA chest aortic pending to rule out aortic thrombus  If no aortic thrombus, consider outpt TEE and loop recorder  2D Echo  EF 65-70%  LDL 115  SCDs for VTE prophylaxis  aspirin 81 mg daily prior to admission, now on aspirin 325 mg daily and clopidogrel 75 mg daily. Continue DAPT for 3 months and then plavix alone given extensive aortic atherosclerosis  Patient counseled to be compliant with his antithrombotic medications  Ongoing aggressive stroke risk factor management  Therapy recommendations:  CIR  Disposition:  Pending  Hx of stroke  06/28/17 MRI with and without contrast showed 3 acute small left cerebellar infarcts, old small bilateral cerebellar infarcts as well as old BG and thalamus infarcts.    MRA head and neck revealed right M1 chronic occlusion, bilateral PCA stenosis, origin right VA stenosis, no mycotic aneurysm.    EF 60 to 65%.    LDL 72 and A1c 5.6.    Initially plan for TEE to rule out endocarditis, however, patient deemed high risk, then started empiric treatment of Rocephin for 6 weeks.    Discharged on aspirin and Lipitor.   PVD  06/28/17 AAA rupture s/p emergent endovascular repair, bilateral iliofemoral endarterectomy, and BLE thrumboembolectomies.    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   Symptoms consistent with PVD, no further surgery performed at that time.    Patient numbness and weakness on the right LE gradually improved over time.    CTA abd/pelvis 08/15/17 - stable stent and resolved right in stent thrombosis  CTA neck - extensive aorta soft plaque and atherosclerosis  CTA chest aorta pending  Hypertension Stable Permissive hypertension (OK if <220/120) for 24-48 hours post stroke and then gradually normalized within 5-7  days.  Long term BP goal 130-150 due to right M1 chronic occlusion  Avoid low BP  Hyperlipidemia  Home meds:  lipitor    LDL 115, goal < 70  Now on lipitor 80  Continue statin at discharge  Tobacco abuse  Current smoker  Smoking cessation counseling provided  Pt is willing to quit  Other Stroke Risk Factors  Other Active Problems  Possible left meniere's disease  Hospital day # 0  I spent  35 minutes in total face-to-face time with the patient, more than 50% of which was spent in counseling and coordination of care, reviewing test results, images and medication, and discussing the diagnosis of recurrent embolic strokes, treatment plan and potential prognosis. This patient's care requiresreview of multiple databases, neurological assessment, discussion with family, other specialists and medical decision making of high complexity.  Rosalin Hawking, MD PhD Stroke Neurology 08/17/2017 2:46 PM    To contact Stroke Continuity provider, please refer to [email or web address] After hours, contact General Neurology

## 2017-08-17 NOTE — Progress Notes (Signed)
Physical Therapy Treatment Patient Details Name: Thomas [name] MRN: [id number] DOB: Sep 03, 1955 Today's Date: 08/17/2017    History of Present Illness  62 yo M with hx of CKD stage III,  CVA, s/p emergent repair ruptured AAA, s/p bilateral iliofemoral endarterectomy and thromboembolectomy on 4/7 representing with difficulty ambulation, pelvic pain, and headache; CT negative; MRI=Acute subcentimeter infarcts bilateral cerebella and RIGHT    PT Comments    Patient continues to present with R sided weakness and balance deficits increasing risk for falls. Current plan remains appropriate.    Follow Up Recommendations  CIR     Equipment Recommendations  Rolling walker with 5" wheels    Recommendations for Other Services       Precautions / Restrictions Precautions Precautions: Fall Restrictions Weight Bearing Restrictions: No    Mobility  Bed Mobility Overal bed mobility: Modified Independent Bed Mobility: Supine to Sit           General bed mobility comments: increased time and effort  Transfers Overall transfer level: Needs assistance Equipment used: Rolling walker (2 wheeled) Transfers: Sit to/from Stand Sit to Stand: Min assist         General transfer comment: assist for balance upon standing due to L sided weakness; cues for safe hand placement when standing to RW  Ambulation/Gait Ambulation/Gait assistance: Min assist;Mod assist Ambulation Distance (Feet): 70 Feet Assistive device: Rolling walker (2 wheeled) Gait Pattern/deviations: Step-through pattern;Decreased stride length;Narrow base of support;Decreased dorsiflexion - right;Ataxic Gait velocity: decreased   General Gait Details: cues for safe proximity to RW, sequencing, and increased R step height/length; pt with L knee buckling and lateral LOB X2 requiring increased assistance to gain balance   Stairs             Wheelchair Mobility    Modified Rankin (Stroke Patients Only) Modified Rankin  (Stroke Patients Only) Pre-Morbid Rankin Score: No symptoms Modified Rankin: Moderately severe disability     Balance Overall balance assessment: Needs assistance Sitting-balance support: No upper extremity supported;Feet supported Sitting balance-Leahy Scale: Fair       Standing balance-Leahy Scale: Poor Standing balance comment: heavy reliance on bilat UE support                            Cognition Arousal/Alertness: Awake/alert Behavior During Therapy: WFL for tasks assessed/performed Overall Cognitive Status: No family/caregiver present to determine baseline cognitive functioning                                        Exercises      General Comments        Pertinent Vitals/Pain Pain Assessment: No/denies pain    Home Living                      Prior Function Level of Independence: Independent          PT Goals (current goals can now be found in the care plan section) Acute Rehab PT Goals Patient Stated Goal: to get better PT Goal Formulation: With patient Time For Goal Achievement: 08/30/17 Potential to Achieve Goals: Good Progress towards PT goals: Progressing toward goals    Frequency    Min 4X/week      PT Plan Current plan remains appropriate    Co-evaluation              AM-PAC  PT "6 Clicks" Daily Activity  Outcome Measure  Difficulty turning over in bed (including adjusting bedclothes, sheets and blankets)?: A Little Difficulty moving from lying on back to sitting on the side of the bed? : A Little Difficulty sitting down on and standing up from a chair with arms (e.g., wheelchair, bedside commode, etc,.)?: A Little Help needed moving to and from a bed to chair (including a wheelchair)?: A Lot Help needed walking in hospital room?: A Lot Help needed climbing 3-5 steps with a railing? : A Lot 6 Click Score: 15    End of Session Equipment Utilized During Treatment: Gait belt Activity Tolerance:  Patient tolerated treatment well Patient left: in chair;with call bell/phone within reach Nurse Communication: Mobility status PT Visit Diagnosis: Difficulty in walking, not elsewhere classified (R26.2);Hemiplegia and hemiparesis Hemiplegia - Right/Left: Right Hemiplegia - dominant/non-dominant: Dominant Hemiplegia - caused by: Cerebral infarction     Time: 3428-7681 PT Time Calculation (min) (ACUTE ONLY): 25 min  Charges:  $Gait Training: 23-37 mins                    G Codes:       [name], PTA Pager: [phone]     [name] 08/17/2017, 4:03 PM

## 2017-08-17 NOTE — Progress Notes (Addendum)
Inpatient Rehabilitation  Met with patient at bedside to discuss team's recommendation for IP Rehab.  Shared booklets, insurance verification letter, and answered initial questions.  Patient's [employer] coverage is an IT trainer, reviewed this with him and provided him the contact information for the financial counselor.  Patient is eager to do the rehab program here and regain his independence.  I have a bed to offer tomorrow and plan to proceed with IP Rehab admission Saturday, 08/18/17.  Discussed plan with the team.  On Saturday, Dr. Letta Pate will clear for admission, if questions call charge nurse at [phone].    Carmelia Roller., CCC/SLP Admission Coordinator  [place]  Cell [phone]

## 2017-08-17 NOTE — PMR Pre-admission (Signed)
PMR Admission Coordinator Pre-Admission Assessment  Patient: Thomas [name] is an 62 y.o., male MRN: [id number] DOB: 1955/11/13 Height: 5\' 7"  (170.2 cm) Weight: 56.7 kg (125 lb)              Insurance Information HMO:     PPO:      PCP:      IPA:      80/20:      OTHER: Indemnity Plan  PRIMARYRenold Don       Policy#: [id number]      Subscriber: Self CM Name: N/A      Phone#: [phone]     Fax#: N/A Pre-Cert#: Not required for this policy      Employer: Full Time Benefits:  Phone #: [phone]     Name: Loa Socks 08/17/17, Reference [id number] Eff. Date: 03/27/17     Deduct: $0      Out of Pocket Max: $0      Life Max: $0 CIR: pays $200 a day, days 2+      SNF: pays $200 a day, days 2+ Outpatient: no coverage     Co-Pay:  Home Health: no coverage       Co-Pay:  DME: rental, 5 day max     Co-Pay: $20 per day Providers: In-network   SECONDARY: None        Emergency Contact Information Contact Information    Name Relation Home Work [place], [place] Daughter   [phone]     Current Medical History  Patient Admitting Diagnosis: Paraparesis lower extremity sensory changes and gait disorder likely had spinal cord ischemia related to AAA rupture, also has had a small cerebellar infarcts  History of Present Illness: [name] is a 62 year old male with history of CKD, ruptured aneurysm status post bilateral iliofemoral endarterectomy with thromboembolectomy 3/15 complicated by 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  He completed his IV antibiotic; however was admitted on 08/15/17 with 3-day history of bilateral lower extremity weakness with abdominal and bilateral groin pain.  CT abdomen pelvis done showed no evidence of leak and intense mural enhancement felt to be due to postop changes per Dr. Gwenlyn Saran.  MRI of brain was  repeated and showed acute subcentimeter infarcts bilateral cerebellar and right cervical medullary junction as well as slow flow versus occluded right-VA.  Neurology was consulted for input and recommended DAPT with ECHO/TEE to rule out cardioembolic source. 2D echo done revealing EF 65-70% with moderate AVR.  CTA chest pending.  Therapy evaluations done revealing functional deficits and CIR recommended for follow up therapy.  Discussed with acute attending and neurology team and anticipate that work up will be completed later today, 08/17/17 and that patient will be medically ready for IP Rehab admission tomorrow, Saturday 08/18/17.     NIH Total: 4    Past Medical History  Past Medical History:  Diagnosis Date  . Hypertension   . Stroke [facility])     Family History  family history is not on file.  Prior Rehab/Hospitalizations:  Has the patient had major surgery during 100 days prior to admission? Yes  Current Medications   Current Facility-Administered Medications:  .  acetaminophen (TYLENOL) tablet 650 mg, 650 mg, Oral, Q6H PRN, 650 mg at 08/17/17 0348 **OR** acetaminophen (TYLENOL) suppository 650 mg, 650 mg, Rectal, Q6H PRN, Black, Karen M, NP .  aspirin EC tablet 325 mg,  325 mg, Oral, Daily, Rosalin Hawking, MD .  atorvastatin (LIPITOR) tablet 80 mg, 80 mg, Oral, q1800, Elodia Florence., MD, 80 mg at 08/16/17 1722 .  bisacodyl (DULCOLAX) suppository 10 mg, 10 mg, Rectal, Daily PRN, Black, Karen M, NP .  clopidogrel (PLAVIX) tablet 75 mg, 75 mg, Oral, Daily, Nyanor, Pearl O, NP, 75 mg at 08/16/17 1237 .  diphenhydrAMINE (BENADRYL) capsule 25 mg, 25 mg, Oral, Q8H PRN, Black, Karen M, NP .  iopamidol (ISOVUE-370) 76 % injection 50 mL, 50 mL, Intravenous, Once PRN, Elodia Florence., MD .  iopamidol (ISOVUE-370) 76 % injection 50 mL, 50 mL, Intravenous, Once, Elodia Florence., MD .  iopamidol (ISOVUE-370) 76 % injection, , , ,  .  labetalol (NORMODYNE,TRANDATE) injection 10  mg, 10 mg, Intravenous, Q2H PRN, Elodia Florence., MD .  ondansetron [facility]) tablet 4 mg, 4 mg, Oral, Q6H PRN **OR** ondansetron (ZOFRAN) injection 4 mg, 4 mg, Intravenous, Q6H PRN, Black, Karen M, NP .  pantoprazole (PROTONIX) EC tablet 40 mg, 40 mg, Oral, Q1200, Black, Karen M, NP, 40 mg at 08/16/17 1237 .  senna-docusate (Senokot-S) tablet 2 tablet, 2 tablet, Oral, QHS, Black, Lezlie Octave, NP, 2 tablet at 08/16/17 2130 .  traZODone (DESYREL) tablet 25 mg, 25 mg, Oral, QHS PRN, Black, Karen M, NP, 25 mg at 08/16/17 0108  Patients Current Diet:  Diet Order           Diet NPO time specified  Diet effective midnight        Diet Heart Room service appropriate? Yes; Fluid consistency: Thin  Diet effective now          Precautions / Restrictions Precautions Precautions: Fall Restrictions Weight Bearing Restrictions: No   Has the patient had 2 or more falls or a fall with injury in the past year?No  Prior Activity Level Community (5-7x/wk): Prior to admission patient was fully independent and worked.    Home Assistive Devices / Equipment Home Equipment: Kasandra Knudsen - single point  Prior Device Use: Indicate devices/aids used by the patient prior to current illness, exacerbation or injury? None of the above  Prior Functional Level Prior Function Level of Independence: Independent Comments: works in [place] [employer] [place]: Did the patient need help bathing, dressing, using the toilet or eating? Independent  Indoor Mobility: Did the patient need assistance with walking from room to room (with or without device)? Independent  Stairs: Did the patient need assistance with internal or external stairs (with or without device)? Independent  Functional Cognition: Did the patient need help planning regular tasks such as shopping or remembering to take medications? Independent  Current Functional Level Cognition  Arousal/Alertness: Awake/alert Overall Cognitive Status:  Impaired/Different from baseline Orientation Level: Oriented X4 Attention: Alternating Alternating Attention: Impaired Alternating Attention Impairment: Verbal basic, Functional basic Memory: Impaired Memory Impairment: Decreased recall of new information Awareness: Impaired Awareness Impairment: Anticipatory impairment Problem Solving: Impaired Problem Solving Impairment: Verbal complex, Functional complex Safety/Judgment: Appears intact    Extremity Assessment (includes Sensation/Coordination)  Upper Extremity Assessment: Defer to OT evaluation  Lower Extremity Assessment: RLE deficits/detail, LLE deficits/detail RLE Deficits / Details: hip 3+/5, knee 4/5;  ankle 4/5 AROM WFL RLE Coordination: decreased gross motor LLE Deficits / Details: strength grossly WFL; AROM WFL LLE Coordination: decreased gross motor    ADLs       Mobility  Overal bed mobility: Needs Assistance Bed Mobility: Supine to Sit Supine to sit: Supervision General bed mobility  comments: for safety and line management    Transfers  Overall transfer level: Needs assistance Equipment used: Rolling walker (2 wheeled) Transfers: Sit to/from Stand Sit to Stand: Min assist General transfer comment: min assist for anterior-superior wt shift and to stabilize once in standing with RW support    Ambulation / Gait / Stairs / Wheelchair Mobility  Ambulation/Gait Ambulation/Gait assistance: Min assist, Mod assist Ambulation Distance (Feet): 12 Feet(in room) Assistive device: Rolling walker (2 wheeled) Gait Pattern/deviations: Step-through pattern, Decreased stride length, Narrow base of support General Gait Details: assist for balance and to maneuver RW in room, multi-modal cues for step length, RW position, heavy use of UEs    Posture / Balance Balance Overall balance assessment: Needs assistance Sitting-balance support: No upper extremity supported, Feet supported Sitting balance-Leahy Scale: Fair Standing  balance-Leahy Scale: Poor Standing balance comment: heavily reliant on UE support for static stand; reliant on UEs and external assist for dynamic balance    Special needs/care consideration BiPAP/CPAP: No CPM: No Continuous Drip IV: No Dialysis: No         Life Vest: No Oxygen: No Special Bed: No Trach Size: No Wound Vac (area): No       Skin: WDL                                Bowel mgmt: Continent, last BM 08/17/17 Bladder mgmt: External catheter  Diabetic mgmt: No, HgbA1c 5.6     Previous Home Environment Living Arrangements: Other relatives(cousin )  Lives With: Family Available Help at Discharge: Family, Friend(s), Available PRN/intermittently Type of Home: House Home Layout: One level Home Access: Stairs to enter Entrance Stairs-Rails: None Entrance Stairs-Number of Steps: 3 Additional Comments: also has local dtr that checks on him, 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, sometimes other hours  Discharge Living Setting Plans for Discharge Living Setting: Lives with (comment)(family ) Type of Home at Discharge: House Discharge Home Layout: One level Discharge Home Access: Stairs to enter Entrance Stairs-Rails: None Entrance Stairs-Number of Steps: back: 2 Discharge Bathroom Shower/Tub: Tub/shower unit Discharge Bathroom Toilet: Standard Discharge Bathroom Accessibility: Yes How Accessible: Accessible via walker Does the patient have any problems obtaining your medications?: No  Social/Family/Support Systems Patient Roles: Other (Comment), Parent(cousin ) Contact Information: Daughter: Madlyn Frankel [phone] Anticipated Caregiver: Mod I goals, family available PRN Anticipated Caregiver's Contact Information: Family: cousin and daughter  Ability/Limitations of Caregiver: They work and can assist PRN Caregiver Availability: Intermittent Discharge Plan Discussed with Primary Caregiver: Yes Is Caregiver In Agreement with Plan?: Yes Does Caregiver/Family have Issues with  Lodging/Transportation while Pt is in Rehab?: No  Goals/Additional Needs Patient/Family Goal for Rehab: PT/OT/SLP: Mod I  Expected length of stay: 7-10 days  Cultural Considerations: Christian  Dietary Needs: Heart Healthy diet restrictions  Equipment Needs: TBD Pt/Family Agrees to Admission and willing to participate: Yes  Decrease burden of Care through IP rehab admission: No  Possible need for SNF placement upon discharge: No  Patient Condition: This patient's condition remains as documented in the consult dated 08/16/17 at 1535, in which the Rehabilitation Physician determined and documented that the patient's condition is appropriate for intensive rehabilitative care in an inpatient rehabilitation facility. Will admit to inpatient rehab tomorrow.  Preadmission Screen Completed By:  Gunnar Fusi, 08/17/2017 2:19 PM ______________________________________________________________________   Discussed status with Dr. Letta Pate on 08/17/17 at 1414 and received telephone approval for admission tomorrow.  Admission Coordinator:  Gunnar Fusi,  time 1414/Date 08/17/17

## 2017-08-17 NOTE — Progress Notes (Signed)
ANTICOAGULATION CONSULT NOTE - Initial Consult  Pharmacy Consult for Enoxaparin and Warfarin Indication: Soft plaque (clot) in ascending arch and descending aorta  No Known Allergies  Patient Measurements: Height: 5\' 7"  (170.2 cm) Weight: 125 lb (56.7 kg) IBW/kg (Calculated) : 66.1 Heparin Dosing Weight: 56.7 kg  Vital Signs: Temp: 97.6 F (36.4 C) (05/24 1543) Temp Source: Oral (05/24 1543)  Labs: Recent Labs    08/15/17 1016 08/16/17 0625 08/17/17 0440  HGB 13.3 12.2* 11.6*  HCT 42.2 37.9* 36.3*  PLT 383 356 332  APTT 33  --   --   LABPROT 13.2  --   --   INR 1.01  --   --   CREATININE 1.41* 1.27* 1.39*    Estimated Creatinine Clearance: 43.6 mL/min (A) (by C-G formula based on SCr of 1.39 mg/dL (H)).   Medical History: Past Medical History:  Diagnosis Date  . Hypertension   . Stroke [facility])      Assessment: 62 yo male with clot in aorta which was the likely source of his embolic stroke  Goal of Therapy:  INR 2-3 Monitor platelets by anticoagulation protocol: Yes   Plan:  Lovenox 60 mg subq q12hr Coumadin 5 mg po x 1 today Monitor CBC and follow up INR daily Monitor for signs and symptoms of bleeding  Corinda Gubler, PharmD, FCCM 08/17/2017,4:41 PM

## 2017-08-18 ENCOUNTER — Other Ambulatory Visit: Payer: Self-pay

## 2017-08-18 ENCOUNTER — Inpatient Hospital Stay (HOSPITAL_COMMUNITY)
Admission: RE | Admit: 2017-08-18 | Discharge: 2017-08-29 | DRG: 092 | Disposition: A | Discharge status: Home / Self Care | Payer: Private Health Insurance - Indemnity | Source: Intra-hospital | Attending: Physical Medicine & Rehabilitation | Admitting: Physical Medicine & Rehabilitation

## 2017-08-18 ENCOUNTER — Encounter (HOSPITAL_COMMUNITY): Payer: Self-pay | Admitting: *Deleted

## 2017-08-18 DIAGNOSIS — I700 Atherosclerosis of aorta: Secondary | ICD-10-CM | POA: Diagnosis present

## 2017-08-18 DIAGNOSIS — I129 Hypertensive chronic kidney disease with stage 1 through stage 4 chronic kidney disease, or unspecified chronic kidney disease: Secondary | ICD-10-CM | POA: Diagnosis present

## 2017-08-18 DIAGNOSIS — R42 Dizziness and giddiness: Secondary | ICD-10-CM

## 2017-08-18 DIAGNOSIS — G4700 Insomnia, unspecified: Secondary | ICD-10-CM | POA: Diagnosis present

## 2017-08-18 DIAGNOSIS — R2681 Unsteadiness on feet: Secondary | ICD-10-CM | POA: Diagnosis present

## 2017-08-18 DIAGNOSIS — I10 Essential (primary) hypertension: Secondary | ICD-10-CM | POA: Diagnosis present

## 2017-08-18 DIAGNOSIS — R2689 Other abnormalities of gait and mobility: Secondary | ICD-10-CM | POA: Diagnosis present

## 2017-08-18 DIAGNOSIS — I712 Thoracic aortic aneurysm, without rupture, unspecified: Secondary | ICD-10-CM

## 2017-08-18 DIAGNOSIS — Z23 Encounter for immunization: Secondary | ICD-10-CM

## 2017-08-18 DIAGNOSIS — E785 Hyperlipidemia, unspecified: Secondary | ICD-10-CM | POA: Diagnosis present

## 2017-08-18 DIAGNOSIS — Z87891 Personal history of nicotine dependence: Secondary | ICD-10-CM

## 2017-08-18 DIAGNOSIS — N181 Chronic kidney disease, stage 1: Secondary | ICD-10-CM | POA: Diagnosis present

## 2017-08-18 DIAGNOSIS — N189 Chronic kidney disease, unspecified: Secondary | ICD-10-CM | POA: Diagnosis present

## 2017-08-18 DIAGNOSIS — G8220 Paraplegia, unspecified: Secondary | ICD-10-CM | POA: Diagnosis present

## 2017-08-18 DIAGNOSIS — I69393 Ataxia following cerebral infarction: Secondary | ICD-10-CM | POA: Diagnosis not present

## 2017-08-18 DIAGNOSIS — N182 Chronic kidney disease, stage 2 (mild): Secondary | ICD-10-CM | POA: Diagnosis not present

## 2017-08-18 DIAGNOSIS — D649 Anemia, unspecified: Secondary | ICD-10-CM | POA: Diagnosis present

## 2017-08-18 DIAGNOSIS — I6930 Unspecified sequelae of cerebral infarction: Secondary | ICD-10-CM

## 2017-08-18 DIAGNOSIS — Z8673 Personal history of transient ischemic attack (TIA), and cerebral infarction without residual deficits: Secondary | ICD-10-CM

## 2017-08-18 DIAGNOSIS — Z79899 Other long term (current) drug therapy: Secondary | ICD-10-CM | POA: Diagnosis not present

## 2017-08-18 DIAGNOSIS — D72829 Elevated white blood cell count, unspecified: Secondary | ICD-10-CM | POA: Diagnosis present

## 2017-08-18 DIAGNOSIS — Z95828 Presence of other vascular implants and grafts: Secondary | ICD-10-CM | POA: Diagnosis not present

## 2017-08-18 DIAGNOSIS — R269 Unspecified abnormalities of gait and mobility: Secondary | ICD-10-CM | POA: Diagnosis not present

## 2017-08-18 DIAGNOSIS — Z7901 Long term (current) use of anticoagulants: Secondary | ICD-10-CM | POA: Diagnosis not present

## 2017-08-18 DIAGNOSIS — N183 Chronic kidney disease, stage 3 (moderate): Secondary | ICD-10-CM | POA: Diagnosis not present

## 2017-08-18 DIAGNOSIS — I663 Occlusion and stenosis of cerebellar arteries: Secondary | ICD-10-CM

## 2017-08-18 DIAGNOSIS — Z7982 Long term (current) use of aspirin: Secondary | ICD-10-CM

## 2017-08-18 DIAGNOSIS — D62 Acute posthemorrhagic anemia: Secondary | ICD-10-CM | POA: Diagnosis not present

## 2017-08-18 LAB — COMPREHENSIVE METABOLIC PANEL
ALK PHOS: 97 U/L (ref 38–126)
ALT: 16 U/L — ABNORMAL LOW (ref 17–63)
ANION GAP: 8 (ref 5–15)
AST: 22 U/L (ref 15–41)
Albumin: 3 g/dL — ABNORMAL LOW (ref 3.5–5.0)
BUN: 19 mg/dL (ref 6–20)
CALCIUM: 9 mg/dL (ref 8.9–10.3)
CHLORIDE: 105 mmol/L (ref 101–111)
CO2: 25 mmol/L (ref 22–32)
Creatinine, Ser: 1.47 mg/dL — ABNORMAL HIGH (ref 0.61–1.24)
GFR calc non Af Amer: 49 mL/min — ABNORMAL LOW (ref >60)
GFR, EST AFRICAN AMERICAN: 57 mL/min — AB (ref >60)
Glucose, Bld: 98 mg/dL (ref 65–99)
Potassium: 4.3 mmol/L (ref 3.5–5.1)
SODIUM: 138 mmol/L (ref 135–145)
Total Bilirubin: 0.8 mg/dL (ref 0.3–1.2)
Total Protein: 8.4 g/dL — ABNORMAL HIGH (ref 6.5–8.1)

## 2017-08-18 LAB — CBC
HCT: 36.5 % — ABNORMAL LOW (ref 39.0–52.0)
Hemoglobin: 11.8 g/dL — ABNORMAL LOW (ref 13.0–17.0)
MCH: 25.1 pg — AB (ref 26.0–34.0)
MCHC: 32.3 g/dL (ref 30.0–36.0)
MCV: 77.7 fL — ABNORMAL LOW (ref 78.0–100.0)
PLATELETS: 350 10*3/uL (ref 150–400)
RBC: 4.7 MIL/uL (ref 4.22–5.81)
RDW: 15.9 % — ABNORMAL HIGH (ref 11.5–15.5)
WBC: 11.7 10*3/uL — ABNORMAL HIGH (ref 4.0–10.5)

## 2017-08-18 LAB — PROTIME-INR
INR: 1.05
PROTHROMBIN TIME: 13.6 s (ref 11.4–15.2)

## 2017-08-18 LAB — MAGNESIUM: Magnesium: 1.8 mg/dL (ref 1.7–2.4)

## 2017-08-18 MED ORDER — WARFARIN SODIUM 5 MG PO TABS
5.0000 mg | ORAL_TABLET | Freq: Once | ORAL | Status: AC
  Administered 2017-08-18: 5 mg via ORAL
  Filled 2017-08-18: qty 1

## 2017-08-18 MED ORDER — BISACODYL 10 MG RE SUPP: 10.0000 mg | Freq: Every day | RECTAL | Status: DC | PRN

## 2017-08-18 MED ORDER — GUAIFENESIN-DM 100-10 MG/5ML PO SYRP: 5.0000 mL | ORAL_SOLUTION | Freq: Four times a day (QID) | ORAL | Status: DC | PRN

## 2017-08-18 MED ORDER — TRAZODONE HCL 50 MG PO TABS
25.0000 mg | ORAL_TABLET | Freq: Every evening | ORAL | Status: DC | PRN
  Administered 2017-08-19: 50 mg via ORAL
  Filled 2017-08-18 (×2): qty 1

## 2017-08-18 MED ORDER — POLYETHYLENE GLYCOL 3350 17 G PO PACK
17.0000 g | PACK | Freq: Every day | ORAL | Status: DC | PRN
  Administered 2017-08-28: 17 g via ORAL
  Filled 2017-08-18: qty 1

## 2017-08-18 MED ORDER — GABAPENTIN 100 MG PO CAPS
100.0000 mg | ORAL_CAPSULE | Freq: Every day | ORAL | Status: DC
  Administered 2017-08-18 – 2017-08-28 (×11): 100 mg via ORAL
  Filled 2017-08-18 (×11): qty 1

## 2017-08-18 MED ORDER — ACETAMINOPHEN 325 MG PO TABS
325.0000 mg | ORAL_TABLET | ORAL | Status: DC | PRN
  Administered 2017-08-19 – 2017-08-25 (×10): 650 mg via ORAL
  Administered 2017-08-27: 325 mg via ORAL
  Filled 2017-08-18 (×11): qty 2

## 2017-08-18 MED ORDER — WARFARIN SODIUM 5 MG PO TABS: 5.0000 mg | ORAL_TABLET | Freq: Once | ORAL | Status: DC

## 2017-08-18 MED ORDER — PROCHLORPERAZINE 25 MG RE SUPP: 12.5000 mg | Freq: Four times a day (QID) | RECTAL | Status: DC | PRN

## 2017-08-18 MED ORDER — ATORVASTATIN CALCIUM 80 MG PO TABS
80.0000 mg | ORAL_TABLET | Freq: Every day | ORAL | Status: DC
  Administered 2017-08-18 – 2017-08-28 (×11): 80 mg via ORAL
  Filled 2017-08-18 (×11): qty 1

## 2017-08-18 MED ORDER — ENOXAPARIN SODIUM 60 MG/0.6ML ~~LOC~~ SOLN
60.0000 mg | Freq: Two times a day (BID) | SUBCUTANEOUS | Status: DC
  Administered 2017-08-18 – 2017-08-25 (×14): 60 mg via SUBCUTANEOUS
  Filled 2017-08-18 (×14): qty 0.6

## 2017-08-18 MED ORDER — FLEET ENEMA 7-19 GM/118ML RE ENEM: 1.0000 | ENEMA | Freq: Once | RECTAL | Status: DC | PRN

## 2017-08-18 MED ORDER — LACTATED RINGERS IV SOLN
INTRAVENOUS | Status: DC
  Administered 2017-08-18 – 2017-08-19 (×3): via INTRAVENOUS

## 2017-08-18 MED ORDER — PANTOPRAZOLE SODIUM 40 MG PO TBEC
40.0000 mg | DELAYED_RELEASE_TABLET | Freq: Every day | ORAL | Status: DC
  Administered 2017-08-19 – 2017-08-29 (×11): 40 mg via ORAL
  Filled 2017-08-18 (×11): qty 1

## 2017-08-18 MED ORDER — ALUM & MAG HYDROXIDE-SIMETH 200-200-20 MG/5ML PO SUSP: 30.0000 mL | ORAL | Status: DC | PRN

## 2017-08-18 MED ORDER — WARFARIN - PHARMACIST DOSING INPATIENT
Freq: Every day | Status: DC
  Administered 2017-08-20 – 2017-08-26 (×6)

## 2017-08-18 MED ORDER — LACTATED RINGERS IV SOLN
INTRAVENOUS | Status: DC
  Administered 2017-08-18: 12:00:00 via INTRAVENOUS

## 2017-08-18 MED ORDER — TRAZODONE HCL 50 MG PO TABS: 25.0000 mg | ORAL_TABLET | Freq: Every evening | ORAL | Status: DC | PRN

## 2017-08-18 MED ORDER — ASPIRIN EC 81 MG PO TBEC
81.0000 mg | DELAYED_RELEASE_TABLET | Freq: Every day | ORAL | Status: DC
  Administered 2017-08-19 – 2017-08-29 (×11): 81 mg via ORAL
  Filled 2017-08-18 (×11): qty 1

## 2017-08-18 MED ORDER — PROCHLORPERAZINE MALEATE 5 MG PO TABS: 5.0000 mg | ORAL_TABLET | Freq: Four times a day (QID) | ORAL | Status: DC | PRN

## 2017-08-18 MED ORDER — PNEUMOCOCCAL VAC POLYVALENT 25 MCG/0.5ML IJ INJ
0.5000 mL | INJECTION | INTRAMUSCULAR | Status: AC
  Administered 2017-08-19: 0.5 mL via INTRAMUSCULAR
  Filled 2017-08-18: qty 0.5

## 2017-08-18 MED ORDER — PROCHLORPERAZINE EDISYLATE 10 MG/2ML IJ SOLN: 5.0000 mg | Freq: Four times a day (QID) | INTRAMUSCULAR | Status: DC | PRN

## 2017-08-18 MED ORDER — SENNOSIDES-DOCUSATE SODIUM 8.6-50 MG PO TABS
2.0000 | ORAL_TABLET | Freq: Every day | ORAL | Status: DC
  Administered 2017-08-19 – 2017-08-22 (×4): 2 via ORAL
  Filled 2017-08-18 (×4): qty 2

## 2017-08-18 MED ORDER — DIPHENHYDRAMINE HCL 12.5 MG/5ML PO ELIX: 12.5000 mg | ORAL_SOLUTION | Freq: Four times a day (QID) | ORAL | Status: DC | PRN

## 2017-08-18 NOTE — H&P (Signed)
Physical Medicine and Rehabilitation Admission H&P        Chief Complaint  Patient presents with  . 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  He completed his IV antibiotic close on 519 however was admitted on 5 /22/19 with 3-day history of bilateral lower extremity weakness with abdominal and bilateral groin pain.  CT abdomen pelvis done showed no evidence of leak and intense mural enhancement felt to be due to postop changes per Dr. Gwenlyn Saran.  MRI of brain was repeated and showed acute subcentimeter infarcts bilateral cerebellar and right cervical medullary junction as well as slow flow versus occluded right-VA.  Neurology was consulted for input and recommended DAPT with ECHO/TEE to rule out cardioembolic source. 2D echo done revealing EF 65-70% with moderate AVR.  CTA chest  5/24 revealing 4.5 cm thoracic aortic aneurysm with soft plaque and minimal ulcerative plaque and bilateral centrilobar/paraseptal emphysema. Dr. Erlinda Hong recommended lovenox bridge with coumadin and low dose ASA for extensive soft plaque likely cause of stroke. To repeat CTA aorta in 3 months to decided on further course of anticoagulation or antiplatelet.  Therapy evaluations done revealing functional deficits and CIR recommended for follow up therapy.     Review of Systems  Constitutional: Negative for chills and fever.  HENT: Negative for hearing loss and tinnitus.   Eyes: Negative for blurred vision and double vision.  Respiratory: Negative for cough and shortness of breath.   Cardiovascular: Negative for chest pain and palpitations.  Gastrointestinal:  Positive for abdominal pain (from umbilicus to groin). Negative for heartburn and nausea.  Genitourinary: Negative for dysuria and urgency.  Musculoskeletal: Negative for back pain, joint pain and myalgias.  Skin: Negative for itching and rash.  Neurological: Positive for dizziness (with head movements), sensory change (Left calf numb and burning under left foot.  Deceased sensation RLE and right face) and weakness. Negative for headaches.  Psychiatric/Behavioral: Negative for depression. The patient does not have insomnia.           Past Medical History:  Diagnosis Date  . Hypertension    . Stroke [facility])             Past Surgical History:  Procedure Laterality Date  . ENDOVASCULAR STENT INSERTION N/A 07/01/2017    Procedure: ENDOVASCULAR STENT GRAFT INSERTION;  Surgeon: Serafina Mitchell, MD;  Location: [facility] OR;  Service: Vascular;  Laterality: N/A;  . THROMBECTOMY FEMORAL ARTERY Bilateral 07/01/2017    Procedure: Bilateral IlioFemoral Thrombectomies;  Surgeon: Serafina Mitchell, MD;  Location: [facility] OR;  Service: Vascular;  Laterality: Bilateral;      No family history on file.      Social History:  Lives with cousin and his wife. Has a daughter in town.  Independent and was working in [place] at [employer] prior to surgery 06/2017.  He reports that he quit smoking about 4 weeks ago. His smoking use included cigarettes. He has never used smokeless tobacco. He reports that he does not drink alcohol or use drugs.      Allergies: No Known Allergies            Medications Prior to Admission  Medication Sig Dispense Refill  .  acetaminophen (TYLENOL) 500 MG tablet Take 500 mg by mouth every 6 (six) hours as needed (for pain or headaches).      Marland Kitchen aspirin EC 81 MG EC tablet Take 1 tablet (81 mg total) by mouth daily. 30 tablet 0  . docusate sodium (COLACE) 100 MG capsule Take 100 mg by mouth daily as needed for mild constipation.          Drug Regimen Review  Drug regimen was reviewed and  remains appropriate with no significant issues identified   Home: Home Living Family/patient expects to be discharged to:: Private residence Living Arrangements: Other relatives(cousin ) Available Help at Discharge: Family, Friend(s), Available PRN/intermittently Type of Home: House Home Access: Stairs to enter Technical brewer of Steps: 3 Entrance Stairs-Rails: None Home Layout: One level Home Equipment: Cane - single point Additional Comments: also has local dtr that checks on him, 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, sometimes other hours  Lives With: Family   Functional History: Prior Function Level of Independence: Independent Comments: works in [place] at [employer]:  Mobility: Bed Mobility Overal bed mobility: Needs Assistance Bed Mobility: Supine to Sit Supine to sit: Supervision General bed mobility comments: for safety and line management Transfers Overall transfer level: Needs assistance Equipment used: Rolling walker (2 wheeled) Transfers: Sit to/from Stand Sit to Stand: Min assist General transfer comment: min assist for anterior-superior wt shift and to stabilize once in standing with RW support Ambulation/Gait Ambulation/Gait assistance: Min assist, Mod assist Ambulation Distance (Feet): 12 Feet(in room) Assistive device: Rolling walker (2 wheeled) Gait Pattern/deviations: Step-through pattern, Decreased stride length, Narrow base of support General Gait Details: assist for balance and to maneuver RW in room, multi-modal cues for step length, RW position, heavy use of UEs   ADL:   Cognition: Cognition Overall Cognitive Status: Impaired/Different from baseline Arousal/Alertness: Awake/alert Orientation Level: Oriented X4 Attention: Alternating Alternating Attention: Impaired Alternating Attention Impairment: Verbal basic, Functional basic Memory: Impaired Memory Impairment: Decreased recall of new information Awareness:  Impaired Awareness Impairment: Anticipatory impairment Problem Solving: Impaired Problem Solving Impairment: Verbal complex, Functional complex Safety/Judgment: Appears intact Cognition Arousal/Alertness: Awake/alert Behavior During Therapy: WFL for tasks assessed/performed Overall Cognitive Status: Impaired/Different from baseline     Blood pressure (!) 149/82, pulse 82, temperature 97.9 F (36.6 C), temperature source Oral, resp. rate 18, height _0  (1.702 m), weight 56.7 kg (125 lb), SpO2 97 %. Physical Exam  Nursing note and vitals reviewed. GI:  Midline incision well healed  Skin:  Mildline abdominal and right groin incision C/D/I. Left groin incision with scabbed? areas.       General: No acute distress Mood and affect are appropriate Heart: Regular rate and rhythm no rubs murmurs or extra sounds Lungs: Clear to auscultation, breathing unlabored, no rales or wheezes Abdomen: Positive bowel sounds, soft nontender to palpation, nondistended Extremities: No clubbing, cyanosis, or edema Skin: No evidence of breakdown, no evidence of rash Neurologic: Cranial nerves II through XII intact,Strength is 5/5 bilateral deltoid bicep tricep grip 3- bilateral hip flexor 4 at bilateral knee extensor 4 bilateral ankle dorsiflexor No evidence of dysmetria bilateral finger-nose-finger testing    Sensory exam normal sensation to light touch  in bilateral upper and lower extremities Cerebellar exam normal finger to nose to finger as well as heel to shin in bilateral upper and lower extremities Musculoskeletal: Full range of motion in all 4 extremities. No joint swelling Lab Results Last 48 Hours  Results for orders placed or performed during the hospital encounter of 08/15/17 (from the past 48 hour(s))  Urinalysis, Routine w reflex microscopic     Status: Abnormal    Collection Time: 08/15/17  3:26 PM  Result Value Ref Range    Color, Urine YELLOW YELLOW    APPearance CLEAR CLEAR     Specific Gravity, Urine 1.020 1.005 - 1.030    pH 5.0 5.0 - 8.0    Glucose, UA NEGATIVE NEGATIVE mg/dL    Hgb urine dipstick SMALL (A) NEGATIVE    Bilirubin Urine NEGATIVE NEGATIVE    Ketones, ur NEGATIVE NEGATIVE mg/dL    Protein, ur 30 (A) NEGATIVE mg/dL    Nitrite NEGATIVE NEGATIVE    Leukocytes, UA NEGATIVE NEGATIVE    RBC / HPF 0-5 0 - 5 RBC/hpf    WBC, UA 0-5 0 - 5 WBC/hpf    Bacteria, UA RARE (A) NONE SEEN    Squamous Epithelial / LPF 0-5 0 - 5    Mucus PRESENT        Comment: Performed at [facility], [address] [place], [place] [address]  Culture, blood (routine x 2)     Status: None (Preliminary result)    Collection Time: 08/15/17  4:40 PM  Result Value Ref Range    Specimen Description BLOOD RIGHT ARM      Special Requests          BOTTLES DRAWN AEROBIC AND ANAEROBIC Blood Culture results may not be optimal due to an inadequate volume of blood received in culture bottles    Culture          NO GROWTH 2 DAYS Performed at [facility] [address], [place], [place] [address]      Report Status PENDING    Culture, blood (routine x 2)     Status: None (Preliminary result)    Collection Time: 08/15/17  4:50 PM  Result Value Ref Range    Specimen Description BLOOD RIGHT FOREARM      Special Requests          BOTTLES DRAWN AEROBIC AND ANAEROBIC Blood Culture adequate volume    Culture          NO GROWTH 2 DAYS Performed at [facility], [place] [address], [place],  [address]      Report Status PENDING    I-Stat CG4 Lactic Acid, ED     Status: None    Collection Time: 08/15/17  5:00 PM  Result Value Ref Range    Lactic Acid, Venous 0.68 0.5 - 1.9 mmol/L  Basic metabolic panel     Status: Abnormal    Collection Time: 08/16/17  6:25 AM  Result Value Ref Range    Sodium 138 135 - 145 mmol/L    Potassium 4.0 3.5 - 5.1 mmol/L    Chloride 106 101 - 111 mmol/L    CO2 25 22 - 32 mmol/L    Glucose, Bld 86 65 - 99 mg/dL    BUN 15 6 - 20  mg/dL    Creatinine, Ser 1.27 (H) 0.61 - 1.24 mg/dL    Calcium 9.0 8.9 - 10.3 mg/dL    GFR calc non Af Amer 58 (L) >60 mL/min    GFR calc Af Amer >60 >60 mL/min      Comment: (NOTE) The eGFR has been calculated using the CKD EPI equation. This calculation has not been validated in all clinical situations. eGFR's persistently <  60 mL/min signify possible Chronic Kidney Disease.      Anion gap 7 5 - 15      Comment: Performed at [facility] [address] [place], [place] [address]  CBC     Status: Abnormal    Collection Time: 08/16/17  6:25 AM  Result Value Ref Range    WBC 11.9 (H) 4.0 - 10.5 K/uL    RBC 4.85 4.22 - 5.81 MIL/uL    Hemoglobin 12.2 (L) 13.0 - 17.0 g/dL    HCT 37.9 (L) 39.0 - 52.0 %    MCV 78.1 78.0 - 100.0 fL    MCH 25.2 (L) 26.0 - 34.0 pg    MCHC 32.2 30.0 - 36.0 g/dL    RDW 15.9 (H) 11.5 - 15.5 %    Platelets 356 150 - 400 K/uL      Comment: Performed at [facility], [place] [address], [place], [place] [address]  Lipid panel     Status: Abnormal    Collection Time: 08/16/17  6:25 AM  Result Value Ref Range    Cholesterol 174 0 - 200 mg/dL    Triglycerides 94 <150 mg/dL    HDL 40 (L) >40 mg/dL    Total CHOL/HDL Ratio 4.4 RATIO    VLDL 19 0 - 40 mg/dL    LDL Cholesterol 115 (H) 0 - 99 mg/dL      Comment:        Total Cholesterol/HDL:CHD Risk Coronary Heart Disease Risk Table                     Men   Women  1/2 Average Risk   3.4   3.3  Average Risk       5.0   4.4  2 X Average Risk   9.6   7.1  3 X Average Risk  23.4   11.0        Use the calculated Patient Ratio above and the CHD Risk Table to determine the patient's CHD Risk.        ATP III CLASSIFICATION (LDL):  <100     mg/dL   Optimal  100-129  mg/dL   Near or Above                    Optimal  130-159  mg/dL   Borderline  160-189  mg/dL   High  >190     mg/dL   Very High Performed at [facility] [address], [place], [place] [address]    Urinalysis, Routine w reflex  microscopic     Status: None    Collection Time: 08/16/17  3:16 PM  Result Value Ref Range    Color, Urine YELLOW YELLOW    APPearance CLEAR CLEAR    Specific Gravity, Urine 1.018 1.005 - 1.030    pH 5.0 5.0 - 8.0    Glucose, UA NEGATIVE NEGATIVE mg/dL    Hgb urine dipstick NEGATIVE NEGATIVE    Bilirubin Urine NEGATIVE NEGATIVE    Ketones, ur NEGATIVE NEGATIVE mg/dL    Protein, ur NEGATIVE NEGATIVE mg/dL    Nitrite NEGATIVE NEGATIVE    Leukocytes, UA NEGATIVE NEGATIVE      Comment: Performed at [facility] [address] [place], [place] [address]  CBC     Status: Abnormal    Collection Time: 08/17/17  4:40 AM  Result Value Ref Range    WBC 11.7 (H) 4.0 -  10.5 K/uL    RBC 4.63 4.22 - 5.81 MIL/uL    Hemoglobin 11.6 (L) 13.0 - 17.0 g/dL    HCT 36.3 (L) 39.0 - 52.0 %    MCV 78.4 78.0 - 100.0 fL    MCH 25.1 (L) 26.0 - 34.0 pg    MCHC 32.0 30.0 - 36.0 g/dL    RDW 15.9 (H) 11.5 - 15.5 %    Platelets 332 150 - 400 K/uL      Comment: Performed at [facility] [address] [place], [place] [address]  Basic metabolic panel     Status: Abnormal    Collection Time: 08/17/17  4:40 AM  Result Value Ref Range    Sodium 139 135 - 145 mmol/L    Potassium 4.4 3.5 - 5.1 mmol/L    Chloride 105 101 - 111 mmol/L    CO2 26 22 - 32 mmol/L    Glucose, Bld 90 65 - 99 mg/dL    BUN 16 6 - 20 mg/dL    Creatinine, Ser 1.39 (H) 0.61 - 1.24 mg/dL    Calcium 9.0 8.9 - 10.3 mg/dL    GFR calc non Af Amer 52 (L) >60 mL/min    GFR calc Af Amer >60 >60 mL/min      Comment: (NOTE) The eGFR has been calculated using the CKD EPI equation. This calculation has not been validated in all clinical situations. eGFR's persistently <60 mL/min signify possible Chronic Kidney Disease.      Anion gap 8 5 - 15      Comment: Performed at [facility] [address], [place], [place] [address]  Magnesium     Status: None    Collection Time: 08/17/17  4:40 AM  Result Value Ref Range     Magnesium 1.8 1.7 - 2.4 mg/dL      Comment: Performed at [facility] [address] [place], [place] [address]       Imaging Results (Last 48 hours)  Ct Angio Head W Or Wo Contrast   Result Date: 08/16/2017 CLINICAL DATA:  Stroke follow-up EXAM: CT ANGIOGRAPHY HEAD AND NECK TECHNIQUE: Multidetector CT imaging of the head and neck was performed using the standard protocol during bolus administration of intravenous contrast. Multiplanar CT image reconstructions and MIPs were obtained to evaluate the vascular anatomy. Carotid stenosis measurements (when applicable) are obtained utilizing NASCET criteria, using the distal internal carotid diameter as the denominator. CONTRAST:  40m ISOVUE-370 IOPAMIDOL (ISOVUE-370) INJECTION 76% COMPARISON:  Head CT 08/15/2017 FINDINGS: CTA NECK FINDINGS AORTIC ARCH: There is moderate low density atherosclerosis of the aortic arch. On the most inferior image of the study, there is a small central filling defect within the aortic arch. This is unchanged compared to 07/03/2017. There is no aneurysm, dissection or hemodynamically significant stenosis of the visualized ascending aorta and aortic arch. Conventional 3 vessel aortic branching pattern. Moderate stenosis of the origin of the left subclavian artery which is otherwise normal. RIGHT CAROTID SYSTEM: --Common carotid artery: Widely patent origin without common carotid artery dissection or aneurysm. --Internal carotid artery: No dissection, occlusion or aneurysm. No hemodynamically significant stenosis. --External carotid artery: No acute abnormality. LEFT CAROTID SYSTEM: --Common carotid artery: Widely patent origin without common carotid artery dissection or aneurysm. --Internal carotid artery:No dissection, occlusion or aneurysm. No hemodynamically significant stenosis. --External carotid artery: No acute abnormality. VERTEBRAL ARTERIES: Left dominant configuration. Severe narrowing of the origin of the right  vertebral artery, unchanged. The right  vertebral artery remains diminutive along its entire course and terminates in the right PICA. The left vertebral artery origin is normal. The left vertebral artery is normal to the skull base. SKELETON: There is no bony spinal canal stenosis. No lytic or blastic lesion. OTHER NECK: Likely paralysis of the right vocal cord. UPPER CHEST: Biapical emphysema. CTA HEAD FINDINGS ANTERIOR CIRCULATION: --Intracranial internal carotid arteries: Atherosclerotic calcification of the internal carotid arteries at the skull base without hemodynamically significant stenosis. --Anterior cerebral arteries: Normal. Both A1 segments are present. Patent anterior communicating artery. --Middle cerebral arteries: Chronic occlusion of the right M1 segment. --Posterior communicating arteries: Absent bilaterally. POSTERIOR CIRCULATION: --Basilar artery: Normal. --Posterior cerebral arteries: Normal. --Superior cerebellar arteries: Normal. --Inferior cerebellar arteries: Normal anterior and posterior inferior cerebellar arteries. VENOUS SINUSES: As permitted by contrast timing, patent. ANATOMIC VARIANTS: None DELAYED PHASE: No parenchymal contrast enhancement. Review of the MIP images confirms the above findings. IMPRESSION: 1. No new emergent large vessel occlusion. 2. 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 3. Unchanged severe stenosis or occlusion of the origin of the right vertebral artery, which is otherwise patent to the skull base. 4. No hemodynamically significant stenosis of the carotid arteries. 5. Bulky noncalcified plaque in the aortic arch with moderate stenosis of the proximal left subclavian artery. 6. Dilated right laryngeal ventricle and piriform sinus suggesting right-sided vocal cord paralysis. Aortic Atherosclerosis (ICD10-I70.0) and Emphysema (ICD10-J43.9). Electronically Signed   By: Ulyses Jarred M.D.   On: 08/16/2017 21:26    Dg Chest 2 View   Result  Date: 08/15/2017 CLINICAL DATA:  Patient admitted for stroke.  Weakness. EXAM: CHEST - 2 VIEW COMPARISON:  07/04/2017 FINDINGS: The heart size and mediastinal contours are within normal limits. Both lungs are clear. Subpleural scarring is seen along the periphery of the left lung base. Right-sided PICC line catheter terminates in the distal SVC. The visualized skeletal structures are unremarkable. IMPRESSION: No active cardiopulmonary disease. Electronically Signed   By: Ashley Royalty M.D.   On: 08/15/2017 14:18    Ct Head Wo Contrast   Result Date: 08/15/2017 CLINICAL DATA:  Recent stroke in April 2019. Difficulty walking without falling. Headache and lower extremity pain. EXAM: CT HEAD WITHOUT CONTRAST TECHNIQUE: Contiguous axial images were obtained from the base of the skull through the vertex without intravenous contrast. COMPARISON:  None. FINDINGS: Brain: No mass lesion, intraparenchymal hemorrhage or extra-axial collection. No evidence of acute cortical infarct. Bilateral old basal ganglia lacunar infarcts. There is periventricular hypoattenuation compatible with chronic microvascular disease. Vascular: Atherosclerotic calcification of the internal carotid arteries at the skull base. No hyperdense vessel. Skull: Normal visualized skull base, calvarium and extracranial soft tissues. Sinuses/Orbits: No fluid levels or advanced mucosal thickening of the visualized paranasal sinuses. No mastoid or middle ear effusion. The orbits are normal. IMPRESSION: Old bilateral basal ganglia lacunar infarcts and sequelae of chronic small vessel disease without acute intracranial abnormality. Electronically Signed   By: Ulyses Jarred M.D.   On: 08/15/2017 14:29    Ct Angio Neck W Or Wo Contrast   Result Date: 08/16/2017 CLINICAL DATA:  Stroke follow-up EXAM: CT ANGIOGRAPHY HEAD AND NECK TECHNIQUE: Multidetector CT imaging of the head and neck was performed using the standard protocol during bolus administration of  intravenous contrast. Multiplanar CT image reconstructions and MIPs were obtained to evaluate the vascular anatomy. Carotid stenosis measurements (when applicable) are obtained utilizing NASCET criteria, using the distal internal carotid diameter as the denominator. CONTRAST:  50m ISOVUE-370 IOPAMIDOL (  ISOVUE-370) INJECTION 76% COMPARISON:  Head CT 08/15/2017 FINDINGS: CTA NECK FINDINGS AORTIC ARCH: There is moderate low density atherosclerosis of the aortic arch. On the most inferior image of the study, there is a small central filling defect within the aortic arch. This is unchanged compared to 07/03/2017. There is no aneurysm, dissection or hemodynamically significant stenosis of the visualized ascending aorta and aortic arch. Conventional 3 vessel aortic branching pattern. Moderate stenosis of the origin of the left subclavian artery which is otherwise normal. RIGHT CAROTID SYSTEM: --Common carotid artery: Widely patent origin without common carotid artery dissection or aneurysm. --Internal carotid artery: No dissection, occlusion or aneurysm. No hemodynamically significant stenosis. --External carotid artery: No acute abnormality. LEFT CAROTID SYSTEM: --Common carotid artery: Widely patent origin without common carotid artery dissection or aneurysm. --Internal carotid artery:No dissection, occlusion or aneurysm. No hemodynamically significant stenosis. --External carotid artery: No acute abnormality. VERTEBRAL ARTERIES: Left dominant configuration. Severe narrowing of the origin of the right vertebral artery, unchanged. The right vertebral artery remains diminutive along its entire course and terminates in the right PICA. The left vertebral artery origin is normal. The left vertebral artery is normal to the skull base. SKELETON: There is no bony spinal canal stenosis. No lytic or blastic lesion. OTHER NECK: Likely paralysis of the right vocal cord. UPPER CHEST: Biapical emphysema. CTA HEAD FINDINGS ANTERIOR  CIRCULATION: --Intracranial internal carotid arteries: Atherosclerotic calcification of the internal carotid arteries at the skull base without hemodynamically significant stenosis. --Anterior cerebral arteries: Normal. Both A1 segments are present. Patent anterior communicating artery. --Middle cerebral arteries: Chronic occlusion of the right M1 segment. --Posterior communicating arteries: Absent bilaterally. POSTERIOR CIRCULATION: --Basilar artery: Normal. --Posterior cerebral arteries: Normal. --Superior cerebellar arteries: Normal. --Inferior cerebellar arteries: Normal anterior and posterior inferior cerebellar arteries. VENOUS SINUSES: As permitted by contrast timing, patent. ANATOMIC VARIANTS: None DELAYED PHASE: No parenchymal contrast enhancement. Review of the MIP images confirms the above findings. IMPRESSION: 1. No new emergent large vessel occlusion. 2. 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 3. Unchanged severe stenosis or occlusion of the origin of the right vertebral artery, which is otherwise patent to the skull base. 4. No hemodynamically significant stenosis of the carotid arteries. 5. Bulky noncalcified plaque in the aortic arch with moderate stenosis of the proximal left subclavian artery. 6. Dilated right laryngeal ventricle and piriform sinus suggesting right-sided vocal cord paralysis. Aortic Atherosclerosis (ICD10-I70.0) and Emphysema (ICD10-J43.9). Electronically Signed   By: Ulyses Jarred M.D.   On: 08/16/2017 21:26    Mr Brain Wo Contrast   Result Date: 08/16/2017 CLINICAL DATA:  Acute onset inability to walk 3 days ago. Recent endovascular repair of ruptured aortic aneurysm, bilateral iliofemoral endarterectomies. History of hypertension. EXAM: MRI HEAD WITHOUT CONTRAST TECHNIQUE: Multiplanar, multiecho pulse sequences of the brain and surrounding structures were obtained without intravenous contrast. COMPARISON:  CT HEAD Aug 15, 2017 and MRI of the head  June 28, 2017 FINDINGS: INTRACRANIAL CONTENTS: 3 new subcentimeter foci of reduced diffusion within RIGHT cerebellum, LEFT cerebellum and RIGHT cervicomedullary junction, identifiable lesions demonstrate low ADC values.No susceptibility artifact to suggest acute blood products. Old bilateral basal ganglia infarcts with ex vacuo dilatation subjacent lateral ventricle. Old bilateral thalami small infarcts. Old small cerebellar infarcts. Borderline parenchymal brain volume loss for age. No hydrocephalus. No midline shift, mass effect or masses. VASCULAR: T2 bright signal RIGHT vertebral artery. SKULL AND UPPER CERVICAL SPINE: No abnormal sellar expansion. No suspicious calvarial bone marrow signal. Craniocervical junction maintained. SINUSES/ORBITS: Trace paranasal  sinus mucosal thickening. Mastoid air cells are well aerated.The included ocular globes and orbital contents are non-suspicious. OTHER: None. IMPRESSION: 1. Acute subcentimeter infarcts bilateral cerebella and RIGHT cervicomedullary junction. 2. Slow flow versus occluded RIGHT vertebral artery. 3. 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 Old small cerebellar infarcts. Electronically Signed   By: Elon Alas M.D.   On: 08/16/2017 00:32    Ct Angio Abd/pel W And/or Wo Contrast   Result Date: 08/15/2017 CLINICAL DATA:  62 year old male with a history of ruptured abdominal aortic aneurysm, endovascular repair performed 07/01/2017 EXAM: CTA ABDOMEN AND PELVIS wITHOUT AND WITH CONTRAST TECHNIQUE: Multidetector CT imaging of the abdomen and pelvis was performed using the standard protocol during bolus administration of intravenous contrast. Multiplanar reconstructed images and MIPs were obtained and reviewed to evaluate the vascular anatomy. CONTRAST:  100 cc  ISOVUE-370 IOPAMIDOL (ISOVUE-370) INJECTION 76% COMPARISON:  07/08/2017, 07/03/2017, 07/01/2017 FINDINGS: VASCULAR Aorta: Re- demonstration of endovascular repair of ruptured  infrarenal abdominal aortic aneurysm, main device from the right and the contralateral gate from the left. Infrarenal fixation, with the graft margin just below the lowest, left, renal artery. No evidence of a type 1 or a type 2 endoleak. Intense differential enhancement of the wall of the excluded aneurysm on the delayed images. Significantly decreased size of the retroperitoneal hematoma adjacent to the psoas muscle. Greatest diameter today measures 3.0 cm x 3.4 cm on the axial images. Fluid extends inferiorly in a cone like configuration lateral to the left psoas muscle overlying the iliacus. Near complete resolution of hematoma surrounding the left iliac vasculature. The right limb terminates within the common iliac artery. The left limb terminates within the left common iliac artery. Greatest diameter of the excluded aneurysm sac measures 7.2 cm (image 23 of series 15), which is larger than the comparison CT studies. Interval resolution of gas within the aneurysm sac. Celiac: Celiac arteries patent. No significant atherosclerotic changes at the origin. Traditional branch pattern of the celiac artery. Accessory left hepatic artery. SMA: Superior mesenteric artery is patent with no significant atherosclerotic changes at the origin. Renals: Main left and right renal arteries are patent. Again, irregularity at the proximal right renal artery may represent a fusiform aneurysm, with associated stenosis. Small accessory left renal artery above the main renal artery. Main renal artery appears patent, just above the proximal stent graft. IMA: IMA appears occluded at the origin. Left colic artery and superior rectal artery are patent via collateral flow. Right lower extremity: The right iliac limb is patent, with near complete interval resolution of the mural thrombus/thickening, with maintained flow channel. No evidence of a type 1 B endoleak. The hypogastric artery appears occluded in the interval. External iliac  artery is patent. Surgical changes at the right common femoral artery. Proximal profunda femoris and superficial femoral artery patent. Left lower extremity: Left iliac limb is patent. No evidence of a type 1 B endoleak. Left hypogastric artery and external iliac artery are patent. Proximal SFA and profunda femoris patent. Veins: Unremarkable appearance of the venous system. Review of the MIP images confirms the above findings. NON-VASCULAR Lower chest: Evidence of mild interlobular septal thickening. Linear opacities at the lung bases. Calcified granuloma of the right middle lobe. Hepatobiliary: Unremarkable appearance of the liver. Hyperdense material within the gallbladder lumen compatible with cholelithiasis. No evidence of associated inflammatory changes. Pancreas: Unremarkable pancreas Spleen: Unremarkable spleen Adrenals/Urinary Tract: Enhancing lesion of the left adrenal gland. Unremarkable appearance of the right adrenal gland. Right: No right-sided hydronephrosis.  Similar appearance of cystic lesion at the superior right kidney. No nephrolithiasis. Unremarkable course the right ureter. Left: No left-sided hydronephrosis. 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 Unremarkable course the left ureter. Urinary bladder partially distended. Stomach/Bowel: Unremarkable stomach . Unremarkable small bowel. No abnormal distention. Moderate stool burden. No transition point. No inflammatory changes. Normal appendix. Colonic diverticular present without associated inflammation. No inflammatory changes within the small bowel mesenteric. Lymphatic: No lymphadenopathy. Reproductive: Unremarkable appearance of the pelvic organs. Other: Small fat containing umbilical hernia. Musculoskeletal: No acute displaced fracture. Mild degenerative changes of the spine. IMPRESSION: Re-demonstration of endovascular repair of ruptured infrarenal abdominal aortic aneurysm. No evidence of endoleak or stent graft migration. The  aneurysm sac demonstrates no internal enhancement or continued flow, however, there is intense mural enhancement on the delayed images, and the greatest diameter of the aneurysm sac has enlarged to a greatest diameter of 7.2 cm, previously 6.6 cm on presentation. The etiology of the enlargement is uncertain, potentially reactive or post inflammatory. Although gas has resolved in the interval, seen on the prior and most likely postoperative changes, infection cannot be excluded. There has been near complete resolution of the right limb mural thrombus, with patency maintained of the bilateral iliac limbs. Interval occlusion of the right hypogastric artery. Left hypogastric artery remains patent. Interval improvement of retroperitoneal hematoma, with near complete resolution adjacent to the iliac vasculature, and small persisting fluid collection lateral to the left psoas muscle. These results were called by telephone at the time of interpretation on 08/15/2017 at 4:09 pm to Dr. Jeannie Done PFEIFFER. 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 Correlation with duplex may be useful. Cholelithiasis. Evidence of mild pulmonary edema. Signed, Dulcy Fanny. Earleen Newport, DO [facility] Electronically Signed   By: Corrie Mckusick D.O.   On: 08/15/2017 16:09             Medical Problem List and Plan: 1.  Paraparesis lower extremity sensory changes and gait disorder likely had spinal cord ischemia related to AAA rupture and Aortic arch plaque and uceration, also has had a small cerebellar infarcts Given paraparesis is main neuro deficit not explained by CVA location will get Lumbar +/- Thoracic MRI to look for spinal cord ischemic changes  2.  DVT Prophylaxis/Anticoagulation: Pharmaceutical: Coumadin and Lovenox with low dose ASA 3. Pain Management: tylenol prn. Will add low dose gabapentin to  help with dysesthesias.  4. Mood: LCSW to follow for evaluation and support.  5. Neuropsych: This patient is capable of making decisions on his own behalf. 6. Skin/Wound Care: Monitor wound for healing. Maintain adequate nutritional and hydration  Status.  7. Fluids/Electrolytes/Nutrition: Monitor I/O-- check lytes in am.  8. HTN: Monitor BP bid 9. Ruptured aneurysm s/p repair with subsequent  Endarterectomy: Monitor BP bid. Controlled at this time 10. CKD: Monitor for stability. Avoid nephrotoxic medication. Baseline SCr 1.3 11. Dyslipidemia: on Lipitor 12. Leucocytosis: Monitor for signs of infection. Completed antibiotic regimen on 08/12/17     Post Admission Physician Evaluation: Functional deficits secondary  to Paraparesis lower extremity sensory changes and gait disorder likely had spinal cord ischemia related to AAA rupture, also has had a small cerebellar infarcts 1. . 2. Patient admitted to receive collaborative, interdisciplinary care between the physiatrist, rehab nursing staff, and therapy team. 3. Patient's level of medical complexity and substantial therapy needs in context of that medical necessity cannot be provided at a lesser intensity  of care. 4. Patient has experienced substantial functional loss from his/her baseline. Upon functional assessment at the time of the preadmission screening, patient was Min/Mod A.  Upon most recent functional evaluation, patient was Min/Mod A.  Judging by the patient's diagnosis, physical exam, and functional history, the patient has potential for functional progress which will result in measurable gains while on inpatient rehab.  These gains will be of substantial and practical use upon discharge in facilitating mobility and self-care at the household level. 5. Physiatrist will provide 24 hour management of medical needs as well as oversight of the therapy plan/treatment and provide guidance as appropriate regarding the interaction of the  two. 6. 24 hour rehab nursing will assist in the management of  bladder management, bowel management, safety, skin/wound care, disease management, medication administration, pain management and patient education  and help integrate therapy concepts, techniques,education, etc. 7. PT will assess and treat for:  Marland Kitchen  Goals are: independent with assistive device. 8. OT will assess and treat for  .  Goals are: independent with increased time.  9. SLP will assess and treat for  .  Goals are: N/A. 10. Case Management and Social Worker will assess and treat for psychological issues and discharge planning. 11. Team conference will be held weekly to assess progress toward goals and to determine barriers to discharge. 12.  Patient will receive at least 3 hours of therapy per day at least 5 days per week. 13. ELOS and Prognosis:  excellent   "I have personally performed a face to face diagnostic evaluation of this patient.  Additionally, I have reviewed and concur with the physician assistant's documentation above."  Charlett Blake M.D. [facility] FAAPM&R (Sports Med, Neuromuscular Med) Diplomate Am Board of [place], PA-C

## 2017-08-18 NOTE — Discharge Summary (Signed)
Physician Discharge Summary  Thomas [name] KDT:[id number] DOB: [birth year] DOA: 08/15/2017  PCP: Patient, No Pcp Per  Admit date: 08/15/2017 Discharge date: 08/18/2017  Time spent: 35 minutes  Recommendations for Rehab and Outpatient Follow-up:  1. Follow up CBC/CMP (attention to creatinine, had several contrast loads over past few days) 2. While in rehab continue lovenox bridge with warfarin.  Continue pharmacy consults for these.  Goal INR is 2-3.  At time of discharge, would discuss warfarin regimen with pharmacy.  Will need to follow up with PCP for INR checks, if INR not therapeutic at time of discharge, will need continued bridge with lovenox (lovenox can be discontinued when INR 2-3).  3. Follow up with neurology as outpatient 4. Continue aspirin and atorvastatin 5. Follow blood pressure, consider adding amlodipine if BP's consistently greater than 140, some fluctuation here 6. Thoracic aortic aneurysm - will need semi annual imaging and CT surgery referral at discharge (see imaging report) 7. L adrenal nodule - recommended follow up imaging, follow outpatient (see report) 8. R renal artery irregularity, favored fusiform aneuysm with stenosis (see imaging), consider doppler to follow as outpatient 9. Follow up with vascular surgery as an outpatient.  Blood cultures no growth to date.  Mural enhancement on imaging thought to be reactive, post inflammatory changes.  Follow final blood cultures. 10. R sided vocal cord paralysis, follow outpatient, follow speech recs from CIR  Discharge Diagnoses:  Principal Problem:   Gait instability Active Problems:   Hypertension   Pelvic pain   Chronic kidney disease   Abnormal CT scan, pelvis   Cerebral embolism with cerebral infarction   Paraparesis [facility])   Discharge Condition: stable  Diet recommendation: heart healthy  Filed Weights   08/15/17 1003 08/15/17 1018  Weight: 56.7 kg (125 lb) 56.7 kg (125 lb)    History of present illness:   62 yo M with hx of CKD stage III, bacteremia 2/2 strep pneumo, CVA, ruptured AAA s/p bilateral iliofemoral endarterectomy and thromboembolectomy on 4/7 representing with with difficulty with ambulation, pelvic pain, and headache over the past few days.   He was found to have new strokes.  He was seen by stroke team and had workup notable for CTA chest with soft plaque in thoracic aortic aneurysm.  Started on warfarin with lovenox bridge.  PT/OT recommended CIR.  Patient being transferred to CIR at discharge with plan to continue warfarin with lovenox bridge.  CTA abdomen with inflammatory changes around AAA repair thought to be inflammatory changes.     Hospital Course:   #1.  Acute Stroke  Gait Instability  RLE Weakness:  Suspect gait instability related to acute infarcts.  With acute subcentimeter infarcts bilateral cerebella and R cervicomedullary junction.  Slow flow vs occluded R vertebral artery.  Also old bilateral basal ganglia and thalami lancunar infarcts.  Some chronicity to the RLE weakness, noted at prior hospitalization.  Also R sided facial numbness.   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  Also dilated R laryngeal ventricle and piriform sinus suggesting R sided vocal cord paralysis. - CTA chest with 4.5 ascending thoracic aortic aneurysm with soft plaque -> given this finding, planning for lovenox bridge with coumadin.  INR goal 2-3.  Continue aspirin and  atorvastatin.  Follow up with neurology as outpatient.  - PT/OT/SLP -> recommending CIR - hopefully transfer tomorrow - A1c 5.6 at last hospitalization, lipids notable for LDL 72 and HDL 37 - increase atorva to 80  #2. Abnormal CTA of the pelvis. Patient 6 weeks  status post emergent endovascular repair of ruptured aortic abdominal aneurysm and bilateral lower extremity thrombectomies with pelvic pain and gait instability. CTA done to evaluate endograft.CTA reveals a patent endograft but does reveal likely reactive postoperative inflammatory changes to aneurysm sac. No obvious sign of infection. Evaluated by vascular surgery who opined no indication for surgical intervention but recommended admitting for observation for infection. -Obtain blood cultures (ngtd) -Monitor vital signs - follow up with vascular as outpatient  # Headache: resolved   #3. Chronic kidney disease stage III. Slightly elevated today from yesterday, but still within baseline.  Follow in CIR with recent contrast.  -Follow as outpatinet  #4. Hypertension.  -Monitor.  Generally, his BP's have been appropriate in 120's and 130's. - Continue to follow and consider starting amlodipine if consistently elevated  # Thoracic Aortic Aneurysm: outpatient follow up.  Will need CT surgery referral.    # L adrenal Nodule: follow up outpaient  Procedures: Echo Study Conclusions  - Left ventricle: The cavity size was normal. There was moderate   concentric hypertrophy. Systolic function was vigorous. The   estimated ejection fraction was in the range of 65% to 70%. Wall   motion was normal; there were no regional wall motion   abnormalities. The study is not technically sufficient to allow   evaluation of LV diastolic function. - Aortic valve: There was mild to moderate regurgitation. - Atrial septum: There was increased thickness of the septum,   consistent with lipomatous hypertrophy. - Tricuspid valve: There was trivial regurgitation.   Consultations:  neurlogy  vascular  Discharge Exam: Vitals:   08/18/17 0004 08/18/17 0408  BP: (!) 143/85 133/72  Pulse: 69 67  Resp: 17 18  Temp: 98.1 F (36.7 C) 97.9 F (36.6 C)  SpO2: 99% 98%   Feels ok this morning. Some  dizziness, which is chronic.  General: No acute distress. Cardiovascular: Heart sounds show a regular rate, and rhythm. No gallops or rubs. No murmurs. No JVD. Lungs: Clear to auscultation bilaterally with good air movement. No rales, rhonchi or wheezes. Abdomen: Soft, nontender, nondistended with normal active bowel sounds. No masses. No hepatosplenomegaly. Neurological: This morning on my exam.  Alert and oriented 3. 4/5 strength to RLE.  Decreased sensation to R face. (per last neurology note, noted r facial decreased sensation, chronic L hearing loss, mild hoarseness,  Decreased sensation to LLE and R LE HTS ataxic). Cranial nerves II through XII grossly intact. Skin: Warm and dry. No rashes or lesions. Extremities: No clubbing or cyanosis. No edema.  Psychiatric: Mood and affect are normal. Insight and judgment are appropriate.  Discharge Instructions   Discharge Instructions    Ambulatory referral to Neurology   Complete by:  As directed    Follow up with stroke clinic NP (Jessica Vanschaick or Cecille Rubin, if both not available, consider Zachery Dauer, or Ahern) at [facility] in about 4 weeks. Thanks.     Allergies as of 08/18/2017   No Known Allergies         No Known Allergies Follow-up Information    [facility]. Schedule an appointment as soon as possible for a visit in 4 week(s).   Specialty:  Radiology Contact information:  [address] [phone]           The results of significant diagnostics from this hospitalization (including imaging, microbiology, ancillary and laboratory) are listed below for reference.    Significant Diagnostic Studies: Ct Angio Head W Or Wo Contrast  Result Date: 08/16/2017 CLINICAL DATA:  Stroke follow-up EXAM: CT ANGIOGRAPHY HEAD AND NECK TECHNIQUE: Multidetector CT imaging of the head and neck was performed using the standard protocol during bolus  administration of intravenous contrast. Multiplanar CT image reconstructions and MIPs were obtained to evaluate the vascular anatomy. Carotid stenosis measurements (when applicable) are obtained utilizing NASCET criteria, using the distal internal carotid diameter as the denominator. CONTRAST:  43mL ISOVUE-370 IOPAMIDOL (ISOVUE-370) INJECTION 76% COMPARISON:  Head CT 08/15/2017 FINDINGS: CTA NECK FINDINGS AORTIC ARCH: There is moderate low density atherosclerosis of the aortic arch. On the most inferior image of the study, there is a small central filling defect within the aortic arch. This is unchanged compared to 07/03/2017. There is no aneurysm, dissection or hemodynamically significant stenosis of the visualized ascending aorta and aortic arch. Conventional 3 vessel aortic branching pattern. Moderate stenosis of the origin of the left subclavian artery which is otherwise normal. RIGHT CAROTID SYSTEM: --Common carotid artery: Widely patent origin without common carotid artery dissection or aneurysm. --Internal carotid artery: No dissection, occlusion or aneurysm. No hemodynamically significant stenosis. --External carotid artery: No acute abnormality. LEFT CAROTID SYSTEM: --Common carotid artery: Widely patent origin without common carotid artery dissection or aneurysm. --Internal carotid artery:No dissection, occlusion or aneurysm. No hemodynamically significant stenosis. --External carotid artery: No acute abnormality. VERTEBRAL ARTERIES: Left dominant configuration. Severe narrowing of the origin of the right vertebral artery, unchanged. The right vertebral artery remains diminutive along its entire course and terminates in the right PICA. The left vertebral artery origin is normal. The left vertebral artery is normal to the skull base. SKELETON: There is no bony spinal canal stenosis. No lytic or blastic lesion. OTHER NECK: Likely paralysis of the right vocal cord. UPPER CHEST: Biapical emphysema. CTA HEAD  FINDINGS ANTERIOR CIRCULATION: --Intracranial internal carotid arteries: Atherosclerotic calcification of the internal carotid arteries at the skull base without hemodynamically significant stenosis. --Anterior cerebral arteries: Normal. Both A1 segments are present. Patent anterior communicating artery. --Middle cerebral arteries: Chronic occlusion of the right M1 segment. --Posterior communicating arteries: Absent bilaterally. POSTERIOR CIRCULATION: --Basilar artery: Normal. --Posterior cerebral arteries: Normal. --Superior cerebellar arteries: Normal. --Inferior cerebellar arteries: Normal anterior and posterior inferior cerebellar arteries. VENOUS SINUSES: As permitted by contrast timing, patent. ANATOMIC VARIANTS: None DELAYED PHASE: No parenchymal contrast enhancement. Review of the MIP images confirms the above findings. IMPRESSION: 1. No new emergent large vessel occlusion. 2. 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 3. Unchanged severe stenosis or occlusion of the origin of the right vertebral artery, which is otherwise patent to the skull base. 4. No hemodynamically significant stenosis of the carotid arteries. 5. Bulky noncalcified plaque in the aortic arch with moderate stenosis of the proximal left subclavian artery. 6. Dilated right laryngeal ventricle and piriform sinus suggesting right-sided vocal cord paralysis. Aortic Atherosclerosis (ICD10-I70.0) and Emphysema (ICD10-J43.9). Electronically Signed   By: Ulyses Jarred M.D.   On: 08/16/2017 21:26   Dg Chest 2 View  Result Date: 08/15/2017 CLINICAL DATA:  Patient admitted for stroke.  Weakness. EXAM: CHEST - 2 VIEW COMPARISON:  07/04/2017 FINDINGS: The heart size and mediastinal contours are within normal limits. Both lungs are clear. Subpleural scarring  is seen along the periphery of the left lung base. Right-sided PICC line catheter terminates in the distal SVC. The visualized skeletal structures are unremarkable.  IMPRESSION: No active cardiopulmonary disease. Electronically Signed   By: Ashley Royalty M.D.   On: 08/15/2017 14:18   Ct Head Wo Contrast  Result Date: 08/15/2017 CLINICAL DATA:  Recent stroke in April 2019. Difficulty walking without falling. Headache and lower extremity pain. EXAM: CT HEAD WITHOUT CONTRAST TECHNIQUE: Contiguous axial images were obtained from the base of the skull through the vertex without intravenous contrast. COMPARISON:  None. FINDINGS: Brain: No mass lesion, intraparenchymal hemorrhage or extra-axial collection. No evidence of acute cortical infarct. Bilateral old basal ganglia lacunar infarcts. There is periventricular hypoattenuation compatible with chronic microvascular disease. Vascular: Atherosclerotic calcification of the internal carotid arteries at the skull base. No hyperdense vessel. Skull: Normal visualized skull base, calvarium and extracranial soft tissues. Sinuses/Orbits: No fluid levels or advanced mucosal thickening of the visualized paranasal sinuses. No mastoid or middle ear effusion. The orbits are normal. IMPRESSION: Old bilateral basal ganglia lacunar infarcts and sequelae of chronic small vessel disease without acute intracranial abnormality. Electronically Signed   By: Ulyses Jarred M.D.   On: 08/15/2017 14:29   Ct Angio Neck W Or Wo Contrast  Result Date: 08/16/2017 CLINICAL DATA:  Stroke follow-up EXAM: CT ANGIOGRAPHY HEAD AND NECK TECHNIQUE: Multidetector CT imaging of the head and neck was performed using the standard protocol during bolus administration of intravenous contrast. Multiplanar CT image reconstructions and MIPs were obtained to evaluate the vascular anatomy. Carotid stenosis measurements (when applicable) are obtained utilizing NASCET criteria, using the distal internal carotid diameter as the denominator. CONTRAST:  44mL ISOVUE-370 IOPAMIDOL (ISOVUE-370) INJECTION 76% COMPARISON:  Head CT 08/15/2017 FINDINGS: CTA NECK FINDINGS AORTIC ARCH: There  is moderate low density atherosclerosis of the aortic arch. On the most inferior image of the study, there is a small central filling defect within the aortic arch. This is unchanged compared to 07/03/2017. There is no aneurysm, dissection or hemodynamically significant stenosis of the visualized ascending aorta and aortic arch. Conventional 3 vessel aortic branching pattern. Moderate stenosis of the origin of the left subclavian artery which is otherwise normal. RIGHT CAROTID SYSTEM: --Common carotid artery: Widely patent origin without common carotid artery dissection or aneurysm. --Internal carotid artery: No dissection, occlusion or aneurysm. No hemodynamically significant stenosis. --External carotid artery: No acute abnormality. LEFT CAROTID SYSTEM: --Common carotid artery: Widely patent origin without common carotid artery dissection or aneurysm. --Internal carotid artery:No dissection, occlusion or aneurysm. No hemodynamically significant stenosis. --External carotid artery: No acute abnormality. VERTEBRAL ARTERIES: Left dominant configuration. Severe narrowing of the origin of the right vertebral artery, unchanged. The right vertebral artery remains diminutive along its entire course and terminates in the right PICA. The left vertebral artery origin is normal. The left vertebral artery is normal to the skull base. SKELETON: There is no bony spinal canal stenosis. No lytic or blastic lesion. OTHER NECK: Likely paralysis of the right vocal cord. UPPER CHEST: Biapical emphysema. CTA HEAD FINDINGS ANTERIOR CIRCULATION: --Intracranial internal carotid arteries: Atherosclerotic calcification of the internal carotid arteries at the skull base without hemodynamically significant stenosis. --Anterior cerebral arteries: Normal. Both A1 segments are present. Patent anterior communicating artery. --Middle cerebral arteries: Chronic occlusion of the right M1 segment. --Posterior communicating arteries: Absent  bilaterally. POSTERIOR CIRCULATION: --Basilar artery: Normal. --Posterior cerebral arteries: Normal. --Superior cerebellar arteries: Normal. --Inferior cerebellar arteries: Normal anterior and posterior inferior cerebellar arteries. VENOUS SINUSES: As permitted  by contrast timing, patent. ANATOMIC VARIANTS: None DELAYED PHASE: No parenchymal contrast enhancement. Review of the MIP images confirms the above findings. IMPRESSION: 1. No new emergent large vessel occlusion. 2. 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 3. Unchanged severe stenosis or occlusion of the origin of the right vertebral artery, which is otherwise patent to the skull base. 4. No hemodynamically significant stenosis of the carotid arteries. 5. Bulky noncalcified plaque in the aortic arch with moderate stenosis of the proximal left subclavian artery. 6. Dilated right laryngeal ventricle and piriform sinus suggesting right-sided vocal cord paralysis. Aortic Atherosclerosis (ICD10-I70.0) and Emphysema (ICD10-J43.9). Electronically Signed   By: Ulyses Jarred M.D.   On: 08/16/2017 21:26   Mr Brain Wo Contrast  Result Date: 08/16/2017 CLINICAL DATA:  Acute onset inability to walk 3 days ago. Recent endovascular repair of ruptured aortic aneurysm, bilateral iliofemoral endarterectomies. History of hypertension. EXAM: MRI HEAD WITHOUT CONTRAST TECHNIQUE: Multiplanar, multiecho pulse sequences of the brain and surrounding structures were obtained without intravenous contrast. COMPARISON:  CT HEAD Aug 15, 2017 and MRI of the head June 28, 2017 FINDINGS: INTRACRANIAL CONTENTS: 3 new subcentimeter foci of reduced diffusion within RIGHT cerebellum, LEFT cerebellum and RIGHT cervicomedullary junction, identifiable lesions demonstrate low ADC values.No susceptibility artifact to suggest acute blood products. Old bilateral basal ganglia infarcts with ex vacuo dilatation subjacent lateral ventricle. Old bilateral thalami small  infarcts. Old small cerebellar infarcts. Borderline parenchymal brain volume loss for age. No hydrocephalus. No midline shift, mass effect or masses. VASCULAR: T2 bright signal RIGHT vertebral artery. SKULL AND UPPER CERVICAL SPINE: No abnormal sellar expansion. No suspicious calvarial bone marrow signal. Craniocervical junction maintained. SINUSES/ORBITS: Trace paranasal sinus mucosal thickening. Mastoid air cells are well aerated.The included ocular globes and orbital contents are non-suspicious. OTHER: None. IMPRESSION: 1. Acute subcentimeter infarcts bilateral cerebella and RIGHT cervicomedullary junction. 2. Slow flow versus occluded RIGHT vertebral artery. 3. 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 Old small cerebellar infarcts. Electronically Signed   By: Elon Alas M.D.   On: 08/16/2017 00:32   Ct Angio Chest Aorta W/cm &/or Wo/cm  Result Date: 08/17/2017 CLINICAL DATA:  Nontraumatic aortic disease. EXAM: CT ANGIOGRAPHY CHEST WITH CONTRAST TECHNIQUE: Multidetector CT imaging of the chest was performed using the standard protocol during bolus administration of intravenous contrast. Multiplanar CT image reconstructions and MIPs were obtained to evaluate the vascular anatomy. CONTRAST:  <See Chart> ISOVUE-370 IOPAMIDOL (ISOVUE-370) INJECTION 76% COMPARISON:  CT abdomen pelvis 07/08/2017 FINDINGS: Cardiovascular: 4.5 cm ascending thoracic aortic aneurysm with soft plaque along the ascending thoracic aorta, arch and to a lesser degree the descending thoracic aorta. Minimal ulcerated plaque posteriorly within the ascending thoracic aorta, series 5/71. No dissection is visualized. Conventional branch pattern of the great vessels atherosclerosis. No significant stenosis. Left vertebral arterial dominance noted. Heart is top-normal without pericardial effusion or thickening. Mediastinum/Nodes: Normal thyroid without mass. Patent midline trachea and mainstem bronchi. Mild esophageal  thickening proximally query esophagitis. No large central pulmonary embolus. Lungs/Pleura: Bilateral centrilobular and paraseptal emphysema. No dominant mass. Dependent bibasilar atelectasis and mild pleural thickening along the left major fissure. Upper Abdomen: Nonspecific 1.6 x 1 cm hyperdense nodule of the left adrenal gland. Simple cyst noted off the upper pole of the right kidney measuring 2 cm. An interpolar left renal cyst is also noted too small to further characterize measuring approximately 7 mm. Numerous gallstones are present within the gallbladder. Remainder of the abdomen is unremarkable. Musculoskeletal: No chest wall abnormality. No  acute or significant osseous findings. Review of the MIP images confirms the above findings. IMPRESSION: 1. 4.5 cm ascending thoracic aortic aneurysm with soft plaque as above described, some which is minimally ulcerated along the posterior aspect of the distal ascending aorta. No dissection. Recommend semi-annual imaging followup by CTA or MRA and referral to cardiothoracic surgery if not already obtained. This recommendation follows 2010 ACCF/AHA/AATS/ACR/ASA/SCA/SCAI/SIR/STS/SVM Guidelines for the Diagnosis and Management of Patients With Thoracic Aortic Disease. Circulation. 2010; 121: A263-F354 2. Mild cardiomegaly without pericardial effusion or thickening. 3. No acute pulmonary embolus. 4. Bilateral centrilobular and paraseptal emphysema with dependent bibasilar atelectasis. 5. Indeterminate left adrenal nodule measuring 1.6 x 1 cm. This could be further correlated with dedicated CT or MRI. 6. Uncomplicated cholelithiasis. 7. Bilateral renal cysts. Aortic aneurysm NOS (ICD10-I71.9). Aortic Atherosclerosis (ICD10-I70.0) and Emphysema (ICD10-J43.9). Electronically Signed   By: Ashley Royalty M.D.   On: 08/17/2017 14:51   Ct Angio Abd/pel W And/or Wo Contrast  Result Date: 08/15/2017 CLINICAL DATA:  62 year old male with a history of ruptured abdominal aortic  aneurysm, endovascular repair performed 07/01/2017 EXAM: CTA ABDOMEN AND PELVIS wITHOUT AND WITH CONTRAST TECHNIQUE: Multidetector CT imaging of the abdomen and pelvis was performed using the standard protocol during bolus administration of intravenous contrast. Multiplanar reconstructed images and MIPs were obtained and reviewed to evaluate the vascular anatomy. CONTRAST:  100 cc  ISOVUE-370 IOPAMIDOL (ISOVUE-370) INJECTION 76% COMPARISON:  07/08/2017, 07/03/2017, 07/01/2017 FINDINGS: VASCULAR Aorta: Re- demonstration of endovascular repair of ruptured infrarenal abdominal aortic aneurysm, main device from the right and the contralateral gate from the left. Infrarenal fixation, with the graft margin just below the lowest, left, renal artery. No evidence of a type 1 or a type 2 endoleak. Intense differential enhancement of the wall of the excluded aneurysm on the delayed images. Significantly decreased size of the retroperitoneal hematoma adjacent to the psoas muscle. Greatest diameter today measures 3.0 cm x 3.4 cm on the axial images. Fluid extends inferiorly in a cone like configuration lateral to the left psoas muscle overlying the iliacus. Near complete resolution of hematoma surrounding the left iliac vasculature. The right limb terminates within the common iliac artery. The left limb terminates within the left common iliac artery. Greatest diameter of the excluded aneurysm sac measures 7.2 cm (image 23 of series 15), which is larger than the comparison CT studies. Interval resolution of gas within the aneurysm sac. Celiac: Celiac arteries patent. No significant atherosclerotic changes at the origin. Traditional branch pattern of the celiac artery. Accessory left hepatic artery. SMA: Superior mesenteric artery is patent with no significant atherosclerotic changes at the origin. Renals: Main left and right renal arteries are patent. Again, irregularity at the proximal right renal artery may represent a fusiform  aneurysm, with associated stenosis. Small accessory left renal artery above the main renal artery. Main renal artery appears patent, just above the proximal stent graft. IMA: IMA appears occluded at the origin. Left colic artery and superior rectal artery are patent via collateral flow. Right lower extremity: The right iliac limb is patent, with near complete interval resolution of the mural thrombus/thickening, with maintained flow channel. No evidence of a type 1 B endoleak. The hypogastric artery appears occluded in the interval. External iliac artery is patent. Surgical changes at the right common femoral artery. Proximal profunda femoris and superficial femoral artery patent. Left lower extremity: Left iliac limb is patent. No evidence of a type 1 B endoleak. Left hypogastric artery and external iliac artery are patent. Proximal SFA  and profunda femoris patent. Veins: Unremarkable appearance of the venous system. Review of the MIP images confirms the above findings. NON-VASCULAR Lower chest: Evidence of mild interlobular septal thickening. Linear opacities at the lung bases. Calcified granuloma of the right middle lobe. Hepatobiliary: Unremarkable appearance of the liver. Hyperdense material within the gallbladder lumen compatible with cholelithiasis. No evidence of associated inflammatory changes. Pancreas: Unremarkable pancreas Spleen: Unremarkable spleen Adrenals/Urinary Tract: Enhancing lesion of the left adrenal gland. Unremarkable appearance of the right adrenal gland. Right: No right-sided hydronephrosis. Similar appearance of cystic lesion at the superior right kidney. No nephrolithiasis. Unremarkable course the right ureter. Left: No left-sided hydronephrosis. 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 Unremarkable course the left ureter. Urinary bladder partially distended. Stomach/Bowel: Unremarkable stomach . Unremarkable small bowel. No abnormal distention. Moderate stool burden. No  transition point. No inflammatory changes. Normal appendix. Colonic diverticular present without associated inflammation. No inflammatory changes within the small bowel mesenteric. Lymphatic: No lymphadenopathy. Reproductive: Unremarkable appearance of the pelvic organs. Other: Small fat containing umbilical hernia. Musculoskeletal: No acute displaced fracture. Mild degenerative changes of the spine. IMPRESSION: Re-demonstration of endovascular repair of ruptured infrarenal abdominal aortic aneurysm. No evidence of endoleak or stent graft migration. The aneurysm sac demonstrates no internal enhancement or continued flow, however, there is intense mural enhancement on the delayed images, and the greatest diameter of the aneurysm sac has enlarged to a greatest diameter of 7.2 cm, previously 6.6 cm on presentation. The etiology of the enlargement is uncertain, potentially reactive or post inflammatory. Although gas has resolved in the interval, seen on the prior and most likely postoperative changes, infection cannot be excluded. There has been near complete resolution of the right limb mural thrombus, with patency maintained of the bilateral iliac limbs. Interval occlusion of the right hypogastric artery. Left hypogastric artery remains patent. Interval improvement of retroperitoneal hematoma, with near complete resolution adjacent to the iliac vasculature, and small persisting fluid collection lateral to the left psoas muscle. These results were called by telephone at the time of interpretation on 08/15/2017 at 4:09 pm to Dr. Jeannie Done PFEIFFER. 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 Correlation with duplex may be useful. Cholelithiasis. Evidence of mild pulmonary edema. Signed, Dulcy Fanny. Earleen Newport, DO [facility] Electronically Signed   By: Corrie Mckusick D.O.   On: 08/15/2017  16:09    Microbiology: Recent Results (from the past 240 hour(s))  Culture, blood (routine x 2)     Status: None (Preliminary result)   Collection Time: 08/15/17  4:40 PM  Result Value Ref Range Status   Specimen Description BLOOD RIGHT ARM  Final   Special Requests   Final    BOTTLES DRAWN AEROBIC AND ANAEROBIC Blood Culture results may not be optimal due to an inadequate volume of blood received in culture bottles   Culture   Final    NO GROWTH 3 DAYS Performed at [facility], [place] [address], [place], [place] [address]    Report Status PENDING  Incomplete  Culture, blood (routine x 2)     Status: None (Preliminary result)   Collection Time: 08/15/17  4:50 PM  Result Value Ref Range Status   Specimen Description BLOOD RIGHT FOREARM  Final   Special Requests   Final    BOTTLES DRAWN AEROBIC AND ANAEROBIC Blood Culture adequate volume   Culture   Final    NO GROWTH 3 DAYS Performed at  [facility], [place] [address], [place], [place] [address]    Report Status PENDING  Incomplete     Labs: Basic Metabolic Panel: Recent Labs  Lab 08/15/17 1016 08/16/17 0625 08/17/17 0440 08/18/17 0139  NA 138 138 139 138  K 4.2 4.0 4.4 4.3  CL 103 106 105 105  CO2 26 25 26 25   GLUCOSE 95 86 90 98  BUN 18 15 16 19   CREATININE 1.41* 1.27* 1.39* 1.47*  CALCIUM 9.5 9.0 9.0 9.0  MG  --   --  1.8 1.8   Liver Function Tests: Recent Labs  Lab 08/15/17 1016 08/18/17 0139  AST 25 22  ALT 20 16*  ALKPHOS 113 97  BILITOT 0.7 0.8  PROT 8.9* 8.4*  ALBUMIN 3.5 3.0*   Recent Labs  Lab 08/15/17 1016  LIPASE 31   No results for input(s): AMMONIA in the last 168 hours. CBC: Recent Labs  Lab 08/15/17 1016 08/16/17 0625 08/17/17 0440 08/18/17 0139  WBC 11.8* 11.9* 11.7* 11.7*  NEUTROABS 6.1  --   --   --   HGB 13.3 12.2* 11.6* 11.8*  HCT 42.2 37.9* 36.3* 36.5*  MCV 78.9 78.1 78.4 77.7*  PLT 383 356 332 350   Cardiac Enzymes: No results for input(s): CKTOTAL, CKMB,  CKMBINDEX, TROPONINI in the last 168 hours. BNP: BNP (last 3 results) No results for input(s): BNP in the last 8760 hours.  ProBNP (last 3 results) No results for input(s): PROBNP in the last 8760 hours.  CBG: No results for input(s): GLUCAP in the last 168 hours.     Signed:  Fayrene Helper MD.  [facility] 08/18/2017, 1:48 PM

## 2017-08-18 NOTE — Progress Notes (Signed)
Patient ID: Thomas [name], male   DOB: 11/20/1955, 62 y.o.   MRN: [id number] Patient and family arrived from 3W11 via wheelchair. Patient and family oriented to room, nurse call system, health resource notebook, fall prevention plan, rehab safety plan, and rehab process. Patient and family communicated verbal understanding. Patient resting comfortably in bed with family in room, call bed, and bed alarm in place.

## 2017-08-18 NOTE — Progress Notes (Signed)
ANTICOAGULATION CONSULT NOTE - Follow-up Consult  Pharmacy Consult for Enoxaparin and Warfarin Indication: Soft plaque (clot) in ascending arch and descending aorta  No Known Allergies  Patient Measurements: Height: 5\' 7"  (170.2 cm) Weight: 125 lb (56.7 kg) IBW/kg (Calculated) : 66.1 Heparin Dosing Weight: 56.7 kg  Vital Signs: Temp: 97.9 F (36.6 C) (05/25 0408) Temp Source: Oral (05/25 0408) BP: 133/72 (05/25 0408) Pulse Rate: 67 (05/25 0408)  Labs: Recent Labs    08/16/17 0625 08/17/17 0440 08/18/17 0139  HGB 12.2* 11.6* 11.8*  HCT 37.9* 36.3* 36.5*  PLT 356 332 350  LABPROT  --   --  13.6  INR  --   --  1.05  CREATININE 1.27* 1.39* 1.47*    Estimated Creatinine Clearance: 41.3 mL/min (A) (by C-G formula based on SCr of 1.47 mg/dL (H)).   Medical History: Past Medical History:  Diagnosis Date  . Hypertension   . Stroke [facility])      Assessment: 62 yo male with clot in aorta which was the likely source of his embolic stroke  Goal of Therapy:  INR 2-3 Monitor platelets by anticoagulation protocol: Yes   Plan:  Lovenox 60 mg subq q12hr Coumadin 5 mg po x 1 today Monitor CBC and follow up INR daily Monitor for signs and symptoms of bleeding Monitor renal function.   Meleny Tregoning A. Levada Dy, PharmD, [place] Pager: [phone]  08/18/2017,10:55 AM

## 2017-08-18 NOTE — Progress Notes (Signed)
Pt discharge education and instructions completed. Report called off to nurse [place] in [place]. Delia Heady RN

## 2017-08-19 ENCOUNTER — Inpatient Hospital Stay (HOSPITAL_COMMUNITY): Payer: Private Health Insurance - Indemnity | Admitting: Occupational Therapy

## 2017-08-19 ENCOUNTER — Inpatient Hospital Stay (HOSPITAL_COMMUNITY): Payer: Private Health Insurance - Indemnity

## 2017-08-19 DIAGNOSIS — N182 Chronic kidney disease, stage 2 (mild): Secondary | ICD-10-CM

## 2017-08-19 LAB — CBC WITH DIFFERENTIAL/PLATELET
Abs Immature Granulocytes: 0 10*3/uL (ref 0.0–0.1)
BASOS ABS: 0.1 10*3/uL (ref 0.0–0.1)
BASOS PCT: 1 %
EOS PCT: 17 %
Eosinophils Absolute: 1.9 10*3/uL — ABNORMAL HIGH (ref 0.0–0.7)
HCT: 37.7 % — ABNORMAL LOW (ref 39.0–52.0)
Hemoglobin: 12.3 g/dL — ABNORMAL LOW (ref 13.0–17.0)
Immature Granulocytes: 0 %
Lymphocytes Relative: 38 %
Lymphs Abs: 4.1 10*3/uL — ABNORMAL HIGH (ref 0.7–4.0)
MCH: 25.2 pg — AB (ref 26.0–34.0)
MCHC: 32.6 g/dL (ref 30.0–36.0)
MCV: 77.3 fL — ABNORMAL LOW (ref 78.0–100.0)
MONO ABS: 0.6 10*3/uL (ref 0.1–1.0)
Monocytes Relative: 5 %
NEUTROS ABS: 4.2 10*3/uL (ref 1.7–7.7)
Neutrophils Relative %: 39 %
PLATELETS: 364 10*3/uL (ref 150–400)
RBC: 4.88 MIL/uL (ref 4.22–5.81)
RDW: 15.8 % — AB (ref 11.5–15.5)
WBC: 10.8 10*3/uL — ABNORMAL HIGH (ref 4.0–10.5)

## 2017-08-19 LAB — COMPREHENSIVE METABOLIC PANEL
ALT: 16 U/L — ABNORMAL LOW (ref 17–63)
ANION GAP: 6 (ref 5–15)
AST: 20 U/L (ref 15–41)
Albumin: 3 g/dL — ABNORMAL LOW (ref 3.5–5.0)
Alkaline Phosphatase: 92 U/L (ref 38–126)
BUN: 13 mg/dL (ref 6–20)
CALCIUM: 9 mg/dL (ref 8.9–10.3)
CHLORIDE: 108 mmol/L (ref 101–111)
CO2: 26 mmol/L (ref 22–32)
Creatinine, Ser: 1.3 mg/dL — ABNORMAL HIGH (ref 0.61–1.24)
GFR calc non Af Amer: 57 mL/min — ABNORMAL LOW (ref >60)
Glucose, Bld: 91 mg/dL (ref 65–99)
Potassium: 3.6 mmol/L (ref 3.5–5.1)
SODIUM: 140 mmol/L (ref 135–145)
Total Bilirubin: 0.6 mg/dL (ref 0.3–1.2)
Total Protein: 7.7 g/dL (ref 6.5–8.1)

## 2017-08-19 LAB — PROTIME-INR
INR: 1.09
Prothrombin Time: 14 seconds (ref 11.4–15.2)

## 2017-08-19 MED ORDER — WARFARIN SODIUM 5 MG PO TABS
5.0000 mg | ORAL_TABLET | Freq: Once | ORAL | Status: AC
  Administered 2017-08-19: 5 mg via ORAL
  Filled 2017-08-19: qty 1

## 2017-08-19 NOTE — Progress Notes (Signed)
ANTICOAGULATION CONSULT NOTE - Follow-up Consult  Pharmacy Consult for Enoxaparin and Warfarin Indication: Soft plaque (clot) in ascending arch and descending aorta  No Known Allergies  Patient Measurements: Height: 5\' 7"  (170.2 cm) Weight: 118 lb 9.7 oz (53.8 kg) IBW/kg (Calculated) : 66.1 Heparin Dosing Weight: 56.7 kg  Vital Signs: Temp: 98.7 F (37.1 C) (05/26 0617) Temp Source: Oral (05/26 0617) BP: 147/84 (05/26 0617) Pulse Rate: 86 (05/26 0617)  Labs: Recent Labs    08/17/17 0440 08/18/17 0139 08/19/17 0448  HGB 11.6* 11.8* 12.3*  HCT 36.3* 36.5* 37.7*  PLT 332 350 364  LABPROT  --  13.6 14.0  INR  --  1.05 1.09  CREATININE 1.39* 1.47* 1.30*   Estimated Creatinine Clearance: 44.3 mL/min (A) (by C-G formula based on SCr of 1.3 mg/dL (H)).  Assessment: 62 yo male with clot in aorta which was the likely source of his embolic stroke. Bridging with Lovenox to Coumadin.  INR 1.09 this morning. CBC stable and no bleeding noted.  Goal of Therapy:  INR 2-3 Monitor platelets by anticoagulation protocol: Yes   Plan:  Lovenox 60 mg SQ q12hr Coumadin 5 mg PO x 1 today Monitor CBC and follow up INR daily Monitor for signs and symptoms of bleeding Monitor renal function   Anuja Manka L. Kyung Rudd, PharmD, [place] PGY1 Pharmacy Resident Pager: [phone]

## 2017-08-19 NOTE — Evaluation (Signed)
Occupational Therapy Assessment and Plan  Patient Details  Name: Thomas [name] MRN: [id number] Date of Birth: [birth year]  OT Diagnosis: ataxia, disturbance of vision and hemiplegia affecting dominant side Rehab Potential: Rehab Potential (ACUTE ONLY): Good ELOS: 10-12 days   Today's Date: 08/19/2017 OT Individual Time:  -   1st session:  800- 0915   (75 mins)                                                      Problem List:  Patient Active Problem List   Diagnosis Date Noted  . Chronic ischemic vertebrobasilar artery cerebellar stroke 08/18/2017  . Cerebellar artery embolism   . Gait disorder   . Cerebral embolism with cerebral infarction 08/16/2017  . Paraparesis ([place])   . Hypertension 08/15/2017  . Gait instability 08/15/2017  . Pelvic pain 08/15/2017  . Chronic kidney disease 08/15/2017  . Abnormal CT scan, pelvis 08/15/2017  . Meniere disease, left 08/08/2017  . Sepsis due to Streptococcus pneumoniae ([place])   . Acute respiratory failure with hypoxia ([place])   . Hemorrhagic shock ([place]) 07/02/2017  . Acute blood loss anemia 07/02/2017  . S/P AAA repair   . LLQ abdominal pain   . Ruptured abdominal aortic aneurysm (AAA) ([place])   . Hypotension   . Bacteremia due to Streptococcus 06/30/2017  . CVA (cerebral vascular accident) ([place]) 06/30/2017  . Chest pain 06/28/2017  . CAP (community acquired pneumonia): Clinical 06/28/2017  . Acute renal failure superimposed on stage 3 chronic kidney disease ([place]) 06/28/2017  . Dehydration 06/28/2017  . Leukocytosis 06/28/2017  . Elevated troponin 06/28/2017  . Dizziness 06/28/2017  . Vertigo 06/28/2017  . Acute pericarditis: Probable 06/28/2017    Past Medical History:  Past Medical History:  Diagnosis Date  . Hypertension   . Stroke [facility])    Past Surgical History:  Past Surgical History:  Procedure Laterality Date  . ENDOVASCULAR STENT INSERTION N/A 07/01/2017   Procedure: ENDOVASCULAR STENT GRAFT INSERTION;  Surgeon: Serafina Mitchell, MD;  Location: [facility] OR;  Service: Vascular;  Laterality: N/A;  . THROMBECTOMY FEMORAL ARTERY Bilateral 07/01/2017   Procedure: Bilateral IlioFemoral Thrombectomies;  Surgeon: Serafina Mitchell, MD;  Location: [facility] OR;  Service: Vascular;  Laterality: Bilateral;    Assessment & Plan Clinical Impression: Patient is a 62 y.o. year old male with recent admission to the hospital on admitted on 5 /22/19with 3-day history of bilateral lower extremity weakness with abdominal and bilateral groin pain. CT abdomen pelvis done showed no evidence of leak and intense mural enhancement felt to be due to postop changes per Dr. Gwenlyn Saran. MRI of brain was repeated and showed acute subcentimeter infarcts bilateral cerebellar and right cervical medullary junction as well as slow flow versus occluded right-VA. Neurology was consulted for input and recommended DAPT with ECHO/TEE to rule out cardioembolic source. 2D echo done revealing EF 65-70% with moderate AVR.CTA chest 5/24 revealing 4.5 cm thoracic aortic aneurysm with soft plaque and minimal ulcerative plaque and bilateral centrilobar/paraseptal emphysema. Dr. Erlinda Hong recommended lovenox bridge with coumadin and low dose ASA for extensive soft plaque likely cause of stroke.     Patient transferred to CIR on 08/18/2017 .    Patient currently requires mod with basic self-care skills secondary to impaired timing and sequencing, motor apraxia, ataxia and decreased coordination, field cut and peripheral.  Prior to hospitalization, patient could complete BADL and IADL  with independent level.    Patient will benefit from skilled intervention to increase independence with basic self-care skills and increase level of independence with iADL prior to discharge home with care partner.  Anticipate patient will require intermittent supervision and follow up home health.  OT - End of Session Activity Tolerance: Tolerates < 10 min activity, no significant change in vital  signs Endurance Deficit: Yes OT Assessment Rehab Potential (ACUTE ONLY): Good OT Barriers to Discharge: Decreased caregiver support OT Barriers to Discharge Comments: (Daughter lives close by but works; pt lives with cousin ) OT Patient demonstrates impairments in the following area(s): Balance;Endurance;Motor;Safety;Sensory;Vision OT Basic ADL's Functional Problem(s): Grooming;Bathing;Dressing;Toileting OT Advanced ADL's Functional Problem(s): Simple Meal Preparation;Laundry;Light Housekeeping OT Transfers Functional Problem(s): Toilet;Tub/Shower OT Additional Impairment(s): Other (comment)(attention to the right environment) OT Plan OT Intensity: Minimum of 1-2 x/day, 45 to 90 minutes OT Frequency: 5 out of 7 days OT Duration/Estimated Length of Stay: 10-12 days OT Treatment/Interventions: Balance/vestibular training;Community reintegration;Discharge planning;DME/adaptive equipment instruction;Functional mobility training;Neuromuscular re-education;Pain management;Patient/family education;Self Care/advanced ADL retraining;Therapeutic Activities;Therapeutic Exercise;UE/LE Strength taining/ROM;UE/LE Coordination activities;Visual/perceptual remediation/compensation OT Self Feeding Anticipated Outcome(s): Independent OT Basic Self-Care Anticipated Outcome(s): mod I with bathing, dressing, grooming OT Toileting Anticipated Outcome(s): mod I OT Bathroom Transfers Anticipated Outcome(s): mod I to toilet; supervision to tub/shower OT Recommendation Patient destination: Home Follow Up Recommendations: Home health OT Equipment Recommended: Tub/shower bench;Rolling walker with 5" wheels   Skilled Therapeutic Intervention  Pt seen for self care assessment, transfers, balance, functional mobility, endurance, and vision.  Pt has IV and did not take shower.  Simulated shower transfer with mod assist with step over side of tub with grab bars and assist.  Ppt ambulated with min assist and cues for  attending to right side so to monitor hitting things on right.  Needs rest breaks every 10 minutes with moderate activity.  Pt stated he wanted to be walking with no one supervising him.  Daughter and cousin are available prn.   OT Evaluation Precautions/Restrictions  Precautions Precautions: Fall Restrictions Weight Bearing Restrictions: No General   Vital Signs Therapy Vitals Temp: 97.8 F (36.6 C) Pulse Rate: 77 Resp: 16 BP: 132/81 Patient Position (if appropriate): Lying Oxygen Therapy SpO2: 100 % O2 Device: Room Air Pain Dizzy with head turns, but no pain   Home Living/Prior Functioning Home Living Family/patient expects to be discharged to:: Private residence Living Arrangements: Alone Available Help at Discharge: Family, Friend(s), Available PRN/intermittently Type of Home: House Home Access: Stairs to enter Technical brewer of Steps: 3 Entrance Stairs-Rails: None Home Layout: One level Bathroom Shower/Tub: Chiropodist: Standard Additional Comments: daughter works at [employer] and is at [employer] for business degree  Lives With: Family IADL History Homemaking Responsibilities: Yes Meal Prep Responsibility: Secondary Laundry Responsibility: Secondary Cleaning Responsibility: Secondary Bill Paying/Finance Responsibility: Secondary Shopping Responsibility: Primary Current License: Yes Mode of Transportation: Car Occupation: Full time employment Type of Occupation: Presenter, broadcasting, but plans to retire Prior Function Level of Independence: Independent with basic ADLs, Independent with homemaking with ambulation  Able to Take Stairs?: Yes Driving: Yes Vocation: Full time employment Comments: works in [employer] Patient Visual Report: Blurring of vision;Other (comment)(missing things on the left) Vision Assessment?: Yes Eye Alignment: Within Functional Limits Ocular Range of Motion: Within Functional  Limits Alignment/Gaze Preference: Within Defined Limits Tracking/Visual Pursuits: Decreased smoothness of horizontal tracking;Impaired - to be further tested in functional context Visual Fields: Right  visual field deficit(right lateral field cut ) Diplopia Assessment: (denies) Additional Comments: pt reports his right side of face is numb  Perception  Perception: Within Functional Limits Praxis   Cognition Overall Cognitive Status: Within Functional Limits for tasks assessed Arousal/Alertness: Awake/alert Orientation Level: Person;Place;Situation Person: Oriented Place: Oriented Situation: Oriented Year: 2019 Month: May Day of Week: Correct Memory: Appears intact Memory Impairment: Decreased short term memory Decreased Short Term Memory: Verbal basic;Functional basic Immediate Memory Recall: Blue;Sock;Bed Memory Recall: Blue Memory Recall Blue: Without Cue(Recalled blue after 5 mins;  unable to recall sock, or bed) Attention: Selective Selective Attention: Appears intact Alternating Attention Impairment: Verbal basic;Functional basic Awareness: Impaired Awareness Impairment: Anticipatory impairment Problem Solving: Impaired Problem Solving Impairment: Verbal complex;Functional complex Executive Function: Self Correcting Initiating: Appears intact Self Correcting: Appears intact Safety/Judgment: Impaired Comments: min assist to get 2WW untangled from object on right Sensation Sensation Light Touch: Impaired by gross assessment(right sided numbness from face down to feet) Proprioception: Appears Intact Coordination Gross Motor Movements are Fluid and Coordinated: No Fine Motor Movements are Fluid and Coordinated: No Coordination and Movement Description: decreased balance in functional ambulation  Finger Nose Finger Test: [facility] Motor  Motor Motor: Ataxia Motor - Skilled Clinical Observations: mild UE weakness with right more than left;  LE weakness proximal>distal   Mobility  Bed Mobility Bed Mobility: Rolling Right;Right Sidelying to Sit Rolling Right: 5: Set up Rolling Right Details: Tactile cues for placement;Verbal cues for technique Right Sidelying to Sit: 4: Min guard  Trunk/Postural Assessment  Cervical Assessment Cervical Assessment: Within Functional Limits Thoracic Assessment Thoracic Assessment: Within Functional Limits Lumbar Assessment Lumbar Assessment: Within Functional Limits Postural Control Protective Responses: impaired  Balance Balance Balance Assessed: Yes Static Sitting Balance Static Sitting - Level of Assistance: 6: Modified independent (Device/Increase time) Dynamic Sitting Balance Dynamic Sitting - Balance Support: Left upper extremity supported;Feet supported;During functional activity Dynamic Sitting - Level of Assistance: 5: Stand by assistance Dynamic Sitting - Balance Activities: Reaching for objects;Lateral lean/weight shifting Sitting balance - Comments: minimal assist for dynamic reaching Static Standing Balance Static Standing - Level of Assistance: 4: Min assist Dynamic Standing Balance Dynamic Standing - Balance Support: Left upper extremity supported;During functional activity Dynamic Standing - Level of Assistance: 3: Mod assist Extremity/Trunk Assessment RUE Assessment RUE Assessment: Exceptions to [facility] RUE Strength RUE Overall Strength: Deficits(4/5 strength overall) LUE Assessment LUE Assessment: Within Functional Limits   See Function Navigator for Current Functional Status.   Refer to Care Plan for Long Term Goals  Recommendations for other services: None    Discharge Criteria: Patient will be discharged from OT if patient refuses treatment 3 consecutive times without medical reason, if treatment goals not met, if there is a change in medical status, if patient makes no progress towards goals or if patient is discharged from hospital.  The above assessment, treatment plan, treatment  alternatives and goals were discussed and mutually agreed upon: by patient and by family  Lisa Roca 08/19/2017, 4:24 PM

## 2017-08-19 NOTE — Progress Notes (Signed)
PMR Admission Coordinator Pre-Admission Assessment  Patient: Thomas [name] is an 62 y.o., male MRN: [id number] DOB: January 28, 1956 Height: 5\' 7"  (170.2 cm) Weight: 56.7 kg (125 lb)                                                                                                                                                  Insurance Information HMO:     PPO:      PCP:      IPA:      80/20:      OTHER: Indemnity Plan  PRIMARYRenold [name]       Policy#: [id number]      Subscriber: Self CM Name: N/A      Phone#: [phone]     Fax#: N/A Pre-Cert#: Not required for this policy      Employer: Full Time Benefits:  Phone #: [phone]     Name: Loa Socks 08/17/17, Reference [id number] Eff. Date: 03/27/17     Deduct: $0      Out of Pocket Max: $0      Life Max: $0 CIR: pays $200 a day, days 2+      SNF: pays $200 a day, days 2+ Outpatient: no coverage     Co-Pay:  Home Health: no coverage       Co-Pay:  DME: rental, 5 day max     Co-Pay: $20 per day Providers: In-network   SECONDARY: None        Emergency Contact Information         Contact Information    Name Relation Home Work [place], [place] Daughter   [phone]     Current Medical History  Patient Admitting Diagnosis: Paraparesis lower extremity sensory changes and gait disorder likely had spinal cord ischemia related to AAA rupture, also has had a small cerebellar infarcts  History of Present Illness: Thomas [name] is a 62 year old male with history of CKD,ruptured aneurysm status post bilateral iliofemoral endarterectomy with thromboembolectomy 1/76 complicated by 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 He completed his IV antibiotic; however was admitted on 5/22/19with 3-day history of bilateral lower extremity weakness with abdominal and bilateral groin pain.   CT abdomen pelvis done showed no evidence of leak and intense mural enhancement felt to be due to postop changes per Dr. Gwenlyn Saran. MRI of brain was repeated and showed acute subcentimeter infarcts bilateral cerebellar and right cervical medullary junction as well as slow flow versus occluded right-VA. Neurology was consulted for input and recommended DAPT with ECHO/TEE to rule out cardioembolic source. 2D echo done revealing EF 65-70% with moderate AVR.CTA chestpending.  Therapy evaluations done revealing functional deficits and CIR recommended for follow up therapy.  Discussed with acute attending and neurology  team and anticipate that work up will be completed later today, 08/17/17 and that patient will be medically ready for IP Rehab admission tomorrow, Saturday 08/18/17.     NIH Total: 4  Past Medical History      Past Medical History:  Diagnosis Date  . Hypertension   . Stroke [facility])     Family History  family history is not on file.  Prior Rehab/Hospitalizations:  Has the patient had major surgery during 100 days prior to admission? Yes  Current Medications   Current Facility-Administered Medications:  .  acetaminophen (TYLENOL) tablet 650 mg, 650 mg, Oral, Q6H PRN, 650 mg at 08/17/17 0348 **OR** acetaminophen (TYLENOL) suppository 650 mg, 650 mg, Rectal, Q6H PRN, Black, Karen M, NP .  aspirin EC tablet 325 mg, 325 mg, Oral, Daily, Rosalin Hawking, MD .  atorvastatin (LIPITOR) tablet 80 mg, 80 mg, Oral, q1800, Elodia Florence., MD, 80 mg at 08/16/17 1722 .  bisacodyl (DULCOLAX) suppository 10 mg, 10 mg, Rectal, Daily PRN, Black, Karen M, NP .  clopidogrel (PLAVIX) tablet 75 mg, 75 mg, Oral, Daily, Nyanor, Pearl O, NP, 75 mg at 08/16/17 1237 .  diphenhydrAMINE (BENADRYL) capsule 25 mg, 25 mg, Oral, Q8H PRN, Black, Karen M, NP .  iopamidol (ISOVUE-370) 76 % injection 50 mL, 50 mL, Intravenous, Once PRN, Elodia Florence., MD .  iopamidol (ISOVUE-370) 76 % injection 50  mL, 50 mL, Intravenous, Once, Elodia Florence., MD .  iopamidol (ISOVUE-370) 76 % injection, , , ,  .  labetalol (NORMODYNE,TRANDATE) injection 10 mg, 10 mg, Intravenous, Q2H PRN, Elodia Florence., MD .  ondansetron [facility]) tablet 4 mg, 4 mg, Oral, Q6H PRN **OR** ondansetron (ZOFRAN) injection 4 mg, 4 mg, Intravenous, Q6H PRN, Black, Karen M, NP .  pantoprazole (PROTONIX) EC tablet 40 mg, 40 mg, Oral, Q1200, Black, Karen M, NP, 40 mg at 08/16/17 1237 .  senna-docusate (Senokot-S) tablet 2 tablet, 2 tablet, Oral, QHS, Black, Lezlie Octave, NP, 2 tablet at 08/16/17 2130 .  traZODone (DESYREL) tablet 25 mg, 25 mg, Oral, QHS PRN, Black, Karen M, NP, 25 mg at 08/16/17 0108  Patients Current Diet:       Diet Order           Diet NPO time specified  Diet effective midnight        Diet Heart Room service appropriate? Yes; Fluid consistency: Thin  Diet effective now          Precautions / Restrictions Precautions Precautions: Fall Restrictions Weight Bearing Restrictions: No   Has the patient had 2 or more falls or a fall with injury in the past year?No  Prior Activity Level Community (5-7x/wk): Prior to admission patient was fully independent and worked.    Home Assistive Devices / Equipment Home Equipment: Kasandra Knudsen - single point  Prior Device Use: Indicate devices/aids used by the patient prior to current illness, exacerbation or injury? None of the above  Prior Functional Level Prior Function Level of Independence: Independent Comments: works in [employer]: Did the patient need help bathing, dressing, using the toilet or eating? Independent  Indoor Mobility: Did the patient need assistance with walking from room to room (with or without device)? Independent  Stairs: Did the patient need assistance with internal or external stairs (with or without device)? Independent  Functional Cognition: Did the patient need help planning regular  tasks such as shopping or remembering to take medications? Independent  Current Functional  Level Cognition  Arousal/Alertness: Awake/alert Overall Cognitive Status: Impaired/Different from baseline Orientation Level: Oriented X4 Attention: Alternating Alternating Attention: Impaired Alternating Attention Impairment: Verbal basic, Functional basic Memory: Impaired Memory Impairment: Decreased recall of new information Awareness: Impaired Awareness Impairment: Anticipatory impairment Problem Solving: Impaired Problem Solving Impairment: Verbal complex, Functional complex Safety/Judgment: Appears intact    Extremity Assessment (includes Sensation/Coordination)  Upper Extremity Assessment: Defer to OT evaluation  Lower Extremity Assessment: RLE deficits/detail, LLE deficits/detail RLE Deficits / Details: hip 3+/5, knee 4/5;  ankle 4/5 AROM WFL RLE Coordination: decreased gross motor LLE Deficits / Details: strength grossly WFL; AROM WFL LLE Coordination: decreased gross motor    ADLs       Mobility  Overal bed mobility: Needs Assistance Bed Mobility: Supine to Sit Supine to sit: Supervision General bed mobility comments: for safety and line management    Transfers  Overall transfer level: Needs assistance Equipment used: Rolling walker (2 wheeled) Transfers: Sit to/from Stand Sit to Stand: Min assist General transfer comment: min assist for anterior-superior wt shift and to stabilize once in standing with RW support    Ambulation / Gait / Stairs / Emergency planning/management officer  Ambulation/Gait Ambulation/Gait assistance: Min assist, Mod assist Ambulation Distance (Feet): 12 Feet(in room) Assistive device: Rolling walker (2 wheeled) Gait Pattern/deviations: Step-through pattern, Decreased stride length, Narrow base of support General Gait Details: assist for balance and to maneuver RW in room, multi-modal cues for step length, RW position, heavy use of UEs    Posture /  Balance Balance Overall balance assessment: Needs assistance Sitting-balance support: No upper extremity supported, Feet supported Sitting balance-Leahy Scale: Fair Standing balance-Leahy Scale: Poor Standing balance comment: heavily reliant on UE support for static stand; reliant on UEs and external assist for dynamic balance    Special needs/care consideration BiPAP/CPAP: No CPM: No Continuous Drip IV: No Dialysis: No         Life Vest: No Oxygen: No Special Bed: No Trach Size: No Wound Vac (area): No       Skin: WDL                                Bowel mgmt: Continent, last BM 08/17/17 Bladder mgmt: External catheter  Diabetic mgmt: No, HgbA1c 5.6     Previous Home Environment Living Arrangements: Other relatives(cousin )  Lives With: Family Available Help at Discharge: Family, Friend(s), Available PRN/intermittently Type of Home: House Home Layout: One level Home Access: Stairs to enter Entrance Stairs-Rails: None Entrance Stairs-Number of Steps: 3 Additional Comments: also has local dtr that checks on him, 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, sometimes other hours  Discharge Living Setting Plans for Discharge Living Setting: Lives with (comment)(family ) Type of Home at Discharge: House Discharge Home Layout: One level Discharge Home Access: Stairs to enter Entrance Stairs-Rails: None Entrance Stairs-Number of Steps: back: 2 Discharge Bathroom Shower/Tub: Tub/shower unit Discharge Bathroom Toilet: Standard Discharge Bathroom Accessibility: Yes How Accessible: Accessible via walker Does the patient have any problems obtaining your medications?: No  Social/Family/Support Systems Patient Roles: Other (Comment), Parent(cousin ) Contact Information: Daughter: Madlyn Frankel [phone] Anticipated Caregiver: Mod I goals, family available PRN Anticipated Caregiver's Contact Information: Family: cousin and daughter  Ability/Limitations of Caregiver: They work and can  assist PRN Caregiver Availability: Intermittent Discharge Plan Discussed with Primary Caregiver: Yes Is Caregiver In Agreement with Plan?: Yes Does Caregiver/Family have Issues with Lodging/Transportation while Pt is in Rehab?: No  Goals/Additional  Needs Patient/Family Goal for Rehab: PT/OT/SLP: Mod I  Expected length of stay: 7-10 days  Cultural Considerations: Christian  Dietary Needs: Heart Healthy diet restrictions  Equipment Needs: TBD Pt/Family Agrees to Admission and willing to participate: Yes  Decrease burden of Care through IP rehab admission: No  Possible need for SNF placement upon discharge: No  Patient Condition: This patient's condition remains as documented in the consult dated 08/16/17 at 1535, in which the Rehabilitation Physician determined and documented that the patient's condition is appropriate for intensive rehabilitative care in an inpatient rehabilitation facility. Will admit to inpatient rehab tomorrow.  Preadmission Screen Completed By:  Gunnar Fusi, 08/17/2017 2:19 PM ______________________________________________________________________   Discussed status with Dr. Letta Pate on 08/17/17 at 1414 and received telephone approval for admission tomorrow.  Admission Coordinator:  Gunnar Fusi, time 1414/Date 08/17/17             Cosigned by: Charlett Blake, MD at 08/18/2017 11:55 AM  Revision History

## 2017-08-19 NOTE — Progress Notes (Signed)
Physical Medicine and Rehabilitation Consult   Reason for Consult: Functional deficits due to recent strokes and BLE numbness.  Referring Physician: Dr. Florene [name]  HPI: Thomas [name] is a 62 y.o. male is a 62 year old male with history of CKD, CVA, ruptured aneurysm s/p bilateratl iliofemoral endartectomy with thromboembolectomy 03/31/38 complicated by right iliac instent thrombosis with RLE numbness and mild right foot drop, left cerebellar infarcts, hemorrhagic shock and pneumococcal bacteremia with probable acute pericarditis. He was admitted on 08/15/17 with 3 day history of BLE weakness with abdominal and groin pain. CT abdomen/pelvis showed no evidence of leak and intense mural enhancement felt to be due to post op changes per Dr. Donzetta [name]. MRI brain repeated and showed acute subcentimeter infarcts bilateral cerebella and right cervicomedullary junction as well as slow flow v/s occluded R-VA.  Neurology consulted for input and recommended DAPT with echo/TEE to rule out cardioembolic stroke. Therapy evaluations completed today and patient limited due to pain and numbness LLE>RLE. Thomas [name] recommended due to functional deficits.    Review of Systems  Constitutional: Negative for chills and fever.  HENT: Negative for hearing loss and tinnitus.   Eyes: Negative for blurred vision and double vision.  Respiratory: Negative for cough.   Cardiovascular: Negative for chest pain and palpitations.  Gastrointestinal: Positive for abdominal pain (from umbilicus to groin). Negative for heartburn and nausea.  Genitourinary: Negative for dysuria.  Musculoskeletal: Negative for back pain and myalgias.  Neurological: Positive for dizziness (with head movements), sensory change (left calf numbness and decresed sensation RLE) and focal weakness.  Psychiatric/Behavioral: Negative for depression. The patient is not nervous/anxious.           Past Medical History:  Diagnosis Date  . Hypertension   . Stroke [name])       Past Surgical History:  Procedure Laterality Date  . ENDOVASCULAR STENT INSERTION N/A 07/01/2017   Procedure: ENDOVASCULAR STENT GRAFT INSERTION;  Surgeon: Thomas Mitchell, MD;  Location: [name];  Service: Vascular;  Laterality: N/A;  . THROMBECTOMY FEMORAL ARTERY Bilateral 07/01/2017   Procedure: Bilateral IlioFemoral Thrombectomies;  Surgeon: Thomas Mitchell, MD;  Location: [name];  Service: Vascular;  Laterality: Bilateral;    No family history on file.    Social History:  Lives with cousinand his wife. 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 His smoking use included cigarettes. He has never used smokeless tobacco. He reports that he does not drink alcohol or use drugs.   Allergies: No Known Allergies          Medications Prior to Admission  Medication Sig Dispense Refill  . acetaminophen (TYLENOL) 500 MG tablet Take 500 mg by mouth every 6 (six) hours as needed (for pain or headaches).    Marland Kitchen aspirin EC 81 MG EC tablet Take 1 tablet (81 mg total) by mouth daily. 30 tablet 0  . docusate sodium (COLACE) 100 MG capsule Take 100 mg by mouth daily as needed for mild constipation.      Home: Home Living Family/patient expects to be discharged to:: Private residence Living Arrangements: Other relatives(cousin ) Available Help at Discharge: Family, Friend(s), Available PRN/intermittently Type of Home: House Home Access: Stairs to enter Technical brewer of Steps: 3 Entrance Stairs-Rails: None Home Layout: One level Thomas [name] - single point Additional Comments: also has local dtr that checks on him, 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, sometimes other hours  Functional History: Prior Function Level of Independence: Independent Comments: works in [employer] [place]:  Mobility: Bed Mobility Overal bed mobility: Needs  Assistance Bed Mobility: Supine to Sit Supine to sit: Supervision General bed mobility comments: for safety and line management Transfers Overall transfer level: Needs assistance Equipment used: Rolling walker (2 wheeled) Transfers: Sit to/from Stand Sit to Stand: Min assist General transfer comment: min assist for anterior-superior wt shift and to stabilize once in standing with RW support Ambulation/Gait Ambulation/Gait assistance: Min assist, Mod assist Ambulation Distance (Feet): 12 Feet(in room) Assistive device: Rolling walker (2 wheeled) Gait Pattern/deviations: Step-through pattern, Decreased stride length, Narrow base of support General Gait Details: assist for balance and to maneuver RW in room, multi-modal cues for step length, RW position, heavy use of UEs  ADL:  Cognition: Cognition Overall Cognitive Status: Within Functional Limits for tasks assessed Orientation Level: Oriented X4 Cognition Arousal/Alertness: Awake/alert Behavior During Therapy: WFL for tasks assessed/performed Overall Cognitive Status: Within Functional Limits for tasks assessed  Blood pressure 134/79, pulse 75, temperature 97.9 F (36.6 C), temperature source Oral, resp. rate 19, height 5\' 7"  (1.702 m), weight 56.7 kg (125 lb), SpO2 99 %. Physical Exam  Nursing note and vitals reviewed. Constitutional: He is oriented to person, place, and time. He appears well-developed and well-nourished. No distress.  HENT:  Head: Normocephalic and atraumatic.  Eyes: Pupils are equal, round, and reactive to light. Conjunctivae and EOM are normal.  Cardiovascular: Normal rate, regular rhythm and normal heart sounds.  No murmur heard. Respiratory: Effort normal and breath sounds normal. No respiratory distress. He has no wheezes.  GI: Soft. Bowel sounds are normal. He exhibits no distension. There is no tenderness.  Musculoskeletal: Normal range of motion.  Neurological: He is alert and oriented to person,  place, and time. He displays no tremor. Gait abnormal. Coordination normal.  Strength is 5/5 bilateral deltoid bicep tricep grip 3- bilateral hip flexor 4 at bilateral knee extensor 4 bilateral ankle dorsiflexor No evidence of dysmetria bilateral finger-nose-finger testing  Skin: He is not diaphoretic.  Psychiatric: He has a normal mood and affect.  Assessment/Plan: Diagnosis: Paraparesis lower extremity sensory changes and gait disorder likely had spinal cord ischemia related to AAA rupture, also has had a small cerebellar infarcts 1. Does the need for close, 24 hr/day medical supervision in concert with the patient's rehab needs make it unreasonable for this patient to be served in a less intensive setting? Yes 2. Co-Morbidities requiring supervision/potential complications: Peripheral vascular disease, 3. Due to bladder management, bowel management, safety, skin/wound care, disease management, medication administration, pain management and patient education, does the patient require 24 hr/day rehab nursing? Yes 4. Does the patient require coordinated care of a physician, rehab nurse, PT (1-2 hrs/day, 5 days/week) and OT (1-2 hrs/day, 5 days/week) to address physical and functional deficits in the context of the above medical diagnosis(es)? Yes Addressing deficits in the following areas: balance, endurance, locomotion, strength, transferring, bowel/bladder control, bathing, dressing, feeding, grooming, toileting and psychosocial support 5. Can the patient actively participate in an intensive therapy program of at least 3 hrs of therapy per day at least 5 days per week? Yes 6. The potential for patient to make measurable gains while on inpatient rehab is excellent 7. Anticipated functional outcomes upon discharge from inpatient rehab are modified independent  with PT, modified independent with OT, n/a with SLP. 8. Estimated rehab length of stay to reach the above functional goals is: 7 to 10  days 9.  Anticipated D/C setting: Home 10. Anticipated post D/C treatments: [facility] therapy 11. Overall Rehab/Functional Prognosis: excellent  RECOMMENDATIONS: This patient's condition is appropriate for continued rehabilitative care in the following setting: [name] Patient has agreed to participate in recommended program. Yes Note that insurance prior authorization may be required for reimbursement for recommended care.  Comment: Patient's daughter was staying with him after his last surgery she can help him at home upon discharge as well   "I have personally performed a face to face diagnostic evaluation of this patient.  Additionally, I have reviewed and concur with the physician assistant's documentation above." Charlett Blake M.D. [facility] FAAPM&R (Sports Med, Neuromuscular Med) Diplomate Am Board of [place], PA-C 08/16/2017          Revision History                   Routing History

## 2017-08-19 NOTE — Progress Notes (Signed)
Poor night sleep. 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 Declined trazodone last night, but agreed to try it tonight. Thomas [name] A

## 2017-08-19 NOTE — Progress Notes (Signed)
Occupational Therapy Session Note  Patient Details  Name: Thomas [name] MRN: [id number] Date of Birth: 10-22-1955  Today's Date: 08/19/2017 OT Individual Time:  -   1455- 1600  (65 min)      Short Term Goals: Week 1:  OT Short Term Goal 1 (Week 1): STG= LTG  Skilled Therapeutic Interventions/Progress Updates:  OT to address the following in future treatment sessions:  Balance/vestibular training;Community reintegration;Discharge planning;DME/adaptive equipment instruction;Functional mobility training;Neuromuscular re-education;Pain management;Patient/family education;Self Care/advanced ADL retraining;Therapeutic Activities;Therapeutic Exercise;UE/LE Strength training/ROM;UE/LE Coordination activities;Visual/perceptual remediation/compensation .  Pt arrived back from MRI.  He completed his lunch with no assist.  Agreed to ambulate to gym.  Used 2WW and min assist from room to CVA gym with 2-3 stepping responses for balance.  Pt tends to list to the right and complains of pulling on right side.  Provided pt opportunities for rest breaks and cues to use heel toe stride.  Pt ambulated to bathroom and performed sit to stand with cues for hand placement.  He has not DME and may need a shower bench or seat for Discharge.  He plans to retire and has prn supervision at home through his daughter and cousin.  Pt ambulated back to room and left on EOB with daughter and work Librarian, academic present     Therapy Documentation Precautions:  Precautions Precautions: Fall Restrictions Weight Bearing Restrictions: No    Vital Signs: Therapy Vitals Temp: 97.8 F (36.6 C) Pulse Rate: 77 Resp: 16 BP: 132/81 Patient Position (if appropriate): Lying Oxygen Therapy SpO2: 100 % O2 Device: Room Air Pain:  None reported      Vision Patient Visual Report: Blurring of vision;Other (comment)(missing things on the left) Vision Assessment?: Yes Eye Alignment: Within Functional Limits Ocular Range of Motion: Within  Functional Limits Alignment/Gaze Preference: Within Defined Limits Tracking/Visual Pursuits: Decreased smoothness of horizontal tracking;Impaired - to be further tested in functional context Visual Fields: Right visual field deficit(right lateral field cut ) Diplopia Assessment: (denies) Additional Comments: pt reports his right side of face is numb  Perception  Perception: Within Functional Limits        See Function Navigator for Current Functional Status.   Therapy/Group: Individual Therapy  Lisa Roca 08/19/2017, 4:44 PM

## 2017-08-19 NOTE — Plan of Care (Signed)
  Problem: RH SKIN INTEGRITY Goal: RH STG SKIN FREE OF INFECTION/BREAKDOWN Description Skin to remain free from breakdown while on rehab with supervision from staff.  Outcome: Progressing Goal: RH STG MAINTAIN SKIN INTEGRITY WITH ASSISTANCE Description STG Maintain Skin Integrity With Supervision Assistance.  Outcome: Progressing   Problem: RH SAFETY Goal: RH STG DECREASED RISK OF FALL WITH ASSISTANCE Description STG Decreased Risk of Fall With min Assistance.  Outcome: Progressing   Problem: RH PAIN MANAGEMENT Goal: RH STG PAIN MANAGED AT OR BELOW PT'S PAIN GOAL Description <3 on a 0-10 pain scale  Outcome: Progressing

## 2017-08-19 NOTE — Evaluation (Signed)
Physical Therapy Assessment and Plan  Patient Details  Name: Thomas [name] MRN: [id number] Date of Birth: October 16, 1955  PT Diagnosis: Ataxia, Difficulty walking and Muscle weakness Rehab Potential: Good ELOS: 10-12 days   Today's Date: 08/19/2017 PT Individual Time: 8546-2703 PT Individual Time Calculation (min): 59 min    Problem List:  Patient Active Problem List   Diagnosis Date Noted  . Chronic ischemic vertebrobasilar artery cerebellar stroke 08/18/2017  . Cerebellar artery embolism   . Gait disorder   . Cerebral embolism with cerebral infarction 08/16/2017  . Paraparesis ([place])   . Hypertension 08/15/2017  . Gait instability 08/15/2017  . Pelvic pain 08/15/2017  . Chronic kidney disease 08/15/2017  . Abnormal CT scan, pelvis 08/15/2017  . Meniere disease, left 08/08/2017  . Sepsis due to Streptococcus pneumoniae ([place])   . Acute respiratory failure with hypoxia ([place])   . Hemorrhagic shock ([place]) 07/02/2017  . Acute blood loss anemia 07/02/2017  . S/P AAA repair   . LLQ abdominal pain   . Ruptured abdominal aortic aneurysm (AAA) ([place])   . Hypotension   . Bacteremia due to Streptococcus 06/30/2017  . CVA (cerebral vascular accident) ([place]) 06/30/2017  . Chest pain 06/28/2017  . CAP (community acquired pneumonia): Clinical 06/28/2017  . Acute renal failure superimposed on stage 3 chronic kidney disease ([place]) 06/28/2017  . Dehydration 06/28/2017  . Leukocytosis 06/28/2017  . Elevated troponin 06/28/2017  . Dizziness 06/28/2017  . Vertigo 06/28/2017  . Acute pericarditis: Probable 06/28/2017    Past Medical History:  Past Medical History:  Diagnosis Date  . Hypertension   . Stroke [facility])    Past Surgical History:  Past Surgical History:  Procedure Laterality Date  . ENDOVASCULAR STENT INSERTION N/A 07/01/2017   Procedure: ENDOVASCULAR STENT GRAFT INSERTION;  Surgeon: Serafina Mitchell, MD;  Location: [facility] OR;  Service: Vascular;  Laterality: N/A;  . THROMBECTOMY FEMORAL  ARTERY Bilateral 07/01/2017   Procedure: Bilateral IlioFemoral Thrombectomies;  Surgeon: Serafina Mitchell, MD;  Location: [facility] OR;  Service: Vascular;  Laterality: Bilateral;    Assessment & Plan Clinical Impression: Patient is a 62 y.o. year old male with history of CKD,ruptured aneurysm status post bilateral iliofemoral endarterectomy with thromboembolectomy 500 complicated by 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 He completed his IV antibiotic close on 519 however was admitted on 5 /22/19with 3-day history of bilateral lower extremity weakness with abdominal and bilateral groin pain. CT abdomen pelvis done showed no evidence of leak and intense mural enhancement felt to be due to postop changes per Dr. Gwenlyn Saran. MRI of brain was repeated and showed acute subcentimeter infarcts bilateral cerebellar and right cervical medullary junction as well as slow flow versus occluded right-VA. Neurology was consulted for input and recommended DAPT with ECHO/TEE to rule out cardioembolic source. 2D echo done revealing EF 65-70% with moderate AVR.CTA chest 5/24 revealing 4.5 cm thoracic aortic aneurysm with soft plaque and minimal ulcerative plaque and bilateral centrilobar/paraseptal emphysema. Dr. Erlinda Hong recommended lovenox bridge with coumadin and low dose ASA for extensive soft plaque likely cause of stroke. To repeat CTA aorta in 3 months to decided on further course of anticoagulation or antiplatelet.Therapy evaluationsdone revealing functional deficits and CIR recommended for follow up therapy.  Patient transferred to CIR on 08/18/2017 .   Patient currently requires min with mobility secondary to muscle weakness, ataxia and decreased coordination, dizziness and decreased standing balance  and decreased balance strategies.  Prior to hospitalization, patient  was independent  with mobility and lived with Family in a House home.  Home access is 3Stairs to enter.  Patient will benefit from skilled PT intervention to maximize safe functional mobility, minimize fall risk and decrease caregiver burden for planned discharge intermittent supervision.  Anticipate patient will benefit from follow up [place] at discharge.  PT - End of Session Activity Tolerance: Tolerates 30+ min activity with multiple rests Endurance Deficit: Yes PT Assessment Rehab Potential (ACUTE/IP ONLY): Good PT Barriers to Discharge: Decreased caregiver support;Inaccessible home environment PT Patient demonstrates impairments in the following area(s): Balance;Safety;Behavior;Endurance;Motor;Pain;Skin Integrity;Sensory PT Transfers Functional Problem(s): Bed Mobility;Bed to Chair;Car;Furniture;Floor PT Locomotion Functional Problem(s): Ambulation;Wheelchair Mobility;Stairs PT Plan PT Intensity: Minimum of 1-2 x/day ,45 to 90 minutes PT Frequency: 5 out of 7 days PT Duration Estimated Length of Stay: 10-12 days PT Treatment/Interventions: Ambulation/gait training;Community reintegration;DME/adaptive equipment instruction;Neuromuscular re-education;Stair training;UE/LE Strength taining/ROM;Wheelchair propulsion/positioning;Balance/vestibular training;Discharge planning;Pain management;Skin care/wound management;Therapeutic Activities;Cognitive remediation/compensation;Disease management/prevention;Functional mobility training;Patient/family education;Splinting/orthotics;Therapeutic Exercise;Visual/perceptual remediation/compensation;UE/LE Coordination activities PT Transfers Anticipated Outcome(s): Mod I PT Locomotion Anticipated Outcome(s): supervision  PT Recommendation Follow Up Recommendations: Home health PT Patient destination: Home Equipment Recommended: To be determined  Skilled Therapeutic Intervention Evaluation completed (see details above and below) with education on PT POC and  goals and individual treatment initiated with focus on ambulation, functional transfers and safety. Pt supine in bed upon PT arrival, agreeable to therapy tx and reports abdominal pain 3/10. Pt transferred to sitting with supervision. Pt performed sit<>stand with RW and min assist, stand pivot from bed>w/c with min assist and RW. Pt propelled w/c from room>gym with supervision and verbal cues for techniques. Car transfer min assist stand pivot. Pt ambulated x 150 ft with RW and min assist, scissoring and ataxic LE movements. Pt ascended/descended 8 steps with B handrails and min assist, reports dizziness after completing 8 steps. Pt reports that "things moving up and down" and denies light headedness. Pt reports he had previous history of dizziness with nausea 4 years ago and went to the ED, he was told "there was nothing they could do." Pt educated that this dizziness could relate to the area of his stroke in the cerebellum, educated on X1 vestibular exercises. BP 128/98 in standing. Pt transported back to room and transferred to bed, left supine in care of RN.   PT Evaluation Precautions/Restrictions Precautions Precautions: Fall Restrictions Weight Bearing Restrictions: No General   Vital Signs Pain Pain Assessment Pain Scale: 0-10 Pain Score: 0-No pain Home Living/Prior Functioning Home Living Available Help at Discharge: Family;Friend(s);Available PRN/intermittently Type of Home: House Home Access: Stairs to enter [employer] of Steps: 3 Entrance Stairs-Rails: None Home Layout: One level Bathroom Shower/Tub: Chiropodist: Standard Additional Comments: also has local daughter that checks on him, pt states she works 700 to 1600, sometimes other hours  Lives With: Family Prior Function  Able to Take Stairs?: Yes Driving: Yes Vocation: Full time employment Comments: works in Land at [employer] Alignment:  Within Passenger transport manager Range of Motion: Within Functional Limits Alignment/Gaze Preference: Within Defined Limits Tracking/Visual Pursuits: Decreased smoothness of horizontal tracking;Impaired - to be further tested in functional context Diplopia Assessment: (denies)  Cognition Overall Cognitive Status: Within Functional Limits for tasks assessed Arousal/Alertness: Awake/alert Orientation Level: Oriented X4 Sensation Sensation Light Touch: Impaired by gross assessment Proprioception: Appears Intact Additional Comments: R LE numbness and tingling below knee Coordination Gross Motor Movements are Fluid and  Coordinated: No Fine Motor Movements are Fluid and Coordinated: No Coordination and Movement Description: mild ataxia and scissoring  Motor  Motor Motor: Ataxia Motor - Skilled Clinical Observations: B LE weakness proximal>distal   Trunk/Postural Assessment  Cervical Assessment Cervical Assessment: Within Functional Limits Thoracic Assessment Thoracic Assessment: Within Functional Limits Lumbar Assessment Lumbar Assessment: Within Functional Limits Postural Control Postural Control: Deficits on evaluation Protective Responses: impaired  Balance Balance Balance Assessed: Yes Static Sitting Balance Static Sitting - Level of Assistance: 5: Stand by assistance Dynamic Sitting Balance Dynamic Sitting - Level of Assistance: 5: Stand by assistance Static Standing Balance Static Standing - Level of Assistance: 4: Min assist Dynamic Standing Balance Dynamic Standing - Level of Assistance: 3: Mod assist(mod assist without AD) Extremity Assessment  RLE Assessment RLE Assessment: Exceptions to WFL(hip flexors 3+/5 otherwise 4/5 throughout) LLE Assessment LLE Assessment: Exceptions to WFL(hip flexors 3+/5 otherwise 4/5 throughou)   See Function Navigator for Current Functional Status.   Refer to Care Plan for Long Term Goals  Recommendations for other services: None    Discharge Criteria: Patient will be discharged from PT if patient refuses treatment 3 consecutive times without medical reason, if treatment goals not met, if there is a change in medical status, if patient makes no progress towards goals or if patient is discharged from hospital.  The above assessment, treatment plan, treatment alternatives and goals were discussed and mutually agreed upon: by patient  Netta Corrigan, PT, DPT 08/19/2017, 11:13 AM

## 2017-08-19 NOTE — Progress Notes (Addendum)
Subjective/Complaints: Feels weak in both legs right side more so than left side Review of systems denies chest pain shortness of breath nausea vomiting diarrhea or constipation Objective: Vital Signs: Blood pressure (!) 147/84, pulse 86, temperature 98.7 F (37.1 C), temperature source Oral, resp. rate 20, height '5\' 7"'$  (1.702 m), weight 53.8 kg (118 lb 9.7 oz), SpO2 94 %. Ct Angio Chest Aorta W/cm &/or Wo/cm  Result Date: 08/17/2017 CLINICAL DATA:  Nontraumatic aortic disease. EXAM: CT ANGIOGRAPHY CHEST WITH CONTRAST TECHNIQUE: Multidetector CT imaging of the chest was performed using the standard protocol during bolus administration of intravenous contrast. Multiplanar CT image reconstructions and MIPs were obtained to evaluate the vascular anatomy. CONTRAST:  <See Chart> ISOVUE-370 IOPAMIDOL (ISOVUE-370) INJECTION 76% COMPARISON:  CT abdomen pelvis 07/08/2017 FINDINGS: Cardiovascular: 4.5 cm ascending thoracic aortic aneurysm with soft plaque along the ascending thoracic aorta, arch and to a lesser degree the descending thoracic aorta. Minimal ulcerated plaque posteriorly within the ascending thoracic aorta, series 5/71. No dissection is visualized. Conventional branch pattern of the great vessels atherosclerosis. No significant stenosis. Left vertebral arterial dominance noted. Heart is top-normal without pericardial effusion or thickening. Mediastinum/Nodes: Normal thyroid without mass. Patent midline trachea and mainstem bronchi. Mild esophageal thickening proximally query esophagitis. No large central pulmonary embolus. Lungs/Pleura: Bilateral centrilobular and paraseptal emphysema. No dominant mass. Dependent bibasilar atelectasis and mild pleural thickening along the left major fissure. Upper Abdomen: Nonspecific 1.6 x 1 cm hyperdense nodule of the left adrenal gland. Simple cyst noted off the upper pole of the right kidney measuring 2 cm. An interpolar left renal cyst is also noted too small to  further characterize measuring approximately 7 mm. Numerous gallstones are present within the gallbladder. Remainder of the abdomen is unremarkable. Musculoskeletal: No chest wall abnormality. No acute or significant osseous findings. Review of the MIP images confirms the above findings. IMPRESSION: 1. 4.5 cm ascending thoracic aortic aneurysm with soft plaque as above described, some which is minimally ulcerated along the posterior aspect of the distal ascending aorta. No dissection. Recommend semi-annual imaging followup by CTA or MRA and referral to cardiothoracic surgery if not already obtained. This recommendation follows 2010 ACCF/AHA/AATS/ACR/ASA/SCA/SCAI/SIR/STS/SVM Guidelines for the Diagnosis and Management of Patients With Thoracic Aortic Disease. Circulation. 2010; 121: A193-X902 2. Mild cardiomegaly without pericardial effusion or thickening. 3. No acute pulmonary embolus. 4. Bilateral centrilobular and paraseptal emphysema with dependent bibasilar atelectasis. 5. Indeterminate left adrenal nodule measuring 1.6 x 1 cm. This could be further correlated with dedicated CT or MRI. 6. Uncomplicated cholelithiasis. 7. Bilateral renal cysts. Aortic aneurysm NOS (ICD10-I71.9). Aortic Atherosclerosis (ICD10-I70.0) and Emphysema (ICD10-J43.9). Electronically Signed   By: Ashley Royalty M.D.   On: 08/17/2017 14:51   Results for orders placed or performed during the hospital encounter of 08/18/17 (from the past 72 hour(s))  CBC WITH DIFFERENTIAL     Status: Abnormal   Collection Time: 08/19/17  4:48 AM  Result Value Ref Range   WBC 10.8 (H) 4.0 - 10.5 K/uL   RBC 4.88 4.22 - 5.81 MIL/uL   Hemoglobin 12.3 (L) 13.0 - 17.0 g/dL   HCT 37.7 (L) 39.0 - 52.0 %   MCV 77.3 (L) 78.0 - 100.0 fL   MCH 25.2 (L) 26.0 - 34.0 pg   MCHC 32.6 30.0 - 36.0 g/dL   RDW 15.8 (H) 11.5 - 15.5 %   Platelets 364 150 - 400 K/uL   Neutrophils Relative % 39 %   Neutro Abs 4.2 1.7 - 7.7 K/uL   Lymphocytes  Relative 38 %   Lymphs Abs  4.1 (H) 0.7 - 4.0 K/uL   Monocytes Relative 5 %   Monocytes Absolute 0.6 0.1 - 1.0 K/uL   Eosinophils Relative 17 %   Eosinophils Absolute 1.9 (H) 0.0 - 0.7 K/uL   Basophils Relative 1 %   Basophils Absolute 0.1 0.0 - 0.1 K/uL   Immature Granulocytes 0 %   Abs Immature Granulocytes 0.0 0.0 - 0.1 K/uL    Comment: Performed at [facility] [address] [place], [place] [address]  Comprehensive metabolic panel     Status: Abnormal   Collection Time: 08/19/17  4:48 AM  Result Value Ref Range   Sodium 140 135 - 145 mmol/L   Potassium 3.6 3.5 - 5.1 mmol/L   Chloride 108 101 - 111 mmol/L   CO2 26 22 - 32 mmol/L   Glucose, Bld 91 65 - 99 mg/dL   BUN 13 6 - 20 mg/dL   Creatinine, Ser 1.30 (H) 0.61 - 1.24 mg/dL   Calcium 9.0 8.9 - 10.3 mg/dL   Total Protein 7.7 6.5 - 8.1 g/dL   Albumin 3.0 (L) 3.5 - 5.0 g/dL   AST 20 15 - 41 U/L   ALT 16 (L) 17 - 63 U/L   Alkaline Phosphatase 92 38 - 126 U/L   Total Bilirubin 0.6 0.3 - 1.2 mg/dL   GFR calc non Af Amer 57 (L) >60 mL/min   GFR calc Af Amer >60 >60 mL/min    Comment: (NOTE) The eGFR has been calculated using the CKD EPI equation. This calculation has not been validated in all clinical situations. eGFR's persistently <60 mL/min signify possible Chronic Kidney Disease.    Anion gap 6 5 - 15    Comment: Performed at [facility] [address], [place], [place] [address]  Protime-INR     Status: None   Collection Time: 08/19/17  4:48 AM  Result Value Ref Range   Prothrombin Time 14.0 11.4 - 15.2 seconds   INR 1.09     Comment: Performed at [facility] [address], [place], [place] [address]     HEENT: Right facial numbness no droop Cardio: RRR and no murmur Resp: CTA B/L and unlabored GI: BS positive and NT, ND Extremity:  No Edema Skin:   Intact Neuro: Alert/Oriented, Abnormal Sensory has Less  LT sensation in thoracic paraspinal than lumbar, reduced LT bilateral lower legs but not feet, Abnormal Motor  5/5 in B Delt, bi , tri grip , 4- R HF, 4 L HF 4- R KE, 4 Left 4/5 B ankle DF and Abnormal FMC Ataxic/ dec FMC Musc/Skel:  Other no pain with UE or LE ROM Gen NAD   Assessment/Plan: 1. Functional deficits secondary to paraparesis, left upper ext ataxia, gait disorder CVA +/- spinal infarct which require 3+ hours per day of interdisciplinary therapy in a comprehensive inpatient rehab setting. Physiatrist is providing close team supervision and 24 hour management of active medical problems listed below. Physiatrist and rehab team continue to assess barriers to discharge/monitor patient progress toward functional and medical goals. FIM:       Function - Toileting Toileting steps completed by patient: Performs perineal hygiene Toileting steps completed by helper: Adjust clothing prior to toileting, Adjust clothing after toileting Toileting Assistive Devices: Grab bar or rail Assist level: Touching or steadying assistance (Pt.75%)  Function - Toilet Transfers Toilet transfer assistive device: Grab bar Assist level to toilet: Touching or steadying  assistance (Pt > 75%) Assist level from toilet: Touching or steadying assistance (Pt > 75%)        Function - Comprehension Comprehension: Auditory Comprehension assist level: Understands complex 90% of the time/cues 10% of the time  Function - Expression Expression: Verbal Expression assist level: Expresses basic needs/ideas: With extra time/assistive device  Function - Social Interaction Social Interaction assist level: Interacts appropriately 90% of the time - Needs monitoring or encouragement for participation or interaction.  Function - Problem Solving Problem solving assist level: Solves basic problems with no assist  Function - Memory Memory assist level: Recognizes or recalls 90% of the time/requires cueing < 10% of the time  Medical Problem List and Plan: 1. Paraparesis lower extremity sensory changes and gait disorder  likely had spinal cord ischemia related to AAA rupture and Aortic arch plaque and uceration, also has had a small cerebellar infarcts  CHeck lumbar MRI to look for lower spinal infarct, would be helpful to include lower thoracic  Initial rehab evals today  2. DVT Prophylaxis/Anticoagulation: Pharmaceutical:Coumadin and Lovenoxwith low dose ASA 3. Pain Management:tylenol prn. Will add low dose gabapentin to help with dysesthesias. 4. Mood:LCSW to follow for evaluation and support. 5. Neuropsych: This patientiscapable of making decisions on hisown behalf. 6. Skin/Wound Care:Monitor wound for healing. Maintain adequate nutritional and hydration Status.  7. Fluids/Electrolytes/Nutrition:Monitor I/O-- check lytes in am.  8. HTN: Monitor BP bid, controlled off meds  9. Ruptured aneurysm s/p repair with subsequent Endarterectomy: Monitor BP bid. Controlled at this time 10. CKD: Monitor for stability. Avoid nephrotoxic medication. Baseline SCr 1.3- at baseline 11. Dyslipidemia: on Lipitor 12. Leucocytosis: Monitor for signs of infection. Completed antibiotic regimen on 08/12/17    LOS (Days) 1 A FACE TO FACE EVALUATION WAS PERFORMED  Charlett Blake 08/19/2017, 10:14 AM

## 2017-08-20 ENCOUNTER — Inpatient Hospital Stay (HOSPITAL_COMMUNITY): Payer: Private Health Insurance - Indemnity

## 2017-08-20 ENCOUNTER — Inpatient Hospital Stay (HOSPITAL_COMMUNITY): Payer: Private Health Insurance - Indemnity | Admitting: Physical Therapy

## 2017-08-20 ENCOUNTER — Inpatient Hospital Stay (HOSPITAL_COMMUNITY): Payer: Private Health Insurance - Indemnity | Admitting: Occupational Therapy

## 2017-08-20 LAB — CBC
HEMATOCRIT: 35.3 % — AB (ref 39.0–52.0)
Hemoglobin: 11.5 g/dL — ABNORMAL LOW (ref 13.0–17.0)
MCH: 25.7 pg — ABNORMAL LOW (ref 26.0–34.0)
MCHC: 32.6 g/dL (ref 30.0–36.0)
MCV: 78.8 fL (ref 78.0–100.0)
PLATELETS: 344 10*3/uL (ref 150–400)
RBC: 4.48 MIL/uL (ref 4.22–5.81)
RDW: 16 % — AB (ref 11.5–15.5)
WBC: 10.2 10*3/uL (ref 4.0–10.5)

## 2017-08-20 LAB — PROTIME-INR
INR: 1.32
PROTHROMBIN TIME: 16.3 s — AB (ref 11.4–15.2)

## 2017-08-20 LAB — CULTURE, BLOOD (ROUTINE X 2)
Culture: NO GROWTH
Culture: NO GROWTH
SPECIAL REQUESTS: ADEQUATE

## 2017-08-20 MED ORDER — WARFARIN SODIUM 5 MG PO TABS
5.0000 mg | ORAL_TABLET | Freq: Once | ORAL | Status: AC
  Administered 2017-08-20: 5 mg via ORAL
  Filled 2017-08-20: qty 1

## 2017-08-20 NOTE — Progress Notes (Signed)
Occupational Therapy Session Note  Patient Details  Name: Thomas [name] MRN: [id number] Date of Birth: May 08, 1955  Today's Date: 08/20/2017 OT Individual Time: 1334-1500 OT Individual Time Calculation (min): 86 min    Short Term Goals: Week 1:  OT Short Term Goal 1 (Week 1): Pt will perform bathing at shower level with supervision for safety and balance.   OT Short Term Goal 2 (Week 1): Pt will attend to right side of body and environment in functional tasks with 1 verbal cue.   OT Short Term Goal 3 (Week 1): Pt will utilize correct hand placement with no verbal cues.    Skilled Therapeutic Interventions/Progress Updates:    Pt seen for OT ADL bathing/dressing session and therapeutic activities in gym focusing on LE strengthening and functional ambulation. Pt in supine upon arrival, daughter present, and pt agreeable to tx session and denying pain. Pt agreeable to bathing at shower level. He ambulated throughout room with min A using RW, required frequent cuing for RW management in functional context and safety awareness. Pt impulsive with mobility and with decreased awareness of deficits/ safety risk. He bathed seated on tub bench, stood with use of grab bars to complete pericare/buttock hygiene with guarding assist.  He dressed seated on toilet, cuing for safety to use RW during standing LB clothing management.  He returned out to room and completed grooming tasks standing at sink. Required seated rest break following grooming task, pt voicing decreased functional activity tolerance. Education provided regarding energy conservation and time for endurance to improve.  In therapy gym, pt completed obstacle course, required to weave through cones while managing RW. Pt with R drift with ambulation. Once made aware and with cues to slow down, pt able to self correct on subsequent two trials.  He returned to supine on therapy mat. Completed LE strengthening exercises x3 sets of 10 bridging and x10 hip  ABduction "clam shells". Demonstration provided for proper form and technique with VCs for deep breathing during exercises. x2 sets of 10 glute squeezes with ball placed btwn knees. Pt ambulated from therapy gym to room, 1 seated rest break provided. Overall min A ambulation. Pt requesting return to supine at end of session, left with all needs in reach and bed alarm on. Throughout session, education provided regarding energy conservation, fall risk, deep breathing techniques, importance of quality vs quantity with exercises, and d/c planning.   Therapy Documentation Precautions:  Precautions Precautions: Fall Restrictions Weight Bearing Restrictions: No Pain:   No/denies pain  See Function Navigator for Current Functional Status.   Therapy/Group: Individual Therapy  Katoria Yetman L 08/20/2017, 7:25 AM

## 2017-08-20 NOTE — Evaluation (Signed)
Speech Language Pathology Assessment and Plan  Patient Details  Name: Thomas [name] MRN: [id number] Date of Birth: [birth year]  SLP Diagnosis: Cognitive Impairments  Rehab Potential: Good ELOS: 7-10 days     Today's Date: 08/20/2017 SLP Individual Time: 0900-1000 SLP Individual Time Calculation (min): 60 min   Problem List:  Patient Active Problem List   Diagnosis Date Noted  . Chronic ischemic vertebrobasilar artery cerebellar stroke 08/18/2017  . Cerebellar artery embolism   . Gait disorder   . Cerebral embolism with cerebral infarction 08/16/2017  . Paraparesis ([place])   . Hypertension 08/15/2017  . Gait instability 08/15/2017  . Pelvic pain 08/15/2017  . Chronic kidney disease 08/15/2017  . Abnormal CT scan, pelvis 08/15/2017  . Meniere disease, left 08/08/2017  . Sepsis due to Streptococcus pneumoniae ([place])   . Acute respiratory failure with hypoxia ([place])   . Hemorrhagic shock ([place]) 07/02/2017  . Acute blood loss anemia 07/02/2017  . S/P AAA repair   . LLQ abdominal pain   . Ruptured abdominal aortic aneurysm (AAA) ([place])   . Hypotension   . Bacteremia due to Streptococcus 06/30/2017  . CVA (cerebral vascular accident) ([place]) 06/30/2017  . Chest pain 06/28/2017  . CAP (community acquired pneumonia): Clinical 06/28/2017  . Acute renal failure superimposed on stage 3 chronic kidney disease ([place]) 06/28/2017  . Dehydration 06/28/2017  . Leukocytosis 06/28/2017  . Elevated troponin 06/28/2017  . Dizziness 06/28/2017  . Vertigo 06/28/2017  . Acute pericarditis: Probable 06/28/2017   Past Medical History:  Past Medical History:  Diagnosis Date  . Hypertension   . Stroke [facility])    Past Surgical History:  Past Surgical History:  Procedure Laterality Date  . ENDOVASCULAR STENT INSERTION N/A 07/01/2017   Procedure: ENDOVASCULAR STENT GRAFT INSERTION;  Surgeon: Serafina Mitchell, MD;  Location: [facility] OR;  Service: Vascular;  Laterality: N/A;  . THROMBECTOMY FEMORAL ARTERY  Bilateral 07/01/2017   Procedure: Bilateral IlioFemoral Thrombectomies;  Surgeon: Serafina Mitchell, MD;  Location: [facility] OR;  Service: Vascular;  Laterality: Bilateral;    Assessment / Plan / Recommendation Clinical Impression Thomas [name] is a 62 year old male with history of CKD, ruptured aneurysm status post bilateral iliofemoral endarterectomy with thromboembolectomy 0/63 complicated by 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  He completed his IV antibiotic; however was admitted on 08/15/17 with 3-day history of bilateral lower extremity weakness with abdominal and bilateral groin pain.  CT abdomen pelvis done showed no evidence of leak and intense mural enhancement felt to be due to postop changes per Dr. Gwenlyn Saran.  MRI of brain was repeated and showed acute subcentimeter infarcts bilateral cerebellar and right cervical medullary junction as well as slow flow versus occluded right-VA.  Neurology was consulted for input and recommended DAPT with ECHO/TEE to rule out cardioembolic source. 2D echo done revealing EF 65-70% with moderate AVR.  CTA chest pending.  Therapy evaluations done revealing functional deficits and CIR recommended for follow up therapy.  Discussed with acute attending and neurology team and anticipate that work up will be completed later today, 08/17/17 and that patient will be medically ready for IP Rehab admission tomorrow, Saturday 08/18/17.   Pt presents with mild impairment in high level cognitive linguistic skills, impacting semi-complex problem solving, error awareness/correction and anticipatory awareness, which was further supported by mild impairment in executive function on CLQT. English is a second language for pt  and therefore language difference were noted but not considered a deficit. Pt would benefit from skilled ST services in  order to maximum functional independence and reduce burden of care to reach piror independence.    Skilled Therapeutic Interventions          Skilled ST services focused on cognitive skills. SLP facilitated semi-complex problem solving and error awareness and correction in clock drawing task and maze task, requiring supervision A verbal/question cues. Pt agreed to skilled St services, however does not believe his thinking skills have changed and is functional/ SLP will focus on functional task like medication and  Money management. Pt was left in room with call bell within reach. Recommend to continue skilled ST services.    SLP Assessment  Patient will need skilled Speech Lanaguage Pathology Services during CIR admission    Recommendations  SLP Diet Recommendations: Thin Liquid Administration via: Straw;Cup Medication Administration: Whole meds with liquid Supervision: Patient able to self feed Postural Changes and/or Swallow Maneuvers: Seated upright 90 degrees Oral Care Recommendations: Oral care BID Patient destination: Home Follow up Recommendations: None Equipment Recommended: None recommended by SLP    SLP Frequency 3 to 5 out of 7 days   SLP Duration  SLP Intensity  SLP Treatment/Interventions 7-10 days   Minumum of 1-2 x/day, 30 to 90 minutes  Cognitive remediation/compensation;Cueing hierarchy;Functional tasks    Pain Pain Assessment Pain Score: 0-No pain  Prior Functioning Cognitive/Linguistic Baseline: Within functional limits Type of Home: House  Lives With: Family(cousin) Available Help at Discharge: Family;Friend(s);Available PRN/intermittently Vocation: Full time employment  Function:  Eating Eating                 Cognition Comprehension Comprehension assist level: Understands complex 90% of the time/cues 10% of the time  Expression   Expression assist level: Expresses basic needs/ideas: With extra time/assistive device  Social Interaction  Social Interaction assist level: Interacts appropriately 90% of the time - Needs monitoring or encouragement for participation or interaction.  Problem Solving Problem solving assist level: Solves complex 90% of the time/cues < 10% of the time  Memory Memory assist level: More than reasonable amount of time;Recognizes or recalls 90% of the time/requires cueing < 10% of the time   Short Term Goals: Week 1: SLP Short Term Goal 1 (Week 1): Pt will anticipate barriers to discharge and generate appropriate soultions at Mod I.  SLP Short Term Goal 2 (Week 1): Pt will complete semi-complex tasks at Mod I for functional problem solving.  SLP Short Term Goal 3 (Week 1): Pt will self-monitor and correct functional errors in semi-complex tasks at Mod I.   Refer to Care Plan for Long Term Goals  Recommendations for other services: None   Discharge Criteria: Patient will be discharged from SLP if patient refuses treatment 3 consecutive times without medical reason, if treatment goals not met, if there is a change in medical status, if patient makes no progress towards goals or if patient is discharged from hospital.  The above assessment, treatment plan, treatment alternatives and goals were discussed and mutually agreed upon: by patient  [place]  [facility] 08/20/2017, 2:42 PM

## 2017-08-20 NOTE — Progress Notes (Signed)
ANTICOAGULATION CONSULT NOTE - Follow-up Consult  Pharmacy Consult for Enoxaparin and Warfarin Indication: Soft plaque (clot) in ascending arch and descending aorta  No Known Allergies  Patient Measurements: Height: 5\' 7"  (170.2 cm) Weight: 118 lb 9.7 oz (53.8 kg) IBW/kg (Calculated) : 66.1 Heparin Dosing Weight: 56.7 kg  Vital Signs: Temp: 98.4 F (36.9 C) (05/27 0443) Temp Source: Oral (05/27 0443) BP: 164/85 (05/27 0443) Pulse Rate: 79 (05/27 0443)  Labs: Recent Labs    08/18/17 0139 08/19/17 0448 08/20/17 0502  HGB 11.8* 12.3* 11.5*  HCT 36.5* 37.7* 35.3*  PLT 350 364 344  LABPROT 13.6 14.0 16.3*  INR 1.05 1.09 1.32  CREATININE 1.47* 1.30*  --    Estimated Creatinine Clearance: 44.3 mL/min (A) (by C-G formula based on SCr of 1.3 mg/dL (H)).  Assessment: 30 yoM with clot in aorta, likely source of embolic stroke. Pharmacy consulted to dose warfarin and Lovenox bridge to therapeutic INR. INR increased but remains below goal at 1.32 this AM. CBC stable, no bleeding noted.  Goal of Therapy:  INR 2-3 Monitor platelets by anticoagulation protocol: Yes   Plan:  Continue Lovenox 60mg  SQ q12h Warfarin 5 mg PO x1  Daily INR Monitor s/sx of bleeding  Erin N. Gerarda Fraction, PharmD PGY1 Pharmacy Resident Pager: [phone] 08/20/17   8:04 AM
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
## 2017-08-20 NOTE — Progress Notes (Signed)
Subjective/Complaints: Discussed MRI result  Still has lower abd and groin pain not progressive denies any bladder issue  Review of systems denies chest pain shortness of breath nausea vomiting diarrhea or constipation Objective: Vital Signs: Blood pressure (!) 164/85, pulse 79, temperature 98.4 F (36.9 C), temperature source Oral, resp. rate 18, height '5\' 7"'  (1.702 m), weight 53.8 kg (118 lb 9.7 oz), SpO2 96 %. Mr Lumbar Spine Wo Contrast  Result Date: 08/19/2017 CLINICAL DATA:  Back pain.  Cauda equina syndrome suspected. EXAM: MRI LUMBAR SPINE WITHOUT CONTRAST TECHNIQUE: Multiplanar, multisequence MR imaging of the lumbar spine was performed. No intravenous contrast was administered. COMPARISON:  Multiple prior CT scans of the abdomen and pelvis. FINDINGS: Segmentation:  Transitional L5. Alignment:  Anatomic. Vertebrae: No worrisome osseous lesions. T1 hypointense, T2 hyperintense edema in the L2, L4, L5 and S1 vertebrae have no correlate on CT and are non worrisome. Atypical hemangiomata versus bone infarction not excluded. Conus medullaris and cauda equina: The cord is visualized as far cephalad as mid T10. Conus extends to the T12-L1 level. Conus and cauda equina appear normal. Within limits for detection on lumbar spine MRI, no visible spinal cord infarct. Paraspinal and other soft tissues: Large abdominal aortic aneurysm has been stented. Disc levels: L1-L2:  Normal. L2-L3:  Normal disc space.  Mild facet arthropathy.  No impingement. L3-L4:  Annular bulge.  Facet arthropathy.  No impingement. L4-L5:  Annular bulge.  Facet arthropathy.  No impingement. L5-S1:  Transitional interspace.  No impingement. IMPRESSION: No disc protrusion or spinal stenosis. Lower lumbar facet arthropathy. No compressive lesion is identified. No evidence for cauda equina syndrome. No visible spinal cord infarct, with visualization as far cephalad as mid T10. Electronically Signed   By: Staci Righter M.D.   On:  08/19/2017 15:15   Results for orders placed or performed during the hospital encounter of 08/18/17 (from the past 72 hour(s))  CBC WITH DIFFERENTIAL     Status: Abnormal   Collection Time: 08/19/17  4:48 AM  Result Value Ref Range   WBC 10.8 (H) 4.0 - 10.5 K/uL   RBC 4.88 4.22 - 5.81 MIL/uL   Hemoglobin 12.3 (L) 13.0 - 17.0 g/dL   HCT 37.7 (L) 39.0 - 52.0 %   MCV 77.3 (L) 78.0 - 100.0 fL   MCH 25.2 (L) 26.0 - 34.0 pg   MCHC 32.6 30.0 - 36.0 g/dL   RDW 15.8 (H) 11.5 - 15.5 %   Platelets 364 150 - 400 K/uL   Neutrophils Relative % 39 %   Neutro Abs 4.2 1.7 - 7.7 K/uL   Lymphocytes Relative 38 %   Lymphs Abs 4.1 (H) 0.7 - 4.0 K/uL   Monocytes Relative 5 %   Monocytes Absolute 0.6 0.1 - 1.0 K/uL   Eosinophils Relative 17 %   Eosinophils Absolute 1.9 (H) 0.0 - 0.7 K/uL   Basophils Relative 1 %   Basophils Absolute 0.1 0.0 - 0.1 K/uL   Immature Granulocytes 0 %   Abs Immature Granulocytes 0.0 0.0 - 0.1 K/uL    Comment: Performed at [facility], [address] [place], [place] [address]  Comprehensive metabolic panel     Status: Abnormal   Collection Time: 08/19/17  4:48 AM  Result Value Ref Range   Sodium 140 135 - 145 mmol/L   Potassium 3.6 3.5 - 5.1 mmol/L   Chloride 108 101 - 111 mmol/L   CO2 26 22 - 32 mmol/L   Glucose, Bld 91 65 -  99 mg/dL   BUN 13 6 - 20 mg/dL   Creatinine, Ser 1.30 (H) 0.61 - 1.24 mg/dL   Calcium 9.0 8.9 - 10.3 mg/dL   Total Protein 7.7 6.5 - 8.1 g/dL   Albumin 3.0 (L) 3.5 - 5.0 g/dL   AST 20 15 - 41 U/L   ALT 16 (L) 17 - 63 U/L   Alkaline Phosphatase 92 38 - 126 U/L   Total Bilirubin 0.6 0.3 - 1.2 mg/dL   GFR calc non Af Amer 57 (L) >60 mL/min   GFR calc Af Amer >60 >60 mL/min    Comment: (NOTE) The eGFR has been calculated using the CKD EPI equation. This calculation has not been validated in all clinical situations. eGFR's persistently <60 mL/min signify possible Chronic Kidney Disease.    Anion gap 6 5 - 15    Comment: Performed at  [facility] [address] [place], [place] [address]  Protime-INR     Status: None   Collection Time: 08/19/17  4:48 AM  Result Value Ref Range   Prothrombin Time 14.0 11.4 - 15.2 seconds   INR 1.09     Comment: Performed at [facility] [address] [place], [place] [address]  CBC     Status: Abnormal   Collection Time: 08/20/17  5:02 AM  Result Value Ref Range   WBC 10.2 4.0 - 10.5 K/uL   RBC 4.48 4.22 - 5.81 MIL/uL   Hemoglobin 11.5 (L) 13.0 - 17.0 g/dL   HCT 35.3 (L) 39.0 - 52.0 %   MCV 78.8 78.0 - 100.0 fL   MCH 25.7 (L) 26.0 - 34.0 pg   MCHC 32.6 30.0 - 36.0 g/dL   RDW 16.0 (H) 11.5 - 15.5 %   Platelets 344 150 - 400 K/uL    Comment: Performed at [facility] [address] [place], [place] [address]  Protime-INR     Status: Abnormal   Collection Time: 08/20/17  5:02 AM  Result Value Ref Range   Prothrombin Time 16.3 (H) 11.4 - 15.2 seconds   INR 1.32     Comment: Performed at [facility] [address], [place], [place] [address]     HEENT: Right facial numbness no droop Cardio: RRR and no murmur Resp: CTA B/L and unlabored GI: BS positive and NT, ND Extremity:  No Edema Skin:   Intact Neuro: Alert/Oriented, Abnormal Sensory has Less  LT sensation in thoracic paraspinal than lumbar, reduced LT bilateral lower legs but not feet, Abnormal Motor 5/5 in B Delt, bi , tri grip , 4- R HF, 4 L HF 4- R KE, 4 Left 4/5 B ankle DF and Abnormal FMC Ataxic/ dec FMC Musc/Skel:  Other no pain with UE or LE ROM Gen NAD   Assessment/Plan: 1. Functional deficits secondary to paraparesis, left upper ext ataxia, gait disorder CVA +/- spinal infarct which require 3+ hours per day of interdisciplinary therapy in a comprehensive inpatient rehab setting. Physiatrist is providing close team supervision and 24 hour management of active medical problems listed below. Physiatrist and rehab team continue to assess barriers to discharge/monitor patient progress  toward functional and medical goals. FIM: Function - Bathing Position: Sitting EOB Body parts bathed by patient: Right arm, Left arm, Chest, Abdomen, Right upper leg, Left upper leg, Right lower leg, Left lower leg, Front perineal area Body parts bathed by helper: Buttocks, Back Assist Level: Touching or steadying assistance(Pt > 75%)  Function- Upper  Body Dressing/Undressing What is the patient wearing?: Hospital gown Assist Level: Touching or steadying assistance(Pt > 75%) Function - Lower Body Dressing/Undressing What is the patient wearing?: Pants, Non-skid slipper socks Position: Sitting EOB Pants- Performed by patient: Thread/unthread right pants leg, Thread/unthread left pants leg, Pull pants up/down Non-skid slipper socks- Performed by patient: Don/doff right sock, Don/doff left sock Assist for footwear: Supervision/touching assist Assist for lower body dressing: Supervision or verbal cues  Function - Toileting Toileting steps completed by patient: Adjust clothing prior to toileting, Adjust clothing after toileting Toileting steps completed by helper: Performs perineal hygiene Toileting Assistive Devices: Grab bar or rail Assist level: Touching or steadying assistance (Pt.75%)  Function - Air cabin crew transfer assistive device: Grab bar, Walker Assist level to toilet: Touching or steadying assistance (Pt > 75%) Assist level from toilet: Touching or steadying assistance (Pt > 75%)  Function - Chair/bed transfer Chair/bed transfer method: Stand pivot Chair/bed transfer assist level: Touching or steadying assistance (Pt > 75%) Chair/bed transfer assistive device: Armrests, Walker Chair/bed transfer details: Verbal cues for technique, Verbal cues for precautions/safety, Verbal cues for safe use of DME/AE  Function - Locomotion: Wheelchair Will patient use wheelchair at discharge?: No Type: Manual Max wheelchair distance: 150 ft Assist Level: Supervision or  verbal cues Assist Level: Supervision or verbal cues Assist Level: Supervision or verbal cues Turns around,maneuvers to table,bed, and toilet,negotiates 3% grade,maneuvers on rugs and over doorsills: No Function - Locomotion: Ambulation Assistive device: Walker-rolling Max distance: 150 ft Assist level: Touching or steadying assistance (Pt > 75%) Assist level: Touching or steadying assistance (Pt > 75%) Assist level: Touching or steadying assistance (Pt > 75%) Assist level: Touching or steadying assistance (Pt > 75%) Walk 10 feet on uneven surfaces activity did not occur: Safety/medical concerns  Function - Comprehension Comprehension: Auditory Comprehension assist level: Understands complex 90% of the time/cues 10% of the time  Function - Expression Expression: Verbal Expression assist level: Expresses basic needs/ideas: With extra time/assistive device  Function - Social Interaction Social Interaction assist level: Interacts appropriately 90% of the time - Needs monitoring or encouragement for participation or interaction.  Function - Problem Solving Problem solving assist level: Solves basic problems with no assist  Function - Memory Memory assist level: Recognizes or recalls 90% of the time/requires cueing < 10% of the time Patient normally able to recall (first 3 days only): That he or she is in a hospital, Location of own room, Current season, Staff names and faces  Medical Problem List and Plan: 1. Paraparesis lower extremity sensory changes and gait disorder likely had spinal cord ischemia related to AAA rupture and Aortic arch plaque and ulceration, also has had a small cerebellar infarcts left cervicomedullary jct infarct which may result in LLE weakness given that Corticospinal tracts have crossed  lumbar MRI negative lower spinal infarct,should improve Cont CIR PT, OT  2. DVT Prophylaxis/Anticoagulation: Pharmaceutical:Coumadin and Lovenoxwith low dose ASA 3. Pain  Management:tylenol prn. Will add low dose gabapentin to help with dysesthesias. 4. Mood:LCSW to follow for evaluation and support. 5. Neuropsych: This patientiscapable of making decisions on hisown behalf. 6. Skin/Wound Care:Monitor wound for healing. Maintain adequate nutritional and hydration Status.  7. Fluids/Electrolytes/Nutrition:Monitor I/O-- lytes normal except mild elevation of creat, good meal intake I ~1648m, O ~1600, will d/c IVF and monitor 8. HTN: Monitor BP bid, controlled off meds  9. Ruptured aneurysm s/p repair with subsequent Endarterectomy: Monitor BP bid. Controlled at this time 10. CKD: Monitor for stability. Avoid nephrotoxic medication.  Baseline SCr 1.3- at baseline 11. Dyslipidemia: on Lipitor 12. Leucocytosis: Monitor for signs of infection. Completed antibiotic regimen on 08/12/17    LOS (Days) 2 A FACE TO FACE EVALUATION WAS PERFORMED  Charlett Blake 08/20/2017, 8:39 AM

## 2017-08-20 NOTE — IPOC Note (Addendum)
Overall Plan of Care [facility]) Patient Details Name: Thomas [name] MRN: [id number] DOB: [birth year]  Admitting Diagnosis: <principal problem not specified>  Hospital Problems: Active Problems:   Chronic ischemic vertebrobasilar artery cerebellar stroke   Cerebellar artery embolism   Gait disorder     Functional Problem List: Nursing Endurance, Medication Management, Pain, Perception, Safety  PT Balance, Safety, Behavior, Endurance, Motor, Pain, Skin Integrity, Sensory  OT Balance, Endurance, Motor, Safety, Sensory, Vision  SLP Cognition  TR         Basic ADL's: OT Grooming, Bathing, Dressing, Toileting     Advanced  ADL's: OT Simple Meal Preparation, Laundry, Light Housekeeping     Transfers: PT Bed Mobility, Bed to Chair, Car, [employer], Futures trader, Metallurgist: PT Ambulation, Emergency planning/management officer, Stairs     Additional Impairments: OT Other (comment)(attention to the right environment)  SLP Social Cognition   Problem Solving, Awareness  TR      Anticipated Outcomes Item Anticipated Outcome  Self Feeding Independent  Swallowing      Basic self-care  mod I with bathing, dressing, grooming  Toileting  mod I   Bathroom Transfers mod I to toilet; supervision to tub/shower  Bowel/Bladder  Supervision  Transfers  Mod I  Locomotion  supervision   Communication     Cognition  Mod I  Pain  <3 on a 0-10 pain scale  Safety/Judgment  min assist with ambulation with rolling walker   Therapy Plan: PT Intensity: Minimum of 1-2 x/day ,45 to 90 minutes PT Frequency: 5 out of 7 days PT Duration Estimated Length of Stay: 10-12 days OT Intensity: Minimum of 1-2 x/day, 45 to 90 minutes OT Frequency: 5 out of 7 days OT Duration/Estimated Length of Stay: 10-12 days SLP Intensity: Minumum of 1-2 x/day, 30 to 90 minutes SLP Frequency: 3 to 5 out of 7 days SLP Duration/Estimated Length of Stay: 7-10 days     Team Interventions: Nursing Interventions  Patient/Family Education, Psychosocial Support, Disease Management/Prevention, Medication Management, Discharge Planning  PT interventions Ambulation/gait training, Community reintegration, DME/adaptive equipment instruction, Neuromuscular re-education, IT trainer, UE/LE Strength taining/ROM, Wheelchair propulsion/positioning, Training and development officer, Discharge planning, Pain management, Skin care/wound management, Therapeutic Activities, Cognitive remediation/compensation, Disease management/prevention, Functional mobility training, Patient/family education, Splinting/orthotics, Therapeutic Exercise, Visual/perceptual remediation/compensation, UE/LE Coordination activities  OT Interventions Training and development officer, Community reintegration, Discharge planning, DME/adaptive equipment instruction, Functional mobility training, Neuromuscular re-education, Pain management, Patient/family education, Self Care/advanced ADL retraining, Therapeutic Activities, Therapeutic Exercise, UE/LE Strength taining/ROM, UE/LE Coordination activities, Visual/perceptual remediation/compensation  SLP Interventions Cognitive remediation/compensation, Cueing hierarchy, Functional tasks  TR Interventions    SW/CM Interventions Discharge Planning, Psychosocial Support, Patient/Family Education   Barriers to Discharge MD  Medical stability and Wound care  Nursing Decreased caregiver support, Lack of/limited family support, Medication compliance    PT Decreased caregiver support, Inaccessible home environment    OT Decreased caregiver support (Daughter lives close by but works; pt lives with cousin )  SLP      SW       Team Discharge Planning: Destination: PT-Home ,OT- Home , SLP-Home Projected Follow-up: PT-Home health PT, OT-  Home health OT, SLP-None Projected Equipment Needs: PT-To be determined, OT- Tub/shower bench, Rolling walker with 5" wheels, SLP-None recommended by SLP Equipment Details: PT- , OT-   Patient/family involved in discharge planning: PT- Patient,  OT-Patient, Family member/caregiver, SLP-Patient  MD ELOS: 10-12d Medical Rehab Prognosis:  Excellent Assessment:  62 year old male with history of CKD,ruptured aneurysm status post  bilateral iliofemoral endarterectomy with thromboembolectomy 425 complicated by 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 He completed his IV antibiotic close on 519 however was admitted on 5 /22/19with 3-day history of bilateral lower extremity weakness with abdominal and bilateral groin pain. CT abdomen pelvis done showed no evidence of leak and intense mural enhancement felt to be due to postop changes per Dr. Gwenlyn Saran. MRI of brain was repeated and showed acute subcentimeter infarcts bilateral cerebellar and right cervical medullary junction as well as slow flow versus occluded right-VA. Neurology was consulted for input and recommended DAPT with ECHO/TEE to rule out cardioembolic source. 2D echo done revealing EF 65-70% with moderate AVR.CTA chest 5/24 revealing 4.5 cm thoracic aortic aneurysm with soft plaque and minimal ulcerative plaque and bilateral centrilobar/paraseptal emphysema. Dr. Erlinda Hong recommended lovenox bridge with coumadin and low dose ASA for extensive soft plaque likely cause of stroke. To repeat CTA aorta in 3 months to decided on further course of anticoagulation or antiplatelet.   Now requiring 24/7 Rehab RN,MD, as well as CIR level PT, OT and SLP.  Treatment team will focus on ADLs and mobility with goals set at Mod I   See Team Conference Notes for weekly updates to the plan of care

## 2017-08-20 NOTE — Plan of Care (Signed)
  Problem: Consults Goal: RH STROKE PATIENT EDUCATION Description See Patient Education module for education specifics  Outcome: Progressing Goal: Nutrition Consult-if indicated Outcome: Progressing   Problem: RH SKIN INTEGRITY Goal: RH STG SKIN FREE OF INFECTION/BREAKDOWN Description Skin to remain free from breakdown while on rehab with supervision from staff.  Outcome: Progressing Goal: RH STG MAINTAIN SKIN INTEGRITY WITH ASSISTANCE Description STG Maintain Skin Integrity With Supervision Assistance.  Outcome: Progressing Flowsheets (Taken 08/20/2017 1304) STG: Maintain skin integrity with assistance: 5-Supervision/cueing   Problem: RH SAFETY Goal: RH STG ADHERE TO SAFETY PRECAUTIONS W/ASSISTANCE/DEVICE Description STG Adhere to Safety Precautions With min Assistance and appropriate assistive Device.  Outcome: Progressing Flowsheets (Taken 08/20/2017 1304) STG:Pt will adhere to safety precautions with assistance/device: 5-Supervision/cueing Goal: RH STG DECREASED RISK OF FALL WITH ASSISTANCE Description STG Decreased Risk of Fall With min Assistance.  Outcome: Progressing Flowsheets (Taken 08/20/2017 1304) LMB:[id number] risk of fall  with assistance/device: 5-Supervision/cueing   Problem: RH PAIN MANAGEMENT Goal: RH STG PAIN MANAGED AT OR BELOW PT'S PAIN GOAL Description <3 on a 0-10 pain scale  Outcome: Progressing   Problem: RH KNOWLEDGE DEFICIT Goal: RH STG INCREASE KNOWLEDGE OF HYPERTENSION Description Patient will be able to demonstrate knowledge of HTN medications, dietary restrictions, and follow up care with the MD with min assist from rehab staff.  Outcome: Progressing   Problem: RH Vision Goal: RH LTG Vision (Specify) Outcome: Progressing

## 2017-08-21 ENCOUNTER — Inpatient Hospital Stay (HOSPITAL_COMMUNITY): Payer: Private Health Insurance - Indemnity

## 2017-08-21 ENCOUNTER — Inpatient Hospital Stay (HOSPITAL_COMMUNITY): Payer: Private Health Insurance - Indemnity | Admitting: Occupational Therapy

## 2017-08-21 ENCOUNTER — Inpatient Hospital Stay (HOSPITAL_COMMUNITY): Payer: Private Health Insurance - Indemnity | Admitting: Physical Therapy

## 2017-08-21 LAB — CBC
HEMATOCRIT: 37.2 % — AB (ref 39.0–52.0)
HEMOGLOBIN: 12 g/dL — AB (ref 13.0–17.0)
MCH: 25.4 pg — AB (ref 26.0–34.0)
MCHC: 32.3 g/dL (ref 30.0–36.0)
MCV: 78.6 fL (ref 78.0–100.0)
PLATELETS: 361 10*3/uL (ref 150–400)
RBC: 4.73 MIL/uL (ref 4.22–5.81)
RDW: 15.9 % — ABNORMAL HIGH (ref 11.5–15.5)
WBC: 10.9 10*3/uL — AB (ref 4.0–10.5)

## 2017-08-21 LAB — BASIC METABOLIC PANEL
ANION GAP: 9 (ref 5–15)
BUN: 14 mg/dL (ref 6–20)
CHLORIDE: 107 mmol/L (ref 101–111)
CO2: 25 mmol/L (ref 22–32)
CREATININE: 1.38 mg/dL — AB (ref 0.61–1.24)
Calcium: 9 mg/dL (ref 8.9–10.3)
GFR calc non Af Amer: 53 mL/min — ABNORMAL LOW (ref >60)
Glucose, Bld: 94 mg/dL (ref 65–99)
Potassium: 3.6 mmol/L (ref 3.5–5.1)
SODIUM: 141 mmol/L (ref 135–145)

## 2017-08-21 LAB — PROTIME-INR
INR: 1.63
Prothrombin Time: 19.2 seconds — ABNORMAL HIGH (ref 11.4–15.2)

## 2017-08-21 SURGERY — ECHOCARDIOGRAM, TRANSESOPHAGEAL
Anesthesia: Moderate Sedation

## 2017-08-21 MED ORDER — WARFARIN SODIUM 5 MG PO TABS
5.0000 mg | ORAL_TABLET | Freq: Once | ORAL | Status: AC
  Administered 2017-08-21: 5 mg via ORAL
  Filled 2017-08-21: qty 1

## 2017-08-21 NOTE — Progress Notes (Signed)
Speech Language Pathology Daily Session Note  Patient Details  Name: Keylan Costabile MRN: [id number] Date of Birth: November 06, 1955  Today's Date: 08/21/2017 SLP Individual Time: 1330-1400 SLP Individual Time Calculation (min): 30 min  Short Term Goals: Week 1: SLP Short Term Goal 1 (Week 1): Pt will anticipate barriers to discharge and generate appropriate soultions at Mod I.  SLP Short Term Goal 2 (Week 1): Pt will complete semi-complex tasks at Mod I for functional problem solving.  SLP Short Term Goal 3 (Week 1): Pt will self-monitor and correct functional errors in semi-complex tasks at [place] services focused on cognitive skills. SLP facilitated semi-complex money management utilizing ALFA requiring supervision A verbal cues. SLP educated pt on current medication an dpt demonstrated recall of medication utilizing external aid. SLP recommends medication management and money management with checks in future sessions. Pt was left in room with call bell within reach. Reccomend to continue skilled ST services.     Function:  Eating Eating                 Cognition Comprehension Comprehension assist level: Understands complex 90% of the time/cues 10% of the time  Expression   Expression assist level: Expresses basic needs/ideas: With extra time/assistive device  Social Interaction Social Interaction assist level: Interacts appropriately 90% of the time - Needs monitoring or encouragement for participation or interaction.  Problem Solving Problem solving assist level: Solves complex 90% of the time/cues < 10% of the time  Memory Memory assist level: More than reasonable amount of time;Recognizes or recalls 90% of the time/requires cueing < 10% of the time    Pain Pain Assessment Pain Score: 0-No pain  Therapy/Group: Individual Therapy  Ezekeil Bethel  [facility] 08/21/2017, 2:15 PM

## 2017-08-21 NOTE — Progress Notes (Signed)
Physical Therapy Session Note  Patient Details  Name: Thomas [name] MRN: [id number] Date of Birth: [birth year]  Today's Date: 08/21/2017 PT Individual Time: 0940-1036 PT Individual Time Calculation (min): 56 min   Short Term Goals: Week 1:  PT Short Term Goal 1 (Week 1): Pt will ascend/descend 4 steps with AD and no handrails, min assist PT Short Term Goal 2 (Week 1): Pt will maintain dynamic standing balance without UE support, min assist PT Short Term Goal 3 (Week 1): Pt will perform bed<>w/c transfers with supervision   Skilled Therapeutic Interventions/Progress Updates:  Pt received in bed & agreeable to tx. Pt ambulates room<>gym with RW & min assist demonstrating decreased heel strike RLE & decreased hip flexion. Per manual muscle testing pt with significantly weaker hip flexors RLE compared to LLE (3+/5 R, 4+/5 LLE). Pt discussing his medical POC regarding loop recorder - reviewed information with pt and Dr. Phoebe Sharps plan to hold loop recorder placement for now. Pt reports he lives with his cousin & his family in a 1 level home with 2 short steps without rails to enter back door, recommended pt ask cousin if he can install a rail at home entrance. Pt negotiates 4 steps (6") with R rail then with no rails with min assist with further decreased balance. Pt requires cuing for compensatory pattern. Transitioned to supine on mat table  pt performed RLE single leg bridging (task focused on strengthening & NMR, as pt with decreased control/balance RLE), quadruped with single LE lift with task focusing on core strengthening & balance (attempted bird dog but pt unable to maintain balance). When ambulating back to room & at end of session pt reported dizziness and as though his head is "getting bigger". Pt left in bed with alarm set & needs within reach & RN made aware of pt's complaints.   Therapy Documentation Precautions:  Precautions Precautions: Fall Restrictions Weight Bearing Restrictions:  No  Pain: Pt reports pain at surgical incision but reports being premedicated.   See Function Navigator for Current Functional Status.   Therapy/Group: Individual Therapy  [name] 08/21/2017, 12:22 PM

## 2017-08-21 NOTE — Progress Notes (Signed)
ANTICOAGULATION CONSULT NOTE - Follow-up Consult  Pharmacy Consult for Enoxaparin and Warfarin Indication: Soft plaque (clot) in ascending arch and descending aorta  No Known Allergies  Patient Measurements: Height: 5\' 7"  (170.2 cm) Weight: 118 lb 9.7 oz (53.8 kg) IBW/kg (Calculated) : 66.1 Heparin Dosing Weight: 56.7 kg  Vital Signs: Temp: 98.2 F (36.8 C) (05/28 1043) Temp Source: Oral (05/28 1043) BP: 139/85 (05/28 1043) Pulse Rate: 80 (05/28 1043)  Labs: Recent Labs    08/19/17 0448 08/20/17 0502 08/21/17 0510  HGB 12.3* 11.5* 12.0*  HCT 37.7* 35.3* 37.2*  PLT 364 344 361  LABPROT 14.0 16.3* 19.2*  INR 1.09 1.32 1.63  CREATININE 1.30*  --  1.38*   Estimated Creatinine Clearance: 41.7 mL/min (A) (by C-G formula based on SCr of 1.38 mg/dL (H)).  Assessment: 43 yoM with clot in aorta, likely source of embolic stroke. Pharmacy consulted to dose warfarin and Lovenox bridge to therapeutic INR. INR increasing but remains below goal at 1.63 today. CBC stable, no bleeding noted.  Goal of Therapy:  INR 2-3 Monitor platelets by anticoagulation protocol: Yes   Plan:  Continue Lovenox 60mg  SQ q12h Warfarin 5 mg PO x1 tonight. Daily INR Monitor s/sx of bleeding  Assunta Curtis, [place] Clinical Pharmacist 08/21/17   12:29 PM

## 2017-08-21 NOTE — Progress Notes (Signed)
Physical Therapy Session Note  Patient Details  Name: Thomas [name] MRN: [id number] Date of Birth: [birth year]  Today's Date: 08/21/2017 PT Individual Time: 4098-1191 PT Individual Time Calculation (min): 29 min   Short Term Goals: Week 1:  PT Short Term Goal 1 (Week 1): Pt will ascend/descend 4 steps with AD and no handrails, min assist PT Short Term Goal 2 (Week 1): Pt will maintain dynamic standing balance without UE support, min assist PT Short Term Goal 3 (Week 1): Pt will perform bed<>w/c transfers with supervision   Skilled Therapeutic Interventions/Progress Updates:    Pt supine in bed upon PT arrival, agreeable to therapy tx and denies pain. Pt transferred to sitting with supervision and ambulated into the bathroom with RW and min assist, performed clothing management with supervision. Pt ambulated to sink with RW and min assist, washed hands. Pt transported to gym in w/c. Pt worked on dynamic standing balance without AD in order to kick soccer ball against wall, min-mod assist to maintain balance. Pt ambulated 2 x 80 ft with RW and min assist working on LE endurance. Pt transported back to room and transferred to bed with min assist. Left supine in bed with needs in reach.   Therapy Documentation Precautions:  Precautions Precautions: Fall Restrictions Weight Bearing Restrictions: No   See Function Navigator for Current Functional Status.   Therapy/Group: Individual Therapy  Netta Corrigan, PT, DPT 08/21/2017, 4:34 PM

## 2017-08-21 NOTE — Progress Notes (Signed)
Occupational Therapy Session Note  Patient Details  Name: Thomas [name] MRN: [id number] Date of Birth: 1955-08-01  Today's Date: 08/21/2017 OT Individual Time: 1045-1200 OT Individual Time Calculation (min): 75 min    Short Term Goals: Week 1:  OT Short Term Goal 1 (Week 1): Pt will perform bathing at shower level with supervision for safety and balance.   OT Short Term Goal 2 (Week 1): Pt will attend to right side of body and environment in functional tasks with 1 verbal cue.   OT Short Term Goal 3 (Week 1): Pt will utilize correct hand placement with no verbal cues.    Skilled Therapeutic Interventions/Progress Updates:    Pt seen for OT ADl bathing/dressing session and for therapeutic activity with emphasis on functional standing balance and endurance. Pt in supine upon arrival with RN present. Pt with epsidoes of severe dizziness this AM, however, reports feeling better and willing to attempt therapy. He ambulated throughout room with RW, required VCs for correct hand palcement on RW and RW management in functional context.  He bathed seated on tub transfer bench with supervision, standing with support of grab bar to complete pericare/buttock hygiene. He dressed seated on toilet, one LOB episode to R requiring mod A and use of wall to prevent fall. VCs for safety awareness to sit to complete dynamic LB dressing task vs. Standing to thread LE.  Following seated rest break donning lotion, he ambulated to therapy day room with steadying assist and VCs for less UE reliance on RW.  Pt completed horseshoe toss, first trial standing with support of RW, second trial without UE support. Min A overall for standing balance. He then ambulated to pick up horseshoes, bending to floor to retrieve items with heavy steadying assist.  Following rest break, pt then completed tennis like activity as pt stated he enjoyed sports. Pt able to knock ball back and forth with recreation therapist, reaching across midline  and outside BOS to hit ball. Following rest break, pt ambulated back to room at end of session, left seated in w/c with QRB donned and all needs in reach, reviewed need for use of hospital staff assisting for mobility.    Therapy Documentation Precautions:  Precautions Precautions: Fall Restrictions Weight Bearing Restrictions: No Pain:   No/denies pain  See Function Navigator for Current Functional Status.   Therapy/Group: Individual Therapy  Sharelle Burditt L 08/21/2017, 6:58 AM

## 2017-08-21 NOTE — Progress Notes (Signed)
Subjective/Complaints: Pt states his daughter can help him at home but works during the day  Still has lower abd and groin pain not progressive denies any bladder issue  Review of systems denies chest pain shortness of breath nausea vomiting diarrhea or constipation Objective: Vital Signs: Blood pressure (!) 135/91, pulse 82, temperature 97.7 F (36.5 C), temperature source Oral, resp. rate 18, height '5\' 7"'  (1.702 m), weight 53.8 kg (118 lb 9.7 oz), SpO2 95 %. Mr Lumbar Spine Wo Contrast  Result Date: 08/19/2017 CLINICAL DATA:  Back pain.  Cauda equina syndrome suspected. EXAM: MRI LUMBAR SPINE WITHOUT CONTRAST TECHNIQUE: Multiplanar, multisequence MR imaging of the lumbar spine was performed. No intravenous contrast was administered. COMPARISON:  Multiple prior CT scans of the abdomen and pelvis. FINDINGS: Segmentation:  Transitional L5. Alignment:  Anatomic. Vertebrae: No worrisome osseous lesions. T1 hypointense, T2 hyperintense edema in the L2, L4, L5 and S1 vertebrae have no correlate on CT and are non worrisome. Atypical hemangiomata versus bone infarction not excluded. Conus medullaris and cauda equina: The cord is visualized as far cephalad as mid T10. Conus extends to the T12-L1 level. Conus and cauda equina appear normal. Within limits for detection on lumbar spine MRI, no visible spinal cord infarct. Paraspinal and other soft tissues: Large abdominal aortic aneurysm has been stented. Disc levels: L1-L2:  Normal. L2-L3:  Normal disc space.  Mild facet arthropathy.  No impingement. L3-L4:  Annular bulge.  Facet arthropathy.  No impingement. L4-L5:  Annular bulge.  Facet arthropathy.  No impingement. L5-S1:  Transitional interspace.  No impingement. IMPRESSION: No disc protrusion or spinal stenosis. Lower lumbar facet arthropathy. No compressive lesion is identified. No evidence for cauda equina syndrome. No visible spinal cord infarct, with visualization as far cephalad as mid T10.  Electronically Signed   By: Staci Righter M.D.   On: 08/19/2017 15:15   Results for orders placed or performed during the hospital encounter of 08/18/17 (from the past 72 hour(s))  CBC WITH DIFFERENTIAL     Status: Abnormal   Collection Time: 08/19/17  4:48 AM  Result Value Ref Range   WBC 10.8 (H) 4.0 - 10.5 K/uL   RBC 4.88 4.22 - 5.81 MIL/uL   Hemoglobin 12.3 (L) 13.0 - 17.0 g/dL   HCT 37.7 (L) 39.0 - 52.0 %   MCV 77.3 (L) 78.0 - 100.0 fL   MCH 25.2 (L) 26.0 - 34.0 pg   MCHC 32.6 30.0 - 36.0 g/dL   RDW 15.8 (H) 11.5 - 15.5 %   Platelets 364 150 - 400 K/uL   Neutrophils Relative % 39 %   Neutro Abs 4.2 1.7 - 7.7 K/uL   Lymphocytes Relative 38 %   Lymphs Abs 4.1 (H) 0.7 - 4.0 K/uL   Monocytes Relative 5 %   Monocytes Absolute 0.6 0.1 - 1.0 K/uL   Eosinophils Relative 17 %   Eosinophils Absolute 1.9 (H) 0.0 - 0.7 K/uL   Basophils Relative 1 %   Basophils Absolute 0.1 0.0 - 0.1 K/uL   Immature Granulocytes 0 %   Abs Immature Granulocytes 0.0 0.0 - 0.1 K/uL    Comment: Performed at [facility], [address] [place], [place] [address]  Comprehensive metabolic panel     Status: Abnormal   Collection Time: 08/19/17  4:48 AM  Result Value Ref Range   Sodium 140 135 - 145 mmol/L   Potassium 3.6 3.5 - 5.1 mmol/L   Chloride 108 101 - 111 mmol/L   CO2  26 22 - 32 mmol/L   Glucose, Bld 91 65 - 99 mg/dL   BUN 13 6 - 20 mg/dL   Creatinine, Ser 1.30 (H) 0.61 - 1.24 mg/dL   Calcium 9.0 8.9 - 10.3 mg/dL   Total Protein 7.7 6.5 - 8.1 g/dL   Albumin 3.0 (L) 3.5 - 5.0 g/dL   AST 20 15 - 41 U/L   ALT 16 (L) 17 - 63 U/L   Alkaline Phosphatase 92 38 - 126 U/L   Total Bilirubin 0.6 0.3 - 1.2 mg/dL   GFR calc non Af Amer 57 (L) >60 mL/min   GFR calc Af Amer >60 >60 mL/min    Comment: (NOTE) The eGFR has been calculated using the CKD EPI equation. This calculation has not been validated in all clinical situations. eGFR's persistently <60 mL/min signify possible Chronic  Kidney Disease.    Anion gap 6 5 - 15    Comment: Performed at [facility] [address] [place], [place] [address]  Protime-INR     Status: None   Collection Time: 08/19/17  4:48 AM  Result Value Ref Range   Prothrombin Time 14.0 11.4 - 15.2 seconds   INR 1.09     Comment: Performed at [facility] [address] [place], [place] [address]  CBC     Status: Abnormal   Collection Time: 08/20/17  5:02 AM  Result Value Ref Range   WBC 10.2 4.0 - 10.5 K/uL   RBC 4.48 4.22 - 5.81 MIL/uL   Hemoglobin 11.5 (L) 13.0 - 17.0 g/dL   HCT 35.3 (L) 39.0 - 52.0 %   MCV 78.8 78.0 - 100.0 fL   MCH 25.7 (L) 26.0 - 34.0 pg   MCHC 32.6 30.0 - 36.0 g/dL   RDW 16.0 (H) 11.5 - 15.5 %   Platelets 344 150 - 400 K/uL    Comment: Performed at [facility] [address] [place], [place] [address]  Protime-INR     Status: Abnormal   Collection Time: 08/20/17  5:02 AM  Result Value Ref Range   Prothrombin Time 16.3 (H) 11.4 - 15.2 seconds   INR 1.32     Comment: Performed at [facility] [address], [place], [place] [address]  CBC     Status: Abnormal   Collection Time: 08/21/17  5:10 AM  Result Value Ref Range   WBC 10.9 (H) 4.0 - 10.5 K/uL   RBC 4.73 4.22 - 5.81 MIL/uL   Hemoglobin 12.0 (L) 13.0 - 17.0 g/dL   HCT 37.2 (L) 39.0 - 52.0 %   MCV 78.6 78.0 - 100.0 fL   MCH 25.4 (L) 26.0 - 34.0 pg   MCHC 32.3 30.0 - 36.0 g/dL   RDW 15.9 (H) 11.5 - 15.5 %   Platelets 361 150 - 400 K/uL    Comment: Performed at [facility] [address], [place], [place] [address]  Protime-INR     Status: Abnormal   Collection Time: 08/21/17  5:10 AM  Result Value Ref Range   Prothrombin Time 19.2 (H) 11.4 - 15.2 seconds   INR 1.63     Comment: Performed at [facility] [address], [place],  [address]  Basic metabolic panel     Status: Abnormal   Collection Time: 08/21/17  5:10 AM  Result Value Ref Range   Sodium 141 135 - 145 mmol/L   Potassium 3.6  3.5 - 5.1  mmol/L   Chloride 107 101 - 111 mmol/L   CO2 25 22 - 32 mmol/L   Glucose, Bld 94 65 - 99 mg/dL   BUN 14 6 - 20 mg/dL   Creatinine, Ser 1.38 (H) 0.61 - 1.24 mg/dL   Calcium 9.0 8.9 - 10.3 mg/dL   GFR calc non Af Amer 53 (L) >60 mL/min   GFR calc Af Amer >60 >60 mL/min    Comment: (NOTE) The eGFR has been calculated using the CKD EPI equation. This calculation has not been validated in all clinical situations. eGFR's persistently <60 mL/min signify possible Chronic Kidney Disease.    Anion gap 9 5 - 15    Comment: Performed at [facility] [address], [place], [place] [address]     HEENT: Right facial numbness no droop Cardio: RRR and no murmur Resp: CTA B/L and unlabored GI: BS positive and NT, ND Extremity:  No Edema Skin:   Intact Neuro: Alert/Oriented, Abnormal Sensory has Less  LT sensation in thoracic paraspinal than lumbar, reduced LT bilateral lower legs but not feet, Abnormal Motor 5/5 in B Delt, bi , tri grip , 4- R HF, 4 L HF 4- R KE, 4 Left 4/5 B ankle DF and Abnormal FMC Ataxic/ dec FMC Musc/Skel:  Other no pain with UE or LE ROM Gen NAD   Assessment/Plan: 1. Functional deficits secondary to paraparesis, left upper ext ataxia, gait disorder CVA +/- spinal infarct which require 3+ hours per day of interdisciplinary therapy in a comprehensive inpatient rehab setting. Physiatrist is providing close team supervision and 24 hour management of active medical problems listed below. Physiatrist and rehab team continue to assess barriers to discharge/monitor patient progress toward functional and medical goals. FIM: Function - Bathing Position: Shower Body parts bathed by patient: Right arm, Left arm, Chest, Abdomen, Right upper leg, Left upper leg, Right lower leg, Left lower leg, Front perineal area, Buttocks, Back Body parts bathed by helper: Buttocks, Back Assist Level: Supervision or verbal cues  Function- Upper Body Dressing/Undressing What is  the patient wearing?: Pull over shirt/dress Pull over shirt/dress - Perfomed by patient: Thread/unthread right sleeve, Thread/unthread left sleeve, Put head through opening, Pull shirt over trunk Assist Level: Supervision or verbal cues, Set up Set up : To obtain clothing/put away Function - Lower Body Dressing/Undressing What is the patient wearing?: Underwear, Pants, Non-skid slipper socks Position: Other (comment)(Sitting on toilet) Underwear - Performed by patient: Thread/unthread right underwear leg, Thread/unthread left underwear leg, Pull underwear up/down Pants- Performed by patient: Thread/unthread right pants leg, Thread/unthread left pants leg, Pull pants up/down Non-skid slipper socks- Performed by patient: Don/doff right sock, Don/doff left sock Assist for footwear: Supervision/touching assist Assist for lower body dressing: Touching or steadying assistance (Pt > 75%)  Function - Toileting Toileting steps completed by patient: Performs perineal hygiene Toileting steps completed by helper: Adjust clothing prior to toileting, Adjust clothing after toileting Toileting Assistive Devices: Grab bar or rail Assist level: Touching or steadying assistance (Pt.75%)  Function - Air cabin crew transfer assistive device: Walker, Grab bar Assist level to toilet: Touching or steadying assistance (Pt > 75%) Assist level from toilet: Touching or steadying assistance (Pt > 75%)  Function - Chair/bed transfer Chair/bed transfer method: Stand pivot Chair/bed transfer assist level: Touching or steadying assistance (Pt > 75%) Chair/bed transfer assistive device: Armrests, Walker Chair/bed transfer details: Verbal cues for technique, Verbal cues for precautions/safety, Verbal cues for safe use of DME/AE  Function - Locomotion: Wheelchair  Will patient use wheelchair at discharge?: No Type: Manual Max wheelchair distance: 150 ft Assist Level: Supervision or verbal cues Assist Level:  Supervision or verbal cues Assist Level: Supervision or verbal cues Turns around,maneuvers to table,bed, and toilet,negotiates 3% grade,maneuvers on rugs and over doorsills: No Function - Locomotion: Ambulation Assistive device: Walker-rolling Max distance: (150 ft) Assist level: Touching or steadying assistance (Pt > 75%) Assist level: Touching or steadying assistance (Pt > 75%) Assist level: Touching or steadying assistance (Pt > 75%) Assist level: Touching or steadying assistance (Pt > 75%) Walk 10 feet on uneven surfaces activity did not occur: Safety/medical concerns  Function - Comprehension Comprehension: Auditory Comprehension assist level: Understands complex 90% of the time/cues 10% of the time  Function - Expression Expression: Verbal Expression assist level: Expresses basic needs/ideas: With extra time/assistive device  Function - Social Interaction Social Interaction assist level: Interacts appropriately 90% of the time - Needs monitoring or encouragement for participation or interaction.  Function - Problem Solving Problem solving assist level: Solves complex 90% of the time/cues < 10% of the time  Function - Memory Memory assist level: More than reasonable amount of time, Recognizes or recalls 90% of the time/requires cueing < 10% of the time Patient normally able to recall (first 3 days only): That he or she is in a hospital, Location of own room, Current season, Staff names and faces  Medical Problem List and Plan: 1. Paraparesis lower extremity sensory changes and gait disorder likely had spinal cord ischemia related to AAA rupture and Aortic arch plaque and ulceration, also has had a small cerebellar infarcts left cervicomedullary jct infarct which may result in LLE weakness given that Corticospinal tracts have crossed Team conf in am  2. DVT Prophylaxis/Anticoagulation: Pharmaceutical:Coumadin and Lovenoxwith low dose ASA 3. Pain Management:tylenol prn.  Will add low dose gabapentin to help with dysesthesias. 4. Mood:LCSW to follow for evaluation and support. 5. Neuropsych: This patientiscapable of making decisions on hisown behalf. 6. Skin/Wound Care:Monitor wound for healing. Maintain adequate nutritional and hydration Status.  7. Fluids/Electrolytes/Nutrition:Monitor I/O-- lytes normal except mild elevation of creat, good meal intake I ~1621m, O ~1600, will d/c IVF and monitor 8. HTN: Monitor BP bid, controlled off meds  , diastolic creeping up will monitor Vitals:   08/20/17 2014 08/21/17 0514  BP: (!) 136/93 (!) 135/91  Pulse: 87 82  Resp: 18 18  Temp: 98.5 F (36.9 C) 97.7 F (36.5 C)  SpO2: 97% 95%   9. Ruptured aneurysm s/p repair with subsequent Endarterectomy: Monitor BP bid. Controlled at this time 10. CKD: Monitor for stability. Avoid nephrotoxic medication. Baseline SCr 1.3- at baseline 11. Dyslipidemia: on Lipitor 12. Leucocytosis:improving minimal elevation Monitor for signs of infection. Completed antibiotic regimen on 08/12/17     LOS (Days) 3 A FACE TO FACE EVALUATION WAS PERFORMED  ACharlett Blake5/28/2019, 8:54 AM

## 2017-08-22 ENCOUNTER — Inpatient Hospital Stay (HOSPITAL_COMMUNITY): Admission: RE | Admit: 2017-08-22 | Payer: Private Health Insurance - Indemnity | Source: Ambulatory Visit

## 2017-08-22 ENCOUNTER — Inpatient Hospital Stay (HOSPITAL_COMMUNITY): Payer: Private Health Insurance - Indemnity

## 2017-08-22 ENCOUNTER — Inpatient Hospital Stay (HOSPITAL_COMMUNITY): Payer: Private Health Insurance - Indemnity | Admitting: Physical Therapy

## 2017-08-22 ENCOUNTER — Inpatient Hospital Stay (HOSPITAL_COMMUNITY): Payer: Private Health Insurance - Indemnity | Admitting: Occupational Therapy

## 2017-08-22 LAB — CBC
HEMATOCRIT: 38.9 % — AB (ref 39.0–52.0)
HEMOGLOBIN: 12.4 g/dL — AB (ref 13.0–17.0)
MCH: 25.2 pg — AB (ref 26.0–34.0)
MCHC: 31.9 g/dL (ref 30.0–36.0)
MCV: 79.1 fL (ref 78.0–100.0)
Platelets: 350 10*3/uL (ref 150–400)
RBC: 4.92 MIL/uL (ref 4.22–5.81)
RDW: 16.2 % — ABNORMAL HIGH (ref 11.5–15.5)
WBC: 10 10*3/uL (ref 4.0–10.5)

## 2017-08-22 LAB — PROTIME-INR
INR: 1.79
Prothrombin Time: 20.6 seconds — ABNORMAL HIGH (ref 11.4–15.2)

## 2017-08-22 MED ORDER — WARFARIN SODIUM 5 MG PO TABS
5.0000 mg | ORAL_TABLET | Freq: Once | ORAL | Status: AC
  Administered 2017-08-22: 5 mg via ORAL
  Filled 2017-08-22: qty 1

## 2017-08-22 NOTE — Progress Notes (Signed)
ANTICOAGULATION CONSULT NOTE - Follow-up Consult  Pharmacy Consult for Enoxaparin and Warfarin Indication: Soft plaque (clot) in ascending arch and descending aorta  No Known Allergies  Patient Measurements: Height: 5\' 7"  (170.2 cm) Weight: 118 lb 9.7 oz (53.8 kg) IBW/kg (Calculated) : 66.1 Heparin Dosing Weight: 56.7 kg  Vital Signs: Temp: 98.4 F (36.9 C) (05/29 0502) Temp Source: Oral (05/29 0502) BP: 118/95 (05/29 0502) Pulse Rate: 91 (05/29 0502)  Labs: Recent Labs    08/20/17 0502 08/21/17 0510 08/22/17 0541  HGB 11.5* 12.0* 12.4*  HCT 35.3* 37.2* 38.9*  PLT 344 361 350  LABPROT 16.3* 19.2* 20.6*  INR 1.32 1.63 1.79  CREATININE  --  1.38*  --    Estimated Creatinine Clearance: 41.7 mL/min (A) (by C-G formula based on SCr of 1.38 mg/dL (H)).  Assessment: 48 yoM with clot in aorta, likely source of embolic stroke. Pharmacy dosing warfarin and Lovenox bridge to therapeutic INR. INR increasing toward  goal at 1.79 today. CBC stable, no bleeding noted.  Goal of Therapy:  INR 2-3 Monitor platelets by anticoagulation protocol: Yes   Plan:  Continue Lovenox 60mg  SQ q12h Warfarin 5 mg PO x1 tonight. Daily INR Monitor s/sx of bleeding  Assunta Curtis, [facility] Clinical Pharmacist 08/22/17   8:41 AM

## 2017-08-22 NOTE — Progress Notes (Signed)
[facility] Individual Statement of Services  Patient Name:  Thomas [name]  Date:  08/22/2017  Welcome to the [facility].  Our goal is to provide you with an individualized program based on your diagnosis and situation, designed to meet your specific needs.  With this comprehensive rehabilitation program, you will be expected to participate in at least 3 hours of rehabilitation therapies Monday-Friday, with modified therapy programming on the weekends.  Your rehabilitation program will include the following services:  Physical Therapy (PT), Occupational Therapy (OT), Speech Therapy (ST), 24 hour per day rehabilitation nursing, Case Management (Social Worker), Rehabilitation Medicine, Nutrition Services and Pharmacy Services  Weekly team conferences will be held on Wednesdays to discuss your progress.  Your Social Worker will talk with you frequently to get your input and to update you on team discussions.  Team conferences with you and your family in attendance may also be held.  Expected length of stay: 10 to 12 days  Overall anticipated outcome:  Supervision to modified independent  Depending on your progress and recovery, your program may change. Your Social Worker will coordinate services and will keep you informed of any changes. Your Social Worker's name and contact numbers are listed  below.  The following services may also be recommended but are not provided by the [facility] will be made to provide these services after discharge if needed.  Arrangements include referral to agencies that provide these services.  Your insurance has been verified to be:  [employer]  Your primary doctor is:  You do not currently have one due to no insurance coverage for PCP.  Sonia Baller will try to connect  you to one of the community clinics.  Pertinent information will be shared with your doctor and your insurance company.  Social Worker:  Alfonse Alpers, LCSW  [phone] or ([phone]  Information discussed with and copy given to patient by: Trey Sailors, 08/22/2017, 12:08 PM

## 2017-08-22 NOTE — Progress Notes (Signed)
Social Work Patient ID: Thomas [name], male   DOB: 08/30/1955, 62 y.o.   MRN: [id number]   CSW met with pt to update him on team conference discussion and targeted d/c date of 08-29-17.  Pt had some support from his dtr, cousin, and cousin's wife, but probably no one will be with him 24/7.  Pt has some other extended family who can assist with transportation to doctor's appointments and other errands.  Pt has little insurance coverage and will have to pay privately for most of his DME and f/u therapies.  CSW will continue to follow and assist pt as needed.

## 2017-08-22 NOTE — Patient Care Conference (Signed)
Inpatient RehabilitationTeam Conference and Plan of Care Update Date: 08/22/2017   Time: 10:30 AM    Patient Name: Thomas [name]      Medical Record Number: [id number]  Date of Birth: [birth year] Sex: Male         Room/Bed: 4W25C/4W25C-01 Payor Info: Payor: [employer] / Plan: [employer] INDEMNITY / Product Type: *No Product type* /    Admitting Diagnosis: rehab  Admit Date/Time:  08/18/2017  4:36 PM Admission Comments: No comment available   Primary Diagnosis:  <principal problem not specified> Principal Problem: <principal problem not specified>  Patient Active Problem List   Diagnosis Date Noted  . Chronic ischemic vertebrobasilar artery cerebellar stroke 08/18/2017  . Cerebellar artery embolism   . Gait disorder   . Cerebral embolism with cerebral infarction 08/16/2017  . Paraparesis ([facility])   . Hypertension 08/15/2017  . Gait instability 08/15/2017  . Pelvic pain 08/15/2017  . Chronic kidney disease 08/15/2017  . Abnormal CT scan, pelvis 08/15/2017  . Meniere disease, left 08/08/2017  . Sepsis due to Streptococcus pneumoniae ([facility])   . Acute respiratory failure with hypoxia ([facility])   . Hemorrhagic shock ([facility]) 07/02/2017  . Acute blood loss anemia 07/02/2017  . S/P AAA repair   . LLQ abdominal pain   . Ruptured abdominal aortic aneurysm (AAA) ([facility])   . Hypotension   . Bacteremia due to Streptococcus 06/30/2017  . CVA (cerebral vascular accident) ([facility]) 06/30/2017  . Chest pain 06/28/2017  . CAP (community acquired pneumonia): Clinical 06/28/2017  . Acute renal failure superimposed on stage 3 chronic kidney disease ([facility]) 06/28/2017  . Dehydration 06/28/2017  . Leukocytosis 06/28/2017  . Elevated troponin 06/28/2017  . Dizziness 06/28/2017  . Vertigo 06/28/2017  . Acute pericarditis: Probable 06/28/2017    Expected Discharge Date: Expected Discharge Date: 08/29/17  Team Members Present: Physician leading conference: Dr. Alysia Penna Social Worker Present: Alfonse Alpers,  LCSW Nurse Present: Other (comment)(Mekedes Nida, LPN) PT Present: Lavone Nian, PT OT Present: Napoleon Form, OT SLP Present: Stormy Fabian, SLP PPS Coordinator present : Daiva Nakayama, RN, CRRN     Current Status/Progress Goal Weekly Team Focus  Medical   Right lower ext weakness and ataxia  Improve balance to reduce fall risk  Increase lower ext strength    Bowel/Bladder   continent of b/b  maintain continence  Assist with toileting as needed   Swallow/Nutrition/ Hydration             ADL's   Close supervision-min A using RW with VCs for safety awareness  Supervision-mod I overall  ADL re-training, neuro re-ed, safety wareness, functional balance and activity tolerance, d/c planning   Mobility   min assist overall with RW, weak R hip flexors, impaired balance/R NMR  supervision overall with LRAD  R NMR, transfers, gait, strengthening, balance, stair negotiation, pt education   Communication             Safety/Cognition/ Behavioral Observations  supervision A  Mod I  anticipatory, semi-complex problem solving and error awareness   Pain   C/o pain 4/10 at bilateral groin surgical site  Pain < 2  Assess and medicate as needed   Skin   Skin intact  Prevent infection/breakdown  Assess qshift and prn    Rehab Goals Patient on target to meet rehab goals: Yes Rehab Goals Revised: none *See Care Plan and progress notes for long and short-term goals.     Barriers to Discharge  Current Status/Progress Possible Resolutions Date Resolved   Physician  Medical stability     Porgressing toward goals  Cont rehab      Nursing  Decreased caregiver support;Lack of/limited family support;Medication compliance               PT  Decreased caregiver support;Home environment access/layout  unsure if family can provide 24 hr supervision, stairs without rails to enter home              OT Decreased caregiver support  (Daughter lives close by but works; pt lives with cousin )             SLP                 SW                Discharge Planning/Teaching Needs:  Pt plans to return to his home he shares with his cousin and cousin's wife.  Cousin's wife is there often and dtr and cousin can assist pt intermittently.  Pt has not identified a single caregiver who will be with pt 24/7.   Team Discussion:  Pt with ataxia due to bilateral cerebellar stroke.  He's doing pretty well cognitively.  Pt has some chronic pain in groin areas where his incisions are.  Pt is close S to min A now with ADLs and requires cues for safety awareness and he doesn't like having to be cued.  Pt has chronic dizziness, so may need supervision for some tasks.  Pt is min A with PT and right leg and hip are weak, may need foot up brace.  Has stairs at home without rails and PT is recommending pt's cousin install rails.  Pt has mod I goals for speech.  Pt has little insurance coverage, so CSW share with pt community resources, as appropriate to his needs.  Revisions to Treatment Plan:  none    Continued Need for Acute Rehabilitation Level of Care: The patient requires daily medical management by a physician with specialized training in physical medicine and rehabilitation for the following conditions: Daily direction of a multidisciplinary physical rehabilitation program to ensure safe treatment while eliciting the highest outcome that is of practical value to the patient.: Yes Daily medical management of patient stability for increased activity during participation in an intensive rehabilitation regime.: Yes Daily analysis of laboratory values and/or radiology reports with any subsequent need for medication adjustment of medical intervention for : Neurological problems  Coulson Wehner, Silvestre Mesi 08/22/2017, 11:47 AM

## 2017-08-22 NOTE — Plan of Care (Signed)
IADL goals downgraded to supervision due to balance and safety concerns. See POC for goal details. Arthelia Callicott, OTR/L

## 2017-08-22 NOTE — Progress Notes (Signed)
Subjective/Complaints: Pt working with SLP , did well with Checkbook management-Mod I No LE pain  Review of systems denies chest pain shortness of breath nausea vomiting diarrhea or constipation Objective: Vital Signs: Blood pressure (!) 118/95, pulse 91, temperature 98.4 F (36.9 C), temperature source Oral, resp. rate 20, height 5' 7" (1.702 m), weight 53.8 kg (118 lb 9.7 oz), SpO2 97 %. No results found. Results for orders placed or performed during the hospital encounter of 08/18/17 (from the past 72 hour(s))  CBC     Status: Abnormal   Collection Time: 08/20/17  5:02 AM  Result Value Ref Range   WBC 10.2 4.0 - 10.5 K/uL   RBC 4.48 4.22 - 5.81 MIL/uL   Hemoglobin 11.5 (L) 13.0 - 17.0 g/dL   HCT 35.3 (L) 39.0 - 52.0 %   MCV 78.8 78.0 - 100.0 fL   MCH 25.7 (L) 26.0 - 34.0 pg   MCHC 32.6 30.0 - 36.0 g/dL   RDW 16.0 (H) 11.5 - 15.5 %   Platelets 344 150 - 400 K/uL    Comment: Performed at [facility], [place] [address], [place], [place] [address]  Protime-INR     Status: Abnormal   Collection Time: 08/20/17  5:02 AM  Result Value Ref Range   Prothrombin Time 16.3 (H) 11.4 - 15.2 seconds   INR 1.32     Comment: Performed at [facility] [address] [place], [place] [address]  CBC     Status: Abnormal   Collection Time: 08/21/17  5:10 AM  Result Value Ref Range   WBC 10.9 (H) 4.0 - 10.5 K/uL   RBC 4.73 4.22 - 5.81 MIL/uL   Hemoglobin 12.0 (L) 13.0 - 17.0 g/dL   HCT 37.2 (L) 39.0 - 52.0 %   MCV 78.6 78.0 - 100.0 fL   MCH 25.4 (L) 26.0 - 34.0 pg   MCHC 32.3 30.0 - 36.0 g/dL   RDW 15.9 (H) 11.5 - 15.5 %   Platelets 361 150 - 400 K/uL    Comment: Performed at [facility] [address] [place], [place] [address]  Protime-INR     Status: Abnormal   Collection Time: 08/21/17  5:10 AM  Result Value Ref Range   Prothrombin Time 19.2 (H) 11.4 - 15.2 seconds   INR 1.63     Comment: Performed at [facility] [address], [place], [place]  [address]  Basic metabolic panel     Status: Abnormal   Collection Time: 08/21/17  5:10 AM  Result Value Ref Range   Sodium 141 135 - 145 mmol/L   Potassium 3.6 3.5 - 5.1 mmol/L   Chloride 107 101 - 111 mmol/L   CO2 25 22 - 32 mmol/L   Glucose, Bld 94 65 - 99 mg/dL   BUN 14 6 - 20 mg/dL   Creatinine, Ser 1.38 (H) 0.61 - 1.24 mg/dL   Calcium 9.0 8.9 - 10.3 mg/dL   GFR calc non Af Amer 53 (L) >60 mL/min   GFR calc Af Amer >60 >60 mL/min    Comment: (NOTE) The eGFR has been calculated using the CKD EPI equation. This calculation has not been validated in all clinical situations. eGFR's persistently <60 mL/min signify possible Chronic Kidney Disease.    Anion gap 9 5 - 15    Comment: Performed at [facility] [address], [place], [place] [address]  CBC     Status: Abnormal  Collection Time: 08/22/17  5:41 AM  Result Value Ref Range   WBC 10.0 4.0 - 10.5 K/uL   RBC 4.92 4.22 - 5.81 MIL/uL   Hemoglobin 12.4 (L) 13.0 - 17.0 g/dL   HCT 38.9 (L) 39.0 - 52.0 %   MCV 79.1 78.0 - 100.0 fL   MCH 25.2 (L) 26.0 - 34.0 pg   MCHC 31.9 30.0 - 36.0 g/dL   RDW 16.2 (H) 11.5 - 15.5 %   Platelets 350 150 - 400 K/uL    Comment: Performed at [facility] [address], [place], [place] [address]  Protime-INR     Status: Abnormal   Collection Time: 08/22/17  5:41 AM  Result Value Ref Range   Prothrombin Time 20.6 (H) 11.4 - 15.2 seconds   INR 1.79     Comment: Performed at [facility] [address], [place], [place] [address]     HEENT: Right facial numbness no droop Cardio: RRR and no murmur Resp: CTA B/L and unlabored GI: BS positive and NT, ND Extremity:  No Edema Skin:   Intact Neuro: Alert/Oriented, Abnormal Sensory has Less  LT sensation in thoracic paraspinal than lumbar, reduced LT bilateral lower legs but not feet, Abnormal Motor 5/5 in B Delt, bi , tri grip , 4- R HF, 4 L HF 4- R KE, 4 Left 4/5 B ankle DF and Abnormal  FMC Ataxic/ dec FMC Right  heel/shin Musc/Skel:  Other no pain with UE or LE ROM Gen NAD   Assessment/Plan: 1. Functional deficits secondary to , left upper ext ataxia, gait disorder CVA  which require 3+ hours per day of interdisciplinary therapy in a comprehensive inpatient rehab setting. Physiatrist is providing close team supervision and 24 hour management of active medical problems listed below. Physiatrist and rehab team continue to assess barriers to discharge/monitor patient progress toward functional and medical goals. FIM: Function - Bathing Position: Shower Body parts bathed by patient: Right arm, Left arm, Chest, Abdomen, Right upper leg, Left upper leg, Right lower leg, Left lower leg, Front perineal area, Buttocks, Back Body parts bathed by helper: Buttocks, Back Assist Level: Supervision or verbal cues  Function- Upper Body Dressing/Undressing What is the patient wearing?: Pull over shirt/dress Pull over shirt/dress - Perfomed by patient: Thread/unthread right sleeve, Thread/unthread left sleeve, Put head through opening, Pull shirt over trunk Assist Level: Supervision or verbal cues, Set up Set up : To obtain clothing/put away Function - Lower Body Dressing/Undressing What is the patient wearing?: Underwear, Pants, Non-skid slipper socks Position: Wheelchair/chair at sink Underwear - Performed by patient: Thread/unthread right underwear leg, Thread/unthread left underwear leg, Pull underwear up/down Pants- Performed by patient: Thread/unthread right pants leg, Thread/unthread left pants leg, Pull pants up/down Non-skid slipper socks- Performed by patient: Don/doff right sock, Don/doff left sock Assist for footwear: Supervision/touching assist Assist for lower body dressing: Supervision or verbal cues  Function - Toileting Toileting steps completed by patient: Adjust clothing prior to toileting, Performs perineal hygiene, Adjust clothing after toileting Toileting steps completed by helper: Adjust  clothing prior to toileting, Adjust clothing after toileting Toileting Assistive Devices: Grab bar or rail Assist level: Touching or steadying assistance (Pt.75%)(supervision but lost balance and required steady)  Function - Air cabin crew transfer assistive device: Walker, Grab bar Assist level to toilet: Touching or steadying assistance (Pt > 75%) Assist level from toilet: Touching or steadying assistance (Pt > 75%)  Function - Chair/bed transfer Chair/bed transfer method: Stand pivot Chair/bed transfer  assist level: Touching or steadying assistance (Pt > 75%) Chair/bed transfer assistive device: Armrests, Walker Chair/bed transfer details: Verbal cues for technique, Verbal cues for precautions/safety, Verbal cues for safe use of DME/AE  Function - Locomotion: Wheelchair Will patient use wheelchair at discharge?: No Type: Manual Max wheelchair distance: 150 ft Assist Level: Supervision or verbal cues Assist Level: Supervision or verbal cues Assist Level: Supervision or verbal cues Turns around,maneuvers to table,bed, and toilet,negotiates 3% grade,maneuvers on rugs and over doorsills: No Function - Locomotion: Ambulation Assistive device: Walker-rolling Max distance: 80 ft Assist level: Touching or steadying assistance (Pt > 75%) Assist level: Touching or steadying assistance (Pt > 75%) Assist level: Touching or steadying assistance (Pt > 75%) Assist level: Touching or steadying assistance (Pt > 75%) Walk 10 feet on uneven surfaces activity did not occur: Safety/medical concerns  Function - Comprehension Comprehension: Auditory Comprehension assist level: Understands complex 90% of the time/cues 10% of the time  Function - Expression Expression: Verbal Expression assist level: Expresses basic needs/ideas: With extra time/assistive device  Function - Social Interaction Social Interaction assist level: Interacts appropriately 90% of the time - Needs monitoring or  encouragement for participation or interaction.  Function - Problem Solving Problem solving assist level: Solves complex 90% of the time/cues < 10% of the time  Function - Memory Memory assist level: More than reasonable amount of time, Recognizes or recalls 90% of the time/requires cueing < 10% of the time Patient normally able to recall (first 3 days only): That he or she is in a hospital, Location of own room, Current season, Staff names and faces  Medical Problem List and Plan: 1. Paraparesis lower extremity sensory changes and gait disorder likely had spinal cord ischemia related to AAA rupture and Aortic arch plaque and ulceration, also has had a small cerebellar infarcts left cervicomedullary jct infarct which may result in LLE weakness given that Corticospinal tracts have crossed Team conference today please see physician documentation under team conference tab, met with team face-to-face to discuss problems,progress, and goals. Formulized individual treatment plan based on medical history, underlying problem and comorbidities.  2. DVT Prophylaxis/Anticoagulation: Pharmaceutical:Coumadin and Lovenoxwith low dose ASA- Daily CBC per pharm protocol- Hgb improving 3. Pain Management:tylenol prn. Will add low dose gabapentin to help with dysesthesias. 4. Mood:LCSW to follow for evaluation and support. 5. Neuropsych: This patientiscapable of making decisions on hisown behalf. 6. Skin/Wound Care:Monitor wound for healing. Maintain adequate nutritional and hydration Status.  7. Fluids/Electrolytes/Nutrition:Monitor I/O-- lytes normal except mild elevation of creat, good meal intake I ~1679m, O ~1600, will d/c IVF and monitor 8. HTN: Monitor BP bid, controlled off meds  ,some lability , monitor Vitals:   08/21/17 2132 08/22/17 0502  BP: (!) 142/84 (!) 118/95  Pulse: 74 91  Resp: 18 20  Temp:  98.4 F (36.9 C)  SpO2: 100% 97%   9. Ruptured aneurysm s/p repair with  subsequent Endarterectomy: Monitor BP bid. Controlled at this time 10. CKD: Monitor for stability. Avoid nephrotoxic medication. Baseline SCr 1.3- at baseline 11. Dyslipidemia: on Lipitor 12. Leucocytosis:resolved, . Completed antibiotic regimen on 08/12/17     LOS (Days) 4 A FACE TO FACE EVALUATION WAS PERFORMED  ACharlett Blake5/29/2019, 9:21 AM

## 2017-08-22 NOTE — Progress Notes (Signed)
Occupational Therapy Session Note  Patient Details  Name: Thomas [name] MRN: [id number] Date of Birth: 1956-02-21  Today's Date: 08/22/2017 OT Individual Time: 0930-1030 OT Individual Time Calculation (min): 60 min    Short Term Goals: Week 1:  OT Short Term Goal 1 (Week 1): Pt will perform bathing at shower level with supervision for safety and balance.   OT Short Term Goal 2 (Week 1): Pt will attend to right side of body and environment in functional tasks with 1 verbal cue.   OT Short Term Goal 3 (Week 1): Pt will utilize correct hand placement with no verbal cues.    Skilled Therapeutic Interventions/Progress Updates:    Pt seen for OT session focusing on functioanl ambulation, activity tolerance, functional transfers and LE strengthening. Pt in supine upon arrival, stated he had dizziness episode eariler in AM, however, reports feeling betterand agreeable to tx session. He denied bathing/dressing session this AM. He ambulated to ADL apartment with CGA, one episode of LOB when turning to sit at en of walk, requiring cues for RW management and safety awareness. In ADL apartment, completed simulated tub/shower transfer. First trial completed with use of tub transfer bench, completed with guarding assist following demonstration for technique. He completed second trial stepping over tub wall with min A to enter, mod to exit. Recommending tub transfer bench at home as pt stating he will likely complete bathing task at [place] I level.  In therapy gym, completed x3 sets of 10 sit>stand without UE support with therapy mat being lowered on subsequent trials, all completed with close supervision.  Returned to supine, completed x10 glute raises using B LEs x5 glute raises using R LE and x5 using [name] LE. In side-lying, completed "clam shells" hip abduction x10 on R/[name]. Pt ambulated back to room at end of session, completed toileting task with steadying assist. Pt returned to supine at end of session,  left in supine with bed alarm on and RN entering.   Therapy Documentation Precautions:  Precautions Precautions: Fall Restrictions Weight Bearing Restrictions: No Pain:   No/denies pain  See Function Navigator for Current Functional Status.   Therapy/Group: Individual Therapy  Thomas [name] 08/22/2017, 7:09 AM

## 2017-08-22 NOTE — Progress Notes (Signed)
Speech Language Pathology Daily Session Note  Patient Details  Name: Thomas [name] MRN: [id number] Date of Birth: [birth year]  Today's Date: 08/22/2017 SLP Individual Time: 1030-1314 SLP Individual Time Calculation (min): 45 min  Short Term Goals: Week 1: SLP Short Term Goal 1 (Week 1): Pt will anticipate barriers to discharge and generate appropriate soultions at Mod I.  SLP Short Term Goal 2 (Week 1): Pt will complete semi-complex tasks at Mod I for functional problem solving.  SLP Short Term Goal 3 (Week 1): Pt will self-monitor and correct functional errors in semi-complex tasks at [place] services focused on cognitive skills. SLP facilitated semi-complex problem solving skills and error awareness with personal medication management task , pt required initial supervision A verbal cues fading to Mod I. SLP facilitated semi-complex problem solving and error awareness during check balancing task, pt demonstrated Mod I. Pt was left in room with call bell within reach. Recommend to continue skilled ST services.      Function:  Eating Eating                 Cognition Comprehension Comprehension assist level: Follows basic conversation/direction with no assist  Expression   Expression assist level: Expresses basic needs/ideas: With extra time/assistive device  Social Interaction Social Interaction assist level: Interacts appropriately 90% of the time - Needs monitoring or encouragement for participation or interaction.  Problem Solving Problem solving assist level: Solves complex 90% of the time/cues < 10% of the time  Memory Memory assist level: More than reasonable amount of time    Pain Pain Assessment Pain Score: 0-No pain Faces Pain Scale: Hurts a little bit  Therapy/Group: Individual Therapy  Ritamarie Arkin  [facility] 08/22/2017, 11:36 AM

## 2017-08-22 NOTE — Progress Notes (Signed)
Physical Therapy Session Note  Patient Details  Name: Thomas [name] MRN: [id number] Date of Birth: 1955-05-26  Today's Date: 08/22/2017 PT Individual Time: 1108-1201 and [id number] PT Individual Time Calculation (min): 53 min and 24 min   Short Term Goals: Week 1:  PT Short Term Goal 1 (Week 1): Pt will ascend/descend 4 steps with AD and no handrails, min assist PT Short Term Goal 2 (Week 1): Pt will maintain dynamic standing balance without UE support, min assist PT Short Term Goal 3 (Week 1): Pt will perform bed<>w/c transfers with supervision   Skilled Therapeutic Interventions/Progress Updates:  Treatment 1: Pt received in bed with LCSW present but exiting shortly upon PT arrival. Pt agreeable to tx and reports his cousin plans to install B rails (pt can access both simultaneously) at back door prior to pt's d/c. Pt dons tennis shoes with mod I while in bed. Pt ambulates room>gym with RW and min assist with significant R hip weakness and impaired coordination. Provided pt with R foot-up brace and trialed it with improvement in R heel strike. Pt transitioned to quadruped on mat table and performed alternating LE lifts to focus on LE & core strengthening & NMR. Pt then engaged in tall kneeling x 5 minutes while engaging in linear puzzle activity (pt with great difficulty error correcting & problem solving) with focus on BLE strengthening & NMR. Pt performed standing cone taps with max assist for balance with task focusing on coordination, RLE hip flexion strengthening, and increased neuromuscular control. Pt with significant R hip weakness limiting his flexion, impaired coordination, and decreased strength when weight bearing through RLE with therapist providing assist to correct frequent LOB. Pt ambulates back to room with noticeable BLE weakness compared to beginning of session. At end of session pt left sitting in bed with alarm set & needs within reach. Throughout session therapist provides cuing for  hand placement for increased safety during sit<>stand transfers with minimal awareness & carryover observed.  Treatment 2: Pt received asleep in bed but easily awakened & agreeable to tx. No c/o pain reported. Pt transfers to EOB & dons tennis shoes without assistance, with therapist only providing supervision cuing for donning R foot-up brace. Pt ambulates within room & bathroom with RW & min assist with R lateral lean and decreased hip flexion & toe clearance RLE, with decreased step length BLE. Pt c/o more dizziness today and reports awareness of impaired balance. Pt with continent void on toilet and performed hand hygiene standing at sink with steady assist for balance. Pt performed 2 sets x 10 reps of sit<>stand transfers without BUE support from descending seat height with task focusing on BLE strengthening and close supervision. Pt then performed standing BLE hip flexion exercises with RW for support and task focusing on strengthening. Pt continues to demonstrate poor awareness & carryover of instructions for hand placement for sit<>stand transfers. At end of session pt left in bed with alarm set & all needs within reach.    Therapy Documentation Precautions:  Precautions Precautions: Fall Restrictions Weight Bearing Restrictions: No   See Function Navigator for Current Functional Status.   Therapy/Group: Individual Therapy  [name] 08/22/2017, 3:27 PM

## 2017-08-22 NOTE — Plan of Care (Signed)
  Problem: Consults Goal: RH STROKE PATIENT EDUCATION Description See Patient Education module for education specifics  Outcome: Progressing Goal: Nutrition Consult-if indicated Outcome: Progressing   Problem: RH SKIN INTEGRITY Goal: RH STG SKIN FREE OF INFECTION/BREAKDOWN Description Skin to remain free from breakdown while on rehab with supervision from staff.  Outcome: Progressing Goal: RH STG MAINTAIN SKIN INTEGRITY WITH ASSISTANCE Description STG Maintain Skin Integrity With Supervision Assistance.  Outcome: Progressing   Problem: RH SAFETY Goal: RH STG ADHERE TO SAFETY PRECAUTIONS W/ASSISTANCE/DEVICE Description STG Adhere to Safety Precautions With min Assistance and appropriate assistive Device.  Outcome: Progressing Goal: RH STG DECREASED RISK OF FALL WITH ASSISTANCE Description STG Decreased Risk of Fall With min Assistance.  Outcome: Progressing   Problem: RH PAIN MANAGEMENT Goal: RH STG PAIN MANAGED AT OR BELOW PT'S PAIN GOAL Description <3 on a 0-10 pain scale  Outcome: Progressing   Problem: RH KNOWLEDGE DEFICIT Goal: RH STG INCREASE KNOWLEDGE OF HYPERTENSION Description Patient will be able to demonstrate knowledge of HTN medications, dietary restrictions, and follow up care with the MD with min assist from rehab staff.  Outcome: Progressing   Problem: RH Vision Goal: RH LTG Vision (Specify) Outcome: Progressing

## 2017-08-23 ENCOUNTER — Other Ambulatory Visit: Payer: Private Health Insurance - Indemnity

## 2017-08-23 ENCOUNTER — Inpatient Hospital Stay (HOSPITAL_COMMUNITY): Payer: Private Health Insurance - Indemnity | Admitting: Speech Pathology

## 2017-08-23 ENCOUNTER — Inpatient Hospital Stay (HOSPITAL_COMMUNITY): Payer: Private Health Insurance - Indemnity | Admitting: Occupational Therapy

## 2017-08-23 ENCOUNTER — Inpatient Hospital Stay (HOSPITAL_COMMUNITY): Payer: Private Health Insurance - Indemnity

## 2017-08-23 LAB — CBC
HCT: 38.6 % — ABNORMAL LOW (ref 39.0–52.0)
Hemoglobin: 12.4 g/dL — ABNORMAL LOW (ref 13.0–17.0)
MCH: 25.2 pg — AB (ref 26.0–34.0)
MCHC: 32.1 g/dL (ref 30.0–36.0)
MCV: 78.5 fL (ref 78.0–100.0)
Platelets: 359 10*3/uL (ref 150–400)
RBC: 4.92 MIL/uL (ref 4.22–5.81)
RDW: 16.1 % — AB (ref 11.5–15.5)
WBC: 10 10*3/uL (ref 4.0–10.5)

## 2017-08-23 LAB — PROTIME-INR
INR: 1.99
PROTHROMBIN TIME: 22.4 s — AB (ref 11.4–15.2)

## 2017-08-23 MED ORDER — WARFARIN SODIUM 5 MG PO TABS
5.0000 mg | ORAL_TABLET | Freq: Once | ORAL | Status: AC
  Administered 2017-08-23: 5 mg via ORAL
  Filled 2017-08-23: qty 1

## 2017-08-23 MED ORDER — TRAZODONE HCL 50 MG PO TABS
25.0000 mg | ORAL_TABLET | Freq: Every day | ORAL | Status: DC
  Administered 2017-08-23: 25 mg via ORAL
  Filled 2017-08-23: qty 1

## 2017-08-23 NOTE — Progress Notes (Signed)
Subjective/Complaints: Slept poorly, tried sleeping pill a couple nights ago but caused some hangover effect  Review of systems denies chest pain shortness of breath nausea vomiting diarrhea or constipation Objective: Vital Signs: Blood pressure 135/90, pulse 72, temperature 98.2 F (36.8 C), resp. rate 18, height '5\' 7"'$  (1.702 m), weight 53.8 kg (118 lb 9.7 oz), SpO2 100 %. No results found. Results for orders placed or performed during the hospital encounter of 08/18/17 (from the past 72 hour(s))  CBC     Status: Abnormal   Collection Time: 08/21/17  5:10 AM  Result Value Ref Range   WBC 10.9 (H) 4.0 - 10.5 K/uL   RBC 4.73 4.22 - 5.81 MIL/uL   Hemoglobin 12.0 (L) 13.0 - 17.0 g/dL   HCT 37.2 (L) 39.0 - 52.0 %   MCV 78.6 78.0 - 100.0 fL   MCH 25.4 (L) 26.0 - 34.0 pg   MCHC 32.3 30.0 - 36.0 g/dL   RDW 15.9 (H) 11.5 - 15.5 %   Platelets 361 150 - 400 K/uL    Comment: Performed at [facility], [place] [address], [place], [place] [address]  Protime-INR     Status: Abnormal   Collection Time: 08/21/17  5:10 AM  Result Value Ref Range   Prothrombin Time 19.2 (H) 11.4 - 15.2 seconds   INR 1.63     Comment: Performed at [facility] [address], [place], [place] [address]  Basic metabolic panel     Status: Abnormal   Collection Time: 08/21/17  5:10 AM  Result Value Ref Range   Sodium 141 135 - 145 mmol/L   Potassium 3.6 3.5 - 5.1 mmol/L   Chloride 107 101 - 111 mmol/L   CO2 25 22 - 32 mmol/L   Glucose, Bld 94 65 - 99 mg/dL   BUN 14 6 - 20 mg/dL   Creatinine, Ser 1.38 (H) 0.61 - 1.24 mg/dL   Calcium 9.0 8.9 - 10.3 mg/dL   GFR calc non Af Amer 53 (L) >60 mL/min   GFR calc Af Amer >60 >60 mL/min    Comment: (NOTE) The eGFR has been calculated using the CKD EPI equation. This calculation has not been validated in all clinical situations. eGFR's persistently <60 mL/min signify possible Chronic Kidney Disease.    Anion gap 9 5 - 15    Comment: Performed at [facility] [address] [place], [place] [address]  CBC     Status: Abnormal   Collection Time: 08/22/17  5:41 AM  Result Value Ref Range   WBC 10.0 4.0 - 10.5 K/uL   RBC 4.92 4.22 - 5.81 MIL/uL   Hemoglobin 12.4 (L) 13.0 - 17.0 g/dL   HCT 38.9 (L) 39.0 - 52.0 %   MCV 79.1 78.0 - 100.0 fL   MCH 25.2 (L) 26.0 - 34.0 pg   MCHC 31.9 30.0 - 36.0 g/dL   RDW 16.2 (H) 11.5 - 15.5 %   Platelets 350 150 - 400 K/uL    Comment: Performed at [facility] [address], [place], [place] [address]  Protime-INR     Status: Abnormal   Collection Time: 08/22/17  5:41 AM  Result Value Ref Range   Prothrombin Time 20.6 (H) 11.4 - 15.2 seconds   INR 1.79     Comment: Performed at [facility] [address], [place], [place] [address]  Protime-INR     Status: Abnormal   Collection Time: 08/23/17  6:58 AM  Result Value Ref Range   Prothrombin Time 22.4 (H) 11.4 - 15.2 seconds   INR 1.99     Comment: Performed at [facility] [address], [place], [place] [address]  CBC     Status: Abnormal   Collection Time: 08/23/17  6:58 AM  Result Value Ref Range   WBC 10.0 4.0 - 10.5 K/uL   RBC 4.92 4.22 - 5.81 MIL/uL   Hemoglobin 12.4 (L) 13.0 - 17.0 g/dL   HCT 38.6 (L) 39.0 - 52.0 %   MCV 78.5 78.0 - 100.0 fL   MCH 25.2 (L) 26.0 - 34.0 pg   MCHC 32.1 30.0 - 36.0 g/dL   RDW 16.1 (H) 11.5 - 15.5 %   Platelets 359 150 - 400 K/uL    Comment: Performed at [facility], [place] [address] [place], [place] [address]     HEENT: Right facial numbness no droop Cardio: RRR and no murmur Resp: CTA B/L and unlabored GI: BS positive and NT, ND Extremity:  No Edema Skin:   Intact Neuro: Alert/Oriented, Abnormal Sensory has Less  LT sensation in thoracic paraspinal than lumbar, reduced LT bilateral lower legs but not feet, Abnormal Motor 5/5 in B Delt, bi , tri grip , 4- R HF, 4 L HF 4- R KE, 4 Left 4/5 B ankle DF and Abnormal  FMC Ataxic/ dec FMC Right heel/shin Musc/Skel:  Other no  pain with UE or LE ROM Gen NAD   Assessment/Plan: 1. Functional deficits secondary to , left upper ext ataxia, gait disorder CVA  which require 3+ hours per day of interdisciplinary therapy in a comprehensive inpatient rehab setting. Physiatrist is providing close team supervision and 24 hour management of active medical problems listed below. Physiatrist and rehab team continue to assess barriers to discharge/monitor patient progress toward functional and medical goals. FIM: Function - Bathing Position: Shower Body parts bathed by patient: Right arm, Left arm, Chest, Abdomen, Right upper leg, Left upper leg, Right lower leg, Left lower leg, Front perineal area, Buttocks, Back Body parts bathed by helper: Buttocks, Back Assist Level: Supervision or verbal cues  Function- Upper Body Dressing/Undressing What is the patient wearing?: Pull over shirt/dress Pull over shirt/dress - Perfomed by patient: Thread/unthread right sleeve, Thread/unthread left sleeve, Put head through opening, Pull shirt over trunk Assist Level: Supervision or verbal cues, Set up Set up : To obtain clothing/put away Function - Lower Body Dressing/Undressing What is the patient wearing?: Underwear, Pants, Non-skid slipper socks Position: Wheelchair/chair at sink Underwear - Performed by patient: Thread/unthread right underwear leg, Thread/unthread left underwear leg, Pull underwear up/down Pants- Performed by patient: Thread/unthread right pants leg, Thread/unthread left pants leg, Pull pants up/down Non-skid slipper socks- Performed by patient: Don/doff right sock, Don/doff left sock Assist for footwear: Supervision/touching assist Assist for lower body dressing: Supervision or verbal cues  Function - Toileting Toileting steps completed by patient: Performs perineal hygiene, Adjust clothing prior to toileting, Adjust clothing after toileting Toileting steps completed by helper: Adjust clothing prior to toileting,  Adjust clothing after toileting Toileting Assistive Devices: Other (comment)(RW) Assist level: Touching or steadying assistance (Pt.75%)  Function - Toilet Transfers Toilet transfer assistive device: Walker, Elevated toilet seat/BSC over toilet Assist level to toilet: Supervision or verbal cues Assist level from toilet: Supervision or verbal cues  Function - Chair/bed transfer Chair/bed transfer method: Stand pivot Chair/bed transfer assist level: Touching or steadying assistance (Pt > 75%) Chair/bed transfer assistive device: Armrests, Gilford Rile  Chair/bed transfer details: Verbal cues for technique, Verbal cues for precautions/safety, Verbal cues for safe use of DME/AE  Function - Locomotion: Wheelchair Will patient use wheelchair at discharge?: No Type: Manual Max wheelchair distance: 150 ft Assist Level: Supervision or verbal cues Assist Level: Supervision or verbal cues Assist Level: Supervision or verbal cues Turns around,maneuvers to table,bed, and toilet,negotiates 3% grade,maneuvers on rugs and over doorsills: No Function - Locomotion: Ambulation Assistive device: Walker-rolling(R foot up brace) Max distance: 15 ft  Assist level: Touching or steadying assistance (Pt > 75%) Assist level: Touching or steadying assistance (Pt > 75%) Assist level: Touching or steadying assistance (Pt > 75%) Assist level: Touching or steadying assistance (Pt > 75%) Walk 10 feet on uneven surfaces activity did not occur: Safety/medical concerns  Function - Comprehension Comprehension: Auditory Comprehension assist level: Follows basic conversation/direction with no assist  Function - Expression Expression: Verbal Expression assist level: Expresses basic needs/ideas: With extra time/assistive device  Function - Social Interaction Social Interaction assist level: Interacts appropriately 90% of the time - Needs monitoring or encouragement for participation or interaction.  Function - Problem  Solving Problem solving assist level: Solves complex 90% of the time/cues < 10% of the time  Function - Memory Memory assist level: More than reasonable amount of time Patient normally able to recall (first 3 days only): That he or she is in a hospital, Location of own room, Current season, Staff names and faces  Medical Problem List and Plan: 1. Paraparesis lower extremity sensory changes and gait disorder likely had spinal cord ischemia related to AAA rupture and Aortic arch plaque and ulceration, also has had a small cerebellar infarcts left cervicomedullary jct infarct which may result in LLE weakness given that Corticospinal tracts have crossed Cont CIR PT, OT,SLP  2. DVT Prophylaxis/Anticoagulation: Pharmaceutical:Coumadin and Lovenoxwith low dose ASA- Daily CBC per pharm protocol- INR near goal of 2.0 (1.99) 3. Pain Management:tylenol prn. Will add low dose gabapentin to help with dysesthesias. 4. Mood:LCSW to follow for evaluation and support. 5. Neuropsych: This patientiscapable of making decisions on hisown behalf. 6. Skin/Wound Care:Monitor wound for healing. Maintain adequate nutritional and hydration Status.  7. Fluids/Electrolytes/Nutrition:Monitor I/O-- lytes normal except mild elevation of creat, good meal intake I ~1663m, O ~1600, will d/c IVF and monitor 8. HTN: Monitor BP bid, controlled off meds  ,diastolic mildly elevated Vitals:   08/22/17 2050 08/23/17 0411  BP: (!) 133/99 135/90  Pulse: 72 72  Resp:  18  Temp: 98 F (36.7 C) 98.2 F (36.8 C)  SpO2: 97% 100%   9. Ruptured aneurysm s/p repair with subsequent Endarterectomy: Monitor BP bid. Controlled at this time 10. CKD: Monitor for stability. Avoid nephrotoxic medication. Baseline SCr 1.3- at baseline 11. Dyslipidemia: on Lipitor- No muscle pain 12. Leucocytosis:resolved, . Completed antibiotic regimen on 08/12/17     LOS (Days) 5 A FACE TO FACE EVALUATION WAS PERFORMED  ACharlett Blake5/30/2019, 8:57 AM

## 2017-08-23 NOTE — Progress Notes (Signed)
Speech Language Pathology Daily Session Note  Patient Details  Name: Thomas [name] MRN: [id number] Date of Birth: [birth year]  Today's Date: 08/23/2017 SLP Individual Time: 1030-1100 SLP Individual Time Calculation (min): 30 min  Short Term Goals: Week 1: SLP Short Term Goal 1 (Week 1): Pt will anticipate barriers to discharge and generate appropriate soultions at Mod I.  SLP Short Term Goal 2 (Week 1): Pt will complete semi-complex tasks at Mod I for functional problem solving.  SLP Short Term Goal 3 (Week 1): Pt will self-monitor and correct functional errors in semi-complex tasks at Mod I.   Skilled Therapeutic Interventions: Skilled treatment session focused on cognition goals. SLP facilitated session by providing Min A faded to supervision cues to problem solve novel card game played in it's simplistic form. Pt with increased able to correct errors when given more than a reasonable amount of time. Pt was handed off to PT.      Function:    Cognition Comprehension Comprehension assist level: Follows basic conversation/direction with no assist  Expression   Expression assist level: Expresses basic needs/ideas: With extra time/assistive device  Social Interaction Social Interaction assist level: Interacts appropriately 90% of the time - Needs monitoring or encouragement for participation or interaction.  Problem Solving Problem solving assist level: Solves basic problems with no assist  Memory Memory assist level: More than reasonable amount of time;Requires cues to use assistive device    Pain    Therapy/Group: Individual Therapy  Tayler Heiden 08/23/2017, 12:30 PM

## 2017-08-23 NOTE — Evaluation (Addendum)
Recreational Therapy Assessment and Plan  Patient Details  Name: Thomas [name] MRN: [id number] Date of Birth: 1955-09-27 Today's Date: 08/23/2017  Rehab Potential:  Good  ELOS:   discharge 6/5  Assessment  Problem List:      Patient Active Problem List   Diagnosis Date Noted  . Chronic ischemic vertebrobasilar artery cerebellar stroke 08/18/2017  . Cerebellar artery embolism   . Gait disorder   . Cerebral embolism with cerebral infarction 08/16/2017  . Paraparesis ([place])   . Hypertension 08/15/2017  . Gait instability 08/15/2017  . Pelvic pain 08/15/2017  . Chronic kidney disease 08/15/2017  . Abnormal CT scan, pelvis 08/15/2017  . Meniere disease, left 08/08/2017  . Sepsis due to Streptococcus pneumoniae ([place])   . Acute respiratory failure with hypoxia ([place])   . Hemorrhagic shock ([place]) 07/02/2017  . Acute blood loss anemia 07/02/2017  . S/P AAA repair   . LLQ abdominal pain   . Ruptured abdominal aortic aneurysm (AAA) ([place])   . Hypotension   . Bacteremia due to Streptococcus 06/30/2017  . CVA (cerebral vascular accident) ([place]) 06/30/2017  . Chest pain 06/28/2017  . CAP (community acquired pneumonia): Clinical 06/28/2017  . Acute renal failure superimposed on stage 3 chronic kidney disease ([place]) 06/28/2017  . Dehydration 06/28/2017  . Leukocytosis 06/28/2017  . Elevated troponin 06/28/2017  . Dizziness 06/28/2017  . Vertigo 06/28/2017  . Acute pericarditis: Probable 06/28/2017    Past Medical History:      Past Medical History:  Diagnosis Date  . Hypertension   . Stroke [facility])    Past Surgical History:       Past Surgical History:  Procedure Laterality Date  . ENDOVASCULAR STENT INSERTION N/A 07/01/2017   Procedure: ENDOVASCULAR STENT GRAFT INSERTION;  Surgeon: Serafina Mitchell, MD;  [facility] OR;  Service: Vascular;  Laterality: N/A;  . THROMBECTOMY FEMORAL ARTERY Bilateral 07/01/2017   Procedure: Bilateral IlioFemoral Thrombectomies;  Surgeon:  Serafina Mitchell, MD;  Location: [facility] OR;  Service: Vascular;  Laterality: Bilateral;    Assessment & Plan Clinical Impression: Patient is a 62 y.o. year old male with history of CKD,ruptured aneurysm status post bilateral iliofemoral endarterectomy with thromboembolectomy 086 complicated by 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 He completed his IV antibiotic close on 519 however was admitted on 5 /22/19with 3-day history of bilateral lower extremity weakness with abdominal and bilateral groin pain. CT abdomen pelvis done showed no evidence of leak and intense mural enhancement felt to be due to postop changes per Dr. Gwenlyn Saran. MRI of brain was repeated and showed acute subcentimeter infarcts bilateral cerebellar and right cervical medullary junction as well as slow flow versus occluded right-VA. Neurology was consulted for input and recommended DAPT with ECHO/TEE to rule out cardioembolic source. 2D echo done revealing EF 65-70% with moderate AVR.CTA chest 5/24 revealing 4.5 cm thoracic aortic aneurysm with soft plaque and minimal ulcerative plaque and bilateral centrilobar/paraseptal emphysema. Dr. Erlinda Hong recommended lovenox bridge with coumadin and low dose ASA for extensive soft plaque likely cause of stroke. To repeat CTA aorta in 3 months to decided on further course of anticoagulation or antiplatelet.Therapy evaluationsdone revealing functional deficits and CIR recommended for follow up therapy.  Patient transferred to CIR on 08/18/2017.   Met with pt today to discuss TR services.  Pt stated he was ready to go home and wanting to get back to walking/exercising  as he was PTA.  Reviewed safety concerns and need for someone to walk with him.  Pt stated people would be around in the evening when it was cooler and he would wait until then.  Pt  mostly discussed concern about lack of long term insurance coverage due to his inability to work and that he would be terminated due to his absence per his company policy.  Pt stated SW was aware and helping to provide community resources.    Plan No further TR due to LOS Recommendations for other services: None   Discharge Criteria: Patient will be discharged from TR if patient refuses treatment 3 consecutive times without medical reason.  If treatment goals not met, if there is a change in medical status, if patient makes no progress towards goals or if patient is discharged from hospital.  The above assessment, treatment plan, treatment alternatives and goals were discussed and mutually agreed upon: by patient  [place] 08/23/2017, 11:04 AM

## 2017-08-23 NOTE — Plan of Care (Signed)
Nutrition Education Note  RD consulted for nutrition education regarding a Heart Healthy diet.   Lipid Panel     Component Value Date/Time   CHOL 174 08/16/2017 0625   TRIG 94 08/16/2017 0625   HDL 40 (L) 08/16/2017 0625   CHOLHDL 4.4 08/16/2017 0625   VLDL 19 08/16/2017 0625   LDLCALC 115 (H) 08/16/2017 0625    RD provided "Heart Healthy Nutrition Therapy" handout from the Academy of [employer]. Reviewed patient's dietary recall. Provided examples on ways to decrease sodium and fat intake in diet. Discouraged intake of processed foods and use of salt shaker. Encouraged fresh fruits and vegetables as well as whole grain sources of carbohydrates to maximize fiber intake. Teach back method used.  Expect good compliance.  Body mass index is 18.58 kg/m. Pt meets criteria for normal based on current BMI.  Current diet order is heart healthy, patient is consuming approximately 75-100% of meals at this time. Labs and medications reviewed. No further nutrition interventions warranted at this time. RD contact information provided. If additional nutrition issues arise, please re-consult RD.  Corrin Parker, MS, RD, LDN Pager # [phone] After hours/ weekend pager # [phone]

## 2017-08-23 NOTE — Discharge Summary (Signed)
Physician Discharge Summary  Patient ID: Thomas [name] MRN: [id number] DOB/AGE: 06-30-1955 62 y.o.  Admit date: 08/18/2017 Discharge date: 08/29/2017  Discharge Diagnoses:  Principal Problem:   Cerebellar artery embolism Active Problems:   Dizziness   Hypertension   Gait instability   Chronic kidney disease   Chronic ischemic vertebrobasilar artery cerebellar stroke   Gait disorder   Chronic anticoagulation   Thoracic aortic aneurysm without rupture [facility])   Discharged Condition: stable   Significant Diagnostic Studies: N/A   Labs:  Basic Metabolic Panel: Recent Labs  Lab 08/17/17 0440 08/18/17 0139 08/19/17 0448 08/21/17 0510  NA 139 138 140 141  K 4.4 4.3 3.6 3.6  CL 105 105 108 107  CO2 26 25 26 25   GLUCOSE 90 98 91 94  BUN 16 19 13 14   CREATININE 1.39* 1.47* 1.30* 1.38*  CALCIUM 9.0 9.0 9.0 9.0  MG 1.8 1.8  --   --     CBC: Recent Labs  Lab 08/19/17 0448  08/21/17 0510 08/22/17 0541 08/23/17 0658  WBC 10.8*   < > 10.9* 10.0 10.0  NEUTROABS 4.2  --   --   --   --   HGB 12.3*   < > 12.0* 12.4* 12.4*  HCT 37.7*   < > 37.2* 38.9* 38.6*  MCV 77.3*   < > 78.6 79.1 78.5  PLT 364   < > 361 350 359   < > = values in this interval not displayed.    CBG: No results for input(s): GLUCAP in the last 168 hours.  Brief HPI:   Thomas [name] is a 62 year old male with history of CKD, ruptured aneurysm status post bilateral iliofemoral endarterectomy with thromboembolectomy 7/20-94 complicated by right iliac in-stent thrombosis right lower extremity numbness and mild right foot drop left cerebellar infarcts and pneumococcal bacteremia with probable pericarditis.  He was discharged to home and had completed his IV antibiotic course however was readmitted 08/15/2017 with 3-day history of bilateral lower extremity weakness as well as abdominal and groin pain.  CT abdomen pelvis showed no evidence of leak or infection.  MRI of brain showed acute subcentimeter infarcts bilateral  cerebellar and right cervical medullary junction as well as slow flow  V/S occluded R-VA.   CTA chest done revealing 4.5 cm thoracic aortic aneurysm but soft plaque and minimal ulcerative plaque and bilateral centrilobular lobe bulla/paraseptal emphysema.  Dr. Erlinda Hong felt that sroke was embolic due to extensive soft plaque  and recommended Lovenox bridge to Coumadin as well as low-dose aspirin. Patient continued to have weakness with functional deficits and CIR was recommended for follow up therapy.   Hospital Course: Thomas [name] was admitted to rehab 08/18/2017 for inpatient therapies to consist of PT and OT at least three hours five days a week. Past admission physiatrist, therapy team and rehab RN have worked together to provide customized collaborative inpatient rehab. MRI lumbar spine done due to reports of numbness and paraplegia. This was negative for HNP, stenosis, lesion or spinal cord infarct.  He was cross covered with treatment dose lovenox till INR was therapeutic. Blood pressures have been stable. he has had issues with insomnia but was unable to tolerate low dose trazodone due to excessive sedation. CT head was repeated on 5/31 due to lethargy and showed no acute changes or hemorrhage.   Coumadin is therapeutic at discharge with INR 2.33 and he was discharged on 5 mg daily. INR is to be rechecked on June 10th with goal of  2-3.  Low dose gabapentin was added to help manage dysesthesias. Acute on chronic dizziness is improving.  Follow up CBC showed that H/H is table and mild leucocytosis continues without signs of infection. Po intake has been good and he is continent of bowel and bladder. He has made good gains during his stay and but requires supervision for safety due to balance deficits. He was fitted with right foot up brace and advised to use wheelchair when alone as family is unable to provide 24 hours supervision. Follow up therapy is not covered by his insurance and he was educated on HEP as  well as fall prevention measures. [facility].    Rehab course: During patient's stay in rehab weekly team conferences were held to monitor patient's progress, set goals and discuss barriers to discharge. At admission, patient required min assist with mobility and mod assist with ADL tasks.  He  has had improvement in activity tolerance, balance, postural control as well as ability to compensate for deficits. He has had improvement in functional use LUE  and RLE/LLE as well as improvement in awareness. He is able to complete ADL tasks with supervision. He is able to transfer at modified independent level and is able to ambulate 150' with RW and supervision. Family education was completed regarding all aspects of care and safety.     Disposition:  Home   Diet: Heart healthy.   Special Instructions: 1. Needs supervision with bathing and when ambulating. 2. Needs PT/INR checked  09/03/17. INR goal 2-3 range.  3. Appointment with Dr. Roxan Hockey on 10/02/17 at 2:30 pm  Discharge Instructions    Ambulatory referral to Cardiothoracic Surgery   Complete by:  As directed    Thoracic aneurysm on CT. Needs follow up for evaluation/monitoring. Recent ruptured AAA. To be d/c 6/4   Ambulatory referral to Physical Medicine Rehab   Complete by:  As directed    1-2 weeks transitional care appt     Allergies as of 08/29/2017   No Known Allergies     Medication List    STOP taking these medications   docusate sodium 100 MG capsule Commonly known as:  COLACE     TAKE these medications   acetaminophen 500 MG tablet Commonly known as:  TYLENOL Take 500 mg by mouth every 6 (six) hours as needed (for pain or headaches).   aspirin 81 MG EC tablet Take 1 tablet (81 mg total) by mouth daily.   atorvastatin 80 MG tablet Commonly known as:  LIPITOR Take 1 tablet (80 mg total) by mouth daily at 6 PM.   gabapentin 100 MG  capsule Commonly known as:  NEURONTIN Take 1 capsule (100 mg total) by mouth at bedtime.   pantoprazole 40 MG tablet Commonly known as:  PROTONIX Take 1 tablet (40 mg total) by mouth daily.   warfarin 5 MG tablet Commonly known as:  COUMADIN Take 1 tablet (5 mg total) by mouth daily at 6 PM.      Follow-up Information    Kirsteins, Luanna Salk, MD Follow up.   Specialty:  Physical Medicine and Rehabilitation Why:  office to call you fro follow up appointment Contact information: [place] [address] [phone]        Rosalin Hawking, MD. Call in 1 day(s).   Specialty:  Neurology Why:  for follow up appointment Contact information: [place]  [address] [place] [address] [phone]        Waynetta Sandy, MD Follow up.   Specialties:  Vascular Surgery, Cardiology Contact information: [address] [place] [address] [phone]        Brayton Caves, [place]. Go on 09/03/2017.   Specialty:  Physician Assistant Why:  @ 9AM.  Needs protime checked and coumadin dose adjusted if needed.  You can also go there on the day of discharge to get your prescriptions filled. Contact information: [address] [place] [address] [phone]           Signed: Bary Leriche 08/29/2017, 5:56 PM

## 2017-08-23 NOTE — Progress Notes (Signed)
Occupational Therapy Session Note  Patient Details  Name: Thomas [name] MRN: [id number] Date of Birth: 10/19/1955  Today's Date: 08/23/2017 OT Individual Time: 6269-4854 OT Individual Time Calculation (min): 60 min    Short Term Goals: Week 1:  OT Short Term Goal 1 (Week 1): Pt will perform bathing at shower level with supervision for safety and balance.   OT Short Term Goal 2 (Week 1): Pt will attend to right side of body and environment in functional tasks with 1 verbal cue.   OT Short Term Goal 3 (Week 1): Pt will utilize correct hand placement with no verbal cues.    Skilled Therapeutic Interventions/Progress Updates:    Pt seen for OT ADL bathing/dressing session. Pt sitting up in bed upon arrival, denying pain and agreeable to tx session. He ambulated throughout session with close supervision- guarding assist, cont to require VCs for proper RW management in functional context and safety awareness with mobility. He bathed seated on tub bench, standing with use of grab bars to complete pericare/buttock hygiene. He returned to toilet to dress, standing with RW to pull pants up. He completed grooming tasks standing at sink with distant supervision.  He ambulated throughout unit with guarding assist, VCs to listen UE reliance. 1 LOB episode when turning to sit in standard chair, pt with delayed righting reactions to correct. He completed laundry task  With close supervision and cuing for instructions of machine.  Completed seated LE strengthening exercises seated EOM, x2 sets of 10 knee raises, LARQ, and glute squeezes. Pt returned to room at end of session and requesting return to supine, left with all needs in reach and bed alarm on.   Therapy Documentation Precautions:  Precautions Precautions: Fall Restrictions Weight Bearing Restrictions: No Pain:   No/denies pain  See Function Navigator for Current Functional Status.   Therapy/Group: Individual Therapy  Jacynda Brunke L 08/23/2017,  6:59 AM

## 2017-08-23 NOTE — Progress Notes (Deleted)
[facility] Individual Statement of Services  Patient Name:  Thomas [name]  Date:  08/23/2017  Welcome to the [facility].  Our goal is to provide you with an individualized program based on your diagnosis and situation, designed to meet your specific needs.  With this comprehensive rehabilitation program, you will be expected to participate in at least 3 hours of rehabilitation therapies Monday-Friday, with modified therapy programming on the weekends.  Your rehabilitation program will include the following services:  Physical Therapy (PT), Occupational Therapy (OT), 24 hour per day rehabilitation nursing, Case Management (Social Worker), Rehabilitation Medicine, Nutrition Services and Pharmacy Services  Weekly team conferences will be held on Wednesdays to discuss your progress.  Your Social Worker will talk with you frequently to get your input and to update you on team discussions.  Team conferences with you and your family in attendance may also be held.  Expected length of stay:  10 to 12 days  Overall anticipated outcome: modified independent with supervision for showering, car transfers, and ambulation  Depending on your progress and recovery, your program may change. Your Social Worker will coordinate services and will keep you informed of any changes. Your Social Worker's name and contact numbers are listed  below.  The following services may also be recommended but are not provided by the [facility] will be made to provide these services after discharge if needed.  Arrangements include referral to agencies that provide these services.  Your insurance has been verified to be:  Contractor Your primary doctor is:  Sonia Baller plans to get you set up at one of the community  clinics.  Pertinent information will be shared with your doctor and your insurance company.  Social Worker:  Alfonse Alpers, LCSW  [phone] or ([phone]  Information discussed with and copy given to patient by: Trey Sailors, 08/23/2017, 10:37 AM

## 2017-08-23 NOTE — Progress Notes (Addendum)
Physical Therapy Note  Patient Details  Name: Thomas [name] MRN: [id number] Date of Birth: 05-17-1955 Today's Date: 08/23/2017  1100-1200, 60 min individual tx Pain: none per pt  Pt stated he was tired, and dizzier than usual today.  He described vertigo when surrounded by multiple moving people, or when he moves his head at times. No nystagmus or vertigo increase with gaze stabilization exercises for horizontal or vertical head movements.  Sit> stand from w/c to RW, short distance gait training into small room to move clothing from washer> dryer.  VCs for safe standing position during this activity of moving clothes.  neuromuscular re-education via forced use, positioning, mulitmodal cues for sustained stretch bil hamstrings and heel cords, standing on wedge x 4 minutes with bil UE support on RW. In supine, 10 x 1 each unilateral R bridging, bil bridging with adductor squeeze.    Balance retraining standing on compliant Airex mat x 5 minutes, during bil UE activity on table, requriing min/mod assist for balance due to continual LOB R as he reached R within BOS.  Gait training with RW on level tile through obstacle course of 6 cones, x 2.  Pt did not bump cones, but as he began to fatigue, he needed mod assist for balance due to RLE instability.  Floor transfer to floor mat, with supervision, without cues.  Discussed fall recovery and use of 911 system.  Gait training to return to laundry room with RW, min assist.   Pt unable to problem- solve how to open door to dryer with RW in the way; became angry, shouted and cursed.  PT returned pt to room in his w/c.  Returned to bed due to frustration and fatigue.  Sherol Dade, RN informed as she entered to give meds. PT asked her to set alarm.  See function navigator for current status.   Paulita Licklider 08/23/2017, 7:57 AM

## 2017-08-23 NOTE — Progress Notes (Signed)
ANTICOAGULATION CONSULT NOTE - Follow-up Consult  Pharmacy Consult for Enoxaparin and Warfarin Indication: Soft plaque (clot) in ascending arch and descending aorta  No Known Allergies  Patient Measurements: Height: 5\' 7"  (170.2 cm) Weight: 118 lb 9.7 oz (53.8 kg) IBW/kg (Calculated) : 66.1 Heparin Dosing Weight: 56.7 kg  Vital Signs: Temp: 98.2 F (36.8 C) (05/30 0411) BP: 135/90 (05/30 0411) Pulse Rate: 72 (05/30 0411)  Labs: Recent Labs    08/21/17 0510 08/22/17 0541 08/23/17 0658  HGB 12.0* 12.4* 12.4*  HCT 37.2* 38.9* 38.6*  PLT 361 350 359  LABPROT 19.2* 20.6* 22.4*  INR 1.63 1.79 1.99  CREATININE 1.38*  --   --    Estimated Creatinine Clearance: 41.7 mL/min (A) (by C-G formula based on SCr of 1.38 mg/dL (H)).  Assessment: 6 yoM with clot in aorta, likely source of embolic stroke. Pharmacy dosing warfarin and Lovenox bridge to therapeutic INR. INR increasing toward  goal at 1.99 today. CBC stable, no bleeding noted.  Goal of Therapy:  INR 2-3 Monitor platelets by anticoagulation protocol: Yes   Plan:  Continue Lovenox 60mg  SQ q12h (stop tomorrow if INR >2) Repeat Warfarin 5 mg PO x1 tonight. Daily INR Monitor s/sx of bleeding  [employer], Pharm.D., BCPS Clinical Pharmacist Pager: [phone] Clinical phone for 08/23/2017 from 8:30-4:00 is [phone]. After 4pm, please call Main Rx [phone]) for assistance. 08/23/2017 8:52 AM

## 2017-08-23 NOTE — Progress Notes (Signed)
Social Work Assessment and Plan  Patient Details  Name: Thomas [name] MRN: [id number] Date of Birth: [birth year]  Today's Date: 08/22/2017  Problem List:  Patient Active Problem List   Diagnosis Date Noted  . Chronic ischemic vertebrobasilar artery cerebellar stroke 08/18/2017  . Cerebellar artery embolism   . Gait disorder   . Cerebral embolism with cerebral infarction 08/16/2017  . Paraparesis ([place])   . Hypertension 08/15/2017  . Gait instability 08/15/2017  . Pelvic pain 08/15/2017  . Chronic kidney disease 08/15/2017  . Abnormal CT scan, pelvis 08/15/2017  . Meniere disease, left 08/08/2017  . Sepsis due to Streptococcus pneumoniae ([place])   . Acute respiratory failure with hypoxia ([place])   . Hemorrhagic shock ([place]) 07/02/2017  . Acute blood loss anemia 07/02/2017  . S/P AAA repair   . LLQ abdominal pain   . Ruptured abdominal aortic aneurysm (AAA) ([place])   . Hypotension   . Bacteremia due to Streptococcus 06/30/2017  . CVA (cerebral vascular accident) ([place]) 06/30/2017  . Chest pain 06/28/2017  . CAP (community acquired pneumonia): Clinical 06/28/2017  . Acute renal failure superimposed on stage 3 chronic kidney disease ([place]) 06/28/2017  . Dehydration 06/28/2017  . Leukocytosis 06/28/2017  . Elevated troponin 06/28/2017  . Dizziness 06/28/2017  . Vertigo 06/28/2017  . Acute pericarditis: Probable 06/28/2017   Past Medical History:  Past Medical History:  Diagnosis Date  . Hypertension   . Stroke [facility])    Past Surgical History:  Past Surgical History:  Procedure Laterality Date  . ENDOVASCULAR STENT INSERTION N/A 07/01/2017   Procedure: ENDOVASCULAR STENT GRAFT INSERTION;  Surgeon: Serafina Mitchell, MD;  Location: [facility] OR;  Service: Vascular;  Laterality: N/A;  . THROMBECTOMY FEMORAL ARTERY Bilateral 07/01/2017   Procedure: Bilateral IlioFemoral Thrombectomies;  Surgeon: Serafina Mitchell, MD;  Location: [facility] OR;  Service: Vascular;  Laterality: Bilateral;   Social History:   reports that he quit smoking about 5 weeks ago. His smoking use included cigarettes. He has never used smokeless tobacco. He reports that he does not drink alcohol or use drugs.  Family / Support Systems Marital Status: Divorced How Long?: 20+ years Patient Roles: Parent, Other (Comment)(cousin; uncle; employee) Children: Thomas [name] - dtr - [phone] Other Supports: cousin and cousin's wife with whom he lives Anticipated Caregiver: cousin, cousin's wife, and dtr available PRN.  Pt also has other family who could assist with transportation, etc. 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 Return to Work Plans: Pt does not feel he will be able to return to work with his current condition.  He wants to see how he recovers before making any decisions. Legal History/Current Legal Issues: none reported Guardian/Conservator: N/A - MD has determined that pt is capable of making his own decisions.   Abuse/Neglect Abuse/Neglect Assessment Can Be Completed: Yes Physical Abuse: Denies Verbal Abuse: Denies Sexual Abuse: Denies Exploitation of patient/patient's resources: Denies Self-Neglect: Denies  Emotional Status Pt's affect, behavior and adjustment status: Pt is frustrated this happened to him, but is taking things as they come and not getting too worked up about things. Recent Psychosocial Issues: Pt tried to apply for Medicaid, but was denied.  CSW encouraged him to try again after he receives all of the bills  from his hospital stays. Psychiatric History: none reported Substance Abuse History: none reported  Patient / Family Perceptions, Expectations & Goals Pt/Family  understanding of illness & functional limitations: Pt reports a good understanding of his current condition and does not have any unanswered questions at this time. Premorbid pt/family roles/activities: Pt was working, living/spending time with family. Anticipated changes in roles/activities/participation: Pt hopes to resume activities as he is able. Pt/family expectations/goals: Pt wants to regain his independence.  Community Resources Express Scripts: None Premorbid Home Care/DME Agencies: None Transportation available at discharge: family Resource referrals recommended: Neuropsychology, Support group (specify)  Discharge Planning Living Arrangements: Other relatives Support Systems: Children, Other relatives, Friends/neighbors Type of Residence: Private residence Insurance Resources: Multimedia programmer (Engineer, civil (consulting)) Museum/gallery curator Resources: Employment, Radio broadcast assistant Screen Referred: Yes Living Expenses: Lives with family(bought house together with his cousin) Money Management: Patient Does the patient have any problems obtaining your medications?: No Home Management: Pt and cousin/cousin's wife share this responsibility.  Patient/Family Preliminary Plans: Pt plans to return to his hoem where his family will help him intermittently. Social Work Anticipated Follow Up Needs: HH/OP, Support Group(CSW will try to get pt home health, but his insurance does not cover it.  CSW will inquire about possibility of charity care for pt.) Expected length of stay: 10 to 12 days  Clinical Impression CSW met with pt to introduce self and role of CSW, as well as to complete assessment.  Pt was talkative with CSW and shared about his life, his family, his employment, his frustrations, etc.  Pt is grateful for the care on CIR and sees some improvement.  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  He stated he always  finds a way to make things work.  Pt was originally denied for Medicaid, but he will have more bills now with this hospital stay and CSW encouraged him to reapply, but pt stated once he does something and it doesn't work out, he moves on, so he would likely not reapply.  CSW will still ask hospital financial counselor to review pt's case to see what he may be eligible for.  Pt has multiple family members he feels he can call upon for different things, but will not have someone with him all of the time, most likely.  He has been told that is our team's recommendation and most importantly for showering, car transfers, and ambulation.  CSW will continue to follow and assist as needed.  Davide Risdon, Silvestre Mesi 08/23/2017, 11:02 AM

## 2017-08-23 NOTE — Progress Notes (Signed)
Occupational Therapy Session Note  Patient Details  Name: Thomas [name] MRN: [id number] Date of Birth: 1955/09/07  Today's Date: 08/23/2017 OT Individual Time: 5732-2025 OT Individual Time Calculation (min): 45 min    Short Term Goals: Week 1:  OT Short Term Goal 1 (Week 1): Pt will perform bathing at shower level with supervision for safety and balance.   OT Short Term Goal 2 (Week 1): Pt will attend to right side of body and environment in functional tasks with 1 verbal cue.   OT Short Term Goal 3 (Week 1): Pt will utilize correct hand placement with no verbal cues.    Skilled Therapeutic Interventions/Progress Updates:    Pt received supine in bed easily awoken with vc, with c/o pain as described below. Session focused on functional mobility and ADL transfers. Pt completed 150 ft of functional mobility with RW, with 2 standing level rest breaks and occasional vc for RW management. Pt required extended seated rest break seated on couch in ADL apt following mobility d/t fatigue. Pt then completed x2 tub transfer, using anterior wall for support and CGA to bring both feet up and over tub wall. Pt then practiced using TTB, requiring only (S). Pt edu re safety with each method and importance of having supervision during transfers. Pt completed functional reaching around kitchen, with vc/edu provided for Rw management when opening fridge/cabinets. Pt completed 150 ft of functional mobility back to room. Pt transferred to toilet with CGA overall, completing all clothing management standing. Pt ambulated out of bathroom and back to bed and was left in supine with bed alarm set and all needs met.   Therapy Documentation Precautions:  Precautions Precautions: Fall Restrictions Weight Bearing Restrictions: No   Vital Signs: Therapy Vitals Temp: 98.6 F (37 C) Pulse Rate: 87 Resp: 18 BP: 131/75 Patient Position (if appropriate): Lying Oxygen Therapy SpO2: 98 % O2 Device: Room Air Pain: Pain  Assessment Pain Scale: 0-10 Pain Score: 2  Pain Type: Acute pain Pain Location: Head Pain Orientation: Mid Pain Descriptors / Indicators: Headache Pain Onset: On-going Pain Intervention(s): Emotional support;Environmental changes  See Function Navigator for Current Functional Status.   Therapy/Group: Individual Therapy  Curtis Sites 08/23/2017, 3:32 PM

## 2017-08-24 ENCOUNTER — Inpatient Hospital Stay (HOSPITAL_COMMUNITY): Payer: Private Health Insurance - Indemnity

## 2017-08-24 ENCOUNTER — Inpatient Hospital Stay (HOSPITAL_COMMUNITY): Payer: Private Health Insurance - Indemnity | Admitting: Physical Therapy

## 2017-08-24 ENCOUNTER — Inpatient Hospital Stay (HOSPITAL_COMMUNITY): Payer: Private Health Insurance - Indemnity | Admitting: Speech Pathology

## 2017-08-24 ENCOUNTER — Inpatient Hospital Stay (HOSPITAL_COMMUNITY): Payer: Private Health Insurance - Indemnity | Admitting: Occupational Therapy

## 2017-08-24 LAB — BASIC METABOLIC PANEL
Anion gap: 10 (ref 5–15)
BUN: 18 mg/dL (ref 6–20)
CHLORIDE: 104 mmol/L (ref 101–111)
CO2: 23 mmol/L (ref 22–32)
Calcium: 9.1 mg/dL (ref 8.9–10.3)
Creatinine, Ser: 1.27 mg/dL — ABNORMAL HIGH (ref 0.61–1.24)
GFR calc Af Amer: >60 mL/min (ref >60)
GFR calc non Af Amer: 58 mL/min — ABNORMAL LOW (ref >60)
GLUCOSE: 109 mg/dL — AB (ref 65–99)
POTASSIUM: 3.8 mmol/L (ref 3.5–5.1)
Sodium: 137 mmol/L (ref 135–145)

## 2017-08-24 LAB — CBC
HEMATOCRIT: 38.7 % — AB (ref 39.0–52.0)
HEMOGLOBIN: 12.6 g/dL — AB (ref 13.0–17.0)
MCH: 25.5 pg — AB (ref 26.0–34.0)
MCHC: 32.6 g/dL (ref 30.0–36.0)
MCV: 78.3 fL (ref 78.0–100.0)
PLATELETS: 380 10*3/uL (ref 150–400)
RBC: 4.94 MIL/uL (ref 4.22–5.81)
RDW: 16 % — ABNORMAL HIGH (ref 11.5–15.5)
WBC: 10.9 10*3/uL — ABNORMAL HIGH (ref 4.0–10.5)

## 2017-08-24 LAB — PROTIME-INR
INR: 2.25
Prothrombin Time: 24.7 seconds — ABNORMAL HIGH (ref 11.4–15.2)

## 2017-08-24 MED ORDER — WARFARIN SODIUM 5 MG PO TABS
5.0000 mg | ORAL_TABLET | Freq: Once | ORAL | Status: AC
  Administered 2017-08-24: 5 mg via ORAL
  Filled 2017-08-24: qty 1

## 2017-08-24 MED ORDER — SODIUM CHLORIDE 0.9 % IV SOLN: 75.0000 mL/h | INTRAVENOUS | Status: DC

## 2017-08-24 MED ORDER — ACETAMINOPHEN 325 MG PO TABS
650.0000 mg | ORAL_TABLET | Freq: Every day | ORAL | Status: DC
  Administered 2017-08-24 – 2017-08-28 (×5): 650 mg via ORAL
  Filled 2017-08-24 (×5): qty 2

## 2017-08-24 MED ORDER — DIPHENHYDRAMINE HCL 25 MG PO CAPS: 25.0000 mg | ORAL_CAPSULE | Freq: Every day | ORAL | Status: DC

## 2017-08-24 MED ORDER — DIPHENHYDRAMINE HCL 12.5 MG/5ML PO ELIX
12.5000 mg | ORAL_SOLUTION | Freq: Every day | ORAL | Status: DC
  Administered 2017-08-24 – 2017-08-28 (×5): 12.5 mg via ORAL
  Filled 2017-08-24 (×5): qty 10

## 2017-08-24 MED ORDER — SENNOSIDES-DOCUSATE SODIUM 8.6-50 MG PO TABS: 1.0000 | ORAL_TABLET | Freq: Every evening | ORAL | Status: DC | PRN

## 2017-08-24 MED ORDER — SODIUM CHLORIDE 0.9 % IV SOLN
75.0000 mL/h | INTRAVENOUS | Status: DC
  Administered 2017-08-24: 75 mL/h via INTRAVENOUS

## 2017-08-24 NOTE — Progress Notes (Signed)
Occupational Therapy Session Note  Patient Details  Name: Thomas [name] MRN: [id number] Date of Birth: 1955/10/22  Today's Date: 08/24/2017 OT Individual Time: 4496-7591 OT Individual Time Calculation (min): 75 min    Short Term Goals: Week 1:  OT Short Term Goal 1 (Week 1): Pt will perform bathing at shower level with supervision for safety and balance.   OT Short Term Goal 2 (Week 1): Pt will attend to right side of body and environment in functional tasks with 1 verbal cue.   OT Short Term Goal 3 (Week 1): Pt will utilize correct hand placement with no verbal cues.    Skilled Therapeutic Interventions/Progress Updates:    Pt seen for OT ADL bathing/dressing session. Pt received sitting on toilet upon arrival, agreeable to tx session and correctly called for assist when finished voiding. He ambulated from toilet to shower with RW, cont to require VCs for RW management in functional context.  He bathed seated on tub transfer bench with distant supervision. Returned to toilet to dress, standing with RW to pull pants up. HE gathered dirty towels from floor, cuing for RW management and guarding assist for balance.  Grooming tasks completed standing at sink with supervision.  He ambulated to therapy gym using RW with close supervision, VCs for less reliance on UEs. Completed LE strengthening exercises listed below. Rest breaks required throughout.  Standing LE ABduction x10 R/L  Supine: Glute raises x2 sets of 10  Sidelying: Straight leg lift R/L x2 sets of 10  Pt returned to room at end of session, requesting return to supine. Left with all needs in reach and bed alarm on.  Therapy Documentation Precautions:  Precautions Precautions: Fall Restrictions Weight Bearing Restrictions: No Pain:  Complaints of stomach cramps throughout session, shower and repositioned for comfort.   See Function Navigator for Current Functional Status.   Therapy/Group: Individual Therapy  Donnarae Rae  L 08/24/2017, 6:34 AM

## 2017-08-24 NOTE — Progress Notes (Signed)
ANTICOAGULATION CONSULT NOTE - Follow-up Consult  Pharmacy Consult for Enoxaparin and Warfarin Indication: Soft plaque (clot) in ascending arch and descending aorta  No Known Allergies  Patient Measurements: Height: 5\' 7"  (170.2 cm) Weight: 118 lb 9.7 oz (53.8 kg) IBW/kg (Calculated) : 66.1 Heparin Dosing Weight: 56.7 kg  Vital Signs: BP: 127/77 (05/31 0613) Pulse Rate: 86 (05/31 0613)  Labs: Recent Labs    08/22/17 0541 08/23/17 0658 08/24/17 0621  HGB 12.4* 12.4* 12.6*  HCT 38.9* 38.6* 38.7*  PLT 350 359 380  LABPROT 20.6* 22.4* 24.7*  INR 1.79 1.99 2.25   Estimated Creatinine Clearance: 41.7 mL/min (A) (by C-G formula based on SCr of 1.38 mg/dL (H)).  Assessment: 46 yoM with clot in aorta, likely source of embolic stroke. Pharmacy dosing warfarin and Lovenox bridge to therapeutic INR. INR in range at 2.25 today- INR needs to be therapeutic for 24h before Lovenox can be discontinued. CBC stable, no bleeding noted.  Goal of Therapy:  INR 2-3 Monitor platelets by anticoagulation protocol: Yes   Plan:  Lovenox 60mg  SQ q12h (will stop on 6/1 if INR remains >2) Warfarin 5 mg PO x1 tonight. Daily INR Monitor s/sx of bleeding  Tell Rozelle D. Theo Reither, PharmD, BCPS Clinical Pharmacist [phone] Please check AMION for all [place] numbers 08/24/2017 9:31 AM

## 2017-08-24 NOTE — Progress Notes (Signed)
Patient very fatigued today with limited activity tolerance. Reports that he feels "bad" --off. BP low and was started on trazodone last pm due to reports of insomnia. Will d/c trazodone and treat with IVF. Repeat Lytes ok. CBC this am showed H/H stable and leucocytosis without much change. Discussed with PT, RN and MD. INR therapeutic today--will order CT head to rule out bleed or stroke extension.

## 2017-08-24 NOTE — Progress Notes (Signed)
Subjective/Complaints: Slept ok No new problems overnite  Review of systems denies chest pain shortness of breath nausea vomiting diarrhea or constipation Objective: Vital Signs: Blood pressure 127/77, pulse 86, temperature 98.6 F (37 C), resp. rate 18, height 5\' 7"  (1.702 m), weight 53.8 kg (118 lb 9.7 oz), SpO2 97 %. No results found. Results for orders placed or performed during the hospital encounter of 08/18/17 (from the past 72 hour(s))  CBC     Status: Abnormal   Collection Time: 08/22/17  5:41 AM  Result Value Ref Range   WBC 10.0 4.0 - 10.5 K/uL   RBC 4.92 4.22 - 5.81 MIL/uL   Hemoglobin 12.4 (L) 13.0 - 17.0 g/dL   HCT 38.9 (L) 39.0 - 52.0 %   MCV 79.1 78.0 - 100.0 fL   MCH 25.2 (L) 26.0 - 34.0 pg   MCHC 31.9 30.0 - 36.0 g/dL   RDW 16.2 (H) 11.5 - 15.5 %   Platelets 350 150 - 400 K/uL    Comment: Performed at [facility] [address] [place], [place] [address]  Protime-INR     Status: Abnormal   Collection Time: 08/22/17  5:41 AM  Result Value Ref Range   Prothrombin Time 20.6 (H) 11.4 - 15.2 seconds   INR 1.79     Comment: Performed at [facility] [address], [place], [place] [address]  Protime-INR     Status: Abnormal   Collection Time: 08/23/17  6:58 AM  Result Value Ref Range   Prothrombin Time 22.4 (H) 11.4 - 15.2 seconds   INR 1.99     Comment: Performed at [facility] [address] [place], [place] [address]  CBC     Status: Abnormal   Collection Time: 08/23/17  6:58 AM  Result Value Ref Range   WBC 10.0 4.0 - 10.5 K/uL   RBC 4.92 4.22 - 5.81 MIL/uL   Hemoglobin 12.4 (L) 13.0 - 17.0 g/dL   HCT 38.6 (L) 39.0 - 52.0 %   MCV 78.5 78.0 - 100.0 fL   MCH 25.2 (L) 26.0 - 34.0 pg   MCHC 32.1 30.0 - 36.0 g/dL   RDW 16.1 (H) 11.5 - 15.5 %   Platelets 359 150 - 400 K/uL    Comment: Performed at [facility], [place] [address], [place], [place] [address]  Protime-INR     Status: Abnormal   Collection Time: 08/24/17  6:21 AM   Result Value Ref Range   Prothrombin Time 24.7 (H) 11.4 - 15.2 seconds   INR 2.25     Comment: Performed at [facility] [address] [place], [place] [address]  CBC     Status: Abnormal   Collection Time: 08/24/17  6:21 AM  Result Value Ref Range   WBC 10.9 (H) 4.0 - 10.5 K/uL   RBC 4.94 4.22 - 5.81 MIL/uL   Hemoglobin 12.6 (L) 13.0 - 17.0 g/dL   HCT 38.7 (L) 39.0 - 52.0 %   MCV 78.3 78.0 - 100.0 fL   MCH 25.5 (L) 26.0 - 34.0 pg   MCHC 32.6 30.0 - 36.0 g/dL   RDW 16.0 (H) 11.5 - 15.5 %   Platelets 380 150 - 400 K/uL    Comment: Performed at [facility], [place] [address] [place], [place] [address]     HEENT: Right facial numbness no droop Cardio: RRR and no murmur Resp: CTA B/L and unlabored GI: BS positive and  NT, ND Extremity:  No Edema Skin:   Intact Neuro: Alert/Oriented, Abnormal Sensory has Less  LT sensation in thoracic paraspinal than lumbar, reduced LT bilateral lower legs but not feet, Abnormal Motor 5/5 in B Delt, bi , tri grip , 4- R HF, 4 L HF 4- R KE, 4 Left 4/5 B ankle DF and Abnormal  FMC Ataxic/ dec FMC Right heel/shin Musc/Skel:  Other no pain with UE or LE ROM Gen NAD   Assessment/Plan: 1. Functional deficits secondary to , left upper ext ataxia, gait disorder CVA  which require 3+ hours per day of interdisciplinary therapy in a comprehensive inpatient rehab setting. Physiatrist is providing close team supervision and 24 hour management of active medical problems listed below. Physiatrist and rehab team continue to assess barriers to discharge/monitor patient progress toward functional and medical goals. FIM: Function - Bathing Position: Shower Body parts bathed by patient: Right arm, Left arm, Chest, Abdomen, Right upper leg, Left upper leg, Right lower leg, Left lower leg, Front perineal area, Buttocks, Back Body parts bathed by helper: Buttocks, Back Assist Level: Supervision or verbal cues  Function- Upper Body  Dressing/Undressing What is the patient wearing?: Pull over shirt/dress Pull over shirt/dress - Perfomed by patient: Thread/unthread right sleeve, Thread/unthread left sleeve, Put head through opening, Pull shirt over trunk Assist Level: Set up Set up : To obtain clothing/put away Function - Lower Body Dressing/Undressing What is the patient wearing?: Underwear, Pants, Non-skid slipper socks, Shoes Position: Sitting EOB Underwear - Performed by patient: Thread/unthread right underwear leg, Thread/unthread left underwear leg, Pull underwear up/down Pants- Performed by patient: Thread/unthread right pants leg, Thread/unthread left pants leg, Pull pants up/down Non-skid slipper socks- Performed by patient: Don/doff right sock, Don/doff left sock Shoes - Performed by patient: Don/doff right shoe, Don/doff left shoe, Fasten right, Fasten left Assist for footwear: Supervision/touching assist Assist for lower body dressing: Supervision or verbal cues  Function - Toileting Toileting steps completed by patient: Performs perineal hygiene, Adjust clothing prior to toileting, Adjust clothing after toileting Toileting steps completed by helper: Adjust clothing prior to toileting, Adjust clothing after toileting Toileting Assistive Devices: Grab bar or rail Assist level: Supervision or verbal cues  Function - Air cabin crew transfer assistive device: Walker, Elevated toilet seat/BSC over toilet Assist level to toilet: Supervision or verbal cues Assist level from toilet: Supervision or verbal cues  Function - Chair/bed transfer Chair/bed transfer method: Ambulatory Chair/bed transfer assist level: Supervision or verbal cues Chair/bed transfer assistive device: Environmental consultant, Armrests Chair/bed transfer details: Verbal cues for safe use of DME/AE  Function - Locomotion: Wheelchair Will patient use wheelchair at discharge?: No Type: Manual Max wheelchair distance: 150 ft Assist Level:  Supervision or verbal cues Assist Level: Supervision or verbal cues Assist Level: Supervision or verbal cues Turns around,maneuvers to table,bed, and toilet,negotiates 3% grade,maneuvers on rugs and over doorsills: No Function - Locomotion: Ambulation Assistive device: Walker-rolling Max distance: 150 ft Assist level: Touching or steadying assistance (Pt > 75%) Assist level: Touching or steadying assistance (Pt > 75%) Assist level: Touching or steadying assistance (Pt > 75%) Assist level: Touching or steadying assistance (Pt > 75%) Walk 10 feet on uneven surfaces activity did not occur: Safety/medical concerns  Function - Comprehension Comprehension: Auditory Comprehension assist level: Follows basic conversation/direction with no assist  Function - Expression Expression: Verbal Expression assist level: Expresses basic needs/ideas: With extra time/assistive device  Function - Social Interaction Social Interaction assist level: Interacts appropriately 75 - 89% of the time -  Needs redirection for appropriate language or to initiate interaction.  Function - Problem Solving Problem solving assist level: Solves basic problems with no assist, Solves basic 50 - 74% of the time/requires cueing 25 - 49% of the time  Function - Memory Memory assist level: Requires cues to use assistive device Patient normally able to recall (first 3 days only): That he or she is in a hospital, Location of own room, Current season, Staff names and faces  Medical Problem List and Plan: 1. Paraparesis lower extremity sensory changes and gait disorderhas had a small cerebellar infarcts left cervicomedullary jct infarct which may result in LLE weakness given that Corticospinal tracts have crossed Cont CIR PT, OT,SLP, per OT sup wit ADL 2. DVT Prophylaxis/Anticoagulation: Pharmaceutical:Coumadin and Lovenoxwith low dose ASA- Daily CBC per pharm protocol- INR 2.25 overlapping enoxaparin x 1 day Labs reviewed  Hgb stable and normal  3. Pain Management:tylenol prn. Will add low dose gabapentin to help with dysesthesias. 4. Mood:LCSW to follow for evaluation and support. 5. Neuropsych: This patientiscapable of making decisions on hisown behalf. 6. Skin/Wound Care:Monitor wound for healing. Maintain adequate nutritional and hydration Status.  7. Fluids/Electrolytes/Nutrition:Monitor I/O-- lytes normal except mild elevation of creat, good meal intake I ~1622ml, O ~1600, will d/c IVF and monitor 8. HTN: Monitor BP bid, controlled off meds  ,diastolic mildly elevated Vitals:   08/23/17 2052 08/24/17 0613  BP: 140/83 127/77  Pulse: 74 86  Resp: 19 18  Temp:    SpO2: 99% 97%   9. Ruptured aneurysm s/p repair with subsequent Endarterectomy: Monitor BP bid. Controlled at this time 10. CKD: Monitor for stability. Avoid nephrotoxic medication. Baseline SCr 1.3- at baseline 11. Dyslipidemia: on Lipitor 80mg /d- No muscle pain     LOS (Days) 6 A FACE TO FACE EVALUATION WAS PERFORMED  Thomas [name] 08/24/2017, 8:44 AM

## 2017-08-24 NOTE — Progress Notes (Signed)
Speech Language Pathology Daily Session Note  Patient Details  Name: Edmund Holcomb MRN: [id number] Date of Birth: [birth year]  Today's Date: 08/24/2017 SLP Individual Time: 9242-6834 SLP Individual Time Calculation (min): 45 min  Short Term Goals: Week 1: SLP Short Term Goal 1 (Week 1): Pt will anticipate barriers to discharge and generate appropriate soultions at Mod I.  SLP Short Term Goal 2 (Week 1): Pt will complete semi-complex tasks at Mod I for functional problem solving.  SLP Short Term Goal 3 (Week 1): Pt will self-monitor and correct functional errors in semi-complex tasks at Mod I.   Skilled Therapeutic Interventions: Skilled treatment session focused on cognition goals. SLP facilitated session by providing Min A faded to supervision cues to complete semi-complex mental math and to answer questions based on auditory information. Pt wasn't feeling well but demonstrated decreased insight d/t resistance to increasing water consumption during session. Pt was left upright in bed, bed alarm on and all needs within reach. Continue per current plan of care.      Function:    Cognition Comprehension Comprehension assist level: Follows basic conversation/direction with no assist  Expression   Expression assist level: Expresses basic needs/ideas: With no assist  Social Interaction Social Interaction assist level: Interacts appropriately 75 - 89% of the time - Needs redirection for appropriate language or to initiate interaction.;Interacts appropriately 90% of the time - Needs monitoring or encouragement for participation or interaction.  Problem Solving Problem solving assist level: Solves basic problems with no assist  Memory Memory assist level: Requires cues to use assistive device    Pain    Therapy/Group: Individual Therapy  Hubbert Landrigan 08/24/2017, 4:16 PM

## 2017-08-24 NOTE — Progress Notes (Addendum)
Physical Therapy Session Note  Patient Details  Name: Thomas [name] MRN: [id number] Date of Birth: 04/07/1955  Today's Date: 08/24/2017 PT Individual Time: 6269-4854 PT Individual Time Calculation (min): 85 min   Short Term Goals: Week 1:  PT Short Term Goal 1 (Week 1): Pt will ascend/descend 4 steps with AD and no handrails, min assist PT Short Term Goal 2 (Week 1): Pt will maintain dynamic standing balance without UE support, min assist PT Short Term Goal 3 (Week 1): Pt will perform bed<>w/c transfers with supervision   Skilled Therapeutic Interventions/Progress Updates:  Pt received in bed, reporting significant fatigue and appearing as though he doesn't feel well but agreeable to tx. NT assessed BP at beginning of session (93/73 mmHg). Therapist placed shoe portion of foot up brace in shoe and pt donned shoes & connected footup brace without assistance/cuing. Pt transfers bed<>w/c with min assist via stand pivot and washes face at sink with supervision. Pt propels w/c throughout unit with BUE/BLE with supervision for strengthening & cardiopulmonary endurance training. BP reassessed in gym and RN made aware of pt's c/o fatigue and unwell appearance - RN cleared pt for participation in therapy.  Pt wiling to ambulate and pt ambulates with RW & min assist with R foot-up brace donned and cuing to increase RLE step width. PA passed in hallway & notified of pt's c/o - she recommends taking it easy during session. Pt returned to w/c & BP assessed upon sitting down then after resting, see below. Pt utilized cybex kinetron in sitting with task focusing on BLE strengthening; resistance up to 30 cm/sec x 1 minute bouts x 4. Pt returned to bed in room & performed the following BLE strengthening exercises with therapist providing multimodal cuing for proper technique: heel slides, straight leg raises, and hip abduction slides with activity focusing on strengthening, targeting R hip flexors as much as possible 2/2  ongoing weakness. At end of session pt left in bed in handoff to SLP. Encouraged pt to follow up with cousin regarding rail installation by steps at home entrance. Also encouraged pt to consume water as much as possible.  Therapy Documentation Precautions:  Precautions Precautions: Fall Restrictions Weight Bearing Restrictions: No   Vital Signs: Sitting in w/c after propelling to gym:  BP = 103/73 mmHg (LUE), HR = 96 bpm  BP = 108/71 mmHg (LUE), HR = 102 bpm   Sitting in w/c after ambulating:  BP = 96/78 mmHg, HR = 81 bpm (LUE) After resting ~5 minutes:  BP = 103/78 mmHg, HR = 94 bpm (LUE)    See Function Navigator for Current Functional Status.   Therapy/Group: Individual Therapy  [name] 08/24/2017, 3:48 PM

## 2017-08-25 ENCOUNTER — Inpatient Hospital Stay (HOSPITAL_COMMUNITY): Payer: Private Health Insurance - Indemnity

## 2017-08-25 DIAGNOSIS — D72829 Elevated white blood cell count, unspecified: Secondary | ICD-10-CM

## 2017-08-25 DIAGNOSIS — I10 Essential (primary) hypertension: Secondary | ICD-10-CM

## 2017-08-25 DIAGNOSIS — Z7901 Long term (current) use of anticoagulants: Secondary | ICD-10-CM

## 2017-08-25 DIAGNOSIS — D62 Acute posthemorrhagic anemia: Secondary | ICD-10-CM

## 2017-08-25 DIAGNOSIS — N183 Chronic kidney disease, stage 3 (moderate): Secondary | ICD-10-CM

## 2017-08-25 LAB — CBC
HEMATOCRIT: 36.2 % — AB (ref 39.0–52.0)
Hemoglobin: 11.6 g/dL — ABNORMAL LOW (ref 13.0–17.0)
MCH: 25.2 pg — ABNORMAL LOW (ref 26.0–34.0)
MCHC: 32 g/dL (ref 30.0–36.0)
MCV: 78.5 fL (ref 78.0–100.0)
Platelets: 345 10*3/uL (ref 150–400)
RBC: 4.61 MIL/uL (ref 4.22–5.81)
RDW: 16 % — AB (ref 11.5–15.5)
WBC: 11.1 10*3/uL — ABNORMAL HIGH (ref 4.0–10.5)

## 2017-08-25 LAB — PROTIME-INR
INR: 2.78
Prothrombin Time: 29.1 seconds — ABNORMAL HIGH (ref 11.4–15.2)

## 2017-08-25 MED ORDER — WARFARIN SODIUM 2.5 MG PO TABS
2.5000 mg | ORAL_TABLET | Freq: Once | ORAL | Status: AC
  Administered 2017-08-25: 2.5 mg via ORAL
  Filled 2017-08-25: qty 1

## 2017-08-25 NOTE — Progress Notes (Signed)
Occupational Therapy Session Note  Patient Details  Name: Thomas [name] MRN: [id number] Date of Birth: 1955-11-18  Today's Date: 08/25/2017 OT Individual Time: 3007-6226 OT Individual Time Calculation (min): 55 min    Short Term Goals: Week 1:  OT Short Term Goal 1 (Week 1): Pt will perform bathing at shower level with supervision for safety and balance.   OT Short Term Goal 2 (Week 1): Pt will attend to right side of body and environment in functional tasks with 1 verbal cue.   OT Short Term Goal 3 (Week 1): Pt will utilize correct hand placement with no verbal cues.    Skilled Therapeutic Interventions/Progress Updates:    1:1. Pt reporting head feeling, "funny" and diziness throughout session. RN aware. Pt vitals WFL. Pt reporting need to toilet. Pt completes all transfers at ambulatory level with S-CGA for balance  To various surfaces such as BSC, TTB, couch, recliner and EOB with VC for hand placement and 1 LOB laterally to R with MOD A to recover when approching couch. Pt toilets seated with Supervision and stands at sink for grooming. Pt ambulates to all tx spaces with RW and S-CGA. Pt changes bed sheets on standard bed with CGA for ambulation without AD, bumping foot into bed post 1x causing minor LOB pt able to catch self with min A. Pt requires increased time to rest after task seated in recliner describing past dizziness history. Exited session with pt seated in bed, call light inr each and exit alarm on Therapy Documentation Precautions:  Precautions Precautions: Fall Restrictions Weight Bearing Restrictions: No General:   Vital Signs: Therapy Vitals Pulse Rate: 77 BP: 130/80(per OT) Patient Position (if appropriate): Lying Oxygen Therapy SpO2: 97 % O2 Device: Room Air  See Function Navigator for Current Functional Status.   Therapy/Group: Individual Therapy  Thomas [name] 08/25/2017, 10:44 AM

## 2017-08-25 NOTE — Progress Notes (Signed)
ANTICOAGULATION CONSULT NOTE - Follow-up Consult  Pharmacy Consult for Enoxaparin and Warfarin Indication: Soft plaque (clot) in ascending arch and descending aorta  No Known Allergies  Patient Measurements: Height: 5\' 7"  (170.2 cm) Weight: 118 lb 9.7 oz (53.8 kg) IBW/kg (Calculated) : 66.1 Heparin Dosing Weight: 56.7 kg  Vital Signs: Temp: 97.8 F (36.6 C) (06/01 0541) Temp Source: Oral (06/01 0541) BP: 130/80 (06/01 1002) Pulse Rate: 77 (06/01 1002)  Labs: Recent Labs    08/23/17 0658 08/24/17 0621 08/24/17 1545 08/25/17 0828  HGB 12.4* 12.6*  --  11.6*  HCT 38.6* 38.7*  --  36.2*  PLT 359 380  --  345  LABPROT 22.4* 24.7*  --  29.1*  INR 1.99 2.25  --  2.78  CREATININE  --   --  1.27*  --    Estimated Creatinine Clearance: 45.3 mL/min (A) (by C-G formula based on SCr of 1.27 mg/dL (H)).  Assessment: 35 yoM with clot in aorta, likely source of embolic stroke. Pharmacy dosing warfarin and Lovenox bridge to therapeutic INR. Today INR is > 2 for 2nd consecutive day, ok to discontinue Lovenox bridge. HGB dropped 12.6>11.6, PLT are wnl. CT ordered yesterday to r/o bleed or stroke extension because patient was feeling "bad" - revealed no acute abnormalities. No bleeding noted.  INR was initially slow to trend up to >2, but has been increasing more rapidly over last 3 days on 5 mg/day. Patient may require alternating regimen with 2.5 mg 1-2 days/week.  Goal of Therapy:  INR 2-3 Monitor platelets by anticoagulation protocol: Yes   Plan:  Discontinue enoxaparin bridge Warfarin 2.5 mg PO x1 tonight (likely return to 5 mg tomorrow) Daily INR Monitor s/sx of bleeding   Charlene Brooke, PharmD PGY1 Pharmacy Resident Phone: [phone] After 4:30pm: please see AMION for floor-specific [facility] pharmacy phone 08/25/2017 10:43 AM

## 2017-08-25 NOTE — Progress Notes (Signed)
Occupational Therapy Session Note  Patient Details  Name: Thomas [name] MRN: [id number] Date of Birth: 07-10-1955  Today's Date: 08/25/2017 OT Individual Time: 5809-9833 OT Individual Time Calculation (min): 41 min    Short Term Goals: Week 1:  OT Short Term Goal 1 (Week 1): Pt will perform bathing at shower level with supervision for safety and balance.   OT Short Term Goal 2 (Week 1): Pt will attend to right side of body and environment in functional tasks with 1 verbal cue.   OT Short Term Goal 3 (Week 1): Pt will utilize correct hand placement with no verbal cues.    Skilled Therapeutic Interventions/Progress Updates:    1:1. Pt ambulates to bathroom with min A and 1 LOB R with MOD A to recover with RW and steady A to manage clothing. Pt more unsteady this afternoon with no pain. Pt stands with hips stabilized on sink to brush teeth. Pt completes BUE coordination/strengthening with 1# wrist weights of 3x30 passes of basketball (chest, bounce and overhead). Pt completes 1x15 reps shoulder press, int/ext rotation, flex/ext, chest press, and elbow flex ext for BUE strengthening required for BADLs.  Exited session with pt seated in bed with exit alarm on and all needs in reach  Therapy Documentation Precautions:  Precautions Precautions: Fall Restrictions Weight Bearing Restrictions: No See Function Navigator for Current Functional Status.   Therapy/Group: Individual Therapy  LAMAR METER 08/25/2017, 3:02 PM

## 2017-08-25 NOTE — Progress Notes (Signed)
Subjective/Complaints: Patient seen lying in bed this morning.  No reported issues overnight.  Review of systems: Limited due to language  Objective: Vital Signs: Blood pressure 130/80, pulse 77, temperature 97.8 F (36.6 C), temperature source Oral, resp. rate 17, height '5\' 7"'  (1.702 m), weight 53.8 kg (118 lb 9.7 oz), SpO2 97 %. Ct Head Wo Contrast  Result Date: 08/24/2017 CLINICAL DATA:  Altered mental status and facial numbness EXAM: CT HEAD WITHOUT CONTRAST TECHNIQUE: Contiguous axial images were obtained from the base of the skull through the vertex without intravenous contrast. COMPARISON:  08/15/2017 FINDINGS: Brain: Diffuse atrophic changes are noted as are bilateral lacunar infarcts and chronic white matter ischemic change. The overall appearance is stable from the prior exam. No findings to suggest acute hemorrhage, acute infarction or space-occupying mass lesion are noted. Vascular: No hyperdense vessel or unexpected calcification. Skull: Normal. Negative for fracture or focal lesion. Sinuses/Orbits: No acute finding. Other: None. IMPRESSION: Chronic atrophic and ischemic changes without acute abnormality. Electronically Signed   By: Inez Catalina M.D.   On: 08/24/2017 18:44   Results for orders placed or performed during the hospital encounter of 08/18/17 (from the past 72 hour(s))  Protime-INR     Status: Abnormal   Collection Time: 08/23/17  6:58 AM  Result Value Ref Range   Prothrombin Time 22.4 (H) 11.4 - 15.2 seconds   INR 1.99     Comment: Performed at [facility] [address], [place], [place] [address]  CBC     Status: Abnormal   Collection Time: 08/23/17  6:58 AM  Result Value Ref Range   WBC 10.0 4.0 - 10.5 K/uL   RBC 4.92 4.22 - 5.81 MIL/uL   Hemoglobin 12.4 (L) 13.0 - 17.0 g/dL   HCT 38.6 (L) 39.0 - 52.0 %   MCV 78.5 78.0 - 100.0 fL   MCH 25.2 (L) 26.0 - 34.0 pg   MCHC 32.1 30.0 - 36.0 g/dL   RDW 16.1 (H) 11.5 - 15.5 %   Platelets 359 150 - 400 K/uL     Comment: Performed at [facility], [place] [address], [place], [place] [address]  Protime-INR     Status: Abnormal   Collection Time: 08/24/17  6:21 AM  Result Value Ref Range   Prothrombin Time 24.7 (H) 11.4 - 15.2 seconds   INR 2.25     Comment: Performed at [facility] [address], [place], [place] [address]  CBC     Status: Abnormal   Collection Time: 08/24/17  6:21 AM  Result Value Ref Range   WBC 10.9 (H) 4.0 - 10.5 K/uL   RBC 4.94 4.22 - 5.81 MIL/uL   Hemoglobin 12.6 (L) 13.0 - 17.0 g/dL   HCT 38.7 (L) 39.0 - 52.0 %   MCV 78.3 78.0 - 100.0 fL   MCH 25.5 (L) 26.0 - 34.0 pg   MCHC 32.6 30.0 - 36.0 g/dL   RDW 16.0 (H) 11.5 - 15.5 %   Platelets 380 150 - 400 K/uL    Comment: Performed at [facility], [place] [address] [place], [place] [address]  Basic metabolic panel     Status: Abnormal   Collection Time: 08/24/17  3:45 PM  Result Value Ref Range   Sodium 137 135 - 145 mmol/L   Potassium 3.8 3.5 - 5.1 mmol/L   Chloride 104 101 - 111 mmol/L   CO2 23 22 - 32 mmol/L   Glucose, Bld 109 (H)  65 - 99 mg/dL   BUN 18 6 - 20 mg/dL   Creatinine, Ser 1.27 (H) 0.61 - 1.24 mg/dL   Calcium 9.1 8.9 - 10.3 mg/dL   GFR calc non Af Amer 58 (L) >60 mL/min   GFR calc Af Amer >60 >60 mL/min    Comment: (NOTE) The eGFR has been calculated using the CKD EPI equation. This calculation has not been validated in all clinical situations. eGFR's persistently <60 mL/min signify possible Chronic Kidney Disease.    Anion gap 10 5 - 15    Comment: Performed at [facility] [address], [place], [place] [address]  Protime-INR     Status: Abnormal   Collection Time: 08/25/17  8:28 AM  Result Value Ref Range   Prothrombin Time 29.1 (H) 11.4 - 15.2 seconds   INR 2.78     Comment: Performed at [facility] [address] [place], [place] [address]  CBC     Status: Abnormal   Collection Time: 08/25/17  8:28 AM  Result Value Ref Range   WBC 11.1 (H) 4.0 - 10.5  K/uL   RBC 4.61 4.22 - 5.81 MIL/uL   Hemoglobin 11.6 (L) 13.0 - 17.0 g/dL   HCT 36.2 (L) 39.0 - 52.0 %   MCV 78.5 78.0 - 100.0 fL   MCH 25.2 (L) 26.0 - 34.0 pg   MCHC 32.0 30.0 - 36.0 g/dL   RDW 16.0 (H) 11.5 - 15.5 %   Platelets 345 150 - 400 K/uL    Comment: Performed at [facility], [place] [address] [place], [place] [address]     Constitutional: No distress . Vital signs reviewed. HENT: Normocephalic.  Atraumatic. Eyes: EOMI. No discharge. Cardiovascular: RRR. No JVD. Respiratory: CTA Bilaterally. Normal effort. GI: BS +. Non-distended. Musc: No edema or tenderness in extremities. Neuro: Alert Motor: Grossly 4+/5 throughout  Gen NAD.  Vital signs reviewed.   Assessment/Plan: 1. Functional deficits secondary to , left upper ext ataxia, gait disorder CVA  which require 3+ hours per day of interdisciplinary therapy in a comprehensive inpatient rehab setting. Physiatrist is providing close team supervision and 24 hour management of active medical problems listed below. Physiatrist and rehab team continue to assess barriers to discharge/monitor patient progress toward functional and medical goals. FIM: Function - Bathing Position: Shower Body parts bathed by patient: Right arm, Left arm, Chest, Abdomen, Right upper leg, Left upper leg, Right lower leg, Left lower leg, Front perineal area, Buttocks, Back Body parts bathed by helper: Buttocks, Back Assist Level: Supervision or verbal cues  Function- Upper Body Dressing/Undressing What is the patient wearing?: Pull over shirt/dress Pull over shirt/dress - Perfomed by patient: Thread/unthread right sleeve, Thread/unthread left sleeve, Put head through opening, Pull shirt over trunk Assist Level: Set up Set up : To obtain clothing/put away Function - Lower Body Dressing/Undressing What is the patient wearing?: Underwear, Pants, Non-skid slipper socks, Shoes Position: Sitting EOB Underwear - Performed by patient:  Thread/unthread right underwear leg, Thread/unthread left underwear leg, Pull underwear up/down Pants- Performed by patient: Thread/unthread right pants leg, Thread/unthread left pants leg, Pull pants up/down Non-skid slipper socks- Performed by patient: Don/doff right sock, Don/doff left sock Shoes - Performed by patient: Don/doff right shoe, Don/doff left shoe, Fasten right, Fasten left Assist for footwear: Supervision/touching assist Assist for lower body dressing: Supervision or verbal cues  Function - Toileting Toileting steps completed by patient: Performs perineal hygiene, Adjust clothing prior to toileting, Adjust clothing after  toileting Toileting steps completed by helper: Adjust clothing prior to toileting, Adjust clothing after toileting Toileting Assistive Devices: Grab bar or rail Assist level: Supervision or verbal cues  Function - Toilet Transfers Toilet transfer assistive device: Walker, Elevated toilet seat/BSC over toilet Assist level to toilet: Supervision or verbal cues Assist level from toilet: Supervision or verbal cues  Function - Chair/bed transfer Chair/bed transfer method: Stand pivot Chair/bed transfer assist level: Touching or steadying assistance (Pt > 75%) Chair/bed transfer assistive device: Walker, Armrests Chair/bed transfer details: Verbal cues for safe use of DME/AE  Function - Locomotion: Wheelchair Will patient use wheelchair at discharge?: No Type: Manual Max wheelchair distance: 150 ft  Assist Level: Supervision or verbal cues Assist Level: Supervision or verbal cues Assist Level: Supervision or verbal cues Turns around,maneuvers to table,bed, and toilet,negotiates 3% grade,maneuvers on rugs and over doorsills: No Function - Locomotion: Ambulation Assistive device: Walker-rolling(R foot up brace) Max distance: 175 ft Assist level: Touching or steadying assistance (Pt > 75%) Assist level: Touching or steadying assistance (Pt > 75%) Assist  level: Touching or steadying assistance (Pt > 75%) Assist level: Touching or steadying assistance (Pt > 75%) Walk 10 feet on uneven surfaces activity did not occur: Safety/medical concerns  Function - Comprehension Comprehension: Auditory Comprehension assist level: Follows basic conversation/direction with no assist  Function - Expression Expression: Verbal Expression assist level: Expresses basic needs/ideas: With no assist  Function - Social Interaction Social Interaction assist level: Interacts appropriately 75 - 89% of the time - Needs redirection for appropriate language or to initiate interaction.  Function - Problem Solving Problem solving assist level: Solves basic problems with no assist  Function - Memory Memory assist level: Requires cues to use assistive device Patient normally able to recall (first 3 days only): That he or she is in a hospital, Location of own room, Current season, Staff names and faces  Medical Problem List and Plan: 1. Paraparesis lower extremity sensory changes and gait disorderhas had a small cerebellar infarcts left cervicomedullary jct infarct which may result in LLE weakness given that Corticospinal tracts have crossed  Cont CIR   Notes reviewed, images reviewed, labs reviewed 2. DVT Prophylaxis/Anticoagulation: Pharmaceutical:Coumadin with low dose ASA- Daily CBC per pharm protocol- INR therapeutic on 6/1 3. Pain Management:tylenol prn. Added low dose gabapentin to help with dysesthesias. 4. Mood:LCSW to follow for evaluation and support. 5. Neuropsych: This patientis?  Fully capable of making decisions on hisown behalf. 6. Skin/Wound Care:Monitor wound for healing. Maintain adequate nutritional and hydration Status.  7. Fluids/Electrolytes/Nutrition:Monitor I/Os  Dced IVF and monitor 8. HTN: Monitor BP bid Vitals:   08/25/17 0541 08/25/17 1002  BP: 120/77 130/80  Pulse: 79 77  Resp: 17   Temp: 97.8 F (36.6 C)   SpO2: 97%  97%   Controlled on 6/1 9. Ruptured aneurysm s/p repair with subsequent Endarterectomy: Monitor BP bid. Controlled at this time 10. CKD: Monitor for stability. Avoid nephrotoxic medication. Baseline  SCr 1.27 on 5/31  11. Dyslipidemia: on Lipitor 56m/d- No muscle pain 12. Leukocytosis  WBCs 11.1 on 6/1   Afebrile   Labs ordered for Monday 13.  Acute blood loss anemia  Hemoglobin 11.6 on 6/1  Continue to monitor  LOS (Days) 7 A FACE TO FACE EVALUATION WAS PERFORMED  Renie Stelmach ALorie Phenix6/03/2017, 6:16 PM

## 2017-08-26 ENCOUNTER — Inpatient Hospital Stay (HOSPITAL_COMMUNITY): Payer: Private Health Insurance - Indemnity

## 2017-08-26 DIAGNOSIS — I712 Thoracic aortic aneurysm, without rupture, unspecified: Secondary | ICD-10-CM

## 2017-08-26 DIAGNOSIS — R2681 Unsteadiness on feet: Secondary | ICD-10-CM

## 2017-08-26 LAB — PROTIME-INR
INR: 2.26
Prothrombin Time: 24.8 seconds — ABNORMAL HIGH (ref 11.4–15.2)

## 2017-08-26 LAB — CBC
HEMATOCRIT: 38.5 % — AB (ref 39.0–52.0)
HEMOGLOBIN: 12 g/dL — AB (ref 13.0–17.0)
MCH: 25.1 pg — AB (ref 26.0–34.0)
MCHC: 31.2 g/dL (ref 30.0–36.0)
MCV: 80.5 fL (ref 78.0–100.0)
PLATELETS: 356 10*3/uL (ref 150–400)
RBC: 4.78 MIL/uL (ref 4.22–5.81)
RDW: 16 % — ABNORMAL HIGH (ref 11.5–15.5)
WBC: 11.4 10*3/uL — ABNORMAL HIGH (ref 4.0–10.5)

## 2017-08-26 MED ORDER — WARFARIN SODIUM 5 MG PO TABS
5.0000 mg | ORAL_TABLET | Freq: Once | ORAL | Status: AC
  Administered 2017-08-26: 5 mg via ORAL
  Filled 2017-08-26: qty 1

## 2017-08-26 NOTE — Progress Notes (Signed)
ANTICOAGULATION CONSULT NOTE - Follow-up Consult  Pharmacy Consult for Enoxaparin and Warfarin Indication: Soft plaque (clot) in ascending arch and descending aorta  No Known Allergies  Patient Measurements: Height: 5\' 7"  (170.2 cm) Weight: 118 lb 9.7 oz (53.8 kg) IBW/kg (Calculated) : 66.1 Heparin Dosing Weight: 56.7 kg  Vital Signs: Temp: 98.3 F (36.8 C) (06/02 0528) Temp Source: Oral (06/02 0528) BP: 125/81 (06/02 0528) Pulse Rate: 86 (06/02 0528)  Labs: Recent Labs    08/24/17 0621 08/24/17 1545 08/25/17 0828 08/26/17 1053  HGB 12.6*  --  11.6* 12.0*  HCT 38.7*  --  36.2* 38.5*  PLT 380  --  345 356  LABPROT 24.7*  --  29.1* 24.8*  INR 2.25  --  2.78 2.26  CREATININE  --  1.27*  --   --    Estimated Creatinine Clearance: 45.3 mL/min (A) (by C-G formula based on SCr of 1.27 mg/dL (H)).  Assessment: 57 yoM with clot in aorta, likely source of embolic stroke. Pharmacy dosing warfarin s/p lovenox bridge. CBC is stable, no bleeding noted.  INR therapeutic today at 2.26. Patient may require alternating regimen with 2.5 mg 1-2 days/week.  Goal of Therapy:  INR 2-3 Monitor platelets by anticoagulation protocol: Yes   Plan:  Warfarin 5 mg PO x1 tonight Daily INR Monitor s/sx of bleeding   Charlene Brooke, PharmD PGY1 Pharmacy Resident Phone: [phone] After 4:30pm: please see AMION for floor-specific [facility] pharmacy phone 08/26/2017 1:06 PM

## 2017-08-26 NOTE — Progress Notes (Signed)
Occupational Therapy Session Note  Patient Details  Name: Thomas [name] MRN: [id number] Date of Birth: [birth year]  Today's Date: 08/26/2017 OT Individual Time: 5465-0354 OT Individual Time Calculation (min): 54 min    Short Term Goals: Week 1:  OT Short Term Goal 1 (Week 1): Pt will perform bathing at shower level with supervision for safety and balance.   OT Short Term Goal 2 (Week 1): Pt will attend to right side of body and environment in functional tasks with 1 verbal cue.   OT Short Term Goal 3 (Week 1): Pt will utilize correct hand placement with no verbal cues.    Skilled Therapeutic Interventions/Progress Updates:    1;1. Pt continue to complain of fatigue but no pain. Pt declines bathing today. Focus of session on standing tolerance as well as RUE NMR during [facility] tasks. Pt completes palm<>finger translation and stacking and sorting coins with VC for decreasing compensatory technique of using gravity to bring coins to fingers. Pt drops 2 coins during task. Pt stands to play unfamiliar card game "war" with rules written down on paper as well as max fading to min cueing for rule following. Pt demo decreased noticing/responding to be able to keep track of and slap when double cards present themselves in game. Eixted session with pt seated in bed, call light in reach and exit alarm on.   Therapy Documentation Precautions:  Precautions Precautions: Fall Restrictions Weight Bearing Restrictions: No General:  See Function Navigator for Current Functional Status.   Therapy/Group: Individual Therapy  WAYMAN HOARD 08/26/2017, 10:24 AM

## 2017-08-26 NOTE — Progress Notes (Signed)
Subjective/Complaints: Patient seen lying in bed this morning. He states he slept well overnight. He denies complaints.  Review of systems: Denies CP, SOB, nausea, vomiting, diarrhea.  Objective: Vital Signs: Blood pressure 125/81, pulse 86, temperature 98.3 F (36.8 C), temperature source Oral, resp. rate 19, height 5' 7" (1.702 m), weight 53.8 kg (118 lb 9.7 oz), SpO2 98 %. Ct Head Wo Contrast  Result Date: 08/24/2017 CLINICAL DATA:  Altered mental status and facial numbness EXAM: CT HEAD WITHOUT CONTRAST TECHNIQUE: Contiguous axial images were obtained from the base of the skull through the vertex without intravenous contrast. COMPARISON:  08/15/2017 FINDINGS: Brain: Diffuse atrophic changes are noted as are bilateral lacunar infarcts and chronic white matter ischemic change. The overall appearance is stable from the prior exam. No findings to suggest acute hemorrhage, acute infarction or space-occupying mass lesion are noted. Vascular: No hyperdense vessel or unexpected calcification. Skull: Normal. Negative for fracture or focal lesion. Sinuses/Orbits: No acute finding. Other: None. IMPRESSION: Chronic atrophic and ischemic changes without acute abnormality. Electronically Signed   By: Inez Catalina M.D.   On: 08/24/2017 18:44   Results for orders placed or performed during the hospital encounter of 08/18/17 (from the past 72 hour(s))  Protime-INR     Status: Abnormal   Collection Time: 08/24/17  6:21 AM  Result Value Ref Range   Prothrombin Time 24.7 (H) 11.4 - 15.2 seconds   INR 2.25     Comment: Performed at [facility] [address], [place], [place] [address]  CBC     Status: Abnormal   Collection Time: 08/24/17  6:21 AM  Result Value Ref Range   WBC 10.9 (H) 4.0 - 10.5 K/uL   RBC 4.94 4.22 - 5.81 MIL/uL   Hemoglobin 12.6 (L) 13.0 - 17.0 g/dL   HCT 38.7 (L) 39.0 - 52.0 %   MCV 78.3 78.0 - 100.0 fL   MCH 25.5 (L) 26.0 - 34.0 pg   MCHC 32.6 30.0 - 36.0 g/dL   RDW 16.0  (H) 11.5 - 15.5 %   Platelets 380 150 - 400 K/uL    Comment: Performed at [facility], [place] [address] [place], [place] [address]  Basic metabolic panel     Status: Abnormal   Collection Time: 08/24/17  3:45 PM  Result Value Ref Range   Sodium 137 135 - 145 mmol/L   Potassium 3.8 3.5 - 5.1 mmol/L   Chloride 104 101 - 111 mmol/L   CO2 23 22 - 32 mmol/L   Glucose, Bld 109 (H) 65 - 99 mg/dL   BUN 18 6 - 20 mg/dL   Creatinine, Ser 1.27 (H) 0.61 - 1.24 mg/dL   Calcium 9.1 8.9 - 10.3 mg/dL   GFR calc non Af Amer 58 (L) >60 mL/min   GFR calc Af Amer >60 >60 mL/min    Comment: (NOTE) The eGFR has been calculated using the CKD EPI equation. This calculation has not been validated in all clinical situations. eGFR's persistently <60 mL/min signify possible Chronic Kidney Disease.    Anion gap 10 5 - 15    Comment: Performed at [facility] [address], [place], [place] [address]  Protime-INR     Status: Abnormal   Collection Time: 08/25/17  8:28 AM  Result Value Ref Range   Prothrombin Time 29.1 (H) 11.4 - 15.2 seconds   INR 2.78     Comment: Performed at [facility] [place],  [place] [address]  CBC     Status: Abnormal   Collection Time: 08/25/17  8:28 AM  Result Value Ref Range   WBC 11.1 (H) 4.0 - 10.5 K/uL   RBC 4.61 4.22 - 5.81 MIL/uL   Hemoglobin 11.6 (L) 13.0 - 17.0 g/dL   HCT 36.2 (L) 39.0 - 52.0 %   MCV 78.5 78.0 - 100.0 fL   MCH 25.2 (L) 26.0 - 34.0 pg   MCHC 32.0 30.0 - 36.0 g/dL   RDW 16.0 (H) 11.5 - 15.5 %   Platelets 345 150 - 400 K/uL    Comment: Performed at [facility], [place] [address] [place], [place] [address]     Constitutional: No distress . Vital signs reviewed. HENT: Normocephalic.  Atraumatic. Eyes: EOMI. No discharge. Cardiovascular: RRR. No JVD. Respiratory: CTA Bilaterally. Normal effort. GI: BS +. Non-distended. Musc: No edema or tenderness in extremities. Neuro: Alert Motor: Grossly 4+/5  throughout No ataxia noted in bilateral upper extremities. Gen NAD.  Vital signs reviewed.   Assessment/Plan: 1. Functional deficits secondary to , left upper ext ataxia, gait disorder CVA  which require 3+ hours per day of interdisciplinary therapy in a comprehensive inpatient rehab setting. Physiatrist is providing close team supervision and 24 hour management of active medical problems listed below. Physiatrist and rehab team continue to assess barriers to discharge/monitor patient progress toward functional and medical goals. FIM: Function - Bathing Position: Shower Body parts bathed by patient: Right arm, Left arm, Chest, Abdomen, Right upper leg, Left upper leg, Right lower leg, Left lower leg, Front perineal area, Buttocks, Back Body parts bathed by helper: Buttocks, Back Assist Level: Supervision or verbal cues  Function- Upper Body Dressing/Undressing What is the patient wearing?: Pull over shirt/dress Pull over shirt/dress - Perfomed by patient: Thread/unthread right sleeve, Thread/unthread left sleeve, Put head through opening, Pull shirt over trunk Assist Level: Set up Set up : To obtain clothing/put away Function - Lower Body Dressing/Undressing What is the patient wearing?: Underwear, Pants, Non-skid slipper socks, Shoes Position: Sitting EOB Underwear - Performed by patient: Thread/unthread right underwear leg, Thread/unthread left underwear leg, Pull underwear up/down Pants- Performed by patient: Thread/unthread right pants leg, Thread/unthread left pants leg, Pull pants up/down Non-skid slipper socks- Performed by patient: Don/doff right sock, Don/doff left sock Shoes - Performed by patient: Don/doff right shoe, Don/doff left shoe, Fasten right, Fasten left Assist for footwear: Supervision/touching assist Assist for lower body dressing: Supervision or verbal cues  Function - Toileting Toileting steps completed by patient: Performs perineal hygiene, Adjust clothing  prior to toileting, Adjust clothing after toileting Toileting steps completed by helper: Adjust clothing prior to toileting, Adjust clothing after toileting Toileting Assistive Devices: Grab bar or rail Assist level: Supervision or verbal cues  Function - Air cabin crew transfer assistive device: Walker, Elevated toilet seat/BSC over toilet Assist level to toilet: Supervision or verbal cues Assist level from toilet: Supervision or verbal cues  Function - Chair/bed transfer Chair/bed transfer method: Stand pivot Chair/bed transfer assist level: Touching or steadying assistance (Pt > 75%) Chair/bed transfer assistive device: Walker, Armrests Chair/bed transfer details: Verbal cues for safe use of DME/AE  Function - Locomotion: Wheelchair Will patient use wheelchair at discharge?: No Type: Manual Max wheelchair distance: 150 ft  Assist Level: Supervision or verbal cues Assist Level: Supervision or verbal cues Assist Level: Supervision or verbal cues Turns around,maneuvers to table,bed, and toilet,negotiates 3% grade,maneuvers on rugs and over doorsills: No Function - Locomotion: Ambulation Assistive device: Walker-rolling(R  foot up brace) Max distance: 175 ft Assist level: Touching or steadying assistance (Pt > 75%) Assist level: Touching or steadying assistance (Pt > 75%) Assist level: Touching or steadying assistance (Pt > 75%) Assist level: Touching or steadying assistance (Pt > 75%) Walk 10 feet on uneven surfaces activity did not occur: Safety/medical concerns  Function - Comprehension Comprehension: Auditory Comprehension assist level: Follows basic conversation/direction with no assist  Function - Expression Expression: Verbal Expression assist level: Expresses basic needs/ideas: With no assist  Function - Social Interaction Social Interaction assist level: Interacts appropriately 75 - 89% of the time - Needs redirection for appropriate language or to initiate  interaction.  Function - Problem Solving Problem solving assist level: Solves basic problems with no assist  Function - Memory Memory assist level: Requires cues to use assistive device Patient normally able to recall (first 3 days only): That he or she is in a hospital, Location of own room, Current season, Staff names and faces  Medical Problem List and Plan: 1. Paraparesis lower extremity sensory changes and gait disorderhas had a small cerebellar infarcts left cervicomedullary jct infarct which may result in LLE weakness given that Corticospinal tracts have crossed  Cont CIR  2. DVT Prophylaxis/Anticoagulation: Pharmaceutical:Coumadin with low dose ASA-   INR therapeutic on 6/1, pending today 3. Pain Management:tylenol prn. Added low dose gabapentin to help with dysesthesias. 4. Mood:LCSW to follow for evaluation and support. 5. Neuropsych: This patientis?  Fully capable of making decisions on hisown behalf. 6. Skin/Wound Care:Monitor wound for healing. Maintain adequate nutritional and hydration Status.  7. Fluids/Electrolytes/Nutrition:Monitor I/Os  Dced IVF and monitor 8. HTN: Monitor BP bid Vitals:   08/25/17 2029 08/26/17 0528  BP: 137/88 125/81  Pulse: 75 86  Resp: 16 19  Temp: 98.1 F (36.7 C) 98.3 F (36.8 C)  SpO2: 100% 98%   Controlled on 6/2 9. Ruptured aneurysm s/p repair with subsequent Endarterectomy: Monitor BP bid. Controlled at this time 10. CKD: Monitor for stability. Avoid nephrotoxic medication. Baseline  SCr 1.27 on 5/31  11. Dyslipidemia: on Lipitor 82m/d- No muscle pain 12. Leukocytosis  WBCs 11.1 on 6/1   Afebrile   Labs ordered for tomorrow 13.  Acute blood loss anemia  Hemoglobin 11.6 on 6/1  Labs ordered for tomorrow  Continue to monitor  LOS (Days) 8 A FACE TO FACE EVALUATION WAS PERFORMED  Ankit ALorie Phenix6/04/2017, 7:42 AM

## 2017-08-27 ENCOUNTER — Encounter: Payer: Private Health Insurance - Indemnity | Admitting: Surgery

## 2017-08-27 ENCOUNTER — Inpatient Hospital Stay (HOSPITAL_COMMUNITY): Payer: Private Health Insurance - Indemnity

## 2017-08-27 ENCOUNTER — Inpatient Hospital Stay (HOSPITAL_COMMUNITY): Payer: Private Health Insurance - Indemnity | Admitting: Occupational Therapy

## 2017-08-27 ENCOUNTER — Inpatient Hospital Stay (HOSPITAL_COMMUNITY): Payer: Private Health Insurance - Indemnity | Admitting: Physical Therapy

## 2017-08-27 LAB — CBC
HCT: 39.7 % (ref 39.0–52.0)
Hemoglobin: 12.8 g/dL — ABNORMAL LOW (ref 13.0–17.0)
MCH: 25.5 pg — AB (ref 26.0–34.0)
MCHC: 32.2 g/dL (ref 30.0–36.0)
MCV: 79.2 fL (ref 78.0–100.0)
PLATELETS: 373 10*3/uL (ref 150–400)
RBC: 5.01 MIL/uL (ref 4.22–5.81)
RDW: 16 % — ABNORMAL HIGH (ref 11.5–15.5)
WBC: 11.2 10*3/uL — ABNORMAL HIGH (ref 4.0–10.5)

## 2017-08-27 LAB — PROTIME-INR
INR: 2
PROTHROMBIN TIME: 22.5 s — AB (ref 11.4–15.2)

## 2017-08-27 MED ORDER — WARFARIN SODIUM 5 MG PO TABS
5.0000 mg | ORAL_TABLET | Freq: Once | ORAL | Status: AC
  Administered 2017-08-27: 5 mg via ORAL
  Filled 2017-08-27: qty 1

## 2017-08-27 MED ORDER — COUMADIN BOOK
Freq: Once | Status: AC
Start: 2017-08-27 — End: 2017-08-27
  Administered 2017-08-27: 18:00:00
  Filled 2017-08-27: qty 1

## 2017-08-27 NOTE — Progress Notes (Signed)
Occupational Therapy Session Note  Patient Details  Name: Thomas [name] MRN: [id number] Date of Birth: [birth year]  Today's Date: 08/27/2017 OT Individual Time: 1331-1400 OT Individual Time Calculation (min): 29 min    Short Term Goals: Week 1:  OT Short Term Goal 1 (Week 1): Pt will perform bathing at shower level with supervision for safety and balance.   OT Short Term Goal 2 (Week 1): Pt will attend to right side of body and environment in functional tasks with 1 verbal cue.   OT Short Term Goal 3 (Week 1): Pt will utilize correct hand placement with no verbal cues.       Skilled Therapeutic Interventions/Progress Updates:    Pt received supine in bed c/o fatigue from previous therapy sessions and requesting to not leave bed. Session focused on creation of B UE HEP using theraband, created in conjunction with pt goals and requests. Pt actively participated in HEP creation and completed teach back of all exercises. Pt was provided with blue and orange therabands and given demo re grading of activities to increase B UE strength and endurance. Pt performed 1 set of x15 scapular retraction, shoulder flexion, elbow flex, elbow ext, and shoulder abduction, completed in order to increase B UE strength/endurance required for increased I with ADL/IADLs. Vc provided throughout for technique and instruction. Pt left supine in bed with NT present.   Therapy Documentation Precautions:  Precautions Precautions: Fall Restrictions Weight Bearing Restrictions: No Vital Signs: Therapy Vitals Temp: 97.7 F (36.5 C) Temp Source: Oral Pulse Rate: 79 Resp: 18 BP: 135/75 Patient Position (if appropriate): Lying Oxygen Therapy SpO2: 94 % O2 Device: Room Air Pain: Pain Assessment Pain Scale: 0-10 Pain Score: 0-No pain Pain Location: Abdomen Pain Orientation: Lower Pain Descriptors / Indicators: Aching Patients Stated Pain Goal: 2 Pain Intervention(s): Medication (See eMAR)  See Function Navigator  for Current Functional Status.   Therapy/Group: Individual Therapy  Curtis Sites 08/27/2017, 2:01 PM

## 2017-08-27 NOTE — Progress Notes (Signed)
ANTICOAGULATION CONSULT NOTE - Follow Up Consult  Pharmacy Consult for Coumadin Indication: Aortic embolus>CVA  No Known Allergies  Patient Measurements: Height: 5\' 7"  (170.2 cm) Weight: 118 lb 9.7 oz (53.8 kg) IBW/kg (Calculated) : 66.1  Vital Signs: Temp: 97.9 F (36.6 C) (06/03 0427) BP: 119/80 (06/03 0427) Pulse Rate: 77 (06/03 0427)  Labs: Recent Labs    08/24/17 1545  08/25/17 0828 08/26/17 1053 08/27/17 0703  HGB  --    < > 11.6* 12.0* 12.8*  HCT  --   --  36.2* 38.5* 39.7  PLT  --   --  345 356 373  LABPROT  --   --  29.1* 24.8* 22.5*  INR  --   --  2.78 2.26 2.00  CREATININE 1.27*  --   --   --   --    < > = values in this interval not displayed.    Estimated Creatinine Clearance: 45.3 mL/min (A) (by C-G formula based on SCr of 1.27 mg/dL (H)).  Assessment:  Anticoag: lovenox to coumadin bridge complete- soft clot in aorta thought to be source of embolism. Hgb improved 12.8. Plts stable. INR 1.99>2.25>2.78>2.26>2 down today  Goal of Therapy:  INR 2-3 Monitor platelets by anticoagulation protocol: Yes   Plan:  Warfarin 5 mg x 1 again today Daily INR  Thomas [name] S. Alford Highland, PharmD, [place] Clinical Staff Pharmacist Pager [phone]  Thomas [name] 08/27/2017,10:35 AM

## 2017-08-27 NOTE — Progress Notes (Signed)
Occupational Therapy Session Note  Patient Details  Name: Thomas [name] MRN: [id number] Date of Birth: [birth year]  Today's Date: 08/27/2017 OT Individual Time: 0973-5329 OT Individual Time Calculation (min): 60 min    Short Term Goals: Week 1:  OT Short Term Goal 1 (Week 1): Pt will perform bathing at shower level with supervision for safety and balance.   OT Short Term Goal 2 (Week 1): Pt will attend to right side of body and environment in functional tasks with 1 verbal cue.   OT Short Term Goal 3 (Week 1): Pt will utilize correct hand placement with no verbal cues.        Skilled Therapeutic Interventions/Progress Updates:    Pt seen this session for ADL training to work on balance skills to prepare pt for discharge in a few days.  Pt was able to use RW to ambulate from bed to toilet to tub bench back to toilet to sink and to bed all with S. No LOB of balance.  No R inattention noted this session. Pt was able to doff and donn clothing with distant S and bathe on tub bench with distant S.  No cues needed.   Pt completed grooming in standing.   He then sat to EOB to work on UE exercises with 4#  Dowel bar he worked on 2 sets of 15 sh presses, chest presses, bicep curls and tricep extensions.  Pt rested in bed post therapy.  Bed alarm set.  Therapy Documentation Precautions:  Precautions Precautions: Fall Restrictions Weight Bearing Restrictions: No   Pain: Pain Assessment Pain Score: 0-No pain ADL:    See Function Navigator for Current Functional Status.   Therapy/Group: Individual Therapy  [place] 08/27/2017, 9:18 AM

## 2017-08-27 NOTE — Progress Notes (Signed)
Orthopedic Tech Progress Note Patient Details:  Thomas [name] [birth year] [id number]  Patient ID: Thomas [name], male   DOB: [birth year], 62 y.o.   MRN: [id number]   Thomas [name] 08/27/2017, 12:07 PM Called in [employer]anger brace order; spoke with Sharyn Lull

## 2017-08-27 NOTE — Progress Notes (Signed)
Physical Therapy Discharge Summary  Patient Details  Name: Thomas [name] MRN: [id number] Date of Birth: 08/22/1955  Today's Date: 08/29/2017   Patient has met 7 of 8 long term goals due to improved activity tolerance, improved balance, improved postural control, increased strength and ability to compensate for deficits.  Patient to discharge at w/c level mod I, transfers mod I. Recommend pt only ambulate with RW, R foot up brace, & assistance when caregivers available.  Patient's daughter was present for caregiver education & voiced understanding of all education - no further questions or concerns voiced.  Reasons goals not met: did not meet car goal as pt requires CGA when ambulating to/from car with RW 2/2 impaired balance & strength  Recommendation:  Patient will benefit from ongoing skilled PT services in home health setting to continue to advance safe functional mobility, address ongoing impairments in impaired neuromuscular control of RLE, decreased strength (specifically BLE, R>L, hip flexors), impaired balance & endurance, progress gait with LRAD, decreased safety awareness & impaired cognition, impaired balance, and minimize fall risk.  Equipment: RW, R foot-up brace, 16x16 w/c  Reasons for discharge: treatment goals met  Patient/family agrees with progress made and goals achieved: Yes   PT Discharge Precautions/Restrictions Precautions Precautions: Fall Restrictions Weight Bearing Restrictions: No Recommend use of R foot-up brace when ambulating  Cognition Overall Cognitive Status: Impaired/Different from baseline Arousal/Alertness: Awake/alert Orientation Level: Oriented X4 Memory: Impaired Memory Impairment: Decreased recall of new information  Sensation Coordination Gross Motor Movements are Fluid and Coordinated: Yes(2/2 weakness in BLE (R>L))   Motor  Motor Motor - Skilled Clinical Observations: generalized weakness & deconditioning, weak hip flexors (R>L), mild R  hemi (RLE) Motor - Discharge Observations: generalized weakness & deconditioning, weak hip flexors (R>L), mild R hemi (RLE)   Mobility Bed Mobility Bed Mobility: Rolling Right;Rolling Left;Supine to Sit;Sit to Supine(regular bed in apartment) Rolling Right: Independent with assistive device Rolling Left: Independent with assistive device Supine to Sit: Independent with assistive device Sit to Supine: Independent with assistive device Transfers Transfers: Squat Pivot Transfers Sit to Stand: Supervision/Verbal cueing(cuing to push up on armrests) Squat Pivot Transfers: Independent with assistive device   Locomotion  Gait Ambulation: Yes Gait Assistance: Contact Guard/Touching assist Ambulation Distance (Feet): 150 Feet Assistive device: Rolling walker(R foot up brace) Gait Gait: Yes Gait Pattern: (impaired R hip flexion, impaired heel strike, decreased step width RLE) Stairs / Additional Locomotion Stairs: Yes Stairs Assistance: Contact Guard/Touching assist Stair Management Technique: Two rails Number of Stairs: 12 Height of Stairs: 6(inches) Ramp: Contact Guard/touching assist(ambulatory with RW) Curb: Contact Guard/Touching assist(with RW) Product manager Mobility: Yes Wheelchair Propulsion: Both upper extremities Wheelchair Parts Management: independent Distance: 150 ft   Trunk/Postural Assessment  Postural Control Postural Control: Deficits on evaluation Protective Responses: (2/2 weakness, impaired balance)   Balance Balance Balance Assessed: Yes Standardized Balance Assessment Standardized Balance Assessment: Berg Balance Test Berg Balance Test Sit to Stand: Able to stand  independently using hands Standing Unsupported: Able to stand 2 minutes with supervision Sitting with Back Unsupported but Feet Supported on Floor or Stool: Able to sit safely and securely 2 minutes Stand to Sit: Sits safely with minimal use of hands Transfers: Able to  transfer safely, definite need of hands Standing Unsupported with Eyes Closed: Able to stand 3 seconds Standing Ubsupported with Feet Together: Needs help to attain position and unable to hold for 15 seconds From Standing, Reach Forward with Outstretched Arm: Reaches forward but needs supervision From Standing Position, Pick up  Object from Floor: Unable to try/needs assist to keep balance From Standing Position, Turn to Look Behind Over each Shoulder: Needs assist to keep from losing balance and falling Turn 360 Degrees: Needs assistance while turning Standing Unsupported, Alternately Place Feet on Step/Stool: Needs assistance to keep from falling or unable to try Standing Unsupported, One Foot in Front: Loses balance while stepping or standing Standing on One Leg: Unable to try or needs assist to prevent fall Total Score: 20   Extremity Assessment  RUE Assessment RUE Assessment: Within Functional Limits LUE Assessment LUE Assessment: Within Functional Limits LLE: generalized weakness but WFL, 3+/5 RLE: significant weak hip flexors RLE but 3+/5 overall as pt able to weight bear   See Function Navigator for Current Functional Status.  [name] 08/29/2017, 12:20 PM

## 2017-08-27 NOTE — Progress Notes (Addendum)
Physical Therapy Session Note  Patient Details  Name: Thomas [name] MRN: [id number] Date of Birth: [birth year]  Today's Date: 08/27/2017 PT Individual Time: 0940-1103 PT Individual Time Calculation (min): 83 min   Short Term Goals: Week 1:  PT Short Term Goal 1 (Week 1): Pt will ascend/descend 4 steps with AD and no handrails, min assist PT Short Term Goal 2 (Week 1): Pt will maintain dynamic standing balance without UE support, min assist PT Short Term Goal 3 (Week 1): Pt will perform bed<>w/c transfers with supervision   Skilled Therapeutic Interventions/Progress Updates:  Pt received in bed & agreeable to tx. Educated pt on how to don foot-up brace, including shoe portion, with pt completing task with supervision. Denies c/o pain but reports fatigue. Pt reports his cousin has installed B rails at home entrance. Educated pt on current supervision level goals with potential need to downgrade them to min assist but pt reports he does not have supervision at home - his cousin's wife is home during the day but he is culturally unable to ask for 24 hr assistance from her; LCSW made aware of the situation. Pt ambulates room>gym with RW & R foot up brace with very close supervision with max cuing for increased step width RLE. Pt with little awareness of decreased balance & R lateral lean. Gait, dynamic balance, and NMR training through gait without AD & mod assist for balance with therapist providing manual facilitation and max education for increased weight shifting L and midline orientation to correct R lateral lean (100 ft x 2). Discussed safety upon d/c and therapist & pt agree pt can use w/c in home and use RW & foot up brace when family can provide supervision, as well as to only walk indoors as pt unsafe to ambulate over uneven surfaces at this time - pt agreeable to to this & LCSW made aware of DME needs (16x16 w/c, RW). Provided pt with OTAGO Level A exercises and provided instruction & cuing for proper  technique while pt performed long arc quads, hamstring curls, hip abduction, mini squats, tandem stance, and sit<>stands with UE support and steady assist with pt taking rest breaks PRN. Pt propels w/c back to room with BUE & distant supervision to increase independence with task. At end of session pt left sitting in w/c with chair alarm & quick release belt donned, needs within reach.  Asked pt to talk with daughter to see if she could come in for family ed tomorrow.   Therapy Documentation Precautions:  Precautions Precautions: Fall Restrictions Weight Bearing Restrictions: No    See Function Navigator for Current Functional Status.   Therapy/Group: Individual Therapy  [name] 08/27/2017, 11:04 AM

## 2017-08-27 NOTE — Progress Notes (Signed)
Subjective/Complaints: Asking whether IV can be d/ced  Review of systems: Denies CP, SOB, nausea, vomiting, diarrhea.  Objective: Vital Signs: Blood pressure 119/80, pulse 77, temperature 97.9 F (36.6 C), resp. rate 18, height _0  (1.702 m), weight 53.8 kg (118 lb 9.7 oz), SpO2 99 %. No results found. Results for orders placed or performed during the hospital encounter of 08/18/17 (from the past 72 hour(s))  Basic metabolic panel     Status: Abnormal   Collection Time: 08/24/17  3:45 PM  Result Value Ref Range   Sodium 137 135 - 145 mmol/L   Potassium 3.8 3.5 - 5.1 mmol/L   Chloride 104 101 - 111 mmol/L   CO2 23 22 - 32 mmol/L   Glucose, Bld 109 (H) 65 - 99 mg/dL   BUN 18 6 - 20 mg/dL   Creatinine, Ser 1.27 (H) 0.61 - 1.24 mg/dL   Calcium 9.1 8.9 - 10.3 mg/dL   GFR calc non Af Amer 58 (L) >60 mL/min   GFR calc Af Amer >60 >60 mL/min    Comment: (NOTE) The eGFR has been calculated using the CKD EPI equation. This calculation has not been validated in all clinical situations. eGFR's persistently <60 mL/min signify possible Chronic Kidney Disease.    Anion gap 10 5 - 15    Comment: Performed at [facility] [address] [place], [place] [address]  Protime-INR     Status: Abnormal   Collection Time: 08/25/17  8:28 AM  Result Value Ref Range   Prothrombin Time 29.1 (H) 11.4 - 15.2 seconds   INR 2.78     Comment: Performed at [facility] [address], [place], [place] [address]  CBC     Status: Abnormal   Collection Time: 08/25/17  8:28 AM  Result Value Ref Range   WBC 11.1 (H) 4.0 - 10.5 K/uL   RBC 4.61 4.22 - 5.81 MIL/uL   Hemoglobin 11.6 (L) 13.0 - 17.0 g/dL   HCT 36.2 (L) 39.0 - 52.0 %   MCV 78.5 78.0 - 100.0 fL   MCH 25.2 (L) 26.0 - 34.0 pg   MCHC 32.0 30.0 - 36.0 g/dL   RDW 16.0 (H) 11.5 - 15.5 %   Platelets 345 150 - 400 K/uL    Comment: Performed at [facility], [place] [address] [place], [place] [address]  Protime-INR     Status:  Abnormal   Collection Time: 08/26/17 10:53 AM  Result Value Ref Range   Prothrombin Time 24.8 (H) 11.4 - 15.2 seconds   INR 2.26     Comment: Performed at [facility] [address], [place], [place] [address]  CBC     Status: Abnormal   Collection Time: 08/26/17 10:53 AM  Result Value Ref Range   WBC 11.4 (H) 4.0 - 10.5 K/uL   RBC 4.78 4.22 - 5.81 MIL/uL   Hemoglobin 12.0 (L) 13.0 - 17.0 g/dL   HCT 38.5 (L) 39.0 - 52.0 %   MCV 80.5 78.0 - 100.0 fL   MCH 25.1 (L) 26.0 - 34.0 pg   MCHC 31.2 30.0 - 36.0 g/dL   RDW 16.0 (H) 11.5 - 15.5 %   Platelets 356 150 - 400 K/uL    Comment: Performed at [facility], [place] [address], [place], [place] [address]  CBC     Status: Abnormal   Collection Time: 08/27/17  7:03 AM  Result Value Ref Range   WBC 11.2 (  H) 4.0 - 10.5 K/uL   RBC 5.01 4.22 - 5.81 MIL/uL   Hemoglobin 12.8 (L) 13.0 - 17.0 g/dL   HCT 39.7 39.0 - 52.0 %   MCV 79.2 78.0 - 100.0 fL   MCH 25.5 (L) 26.0 - 34.0 pg   MCHC 32.2 30.0 - 36.0 g/dL   RDW 16.0 (H) 11.5 - 15.5 %   Platelets 373 150 - 400 K/uL    Comment: Performed at [facility] [address] [place], [place] [address]     Constitutional: No distress . Vital signs reviewed. HENT: Normocephalic.  Atraumatic. Eyes: EOMI. No discharge. Cardiovascular: RRR. No JVD. Respiratory: CTA Bilaterally. Normal effort. GI: BS +. Non-distended. Musc: No edema or tenderness in extremities. Neuro: Alert Motor: Grossly 4+/5 throughout No ataxia noted in bilateral upper extremities. Gen NAD.  Vital signs reviewed.   Assessment/Plan: 1. Functional deficits secondary to , left upper ext ataxia, gait disorder CVA  which require 3+ hours per day of interdisciplinary therapy in a comprehensive inpatient rehab setting. Physiatrist is providing close team supervision and 24 hour management of active medical problems listed below. Physiatrist and rehab team continue to assess barriers to discharge/monitor patient  progress toward functional and medical goals. FIM: Function - Bathing Position: Shower Body parts bathed by patient: Right arm, Left arm, Chest, Abdomen, Right upper leg, Left upper leg, Right lower leg, Left lower leg, Front perineal area, Buttocks, Back Body parts bathed by helper: Buttocks, Back Assist Level: Supervision or verbal cues  Function- Upper Body Dressing/Undressing What is the patient wearing?: Pull over shirt/dress Pull over shirt/dress - Perfomed by patient: Thread/unthread right sleeve, Thread/unthread left sleeve, Put head through opening, Pull shirt over trunk Assist Level: Set up Set up : To obtain clothing/put away Function - Lower Body Dressing/Undressing What is the patient wearing?: Underwear, Pants, Non-skid slipper socks, Shoes Position: Sitting EOB Underwear - Performed by patient: Thread/unthread right underwear leg, Thread/unthread left underwear leg, Pull underwear up/down Pants- Performed by patient: Thread/unthread right pants leg, Thread/unthread left pants leg, Pull pants up/down Non-skid slipper socks- Performed by patient: Don/doff right sock, Don/doff left sock Shoes - Performed by patient: Don/doff right shoe, Don/doff left shoe, Fasten right, Fasten left Assist for footwear: Supervision/touching assist Assist for lower body dressing: Supervision or verbal cues  Function - Toileting Toileting steps completed by patient: Performs perineal hygiene, Adjust clothing prior to toileting, Adjust clothing after toileting Toileting steps completed by helper: Adjust clothing prior to toileting, Adjust clothing after toileting Toileting Assistive Devices: Grab bar or rail Assist level: Supervision or verbal cues  Function - Air cabin crew transfer assistive device: Walker, Elevated toilet seat/BSC over toilet Assist level to toilet: Supervision or verbal cues Assist level from toilet: Supervision or verbal cues  Function - Chair/bed  transfer Chair/bed transfer method: Stand pivot Chair/bed transfer assist level: Touching or steadying assistance (Pt > 75%) Chair/bed transfer assistive device: Walker, Armrests Chair/bed transfer details: Verbal cues for safe use of DME/AE  Function - Locomotion: Wheelchair Will patient use wheelchair at discharge?: No Type: Manual Max wheelchair distance: 150 ft  Assist Level: Supervision or verbal cues Assist Level: Supervision or verbal cues Assist Level: Supervision or verbal cues Turns around,maneuvers to table,bed, and toilet,negotiates 3% grade,maneuvers on rugs and over doorsills: No Function - Locomotion: Ambulation Assistive device: Walker-rolling(R foot up brace) Max distance: 175 ft Assist level: Touching or steadying assistance (Pt > 75%) Assist level: Touching or steadying assistance (Pt > 75%) Assist level: Touching  or steadying assistance (Pt > 75%) Assist level: Touching or steadying assistance (Pt > 75%) Walk 10 feet on uneven surfaces activity did not occur: Safety/medical concerns  Function - Comprehension Comprehension: Auditory Comprehension assist level: Understands complex 90% of the time/cues 10% of the time  Function - Expression Expression: Verbal Expression assist level: Expresses complex 90% of the time/cues < 10% of the time  Function - Social Interaction Social Interaction assist level: Interacts appropriately 90% of the time - Needs monitoring or encouragement for participation or interaction.  Function - Problem Solving Problem solving assist level: Solves complex 90% of the time/cues < 10% of the time  Function - Memory Memory assist level: Recognizes or recalls 90% of the time/requires cueing < 10% of the time Patient normally able to recall (first 3 days only): That he or she is in a hospital, Location of own room, Current season, Staff names and faces  Medical Problem List and Plan: 1. Paraparesis lower extremity sensory changes and  gait disorderhas had a small cerebellar infarcts left cervicomedullary jct infarct which may result in LLE weakness given that Corticospinal tracts have crossed  Cont CIR PT, OT, 2. DVT Prophylaxis/Anticoagulation: Pharmaceutical:Coumadin with low dose ASA-   INR therapeutic on 6/3, no longer need daily CBC given pt is off lovenox 3. Pain Management:tylenol prn. Added low dose gabapentin to help with dysesthesias. 4. Mood:LCSW to follow for evaluation and support. 5. Neuropsych: This patientis?  Fully capable of making decisions on hisown behalf. 6. Skin/Wound Care:Monitor wound for healing. Maintain adequate nutritional and hydration Status.  7. Fluids/Electrolytes/Nutrition:Monitor I/Os  674m recorded yesterday with meals, D/C IV 8. HTN: Monitor BP bid Vitals:   08/26/17 2022 08/27/17 0427  BP: 125/84 119/80  Pulse: 80 77  Resp: 18 18  Temp: 97.8 F (36.6 C) 97.9 F (36.6 C)  SpO2: 98% 99%   Controlled on 6/3 9. Ruptured aneurysm s/p repair with subsequent Endarterectomy: Monitor BP bid. Controlled at this time 10. CKD: Monitor for stability. Avoid nephrotoxic medication. Baseline  SCr 1.27 on 5/31  11. Dyslipidemia: on Lipitor 85md- No muscle pain 12. Leukocytosis  WBCs 11.1 on 6/1, stable , minimal elevation 11.2   Afebrile   13. anemia  Hemoglobin 11.6 on 6/1, improved to 12.8 on 6/3    LOS (Days) 9 A FACE TO FACE EVALUATION WAS PERFORMED  AnCharlett Blake/05/2017, 8:36 AM

## 2017-08-27 NOTE — Progress Notes (Signed)
Patch noted to right achilles.  Patient reports placed it to manage pain. Slept good.

## 2017-08-27 NOTE — Progress Notes (Signed)
Speech Language Pathology Daily Session Note  Patient Details  Name: Zakhai Meisinger MRN: [id number] Date of Birth: 10/25/1955  Today's Date: 08/27/2017 SLP Individual Time: 1105-1150 SLP Individual Time Calculation (min): 45 min  Short Term Goals: Week 1: SLP Short Term Goal 1 (Week 1): Pt will anticipate barriers to discharge and generate appropriate soultions at Mod I.  SLP Short Term Goal 2 (Week 1): Pt will complete semi-complex tasks at Mod I for functional problem solving.  SLP Short Term Goal 3 (Week 1): Pt will self-monitor and correct functional errors in semi-complex tasks at [employer] focused on cognitive skills. SLP facilitated problem solving utilizing familiar card task, pt required min A fading to supervision A verbal cues played at basic and more complex level. Pt was left in room with call bell within reach. Recommend to continue skilled ST services.     Function:  Eating Eating   Modified Consistency Diet: No Eating Assist Level: No help, No cues           Cognition Comprehension Comprehension assist level: Understands complex 90% of the time/cues 10% of the time  Expression   Expression assist level: Expresses complex 90% of the time/cues < 10% of the time  Social Interaction Social Interaction assist level: Interacts appropriately with others - No medications needed.  Problem Solving Problem solving assist level: Solves complex 90% of the time/cues < 10% of the time  Memory Memory assist level: Recognizes or recalls 90% of the time/requires cueing < 10% of the time    Pain Pain Assessment Pain Scale: 0-10 Pain Score: 0-No pain Pain Location: Abdomen Pain Orientation: Lower Pain Descriptors / Indicators: Aching Patients Stated Pain Goal: 2 Pain Intervention(s): Medication (See eMAR)  Therapy/Group: Individual Therapy  Kostas Marrow  [facility] 08/27/2017, 2:32 PM

## 2017-08-28 ENCOUNTER — Inpatient Hospital Stay (HOSPITAL_COMMUNITY): Payer: Private Health Insurance - Indemnity

## 2017-08-28 ENCOUNTER — Inpatient Hospital Stay (HOSPITAL_COMMUNITY): Payer: Private Health Insurance - Indemnity | Admitting: Occupational Therapy

## 2017-08-28 ENCOUNTER — Inpatient Hospital Stay (HOSPITAL_COMMUNITY): Payer: Private Health Insurance - Indemnity | Admitting: Physical Therapy

## 2017-08-28 LAB — PROTIME-INR
INR: 2.33
Prothrombin Time: 25.3 seconds — ABNORMAL HIGH (ref 11.4–15.2)

## 2017-08-28 LAB — BASIC METABOLIC PANEL
ANION GAP: 8 (ref 5–15)
BUN: 22 mg/dL — AB (ref 6–20)
CO2: 26 mmol/L (ref 22–32)
Calcium: 8.8 mg/dL — ABNORMAL LOW (ref 8.9–10.3)
Chloride: 105 mmol/L (ref 101–111)
Creatinine, Ser: 1.2 mg/dL (ref 0.61–1.24)
GFR calc non Af Amer: >60 mL/min (ref >60)
Glucose, Bld: 93 mg/dL (ref 65–99)
POTASSIUM: 3.8 mmol/L (ref 3.5–5.1)
SODIUM: 139 mmol/L (ref 135–145)

## 2017-08-28 MED ORDER — WARFARIN SODIUM 5 MG PO TABS
5.0000 mg | ORAL_TABLET | Freq: Once | ORAL | Status: AC
  Administered 2017-08-28: 5 mg via ORAL
  Filled 2017-08-28: qty 1

## 2017-08-28 NOTE — Progress Notes (Signed)
Physical Therapy Session Note  Patient Details  Name: Thomas [name] MRN: [id number] Date of Birth: 12-28-1955  Today's Date: 08/28/2017 PT Individual Time: 1036-1159 PT Individual Time Calculation (min): 83 min   Short Term Goals: Week 1:  PT Short Term Goal 1 (Week 1): Pt will ascend/descend 4 steps with AD and no handrails, min assist PT Short Term Goal 2 (Week 1): Pt will maintain dynamic standing balance without UE support, min assist PT Short Term Goal 3 (Week 1): Pt will perform bed<>w/c transfers with supervision   Skilled Therapeutic Interventions/Progress Updates:  Pt received in bed & agreeable to tx. No c/o pain reported but pt requires frequent seated rest breaks 2/2 ongoing fatigue. Pt transferred to EOB & donned shoes & R foot-up brace mod I. Pt reports need to use restroom and transfers bed>w/c with mod I via squat pivot. Educated pt on need to lock w/c brakes and pt able to do so. Pt propels into bathroom with supervision but requires min assist when backing out of bathroom 2/2 small incline at door and pt with posterior LOB in w/c 2/2 displaced COG - educated pt to propel forwards out of bathroom. Pt completes w/c<>toilet transfer with distant supervision>mod I and completes clothing management and peri hygiene with mod I. Educated pt on w/c parts management (brakes, legrests) with pt able to return demonstrate during session. Pt propels w/c room>ortho gym Mod I with BUE. Pt completes car transfer at [place] simulated height (pt unsure what he will be riding home in) with RW & min assist with cuing to sit on seat then transfer LE in/out of car. Pt requires cuing throughout session for proper hand placement to push up for sit>stand. Pt completes bed mobility with mod I, and transfers on/off couch in apartment with mod I (couch>w/c). Pt ambulates throughout apartment and unit with RW & steady assist with education regarding need for someone to be holding on to him when ambulating or  negotiating stairs once home. Pt negotiates 12 steps (6") with B rails and steady assist with cuing for compensatory pattern. Pt completed Berg Balance Test & scored 20/56; educated pt on interpretation of score, current fall risk, and safety recommendations at home (only ambulate with assistance from caregiver, always use RW). At end of session pt left in bed with alarm set & all needs within reach.  At end of session pt reports pain in hips 2/2 sitting up which is why he requested to return to bed.  Continued to educate pt on need to use w/c when home without assistance during the day, and need for someone to be holding him when ambulating with RW or negotiating stairs.   Therapy Documentation Precautions:  Precautions Precautions: Fall Precaution Comments: hx of dizziness episodes Required Braces or Orthoses: Other Brace/Splint(Foot-up brace when ambulating) Restrictions Weight Bearing Restrictions: No  Balance: Balance Balance Assessed: Yes Standardized Balance Assessment Standardized Balance Assessment: Berg Balance Test Berg Balance Test Sit to Stand: Able to stand  independently using hands Standing Unsupported: Able to stand 2 minutes with supervision Sitting with Back Unsupported but Feet Supported on Floor or Stool: Able to sit safely and securely 2 minutes Stand to Sit: Sits safely with minimal use of hands Transfers: Able to transfer safely, definite need of hands Standing Unsupported with Eyes Closed: Able to stand 3 seconds Standing Ubsupported with Feet Together: Needs help to attain position and unable to hold for 15 seconds From Standing, Reach Forward with Outstretched Arm: Reaches forward but  needs supervision From Standing Position, Pick up Object from Floor: Unable to try/needs assist to keep balance From Standing Position, Turn to Look Behind Over each Shoulder: Needs assist to keep from losing balance and falling Turn 360 Degrees: Needs assistance while  turning Standing Unsupported, Alternately Place Feet on Step/Stool: Needs assistance to keep from falling or unable to try Standing Unsupported, One Foot in Front: Loses balance while stepping or standing Standing on One Leg: Unable to try or needs assist to prevent fall Total Score: 20   See Function Navigator for Current Functional Status.   Therapy/Group: Individual Therapy  [name] 08/28/2017, 12:24 PM

## 2017-08-28 NOTE — Progress Notes (Signed)
Occupational Therapy Session Note  Patient Details  Name: Thomas [name] MRN: [id number] Date of Birth: 01/26/1956  Today's Date: 08/28/2017 OT Individual Time: 7425-9563 OT Individual Time Calculation (min): 65 min    Short Term Goals: Week 1:  OT Short Term Goal 1 (Week 1): Pt will perform bathing at shower level with supervision for safety and balance.   OT Short Term Goal 2 (Week 1): Pt will attend to right side of body and environment in functional tasks with 1 verbal cue.   OT Short Term Goal 3 (Week 1): Pt will utilize correct hand placement with no verbal cues.    Skilled Therapeutic Interventions/Progress Updates:    Pt seen for OT ADL bathing/dressing session. Pt in supine upon arrival, agreeable to tx session. Discussed new plans for going home at w/c level when supervision not available, pt believes w/c will fit into home bathroom. Practiced w/c mobility within room, navigating into bathroom and practicing stand pivot transfers from w/c to toilet without use of grab bars. Pt still able to complete toilet transfers and toileting task at mod I level using this method. Cont to practice functional ambulation as well, pt ambulating into bathroom to complete shower transfer, VCs for RW management in functional context as well as hand placement during sit<> stand. He bathed with distant supervision seated on tub bench. He dressed mod I seated on toilet, standing with RW to pull pant sup. He returned to bed to don shoes, with slightly increased time able to don  Foot-up brace independently.  Pt self propelled w/c throughout unit to ADL apartment with supervision. In ADL apartment, completed w/c management in bathroom in prep for d/c home, able to correctly set-up and complete stand pivot transfer to toilet in simulation of home environment- completed mod I. Pt then completed simulated tub/shower transfer utilizing tub transfer bench, completed with supervision, able to recall technique for transfer  taught in previous tx session.  Pt ambulated from ADL apartment back to room with close supervision and VCs for ambulation speed and awareness of veering R.  Pt left seated on EOB at end of session, ned alarm on and all needs in reach.   Throughout session, education provided regarding pt's high fall risk, steps to decrease fall risk and increase safety awareness,   Therapy Documentation Precautions:  Precautions Precautions: Fall Restrictions Weight Bearing Restrictions: No Pain:   No/denies pain  See Function Navigator for Current Functional Status.   Therapy/Group: Individual Therapy  Ravina Milner L 08/28/2017, 7:00 AM

## 2017-08-28 NOTE — Progress Notes (Signed)
Speech Language Pathology Discharge Summary  Patient Details  Name: Thomas [name] MRN: [id number] Date of Birth: 1955/12/25  Today's Date: 08/28/2017 SLP Individual Time: 1300-1400 SLP Individual Time Calculation (min): 60 min   Skilled Therapeutic Interventions:   Skilled ST services focused on cognitive skills. SLP facilitated reassessment of executive function skills utilizing CLQT pt scored 23, one point away from [facility], but within the mild deficit category, which was similar to evaluation score. SLP education pt about need for supervision A for medication management and money management, pt agreed to supervision, but expressed that 24 hour supervision will not be provided for physical needs. Pt was left in room with call bell within reach. Reccomend to continue skilled ST services.    Patient has met 2 of 2 long term goals.  Patient to discharge at overall Supervision;Modified Independent level.  Reasons goals not met:     Clinical Impression/Discharge Summary:    Pt demonstrated great progress meeting 2 out 2 goals, discharging supervision A -Mod I level. Pt demonstrated Mod I abilities in semi-complex problem solving skills and anticipatory awareness, however requires supervision A when medical health is impaired and believed to be at baseline cognitive function. Pt demonstrated similar score on CLQT,evaluation date and reassessed at discharge, demonstrating mild deficits in executive function, one point away from [facility]. SLP recommends 24 hour supervision A, however pt has expressed that 24 hours supervision A will not be available leading to safety risk.  Pt benefited from skilled ST services in order to maximize functional independence and reduce burden of care, recommending discharge with 24 hour supervision A. .   Care Partner:  Caregiver Able to Provide Assistance: Yes  Type of Caregiver Assistance: Cognitive;Physical  Recommendation:  None(believed to be baseline)      Equipment:      Reasons for discharge: Discharged from hospital       Function:  Eating Eating                 Cognition Comprehension Comprehension assist level: Understands complex 90% of the time/cues 10% of the time  Expression   Expression assist level: Expresses complex 90% of the time/cues < 10% of the time  Social Interaction Social Interaction assist level: Interacts appropriately with others - No medications needed.  Problem Solving Problem solving assist level: Solves complex 90% of the time/cues < 10% of the time  Memory Memory assist level: Recognizes or recalls 90% of the time/requires cueing < 10% of the time   Thomas [name]  [facility] 08/28/2017, 5:02 PM

## 2017-08-28 NOTE — Progress Notes (Addendum)
ANTICOAGULATION CONSULT NOTE - Follow Up Consult  Pharmacy Consult for Coumadin Indication: Aortic embolus>CVA  No Known Allergies  Patient Measurements: Height: 5\' 7"  (170.2 cm) Weight: 118 lb 9.7 oz (53.8 kg) IBW/kg (Calculated) : 66.1  Vital Signs: Temp: 98.6 F (37 C) (06/04 0419) Temp Source: Oral (06/04 0419) BP: 129/81 (06/04 0419) Pulse Rate: 78 (06/04 0419)  Labs: Recent Labs    08/26/17 1053 08/27/17 0703 08/28/17 0458  HGB 12.0* 12.8*  --   HCT 38.5* 39.7  --   PLT 356 373  --   LABPROT 24.8* 22.5* 25.3*  INR 2.26 2.00 2.33  CREATININE  --   --  1.20    Estimated Creatinine Clearance: 47.9 mL/min (by C-G formula based on SCr of 1.2 mg/dL).  Assessment:  Anticoag: lovenox to coumadin bridge complete- soft clot in aorta thought to be source of embolism. Hgb improved 12.8. Plts stable on 6/3.  INR 1.99>2.25>2.78>2.26>2.0> 2.33 today No bleeding reported.  Goal of Therapy:  INR 2-3 Monitor platelets by anticoagulation protocol: Yes   Plan:  Warfarin 5 mg x 1 again today Daily INR If INR remains therapeutic/stable tomorrow, consider discharge on 5mg  daily.  If INR increases slightly above goal, consider lower dose 5mg  daily except 2.5mg  every Wed and Saturday.  Will need INR checked weekly.    Nicole Cella, [place] Clinical Pharmacist [phone]: [phone] 15:30-2215: [place] [phone] 08/28/2017,12:02 PM

## 2017-08-28 NOTE — Progress Notes (Signed)
Pt educated to the effects and risks of constipation. Pt was offered senna. Pt refused any medication stating " I don't need anything for my bowels. I am fine."

## 2017-08-28 NOTE — Progress Notes (Addendum)
Subjective/Complaints: No issues overnite, has balance issues and not safe to ambulated independantly per OT Mod I bed mob and Xfer  Review of systems: Denies CP, SOB, nausea, vomiting, diarrhea.  Objective: Vital Signs: Blood pressure 129/81, pulse 78, temperature 98.6 F (37 C), temperature source Oral, resp. rate 18, height '5\' 7"'  (1.702 m), weight 53.8 kg (118 lb 9.7 oz), SpO2 99 %. No results found. Results for orders placed or performed during the hospital encounter of 08/18/17 (from the past 72 hour(s))  Protime-INR     Status: Abnormal   Collection Time: 08/26/17 10:53 AM  Result Value Ref Range   Prothrombin Time 24.8 (H) 11.4 - 15.2 seconds   INR 2.26     Comment: Performed at [facility] [address], [place], [place] [address]  CBC     Status: Abnormal   Collection Time: 08/26/17 10:53 AM  Result Value Ref Range   WBC 11.4 (H) 4.0 - 10.5 K/uL   RBC 4.78 4.22 - 5.81 MIL/uL   Hemoglobin 12.0 (L) 13.0 - 17.0 g/dL   HCT 38.5 (L) 39.0 - 52.0 %   MCV 80.5 78.0 - 100.0 fL   MCH 25.1 (L) 26.0 - 34.0 pg   MCHC 31.2 30.0 - 36.0 g/dL   RDW 16.0 (H) 11.5 - 15.5 %   Platelets 356 150 - 400 K/uL    Comment: Performed at [facility], [place] [address] [place], [place] [address]  Protime-INR     Status: Abnormal   Collection Time: 08/27/17  7:03 AM  Result Value Ref Range   Prothrombin Time 22.5 (H) 11.4 - 15.2 seconds   INR 2.00     Comment: Performed at [facility] [address] [place], [place] [address]  CBC     Status: Abnormal   Collection Time: 08/27/17  7:03 AM  Result Value Ref Range   WBC 11.2 (H) 4.0 - 10.5 K/uL   RBC 5.01 4.22 - 5.81 MIL/uL   Hemoglobin 12.8 (L) 13.0 - 17.0 g/dL   HCT 39.7 39.0 - 52.0 %   MCV 79.2 78.0 - 100.0 fL   MCH 25.5 (L) 26.0 - 34.0 pg   MCHC 32.2 30.0 - 36.0 g/dL   RDW 16.0 (H) 11.5 - 15.5 %   Platelets 373 150 - 400 K/uL    Comment: Performed at [facility], [place] [address] [place], [place] [address]   Protime-INR     Status: Abnormal   Collection Time: 08/28/17  4:58 AM  Result Value Ref Range   Prothrombin Time 25.3 (H) 11.4 - 15.2 seconds   INR 2.33     Comment: Performed at [facility] [address] [place],  [address]  Basic metabolic panel     Status: Abnormal   Collection Time: 08/28/17  4:58 AM  Result Value Ref Range   Sodium 139 135 - 145 mmol/L   Potassium 3.8 3.5 - 5.1 mmol/L   Chloride 105 101 - 111 mmol/L   CO2 26 22 - 32 mmol/L   Glucose, Bld 93 65 - 99 mg/dL   BUN 22 (H) 6 - 20 mg/dL   Creatinine, Ser 1.20 0.61 - 1.24 mg/dL   Calcium 8.8 (L) 8.9 - 10.3 mg/dL   GFR calc non Af Amer >60 >60 mL/min   GFR calc Af Amer >60 >60 mL/min    Comment: (NOTE) The eGFR has been calculated using the CKD EPI equation. This calculation  has not been validated in all clinical situations. eGFR's persistently <60 mL/min signify possible Chronic Kidney Disease.    Anion gap 8 5 - 15    Comment: Performed at [facility] [address], [place], [place] [address]     Constitutional: No distress . Vital signs reviewed. HENT: Normocephalic.  Atraumatic. Eyes: EOMI. No discharge. Cardiovascular: RRR. No JVD. Respiratory: CTA Bilaterally. Normal effort. GI: BS +. Non-distended. Musc: No edema or tenderness in extremities. Neuro: Alert Motor: Grossly 4+/5 throughout No ataxia noted in bilateral upper extremities. Gen NAD.  Vital signs reviewed.   Assessment/Plan: 1. Functional deficits secondary to , left upper ext ataxia, gait disorder CVA  which require 3+ hours per day of interdisciplinary therapy in a comprehensive inpatient rehab setting. Physiatrist is providing close team supervision and 24 hour management of active medical problems listed below. Physiatrist and rehab team continue to assess barriers to discharge/monitor patient progress toward functional and medical goals. FIM: Function - Bathing Position: Shower Body parts bathed by patient:  Right arm, Left arm, Chest, Abdomen, Right upper leg, Left upper leg, Right lower leg, Left lower leg, Front perineal area, Buttocks, Back Body parts bathed by helper: Buttocks, Back Assist Level: Supervision or verbal cues  Function- Upper Body Dressing/Undressing What is the patient wearing?: Pull over shirt/dress Pull over shirt/dress - Perfomed by patient: Thread/unthread right sleeve, Thread/unthread left sleeve, Put head through opening, Pull shirt over trunk Assist Level: Set up Set up : To obtain clothing/put away Function - Lower Body Dressing/Undressing What is the patient wearing?: Underwear, Pants, Non-skid slipper socks Position: Other (comment)(sitting on toilet) Underwear - Performed by patient: Thread/unthread right underwear leg, Thread/unthread left underwear leg, Pull underwear up/down Pants- Performed by patient: Thread/unthread right pants leg, Thread/unthread left pants leg, Pull pants up/down Non-skid slipper socks- Performed by patient: Don/doff right sock, Don/doff left sock Shoes - Performed by patient: Don/doff right shoe, Don/doff left shoe, Fasten right, Fasten left Assist for footwear: Setup Assist for lower body dressing: Set up  Function - Toileting Toileting steps completed by patient: Adjust clothing prior to toileting, Performs perineal hygiene, Adjust clothing after toileting Toileting steps completed by helper: Adjust clothing prior to toileting, Adjust clothing after toileting Toileting Assistive Devices: Grab bar or rail Assist level: More than reasonable time  Function - Air cabin crew transfer assistive device: Walker, Elevated toilet seat/BSC over toilet Assist level to toilet: Supervision or verbal cues Assist level from toilet: Supervision or verbal cues  Function - Chair/bed transfer Chair/bed transfer method: Stand pivot Chair/bed transfer assist level: Supervision or verbal cues Chair/bed transfer assistive device: Walker,  Armrests Chair/bed transfer details: Verbal cues for safe use of DME/AE  Function - Locomotion: Wheelchair Will patient use wheelchair at discharge?: No Type: Manual Max wheelchair distance: 150 ft(BUE) Assist Level: Supervision or verbal cues Assist Level: Supervision or verbal cues Assist Level: Supervision or verbal cues Turns around,maneuvers to table,bed, and toilet,negotiates 3% grade,maneuvers on rugs and over doorsills: No Function - Locomotion: Ambulation Assistive device: Walker-rolling(R foot up brace) Max distance: 150 ft  Assist level: Supervision or verbal cues(very close supervision) Assist level: Supervision or verbal cues Assist level: Supervision or verbal cues Assist level: Supervision or verbal cues Walk 10 feet on uneven surfaces activity did not occur: Safety/medical concerns  Function - Comprehension Comprehension: Auditory Comprehension assist level: Understands complex 90% of the time/cues 10% of the time  Function - Expression Expression: Verbal Expression assist level: Expresses complex 90% of the time/cues <  10% of the time  Function - Social Interaction Social Interaction assist level: Interacts appropriately with others - No medications needed.  Function - Problem Solving Problem solving assist level: Solves complex 90% of the time/cues < 10% of the time  Function - Memory Memory assist level: Recognizes or recalls 90% of the time/requires cueing < 10% of the time Patient normally able to recall (first 3 days only): Current season, Staff names and faces, That he or she is in a hospital  Medical Problem List and Plan: 1. Paraparesis lower extremity sensory changes and gait disorderhas had a small cerebellar infarcts left cervicomedullary jct infarct which may result in LLE weakness given that Corticospinal tracts have crossed  Cont CIR PT, OT,plan d/c in am 2. DVT Prophylaxis/Anticoagulation: Pharmaceutical:Coumadin with low dose ASA-   INR  therapeutic on 6/3, no longer need daily CBC given pt is off lovenox 3. Pain Management:tylenol prn. Added low dose gabapentin to help with dysesthesias. 4. Mood:LCSW to follow for evaluation and support. 5. Neuropsych: This patientis?  Fully capable of making decisions on hisown behalf. 6. Skin/Wound Care:Monitor wound for healing. Maintain adequate nutritional and hydration Status.  7. Fluids/Electrolytes/Nutrition:Monitor I/Os  628m recorded yesterday with meals, D/C IV- BMET normal 6/4 boerderline elevation BUN -enc po fluid 8. HTN: Monitor BP bid Vitals:   08/27/17 2113 08/28/17 0419  BP: 131/72 129/81  Pulse: 85 78  Resp: 16 18  Temp: 98.2 F (36.8 C) 98.6 F (37 C)  SpO2: 100% 99%   Controlled on 6/4 9. Ruptured aneurysm s/p repair with subsequent Endarterectomy: Monitor BP bid. Controlled at this time 10. CKD 1: Monitor for stability. Avoid nephrotoxic medication. Baseline  SCr 1.27 on 5/31 , 1.20 on 6/4 11. Dyslipidemia: on Lipitor 827md- No muscle pain 12. Leukocytosis  WBCs 11.1 on 6/1, stable , minimal elevation 11.2   Afebrile   13. anemia  Hemoglobin 11.6 on 6/1, improved to 12.8 on 6/3    LOS (Days) 10 A FACE TO FACE EVALUATION WAS PERFORMED  AnCharlett Blake/06/2017, 8:43 AM

## 2017-08-28 NOTE — Progress Notes (Addendum)
Occupational Therapy Discharge Summary  Patient Details  Name: Thomas [name] MRN: [id number] Date of Birth: March 21, 1956   Patient has met 55 of 30 long term goals due to improved activity tolerance, improved balance, postural control, functional use of  RIGHT upper extremity, improved awareness and improved coordination.  Patient to discharge at overall Supervision- mod I level.  PT is recommending supervision with all ambulation and therefore to remain at w/c level when family is not present to provide supervision. Pt will be alone during the day while family is at work. He is mod I with basic ADLs with exception of supervision for tub/shower transfer utilizing tub transfer bench.  Supervision recommended for all IADLs. Hands on family education completed with pt's daughter though she reports she will not be the primary caregiver at d/c.   Reasons goals not met: Pt is mod I for dynamic standing balance needed during toileting tasks, however, recommending supervision for all other dynamic standing tasks and ambulation.  Recommendation:  Patient will benefit from ongoing skilled OT services in home health setting to continue to advance functional skills in the area of BADL, iADL and Reduce care partner burden. CSW reports pt does not qualify for insurance coverage of follow up care, however, pt will benefit from continued services in order to increase safety and independence with ADLs/IADLs.  Equipment: Tub transfer bench, RW, w/c  Reasons for discharge: treatment goals met and discharge from hospital  Patient/family agrees with progress made and goals achieved: Yes  OT Discharge Precautions/Restrictions  Precautions Precautions: Fall Precaution Comments: hx of dizziness episodes Required Braces or Orthoses: Other Brace/Splint(Foot-up brace when ambulating) Restrictions Weight Bearing Restrictions: No Vision Baseline Vision/History: Wears glasses Wears Glasses: Reading only Patient Visual  Report: No change from baseline(Reports vision has returned to PLOF) Vision Assessment?: Yes Eye Alignment: Within Functional Limits Visual Fields: No apparent deficits Perception  Perception: Within Functional Limits Praxis Praxis: Intact Cognition Overall Cognitive Status: Impaired/Different from baseline Arousal/Alertness: Awake/alert Orientation Level: Oriented X4 Memory: Impaired Memory Impairment: Decreased recall of new information Decreased Short Term Memory: Verbal basic;Functional basic Awareness: Impaired Awareness Impairment: Anticipatory impairment Problem Solving: Appears intact Safety/Judgment: Impaired Comments: Decreased safety awareness/ awareness of deficits Sensation Sensation Light Touch: Appears Intact Proprioception: Appears Intact Coordination Gross Motor Movements are Fluid and Coordinated: Yes Fine Motor Movements are Fluid and Coordinated: Yes Coordination and Movement Description: Decreased dynamic standing balance 2/2 LE weakness Finger Nose Finger Test: [facility] Motor  Motor Motor: Ataxia;Hemiplegia Motor - Discharge Observations: generalized weakness & deconditioning, weak hip flexors (R>L), mild R hemi (RLE) Trunk/Postural Assessment  Cervical Assessment Cervical Assessment: Within Functional Limits Thoracic Assessment Thoracic Assessment: Within Functional Limits Lumbar Assessment Lumbar Assessment: Within Functional Limits Postural Control Postural Control: Deficits on evaluation Protective Responses: Due to LE weakness  Balance Balance Balance Assessed: Yes Static Sitting Balance Static Sitting - Level of Assistance: 6: Modified independent (Device/Increase time) Dynamic Sitting Balance Dynamic Sitting - Balance Support: During functional activity;Feet supported Dynamic Sitting - Level of Assistance: 6: Modified independent (Device/Increase time) Sitting balance - Comments: Sitting EOB during functional task Static Standing  Balance Static Standing - Balance Support: During functional activity;Right upper extremity supported;Left upper extremity supported Static Standing - Level of Assistance: 6: Modified independent (Device/Increase time);5: Stand by assistance Dynamic Standing Balance Dynamic Standing - Balance Support: During functional activity;Right upper extremity supported;Left upper extremity supported Dynamic Standing - Level of Assistance: 5: Stand by assistance;6: Modified independent (Device/Increase time) Extremity/Trunk Assessment RUE Assessment RUE Assessment: Within Functional Limits(4+/5)  LUE Assessment LUE Assessment: Within Functional Limits   See Function Navigator for Current Functional Status.  Thomas [name] L 08/28/2017, 7:26 AM

## 2017-08-29 ENCOUNTER — Inpatient Hospital Stay (HOSPITAL_COMMUNITY): Payer: Private Health Insurance - Indemnity | Admitting: Occupational Therapy

## 2017-08-29 ENCOUNTER — Inpatient Hospital Stay (HOSPITAL_COMMUNITY): Payer: Private Health Insurance - Indemnity | Admitting: Physical Therapy

## 2017-08-29 DIAGNOSIS — N181 Chronic kidney disease, stage 1: Secondary | ICD-10-CM

## 2017-08-29 LAB — PROTIME-INR
INR: 2.33
Prothrombin Time: 25.3 seconds — ABNORMAL HIGH (ref 11.4–15.2)

## 2017-08-29 MED ORDER — PANTOPRAZOLE SODIUM 40 MG PO TBEC: 40.0000 mg | DELAYED_RELEASE_TABLET | Freq: Every day | ORAL | 1 refills | Status: DC

## 2017-08-29 MED ORDER — WARFARIN SODIUM 5 MG PO TABS: 5.0000 mg | ORAL_TABLET | Freq: Every day | ORAL | 0 refills | Status: DC

## 2017-08-29 MED ORDER — GABAPENTIN 100 MG PO CAPS: 100.0000 mg | ORAL_CAPSULE | Freq: Every day | ORAL | 0 refills | Status: DC

## 2017-08-29 MED ORDER — WARFARIN SODIUM 5 MG PO TABS: 5.0000 mg | ORAL_TABLET | Freq: Once | ORAL | Status: DC

## 2017-08-29 MED ORDER — ATORVASTATIN CALCIUM 80 MG PO TABS: 80.0000 mg | ORAL_TABLET | Freq: Every day | ORAL | 0 refills | Status: DC

## 2017-08-29 MED ORDER — ASPIRIN 81 MG PO TBEC: 81.0000 mg | DELAYED_RELEASE_TABLET | Freq: Every day | ORAL | 0 refills | Status: DC

## 2017-08-29 NOTE — Plan of Care (Signed)
  Problem: Consults Goal: RH STROKE PATIENT EDUCATION Description See Patient Education module for education specifics  Outcome: Progressing Goal: Nutrition Consult-if indicated Outcome: Progressing   Problem: RH SKIN INTEGRITY Goal: RH STG SKIN FREE OF INFECTION/BREAKDOWN Description Skin to remain free from breakdown while on rehab with supervision from staff.  Outcome: Progressing Goal: RH STG MAINTAIN SKIN INTEGRITY WITH ASSISTANCE Description STG Maintain Skin Integrity With Supervision Assistance.  Outcome: Progressing   Problem: RH SAFETY Goal: RH STG ADHERE TO SAFETY PRECAUTIONS W/ASSISTANCE/DEVICE Description STG Adhere to Safety Precautions With min Assistance and appropriate assistive Device.  Outcome: Progressing Goal: RH STG DECREASED RISK OF FALL WITH ASSISTANCE Description STG Decreased Risk of Fall With min Assistance.  Outcome: Progressing   Problem: RH PAIN MANAGEMENT Goal: RH STG PAIN MANAGED AT OR BELOW PT'S PAIN GOAL Description <3 on a 0-10 pain scale  Outcome: Progressing

## 2017-08-29 NOTE — Progress Notes (Signed)
Subjective/Complaints:  No issues overnite, discussed need for vascular f/u post d/c  Review of systems: Denies CP, SOB, nausea, vomiting, diarrhea.  Objective: Vital Signs: Blood pressure 126/88, pulse 83, temperature 98 F (36.7 C), temperature source Oral, resp. rate 16, height _0  (1.702 m), weight 54 kg (119 lb 0.8 oz), SpO2 99 %. No results found. Results for orders placed or performed during the hospital encounter of 08/18/17 (from the past 72 hour(s))  Protime-INR     Status: Abnormal   Collection Time: 08/26/17 10:53 AM  Result Value Ref Range   Prothrombin Time 24.8 (H) 11.4 - 15.2 seconds   INR 2.26     Comment: Performed at [facility] [address] [place], [place] [address]  CBC     Status: Abnormal   Collection Time: 08/26/17 10:53 AM  Result Value Ref Range   WBC 11.4 (H) 4.0 - 10.5 K/uL   RBC 4.78 4.22 - 5.81 MIL/uL   Hemoglobin 12.0 (L) 13.0 - 17.0 g/dL   HCT 38.5 (L) 39.0 - 52.0 %   MCV 80.5 78.0 - 100.0 fL   MCH 25.1 (L) 26.0 - 34.0 pg   MCHC 31.2 30.0 - 36.0 g/dL   RDW 16.0 (H) 11.5 - 15.5 %   Platelets 356 150 - 400 K/uL    Comment: Performed at [facility], [place] [address] [place], [place] [address]  Protime-INR     Status: Abnormal   Collection Time: 08/27/17  7:03 AM  Result Value Ref Range   Prothrombin Time 22.5 (H) 11.4 - 15.2 seconds   INR 2.00     Comment: Performed at [facility] [address], [place], [place] [address]  CBC     Status: Abnormal   Collection Time: 08/27/17  7:03 AM  Result Value Ref Range   WBC 11.2 (H) 4.0 - 10.5 K/uL   RBC 5.01 4.22 - 5.81 MIL/uL   Hemoglobin 12.8 (L) 13.0 - 17.0 g/dL   HCT 39.7 39.0 - 52.0 %   MCV 79.2 78.0 - 100.0 fL   MCH 25.5 (L) 26.0 - 34.0 pg   MCHC 32.2 30.0 - 36.0 g/dL   RDW 16.0 (H) 11.5 - 15.5 %   Platelets 373 150 - 400 K/uL    Comment: Performed at [facility], [place] [address], [place], [place] [address]  Protime-INR     Status: Abnormal   Collection  Time: 08/28/17  4:58 AM  Result Value Ref Range   Prothrombin Time 25.3 (H) 11.4 - 15.2 seconds   INR 2.33     Comment: Performed at [facility] [address], [place], [place] [address]  Basic metabolic panel     Status: Abnormal   Collection Time: 08/28/17  4:58 AM  Result Value Ref Range   Sodium 139 135 - 145 mmol/L   Potassium 3.8 3.5 - 5.1 mmol/L   Chloride 105 101 - 111 mmol/L   CO2 26 22 - 32 mmol/L   Glucose, Bld 93 65 - 99 mg/dL   BUN 22 (H) 6 - 20 mg/dL   Creatinine, Ser 1.20 0.61 - 1.24 mg/dL   Calcium 8.8 (L) 8.9 - 10.3 mg/dL   GFR calc non Af Amer >60 >60 mL/min   GFR calc Af Amer >60 >60 mL/min    Comment: (NOTE) The eGFR has been calculated using the CKD EPI equation. This calculation has not been validated in all clinical situations. eGFR's  persistently <60 mL/min signify possible Chronic Kidney Disease.    Anion gap 8 5 - 15    Comment: Performed at [facility] [address], [place], [place] [address]  Protime-INR     Status: Abnormal   Collection Time: 08/29/17  7:01 AM  Result Value Ref Range   Prothrombin Time 25.3 (H) 11.4 - 15.2 seconds   INR 2.33     Comment: Performed at [facility] [address], [place], [place] [address]     Constitutional: No distress . Vital signs reviewed. HENT: Normocephalic.  Atraumatic. Eyes: EOMI. No discharge. Cardiovascular: RRR. No JVD. Respiratory: CTA Bilaterally. Normal effort. GI: BS +. Non-distended. Musc: No edema or tenderness in extremities. Neuro: Alert Motor: Grossly 4+/5 throughout No ataxia noted in bilateral upper extremities. Gen NAD.  Vital signs reviewed.   Assessment/Plan: 1. Functional deficits secondary to , left upper ext ataxia, gait disorder CVA   Stable for D/C today F/u PCP in 3-4 weeks F/u PM&R 2 weeks See D/C summary See D/C instructions No driving FIM: Function - Bathing Position: Shower Body parts bathed by patient: Right arm, Left arm, Chest,  Abdomen, Right upper leg, Left upper leg, Right lower leg, Left lower leg, Front perineal area, Buttocks, Back Body parts bathed by helper: Buttocks, Back Assist Level: Supervision or verbal cues  Function- Upper Body Dressing/Undressing What is the patient wearing?: Pull over shirt/dress Pull over shirt/dress - Perfomed by patient: Thread/unthread right sleeve, Thread/unthread left sleeve, Put head through opening, Pull shirt over trunk Assist Level: More than reasonable time Set up : To obtain clothing/put away Function - Lower Body Dressing/Undressing What is the patient wearing?: Underwear, Pants, Non-skid slipper socks Position: Other (comment)(Sitting on toilet) Underwear - Performed by patient: Thread/unthread right underwear leg, Thread/unthread left underwear leg, Pull underwear up/down Pants- Performed by patient: Thread/unthread left pants leg, Pull pants up/down, Thread/unthread right pants leg, Fasten/unfasten pants Non-skid slipper socks- Performed by patient: Don/doff right sock, Don/doff left sock Shoes - Performed by patient: Don/doff right shoe, Don/doff left shoe, Fasten right, Fasten left Assist for footwear: Setup Assist for lower body dressing: More than reasonable time  Function - Toileting Toileting steps completed by patient: Adjust clothing prior to toileting, Performs perineal hygiene, Adjust clothing after toileting Toileting steps completed by helper: Adjust clothing prior to toileting, Adjust clothing after toileting Toileting Assistive Devices: Grab bar or rail Assist level: More than reasonable time  Function - Air cabin crew transfer assistive device: Walker, Elevated toilet seat/BSC over toilet Assist level to toilet: No Help, no cues, assistive device, takes more than a reasonable amount of time Assist level from toilet: No Help, no cues, assistive device, takes more than a reasonable amount of time  Function - Chair/bed transfer Chair/bed  transfer method: Squat pivot Chair/bed transfer assist level: No Help, no cues, assistive device, takes more than a reasonable amount of time Chair/bed transfer assistive device: Armrests Chair/bed transfer details: Verbal cues for safe use of DME/AE  Function - Locomotion: Wheelchair Will patient use wheelchair at discharge?: No Type: Manual Max wheelchair distance: 150 ft  Assist Level: No help, No cues, assistive device, takes more than reasonable amount of time Assist Level: No help, No cues, assistive device, takes more than reasonable amount of time Assist Level: No help, No cues, assistive device, takes more than reasonable amount of time Turns around,maneuvers to table,bed, and toilet,negotiates 3% grade,maneuvers on rugs and over doorsills: No Function - Locomotion: Ambulation Assistive device:  Walker-rolling(R foot up brace) Max distance: 168 ft  Assist level: Touching or steadying assistance (Pt > 75%) Assist level: Touching or steadying assistance (Pt > 75%) Assist level: Touching or steadying assistance (Pt > 75%) Assist level: Touching or steadying assistance (Pt > 75%) Walk 10 feet on uneven surfaces activity did not occur: Safety/medical concerns Assist level: Touching or steadying assistance (Pt > 75%)  Function - Comprehension Comprehension: Auditory Comprehension assist level: Understands complex 90% of the time/cues 10% of the time  Function - Expression Expression: Verbal Expression assist level: Expresses complex 90% of the time/cues < 10% of the time  Function - Social Interaction Social Interaction assist level: Interacts appropriately with others - No medications needed.  Function - Problem Solving Problem solving assist level: Solves complex 90% of the time/cues < 10% of the time  Function - Memory Memory assist level: Recognizes or recalls 90% of the time/requires cueing < 10% of the time Patient normally able to recall (first 3 days only): Current  season, Staff names and faces, That he or she is in a hospital  Medical Problem List and Plan: 1. 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 DVT Prophylaxis/Anticoagulation: Pharmaceutical:Coumadin with low dose ASA-   INR therapeutic on 6/5 3. Pain Management:tylenol prn. Added low dose gabapentin to help with dysesthesias. 4. Mood:LCSW to follow for evaluation and support. 5. Neuropsych: This patientis?  Fully capable of making decisions on hisown behalf. 6. Skin/Wound Care:Monitor wound for healing. Maintain adequate nutritional and hydration Status.  7. Fluids/Electrolytes/Nutrition:Monitor I/Os  8. HTN: Monitor BP bid Vitals:   08/28/17 1950 08/29/17 0433  BP: 130/72 126/88  Pulse: 91 83  Resp: 18 16  Temp: 98 F (36.7 C) 98 F (36.7 C)  SpO2: 99% 99%   Controlled on 6/5 9. Ruptured aneurysm s/p repair with subsequent Endarterectomy: Monitor BP bid. Controlled at this time 10. CKD 1: Monitor for stability. Avoid nephrotoxic medication. Baseline  SCr 1.27 on 5/31 , 1.20 on 6/4 11. Dyslipidemia: on Lipitor 50m/d- No muscle pain 12. Leukocytosis  WBCs 11.1 on 6/1, stable , minimal elevation 11.2   Afebrile   13. anemia  Hemoglobin 11.6 on 6/1, improved to 12.8 on 6/3    LOS (Days) 11 A FACE TO FACE EVALUATION WAS PERFORMED  ACharlett Blake6/07/2017, 8:51 AM

## 2017-08-29 NOTE — Progress Notes (Signed)
Social Work Discharge Note  The overall goal for the admission was met for:   Discharge location: Yes - pt's home  Length of Stay: Yes - 11 days  Discharge activity level: Yes - mod I from wheelchair; supervision for ambulation and showering  Home/community participation: Yes  Services provided included: MD, RD, PT, OT, SLP, RN, Pharmacy and SW  Financial Services: Private Insurance: [employer]  Follow-up services arranged: DME: 16"x16" lightweight wheelchair with basic cushion purchased from [employer].  Pt has borrowed rolling walker and tub transfer bench. and Patient/Family has no preference for HH/DME agencies  Comments (or additional information): Pt's dtr came in for family education on day of d/c to learn what pt needs supervision for, although she told OT she would not provide supervision for showering.  Pt plans to use wheelchair in the home as much as possible.  Patient/Family verbalized understanding of follow-up arrangements: Yes  Individual responsible for coordination of the follow-up plan: pt and dtr to help as she can with transportation to appointments, errands, etc.  Confirmed correct DME delivered: Trey Sailors 08/29/2017    Wylie Russon, Silvestre Mesi

## 2017-08-29 NOTE — Progress Notes (Signed)
Occupational Therapy Session Note  Patient Details  Name: Thomas [name] MRN: [id number] Date of Birth: May 30, 1955  Today's Date: 08/29/2017 OT Individual Time: 1130-1200 OT Individual Time Calculation (min): 30 min    Short Term Goals: Week 1:  OT Short Term Goal 1 (Week 1): Pt will perform bathing at shower level with supervision for safety and balance.   OT Short Term Goal 2 (Week 1): Pt will attend to right side of body and environment in functional tasks with 1 verbal cue.   OT Short Term Goal 3 (Week 1): Pt will utilize correct hand placement with no verbal cues.    Skilled Therapeutic Interventions/Progress Updates:    Pt seen for OT family education session. Pt received in w/c with hand off from PT, pt's daughter present for family education. She reports that she will not be caregiver for pt or provide the supervision assist being recommended at d/c.  In ADL apartment, completed w/c<> toilet, tub/shower transfer utilizing tub transfer bench, w/c <> bed, and w/c <> low soft surface couch. Shower transfer completed with supervision, all other transfers mod I.  Completed floor transfer as part of fall recovery. Education provided regarding what to do in case of a fall including calling 911 if injury is suspected. Pt completed floor transfer with supervision following demonstration for technique.  Pt returned to room ambulating at end of session with CGA provided by pt's daughter.  DME delivered to pt's room and therapist adjusted to proper height. Pt left seated on EOB to eat lunch at end of session, all needs in reach.   Therapy Documentation Precautions:  Precautions Precautions: Fall Precaution Comments: hx of dizziness episodes Required Braces or Orthoses: Other Brace/Splint(Foot-up brace when ambulating) Restrictions Weight Bearing Restrictions: No Pain:   No/denies pain  See Function Navigator for Current Functional Status.   Therapy/Group: Individual Therapy  Parley Pidcock  L 08/29/2017, 7:16 AM

## 2017-08-29 NOTE — Discharge Instructions (Signed)
Inpatient Rehab Discharge Instructions  Harlyn Italiano Discharge date and time:  08/29/17  Activities/Precautions/ Functional Status: Activity: no lifting, driving, or strenuous exercise till cleared by MD.  Diet: cardiac diet Wound Care: keep wound clean and dry   Functional status:  ___ No restrictions     ___ Walk up steps independently _X__ 24/7 supervision/assistance  ___ Walk up steps with assistance ___ Intermittent supervision/assistance ___ Bathe/dress independently ___ Walk with walker    _X__ Bathe/dress with supervision  ___ Walk Independently    ___ Shower independently ___ Walk with assistance   ___ Shower with assistance _X__ No alcohol     ___ Return to work/school ________   COMMUNITY REFERRALS UPON DISCHARGE:   Home Health:   Since you did not have insurance coverage for home health therapies, follow the home exercise program the therapists gave you.  If something changes with your coverage, one of your doctors can order therapy for you at that time.  Medical Equipment/Items Ordered:  16"x16" lightweight wheelchair with basic cushion rented from [employer].  Rolling walker and tub bench were borrowed.  Agency/Supplier:  [employer]       Phone:  [phone] Other:  [employer] for assistance with your bill [phone]  GENERAL COMMUNITY RESOURCES FOR PATIENT/FAMILY: Support Groups:  [facility] Stroke Support Group                            Meets the second Thursday of each month from 3-4PM (excpet June, July, and August)                            In the dayroom of [facility] at [facility], [address]                            For more information, call [phone]   Special Instructions: 1. Walk only when someone is with you ( to prevent falls) and use wheelchair to get around during the day.    STROKE/TIA DISCHARGE INSTRUCTIONS SMOKING Cigarette smoking nearly doubles your  risk of having a stroke & is the single most alterable risk factor  If you smoke or have smoked in the last 12 months, you are advised to quit smoking for your health.  Most of the excess cardiovascular risk related to smoking disappears within a year of stopping.  Ask you doctor about anti-smoking medications  [place] Quit Line: 1-800-QUIT NOW  Free Smoking Cessation Classes [phone]  CHOLESTEROL Know your levels; limit fat & cholesterol in your diet  Lipid Panel     Component Value Date/Time   CHOL 174 08/16/2017 0625   TRIG 94 08/16/2017 0625   HDL 40 (L) 08/16/2017 0625   CHOLHDL 4.4 08/16/2017 0625   VLDL 19 08/16/2017 0625   LDLCALC 115 (H) 08/16/2017 0625      Many patients benefit from treatment even if their cholesterol is at goal.  Goal: Total Cholesterol (CHOL) less than 160  Goal:  Triglycerides (TRIG) less than 150  Goal:  HDL greater than 40  Goal:  LDL (LDLCALC) less than 100   BLOOD PRESSURE American Stroke Association blood pressure target is less that 120/80 mm/Hg  Your discharge blood pressure is:  BP: 126/88  Monitor your blood pressure  Limit your salt and alcohol  intake  Many individuals will require more than one medication for high blood pressure  DIABETES (A1c is a blood sugar average for last 3 months) Goal HGBA1c is under 7% (HBGA1c is blood sugar average for last 3 months)  Diabetes: No known diagnosis of diabetes    Lab Results  Component Value Date   HGBA1C 5.6 06/29/2017     Your HGBA1c can be lowered with medications, healthy diet, and exercise.  Check your blood sugar as directed by your physician  Call your physician if you experience unexplained or low blood sugars.  PHYSICAL ACTIVITY/REHABILITATION Goal is 30 minutes at least 4 days per week  Activity: No driving, Therapies: see above Return to work: N/A  Activity decreases your risk of heart attack and stroke and makes your heart stronger.  It helps control your weight and  blood pressure; helps you relax and can improve your mood.  Participate in a regular exercise program.  Talk with your doctor about the best form of exercise for you (dancing, walking, swimming, cycling).  DIET/WEIGHT Goal is to maintain a healthy weight  Your discharge diet is:  Diet Order           Diet Heart Room service appropriate? Yes; Fluid consistency: Thin  Diet effective now        liquids Your height is:  Height: 5\' 7"  (170.2 cm) Your current weight is: Weight: 54 kg (119 lb 0.8 oz) Your Body Mass Index (BMI) is:  BMI (Calculated): 18.64  Following the type of diet specifically designed for you will help prevent another stroke.  You are at goal weight.   Your goal Body Mass Index (BMI) is 19-24.  Healthy food habits can help reduce 3 risk factors for stroke:  High cholesterol, hypertension, and excess weight.  RESOURCES Stroke/Support Group:  Call [phone]   STROKE EDUCATION PROVIDED/REVIEWED AND GIVEN TO PATIENT Stroke warning signs and symptoms How to activate emergency medical system (call 911). Medications prescribed at discharge. Need for follow-up after discharge. Personal risk factors for stroke. Pneumonia vaccine given:  Flu vaccine given:  My questions have been answered, the writing is legible, and I understand these instructions.  I will adhere to these goals & educational materials that have been provided to me after my discharge from the hospital.     My questions have been answered and I understand these instructions. I will adhere to these goals and the provided educational materials after my discharge from the hospital.  Patient/Caregiver Signature _______________________________ Date __________  Clinician Signature _______________________________________ Date __________  Please bring this form and your medication list with you to all your follow-up doctor's appointments. Information on my medicine - Coumadin   (Warfarin)  This medication  education was reviewed with me or my healthcare representative as part of my discharge preparation.  Why was Coumadin prescribed for you? Coumadin was prescribed for you because you have a blood clot or a medical condition that can cause an increased risk of forming blood clots. Blood clots can cause serious health problems by blocking the flow of blood to the heart, lung, or brain. Coumadin can prevent harmful blood clots from forming. As a reminder your indication for Coumadin is: Soft plaque (clot) in ascending arch and descending aorta    What test will check on my response to Coumadin? While on Coumadin (warfarin) you will need to have an INR test regularly to ensure that your dose is keeping you in the desired range. The INR (international normalized ratio)  number is calculated from the result of the laboratory test called prothrombin time (PT).  If an INR APPOINTMENT HAS NOT ALREADY BEEN MADE FOR YOU please schedule an appointment to have this lab work done by your health care provider within 7 days. Your INR goal is usually a number between:  2 to 3 or your provider may give you a more narrow range like 2-2.5.  Ask your health care provider during an office visit what your goal INR is.  What  do you need to  know  About  COUMADIN? Take Coumadin (warfarin) exactly as prescribed by your healthcare provider about the same time each day.  DO NOT stop taking without talking to the doctor who prescribed the medication.  Stopping without other blood clot prevention medication to take the place of Coumadin may increase your risk of developing a new clot or stroke.  Get refills before you run out.  What do you do if you miss a dose? If you miss a dose, take it as soon as you remember on the same day then continue your regularly scheduled regimen the next day.  Do not take two doses of Coumadin at the same time.  Important Safety Information A possible side effect of Coumadin (Warfarin) is an  increased risk of bleeding. You should call your healthcare provider right away if you experience any of the following: ? Bleeding from an injury or your nose that does not stop. ? Unusual colored urine (red or dark brown) or unusual colored stools (red or black). ? Unusual bruising for unknown reasons. ? A serious fall or if you hit your head (even if there is no bleeding).  Some foods or medicines interact with Coumadin (warfarin) and might alter your response to warfarin. To help avoid this: ? Eat a balanced diet, maintaining a consistent amount of Vitamin K. ? Notify your provider about major diet changes you plan to make. ? Avoid alcohol or limit your intake to 1 drink for women and 2 drinks for men per day. (1 drink is 5 oz. wine, 12 oz. beer, or 1.5 oz. liquor.)  Make sure that ANY health care provider who prescribes medication for you knows that you are taking Coumadin (warfarin).  Also make sure the healthcare provider who is monitoring your Coumadin knows when you have started a new medication including herbals and non-prescription products.  Coumadin (Warfarin)  Major Drug Interactions  Increased Warfarin Effect Decreased Warfarin Effect  Alcohol (large quantities) Antibiotics (esp. Septra/Bactrim, Flagyl, Cipro) Amiodarone (Cordarone) Aspirin (ASA) Cimetidine (Tagamet) Megestrol (Megace) NSAIDs (ibuprofen, naproxen, etc.) Piroxicam (Feldene) Propafenone (Rythmol SR) Propranolol (Inderal) Isoniazid (INH) Posaconazole (Noxafil) Barbiturates (Phenobarbital) Carbamazepine (Tegretol) Chlordiazepoxide (Librium) Cholestyramine (Questran) Griseofulvin Oral Contraceptives Rifampin Sucralfate (Carafate) Vitamin K   Coumadin (Warfarin) Major Herbal Interactions  Increased Warfarin Effect Decreased Warfarin Effect  Garlic Ginseng Ginkgo biloba Coenzyme Q10 Green tea St. Johns wort    Coumadin (Warfarin) FOOD Interactions  Eat a consistent number of servings per week  of foods HIGH in Vitamin K (1 serving =  cup)  Collards (cooked, or boiled & drained) Kale (cooked, or boiled & drained) Mustard greens (cooked, or boiled & drained) Parsley *serving size only =  cup Spinach (cooked, or boiled & drained) Swiss chard (cooked, or boiled & drained) Turnip greens (cooked, or boiled & drained)  Eat a consistent number of servings per week of foods MEDIUM-HIGH in Vitamin K (1 serving = 1 cup)  Asparagus (cooked, or boiled & drained) Broccoli (cooked,  boiled & drained, or raw & chopped) Brussel sprouts (cooked, or boiled & drained) *serving size only =  cup Lettuce, raw (green leaf, endive, romaine) Spinach, raw Turnip greens, raw & chopped   These websites have more information on Coumadin (warfarin):  FailFactory.se; VeganReport.com.au;

## 2017-08-29 NOTE — Progress Notes (Signed)
ANTICOAGULATION CONSULT NOTE - Follow Up Consult  Pharmacy Consult for Coumadin Indication: Aortic embolus>CVA  No Known Allergies  Patient Measurements: Height: 5\' 7"  (170.2 cm) Weight: 119 lb 0.8 oz (54 kg) IBW/kg (Calculated) : 66.1  Vital Signs: Temp: 98 F (36.7 C) (06/05 0433) Temp Source: Oral (06/05 0433) BP: 126/88 (06/05 0433) Pulse Rate: 83 (06/05 0433)  Labs: Recent Labs    08/26/17 1053 08/27/17 0703 08/28/17 0458 08/29/17 0701  HGB 12.0* 12.8*  --   --   HCT 38.5* 39.7  --   --   PLT 356 373  --   --   LABPROT 24.8* 22.5* 25.3* 25.3*  INR 2.26 2.00 2.33 2.33  CREATININE  --   --  1.20  --     Estimated Creatinine Clearance: 48.1 mL/min (by C-G formula based on SCr of 1.2 mg/dL).  Assessment: 61 YOM completed Lovenox to warfarin bridge for soft clot in aorta thought to be source of embolism. Hgb improved 12.8 and plts stable on 6/3.   INR remains in goal range at 2.33 today.  Goal of Therapy:  INR 2-3 Monitor platelets by anticoagulation protocol: Yes   Plan:  Recommend patient discharges on warfarin 5mg  daily with and INR check on Friday 6/7 or Monday 6/10 at the latest Daily INR while here (warfarin dose entered for tonight in case he does not discharge today)  Luvenia Cranford D. Ernestine Langworthy, PharmD, BCPS Clinical Pharmacist [phone] Please check AMION for all [place] numbers 08/29/2017 9:01 AM

## 2017-08-29 NOTE — Progress Notes (Signed)
Physical Therapy Session Note  Patient Details  Name: Thomas [name] MRN: [id number] Date of Birth: 06/03/1955  Today's Date: 08/29/2017 PT Individual Time: 1113-1130 PT Individual Time Calculation (min): 17 min   Short Term Goals: Week 1:  PT Short Term Goal 1 (Week 1): Pt will ascend/descend 4 steps with AD and no handrails, min assist PT Short Term Goal 2 (Week 1): Pt will maintain dynamic standing balance without UE support, min assist PT Short Term Goal 3 (Week 1): Pt will perform bed<>w/c transfers with supervision   Skilled Therapeutic Interventions/Progress Updates:  Pt received in bed with daughter Jeanett Schlein) present for caregiver training. Pt denied c/o pain but reported ongoing fatigue is the same as it has been. Educated pt & daughter on need for pt to complete transfers only and use w/c when home without assistance, pt is only able to ambulate with assistance, R foot-up brace & RW and to limit ambulating over uneven surfaces. Pt donned R footup brace and shoes & transferred to w/c via mod I - pt able to set up w/c and manage w/c parts without assistance. Pt propels w/c room>gym with BUE & mod I. Educated daughter on proper positioning with assisting pt with stair negotiation and ambulation & she was able to return demonstrate; daughter also elected to use gait belt for increased safety. Pt & daughter negotiated 4 steps with B rails and ambulated ~150 ft with RW with good positioning. Educated them on pt's need for frequent rest breaks 2/2 fatigue as well as need to clear floors and home, as well as be mindful when negotiating thresholds, to decreased pt's fall risk. Pt & daughter do not voice any questions or concerns. Pt & daughter left in gym awaiting OT arrival.  Therapy Documentation Precautions:  Precautions Precautions: Fall Precaution Comments: hx of dizziness episodes Required Braces or Orthoses: Other Brace/Splint(Foot-up brace when ambulating) Restrictions Weight Bearing  Restrictions: No   See Function Navigator for Current Functional Status.   Therapy/Group: Individual Therapy  [name] 08/29/2017, 12:13 PM

## 2017-08-29 NOTE — Progress Notes (Signed)
Patient scheduled for discharge today. Patient is alert and oriented. Calm and controlled. Discharge instructions reviewed by P. Love, PA. Patient discharged to home with discharge instructions and personal belongings accompanied by Daughter and Niece.

## 2017-08-31 ENCOUNTER — Telehealth: Payer: Self-pay | Admitting: Registered Nurse

## 2017-08-31 NOTE — Telephone Encounter (Signed)
Transitional care call placed, no answer. Left message to return the call.

## 2017-09-03 ENCOUNTER — Ambulatory Visit: Payer: Private Health Insurance - Indemnity | Attending: Family Medicine | Admitting: Physician Assistant

## 2017-09-03 VITALS — BP 114/73 | HR 80 | Temp 98.0°F | Resp 16 | Ht 67.0 in | Wt 127.0 lb

## 2017-09-03 DIAGNOSIS — Z09 Encounter for follow-up examination after completed treatment for conditions other than malignant neoplasm: Secondary | ICD-10-CM | POA: Diagnosis present

## 2017-09-03 DIAGNOSIS — I739 Peripheral vascular disease, unspecified: Secondary | ICD-10-CM | POA: Diagnosis not present

## 2017-09-03 DIAGNOSIS — I63542 Cerebral infarction due to unspecified occlusion or stenosis of left cerebellar artery: Secondary | ICD-10-CM

## 2017-09-03 DIAGNOSIS — Z9889 Other specified postprocedural states: Secondary | ICD-10-CM | POA: Insufficient documentation

## 2017-09-03 DIAGNOSIS — D689 Coagulation defect, unspecified: Secondary | ICD-10-CM | POA: Diagnosis not present

## 2017-09-03 DIAGNOSIS — Z79899 Other long term (current) drug therapy: Secondary | ICD-10-CM | POA: Insufficient documentation

## 2017-09-03 DIAGNOSIS — I663 Occlusion and stenosis of cerebellar arteries: Secondary | ICD-10-CM | POA: Insufficient documentation

## 2017-09-03 DIAGNOSIS — N183 Chronic kidney disease, stage 3 (moderate): Secondary | ICD-10-CM | POA: Diagnosis not present

## 2017-09-03 DIAGNOSIS — I713 Abdominal aortic aneurysm, ruptured, unspecified: Secondary | ICD-10-CM

## 2017-09-03 DIAGNOSIS — Z7982 Long term (current) use of aspirin: Secondary | ICD-10-CM | POA: Insufficient documentation

## 2017-09-03 DIAGNOSIS — I129 Hypertensive chronic kidney disease with stage 1 through stage 4 chronic kidney disease, or unspecified chronic kidney disease: Secondary | ICD-10-CM | POA: Insufficient documentation

## 2017-09-03 DIAGNOSIS — N179 Acute kidney failure, unspecified: Secondary | ICD-10-CM

## 2017-09-03 DIAGNOSIS — Z87891 Personal history of nicotine dependence: Secondary | ICD-10-CM | POA: Diagnosis not present

## 2017-09-03 DIAGNOSIS — Z7901 Long term (current) use of anticoagulants: Secondary | ICD-10-CM | POA: Diagnosis not present

## 2017-09-03 DIAGNOSIS — Z0279 Encounter for issue of other medical certificate: Secondary | ICD-10-CM | POA: Diagnosis not present

## 2017-09-03 MED ORDER — PANTOPRAZOLE SODIUM 40 MG PO TBEC: 40.0000 mg | DELAYED_RELEASE_TABLET | Freq: Every day | ORAL | 3 refills | Status: DC

## 2017-09-03 MED ORDER — GABAPENTIN 100 MG PO CAPS: 100.0000 mg | ORAL_CAPSULE | Freq: Every day | ORAL | 3 refills | Status: DC

## 2017-09-03 MED ORDER — ATORVASTATIN CALCIUM 80 MG PO TABS: 80.0000 mg | ORAL_TABLET | Freq: Every day | ORAL | 3 refills | Status: DC

## 2017-09-03 MED ORDER — WARFARIN SODIUM 5 MG PO TABS: 5.0000 mg | ORAL_TABLET | Freq: Every day | ORAL | 3 refills | Status: DC

## 2017-09-03 NOTE — Patient Instructions (Signed)
INR today in the office was 1.9. Please take 1 and 1/2 tablet today only and then go back and take 1 tablet all other days. Come back in 1 week to see [facility]

## 2017-09-03 NOTE — Progress Notes (Signed)
Thomas [name]  BMW:[id number]  UVO:[id number]  DOB - 04-28-1955  Chief Complaint  Patient presents with  . Hospitalization Follow-up  . Medication Refill       Subjective:   Thomas [name] is a 62 y.o. male here today for establishment of care. He has a PMHx of smoking and CKD stage 3. He is [place] but speaks good [place]. On 06/28/17 he presented to the hospital with weakness, chills, dizziness, cough, left-sided chest pain, abdominal pain, nausea and vomiting.   CTA aortic artery showed ruptured AAA. S/p emergent endovascular repair of ruptured AAA, bilateral iliofemoral endarterectomy, and BLE thrumboembolectomies. Postop, patient had RLE numbness and mild right foot weakness, repeat CTA aorta showed right iliac artery in-stent thrombosis as well as left retroperitoneal hematoma.  Symptoms consistent with PVD, no further surgery performed at that time.  Patient numbness and weakness on the right LE gradually improved over time.  Due to dizziness, chills, nausea vomiting, patient had MRI with and without contrast showed 3 acute small left cerebellar infarcts, old small bilateral cerebellar infarcts as well as old BG and thalamus infarcts.  MRA head and neck revealed right M1 chronic occlusion, bilateral PCA stenosis in the terminal right VA stenosis, no mycotic aneurysm.  EF 60 to 65%.  LDL 72 and A1c 5.6.  Initially plan for TEE to rule out endocarditis, however, patient deemed high risk, then started empiric treatment of Rocephin for 6 weeks. Started on aspirin and Lipitor.  Saw Neuro in follow up.  Hospital course also complicated by AKI on CKD stage 3. He was empirically treated for possible PNA. Discharged 07/12/17. Home health never started due to cost/insurance.     Unfortunately on 08/15/17 he developed worsening weakness, malaise and inability to walk leading him back to the ED. CT head actually negative for an acute process.  A CT of the pelvis surgeon who feels like the changes are  inflammatory no need to take him back to the operating room.  He was admitted by the internal medicine team.  Neurology saw him for new strokes.  There was soft plaques in the thoracic abdominal aneurysm and he was bridged with Lovenox to Coumadin.  His INR at follow-up on 08/29/2017 was therapeutic at 2.3.  He takes 5 mg of Coumadin.  He is no longer on Lovenox.  He has been compliant with his medications.  He has stopped smoking.  Blood pressure checks at home have been satisfactory.  Has the assistance of his daughter and a cousin at home.  Walks with a walker most of the time.  He does have a wheelchair as well.  His speech is clear.  Weakness is improving.  No new complaints today.  He does need medication refills.   ROS: GEN: denies fever or chills, denies change in weight Skin: denies lesions or rashes HEENT: denies headache, earache, epistaxis, sore throat, or neck pain LUNGS: denies SHOB, dyspnea, PND, orthopnea CV: denies CP or palpitations ABD: denies abd pain, N or V EXT: denies muscle spasms or swelling; no pain in lower ext, no weakness NEURO: denies numbness or tingling, denies sz, stroke or TIA   ALLERGIES: No Known Allergies  PAST MEDICAL HISTORY: Past Medical History:  Diagnosis Date  . Hypertension   . Stroke [facility])     PAST SURGICAL HISTORY: Past Surgical History:  Procedure Laterality Date  . ENDOVASCULAR STENT INSERTION N/A 07/01/2017   Procedure: ENDOVASCULAR STENT GRAFT INSERTION;  Surgeon: Serafina Mitchell, MD;  Location: [facility];  Service: Vascular;  Laterality: N/A;  . THROMBECTOMY FEMORAL ARTERY Bilateral 07/01/2017   Procedure: Bilateral IlioFemoral Thrombectomies;  Surgeon: Serafina Mitchell, MD;  Location: [facility] OR;  Service: Vascular;  Laterality: Bilateral;    MEDICATIONS AT HOME: Prior to Admission medications   Medication Sig Start Date End Date Taking? Authorizing Provider  acetaminophen (TYLENOL) 500 MG tablet Take 500 mg by mouth every 6 (six) hours as  needed (for pain or headaches).   Yes [provider]  aspirin 81 MG EC tablet Take 1 tablet (81 mg total) by mouth daily. 08/29/17  Yes Love, Ivan Anchors, PA-C  atorvastatin (LIPITOR) 80 MG tablet Take 1 tablet (80 mg total) by mouth daily at 6 PM. 09/03/17  Yes Ena Dawley, Dalyn Becker S, PA-C  gabapentin (NEURONTIN) 100 MG capsule Take 1 capsule (100 mg total) by mouth at bedtime. 09/03/17  Yes Ena Dawley, Maximiano Lott S, PA-C  pantoprazole (PROTONIX) 40 MG tablet Take 1 tablet (40 mg total) by mouth daily. 09/03/17 09/03/18 Yes Ena Dawley, Owynn Mosqueda S, PA-C  warfarin (COUMADIN) 5 MG tablet Take 1 tablet (5 mg total) by mouth daily at 6 PM. 09/03/17  Yes Brayton Caves, PA-C    Fam-noncontributory  Social-unmarried, daughter and cousin live with him, reformed smoker  Objective:   Vitals:   09/03/17 0911  BP: 114/73  Pulse: 80  Resp: 16  Temp: 98 F (36.7 C)  TempSrc: Oral  SpO2: 97%  Weight: 127 lb (57.6 kg)  Height: 5\' 7"  (1.702 m)    Exam General appearance : Awake, alert, not in any distress. Speech Clear. Not toxic looking Neck: supple, no JVD. No cervical lymphadenopathy.  Chest:Good air entry bilaterally, no added sounds  CVS: S1 S2 regular, no murmurs.  Abdomen: Bowel sounds present, Non tender and not distended with no guarding, rigidity or rebound. Extremities: B/L Lower Ext shows no edema, both legs are warm to touch; dec ROM and dec strength Neurology: Awake alert, and oriented X 3, CN II-XII intact, Non focal Skin:No Rash Wounds:no drainage  Data Review Lab Results  Component Value Date   HGBA1C 5.6 06/29/2017     Assessment & Plan  1. AAA rupture s/p emergent endovascular repair with residual TAA dz  -RF modificaiton  -appt with vasc surg and CT surg per DC summary 2. Cerebellar Artery Embolism/CVA  -enroll in our Coumadin clinic, 1st check today  -Coumadin 5 mg for now or as directed by INR  -Risk factor modification  -cont ASA/Statin 3. AKI on CKD stage 3   -BMP  today  -avoid nephrotoxic agents 4. Gait Instabilty 2/2 # 2  -cont PT related exercises 5. PAD  -RF modificaiton  -vasc surgery  -ASA and Statin  Return in about 2 weeks (around 09/17/2017).  The patient was given clear instructions to go to ER or return to medical center if symptoms don't improve, worsen or new problems develop. The patient verbalized understanding. The patient was told to call to get lab results if they haven't heard anything in the next week.   Total time spent with patient was 47 min . Greater than 50 % of this visit was spent face to face counseling and coordinating care regarding risk factor modification, compliance importance and encouragement, education related to complex hospitalizations.  This note has been created with Surveyor, quantity. Any transcriptional errors are unintentional.    Zettie Pho, PA-C [facility] [place], [place]   09/03/2017, 9:52 AM

## 2017-09-03 NOTE — Progress Notes (Signed)
Patient is here for hospital follow-up.  Patient request medication refills.

## 2017-09-03 NOTE — Telephone Encounter (Signed)
Transitional Care call Transitional Care Call Questions Answered by Daughter Ms. Kaito Schulenburg   Patient name: Thomas [name] DOB: 1955-09-01 1. Are you/is patient experiencing any problems since coming home? No a. Are there any questions regarding any aspect of care? No 2. Are there any questions regarding medications administration/dosing? No a. Are meds being taken as prescribed? Yes b. "Patient should review meds with caller to confirm"  3. Have there been any falls? No 4. Has Home Health been to the house and/or have they contacted you? Not covered by insurance a. If not, have you tried to contact them? NA b. Can we help you contact them? NA 5. Are bowels and bladder emptying properly? Yes a. Are there any unexpected incontinence issues? No b. If applicable, is patient following bowel/bladder programs? NA 6. Any fevers, problems with breathing, unexpected pain? No 7. Are there any skin problems or new areas of breakdown? No 8. Has the patient/family member arranged specialty MD follow up (ie cardiology/neurology/renal/surgical/etc.)?  Yes a. Can we help arrange? NA 9. Does the patient need any other services or support that we can help arrange? NA 10. Are caregivers following through as expected in assisting the patient? Yes 11. Has the patient quit smoking, drinking alcohol, or using drugs as recommended? Daughter states Mr. Kohlbeck doesn't smoke, drink alcohol or use illicit drugs.   Appointment date/time 09/11/2017, arrival time 1:15 for 1:45 pm appointment with Dr. Letta Pate. At [facility]

## 2017-09-04 LAB — BASIC METABOLIC PANEL
BUN / CREAT RATIO: 18 (ref 10–24)
BUN: 25 mg/dL (ref 8–27)
CO2: 23 mmol/L (ref 20–29)
CREATININE: 1.4 mg/dL — AB (ref 0.76–1.27)
Calcium: 9.2 mg/dL (ref 8.6–10.2)
Chloride: 101 mmol/L (ref 96–106)
GFR calc Af Amer: 61 mL/min/{1.73_m2} (ref >59)
GFR calc non Af Amer: 53 mL/min/{1.73_m2} — ABNORMAL LOW (ref >59)
GLUCOSE: 108 mg/dL — AB (ref 65–99)
Potassium: 4.4 mmol/L (ref 3.5–5.2)
SODIUM: 136 mmol/L (ref 134–144)

## 2017-09-11 ENCOUNTER — Encounter: Payer: Self-pay | Admitting: Physical Medicine & Rehabilitation

## 2017-09-11 ENCOUNTER — Encounter: Payer: Private Health Insurance - Indemnity | Attending: Physical Medicine & Rehabilitation

## 2017-09-11 ENCOUNTER — Ambulatory Visit (HOSPITAL_BASED_OUTPATIENT_CLINIC_OR_DEPARTMENT_OTHER): Payer: Private Health Insurance - Indemnity | Admitting: Physical Medicine & Rehabilitation

## 2017-09-11 VITALS — BP 160/90 | HR 74 | Resp 14 | Ht 67.0 in | Wt 127.0 lb

## 2017-09-11 DIAGNOSIS — Z79899 Other long term (current) drug therapy: Secondary | ICD-10-CM | POA: Insufficient documentation

## 2017-09-11 DIAGNOSIS — R2681 Unsteadiness on feet: Secondary | ICD-10-CM | POA: Insufficient documentation

## 2017-09-11 DIAGNOSIS — J438 Other emphysema: Secondary | ICD-10-CM | POA: Insufficient documentation

## 2017-09-11 DIAGNOSIS — I719 Aortic aneurysm of unspecified site, without rupture: Secondary | ICD-10-CM | POA: Diagnosis not present

## 2017-09-11 DIAGNOSIS — N189 Chronic kidney disease, unspecified: Secondary | ICD-10-CM | POA: Insufficient documentation

## 2017-09-11 DIAGNOSIS — Z7901 Long term (current) use of anticoagulants: Secondary | ICD-10-CM | POA: Diagnosis not present

## 2017-09-11 DIAGNOSIS — Z87891 Personal history of nicotine dependence: Secondary | ICD-10-CM | POA: Insufficient documentation

## 2017-09-11 DIAGNOSIS — Z8673 Personal history of transient ischemic attack (TIA), and cerebral infarction without residual deficits: Secondary | ICD-10-CM | POA: Insufficient documentation

## 2017-09-11 DIAGNOSIS — I663 Occlusion and stenosis of cerebellar arteries: Secondary | ICD-10-CM | POA: Insufficient documentation

## 2017-09-11 DIAGNOSIS — Z7982 Long term (current) use of aspirin: Secondary | ICD-10-CM | POA: Insufficient documentation

## 2017-09-11 DIAGNOSIS — R531 Weakness: Secondary | ICD-10-CM | POA: Insufficient documentation

## 2017-09-11 DIAGNOSIS — J432 Centrilobular emphysema: Secondary | ICD-10-CM | POA: Insufficient documentation

## 2017-09-11 DIAGNOSIS — I129 Hypertensive chronic kidney disease with stage 1 through stage 4 chronic kidney disease, or unspecified chronic kidney disease: Secondary | ICD-10-CM | POA: Insufficient documentation

## 2017-09-11 NOTE — Progress Notes (Signed)
Subjective:    Patient ID: Thomas [name], male    DOB: 12/10/1955, 62 y.o.   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  He was discharged to home and had completed his IV antibiotic course however was readmitted 08/15/2017 with 3-day history of bilateral lower extremity weakness as well as abdominal and groin pain.  CT abdomen pelvis showed no evidence of leak or infection.  MRI of brain showed acute subcentimeter infarcts bilateral cerebellar and right cervical medullary junction as well as slow flow  V/S occluded R-VA.   CTA chest done revealing 4.5 cm thoracic aortic aneurysm but soft plaque and minimal ulcerative plaque and bilateral centrilobular lobe bulla/paraseptal emphysema.  Dr. Erlinda Hong felt that sroke was embolic due to extensive soft plaque  and recommended Lovenox bridge to Coumadin as well as low-dose aspirin. Patient continued to have weakness with functional deficits and CIR was recommended for follow up therapy.   Admit date: 08/18/2017 Discharge date: 08/29/2017  HPI Pt has exercises from PT, OT, [employer], patient does not have insurance coverage for outpatient therapy.  Mod I with self-care and mobility.  Lives with daughter who helps him with complex ADLs but patient can do simple ADLs independently. He still uses a walker due to gait imbalance.  No falls reported  Has appt with Dr Trula Slade, for f/u of iliiofemoral bypass  Pain Inventory Average Pain 3 Pain Right Now 3 My pain is aching  In the last 24 hours, has pain interfered with the following? General activity 1 Relation with others 0 Enjoyment of life 2 What TIME of day is your pain at  its worst? night Sleep (in general) Good  Pain is worse with: other Pain improves with: rest and medication Relief from Meds: 3  Mobility walk with assistance use a walker ability to climb steps?  yes do you drive?  yes use a wheelchair transfers alone  Function employed # of hrs/week . disabled: date disabled 06/28/2017 I need assistance with the following:  bathing, meal prep, household duties and shopping  Neuro/Psych weakness numbness tingling trouble walking dizziness  Prior Studies Any changes since last visit?  no  Physicians involved in your care Any changes since last visit?  no   History reviewed. No pertinent family history. Social History   Socioeconomic History  . Marital status: Divorced    Spouse name: Not on file  . Number of children: Not on file  . Years of education: Not on file  . Highest education level: Not on file  Occupational History  . Not on file  Social Needs  . Financial resource strain: Not on file  . Food insecurity:    Worry: Not on file    Inability: Not on file  . Transportation needs:    Medical: Not on file    Non-medical: Not on file  Tobacco Use  . Smoking status: Former Smoker    Types: Cigarettes    Last attempt to quit: 07/16/2017    Years since quitting: 0.1  . Smokeless tobacco: Never Used  Substance and Sexual Activity  . Alcohol use: No  . Drug use: No  . Sexual activity: Not on file  Lifestyle  . Physical activity:    Days per week: Not  on file    Minutes per session: Not on file  . Stress: Not on file  Relationships  . Social connections:    Talks on phone: Not on file    Gets together: Not on file    Attends religious service: Not on file    Active member of club or organization: Not on file    Attends meetings of clubs or organizations: Not on file    Relationship status: Not on file  Other Topics Concern  . Not on file  Social History Narrative  . Not on file   Past Surgical History:    Procedure Laterality Date  . ENDOVASCULAR STENT INSERTION N/A 07/01/2017   Procedure: ENDOVASCULAR STENT GRAFT INSERTION;  Surgeon: Serafina Mitchell, MD;  Location[facility] OR;  Service: Vascular;  Laterality: N/A;  . THROMBECTOMY FEMORAL ARTERY Bilateral 07/01/2017   Procedure: Bilateral IlioFemoral Thrombectomies;  Surgeon: Serafina Mitchell, MD;  Location: [facility] OR;  Service: Vascular;  Laterality: Bilateral;   Past Medical History:  Diagnosis Date  . Hypertension   . Stroke ([place])    BP (!) 160/90 (BP Location: Right Arm, Patient Position: Sitting, Cuff Size: Normal)   Pulse 74   Resp 14   Ht 5\' 7"  (1.702 m)   Wt 127 lb (57.6 kg)   SpO2 96%   BMI 19.89 kg/m   Opioid Risk Score:   Fall Risk Score:  `1  Depression screen PHQ 2/9  Depression screen PHQ 2/9 09/03/2017  Decreased Interest 0  Down, Depressed, Hopeless 0  PHQ - 2 Score 0  Altered sleeping 1  Tired, decreased energy 3  Change in appetite 0  Feeling bad or failure about yourself  0  Trouble concentrating 0  Moving slowly or fidgety/restless 0  Suicidal thoughts 0  PHQ-9 Score 4    Review of Systems  Constitutional: Negative.   HENT: Negative.   Eyes: Negative.   Respiratory: Negative.   Cardiovascular: Negative.   Gastrointestinal: Negative.   Endocrine: Negative.   Genitourinary: Negative.   Musculoskeletal: Positive for gait problem.  Skin: Negative.   Allergic/Immunologic: Negative.   Neurological: Positive for dizziness, weakness and numbness.       Tingling   Hematological: Negative.   All other systems reviewed and are negative.      Objective:   Physical Exam  Constitutional: He is oriented to person, place, and time. He appears well-developed and well-nourished. No distress.  HENT:  Head: Normocephalic and atraumatic.  Eyes: Pupils are equal, round, and reactive to light. EOM are normal.  Cardiovascular: Normal rate, regular rhythm and normal heart sounds.  No murmur heard. Pulmonary/Chest:  Effort normal and breath sounds normal. No stridor. No respiratory distress. He has no rales.  Abdominal: Soft. Bowel sounds are normal. He exhibits no distension. There is no tenderness.  Neurological: He is alert and oriented to person, place, and time. He displays no tremor. He exhibits normal muscle tone. Gait abnormal.  Motor strength is 5/5 bilateral deltoid, bicep, tricep, grip, hip flexion, knee extensor, ankle dorsiflexor There is no evidence of dysmetria on finger-nose-finger testing or heel shin testing bilaterally Romberg is positive he tends to fall toward the right side. He ambulates with a walker no evidence of toe drag or knee instability. He requires standby or contact guard assist to ambulate without assistive device.  Skin: He is not diaphoretic.  Nursing note and vitals reviewed.         Assessment & Plan:   #1.  Bilateral cerebellar and right cervical medullary junction infarcts with excellent improvement in his functional status.  He still has some truncal ataxia leaning toward the right side.  He will need a walker for this.  We discussed that over time i.e. several months he may regain the ability to ambulate with a cane or even without assistive device.  He needs to continue his home exercise program.  He does not have insurance benefits from outpatient therapy but really does not need this from a OT or speech standpoint. I do not think he is safe to drive at the current time I do not think he is safe to go back to work at the current time Patient has follow-up appointments with neurology as well as vascular surgery.  He has appointment with thoracic surgery to evaluate thoracic aortic aneurysm. Patient [facility]

## 2017-09-11 NOTE — Patient Instructions (Addendum)
No working  No driving  Call [facility] with questions regarding wheelchair

## 2017-09-12 ENCOUNTER — Ambulatory Visit: Payer: Medicaid Other | Attending: Family Medicine | Admitting: Pharmacist

## 2017-09-12 DIAGNOSIS — Z7901 Long term (current) use of anticoagulants: Secondary | ICD-10-CM | POA: Insufficient documentation

## 2017-09-12 DIAGNOSIS — Z7982 Long term (current) use of aspirin: Secondary | ICD-10-CM | POA: Diagnosis not present

## 2017-09-12 DIAGNOSIS — I63342 Cerebral infarction due to thrombosis of left cerebellar artery: Secondary | ICD-10-CM | POA: Insufficient documentation

## 2017-09-12 DIAGNOSIS — I63443 Cerebral infarction due to embolism of bilateral cerebellar arteries: Secondary | ICD-10-CM | POA: Insufficient documentation

## 2017-09-12 DIAGNOSIS — I663 Occlusion and stenosis of cerebellar arteries: Secondary | ICD-10-CM | POA: Diagnosis present

## 2017-09-12 LAB — POCT INR: INR: 2.5 (ref 2.0–3.0)

## 2017-09-22 ENCOUNTER — Other Ambulatory Visit: Payer: Self-pay | Admitting: Physical Medicine and Rehabilitation

## 2017-09-25 ENCOUNTER — Ambulatory Visit
Admission: RE | Admit: 2017-09-25 | Discharge: 2017-09-25 | Disposition: A | Discharge status: Home / Self Care | Payer: Private Health Insurance - Indemnity | Source: Ambulatory Visit | Attending: Surgery | Admitting: Surgery

## 2017-09-25 DIAGNOSIS — Z9889 Other specified postprocedural states: Principal | ICD-10-CM

## 2017-09-25 DIAGNOSIS — Z8679 Personal history of other diseases of the circulatory system: Secondary | ICD-10-CM

## 2017-09-25 MED ORDER — IOPAMIDOL (ISOVUE-370) INJECTION 76%
75.0000 mL | Freq: Once | INTRAVENOUS | Status: AC | PRN
  Administered 2017-09-25: 75 mL via INTRAVENOUS

## 2017-09-26 ENCOUNTER — Encounter

## 2017-09-26 ENCOUNTER — Ambulatory Visit: Payer: Private Health Insurance - Indemnity | Attending: Nurse Practitioner | Admitting: Nurse Practitioner

## 2017-09-26 ENCOUNTER — Encounter: Payer: Self-pay | Admitting: Nurse Practitioner

## 2017-09-26 VITALS — BP 135/84 | HR 90 | Temp 98.7°F | Ht 67.0 in | Wt 128.6 lb

## 2017-09-26 DIAGNOSIS — K219 Gastro-esophageal reflux disease without esophagitis: Secondary | ICD-10-CM | POA: Insufficient documentation

## 2017-09-26 DIAGNOSIS — Z7901 Long term (current) use of anticoagulants: Secondary | ICD-10-CM | POA: Diagnosis not present

## 2017-09-26 DIAGNOSIS — I63443 Cerebral infarction due to embolism of bilateral cerebellar arteries: Secondary | ICD-10-CM

## 2017-09-26 DIAGNOSIS — Z7982 Long term (current) use of aspirin: Secondary | ICD-10-CM | POA: Insufficient documentation

## 2017-09-26 DIAGNOSIS — N183 Chronic kidney disease, stage 3 (moderate): Secondary | ICD-10-CM | POA: Diagnosis not present

## 2017-09-26 DIAGNOSIS — A403 Sepsis due to Streptococcus pneumoniae: Secondary | ICD-10-CM | POA: Diagnosis not present

## 2017-09-26 DIAGNOSIS — Z79899 Other long term (current) drug therapy: Secondary | ICD-10-CM | POA: Diagnosis not present

## 2017-09-26 DIAGNOSIS — I749 Embolism and thrombosis of unspecified artery: Secondary | ICD-10-CM | POA: Diagnosis present

## 2017-09-26 DIAGNOSIS — I663 Occlusion and stenosis of cerebellar arteries: Secondary | ICD-10-CM | POA: Diagnosis not present

## 2017-09-26 DIAGNOSIS — I129 Hypertensive chronic kidney disease with stage 1 through stage 4 chronic kidney disease, or unspecified chronic kidney disease: Secondary | ICD-10-CM | POA: Insufficient documentation

## 2017-09-26 DIAGNOSIS — Z8673 Personal history of transient ischemic attack (TIA), and cerebral infarction without residual deficits: Secondary | ICD-10-CM | POA: Insufficient documentation

## 2017-09-26 LAB — POCT INR: INR: 2.6 (ref 2.0–3.0)

## 2017-09-26 MED ORDER — PANTOPRAZOLE SODIUM 40 MG PO TBEC: 40.0000 mg | DELAYED_RELEASE_TABLET | Freq: Every day | ORAL | 3 refills | Status: DC

## 2017-09-26 MED ORDER — WARFARIN SODIUM 5 MG PO TABS: 5.0000 mg | ORAL_TABLET | Freq: Every day | ORAL | 3 refills | Status: DC

## 2017-09-26 NOTE — Patient Instructions (Signed)
Hospital Discharge After a Stroke  °Being discharged from the hospital after a stroke can feel overwhelming. Many things may be different, and it is normal to feel scared or anxious. Some stroke survivors may be able to return to their homes, and others may need more specialized care on a temporary or permanent basis. °Your stroke care team will work with you to develop a discharge plan that is best for you. Ask questions if you do not understand something. Invite a friend or family member to participate in discharge planning. Understanding and following your discharge plan can help to prevent another stroke or other problems. °Understanding your medicines °After a stroke, your health care provider may prescribe one or more types of medicine. It is important to take medicines exactly as told by your health care provider. Serious harm, such as another stroke, can happen if you are unable to take your medicine exactly as prescribed. Make sure you understand: °· What medicine to take. °· Why you are taking the medicine. °· How and when to take it. °· If it can be taken with your other medicines and herbal supplements. °· Possible side effects. °· When to call your health care provider if you have any side effects. °· How you will get and pay for your medicines. Medical assistance programs may be able to help you pay for prescription medicines if you cannot afford them. ° °If you are taking an anticoagulant, be sure to take it exactly as told by your health care provider. This type of medicine can increase the risk of bleeding because it works to prevent blood from clotting. You may need to take certain precautions to prevent bleeding. You should contact your health care provider if you have: °· Bleeding or bruising. °· A fall or other injury to your head. °· Blood in your urine or stool (feces). ° °Planning for home safety °Take steps to prevent falls, such as installing grab bars or using a shower chair. Ask a friend  or family member to get needed things in place before you go home if possible. A therapist can come to your home to make recommendations for safety equipment. Ask your health care provider if you would benefit from this service or from home care. °Getting needed equipment °Ask your health care provider for a list of any medical equipment and supplies you will need at home. These may include items such as: °· Walkers. °· Canes. °· Wheelchairs. °· Hand-strengthening devices. °· Special eating utensils. ° °Medical equipment can be rented or purchased, depending on your insurance coverage. Check with your insurance company about what is covered. °Keeping follow-up visits °After a stroke, you will need to follow up regularly with a health care provider. You may also need rehabilitation, which can include physical therapy, occupational therapy, or speech-language therapy. °Keeping these appointments is very important to your recovery after a stroke. Be sure to bring your medicine list and discharge papers with you to your appointments. If you need help to keep track of your schedule, use a calendar or appointment reminder. °Preventing another stroke °Having a stroke puts you at risk for another stroke in the future. Ask your health care provider what actions you can take to lower the risk. These may include: °· Increasing how much you exercise. °· Making a healthy eating plan. °· Quitting smoking. °· Managing other health conditions, such as high blood pressure, high cholesterol, or diabetes. °· Limiting alcohol use. ° °Knowing the warning signs of a stroke °  Make sure you understand the signs of a stroke. Before you leave the hospital, you will receive information outlining the stroke warning signs. Share these with your friends and family members. °"BE FAST" is an easy way to remember the main warning signs of a stroke: °· B - Balance. Signs are dizziness, sudden trouble walking, or loss of balance. °· E - Eyes. Signs  are trouble seeing or a sudden change in vision. °· F - Face. Signs are sudden weakness or numbness of the face, or the face or eyelid drooping on one side. °· A - Arms. Signs are weakness or numbness in an arm. This happens suddenly and usually on one side of the body. °· S - Speech. Signs are sudden trouble speaking, slurred speech, or trouble understanding what people say. °· T - Time. Time to call emergency services. Write down what time symptoms started. ° °Other signs of stroke may include: °· A sudden, severe headache with no known cause. °· Nausea or vomiting. °· Seizure. ° °These symptoms may represent a serious problem that is an emergency. Do not wait to see if the symptoms will go away. Get medical help right away. Call your local emergency services (911 in the [place] Do not drive yourself to the hospital. °Make note of the time that you had your first symptoms. Your emergency responders or emergency room staff will need to know this information. °Summary °· Being discharged from the hospital after a stroke can feel overwhelming. It is normal to feel scared or anxious. °· Make sure you take medicines exactly as told by your health care provider. °· Know the warning signs of a stroke, and get help right way if you have any of these symptoms. "BE FAST" is an easy way to remember the main warning signs of a stroke. °This information is not intended to replace advice given to you by your health care provider. Make sure you discuss any questions you have with your health care provider. °Document Released: 06/16/2016 Document Revised: 06/16/2016 Document Reviewed: 06/16/2016 °Elsevier Interactive Patient Education © 2018 [employer] ° °

## 2017-09-26 NOTE — Progress Notes (Signed)
Assessment & Plan:  Thomas [name] was seen today for establish care.  Diagnoses and all orders for this visit:  Cerebellar artery embolism -     INR Continue coumadin 5mg  daily as prescribed.   Infarction of brain due to bilateral embolism of cerebellar arteries (HCC) -     INR  Sepsis due to Streptococcus pneumoniae (HCC) RESOLVED  Gastroesophageal reflux disease, esophagitis presence not specified -     pantoprazole (PROTONIX) 40 MG tablet; Take 1 tablet (40 mg total) by mouth daily.    Patient has been counseled on age-appropriate routine health concerns for screening and prevention. These are reviewed and up-to-date. Referrals have been placed accordingly. Immunizations are up-to-date or declined.    Subjective:   Chief Complaint  Patient presents with  . Establish Care    Pt. is here to establish care. Pt. stated he have abdominal pain sometimes.    HPI Thomas [name] 62 y.o. male presents to office today to establish care. His daughter is accompanying him today. 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 He was discharged from [facility] on 08-29-2017.  Currently taking Coumadin. He has an appointment with [facility]TS on 10-02-2017 for evaluation of TAA (4.5cm).  He endorses a previous history of HTN (10 Years ago) and was previously taking an antihypertensive for 3 years (can not recall the name) and was taken off due to normotensive readings. He currently denies chest pain, shortness of breath, palpitations, lightheadedness, dizziness, headaches or BLE edema.  INR today is therapeutic (2.6). He was seen by Pharmacy and instructed to continue coumadin at 5mg  daily.    Review of Systems  Constitutional: Negative for fever, malaise/fatigue and weight loss.  HENT: Negative.  Negative for nosebleeds.   Eyes: Negative.  Negative for blurred  vision, double vision and photophobia.  Respiratory: Negative.  Negative for cough and shortness of breath.   Cardiovascular: Negative.  Negative for chest pain, palpitations and leg swelling.  Gastrointestinal: Positive for abdominal pain (mild). Negative for heartburn, nausea and vomiting.  Musculoskeletal: Negative.  Negative for myalgias.  Neurological: Positive for weakness (using walker to assist with mobility. Weakness improving. ). Negative for dizziness, focal weakness, seizures and headaches.  Psychiatric/Behavioral: Negative.  Negative for suicidal ideas.    Past Medical History:  Diagnosis Date  . Hypertension   . Stroke [facility])     Past Surgical History:  Procedure Laterality Date  . ENDOVASCULAR STENT INSERTION N/A 07/01/2017   Procedure: ENDOVASCULAR STENT GRAFT INSERTION;  Surgeon: Serafina Mitchell, MD;  Location: [facility] OR;  Service: Vascular;  Laterality: N/A;  . THROMBECTOMY FEMORAL ARTERY Bilateral 07/01/2017   Procedure: Bilateral IlioFemoral Thrombectomies;  Surgeon: Serafina Mitchell, MD;  Location: [facility];  Service: Vascular;  Laterality: Bilateral;    History reviewed. No pertinent family history.  Social History Reviewed with no changes to be made today.   Outpatient Medications Prior to Visit  Medication Sig Dispense Refill  . acetaminophen (TYLENOL) 500 MG tablet Take 500 mg by mouth every 6 (six) hours as needed (for pain or headaches).    Marland Kitchen aspirin 81 MG EC tablet Take 1 tablet (81 mg total) by mouth daily. 100 tablet 0  . atorvastatin (LIPITOR) 80 MG tablet Take 1 tablet (80 mg total) by mouth daily at 6 PM. 30 tablet 3  . gabapentin (NEURONTIN) 100 MG capsule Take 1 capsule (  100 mg total) by mouth at bedtime. 30 capsule 3  . warfarin (COUMADIN) 5 MG tablet Take 1 tablet (5 mg total) by mouth daily at 6 PM. 30 tablet 3  . pantoprazole (PROTONIX) 40 MG tablet Take 1 tablet (40 mg total) by mouth daily. 30 tablet 3   No facility-administered medications prior to  visit.     No Known Allergies     Objective:    BP 135/84 (BP Location: Left Arm, Patient Position: Sitting, Cuff Size: Normal)   Pulse 90   Temp 98.7 F (37.1 C) (Oral)   Ht 5\' 7"  (1.702 m)   Wt 128 lb 9.6 oz (58.3 kg)   SpO2 95%   BMI 20.14 kg/m  Wt Readings from Last 3 Encounters:  09/26/17 128 lb 9.6 oz (58.3 kg)  09/11/17 127 lb (57.6 kg)  09/03/17 127 lb (57.6 kg)    Physical Exam  Constitutional: He is oriented to person, place, and time. He appears well-developed and well-nourished. He is cooperative.  HENT:  Head: Normocephalic and atraumatic.  Eyes: EOM are normal.  Neck: Normal range of motion.  Cardiovascular: Normal rate, regular rhythm, normal heart sounds and intact distal pulses. Exam reveals no gallop and no friction rub.  No murmur heard. Pulmonary/Chest: Effort normal and breath sounds normal. No tachypnea. No respiratory distress. He has no decreased breath sounds. He has no wheezes. He has no rhonchi. He has no rales. He exhibits no tenderness.  Abdominal: Soft. Bowel sounds are normal. He exhibits no distension and no mass. There is tenderness in the right lower quadrant, suprapubic area and left lower quadrant. There is no rebound and no guarding. No hernia.  Musculoskeletal: Normal range of motion. He exhibits no edema.  Neurological: He is alert and oriented to person, place, and time. Gait abnormal. Coordination normal.  Skin: Skin is warm and dry.  Psychiatric: He has a normal mood and affect. His behavior is normal. Judgment and thought content normal.  Nursing note and vitals reviewed.     Patient has been counseled extensively about nutrition and exercise as well as the importance of adherence with medications and regular follow-up. The patient was given clear instructions to go to ER or return to medical center if symptoms don't improve, worsen or new problems develop. The patient verbalized understanding.   Follow-up: Return in about 6 weeks  (around 11/07/2017) for BP recheck.   Gildardo Pounds, FNP-BC [facility] [place], [place]   09/26/2017, 2:11 PM

## 2017-09-26 NOTE — Progress Notes (Signed)
inr

## 2017-10-01 ENCOUNTER — Other Ambulatory Visit: Payer: Self-pay

## 2017-10-01 ENCOUNTER — Ambulatory Visit (INDEPENDENT_AMBULATORY_CARE_PROVIDER_SITE_OTHER): Payer: Private Health Insurance - Indemnity | Admitting: Surgery

## 2017-10-01 ENCOUNTER — Encounter: Payer: Self-pay | Admitting: Surgery

## 2017-10-01 VITALS — BP 124/86 | HR 68 | Resp 18 | Ht 67.0 in | Wt 128.0 lb

## 2017-10-01 DIAGNOSIS — I714 Abdominal aortic aneurysm, without rupture, unspecified: Secondary | ICD-10-CM

## 2017-10-01 NOTE — Progress Notes (Signed)
Vascular and Vein Specialist of [facility]  Patient name: Thomas [name] MRN: [id number] DOB: June 29, 1955 Sex: male   REASON FOR VISIT:    Follow up  HISOTRY OF PRESENT ILLNESS:    Thomas [name] is a 62 y.o. male who returns today for follow-up.  On 07/01/2017 he underwent endovascular repair of a ruptured abdominal aortic aneurysm.  This was initially done percutaneously however at the conclusion of the procedure was unhappy with blood flow to his feet and therefore bilateral femoral artery exposure was performed with bilateral endarterectomy.  Postoperatively, the patient continued to complain of pain in his abdomen as well as numbness in his right leg.  He has CT scan that showed residual thrombus within the right limb of his graft, however he continued to have palpable pedal pulses.  There is also concern of possible infection.  He was ultimately discharged however he was readmitted on 522 for concerns of aortic graft infection.  I had felt that his CT scan likely reflected inflammatory changes without infection.   He is still having difficulty walking.  He says his hips are getting weak.  He does have some burning on the bottom of his feet.  PAST MEDICAL HISTORY:   Past Medical History:  Diagnosis Date  . Hypertension   . Stroke [facility])      FAMILY HISTORY:   History reviewed. No pertinent family history.  SOCIAL HISTORY:   Social History   Tobacco Use  . Smoking status: Former Smoker    Types: Cigarettes    Last attempt to quit: 07/16/2017    Years since quitting: 0.2  . Smokeless tobacco: Never Used  Substance Use Topics  . Alcohol use: No     ALLERGIES:   No Known Allergies   CURRENT MEDICATIONS:   Current Outpatient Medications  Medication Sig Dispense Refill  . acetaminophen (TYLENOL) 500 MG tablet Take 500 mg by mouth every 6 (six) hours as needed (for pain or headaches).    Marland Kitchen aspirin 81 MG EC tablet Take 1 tablet (81 mg total)  by mouth daily. 100 tablet 0  . atorvastatin (LIPITOR) 80 MG tablet Take 1 tablet (80 mg total) by mouth daily at 6 PM. 30 tablet 3  . gabapentin (NEURONTIN) 100 MG capsule Take 1 capsule (100 mg total) by mouth at bedtime. 30 capsule 3  . pantoprazole (PROTONIX) 40 MG tablet Take 1 tablet (40 mg total) by mouth daily. 30 tablet 3  . warfarin (COUMADIN) 5 MG tablet Take 1 tablet (5 mg total) by mouth daily at 6 PM. 30 tablet 3   No current facility-administered medications for this visit.     REVIEW OF SYSTEMS:   [X]  denotes positive finding, [ ]  denotes negative finding Cardiac  Comments:  Chest pain or chest pressure:    Shortness of breath upon exertion:    Short of breath when lying flat:    Irregular heart rhythm:        Vascular    Pain in calf, thigh, or hip brought on by ambulation:    Pain in feet at night that wakes you up from your sleep:     Blood clot in your veins:    Leg swelling:         Pulmonary    Oxygen at home:    Productive cough:     Wheezing:         Neurologic    Sudden weakness in arms or legs:  x   Sudden numbness  in arms or legs:  x   Sudden onset of difficulty speaking or slurred speech:    Temporary loss of vision in one eye:     Problems with dizziness:         Gastrointestinal    Blood in stool:     Vomited blood:         Genitourinary    Burning when urinating:     Blood in urine:        Psychiatric    Major depression:         Hematologic    Bleeding problems:    Problems with blood clotting too easily:        Skin    Rashes or ulcers:        Constitutional    Fever or chills:      PHYSICAL EXAM:   Vitals:   10/01/17 0855  BP: 124/86  Pulse: 68  Resp: 18  SpO2: 98%  Weight: 128 lb (58.1 kg)  Height: 5\' 7"  (1.702 m)    GENERAL: The patient is a well-nourished male, in no acute distress. The vital signs are documented above. CARDIAC: There is a regular rate and rhythm.  VASCULAR: Palpable pedal pulses PULMONARY:  Non-labored respirations ABDOMEN: Soft and non-tender with normal pitched bowel sounds.  MUSCULOSKELETAL: There are no major deformities or cyanosis. SKIN: There are no ulcers or rashes noted. PSYCHIATRIC: The patient has a normal affect.  STUDIES:   I have reviewed his CT scan with the following findings:  The patient is status post endovascular repair of ruptured infrarenal abdominal aortic aneurysm. No evidence of endoleak or stent graft migration.  The aneurysm sac is essentially unchanged from the most recent comparison CT, measuring slightly smaller, with a maximum diameter of 7.1 cm. Mural enhancement persists, presumably reactive/inflammatory.  Resolving retroperitoneal hematoma.  New stenosis of the proximal right superficial femoral artery.  Redemonstration of possible aneurysm/pseudoaneurysm of the proximal right renal artery, with associated stenosis.  Redemonstration of hyperenhancing lesion of the left adrenal gland, unchanged over time. The nodule cannot be characterized as an adenoma, with differential diagnosis including pheochromocytoma. If further diagnostic imaging is warranted, consider MR or adrenal protocol CT. Alternatively, future surveillance CTA for the aneurysm may be performed with additional CT phases (including precontrast CT and a 15 minutes delayed CT) of this anatomy.  Cholelithiasis.  Emphysema.  MEDICAL ISSUES:   Status post endovascular repair of ruptured aortic aneurysm: From a vascular perspective, the patient is recovering nicely.  Unfortunately he continues to have difficulty with weakness in his legs and walking.  He has palpable pedal pulses.  I do not think this is a vascular issue.  Potentially could be related to the inflammatory process associated with his ruptured aneurysm.  This is being evaluated by multiple other physicians.  From a vascular perspective, he does not have acute issues.  He will follow-up in 6 months with  a repeat ultrasound.    Annamarie Major, MD [facility] and Vein Specialists of [facility] [phone] Pager [phone]

## 2017-10-02 ENCOUNTER — Encounter: Payer: Self-pay | Admitting: Thoracic Surgery (Cardiothoracic Vascular Surgery)

## 2017-10-02 ENCOUNTER — Institutional Professional Consult (permissible substitution) (INDEPENDENT_AMBULATORY_CARE_PROVIDER_SITE_OTHER): Payer: Private Health Insurance - Indemnity | Admitting: Thoracic Surgery (Cardiothoracic Vascular Surgery)

## 2017-10-02 ENCOUNTER — Other Ambulatory Visit: Payer: Self-pay

## 2017-10-02 VITALS — BP 122/80 | HR 84 | Resp 18 | Ht 67.0 in | Wt 130.4 lb

## 2017-10-02 DIAGNOSIS — I712 Thoracic aortic aneurysm, without rupture, unspecified: Secondary | ICD-10-CM

## 2017-10-02 NOTE — Progress Notes (Signed)
PCP is Gildardo Pounds, NP Referring Provider is Love, Ivan Anchors, PA-C  Chief Complaint  Patient presents with  . Thoracic Aortic Aneurysm    New Patient consultation, CTA Chest 08/17/17, ECHO 08/16/17    HPI: Thomas [name] is sent for consultation regarding an ascending aneurysm.  Thomas [name] is a 62 year old [place] gentleman with a past history significant for hypertension, stroke, and ruptured abdominal aortic aneurysm.  He was admitted in April with fever, chills, nonproductive cough, and chest pain.  EKG showed ST elevation.  This was felt to be acute pericarditis.  He was started on aspirin.  He also started on azithromycin and Rocephin for possible pneumonia.  An MRI of the brain was done due to dizziness.  It showed 3 acute CVAs in the left cerebellum.  Blood cultures were positive for pneumococcus.  He developed acute abdominal pain and hypotension on 07/01/2017.  A CT showed a 6.6 cm ascending aneurysm.  He underwent emergent repair of a ruptured abdominal aneurysm on 07/01/2017 by Dr. Trula Slade.  He then had to go back to the OR for bilateral iliofemoral endarterectomies.  He completed 6 weeks of ceftriaxone on 08/12/2017.  He resented back to the emergency room on 08/15/2017 with complaint of bilateral lower extremity weakness, pelvic pain, and headache.  He was found to have bilateral cerebellar infarcts.  An echocardiogram showed normal LV function.  There was mild to moderate aortic regurgitation.  No vegetations were seen.  A CT angiogram of the chest showed a 4.5 cm ascending aneurysm with soft plaque in the ascending aorta and arch.  He continues to have difficulty walking and is using a walker.  He has pain in his feet.  He has problems with his balance.  He continues to have pain in both groins.  He denies any chest pain.  Past Medical History:  Diagnosis Date  . Hypertension   . Stroke [facility])     Past Surgical History:  Procedure Laterality Date  . ENDOVASCULAR STENT INSERTION N/A  07/01/2017   Procedure: ENDOVASCULAR STENT GRAFT INSERTION;  Surgeon: Serafina Mitchell, MD;  Location: [facility] OR;  Service: Vascular;  Laterality: N/A;  . THROMBECTOMY FEMORAL ARTERY Bilateral 07/01/2017   Procedure: Bilateral IlioFemoral Thrombectomies;  Surgeon: Serafina Mitchell, MD;  Location: [facility] OR;  Service: Vascular;  Laterality: Bilateral;    No family history on file.  Social History Social History   Tobacco Use  . Smoking status: Former Smoker    Types: Cigarettes    Last attempt to quit: 07/16/2017    Years since quitting: 0.2  . Smokeless tobacco: Never Used  Substance Use Topics  . Alcohol use: No  . Drug use: No    Current Outpatient Medications  Medication Sig Dispense Refill  . acetaminophen (TYLENOL) 500 MG tablet Take 500 mg by mouth every 6 (six) hours as needed (for pain or headaches).    Marland Kitchen aspirin 81 MG EC tablet Take 1 tablet (81 mg total) by mouth daily. 100 tablet 0  . atorvastatin (LIPITOR) 80 MG tablet Take 1 tablet (80 mg total) by mouth daily at 6 PM. 30 tablet 3  . gabapentin (NEURONTIN) 100 MG capsule Take 1 capsule (100 mg total) by mouth at bedtime. 30 capsule 3  . pantoprazole (PROTONIX) 40 MG tablet Take 1 tablet (40 mg total) by mouth daily. 30 tablet 3  . warfarin (COUMADIN) 5 MG tablet Take 1 tablet (5 mg total) by mouth daily at 6 PM. 30 tablet 3  No current facility-administered medications for this visit.     No Known Allergies  Review of Systems  Constitutional: Positive for activity change. Negative for appetite change and unexpected weight change.  HENT: Positive for dental problem and hearing loss.   Eyes: Positive for visual disturbance (Blurry).  Respiratory: Negative for shortness of breath.   Cardiovascular: Negative for chest pain and leg swelling.  Gastrointestinal: Positive for abdominal pain (Both groins since surgery). Negative for abdominal distention.  Genitourinary: Negative for difficulty urinating and dysuria.   Musculoskeletal: Positive for gait problem.  Neurological: Positive for weakness, numbness and headaches.       Stroke, neuropathy in feet  Hematological: Negative for adenopathy. Bruises/bleeds easily.  All other systems reviewed and are negative.   BP 122/80 (BP Location: Left Arm, Patient Position: Sitting, Cuff Size: Normal)   Pulse 84   Resp 18   Ht 5\' 7"  (1.702 m)   Wt 130 lb 6.4 oz (59.1 kg)   SpO2 96% Comment: RA  BMI 20.42 kg/m  Physical Exam  Constitutional: He is oriented to person, place, and time. He appears well-developed and well-nourished.  HENT:  Head: Normocephalic and atraumatic.  Mouth/Throat: No oropharyngeal exudate.  Poor dentition  Eyes: Conjunctivae and EOM are normal. No scleral icterus.  Neck: No thyromegaly present.  No carotid bruits  Cardiovascular: Normal rate, regular rhythm and normal heart sounds.  No murmur heard. Pulses:      Carotid pulses are 2+ on the right side, and 2+ on the left side.      Radial pulses are 2+ on the right side, and 1+ on the left side.       Dorsalis pedis pulses are 1+ on the right side, and 1+ on the left side.       Posterior tibial pulses are 1+ on the right side, and 1+ on the left side.  Pulmonary/Chest: Effort normal and breath sounds normal. He has no wheezes. He has no rales.  Abdominal: Soft. He exhibits no distension. There is no tenderness.  Musculoskeletal: He exhibits no edema.  Lymphadenopathy:    He has no cervical adenopathy.  Neurological: He is alert and oriented to person, place, and time. Coordination [facility] with a walker due to neuropathy) abnormal.  Skin: Skin is warm and dry.  Vitals reviewed.    Diagnostic Tests: CT ANGIOGRAPHY CHEST WITH CONTRAST  TECHNIQUE: Multidetector CT imaging of the chest was performed using the standard protocol during bolus administration of intravenous contrast. Multiplanar CT image reconstructions and MIPs were obtained to evaluate the vascular  anatomy.  CONTRAST:  <See Chart> ISOVUE-370 IOPAMIDOL (ISOVUE-370) INJECTION 76%  COMPARISON:  CT abdomen pelvis 07/08/2017  FINDINGS: Cardiovascular: 4.5 cm ascending thoracic aortic aneurysm with soft plaque along the ascending thoracic aorta, arch and to a lesser degree the descending thoracic aorta. Minimal ulcerated plaque posteriorly within the ascending thoracic aorta, series 5/71. No dissection is visualized. Conventional branch pattern of the great vessels atherosclerosis. No significant stenosis. Left vertebral arterial dominance noted. Heart is top-normal without pericardial effusion or thickening.  Mediastinum/Nodes: Normal thyroid without mass. Patent midline trachea and mainstem bronchi. Mild esophageal thickening proximally query esophagitis. No large central pulmonary embolus.  Lungs/Pleura: Bilateral centrilobular and paraseptal emphysema. No dominant mass. Dependent bibasilar atelectasis and mild pleural thickening along the left major fissure.  Upper Abdomen: Nonspecific 1.6 x 1 cm hyperdense nodule of the left adrenal gland. Simple cyst noted off the upper pole of the right kidney measuring 2 cm. An interpolar  left renal cyst is also noted too small to further characterize measuring approximately 7 mm. Numerous gallstones are present within the gallbladder. Remainder of the abdomen is unremarkable.  Musculoskeletal: No chest wall abnormality. No acute or significant osseous findings.  Review of the MIP images confirms the above findings.  IMPRESSION: 1. 4.5 cm ascending thoracic aortic aneurysm with soft plaque as above described, some which is minimally ulcerated along the posterior aspect of the distal ascending aorta. No dissection. Recommend semi-annual imaging followup by CTA or MRA and referral to cardiothoracic surgery if not already obtained. This recommendation follows 2010 ACCF/AHA/AATS/ACR/ASA/SCA/SCAI/SIR/STS/SVM Guidelines for the  Diagnosis and Management of Patients With Thoracic Aortic Disease. Circulation. 2010; 121: V409-W119 2. Mild cardiomegaly without pericardial effusion or thickening. 3. No acute pulmonary embolus. 4. Bilateral centrilobular and paraseptal emphysema with dependent bibasilar atelectasis. 5. Indeterminate left adrenal nodule measuring 1.6 x 1 cm. This could be further correlated with dedicated CT or MRI. 6. Uncomplicated cholelithiasis. 7. Bilateral renal cysts.  Aortic aneurysm NOS (ICD10-I71.9).  Aortic Atherosclerosis (ICD10-I70.0) and Emphysema (ICD10-J43.9).   Electronically Signed   By: Ashley Royalty M.D.   On: 08/17/2017 14:51 I personally reviewed the CT images and concur with the findings noted above.  Impression: Thomas [name] is a 62 year old man with a past history of hypertension, multiple cerebellar strokes, emergent endovascular repair of a ruptured abdominal aortic aneurysm, and neuropathy.  He was noted on a CT back in May to have a 4.5 cm ascending aneurysm with significant irregular plaque throughout his ascending aorta and arch.  Aortic atherosclerosis with ascending and arch aneurysm-no indication for surgery by size criteria.  It is possible that his ascending aortic and arch plaque is the source of his embolic CVAs.  That is a secondary indication for surgery.  I do not feel that Thomas [name] is a surgical candidate at this point with his recent strokes, infection, ruptured abdominal aneurysm, and deconditioning.  I think the risk of surgery outweighs the benefit at this point.  I agree with continuing with anticoagulation and will reassess in 6 months.  No history of hypertension and blood pressure is currently well controlled.  Abdominal aortic aneurysm-underwent repair for rupture in April.  Followed by Dr. Trula Slade.  He saw him yesterday.  He has an appointment for follow-up with Dr. Trula Slade in 6 months with an ultrasound of the abdomen.  Plan: Return in 6 months with CT  angiogram of chest  Melrose Nakayama, MD [facility] [phone]

## 2017-10-04 ENCOUNTER — Other Ambulatory Visit: Payer: Self-pay

## 2017-10-04 DIAGNOSIS — Z8679 Personal history of other diseases of the circulatory system: Secondary | ICD-10-CM

## 2017-10-04 DIAGNOSIS — Z9889 Other specified postprocedural states: Secondary | ICD-10-CM

## 2017-10-04 DIAGNOSIS — I714 Abdominal aortic aneurysm, without rupture, unspecified: Secondary | ICD-10-CM

## 2017-10-24 DIAGNOSIS — Z0271 Encounter for disability determination: Secondary | ICD-10-CM

## 2017-10-26 ENCOUNTER — Ambulatory Visit: Payer: Private Health Insurance - Indemnity | Attending: Nurse Practitioner | Admitting: Pharmacist

## 2017-10-26 ENCOUNTER — Ambulatory Visit: Payer: Private Health Insurance - Indemnity | Admitting: Nurse Practitioner

## 2017-10-26 ENCOUNTER — Encounter: Payer: Self-pay | Admitting: Pharmacist

## 2017-10-26 VITALS — BP 115/70 | HR 64

## 2017-10-26 DIAGNOSIS — Z7901 Long term (current) use of anticoagulants: Secondary | ICD-10-CM | POA: Insufficient documentation

## 2017-10-26 DIAGNOSIS — I663 Occlusion and stenosis of cerebellar arteries: Secondary | ICD-10-CM | POA: Diagnosis present

## 2017-10-26 LAB — POCT INR: INR: 4.7 — AB (ref 2.0–3.0)

## 2017-10-26 NOTE — Patient Instructions (Addendum)
Thank you for coming to see me today.   1. No warfarin today.   2. No warfarin tomorrow.   3. Continue taking 1 tablet daily starting Sunday.   Blood pressure looks good today.

## 2017-10-26 NOTE — Progress Notes (Signed)
    Pharmacy Anticoagulation Clinic  Subjective: Patient presents today for INR monitoring and BP check. 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  Current dose of warfarin: 5 mg daily  Adherence to warfarin: yes Signs/symptoms of bleeding: none Recent changes in diet: states that he's eating less Recent changes in medications: denies Upcoming procedures that may impact anticoagulation: none  Objective: Vitals:   10/26/17 1456  BP: 115/70  Pulse: 64   Today's INR = 4.7  Lab Results  Component Value Date   INR 2.6 09/26/2017   INR 2.5 09/12/2017   INR 2.33 08/29/2017     Assessment and Plan: Anticoagulation: Patient is Supratherapeutic based on patient's INR of 4.7 and patient's INR goal of 2-3. Will Decrease current warfarin dose:   - Hold warfarin today and tomorrow - Continue warfarin on Sunday: resume taking 1 tablet daily.   Patient verbalized understanding and was provided with written instructions. Next INR check planned for 11/02/17.   BP not elevated today. Pt denies CP, SOB, HA, or dizziness. Will forward information to PCP.   Benard Halsted, PharmD, [place] [phone]

## 2017-11-01 ENCOUNTER — Ambulatory Visit: Payer: Private Health Insurance - Indemnity

## 2017-11-02 ENCOUNTER — Ambulatory Visit: Payer: Private Health Insurance - Indemnity

## 2017-11-05 ENCOUNTER — Encounter: Payer: Self-pay | Admitting: Pharmacist

## 2017-11-05 ENCOUNTER — Ambulatory Visit: Payer: Private Health Insurance - Indemnity | Attending: Nurse Practitioner | Admitting: Pharmacist

## 2017-11-05 DIAGNOSIS — I663 Occlusion and stenosis of cerebellar arteries: Secondary | ICD-10-CM | POA: Diagnosis present

## 2017-11-05 DIAGNOSIS — Z7901 Long term (current) use of anticoagulants: Secondary | ICD-10-CM | POA: Insufficient documentation

## 2017-11-05 DIAGNOSIS — I63542 Cerebral infarction due to unspecified occlusion or stenosis of left cerebellar artery: Secondary | ICD-10-CM

## 2017-11-05 LAB — POCT INR: INR: 4.3 — AB (ref 2.0–3.0)

## 2017-11-05 MED ORDER — WARFARIN SODIUM 5 MG PO TABS: ORAL_TABLET | ORAL | 3 refills | Status: DC

## 2017-11-05 NOTE — Progress Notes (Signed)
    Pharmacy Anticoagulation Clinic  Subjective: Patient presents today for INR monitoring. 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  Current dose of warfarin: 5 mg daily (1 tablet daily)  Adherence to warfarin: yes Signs/symptoms of bleeding: none Recent changes in diet: none Recent changes in medications: none Upcoming procedures that may impact anticoagulation: none   Objective: Today's INR = 4.3   Lab Results  Component Value Date   INR 4.7 (A) 10/26/2017   INR 2.6 09/26/2017   INR 2.5 09/12/2017     Assessment and Plan: Anticoagulation: Patient is Supratherapeutic based on patient's INR of 4.3 and patient's INR goal of 2-3. Will Decrease current warfarin dose:   - Patient to hold warfarin today.  - Starting tomorrow, take 1 tablet daily with 1/2 tablet on Mondays and Fridays (total weekly dose decrease from 35 to 30)  Patient verbalized understanding and was provided with written instructions. Next INR check planned for 2 weeks.

## 2017-11-05 NOTE — Patient Instructions (Signed)
Thank you for coming to see me today. Please do the following:  1. No warfarin today.   2. Then start taking 1 tablet every day except 1/2 tablets on Mondays and Fridays.

## 2017-11-08 ENCOUNTER — Ambulatory Visit (INDEPENDENT_AMBULATORY_CARE_PROVIDER_SITE_OTHER): Payer: Private Health Insurance - Indemnity | Admitting: Adult Health

## 2017-11-08 ENCOUNTER — Telehealth: Payer: Self-pay | Admitting: Adult Health

## 2017-11-08 ENCOUNTER — Encounter: Payer: Self-pay | Admitting: Adult Health

## 2017-11-08 VITALS — BP 138/87 | HR 76 | Wt 132.0 lb

## 2017-11-08 DIAGNOSIS — I712 Thoracic aortic aneurysm, without rupture, unspecified: Secondary | ICD-10-CM

## 2017-11-08 DIAGNOSIS — I63431 Cerebral infarction due to embolism of right posterior cerebral artery: Secondary | ICD-10-CM | POA: Diagnosis not present

## 2017-11-08 DIAGNOSIS — I10 Essential (primary) hypertension: Secondary | ICD-10-CM | POA: Diagnosis not present

## 2017-11-08 DIAGNOSIS — Z9889 Other specified postprocedural states: Secondary | ICD-10-CM

## 2017-11-08 DIAGNOSIS — E785 Hyperlipidemia, unspecified: Secondary | ICD-10-CM

## 2017-11-08 DIAGNOSIS — Z8679 Personal history of other diseases of the circulatory system: Secondary | ICD-10-CM

## 2017-11-08 NOTE — Progress Notes (Signed)
STROKE NEUROLOGY FOLLOW UP NOTE  NAME: Thomas [name] DOB: 04/03/1955  REASON FOR VISIT: stroke follow up HISTORY FROM: pt and chart  Today we had the pleasure of seeing Nickolis Diel in follow-up at our Neurology Clinic. Pt was accompanied by daughter.   History Summary Mr. Zymeir Salminen is a 62 y.o. male with history of hypertension, history of tobacco use and previous strokes by imaging presenting with weakness, chills, dizziness, cough, left-sided chest pain, abdominal pain, nausea and vomiting.   CTA aortic artery showed ruptured AAA. S/p emergent endovascular repair of ruptured AAA, bilateral iliofemoral endarterectomy, and BLE thrumboembolectomies.  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  Symptoms consistent with PVD, no further surgery performed at that time.  Patient numbness and weakness on the right LE gradually improved over time.  Due to dizziness, chills, nausea vomiting, patient had MRI with and without contrast showed 3 acute small left cerebellar infarcts, old small bilateral cerebellar infarcts as well as old BG and thalamus infarcts.  MRA head and neck revealed right M1 chronic occlusion, bilateral PCA stenosis in the terminal right VA stenosis, no mycotic aneurysm.  EF 60 to 65%.  LDL 72 and A1c 5.6.  Initially plan for TEE to rule out endocarditis, however, patient deemed high risk, then started empiric treatment of Rocephin for 6 weeks.  After anemia stabilized, patient started on aspirin and Lipitor.  Discharging stable condition, recommend quit smoking.  08/08/17 visit JX: During the interval time, the patient has been doing well from a stroke standpoint. Patient right LE still has intermittent numbness, but brief and mild.  He still has left lower quadrant abdominal pain likely due to left retroperitoneal hematoma.  However, repeat CT abdomen pelvis on  07/08/2017 showed decreasing size of hematoma.  He has appointment with [facility] on 08/13/2017 for repeat CT angios abdomen and pelvis.  He is going to finish 6 weeks of antibiotics this Sunday.  BP today 150/92 in clinic. At this appointment, patient stated that he had intermittent vertigo episodes since 2014.  It happened suddenly, most time while he was in sitting position, either sitting a desk or driving in a car.  Room spinning sensation, lasting 1 to 2 hours with nausea vomiting.  No headache, or weakness numbness.  Also has left ear ringing initially, but then about 3 to 4 years ago left ear severe hearing loss.  Patient had ENT referral several years ago, recommend MRI, however patient has no insurance at that time and he did not follow-up after.  Interval history 11/08/17:  Since previous visit, patient was seen in ED on 08/16/2017 with complaints of generalized weakness and right leg ataxia.  MRI head showed bilateral cerebellar and right lateral medullary infarcts.  CTA head and neck showed chronic right M1 occlusion and right VA origin stenosis.  Echocardiogram showed normal EF of 65 to 70%.  CTA chest showed 4.5 ascending thoracic aortic aneurysm with soft plaque.  Due to this finding, patient was started on warfarin with INR goal of 2-3 and also recommended to continue aspirin and atorvastatin.  Dr. Erlinda Hong felt that stroke was embolic due to extensive soft plaque.  It was recommended to repeat CTA aorta 3 months post discharge to decide further course of anticoagulation or antiplatelet.  PT/OT recommended CIR for continued deficits.   Patient has since returned home where he currently lives with his daughter.  He continues to have residual right-sided weakness but does complain of muscle aches and pains.  Patient is not undergoing therapies at this time but does state he does his own exercises to help strengthen his right leg.  He continues to take aspirin and Coumadin and does state he gets occasional  nosebleeds approximately every 2 to 3 days with a minimal amount of blood that last approximately 5 minutes and will stop on their own.  Recent INR 4.3 on 11/05/2017.  Patient does go to Coumadin clinic for management and after medication adjustments were made they will repeat INR on 11/19/2017.  Patient continues to take atorvastatin with possible side effects of myalgias.  Blood pressure today 138/87.  Denies new or worsening stroke/TIA symptoms.   REVIEW OF SYSTEMS: Full 14 system review of systems performed and notable only for those listed below and in HPI above, all others are negative:  Appetite change, blurred vision, walking difficulty, headache, numbness and weakness  The following represents the patient's updated allergies and side effects list: No Known Allergies  The neurologically relevant items on the patient's problem list were reviewed on today's visit.  Neurologic Examination  A problem focused neurological exam (12 or more points of the single system neurologic examination, vital signs counts as 1 point, cranial nerves count for 8 points) was performed.  Blood pressure 138/87, pulse 76, weight 132 lb (59.9 kg).  General - Well nourished, well developed, in no apparent distress.  Ophthalmologic - Fundi not visualized due to noncooperation.  Cardiovascular - Regular rate and rhythm.  Mental Status -  Level of arousal and orientation to time, place, and person were intact. Language including expression, naming, repetition, comprehension was assessed and found intact. Attention span and concentration were normal. Fund of Knowledge was assessed and was intact.  Cranial Nerves II - XII - II - Visual field intact OU. III, IV, VI - Extraocular movements intact. V - Facial sensation intact bilaterally. VII - Facial movement intact bilaterally. VIII - left ear hearing loss, b/l normal rinne's test, but weber's test on the right. X - Palate elevates symmetrically. XI - Chin  turning & shoulder shrug intact bilaterally. XII - Tongue protrusion intact.  Motor Strength - The patient's strength was normal in all extremities except for RLE 4+/5. bulk was normal and fasciculations were absent.   Motor Tone - Muscle tone was assessed at the neck and appendages and was normal.  Reflexes - The patient's reflexes were 1+ in all extremities and he had no pathological reflexes.  Sensory - Light touch, temperature/pinprick, vibration and proprioception, and Romberg testing were assessed and were normal.    Coordination - The patient had normal movements in the hands and feet with no ataxia or dysmetria.  Tremor was absent.  Gait and Station - The patient's transfers, posture, gait, station, and turns were observed and does use rolling walker due to continued right lower extremity weakness   Functional score  mRS = 2   0 - No symptoms.   1 - No significant disability. Able to carry out all usual activities, despite some symptoms.   2 - Slight disability. Able to look after own affairs without assistance, but unable to carry out all previous activities.   3 - Moderate disability. Requires some help, but able to walk unassisted.   4 - Moderately severe disability. Unable to attend to own bodily needs without assistance, and unable to walk unassisted.   5 - Severe disability. Requires constant nursing care  and attention, bedridden, incontinent.   6 - Dead.   NIH Stroke Scale = 0  Data reviewed: I personally reviewed the images and agree with the radiology interpretations.  CT head without contrast 08/15/2017 IMPRESSION: Old bilateral basal ganglia lacunar infarcts and sequelae of chronic small vessel disease without acute intracranial abnormality.  MR brain without contrast 08/16/2017 IMPRESSION: 1. Acute subcentimeter infarcts bilateral cerebella and RIGHT cervicomedullary junction. 2. Slow flow versus occluded RIGHT vertebral artery. 3. Old bilateral  basal ganglia and thalami lacunar infarcts. Old small cerebellar infarcts.  CT a head/neck with and without contrast 08/16/2017 IMPRESSION: 1. No new emergent large vessel occlusion. 2. 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 3. Unchanged severe stenosis or occlusion of the origin of the right vertebral artery, which is otherwise patent to the skull base. 4. No hemodynamically significant stenosis of the carotid arteries. 5. Bulky noncalcified plaque in the aortic arch with moderate stenosis of the proximal left subclavian artery. 6. Dilated right laryngeal ventricle and piriform sinus suggesting right-sided vocal cord paralysis.  CTA chest aorta 08/17/2017 IMPRESSION: 1. 4.5 cm ascending thoracic aortic aneurysm with soft plaque as above described, some which is minimally ulcerated along the posterior aspect of the distal ascending aorta. No dissection. Recommend semi-annual imaging followup by CTA or MRA and referral to cardiothoracic surgery if not already obtained. This recommendation follows 2010 ACCF/AHA/AATS/ACR/ASA/SCA/SCAI/SIR/STS/SVM Guidelines for the Diagnosis and Management of Patients With Thoracic Aortic Disease. Circulation. 2010; 121: M426-S341 2. Mild cardiomegaly without pericardial effusion or thickening. 3. No acute pulmonary embolus. 4. Bilateral centrilobular and paraseptal emphysema with dependent bibasilar atelectasis. 5. Indeterminate left adrenal nodule measuring 1.6 x 1 cm. This could be further correlated with dedicated CT or MRI. 6. Uncomplicated cholelithiasis. 7. Bilateral renal cysts.   Assessment: As you may recall, he is a 62 y.o. Asian male with PMH of hypertension, tobacco use and previous strokes by imaging admitted on 06/28/17 for AAA rupture s/p emergent endovascular repair, bilateral iliofemoral endarterectomy, and BLE thrumboembolectomies.  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   Patient return to ED with right lower extremity weakness and ataxia on 08/15/2017 and was found to have bilateral cerebellar and right lateral medullary small infarcts which was believed to be due to thoracic aortic aneurysm with soft plaque and was started on Coumadin along with aspirin and Lipitor.  Plan:  - continue ASA, Coumadin and lipitor for stroke prevention and peripheral vascular disease  - follow up with vascular surgery and cardiology as scheduled -Due to possible statin myalgias, repeat lipid levels today and consider possible decrease of Lipitor or change of statin -Continue to follow with Coumadin clinic for Coumadin management and INR monitoring -Repeat CTA aorta to follow-up on thoracic aortic aneurysm -per Dr. Phoebe Sharps notes, after repeat exam will determine future anticoagulation -Advised to continue home exercises for continued right lower extremity weakness - Follow up with your primary care physician for stroke risk factor modification. Recommend maintain blood pressure goal <130/80, diabetes with hemoglobin A1c goal below 7.0% and lipids with LDL cholesterol goal below 70 mg/dL.  - check BP at home and record.   Advised to follow-up in 3 months or call earlier if needed  I spent more than 25 minutes of face to face time with the patient. Greater than 50% of time was spent in counseling and coordination of care. We discussed  follow up with [facility], and cardiology, refer to ENT for meniere's disease.  Venancio Poisson, AGNP-BC  [facility] [address] [place], [place] [address]  Phone [phone] Fax [phone] Note: This document was prepared with digital dictation and possible smart phrase technology. Any transcriptional errors that result from this process are unintentional.

## 2017-11-08 NOTE — Telephone Encounter (Signed)
[employer] medicare order sent to [facility]. They obtain the auth and will reach out to the pt to schedule.  °

## 2017-11-08 NOTE — Progress Notes (Signed)
I agree with the above plan 

## 2017-11-08 NOTE — Patient Instructions (Signed)
Continue aspirin 81 mg daily and warfarin daily  and lipitor  for secondary stroke prevention  We will check cholesterol levels today and decide if we can lower dose back down or change cholesterol medication  Repeat CTA aorta due to thoracic aortic aneurysm   Continue to follow up with PCP regarding cholesterol and blood pressure management   Continue to follow up with cardiology and vascular surgery as scheduled  Continue to monitor blood pressure at home  Maintain strict control of hypertension with blood pressure goal below 130/90, diabetes with hemoglobin A1c goal below 6.5% and cholesterol with LDL cholesterol (bad cholesterol) goal below 70 mg/dL. I also advised the patient to eat a healthy diet with plenty of whole grains, cereals, fruits and vegetables, exercise regularly and maintain ideal body weight.  Followup in the future with me in 3 months or call earlier if needed       Thank you for coming to see [place] at [facility]. I hope we have been able to provide you high quality care today.  You may receive a patient satisfaction survey over the next few weeks. We would appreciate your feedback and comments so that we may continue to improve ourselves and the health of our patients.

## 2017-11-09 ENCOUNTER — Other Ambulatory Visit: Payer: Self-pay | Admitting: Adult Health

## 2017-11-09 ENCOUNTER — Telehealth: Payer: Self-pay

## 2017-11-09 LAB — LIPID PANEL
Chol/HDL Ratio: 3.1 ratio (ref 0.0–5.0)
Cholesterol, Total: 126 mg/dL (ref 100–199)
HDL: 41 mg/dL (ref >39)
LDL Calculated: 64 mg/dL (ref 0–99)
Triglycerides: 106 mg/dL (ref 0–149)
VLDL Cholesterol Cal: 21 mg/dL (ref 5–40)

## 2017-11-09 MED ORDER — ROSUVASTATIN CALCIUM 10 MG PO TABS: 10.0000 mg | ORAL_TABLET | Freq: Every day | ORAL | 3 refills | Status: DC

## 2017-11-09 NOTE — Telephone Encounter (Signed)
-----   Message from Venancio Poisson, NP sent at 11/09/2017  7:48 AM EDT ----- Can you please notify patient that his cholesterol levels looked good with his LDL or bad cholesterol at 64. Recommend to stop lipitor and we will start Crestor. Prescription will be sent in to the pharmacy. Please have him notify us if his leg cramps do not resolve after 1 week of the change. Thank you.

## 2017-11-09 NOTE — Telephone Encounter (Signed)
Notes recorded by Marval Regal, RN on 11/09/2017 at 11:46 AM EDT Left vm for patients daughter Madlyn Frankel on his dpr about his lab work, and new medication change. ------

## 2017-11-09 NOTE — Progress Notes (Signed)
As patient complained of muscle aches at previous visit, this could possibly be due to statin myalgias. Recent LDL 64. Recommend stop lipitor 80 and will start Crestor 10.

## 2017-11-12 NOTE — Telephone Encounter (Signed)
Notes recorded by Marval Regal, RN on 11/12/2017 at 4:56 PM EDT Rn call Thomas pts daughter that Thomas Billow NP recommend pt stop taking the lipitor and start taking crestor. [facility] Rn stated if the patients leg cramps do not resolve after one week to give our office a call. The daughter verbalized understanding of the new change in med, and to call if leg cramps do not resolve. ------

## 2017-11-14 MED FILL — ROSUVASTATIN CALCIUM 10 MG: 10 | 30 days supply | Qty: 30 | Fill #0

## 2017-11-19 ENCOUNTER — Telehealth: Payer: Self-pay

## 2017-11-19 NOTE — Telephone Encounter (Signed)
CMA spoke to patient to inform his disability insurance form is ready for him to pick up.  Pt. Understood.

## 2017-11-20 ENCOUNTER — Ambulatory Visit: Payer: Private Health Insurance - Indemnity | Attending: Nurse Practitioner | Admitting: Pharmacist

## 2017-11-20 DIAGNOSIS — I63443 Cerebral infarction due to embolism of bilateral cerebellar arteries: Secondary | ICD-10-CM | POA: Diagnosis not present

## 2017-11-20 DIAGNOSIS — I663 Occlusion and stenosis of cerebellar arteries: Secondary | ICD-10-CM

## 2017-11-20 DIAGNOSIS — I63342 Cerebral infarction due to thrombosis of left cerebellar artery: Secondary | ICD-10-CM

## 2017-11-20 LAB — POCT INR: INR: 4.1 — AB (ref 2.0–3.0)

## 2017-12-04 ENCOUNTER — Ambulatory Visit: Payer: Private Health Insurance - Indemnity | Attending: Nurse Practitioner | Admitting: Pharmacist

## 2017-12-04 DIAGNOSIS — I663 Occlusion and stenosis of cerebellar arteries: Secondary | ICD-10-CM

## 2017-12-04 DIAGNOSIS — I63443 Cerebral infarction due to embolism of bilateral cerebellar arteries: Secondary | ICD-10-CM

## 2017-12-04 DIAGNOSIS — I63342 Cerebral infarction due to thrombosis of left cerebellar artery: Secondary | ICD-10-CM | POA: Insufficient documentation

## 2017-12-04 LAB — POCT INR: INR: 8 — AB (ref 2.0–3.0)

## 2017-12-04 MED FILL — GABAPENTIN 100 MG CAPSULE: 100 | 30 days supply | Qty: 30 | Fill #0

## 2017-12-04 MED FILL — WARFARIN SODIUM 5 MG TABLET: 5 | 30 days supply | Qty: 30 | Fill #0

## 2017-12-04 NOTE — Addendum Note (Signed)
Addended by: Octaviano Glow on: 12/04/2017 04:04 PM   Modules accepted: Orders

## 2017-12-04 NOTE — Addendum Note (Signed)
Addended by: Daisy Blossom, Annie Main L on: 12/04/2017 04:20 PM   Modules accepted: Orders

## 2017-12-05 LAB — PROTIME-INR
INR: 7.9 — AB (ref 0.8–1.2)
Prothrombin Time: 68 s — ABNORMAL HIGH (ref 9.1–12.0)

## 2017-12-06 ENCOUNTER — Ambulatory Visit: Payer: Private Health Insurance - Indemnity | Attending: Family Medicine | Admitting: Pharmacist

## 2017-12-06 DIAGNOSIS — I63342 Cerebral infarction due to thrombosis of left cerebellar artery: Secondary | ICD-10-CM

## 2017-12-06 DIAGNOSIS — I63443 Cerebral infarction due to embolism of bilateral cerebellar arteries: Secondary | ICD-10-CM

## 2017-12-06 DIAGNOSIS — I663 Occlusion and stenosis of cerebellar arteries: Secondary | ICD-10-CM

## 2017-12-06 LAB — POCT INR: INR: 3.4 — AB (ref 2.0–3.0)

## 2017-12-06 MED ORDER — WARFARIN SODIUM 2.5 MG PO TABS: 2.5000 mg | ORAL_TABLET | Freq: Every day | ORAL | 0 refills | Status: DC

## 2017-12-06 MED FILL — WARFARIN NA 2.5 MG TAB: 2.5 | 30 days supply | Qty: 30 | Fill #0

## 2017-12-06 NOTE — Progress Notes (Signed)
Addendum to today's encounter:   Pt presented Tuesday (12/04/17). Pt reported poor appetite. He is an elderly/frail gentleman. INR of 8 obtained at that encounter. Venous draw confirmed INR 7.9 with protime 68.0. I notified patient's PCP, Geryl Rankins who was in agreement that patient should hold Coumadin.   Pt was seen today. He is currently holding Coumadin and has been since Tuesday. INR today of 3.4. I will hold today and re-start warfarin at 2.5 mg daily on Friday (12/07/17). Pt to continue until I see him Monday. Case discussed with Dr. Margarita Rana (lead physician) who is in agreement.   Marylene Buerger, PharmD Candidate [employer] Class of 2021  Benard Halsted, PharmD, [place] [phone]

## 2017-12-10 ENCOUNTER — Ambulatory Visit: Payer: Private Health Insurance - Indemnity | Attending: Family Medicine | Admitting: Pharmacist

## 2017-12-10 ENCOUNTER — Telehealth: Payer: Self-pay | Admitting: Adult Health

## 2017-12-10 DIAGNOSIS — Z5181 Encounter for therapeutic drug level monitoring: Secondary | ICD-10-CM | POA: Insufficient documentation

## 2017-12-10 DIAGNOSIS — I63342 Cerebral infarction due to thrombosis of left cerebellar artery: Secondary | ICD-10-CM

## 2017-12-10 DIAGNOSIS — Z7901 Long term (current) use of anticoagulants: Secondary | ICD-10-CM | POA: Insufficient documentation

## 2017-12-10 DIAGNOSIS — I663 Occlusion and stenosis of cerebellar arteries: Secondary | ICD-10-CM

## 2017-12-10 DIAGNOSIS — I63443 Cerebral infarction due to embolism of bilateral cerebellar arteries: Secondary | ICD-10-CM

## 2017-12-10 LAB — POCT INR: INR: 1.6 — AB (ref 2.0–3.0)

## 2017-12-10 NOTE — Telephone Encounter (Signed)
Received a phone call from Thomas [name], Thomas [name] who has been managing coumadin and INR levels. INR levels were initially stable until around August. INR on 12/04/17 at 8.0. Medication adjustments made. Rechecked on 12/06/17 with INR 3.4. Rechecked today with INR  1.6. Thomas [name] believes this is falsely low due to medication adjustments. He called to inform us of difficulty managing his INR levels on coumadin. It was recommended by Dr. Erlinda Hong to repeat CTA chest and if aortic arch and proximal descending aorta thrombus showing improvement without protrusion to the lumen, coumadin could be stopped. This order was placed on 11/08/17. Per appt notes, there were multiple attempts to contact him to schedule this test. Attempted to reach daughter today but no answer. Voicemail left requesting call back. During phone conversation with Thomas [name], he stated that he will continue to try to manage INR on coumadin at this time. Will also reach out to Dr. Erlinda Hong for possible recommendations as far as coumadin management as he has managed this patient in this office and hospital in the past. Greatly appreciate call from [facility] on update of INR and coumadin status.

## 2017-12-10 NOTE — Progress Notes (Signed)
Discussed with Mr. Lasky the probability of a falsely low INR today. Additionally, he has very labile INRs. Was recently supratherapeutic (INR 8) with his previous maintenance plan 32.5 mg. His currently weekly regimen is 17.5 mg weekly.  He started his current plan last Thursday (12/06/17). I wanted to see him back this coming Thursday or Friday but he was only amenable to coming next Monday. Will reassess at that time.  Benard Halsted, PharmD, [place] [phone]

## 2017-12-16 NOTE — Telephone Encounter (Signed)
3 months anticoagulation usually stabilized the mural thrombus. CTA chest would be ideal to guide for further therapy, but if pt has difficulty to get CTA and INR is so difficult to control, I will recommend to d/c coumadin and keep on ASA 325mg . If we can repeat CTA later, that would be great. If not, let's continue the ASA and see how that works for the patients. Thanks.   Rosalin Hawking, MD PhD Stroke Neurology 12/16/2017 9:23 AM

## 2017-12-17 ENCOUNTER — Ambulatory Visit: Payer: Private Health Insurance - Indemnity | Attending: Family Medicine | Admitting: Pharmacist

## 2017-12-17 ENCOUNTER — Other Ambulatory Visit: Payer: Self-pay | Admitting: Pharmacist

## 2017-12-17 DIAGNOSIS — I63342 Cerebral infarction due to thrombosis of left cerebellar artery: Secondary | ICD-10-CM

## 2017-12-17 DIAGNOSIS — I663 Occlusion and stenosis of cerebellar arteries: Secondary | ICD-10-CM

## 2017-12-17 DIAGNOSIS — Z7982 Long term (current) use of aspirin: Secondary | ICD-10-CM | POA: Insufficient documentation

## 2017-12-17 DIAGNOSIS — Z7901 Long term (current) use of anticoagulants: Secondary | ICD-10-CM | POA: Insufficient documentation

## 2017-12-17 DIAGNOSIS — Z8673 Personal history of transient ischemic attack (TIA), and cerebral infarction without residual deficits: Secondary | ICD-10-CM | POA: Insufficient documentation

## 2017-12-17 DIAGNOSIS — I63443 Cerebral infarction due to embolism of bilateral cerebellar arteries: Secondary | ICD-10-CM

## 2017-12-17 MED ORDER — SIMVASTATIN 40 MG PO TABS: 40.0000 mg | ORAL_TABLET | Freq: Every day | ORAL | 3 refills | Status: DC

## 2017-12-17 MED ORDER — ASPIRIN EC 325 MG PO TBEC: 325.0000 mg | DELAYED_RELEASE_TABLET | Freq: Every day | ORAL | 0 refills | Status: DC

## 2017-12-17 NOTE — Telephone Encounter (Signed)
RN call patients daughter Thomas [name] that zocor 40mg  was sent to the pharmacy via computer. PTs crestor was not on the preferred list of drugs,and was over 90 dollars per month for 30 days supply. Rn explain to daughter the zocor should be ready for pick up tomorrow at [facility]. The daughter verbalized understanding.of stopping the crestor, and her father start taking zocor.

## 2017-12-17 NOTE — Progress Notes (Addendum)
Appreciate consult from Dr. Erlinda Hong, patient's neurologist managing his case. NP Vanschaick reached out to me this morning. Informed of plan to DC Coumadin and continue with ASA 325 mg daily. I have informed the patient of this and he is amenable to the plan.

## 2017-12-17 NOTE — Telephone Encounter (Signed)
Rn call patients daughter Thomas [name] that he is to discontinue the coumadin, and start aspirin 325mg .The pts daughter stated she Is aware of discontinue the coumadin, and only take aspirin 325mg  daily. The daughter stated her father does have 325 aspirin at home,and verbalized understanding.

## 2017-12-17 NOTE — Telephone Encounter (Signed)
Rn call Pensions consultant at [employer]. Rn stated no letter was sent to [place] that his insurance did not cover crestor. Annie Main stated crestor is not a preferred statin on pts drug plan. With his insurance plan pt will be charge $95.00 per month for crestor. Annie Main stated the preferred statin drugs on pts insurance plan are the following:  1.Lipitor 20mg  2. Zocor 40mg  generic 3. Pravastatin 80mg  generic  Rn stated a message will be sent to  [facility] NP. Annie Main verbalized understanding.

## 2017-12-17 NOTE — Telephone Encounter (Signed)
Thomas [name] is in clinic this morning at 0900. To be clear, I am instructing patient to stop Coumadin and commence with ASA 325 mg daily?

## 2017-12-17 NOTE — Telephone Encounter (Signed)
Crestor discontinued and placed order for zocor 40mg  daily. Please notify patient. Thank you.

## 2017-12-17 NOTE — Telephone Encounter (Signed)
Of note, patient is to be taking rosuvastatin 10 mg daily but his insurance company will not cover this.  Janett Billow,   His insurance company covers atorvastatin. Would you consider atorvastatin for this patient?   Thanks,   [employer]

## 2017-12-17 NOTE — Addendum Note (Signed)
Addended by: Venancio Poisson on: 12/17/2017 04:43 PM   Modules accepted: Orders

## 2017-12-17 NOTE — Addendum Note (Signed)
Addended by: Venancio Poisson on: 12/17/2017 07:30 AM   Modules accepted: Orders

## 2017-12-17 NOTE — Telephone Encounter (Signed)
D/c'd coumadin and increased aspirin dose from 81mg  to 325mg .

## 2017-12-17 NOTE — Telephone Encounter (Signed)
[phone] Number for Thomas [name] pharmacist at [employer].

## 2017-12-18 MED FILL — SIMVASTATIN 40 MG TABLET: 40 | 30 days supply | Qty: 30 | Fill #0

## 2017-12-31 ENCOUNTER — Telehealth: Payer: Self-pay

## 2017-12-31 MED ORDER — OMEPRAZOLE 20 MG PO CPDR: 20.0000 mg | DELAYED_RELEASE_CAPSULE | Freq: Every day | ORAL | 3 refills | Status: DC

## 2017-12-31 NOTE — Telephone Encounter (Signed)
Switched to omeprazole.  Please notify the patient

## 2017-12-31 NOTE — Telephone Encounter (Signed)
Pharmacy request if PCP can order patient Omeprazole 20MG  or Lansoprazole 30MG  due to Pantoprazole 40MG  is too expensive for patient with their insurance.

## 2018-01-01 MED FILL — OMEPRAZOLE 20 MG CAP: 20 | 30 days supply | Qty: 30 | Fill #0

## 2018-01-01 NOTE — Telephone Encounter (Signed)
CMA attempt to reach patient to inform his medication has been switch and sent to the [facility] pharmacy.  No answer and left a VM for patient.

## 2018-01-07 ENCOUNTER — Encounter: Payer: Self-pay | Admitting: Nurse Practitioner

## 2018-01-07 ENCOUNTER — Ambulatory Visit: Payer: Private Health Insurance - Indemnity | Attending: Nurse Practitioner | Admitting: Nurse Practitioner

## 2018-01-07 VITALS — BP 148/98 | HR 56 | Temp 97.8°F | Resp 16 | Wt 135.2 lb

## 2018-01-07 DIAGNOSIS — Z7982 Long term (current) use of aspirin: Secondary | ICD-10-CM | POA: Insufficient documentation

## 2018-01-07 DIAGNOSIS — N183 Chronic kidney disease, stage 3 (moderate): Secondary | ICD-10-CM | POA: Insufficient documentation

## 2018-01-07 DIAGNOSIS — K5909 Other constipation: Secondary | ICD-10-CM | POA: Insufficient documentation

## 2018-01-07 DIAGNOSIS — E1122 Type 2 diabetes mellitus with diabetic chronic kidney disease: Secondary | ICD-10-CM | POA: Insufficient documentation

## 2018-01-07 DIAGNOSIS — Z86718 Personal history of other venous thrombosis and embolism: Secondary | ICD-10-CM | POA: Insufficient documentation

## 2018-01-07 DIAGNOSIS — E1140 Type 2 diabetes mellitus with diabetic neuropathy, unspecified: Secondary | ICD-10-CM | POA: Insufficient documentation

## 2018-01-07 DIAGNOSIS — Z95828 Presence of other vascular implants and grafts: Secondary | ICD-10-CM | POA: Insufficient documentation

## 2018-01-07 DIAGNOSIS — I10 Essential (primary) hypertension: Secondary | ICD-10-CM

## 2018-01-07 DIAGNOSIS — G629 Polyneuropathy, unspecified: Secondary | ICD-10-CM

## 2018-01-07 DIAGNOSIS — Z79899 Other long term (current) drug therapy: Secondary | ICD-10-CM | POA: Insufficient documentation

## 2018-01-07 DIAGNOSIS — I129 Hypertensive chronic kidney disease with stage 1 through stage 4 chronic kidney disease, or unspecified chronic kidney disease: Secondary | ICD-10-CM | POA: Insufficient documentation

## 2018-01-07 DIAGNOSIS — Z8673 Personal history of transient ischemic attack (TIA), and cerebral infarction without residual deficits: Secondary | ICD-10-CM | POA: Insufficient documentation

## 2018-01-07 MED ORDER — LOSARTAN POTASSIUM 50 MG PO TABS: 50.0000 mg | ORAL_TABLET | Freq: Every day | ORAL | 3 refills | Status: DC

## 2018-01-07 MED ORDER — GABAPENTIN 300 MG PO CAPS: 300.0000 mg | ORAL_CAPSULE | Freq: Every day | ORAL | 1 refills | Status: DC

## 2018-01-07 MED ORDER — SENNOSIDES-DOCUSATE SODIUM 8.6-50 MG PO TABS: 1.0000 | ORAL_TABLET | Freq: Two times a day (BID) | ORAL | 0 refills | Status: AC

## 2018-01-07 NOTE — Progress Notes (Signed)
Assessment & Plan:  Diagnoses and all orders for this visit:  Essential hypertension -     losartan (COZAAR) 50 MG tablet; Take 1 tablet (50 mg total) by mouth daily. Continue all antihypertensives as prescribed.  Remember to bring in your blood pressure log with you for your follow up appointment.  DASH/Mediterranean Diets are healthier choices for HTN.   Chronic constipation -     senna-docusate (SENOKOT-S) 8.6-50 MG tablet; Take 1 tablet by mouth 2 (two) times daily.  Neuropathy -     gabapentin (NEURONTIN) 300 MG capsule; Take 1 capsule (300 mg total) by mouth at bedtime.    Patient has been counseled on age-appropriate routine health concerns for screening and prevention. These are reviewed and up-to-date. Referrals have been placed accordingly. Immunizations are up-to-date or declined.    Subjective:   HPI Thomas [name] 63 y.o. male presents to office today for follow up. 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 As of 12-17-2017 coumadin was stopped per Neurology Dr. Erlinda Hong and patient is currently taking ASA 325mg  daily.  GOAL: Stroke risk factor modification: Maintain blood pressure goal <130/80,  Diabetes with Hgb A1c goal below 7.0%  Lipids with LDL cholesterol goal below 70 mg/dL.  - check BP at home and record He is accompanied by his daughter today.   ESSENTIAL HYPERTENSION He took an antihypertensive for HTN over 10 years ago but reportedly had been taken off due to LOW BP readings. His blood pressure was normal at his last office visit with me several months ago but since then has been increased and he reports readings at home have been 884-166A systolic. Denies chest pain, shortness of breath, palpitations, lightheadedness, dizziness, headaches or BLE edema. He can not recall the name of the antihypertensive he  took in the past. Will try losartan 50mg  and have him return for BP recheck and labs for Cr+. If Cr+ increased, will need to switch to amlodipine.  BP Readings from Last 3 Encounters:  01/07/18 (!) 148/98  11/08/17 138/87  10/26/17 115/70   Constipation Patient complains of constipation.  Stool pattern has been  firm and pellet like stool(s) a few times per week. Constipation is chronic in nature. Defecation has been difficult. Co-Morbid conditions:none. Symptoms have been symptoms have progressed to a point and plateaued. Current Health Habits: Eating fiber? Not large amounts Exercise?no Water intake? Not sufficient.  Current OTC/RX therapy has been none.    Neuropathy: He describes symptoms of numbness, burning and tingling in his bilateral feet. His neuropathy is chronic. Symptoms are currently of moderate and intermittent severity. Symptoms occur intermittently and throughout the day and last hours. The patient denies lancinating pain, cramping and squeezing. Symptoms are symmetric. He also describes autonomic symptoms of  Constipation with early satiety. He denies  diarrhea. Previous treatment has included gabapentin 100 mg nightly, which has not improved symptoms.   Review of Systems  Constitutional: Negative for fever, malaise/fatigue and weight loss.  HENT: Negative.  Negative for nosebleeds.   Eyes: Negative.  Negative for blurred vision, double vision and photophobia.  Respiratory: Negative.  Negative for cough and shortness of breath.   Cardiovascular: Negative.  Negative for chest pain, palpitations and leg swelling.  Gastrointestinal: Positive for constipation. Negative for heartburn, nausea and vomiting.  Musculoskeletal: Negative.  Negative for myalgias.  Neurological: Positive for sensory change (peripheral neuropathy) and  weakness. Negative for dizziness, focal weakness, seizures and headaches.  Psychiatric/Behavioral: Negative.  Negative for suicidal ideas.    Past Medical  History:  Diagnosis Date  . Hypertension   . Stroke [facility])     Past Surgical History:  Procedure Laterality Date  . ENDOVASCULAR STENT INSERTION N/A 07/01/2017   Procedure: ENDOVASCULAR STENT GRAFT INSERTION;  Surgeon: Serafina Mitchell, MD;  Location: [facility] OR;  Service: Vascular;  Laterality: N/A;  . THROMBECTOMY FEMORAL ARTERY Bilateral 07/01/2017   Procedure: Bilateral IlioFemoral Thrombectomies;  Surgeon: Serafina Mitchell, MD;  Location: [facility] OR;  Service: Vascular;  Laterality: Bilateral;    No family history on file.  Social History Reviewed with no changes to be made today.   Outpatient Medications Prior to Visit  Medication Sig Dispense Refill  . acetaminophen (TYLENOL) 500 MG tablet Take 500 mg by mouth every 6 (six) hours as needed (for pain or headaches).    Marland Kitchen aspirin EC 325 MG tablet Take 1 tablet (325 mg total) by mouth daily. 30 tablet 0  . omeprazole (PRILOSEC) 20 MG capsule Take 1 capsule (20 mg total) by mouth daily. 30 capsule 3  . simvastatin (ZOCOR) 40 MG tablet Take 1 tablet (40 mg total) by mouth daily. 30 tablet 3  . gabapentin (NEURONTIN) 100 MG capsule Take 1 capsule (100 mg total) by mouth at bedtime. 30 capsule 3   No facility-administered medications prior to visit.     No Known Allergies     Objective:    BP (!) 148/98 (BP Location: Right Arm, Patient Position: Sitting, Cuff Size: Normal)   Pulse (!) 56   Temp 97.8 F (36.6 C) (Oral)   Resp 16   Wt 135 lb 3.2 oz (61.3 kg)   SpO2 98%   BMI 21.18 kg/m  Wt Readings from Last 3 Encounters:  01/07/18 135 lb 3.2 oz (61.3 kg)  11/08/17 132 lb (59.9 kg)  10/02/17 130 lb 6.4 oz (59.1 kg)    Physical Exam  Constitutional: He is oriented to person, place, and time. He appears well-developed and well-nourished. He is cooperative.  HENT:  Head: Normocephalic and atraumatic.  Eyes: EOM are normal.  Neck: Normal range of motion.  Cardiovascular: Normal rate, regular rhythm, normal heart sounds and intact  distal pulses. Exam reveals no gallop and no friction rub.  No murmur heard. Pulmonary/Chest: Effort normal and breath sounds normal. No tachypnea. No respiratory distress. He has no decreased breath sounds. He has no wheezes. He has no rhonchi. He has no rales. He exhibits no tenderness.  Abdominal: Soft. Bowel sounds are normal. He exhibits no distension and no mass. There is no tenderness. There is no rebound and no guarding. No hernia.  Musculoskeletal: Normal range of motion. He exhibits no edema, tenderness or deformity.  Neurological: He is alert and oriented to person, place, and time. Gait (using cane for ambulation assistance) abnormal. Coordination normal.  Skin: Skin is warm and dry.  Psychiatric: He has a normal mood and affect. His behavior is normal. Judgment and thought content normal.  Nursing note and vitals reviewed.       Patient has been counseled extensively about nutrition and exercise as well as the importance of adherence with medications and regular follow-up. The patient was given clear instructions to go to ER or return to medical center if symptoms don't improve, worsen or new problems develop. The patient verbalized understanding.   Follow-up: Return in about 3 weeks (around 01/28/2018) for BP recheck and labs  for ARB.   Gildardo Pounds, FNP-BC [facility] and [facility] [place], [place]   01/07/2018, 12:07 PM

## 2018-01-07 NOTE — Patient Instructions (Signed)
Peripheral Neuropathy Peripheral neuropathy is a type of nerve damage. It affects nerves that carry signals between the spinal cord and other parts of the body. These are called peripheral nerves. With peripheral neuropathy, one nerve or a group of nerves may be damaged. What are the causes? Many things can damage peripheral nerves. For some people with peripheral neuropathy, the cause is unknown. Some causes include:  Diabetes. This is the most common cause of peripheral neuropathy.  Injury to a nerve.  Pressure or stress on a nerve that lasts a long time.  Too little vitamin B. Alcoholism can lead to this.  Infections.  Autoimmune diseases, such as multiple sclerosis and systemic lupus erythematosus.  Inherited nerve diseases.  Some medicines, such as cancer drugs.  Toxic substances, such as lead and mercury.  Too little blood flowing to the legs.  Kidney disease.  Thyroid disease.  What are the signs or symptoms? Different people have different symptoms. The symptoms you have will depend on which of your nerves is damaged. Common symptoms include:  Loss of feeling (numbness) in the feet and hands.  Tingling in the feet and hands.  Pain that burns.  Very sensitive skin.  Weakness.  Not being able to move a part of the body (paralysis).  Muscle twitching.  Clumsiness or poor coordination.  Loss of balance.  Not being able to control your bladder.  Feeling dizzy.  Sexual problems.  How is this diagnosed? Peripheral neuropathy is a symptom, not a disease. Finding the cause of peripheral neuropathy can be hard. To figure that out, your health care provider will take a medical history and do a physical exam. A neurological exam will also be done. This involves checking things affected by your brain, spinal cord, and nerves (nervous system). For example, your health care provider will check your reflexes, how you move, and what you can feel. Other types of tests  may also be ordered, such as:  Blood tests.  A test of the fluid in your spinal cord.  Imaging tests, such as CT scans or an MRI.  Electromyography (EMG). This test checks the nerves that control muscles.  Nerve conduction velocity tests. These tests check how fast messages pass through your nerves.  Nerve biopsy. A small piece of nerve is removed. It is then checked under a microscope.  How is this treated?  Medicine is often used to treat peripheral neuropathy. Medicines may include: ? Pain-relieving medicines. Prescription or over-the-counter medicine may be suggested. ? Antiseizure medicine. This may be used for pain. ? Antidepressants. These also may help ease pain from neuropathy. ? Lidocaine. This is a numbing medicine. You might wear a patch or be given a shot. ? Mexiletine. This medicine is typically used to help control irregular heart rhythms.  Surgery. Surgery may be needed to relieve pressure on a nerve or to destroy a nerve that is causing pain.  Physical therapy to help movement.  Assistive devices to help movement. Follow these instructions at home:  Only take over-the-counter or prescription medicines as directed by your health care provider. Follow the instructions carefully for any given medicines. Do not take any other medicines without first getting approval from your health care provider.  If you have diabetes, work closely with your health care provider to keep your blood sugar under control.  If you have numbness in your feet: ? Check every day for signs of injury or infection. Watch for redness, warmth, and swelling. ? Wear padded socks and comfortable   shoes. These help protect your feet.  Do not do things that put pressure on your damaged nerve.  Do not smoke. Smoking keeps blood from getting to damaged nerves.  Avoid or limit alcohol. Too much alcohol can cause a lack of B vitamins. These vitamins are needed for healthy nerves.  Develop a good  support system. Coping with peripheral neuropathy can be stressful. Talk to a mental health specialist or join a support group if you are struggling.  Follow up with your health care provider as directed. Contact a health care provider if:  You have new signs or symptoms of peripheral neuropathy.  You are struggling emotionally from dealing with peripheral neuropathy.  You have a fever. Get help right away if:  You have an injury or infection that is not healing.  You feel very dizzy or begin vomiting.  You have chest pain.  You have trouble breathing. This information is not intended to replace advice given to you by your health care provider. Make sure you discuss any questions you have with your health care provider. Document Released: 03/03/2002 Document Revised: 08/19/2015 Document Reviewed: 11/18/2012 Elsevier Interactive Patient Education  2017 [employer]  

## 2018-01-07 NOTE — Progress Notes (Signed)
No appetite.  Denies N/V Stops eating x 3-4 days but supplements with water, ensure, and soy milk.

## 2018-01-23 MED FILL — GABAPENTIN 300 MG CAPSULE: 300 | 30 days supply | Qty: 30 | Fill #0

## 2018-01-23 MED FILL — SIMVASTATIN 40 MG TABLET: 40 | 30 days supply | Qty: 30 | Fill #1

## 2018-01-23 MED FILL — LOSARTAN POTASSIUM 50 MG TA: 50 | 30 days supply | Qty: 30 | Fill #0

## 2018-02-05 MED FILL — OMEPRAZOLE 20 MG CAP: 20 | 30 days supply | Qty: 30 | Fill #1

## 2018-02-12 ENCOUNTER — Encounter: Payer: Self-pay | Admitting: Adult Health

## 2018-02-12 ENCOUNTER — Ambulatory Visit: Payer: Medicaid Other | Admitting: Adult Health

## 2018-02-12 VITALS — BP 110/68 | HR 67 | Wt 139.6 lb

## 2018-02-12 DIAGNOSIS — Z9889 Other specified postprocedural states: Secondary | ICD-10-CM | POA: Diagnosis not present

## 2018-02-12 DIAGNOSIS — I63431 Cerebral infarction due to embolism of right posterior cerebral artery: Secondary | ICD-10-CM | POA: Diagnosis not present

## 2018-02-12 DIAGNOSIS — E785 Hyperlipidemia, unspecified: Secondary | ICD-10-CM

## 2018-02-12 DIAGNOSIS — Z8679 Personal history of other diseases of the circulatory system: Secondary | ICD-10-CM

## 2018-02-12 DIAGNOSIS — I10 Essential (primary) hypertension: Secondary | ICD-10-CM

## 2018-02-12 DIAGNOSIS — M79604 Pain in right leg: Secondary | ICD-10-CM

## 2018-02-12 DIAGNOSIS — G629 Polyneuropathy, unspecified: Secondary | ICD-10-CM | POA: Diagnosis not present

## 2018-02-12 MED ORDER — GABAPENTIN 300 MG PO CAPS: 300.0000 mg | ORAL_CAPSULE | Freq: Two times a day (BID) | ORAL | 2 refills | Status: DC

## 2018-02-12 NOTE — Progress Notes (Signed)
I agree with the above plan 

## 2018-02-12 NOTE — Progress Notes (Signed)
STROKE NEUROLOGY FOLLOW UP NOTE  NAME: Burak Zerbe DOB: September 13, 1955  REASON FOR VISIT: stroke follow up HISTORY FROM: pt and chart  Chief Complaint  Patient presents with  . Follow-up    Stroke follow up room in back hallway pt with Jeanett Schlein his daughter has cane for DME mobility      History Summary Mr. Khaleef Ruby is a 62 y.o. male with history of hypertension, history of tobacco use and previous strokes by imaging presenting with weakness, chills, dizziness, cough, left-sided chest pain, abdominal pain, nausea and vomiting.   CTA aortic artery showed ruptured AAA. S/p emergent endovascular repair of ruptured AAA, bilateral iliofemoral endarterectomy, and BLE thrumboembolectomies.  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  Symptoms consistent with PVD, no further surgery performed at that time.  Patient numbness and weakness on the right LE gradually improved over time.  Due to dizziness, chills, nausea vomiting, patient had MRI with and without contrast showed 3 acute small left cerebellar infarcts, old small bilateral cerebellar infarcts as well as old BG and thalamus infarcts.  MRA head and neck revealed right M1 chronic occlusion, bilateral PCA stenosis in the terminal right VA stenosis, no mycotic aneurysm.  EF 60 to 65%.  LDL 72 and A1c 5.6.  Initially plan for TEE to rule out endocarditis, however, patient deemed high risk, then started empiric treatment of Rocephin for 6 weeks.  After anemia stabilized, patient started on aspirin and Lipitor.  Discharging stable condition, recommend quit smoking.  08/08/17 visit JX: During the interval time, the patient has been doing well from a stroke standpoint. Patient right LE still has intermittent numbness, but brief and mild.  He still has left lower quadrant abdominal pain likely due to left retroperitoneal hematoma.   However, repeat CT abdomen pelvis on 07/08/2017 showed decreasing size of hematoma.  He has appointment with [facility] on 08/13/2017 for repeat CT angios abdomen and pelvis.  He is going to finish 6 weeks of antibiotics this Sunday.  BP today 150/92 in clinic. At this appointment, patient stated that he had intermittent vertigo episodes since 2014.  It happened suddenly, most time while he was in sitting position, either sitting a desk or driving in a car.  Room spinning sensation, lasting 1 to 2 hours with nausea vomiting.  No headache, or weakness numbness.  Also has left ear ringing initially, but then about 3 to 4 years ago left ear severe hearing loss.  Patient had ENT referral several years ago, recommend MRI, however patient has no insurance at that time and he did not follow-up after.  Interval history 11/08/17:  Since previous visit, patient was seen in ED on 08/16/2017 with complaints of generalized weakness and right leg ataxia.  MRI head showed bilateral cerebellar and right lateral medullary infarcts.  CTA head and neck showed chronic right M1 occlusion and right VA origin stenosis.  Echocardiogram showed normal EF of 65 to 70%.  CTA chest showed 4.5 ascending thoracic aortic aneurysm with soft plaque.  Due to this finding, patient was started on warfarin with INR goal of 2-3 and also recommended to continue aspirin and atorvastatin.  Dr. Erlinda Hong felt that stroke was embolic due to extensive soft plaque.  It was recommended to repeat CTA aorta 3 months post discharge to decide further course of anticoagulation or antiplatelet.  PT/OT recommended CIR for continued deficits.   Patient  has since returned home where he currently lives with his daughter.  He continues to have residual right-sided weakness but does complain of muscle aches and pains.  Patient is not undergoing therapies at this time but does state he does his own exercises to help strengthen his right leg.  He continues to take aspirin and Coumadin and  does state he gets occasional nosebleeds approximately every 2 to 3 days with a minimal amount of blood that last approximately 5 minutes and will stop on their own.  Recent INR 4.3 on 11/05/2017.  Patient does go to Coumadin clinic for management and after medication adjustments were made they will repeat INR on 11/19/2017.  Patient continues to take atorvastatin with possible side effects of myalgias.  Blood pressure today 138/87.  Denies new or worsening stroke/TIA symptoms.  Interval history 02/12/2018: Patient is being seen today for follow-up visit and is accompanied by his daughter. It was recommended for patient to undergo repeat CTA chest to assess for resolving aortic thrombus but due to difficulties with scheduling patient, on 12/17/2017 it was recommended by Dr. Erlinda Hong to discontinue Coumadin and continue taking aspirin 325 mg daily as after hospital discharge on 11/08/2017, is recommended to repeat CTA at 3 months time and decide if further anticoagulation was needed.  Patient has continued on aspirin 305 mg daily without side effects of bleeding or bruising.  After prior visit, it was recommended to discontinue atorvastatin due to possible myalgias and initiate Crestor but due to lack of insurance covering that particular medication, he was started on simvastatin.  He continues to complain of same right lower extremity calf pain which has been present since 02/2017.  He also continues to have bilateral feet and left leg neuropathy pain.  He was seen by vascular surgery who does not believe RLE pain vascular related but possibly residual deficit from AAA rupture.  He states that this pain limits his activities and ambulation.  He denies pain while sitting at rest but after ambulation, he will start to experience calf pain and after sitting this pain will resolve.  No further concerns at this time.  Denies new or worsening stroke/TIA symptoms.    REVIEW OF SYSTEMS: Full 14 system review of systems  performed and notable only for those listed below and in HPI above, all others are negative:  Frequent waking, aching muscles, walking difficulty and neck stiffness  The following represents the patient's updated allergies and side effects list: No Known Allergies  The neurologically relevant items on the patient's problem list were reviewed on today's visit.  Neurologic Examination  A problem focused neurological exam (12 or more points of the single system neurologic examination, vital signs counts as 1 point, cranial nerves count for 8 points) was performed.  Blood pressure 110/68, pulse 67, weight 139 lb 9.6 oz (63.3 kg).  General - Well nourished, well developed, pleasant middle-aged male, in no apparent distress.  Ophthalmologic - Fundi not visualized due to noncooperation.  Cardiovascular - Regular rate and rhythm.  Mental Status -  Level of arousal and orientation to time, place, and person were intact. Language including expression, naming, repetition, comprehension was assessed and found intact. Attention span and concentration were normal. Fund of Knowledge was assessed and was intact.  Cranial Nerves II - XII - II - Visual field intact OU. III, IV, VI - Extraocular movements intact. V - Facial sensation intact bilaterally. VII - Facial movement intact bilaterally. VIII -subjective left ear hearing loss X - Palate  elevates symmetrically. XI - Chin turning & shoulder shrug intact bilaterally. XII - Tongue protrusion intact.  Motor Strength - The patient's strength was normal in all extremities bulk was normal and fasciculations were absent.   Motor Tone - Muscle tone was assessed at the neck and appendages and was normal.  Reflexes - The patient's reflexes were 2+ in BLE and 1+ BUE  Sensory - Light touch, temperature/pinprick, vibration and proprioception, and Romberg testing were assessed and were normal.    Coordination - The patient had normal movements in the  hands and feet with no ataxia or dysmetria.  Tremor was absent.  Gait and Station - The patient's transfers, posture, gait, station, and turns were observed and does cane with evidence of favoring right leg     Data reviewed: I personally reviewed the images and agree with the radiology interpretations.  CT head without contrast 08/15/2017 IMPRESSION: Old bilateral basal ganglia lacunar infarcts and sequelae of chronic small vessel disease without acute intracranial abnormality.  MR brain without contrast 08/16/2017 IMPRESSION: 1. Acute subcentimeter infarcts bilateral cerebella and RIGHT cervicomedullary junction. 2. Slow flow versus occluded RIGHT vertebral artery. 3. 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 Old small cerebellar infarcts.  CT a head/neck with and without contrast 08/16/2017 IMPRESSION: 1. No new emergent large vessel occlusion. 2. 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 3. Unchanged severe stenosis or occlusion of the origin of the right vertebral artery, which is otherwise patent to the skull base. 4. No hemodynamically significant stenosis of the carotid arteries. 5. Bulky noncalcified plaque in the aortic arch with moderate stenosis of the proximal left subclavian artery. 6. Dilated right laryngeal ventricle and piriform sinus suggesting right-sided vocal cord paralysis.  CTA chest aorta 08/17/2017 IMPRESSION: 1. 4.5 cm ascending thoracic aortic aneurysm with soft plaque as above described, some which is minimally ulcerated along the posterior aspect of the distal ascending aorta. No dissection. Recommend semi-annual imaging followup by CTA or MRA and referral to cardiothoracic surgery if not already obtained. This recommendation follows 2010 ACCF/AHA/AATS/ACR/ASA/SCA/SCAI/SIR/STS/SVM Guidelines for the Diagnosis and Management of Patients With Thoracic Aortic Disease. Circulation. 2010; 121: I696-E952 2.  Mild cardiomegaly without pericardial effusion or thickening. 3. No acute pulmonary embolus. 4. Bilateral centrilobular and paraseptal emphysema with dependent bibasilar atelectasis. 5. Indeterminate left adrenal nodule measuring 1.6 x 1 cm. This could be further correlated with dedicated CT or MRI. 6. Uncomplicated cholelithiasis. 7. Bilateral renal cysts.   Assessment: Tyvion Edmondson is a 62 year old with bilateral cerebellar and right lateral medullary small infarcts on 08/15/2017 due to a thoracic aortic aneurysm with soft plaque.  Coumadin was initiated at that time.  Vascular risk factors include HTN, HLD, AAA rupture s/p emergent endovascular repair, bilateral iliofemoral enterectomy and BLE thromboembolectomy.  Patient is being seen today for follow-up visit and does have continued complaints of RLE pain and bilateral lower extremity neuropathy.   Plan:  - continue ASA 325 mg and Zocor for stroke prevention and peripheral vascular disease  - follow up with vascular surgery and cardiology as scheduled -Recommended increasing gabapentin dose due to continued neuropathy -advised to take 300 mg twice daily -Possible consideration of EMG/nerve conduction study in the future for continued RLE pain.  Did advise patient that it is difficult to determine if this is a residual deficit from his AAA rupture or a different comorbidity.  Did reiterate that [facility] does not believe it is vascular related.  Patient will call office if he would  like to pursue further testing -Advised to increase activity as tolerated and maintain a healthy diet -Continue to monitor blood pressure at home - Follow up with your primary care physician for stroke risk factor modification. Recommend maintain blood pressure goal <130/80, diabetes with hemoglobin A1c goal below 7.0% and lipids with LDL cholesterol goal below 70 mg/dL.  - check BP at home and record.   Advised to follow-up in 6 months or call earlier if needed  I  spent more than 25 minutes of face to face time with the patient. Greater than 50% of time was spent in counseling and coordination of care. We discussed follow up with [facility], and cardiology, refer to ENT for meniere's disease.  Venancio Poisson, AGNP-BC  [facility] [address] [place], [place] [address]  Phone [phone] Fax [phone] Note: This document was prepared with digital dictation and possible smart phrase technology. Any transcriptional errors that result from this process are unintentional.

## 2018-02-12 NOTE — Patient Instructions (Addendum)
Continue aspirin 325 mg daily  and Zocor  for secondary stroke prevention  Continue to follow up with PCP regarding cholesterol and blood pressure management   Increase gabapentin - start taking 1 capsule in the morning and 1 capsule at bedtime. If this makes you too tired during the day, please let us know and we can combine the 2 capsules at night. If you are tolerating well but continue to experience nerve pain after 2 weeks, please call office and we can consider increasing   Due to continue bilateral leg pain, consider having EMG/nerve conduction study done. Please call office if you like to pursue this testing. If so, we will do lower extremity ultrasound first to ensure you have no clots prior to testing.   Continue to stay active and maintain a healthy diet  Continue to monitor blood pressure at home  Maintain strict control of hypertension with blood pressure goal below 130/90, diabetes with hemoglobin A1c goal below 6.5% and cholesterol with LDL cholesterol (bad cholesterol) goal below 70 mg/dL. I also advised the patient to eat a healthy diet with plenty of whole grains, cereals, fruits and vegetables, exercise regularly and maintain ideal body weight.  Followup in the future with me in 6 months or call earlier if needed       Thank you for coming to see [place] at [facility]. I hope we have been able to provide you high quality care today.  You may receive a patient satisfaction survey over the next few weeks. We would appreciate your feedback and comments so that we may continue to improve ourselves and the health of our patients.

## 2018-02-13 MED FILL — GABAPENTIN 300 MG CAPSULE: 300 | 30 days supply | Qty: 60 | Fill #0

## 2018-02-19 MED FILL — SIMVASTATIN 40 MG TABLET: 40 | 30 days supply | Qty: 30 | Fill #2

## 2018-02-19 MED FILL — LOSARTAN POTASSIUM 50 MG TA: 50 | 30 days supply | Qty: 30 | Fill #1

## 2018-03-06 ENCOUNTER — Other Ambulatory Visit: Payer: Self-pay | Admitting: Thoracic Surgery (Cardiothoracic Vascular Surgery)

## 2018-03-06 DIAGNOSIS — I712 Thoracic aortic aneurysm, without rupture, unspecified: Secondary | ICD-10-CM

## 2018-03-13 MED FILL — OMEPRAZOLE 20 MG CAP: 20 | 30 days supply | Qty: 30 | Fill #2

## 2018-03-18 MED FILL — LOSARTAN POTASSIUM 50 MG TA: 50 | 30 days supply | Qty: 30 | Fill #2

## 2018-03-18 MED FILL — SIMVASTATIN 40 MG TABLET: 40 | 30 days supply | Qty: 30 | Fill #3

## 2018-04-05 ENCOUNTER — Telehealth (HOSPITAL_COMMUNITY): Payer: Self-pay | Admitting: *Deleted

## 2018-04-05 NOTE — Telephone Encounter (Signed)
04/05/18 attempted to reach pt to confirm appts [facility]

## 2018-04-08 ENCOUNTER — Ambulatory Visit: Payer: Private Health Insurance - Indemnity | Admitting: Surgery

## 2018-04-08 ENCOUNTER — Other Ambulatory Visit (HOSPITAL_COMMUNITY): Payer: Private Health Insurance - Indemnity

## 2018-04-08 ENCOUNTER — Encounter: Payer: Self-pay | Admitting: Surgery

## 2018-04-08 MED FILL — GABAPENTIN 300 MG CAPSULE: 300 | 30 days supply | Qty: 60 | Fill #1

## 2018-04-08 MED FILL — OMEPRAZOLE 20 MG CAP: 20 | 30 days supply | Qty: 30 | Fill #3

## 2018-04-11 ENCOUNTER — Ambulatory Visit
Admission: RE | Admit: 2018-04-11 | Discharge: 2018-04-11 | Disposition: A | Discharge status: Home / Self Care | Payer: Private Health Insurance - Indemnity | Source: Ambulatory Visit | Attending: Thoracic Surgery (Cardiothoracic Vascular Surgery) | Admitting: Thoracic Surgery (Cardiothoracic Vascular Surgery)

## 2018-04-11 DIAGNOSIS — I712 Thoracic aortic aneurysm, without rupture, unspecified: Secondary | ICD-10-CM

## 2018-04-11 MED ORDER — IOPAMIDOL (ISOVUE-370) INJECTION 76%
75.0000 mL | Freq: Once | INTRAVENOUS | Status: AC | PRN
  Administered 2018-04-11: 50 mL via INTRAVENOUS

## 2018-04-16 ENCOUNTER — Ambulatory Visit: Payer: Private Health Insurance - Indemnity | Admitting: Thoracic Surgery (Cardiothoracic Vascular Surgery)

## 2018-04-22 ENCOUNTER — Other Ambulatory Visit: Payer: Self-pay | Admitting: Adult Health

## 2018-04-22 MED FILL — LOSARTAN POTASSIUM 50 MG TA: 50 | 30 days supply | Qty: 30 | Fill #3

## 2018-04-23 MED FILL — SIMVASTATIN 40 MG TABLET: 40 | 30 days supply | Qty: 30 | Fill #0

## 2018-05-07 ENCOUNTER — Ambulatory Visit: Payer: Medicaid Other | Admitting: Thoracic Surgery (Cardiothoracic Vascular Surgery)

## 2018-05-07 VITALS — BP 110/72 | HR 64 | Resp 18 | Ht 67.0 in | Wt 137.0 lb

## 2018-05-07 DIAGNOSIS — I712 Thoracic aortic aneurysm, without rupture, unspecified: Secondary | ICD-10-CM

## 2018-05-07 NOTE — Progress Notes (Signed)
[address]       [place] [address]             [phone]       HPI: Thomas [name] returns for scheduled follow-up visit regarding his ascending aneurysm and arch atherosclerosis  Thomas [name] is a 63 year old man with a past history of former tobacco abuse, hypertension, multiple cerebellar strokes, and a ruptured abdominal aortic aneurysm.  Back in April 2019 he was admitted with fever, chills, chest pain, and cough.  He also had dizziness.  An MRI of the brain showed multiple acute cerebellar infarcts.  Blood cultures were positive for pneumococcus.  He developed acute abdominal pain and was found to have a ruptured 6.6 cm abdominal aortic aneurysm.  He had emergent repair by Dr. Trula Slade.  In May he again was found to have bilateral cerebellar infarcts.  There was moderate AI on echocardiogram but no vegetations were seen. A CT of the chest showed a 4.5 cm ascending aneurysm with extensive soft plaque in the arch and proximal descending aorta.  He continues to have difficulty walking.  He is using a cane instead of a walker but he can only walk very short distances.  He continues to have problems with his balance.  He continues to have severe leg pain.  He saw neurology about that back in November.  Unfortunately that has not improved.  His Coumadin was stopped and he remains on aspirin.  Past Medical History:  Diagnosis Date  . Hypertension   . Stroke [facility])     Current Outpatient Medications  Medication Sig Dispense Refill  . acetaminophen (TYLENOL) 500 MG tablet Take 500 mg by mouth every 6 (six) hours as needed (for pain or headaches).    Marland Kitchen aspirin EC 325 MG tablet Take 1 tablet (325 mg total) by mouth daily. 30 tablet 0  . gabapentin (NEURONTIN) 300 MG capsule Take 1 capsule (300 mg total) by mouth 2 (two) times daily. 60 capsule 2  . losartan (COZAAR) 50 MG tablet Take 1 tablet (50 mg total) by mouth daily. 90 tablet 3  . omeprazole (PRILOSEC) 20 MG capsule Take  1 capsule (20 mg total) by mouth daily. 30 capsule 3   No current facility-administered medications for this visit.     Physical Exam BP 110/72   Pulse 64   Resp 18   Ht 5\' 7"  (1.702 m)   Wt 137 lb (62.1 kg)   SpO2 96% Comment: RA  BMI 21.58 kg/m  63 year old man in no acute distress, appears older than stated age Alert and oriented, speech fluent, ambulates with cane, No carotid bruits Cardiac regular rate and rhythm, normal S1 and S2, no audible murmur on exam Lungs clear Poor dentition No peripheral edema  Diagnostic Tests: CT ANGIOGRAPHY CHEST WITH CONTRAST  TECHNIQUE: Multidetector CT imaging of the chest was performed using the standard protocol during bolus administration of intravenous contrast. Multiplanar CT image reconstructions and MIPs were obtained to evaluate the vascular anatomy.  CONTRAST:  59mL ISOVUE-370 IOPAMIDOL (ISOVUE-370) INJECTION 76%  Creatinine was obtained on site at [place] [facility] at [address]  Results: Creatinine 1.8 mg/dL.  COMPARISON:  Chest CT-08/17/2017; CT abdomen pelvis-07/01/2017  FINDINGS: Vascular Findings:  Grossly unchanged fusiform aneurysmal dilatation of the ascending thoracic aorta with measurements as follows. The thoracic aorta tapers to a normal caliber at the level of the aortic arch. Conventional configuration of the aortic arch. There is a large amount of  irregular predominantly noncalcified atherosclerotic plaque throughout the thoracic aorta, not definitely resulting in a hemodynamically significant stenosis. While there is irregularity of the predominantly noncalcified atherosclerotic plaque, there is no definitive evidence of dissection or periaortic stranding on this nongated examination.  Re demonstrated non flow limiting intraluminal intimal web involving the distal aspect of the aortic arch (sagittal image 104, series 12), unchanged to decreased in size compared to the  07/2017 examination.  Slight increase in size of contained penetrating atherosclerotic ulcer involving the caudal aspect of the distal aortic arch, currently measuring 0.9 x 0.6 cm (image 104, series 12), previously, 0.6 x 0.4 cm. No contrast extravasation or periaortic stranding.  Irregular noncalcified atherosclerotic plaque involves the origin and proximal aspect of the left subclavian artery, not definitely resulting in hemodynamically significant stenosis.  The descending thoracic aorta is of normal caliber.  Borderline cardiomegaly. No pericardial effusion. Coronary artery calcifications.  Although this examination was not tailored for the evaluation the pulmonary arteries, there are no discrete filling defects within the central pulmonary arterial tree to suggest central pulmonary embolism. Normal caliber of the main pulmonary artery.  -------------------------------------------------------------  Thoracic aortic measurements:  Sinotubular junction  34 mm as measured in greatest oblique coronal dimension.  Proximal ascending aorta  45 mm as measured in greatest oblique axial dimension at the level of the main pulmonary artery (image 77, series 5) and approximately 43 mm in greatest oblique short axis coronal diameter (coronal image 78, series 10), unchanged.  Aortic arch aorta  34 mm as measured in greatest oblique sagittal dimension.  Proximal descending thoracic aorta  28 mm as measured in greatest oblique axial dimension at the level of the main pulmonary artery.  Distal descending thoracic aorta  27 mm as measured in greatest oblique axial dimension at the level of the diaphragmatic hiatus.  Review of the MIP images confirms the above findings.  -------------------------------------------------------------  Non-Vascular Findings:  Mediastinum/Lymph Nodes: Scattered mediastinal lymph nodes are unchanged though remains borderline  enlarged with index precarinal lymph node measuring 1.4 cm in greatest short axis diameter (image 66, series 5), index AP window lymph node measuring 0.9 cm (image 65), index right suprahilar lymph node measuring 0.9 cm (image 67) and index left infrahilar lymph node measuring 0.8 cm (image 85). No worsening bulky mediastinal, hilar axillary lymphadenopathy.  Lungs/Pleura: Severe apical predominant predominantly centrilobular emphysematous change.  Subpleural reticulation is again seen about the bilateral lung bases as well as about the right major fissure and left costophrenic angle. Slight progression of heterogeneous/consolidative opacities within the subpleural aspect of the left lower lobe (representative image 107, series 7). No associated air bronchograms. No discrete areas of honeycombing.  Slightly nodular subsegmental atelectasis about the anterior aspect of the left fissure is unchanged compared to the 07/2017 examination.  Mild traction bronchiectasis within the basilar aspects of the bilateral lower lobes, left greater than right, is unchanged. Mild centralized bronchial wall thickening however the central pulmonary airways remain patent.  Presumed shrapnel within the right middle and lower lobes, unchanged.  Interval development of an approximately 0.6 cm nodule within the right lower lobe (image 108, series 7; coronal image 116, series 10).  Punctate (approximately 0.4 cm) subpleural nodule within the right middle lobe (image 97, series 7), is unchanged. Unchanged perifissural lymph node within the left major fissure (image 81, series 7).  Upper abdomen: Limited early arterial phase evaluation of the upper abdomen demonstrates a approximately 1.3 cm radiopaque gallstone within neck of an otherwise normal-appearing gallbladder. Note  is made of a small amount of potentials perisplenic fluid (image 154, series 5).  Unchanged appearance of the  approximately 1.5 x 1.2 cm enhancing nodule within the medial limb of the left adrenal gland (image 59, series 5). Unchanged approximately 1.9 cm hypoattenuating cyst arising from the anterior superior pole the right kidney.  Musculoskeletal: No acute or aggressive osseous abnormalities. Regional soft tissues appear normal.  IMPRESSION: 1. Unchanged fusiform aneurysmal dilatation of the ascending thoracic aorta measuring 45 mm in diameter. No evidence of dissection or periaortic stranding on this nongated examination. Aortic aneurysm NOS (ICD10-I71.9). 2. Large amount of irregular predominantly noncalcified atherosclerotic plaque throughout the thoracic aorta, not definitely resulting in a hemodynamically significant stenosis. Aortic Atherosclerosis (ICD10-I70.0). 3. Suspected slight increase in size of penetrating atherosclerotic ulcer involving the caudal aspect of the distal aortic arch, currently measuring 0.9 cm, previously, 0.6 cm. No contrast extravasation or perivascular stranding. 4. Severe emphysematous change with suspected progression of smoking related pulmonary fibrosis, most conspicuous within the subpleural aspect of the left lower lobe, without associated honeycombing. Emphysema (ICD10-J43.9). 5. Borderline enlarged mediastinal and hilar lymph nodes are unchanged compared to the 07/2017 examination and while indeterminate, given stability, they may be reactive given extensive parenchymal abnormalities. 6. The 0.4 cm right middle lobe pulmonary nodule is unchanged compared to the 07/2017 examination, however there has been development of a new approximately 0.6 cm right lower lobe pulmonary nodule. Non-contrast chest CT at 3-6 months is recommended. If the nodules are stable at time of repeat CT, then future CT at 18-24 months (from today's scan) is recommended for high-risk patients. This recommendation follows the consensus statement: Guidelines for Management  of Incidental Pulmonary Nodules Detected on CT Images: From the Fleischner Society 2017; Radiology 2017; 284:228-243. 7. The approximately 1.5 cm enhancing nodule within the medial limb of the left adrenal gland remains indeterminate though unchanged compared to CT scan of the abdomen pelvis performed 07/01/2017. Further evaluation could be performed with dedicated adrenal protocol abdominal CT and/or MRI as indicated. 8. Cholelithiasis without evidence of cholecystitis.   Electronically Signed   By: Sandi Mariscal M.D.   On: 04/11/2018 12:57 I personally reviewed the CT images and concur with the findings noted above  Impression: Thomas [name] is a 63 year old [place] gentleman with a history of tobacco abuse (quit in 2019), strokes, ruptured abdominal aortic aneurysm, ascending aortic aneurysm, thoracic aortic atherosclerosis, hypertension.  About a year ago he had multiple strokes within 2 months.  He also had a ruptured abdominal aortic aneurysm during that time.  Eventually during his work-up he was found to have a 4.5 cm ascending aneurysm and extensive atherosclerotic plaque throughout his aortic arch and proximal descending thoracic aorta.  There is a question is whether this atherosclerotic material might be the source of his strokes.  Certainly that is a possibility although much of it is downstream from the arch vessels.  He remains very physically limited due to severe neuropathy in his feet particularly on the right side.  His activity levels are very limited.  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  He has some lung nodules in the setting of significant pulmonary fibrosis.  Does need continued follow-up.  Plan: Return in 6 months with CT angiogram for follow-up of a sending aorta, arch atherosclerosis, lung nodules, pulmonary fibrosis, and adrenal nodule  Melrose Nakayama, MD [facility] [phone]

## 2018-05-21 ENCOUNTER — Other Ambulatory Visit: Payer: Self-pay | Admitting: Family Medicine

## 2018-05-21 MED FILL — LOSARTAN POTASSIUM 50 MG TA: 50 | 30 days supply | Qty: 30 | Fill #4

## 2018-06-21 MED FILL — LOSARTAN POTASSIUM 50 MG TA: 50 | 30 days supply | Qty: 30 | Fill #5

## 2018-06-23 ENCOUNTER — Observation Stay (HOSPITAL_COMMUNITY)
Admission: EM | Admit: 2018-06-23 | Discharge: 2018-06-24 | Disposition: A | Discharge status: Home / Self Care | Payer: Medicaid Other | Attending: Internal Medicine | Admitting: Internal Medicine

## 2018-06-23 ENCOUNTER — Encounter (HOSPITAL_COMMUNITY): Payer: Self-pay

## 2018-06-23 ENCOUNTER — Other Ambulatory Visit: Payer: Self-pay

## 2018-06-23 ENCOUNTER — Observation Stay (HOSPITAL_COMMUNITY): Payer: Medicaid Other

## 2018-06-23 ENCOUNTER — Emergency Department (HOSPITAL_COMMUNITY): Payer: Medicaid Other

## 2018-06-23 DIAGNOSIS — K219 Gastro-esophageal reflux disease without esophagitis: Secondary | ICD-10-CM | POA: Diagnosis not present

## 2018-06-23 DIAGNOSIS — R42 Dizziness and giddiness: Secondary | ICD-10-CM

## 2018-06-23 DIAGNOSIS — I129 Hypertensive chronic kidney disease with stage 1 through stage 4 chronic kidney disease, or unspecified chronic kidney disease: Secondary | ICD-10-CM | POA: Insufficient documentation

## 2018-06-23 DIAGNOSIS — Z7982 Long term (current) use of aspirin: Secondary | ICD-10-CM | POA: Insufficient documentation

## 2018-06-23 DIAGNOSIS — R2681 Unsteadiness on feet: Secondary | ICD-10-CM | POA: Diagnosis not present

## 2018-06-23 DIAGNOSIS — R69 Illness, unspecified: Secondary | ICD-10-CM | POA: Diagnosis present

## 2018-06-23 DIAGNOSIS — I700 Atherosclerosis of aorta: Secondary | ICD-10-CM | POA: Diagnosis not present

## 2018-06-23 DIAGNOSIS — Z79899 Other long term (current) drug therapy: Secondary | ICD-10-CM

## 2018-06-23 DIAGNOSIS — F17201 Nicotine dependence, unspecified, in remission: Secondary | ICD-10-CM

## 2018-06-23 DIAGNOSIS — Z87891 Personal history of nicotine dependence: Secondary | ICD-10-CM | POA: Insufficient documentation

## 2018-06-23 DIAGNOSIS — I639 Cerebral infarction, unspecified: Secondary | ICD-10-CM | POA: Diagnosis present

## 2018-06-23 DIAGNOSIS — Z9889 Other specified postprocedural states: Secondary | ICD-10-CM

## 2018-06-23 DIAGNOSIS — I739 Peripheral vascular disease, unspecified: Secondary | ICD-10-CM | POA: Insufficient documentation

## 2018-06-23 DIAGNOSIS — M6281 Muscle weakness (generalized): Secondary | ICD-10-CM | POA: Diagnosis not present

## 2018-06-23 DIAGNOSIS — Z7902 Long term (current) use of antithrombotics/antiplatelets: Secondary | ICD-10-CM | POA: Diagnosis not present

## 2018-06-23 DIAGNOSIS — I4510 Unspecified right bundle-branch block: Secondary | ICD-10-CM | POA: Diagnosis not present

## 2018-06-23 DIAGNOSIS — E785 Hyperlipidemia, unspecified: Secondary | ICD-10-CM | POA: Diagnosis not present

## 2018-06-23 DIAGNOSIS — I6381 Other cerebral infarction due to occlusion or stenosis of small artery: Secondary | ICD-10-CM | POA: Diagnosis not present

## 2018-06-23 DIAGNOSIS — N183 Chronic kidney disease, stage 3 (moderate): Secondary | ICD-10-CM | POA: Insufficient documentation

## 2018-06-23 DIAGNOSIS — I10 Essential (primary) hypertension: Secondary | ICD-10-CM

## 2018-06-23 DIAGNOSIS — R112 Nausea with vomiting, unspecified: Secondary | ICD-10-CM

## 2018-06-23 DIAGNOSIS — Z8679 Personal history of other diseases of the circulatory system: Secondary | ICD-10-CM

## 2018-06-23 LAB — CBC WITH DIFFERENTIAL/PLATELET
Abs Immature Granulocytes: 0 10*3/uL (ref 0.00–0.07)
Basophils Absolute: 0.1 10*3/uL (ref 0.0–0.1)
Basophils Relative: 1 %
EOS PCT: 14 %
Eosinophils Absolute: 1.8 10*3/uL — ABNORMAL HIGH (ref 0.0–0.5)
HCT: 44 % (ref 39.0–52.0)
Hemoglobin: 14.3 g/dL (ref 13.0–17.0)
Lymphocytes Relative: 33 %
Lymphs Abs: 4.2 10*3/uL — ABNORMAL HIGH (ref 0.7–4.0)
MCH: 25.7 pg — ABNORMAL LOW (ref 26.0–34.0)
MCHC: 32.5 g/dL (ref 30.0–36.0)
MCV: 79 fL — ABNORMAL LOW (ref 80.0–100.0)
Monocytes Absolute: 0.5 10*3/uL (ref 0.1–1.0)
Monocytes Relative: 4 %
Neutro Abs: 6.1 10*3/uL (ref 1.7–7.7)
Neutrophils Relative %: 48 %
Platelets: 364 10*3/uL (ref 150–400)
RBC: 5.57 MIL/uL (ref 4.22–5.81)
RDW: 15.4 % (ref 11.5–15.5)
WBC: 12.7 10*3/uL — ABNORMAL HIGH (ref 4.0–10.5)
nRBC: 0 % (ref 0.0–0.2)
nRBC: 0 /100 WBC

## 2018-06-23 LAB — LIPID PANEL
Cholesterol: 212 mg/dL — ABNORMAL HIGH (ref 0–200)
HDL: 45 mg/dL (ref >40)
LDL Cholesterol: 144 mg/dL — ABNORMAL HIGH (ref 0–99)
Total CHOL/HDL Ratio: 4.7 RATIO
Triglycerides: 116 mg/dL (ref <150)
VLDL: 23 mg/dL (ref 0–40)

## 2018-06-23 LAB — APTT: aPTT: 31 seconds (ref 24–36)

## 2018-06-23 LAB — COMPREHENSIVE METABOLIC PANEL
ALBUMIN: 3.7 g/dL (ref 3.5–5.0)
ALT: 18 U/L (ref 0–44)
AST: 30 U/L (ref 15–41)
Alkaline Phosphatase: 94 U/L (ref 38–126)
Anion gap: 7 (ref 5–15)
BUN: 21 mg/dL (ref 8–23)
CO2: 23 mmol/L (ref 22–32)
Calcium: 9.4 mg/dL (ref 8.9–10.3)
Chloride: 105 mmol/L (ref 98–111)
Creatinine, Ser: 1.53 mg/dL — ABNORMAL HIGH (ref 0.61–1.24)
GFR calc Af Amer: 55 mL/min — ABNORMAL LOW (ref >60)
GFR calc non Af Amer: 47 mL/min — ABNORMAL LOW (ref >60)
GLUCOSE: 135 mg/dL — AB (ref 70–99)
Potassium: 4.8 mmol/L (ref 3.5–5.1)
SODIUM: 135 mmol/L (ref 135–145)
Total Bilirubin: 0.9 mg/dL (ref 0.3–1.2)
Total Protein: 7.7 g/dL (ref 6.5–8.1)

## 2018-06-23 LAB — URINALYSIS, ROUTINE W REFLEX MICROSCOPIC
Bilirubin Urine: NEGATIVE
GLUCOSE, UA: NEGATIVE mg/dL
Hgb urine dipstick: NEGATIVE
Ketones, ur: NEGATIVE mg/dL
Leukocytes,Ua: NEGATIVE
Nitrite: NEGATIVE
PROTEIN: NEGATIVE mg/dL
Specific Gravity, Urine: 1.012 (ref 1.005–1.030)
pH: 5 (ref 5.0–8.0)

## 2018-06-23 LAB — I-STAT TROPONIN, ED: Troponin i, poc: 0 ng/mL (ref 0.00–0.08)

## 2018-06-23 LAB — HEMOGLOBIN A1C
Hgb A1c MFr Bld: 5.4 % (ref 4.8–5.6)
Mean Plasma Glucose: 108.28 mg/dL

## 2018-06-23 LAB — RAPID URINE DRUG SCREEN, HOSP PERFORMED
Amphetamines: NOT DETECTED
Barbiturates: NOT DETECTED
Benzodiazepines: NOT DETECTED
Cocaine: NOT DETECTED
Opiates: NOT DETECTED
Tetrahydrocannabinol: NOT DETECTED

## 2018-06-23 LAB — I-STAT CREATININE, ED: Creatinine, Ser: 1.5 mg/dL — ABNORMAL HIGH (ref 0.61–1.24)

## 2018-06-23 LAB — PROTIME-INR
INR: 1.1 (ref 0.8–1.2)
Prothrombin Time: 13.9 seconds (ref 11.4–15.2)

## 2018-06-23 MED ORDER — MECLIZINE HCL 25 MG PO TABS
50.0000 mg | ORAL_TABLET | Freq: Once | ORAL | Status: AC
  Administered 2018-06-23: 50 mg via ORAL
  Filled 2018-06-23: qty 2

## 2018-06-23 MED ORDER — ASPIRIN 325 MG PO TABS
325.0000 mg | ORAL_TABLET | Freq: Once | ORAL | Status: AC
  Administered 2018-06-23: 325 mg via ORAL
  Filled 2018-06-23: qty 1

## 2018-06-23 MED ORDER — ENOXAPARIN SODIUM 30 MG/0.3ML ~~LOC~~ SOLN
30.0000 mg | SUBCUTANEOUS | Status: DC
  Administered 2018-06-24: 30 mg via SUBCUTANEOUS
  Filled 2018-06-23: qty 0.3

## 2018-06-23 MED ORDER — PANTOPRAZOLE SODIUM 40 MG PO TBEC: 40.0000 mg | DELAYED_RELEASE_TABLET | Freq: Every day | ORAL | Status: DC

## 2018-06-23 MED ORDER — ONDANSETRON HCL 4 MG/2ML IJ SOLN
4.0000 mg | Freq: Once | INTRAMUSCULAR | Status: AC
  Administered 2018-06-23: 4 mg via INTRAVENOUS
  Filled 2018-06-23: qty 2

## 2018-06-23 MED ORDER — ASPIRIN EC 81 MG PO TBEC
81.0000 mg | DELAYED_RELEASE_TABLET | Freq: Every day | ORAL | Status: DC
  Administered 2018-06-24: 81 mg via ORAL
  Filled 2018-06-23: qty 1

## 2018-06-23 MED ORDER — ATORVASTATIN CALCIUM 10 MG PO TABS
20.0000 mg | ORAL_TABLET | Freq: Every day | ORAL | Status: DC
  Administered 2018-06-23: 20 mg via ORAL
  Filled 2018-06-23: qty 2

## 2018-06-23 MED ORDER — SODIUM CHLORIDE 0.9 % IV BOLUS
1000.0000 mL | Freq: Once | INTRAVENOUS | Status: AC
  Administered 2018-06-23: 1000 mL via INTRAVENOUS

## 2018-06-23 MED ORDER — IOPAMIDOL (ISOVUE-370) INJECTION 76%
50.0000 mL | Freq: Once | INTRAVENOUS | Status: AC | PRN
  Administered 2018-06-23: 50 mL via INTRAVENOUS

## 2018-06-23 MED ORDER — ONDANSETRON HCL 4 MG/2ML IJ SOLN: 4.0000 mg | Freq: Four times a day (QID) | INTRAMUSCULAR | Status: DC | PRN

## 2018-06-23 MED ORDER — STROKE: EARLY STAGES OF RECOVERY BOOK
Freq: Once | Status: AC
  Administered 2018-06-23: 21:00:00

## 2018-06-23 NOTE — ED Notes (Signed)
ED TO INPATIENT HANDOFF REPORT  ED Nurse Name and Phone #: Benjamine Mola [phone]  S Name/Age/Gender Charlyne Mom 63 y.o. male Room/Bed: 042C/042C - Code Status   Code Status: Full Code  Home/SNF/Other Home Patient oriented to: self, place, time and situation Is this baseline? Yes   Triage Complete: Triage complete  Chief Complaint dizzy  Triage Note [facility] reports NV x2 days, dizziness today. Dtr reported he took an old bp med today since he is out of his regular med. VS stable    Allergies No Known Allergies  Level of Care/Admitting Diagnosis ED Disposition    ED Disposition Condition Comment   Admit  Hospital Area: [facility] [[id number]]  Level of Care: Telemetry Medical [104]  Diagnosis: CVA (cerebral vascular accident) [facility]) [[id number]]  Admitting Physician: Aldine Contes [phone]  Attending Physician: Aldine Contes [[id number]]  PT Class (Do Not Modify): Observation [104]  PT Acc Code (Do Not Modify): Observation [10022]       B Medical/Surgery History Past Medical History:  Diagnosis Date  . Hypertension   . Stroke [facility])    Past Surgical History:  Procedure Laterality Date  . ENDOVASCULAR STENT INSERTION N/A 07/01/2017   Procedure: ENDOVASCULAR STENT GRAFT INSERTION;  Surgeon: Serafina Mitchell, MD;  Location: [facility] OR;  Service: Vascular;  Laterality: N/A;  . THROMBECTOMY FEMORAL ARTERY Bilateral 07/01/2017   Procedure: Bilateral IlioFemoral Thrombectomies;  Surgeon: Serafina Mitchell, MD;  Location: [facility] OR;  Service: Vascular;  Laterality: Bilateral;     A IV Location/Drains/Wounds Patient Lines/Drains/Airways Status   Active Line/Drains/Airways    Name:   Placement date:   Placement time:   Site:   Days:   Peripheral IV 06/23/18 Left Forearm   06/23/18    -    Forearm   less than 1   Incision (Closed) 07/01/17 Groin Right   07/01/17    2239     357   Incision (Closed) 07/01/17 Groin Left   07/01/17    2239     357          Intake/Output  Last 24 hours  Intake/Output Summary (Last 24 hours) at 06/23/2018 1753 Last data filed at 06/23/2018 1436 Gross per 24 hour  Intake 1000 ml  Output -  Net 1000 ml    Labs/Imaging Results for orders placed or performed during the hospital encounter of 06/23/18 (from the past 48 hour(s))  CBC with Differential     Status: Abnormal   Collection Time: 06/23/18  1:21 PM  Result Value Ref Range   WBC 12.7 (H) 4.0 - 10.5 K/uL   RBC 5.57 4.22 - 5.81 MIL/uL   Hemoglobin 14.3 13.0 - 17.0 g/dL   HCT 44.0 39.0 - 52.0 %   MCV 79.0 (L) 80.0 - 100.0 fL   MCH 25.7 (L) 26.0 - 34.0 pg   MCHC 32.5 30.0 - 36.0 g/dL   RDW 15.4 11.5 - 15.5 %   Platelets 364 150 - 400 K/uL   nRBC 0.0 0.0 - 0.2 %   Neutrophils Relative % 48 %   Neutro Abs 6.1 1.7 - 7.7 K/uL   Lymphocytes Relative 33 %   Lymphs Abs 4.2 (H) 0.7 - 4.0 K/uL   Monocytes Relative 4 %   Monocytes Absolute 0.5 0.1 - 1.0 K/uL   Eosinophils Relative 14 %   Eosinophils Absolute 1.8 (H) 0.0 - 0.5 K/uL   Basophils Relative 1 %   Basophils Absolute 0.1 0.0 - 0.1 K/uL   nRBC  0 0 /100 WBC   Abs Immature Granulocytes 0.00 0.00 - 0.07 K/uL    Comment: Performed at [facility], [place] [address], [place], [place] [address]  Protime-INR     Status: None   Collection Time: 06/23/18  1:21 PM  Result Value Ref Range   Prothrombin Time 13.9 11.4 - 15.2 seconds   INR 1.1 0.8 - 1.2    Comment: (NOTE) INR goal varies based on device and disease states. Performed at [facility], [place] [address], [place], [place] [address]   APTT     Status: None   Collection Time: 06/23/18  1:21 PM  Result Value Ref Range   aPTT 31 24 - 36 seconds    Comment: Performed at [facility] [address] [place],  [address]  Comprehensive metabolic panel     Status: Abnormal   Collection Time: 06/23/18  1:21 PM  Result Value Ref Range   Sodium 135 135 - 145 mmol/L   Potassium 4.8 3.5 - 5.1 mmol/L    Comment: SLIGHT HEMOLYSIS   Chloride 105  98 - 111 mmol/L   CO2 23 22 - 32 mmol/L   Glucose, Bld 135 (H) 70 - 99 mg/dL   BUN 21 8 - 23 mg/dL   Creatinine, Ser 1.53 (H) 0.61 - 1.24 mg/dL   Calcium 9.4 8.9 - 10.3 mg/dL   Total Protein 7.7 6.5 - 8.1 g/dL   Albumin 3.7 3.5 - 5.0 g/dL   AST 30 15 - 41 U/L   ALT 18 0 - 44 U/L   Alkaline Phosphatase 94 38 - 126 U/L   Total Bilirubin 0.9 0.3 - 1.2 mg/dL   GFR calc non Af Amer 47 (L) >60 mL/min   GFR calc Af Amer 55 (L) >60 mL/min   Anion gap 7 5 - 15    Comment: Performed at [facility] [address] [place],  [address]  I-stat troponin, ED     Status: None   Collection Time: 06/23/18  1:30 PM  Result Value Ref Range   Troponin i, poc 0.00 0.00 - 0.08 ng/mL   Comment 3            Comment: Due to the release kinetics of cTnI, a negative result within the first hours of the onset of symptoms does not rule out myocardial infarction with certainty. If myocardial infarction is still suspected, repeat the test at appropriate intervals.   I-stat Creatinine, ED     Status: Abnormal   Collection Time: 06/23/18  1:32 PM  Result Value Ref Range   Creatinine, Ser 1.50 (H) 0.61 - 1.24 mg/dL  Urinalysis, Routine w reflex microscopic     Status: None   Collection Time: 06/23/18  4:30 PM  Result Value Ref Range   Color, Urine YELLOW YELLOW   APPearance CLEAR CLEAR   Specific Gravity, Urine 1.012 1.005 - 1.030   pH 5.0 5.0 - 8.0   Glucose, UA NEGATIVE NEGATIVE mg/dL   Hgb urine dipstick NEGATIVE NEGATIVE   Bilirubin Urine NEGATIVE NEGATIVE   Ketones, ur NEGATIVE NEGATIVE mg/dL   Protein, ur NEGATIVE NEGATIVE mg/dL   Nitrite NEGATIVE NEGATIVE   Leukocytes,Ua NEGATIVE NEGATIVE    Comment: Performed at [facility] [address] [place], [place] [address]   Ct Head Wo Contrast  Result Date: 06/23/2018 CLINICAL DATA:  Dizziness. EXAM: CT HEAD WITHOUT CONTRAST TECHNIQUE: Contiguous axial images were obtained from the base  of the skull through the vertex without  intravenous contrast. COMPARISON:  CT scan of Aug 24, 2017. FINDINGS: Brain: Mild diffuse cortical atrophy is noted. Mild chronic ischemic white matter disease is noted. No mass effect or midline shift is noted. Ventricular size is within normal limits. There is no evidence of mass lesion, hemorrhage or acute infarction. Vascular: No hyperdense vessel or unexpected calcification. Skull: Normal. Negative for fracture or focal lesion. Sinuses/Orbits: No acute finding. Other: None. IMPRESSION: Mild diffuse cortical atrophy. Mild chronic ischemic white matter disease. No acute intracranial abnormality seen. Electronically Signed   By: Marijo Conception, M.D.   On: 06/23/2018 13:54   Mr Brain Wo Contrast  Result Date: 06/23/2018 CLINICAL DATA:  Nausea and vomiting over the last 2 days. Dizziness. EXAM: MRI HEAD WITHOUT CONTRAST TECHNIQUE: Multiplanar, multiecho pulse sequences of the brain and surrounding structures were obtained without intravenous contrast. COMPARISON:  Head CT same day.  MRI 08/15/2017. FINDINGS: Brain: Diffusion imaging shows a small focus of acute infarction in the right temporal lobe just lateral and superior to the temporal horn of the right lateral ventricle. No swelling or hemorrhage. No other acute brain finding. Elsewhere, there chronic small-vessel ischemic changes affecting the brainstem and cerebellum. Old small vessel infarctions of the thalami, right more than left, the basal ganglia and the cerebral hemispheric white matter. Scattered foci of hemosiderin deposition related to old small vessel infarctions. No large vessel territory infarction. No mass lesion, hemorrhage, hydrocephalus or extra-axial collection. Vascular: Both internal carotid arteries show antegrade flow. No apparent flow in the right vertebral artery. This is a chronic finding. Skull and upper cervical spine: Negative Sinuses/Orbits: Mild seasonal mucosal inflammation. No advanced sinusitis. Orbits negative. Other:  None IMPRESSION: Background pattern of old small vessel infarctions throughout the brain. Small focus of acute infarction in the right temporal lobe just lateral and superior to the temporal horn of the right lateral ventricle. No swelling or hemorrhage. Electronically Signed   By: Nelson Chimes M.D.   On: 06/23/2018 16:22    Pending Labs Unresulted Labs (From admission, onward)    Start     Ordered   06/23/18 1733  Hemoglobin A1c  Once,   R     06/23/18 1733   06/23/18 1733  Lipid panel  Once,   R     06/23/18 1733          Vitals/Pain Today's Vitals   06/23/18 1530 06/23/18 1645 06/23/18 1700 06/23/18 1715  BP: (!) 150/86 (!) 154/88 (!) 156/95 (!) 150/95  Pulse: 67 69 83 72  Resp: 13 12 12 13   Temp:      TempSrc:      SpO2: 97% 98% 97% 97%  Weight:      Height:      PainSc:        Isolation Precautions No active isolations  Medications Medications  enoxaparin (LOVENOX) injection 30 mg (has no administration in time range)  aspirin tablet 325 mg (has no administration in time range)    Followed by  aspirin EC tablet 81 mg (has no administration in time range)  atorvastatin (LIPITOR) tablet 20 mg (has no administration in time range)  iopamidol (ISOVUE-370) 76 % injection 50 mL (has no administration in time range)  ondansetron (ZOFRAN) injection 4 mg (4 mg Intravenous Given 06/23/18 1326)  sodium chloride 0.9 % bolus 1,000 mL (0 mLs Intravenous Stopped 06/23/18 1436)  meclizine (ANTIVERT) tablet 50 mg (50 mg Oral Given 06/23/18 1434)  Mobility walks with person assist Moderate fall risk   Focused Assessments Neuro Assessment Handoff:  Swallow screen pass? Yes  Cardiac Rhythm: Normal sinus rhythm NIH Stroke Scale ( + Modified Stroke Scale Criteria)  Interval: Initial Level of Consciousness (1a.)   : Alert, keenly responsive LOC Questions (1b. )   +: Answers both questions correctly LOC Commands (1c. )   + : Performs both tasks correctly Best Gaze (2. )  +:  Normal Visual (3. )  +: No visual loss Facial Palsy (4. )    : Normal symmetrical movements Motor Arm, Left (5a. )   +: No drift Motor Arm, Right (5b. )   +: No drift Motor Leg, Left (6a. )   +: No drift Motor Leg, Right (6b. )   +: No drift Limb Ataxia (7. ): Absent Sensory (8. )   +: Normal, no sensory loss Best Language (9. )   +: No aphasia Dysarthria (10. ): Normal Extinction/Inattention (11.)   +: No Abnormality Modified SS Total  +: 0 Complete NIHSS TOTAL: 0     Neuro Assessment: Exceptions to WDL Neuro Checks:   Initial (06/23/18 1652)  Last Documented NIHSS Modified Score: 0 (06/23/18 1652) Has TPA been given? No If patient is a Neuro Trauma and patient is going to OR before floor call report to [facility] nurse: [phone] or [phone]     R Recommendations: See Admitting Provider Note  Report given to:   Additional Notes:

## 2018-06-23 NOTE — Consult Note (Addendum)
Referring Physician: Velia Meyer, PA   Reason for Consult: Neurologic opinion for stroke CC: "n/v"  HPI: Thomas [name] is an 63 y.o. male Thomas [name] is an 63 y.o. male Thomas [name] is a 63 yo man with a medical history ofmultiplestrokes, but minimal residual effect of chronic right leg pain and bilateral lower extremity numbness (mRS 1),HTN, PVD,ruptured AAA s/p repair, and thoracic aortic atherosclerosis. On 3/29 he presented to the ED with dizziness (which is normal for him for many years), but new onset of vomiting that started this morning. He denies that there is any "new" quality to this usual dizziness. The dizziness improves when he closes his eyes. He denies vision changes or headache. He had 2-3 episodes of vomiting at home and a few more episodes in the hospital. He states he feels generally unwell and general weakness, but no focal weakness or falls. He takes full dose aspirin daily. MRI brain was done, which incidentally shows a new ischemic stroke on the right lateral temporal horn of ventricle. There is no clear onset time of stroke nor clinical symptoms, thus no stroke interventions offered at this time.    Past Medical History Past Medical History:  Diagnosis Date  . Hypertension   . Stroke [facility]     Surgical History Past Surgical History:  Procedure Laterality Date  . ENDOVASCULAR STENT INSERTION N/A 07/01/2017   Procedure: ENDOVASCULAR STENT GRAFT INSERTION;  Surgeon: Serafina Mitchell, MD;  Location: [facility] OR;  Service: Vascular;  Laterality: N/A;  . THROMBECTOMY FEMORAL ARTERY Bilateral 07/01/2017   Procedure: Bilateral IlioFemoral Thrombectomies;  Surgeon: Serafina Mitchell, MD;  Location: [facility] OR;  Service: Vascular;  Laterality: Bilateral;    Family History  History reviewed. No pertinent family history.  Social History:   reports that he quit smoking about 11 months ago. His smoking use included cigarettes. He has never used smokeless tobacco. He reports that he does not drink  alcohol or use drugs.  Allergies:  No Known Allergies  Home Medications:  (Not in a hospital admission)   Hospital Medications . aspirin  325 mg Oral Once   Followed by  . [START ON 06/24/2018] aspirin EC  81 mg Oral Daily  . atorvastatin  20 mg Oral q1800  . [START ON 06/24/2018] enoxaparin (LOVENOX) injection  30 mg Subcutaneous Q24H    YKD:XIPJASN obtained from pt  General ROS: reports chills, fatigue, no fever, night sweats, weight gain or weight loss Psychological ROS: negative for - behavioral disorder, hallucinations, memory difficulties, mood swings or suicidal ideation Ophthalmic ROS: negative for - blurry vision, double vision, eye pain or loss of vision ENT ROS: negative for - epistaxis, nasal discharge, oral lesions, sore throat, tinnitus. Does c/o chronic vertigo Hematological and Lymphatic ROS: negative for - bleeding problems, bruising or swollen lymph nodes Endocrine ROS: negative for - galactorrhea, hair pattern changes, polydipsia/polyuria or temperature intolerance Respiratory ROS: negative for - cough, hemoptysis, shortness of breath or wheezing Cardiovascular ROS: negative for - chest pain, dyspnea on exertion, edema or irregular heartbeat Gastrointestinal ROS: negative for - abdominal pain, diarrhea, hematemesis, nausea/vomiting or stool incontinence Genito-Urinary ROS: negative for - dysuria, hematuria, incontinence or urinary frequency/urgency Musculoskeletal ROS: negative for - joint swelling or muscular weakness Neurological ROS: as noted in HPI Dermatological ROS: negative for rash and skin lesion changes Dermatological ROS: negative for rash and skin lesion changes   Physical Examination:  Vitals:   06/23/18 1530 06/23/18 1645 06/23/18 1700 06/23/18 1715  BP: (!) 150/86 Marland Kitchen)  154/88 (!) 156/95 (!) 150/95  Pulse: 67 69 83 72  Resp: 13 12 12 13   Temp:      TempSrc:      SpO2: 97% 98% 97% 97%  Weight:      Height:        General - mild  distress Heart - Regular rate and rhythm - no murmer appreciated Lungs - Clear to auscultation  Abdomen - Soft - non tender Extremities - Distal pulses intact - no edema Skin - Warm and dry   Neurologic Examination:  Mental Status: Alert, oriented, thought content appropriate.  Speech fluent without evidence of aphasia. Able to follow 3 step commands without difficulty. Cranial Nerves: II: Discs not visualized; Visual fields grossly normal, pupils equal, round, reactive to light III,IV, VI: ptosis not present, extra-ocular motions intact bilaterally V,VII: smile symmetric, facial light touch sensation normal bilaterally VIII: hearing normal bilaterally IX,X: gag reflex present XI: bilateral shoulder shrug XII: midline tongue extension Motor: RUE - 5/5    LUE - 5/5   RLE - 5/5    LLE - 5/5 Tone and bulk:normal tone throughout; no atrophy noted Sensory: Intact to light touch in all extremities except notes that the left thigh has chronic numbness/tingling. Deep Tendon Reflexes: 2+ and symmetric throughout Plantars: Right: downgoing   Left: downgoing Cerebellar: normal finger-to-nose and normal heel-to-shin test Gait: deferred at this time.  NIHSS 1a Level of Conscious:0 1b LOC Questions: 0 1c LOC Commands: 0 2 Best Gaze: 0 3 Visual: 0 4 Facial Palsy: 0 5a Motor Arm - left: 0 5b Motor Arm - Right: 0 6a Motor Leg - Left: 0 6b Motor Leg - Right: 0 7 Limb Ataxia: 0 8 Sensory: 1 9 Best Language: 0 10 Dysarthria:0 11 Extinct. and Inattention:0 TOTAL: 1 (for chronic finding of altered sensation in left leg)   LABORATORY STUDIES:  Basic Metabolic Panel: Recent Labs  Lab 06/23/18 1321 06/23/18 1332  NA 135  --   K 4.8  --   CL 105  --   CO2 23  --   GLUCOSE 135*  --   BUN 21  --   CREATININE 1.53* 1.50*  CALCIUM 9.4  --     Liver Function Tests: Recent Labs  Lab 06/23/18 1321  AST 30  ALT 18  ALKPHOS 94  BILITOT 0.9  PROT 7.7  ALBUMIN 3.7     CBC: Recent Labs  Lab 06/23/18 1321  WBC 12.7*  NEUTROABS 6.1  HGB 14.3  HCT 44.0  MCV 79.0*  PLT 364   Coagulation Studies: Recent Labs    06/23/18 1321  LABPROT 13.9  INR 1.1   Urinalysis:  Recent Labs  Lab 06/23/18 1630  COLORURINE YELLOW  LABSPEC 1.012  PHURINE 5.0  GLUCOSEU NEGATIVE  HGBUR NEGATIVE  BILIRUBINUR NEGATIVE  KETONESUR NEGATIVE  PROTEINUR NEGATIVE  NITRITE NEGATIVE  LEUKOCYTESUR NEGATIVE   Lipid Panel:     Component Value Date/Time   CHOL 126 11/08/2017 1313   TRIG 106 11/08/2017 1313   HDL 41 11/08/2017 1313   CHOLHDL 3.1 11/08/2017 1313   CHOLHDL 4.4 08/16/2017 0625   VLDL 19 08/16/2017 0625   LDLCALC 64 11/08/2017 1313    HgbA1C:  Lab Results  Component Value Date   HGBA1C 5.6 06/29/2017   IMAGING: Ct Head Wo Contrast  Result Date: 06/23/2018 CLINICAL DATA:  Dizziness. EXAM: CT HEAD WITHOUT CONTRAST TECHNIQUE: Contiguous axial images were obtained from the base of the skull through the vertex without intravenous  contrast. COMPARISON:  CT scan of Aug 24, 2017. FINDINGS: Brain: Mild diffuse cortical atrophy is noted. Mild chronic ischemic white matter disease is noted. No mass effect or midline shift is noted. Ventricular size is within normal limits. There is no evidence of mass lesion, hemorrhage or acute infarction. Vascular: No hyperdense vessel or unexpected calcification. Skull: Normal. Negative for fracture or focal lesion. Sinuses/Orbits: No acute finding. Other: None. IMPRESSION: Mild diffuse cortical atrophy. Mild chronic ischemic white matter disease. No acute intracranial abnormality seen. Electronically Signed   By: Marijo Conception, M.D.   On: 06/23/2018 13:54   Mr Brain Wo Contrast  Result Date: 06/23/2018 CLINICAL DATA:  Nausea and vomiting over the last 2 days. Dizziness. EXAM: MRI HEAD WITHOUT CONTRAST TECHNIQUE: Multiplanar, multiecho pulse sequences of the brain and surrounding structures were obtained without intravenous  contrast. COMPARISON:  Head CT same day.  MRI 08/15/2017. FINDINGS: Brain: Diffusion imaging shows a small focus of acute infarction in the right temporal lobe just lateral and superior to the temporal horn of the right lateral ventricle. No swelling or hemorrhage. No other acute brain finding. Elsewhere, there chronic small-vessel ischemic changes affecting the brainstem and cerebellum. Old small vessel infarctions of the thalami, right more than left, the basal ganglia and the cerebral hemispheric white matter. Scattered foci of hemosiderin deposition related to old small vessel infarctions. No large vessel territory infarction. No mass lesion, hemorrhage, hydrocephalus or extra-axial collection. Vascular: Both internal carotid arteries show antegrade flow. No apparent flow in the right vertebral artery. This is a chronic finding. Skull and upper cervical spine: Negative Sinuses/Orbits: Mild seasonal mucosal inflammation. No advanced sinusitis. Orbits negative. Other: None IMPRESSION: Background pattern of old small vessel infarctions throughout the brain. Small focus of acute infarction in the right temporal lobe just lateral and superior to the temporal horn of the right lateral ventricle. No swelling or hemorrhage. Electronically Signed   By: Nelson Chimes M.D.   On: 06/23/2018 16:22   Prior CTA with both anterior and posterior stenoses and atherosclerosis.  Chronic right M1 stenosis.  Vertebral artery origin stenosis bilaterally.  Assessment: 63 y.o. male with history ofmultiplestrokes, but minimal residual effect of chronic right leg pain and bilateral lower extremity numbness (mRS 1),HTN, PVD,ruptured AAA s/p repair, and thoracic aortic atherosclerosis. On 3/29 he presented to the ED with dizziness (which is normal for him for many years), but new onset of vomiting that started this morning. He denies that there is any "new" quality to this usual dizziness. The dizziness improves when he closes his  eyes. He denies vision changes or headache. He had 2-3 episodes of vomiting at home and a few more episodes in the hospital. He states he feels generally unwell and general weakness, but no focal weakness or falls. He takes full dose aspirin daily. MRI brain was done, which shows a new ischemic stroke on the right lateral temporal horn of ventricle.  Could be incidental versus atheroembolic versus small vessel.  Of note he was on Coumadin for 3 months for soft plaque, difficult to control INR and was discontinued and started on aspirin only after that.  Stroke:  right lateral temporal horn of ventricle. Etiology small vessel versus athero-embolic  Plan:  [id number], fasting lipid panel  CT Angiogram Head and Neck  PT consult, OT consult, Speech consult  Echocardiogram  Prophylactic therapy - Antiplatelet medication-aspirin 325 daily for now.  Might need dual antiplatelets depending on results of the imaging studies.  Allow for permissive  hypertension for the first 24-48h - only treat PRN if SBP >220 mmHg. Blood pressures can be gradually normalized to SBP<140 upon discharge  Statin Therapy - LDL goal < 70  Risk factor modification  Telemetry monitoring  Frequent neuro checks  Fall Precautions  DVT prophelaxis  NPO until swallowing evaluation has been passed (aspirin suppository if needed)  Desiree Metzger-Cihelka, ARNP-C, ANVP-BC [facility]  Attending Neurologist's note to follow  Attending Neurohospitalist Addendum Patient seen and examined with APP/Resident. Agree with the history and physical as documented above. Agree with the plan as documented, which I helped formulate. I have independently reviewed the chart, obtained history, review of systems and examined the patient.I have personally reviewed pertinent head/neck/spine imaging (CT/MRI). Please feel free to call with any questions. --- Amie Portland, MD [facility] Pager: [phone] If  7pm to 7am, please call on call as listed on AMION.

## 2018-06-23 NOTE — ED Provider Notes (Signed)
[place] EMERGENCY DEPARTMENT Provider Note   CSN: [id number] Arrival date & time: 06/23/18  1234    History   Chief Complaint Chief Complaint  Patient presents with   Nausea   Emesis   Dizziness    HPI    Thomas [name] is a 63 y.o. male with a PMHx of HTN, CVA, CKD, ruptured AAA s/p stent repair and thrombectomy, vertigo, and other conditions listed below, who presents to the ED with complaints of dizziness, nausea, and vomiting that began around 10 or 11 AM.  History is difficult due to language barrier, interpreter was used although history was still very difficult to obtain.  Patient states that he started feeling dizzy around 10 or 11 AM, describes it as a spinning sensation.  He has had a total of 3 episodes of nonbloody nonbilious emesis.  He reports feeling generally weak and fatigued but also reports right leg weakness.  He took aspirin and a half of a Benicar around 6 AM, and then took another aspirin around 10 AM.  He has not noticed any improvement with his aspirin dose.  No known aggravating factors.  No recent changes in medications.  His PCP is at the [facility].  He denies any headache, vision changes, fevers, chills, chest pain, shortness breath, abdominal pain, diarrhea, constipation, hematemesis, dysuria, hematuria, numbness, tingling, or any other complaints at this time.  The history is provided by the patient and medical records. The history is limited by a language barrier. A language interpreter was used Administrator, arts).  Emesis  Associated symptoms: no abdominal pain, no arthralgias, no chills, no diarrhea, no fever, no headaches and no myalgias   Dizziness  Associated symptoms: nausea, vomiting and weakness   Associated symptoms: no chest pain, no diarrhea, no headaches and no shortness of breath     Past Medical History:  Diagnosis Date   Hypertension    Stroke [facility])     Patient Active Problem List   Diagnosis Date Noted   Thoracic aortic aneurysm without rupture ([place])    Chronic anticoagulation    Chronic ischemic vertebrobasilar artery cerebellar stroke 08/18/2017   Gait disorder    Paraparesis ([place])    Hypertension 08/15/2017   Gait instability 08/15/2017   Pelvic pain 08/15/2017   Chronic kidney disease 08/15/2017   Abnormal CT scan, pelvis 08/15/2017   Meniere disease, left 08/08/2017   Acute respiratory failure with hypoxia (HCC)    Acute blood loss anemia 07/02/2017   S/P AAA repair    LLQ abdominal pain    Ruptured abdominal aortic aneurysm (AAA) ([place])    Hypotension    Bacteremia due to Streptococcus 06/30/2017   Chest pain 06/28/2017   CAP (community acquired pneumonia): Clinical 06/28/2017   Acute renal failure superimposed on stage 3 chronic kidney disease ([place]) 06/28/2017   Dehydration 06/28/2017   Leukocytosis 06/28/2017   Elevated troponin 06/28/2017   Dizziness 06/28/2017   Vertigo 06/28/2017   Acute pericarditis: Probable 06/28/2017    Past Surgical History:  Procedure Laterality Date   ENDOVASCULAR STENT INSERTION N/A 07/01/2017   Procedure: ENDOVASCULAR STENT GRAFT INSERTION;  Surgeon: Serafina Mitchell, MD;  Location: [facility] OR;  Service: Vascular;  Laterality: N/A;   THROMBECTOMY FEMORAL ARTERY Bilateral 07/01/2017   Procedure: Bilateral IlioFemoral Thrombectomies;  Surgeon: Serafina Mitchell, MD;  Location: [facility] OR;  Service: Vascular;  Laterality: Bilateral;        Home Medications    Prior to Admission medications  Medication Sig Start Date End Date Taking? Authorizing Provider  acetaminophen (TYLENOL) 500 MG tablet Take 500 mg by mouth every 6 (six) hours as needed (for pain or headaches).    [provider]  aspirin EC 325 MG tablet Take 1 tablet (325 mg total) by mouth daily. 12/17/17   Venancio Poisson, NP  gabapentin (NEURONTIN) 300 MG capsule Take 1 capsule (300 mg total) by mouth 2 (two) times daily.  02/12/18 05/13/18  Venancio Poisson, NP  losartan (COZAAR) 50 MG tablet Take 1 tablet (50 mg total) by mouth daily. 01/07/18   Gildardo Pounds, NP  omeprazole (PRILOSEC) 20 MG capsule TAKE 1 CAPSULE (20 MG TOTAL) BY MOUTH DAILY. 05/21/18   Charlott Rakes, MD    Family History No family history on file.  Social History Social History   Tobacco Use   Smoking status: Former Smoker    Types: Cigarettes    Last attempt to quit: 07/16/2017    Years since quitting: 0.9   Smokeless tobacco: Never Used  Substance Use Topics   Alcohol use: No   Drug use: No     Allergies   Patient has no known allergies.   Review of Systems Review of Systems  Constitutional: Negative for chills and fever.  Eyes: Negative for visual disturbance.  Respiratory: Negative for shortness of breath.   Cardiovascular: Negative for chest pain.  Gastrointestinal: Positive for nausea and vomiting. Negative for abdominal pain, constipation and diarrhea.  Genitourinary: Negative for dysuria and hematuria.  Musculoskeletal: Negative for arthralgias and myalgias.  Skin: Negative for color change.  Allergic/Immunologic: Negative for immunocompromised state.  Neurological: Positive for dizziness and weakness. Negative for numbness and headaches.  Psychiatric/Behavioral: Negative for confusion.   All other systems reviewed and are negative for acute change except as noted in the HPI.    Physical Exam Updated Vital Signs BP 137/80    Pulse 66    Temp (!) 97.5 F (36.4 C) (Oral)    Resp 14    Ht 5\' 7"  (1.702 m)    Wt 59 kg    SpO2 95%    BMI 20.36 kg/m    Physical Exam Vitals signs and nursing note reviewed.  Constitutional:      General: He is not in acute distress.    Appearance: Normal appearance. He is well-developed. He is not toxic-appearing.     Comments: Afebrile, nontoxic, NAD  HENT:     Head: Normocephalic and atraumatic.  Eyes:     General: Vision grossly intact.        Right eye: No  discharge.        Left eye: No discharge.     Extraocular Movements: Extraocular movements intact.     Conjunctiva/sclera: Conjunctivae normal.     Pupils: Pupils are equal, round, and reactive to light.     Comments: PERRL, EOMI, no nystagmus  Neck:     Musculoskeletal: Normal range of motion and neck supple. Normal range of motion. No neck rigidity, spinous process tenderness or muscular tenderness.     Comments: FROM intact without spinous process TTP, no bony stepoffs or deformities, no paraspinous muscle TTP or muscle spasms. No rigidity or meningeal signs. No bruising or swelling.  Cardiovascular:     Rate and Rhythm: Normal rate and regular rhythm.     Pulses: Normal pulses.     Heart sounds: Normal heart sounds, S1 normal and S2 normal. No murmur. No friction rub. No gallop.   Pulmonary:  Effort: Pulmonary effort is normal. No respiratory distress.     Breath sounds: Normal breath sounds. No decreased breath sounds, wheezing, rhonchi or rales.  Abdominal:     General: Bowel sounds are normal. There is no distension.     Palpations: Abdomen is soft. Abdomen is not rigid.     Tenderness: There is no abdominal tenderness. There is no right CVA tenderness, left CVA tenderness, guarding or rebound. Negative signs include Murphy's sign and McBurney's sign.     Comments: Abd soft NTND, no r/g/r  Musculoskeletal: Normal range of motion.     Comments: MAE x4 Strength and sensation grossly intact in all extremities Distal pulses intact Gait fairly steady although with somewhat shuffling gait, nonataxic  Skin:    General: Skin is warm and dry.     Findings: No rash.  Neurological:     General: No focal deficit present.     Mental Status: He is alert and oriented to person, place, and time.     GCS: GCS eye subscore is 4. GCS verbal subscore is 5. GCS motor subscore is 6.     Cranial Nerves: Cranial nerves are intact. No cranial nerve deficit.     Sensory: Sensation is intact. No  sensory deficit.     Motor: Motor function is intact.     Coordination: Coordination is intact. Coordination normal.     Gait: Gait normal.     Comments: CN 2-12 grossly intact A&O x4 GCS 15 Sensation and strength intact symmetrically in all extremities Gait nonataxic but shuffling gait Coordination with finger-to-nose WNL Neg pronator drift   Psychiatric:        Mood and Affect: Mood and affect normal.        Behavior: Behavior normal.      ED Treatments / Results  Labs (all labs ordered are listed, but only abnormal results are displayed) Labs Reviewed  CBC WITH DIFFERENTIAL/PLATELET - Abnormal; Notable for the following components:      Result Value   WBC 12.7 (*)    MCV 79.0 (*)    MCH 25.7 (*)    Lymphs Abs 4.2 (*)    Eosinophils Absolute 1.8 (*)    All other components within normal limits  COMPREHENSIVE METABOLIC PANEL - Abnormal; Notable for the following components:   Glucose, Bld 135 (*)    Creatinine, Ser 1.53 (*)    GFR calc non Af Amer 47 (*)    GFR calc Af Amer 55 (*)    All other components within normal limits  I-STAT CREATININE, ED - Abnormal; Notable for the following components:   Creatinine, Ser 1.50 (*)    All other components within normal limits  PROTIME-INR  APTT  URINALYSIS, ROUTINE W REFLEX MICROSCOPIC  I-STAT TROPONIN, ED    EKG EKG Interpretation  Date/Time:  Sunday June 23 2018 12:44:06 EDT Ventricular Rate:  60 PR Interval:    QRS Duration: 143 QT Interval:  454 QTC Calculation: 342 R Axis:   -42 Text Interpretation:  Sinus rhythm RBBB and LAFB No significant change since last tracing Confirmed by Pattricia Boss [phone]) on 06/23/2018 12:48:08 PM   Radiology Ct Head Wo Contrast  Result Date: 06/23/2018 CLINICAL DATA:  Dizziness. EXAM: CT HEAD WITHOUT CONTRAST TECHNIQUE: Contiguous axial images were obtained from the base of the skull through the vertex without intravenous contrast. COMPARISON:  CT scan of Aug 24, 2017. FINDINGS:  Brain: Mild diffuse cortical atrophy is noted. Mild chronic ischemic white matter disease is  noted. No mass effect or midline shift is noted. Ventricular size is within normal limits. There is no evidence of mass lesion, hemorrhage or acute infarction. Vascular: No hyperdense vessel or unexpected calcification. Skull: Normal. Negative for fracture or focal lesion. Sinuses/Orbits: No acute finding. Other: None. IMPRESSION: Mild diffuse cortical atrophy. Mild chronic ischemic white matter disease. No acute intracranial abnormality seen. Electronically Signed   By: Marijo Conception, M.D.   On: 06/23/2018 13:54   Mr Brain Wo Contrast  Result Date: 06/23/2018 CLINICAL DATA:  Nausea and vomiting over the last 2 days. Dizziness. EXAM: MRI HEAD WITHOUT CONTRAST TECHNIQUE: Multiplanar, multiecho pulse sequences of the brain and surrounding structures were obtained without intravenous contrast. COMPARISON:  Head CT same day.  MRI 08/15/2017. FINDINGS: Brain: Diffusion imaging shows a small focus of acute infarction in the right temporal lobe just lateral and superior to the temporal horn of the right lateral ventricle. No swelling or hemorrhage. No other acute brain finding. Elsewhere, there chronic small-vessel ischemic changes affecting the brainstem and cerebellum. Old small vessel infarctions of the thalami, right more than left, the basal ganglia and the cerebral hemispheric white matter. Scattered foci of hemosiderin deposition related to old small vessel infarctions. No large vessel territory infarction. No mass lesion, hemorrhage, hydrocephalus or extra-axial collection. Vascular: Both internal carotid arteries show antegrade flow. No apparent flow in the right vertebral artery. This is a chronic finding. Skull and upper cervical spine: Negative Sinuses/Orbits: Mild seasonal mucosal inflammation. No advanced sinusitis. Orbits negative. Other: None IMPRESSION: Background pattern of old small vessel infarctions  throughout the brain. Small focus of acute infarction in the right temporal lobe just lateral and superior to the temporal horn of the right lateral ventricle. No swelling or hemorrhage. Electronically Signed   By: Nelson Chimes M.D.   On: 06/23/2018 16:22    Procedures Procedures (including critical care time)  Medications Ordered in ED Medications  ondansetron (ZOFRAN) injection 4 mg (4 mg Intravenous Given 06/23/18 1326)  sodium chloride 0.9 % bolus 1,000 mL (0 mLs Intravenous Stopped 06/23/18 1436)  meclizine (ANTIVERT) tablet 50 mg (50 mg Oral Given 06/23/18 1434)     Initial Impression / Assessment and Plan / ED Course  I have reviewed the triage vital signs and the nursing notes.  Pertinent labs & imaging results that were available during my care of the patient were reviewed by me and considered in my medical decision making (see chart for details).        63 y.o. male here with dizziness that began around 10 or 11 AM with associated nausea and vomiting and generalized weakness.  He also states that his right leg feels weak.  On exam, no focal neuro deficits, somewhat shuffling gait, symmetric strength in all extremities, no abdominal tenderness.  Will get labs, CT head, give Zofran and fluids, and reassess shortly. Discussed case with my attending Dr. Jeanell Sparrow who agrees with plan.   2:17 PM CBC w/diff with mildly elevated WBC 12.7 but otherwise fairly unremarkable. CMP with fairly stable kidney function, otherwise unremarkable. Trop neg. APTT WNL. INR WNL. CT head with mild diffuse cortical atrophy and mild chronic ischemic white matter disease without acute abnormalities. EKG nonischemic. U/A not yet done. Pt feeling a little better. Will consult neurology to discuss case.   2:30 PM Dr. Rory Percy of neurology returning page, would like MRI brain wo contrast to eval for stroke; no CTA or MRA at this time. If stroke, then admit. If no stroke,  then likely d/c home with meclizine. Would like  trial of meclizine given now. Will reassess shortly.   4:36 PM MRI brain showing background pattern of old small vessel infarctions throughout brain. Small focus of acute infarct in R temporal lobe just lateral and superior to temporal horn of R lateral ventricle. No swelling or hemorrhage. Pt still feeling dizzy, but states nausea is better. Will re-page Dr. Rory Percy and proceed with admission.   4:54 PM U/A without evidence of infection. Spoke with Dr. Rory Percy of neurology who will consult on pt while he's here, wants CTA head/neck ordered which I have ordered. Dr. Berline Lopes of IM residency returning page and will admit. Holding orders to be placed by admitting team. Please see their notes for further documentation of care. I appreciate their help with this pleasant pt's care. Pt stable at time of admission.    Final Clinical Impressions(s) / ED Diagnoses   Final diagnoses:  Acute ischemic stroke (HCC)  Dizziness  Nausea and vomiting in adult patient    ED Discharge Orders    [address] [place], [place] 06/23/18 1657    Pattricia Boss, MD 06/27/18 1143

## 2018-06-23 NOTE — ED Notes (Signed)
Patient transported to MRI 

## 2018-06-23 NOTE — ED Notes (Signed)
Pt reminded for the need for urine. Pt has urinal.

## 2018-06-23 NOTE — ED Notes (Signed)
Called dtr to let her know that pt was being moved to 3W room 37

## 2018-06-23 NOTE — H&P (Signed)
Date: 06/23/2018               Patient Name:  Thomas [name] MRN: [id number]  DOB: [birth year] Age / Sex: 63 y.o., male   PCP: Gildardo Pounds, NP         Medical Service: Internal Medicine Teaching Service         Attending Physician: Dr. Aldine Contes, MD    First Contact: Dr. Annie Paras Pager: [phone]  Second Contact: Dr. Berline Lopes Pager: [phone]       After Hours (After 5p/  First Contact Pager: [phone]  weekends / holidays): Second Contact Pager: [phone]   Chief Complaint: dizziness and vomiting  History of Present Illness: Mr. [place] is a 63 yo man with a medical history of multiple CVAs, HTN, PVD, ruptured AAA s/p emergent endovascular repair, thoracic aortic atherosclerosis, CKDIII, and GERD who presented to the ED with lightheadedness and vomiting that started this morning. He denies room spinning. He states the dizziness is present at rest, but worsens when he stands up and moves. The dizziness improves when he closes his eyes. He denies vision changes or headache. He had 2-3 episodes of vomiting at home and a few more episodes in the hospital. He endorses generalized weakness, but no focal weakness or falls. He takes full dose aspirin daily.   He has a history of CVAs, primarily cerebellar. He has intermittent mild dizziness, but no other residual deficits. He has chronic right leg pain and bilateral lower extremity neuropathy. He has difficulty ambulating at baseline because of this. He underwent bilateral iliofemoral enterectomy and BLE thrombolectomy in 2019, but the symptoms have been present for longer.    He states that he takes a daily aspirin, a medication for GERD, and a medication for high blood pressure. He ran out of his BP medication on Friday and his PCP's clinic was closed. Per chart review, the patient has also been on a statin (with concern for statin myalgias) and gabapentin, but it is unclear whether he is on these now.   Upon arrival to the ED, patient was  afebrile and hypertensive in the 034J systolic. Labs significant for leukocytosis of 12.7, Cr 1.5 (baseline 1.2-1.5), negative troponin. UA unremarkable. EKG showed NSR, RBBB, and no changes from prior. Brain MRI showed new acute infarct in the right temporal lobe. He received zofran, meclizine and 1L NS.  Meds:   No current facility-administered medications on file prior to encounter.    Current Outpatient Medications on File Prior to Encounter  Medication Sig Dispense Refill  . aspirin EC 325 MG tablet Take 1 tablet (325 mg total) by mouth daily. 30 tablet 0  . losartan (COZAAR) 50 MG tablet Take 1 tablet (50 mg total) by mouth daily. (Patient not taking: Reported on 06/23/2018) 90 tablet 3  . omeprazole (PRILOSEC) 20 MG capsule TAKE 1 CAPSULE (20 MG TOTAL) BY MOUTH DAILY. (Patient not taking: Reported on 06/23/2018) 30 capsule 2   Allergies: Allergies as of 06/23/2018  . (No Known Allergies)   Past Medical History:  Diagnosis Date  . Hypertension   . Stroke [facility])    Past Surgical History:  Procedure Laterality Date  . ENDOVASCULAR STENT INSERTION N/A 07/01/2017   Procedure: ENDOVASCULAR STENT GRAFT INSERTION;  Surgeon: Serafina Mitchell, MD;  Location: [facility] OR;  Service: Vascular;  Laterality: N/A;  . THROMBECTOMY FEMORAL ARTERY Bilateral 07/01/2017   Procedure: Bilateral IlioFemoral Thrombectomies;  Surgeon: Serafina Mitchell, MD;  Location: [place];  Service:  Vascular;  Laterality: Bilateral;    Family History:  History reviewed. No pertinent family history.   Social History: Lives with his cousin and extended family. Former smoker, quit 06/2017. Denies etoh or illicit drug use.   Review of Systems: A complete ROS was negative except as per HPI.   Physical Exam: Blood pressure (!) 150/95, pulse 72, temperature (!) 97.5 F (36.4 C), temperature source Oral, resp. rate 13, height 5\' 7"  (1.702 m), weight 59 kg, SpO2 97 %.  Constitutional: Well-developed, well-nourished, and in no  distress.  Neuro: Alert and oriented. Cranial nerves II-XII intact. Strength 5/5 throughout. Decreased sensation to touch in bilateral lower extremities. No dysmetria or tremor on finger-to-nose.  Cardiovascular: Normal rate and regular rhythm. No murmurs, rubs, or gallops. Pulmonary/Chest: Effort normal. Clear to auscultation bilaterally. No wheezes, rales, or rhonchi. Abdominal: Bowel sounds present. Soft, non-distended, non-tender. Ext: No lower extremity edema. No ttp of the right leg. Skin: Warm and dry. No rashes or wounds.  EKG: personally reviewed my interpretation is NSR, RBBB, and no changes from prior  Brain MRI: Background pattern of old small vessel infarctions throughout the brain. Small focus of acute infarction in the right temporal lobe just lateral and superior to the temporal horn of the right lateral ventricle. No swelling or hemorrhage.  Assessment & Plan by Problem: Active Problems:   CVA (cerebral vascular accident) [facility])  Mr. Shew is a 63 yo man with a medical history of multiple CVAs, HTN, PVD, ruptured AAA s/p emergent endovascular repair, thoracic aortic atherosclerosis, CKDIII, and GERD who presented with lightheadedness and vomiting. No focal neurological deficits on initial exam. Brain MRI showed an acute infarct in the right temporal lobe.  Right temporal lobe CVA - Patient has history of multiple CVAs, primarily cerebellar. He has had thorough work-ups for these in the past. Most recent ECHO 07/2017 showed EF 65-70% and no PFO. CTA head/neck 07/2017 showed chronic occlusion of the right MCA, but no carotid stenosis. He has known extensive atherosclerotic plaques throughout the aortic arch and proximal descending thoracic aorta, which may be the source of his recurrent embolic events. He has no history of arrhythmias.  - He is on aspirin, but is not on a statin for an unknown reason. His BP is elevated in the setting of running out of his home antihypertensive. He  did quit smoking. His last A1c was wnl. Plan - Neurology consulted, appreciate recs - Aspirin 81mg  daily - Start lipitor 20mg  daily - A1c, lipid panel - Zofran prn for nausea - Tele - ECHO  - PT/OT - NPO until passes swallow eval - Will defer other imaging like CTA head and neck to neuro  HTN: BP elevated to the 811X systolic - Hold home losartan for permissive HTN  AAA s/p repair and extensive aortic atherosclerosis: Patient follows with CT surgery. He is scheduled for a repeat CTA to monitor disease burden in a few months.   CKDIII: Kidney function appears to be at baseline.  GERD: Continue home PPI  FEN: no IV fluids, NPO until passes swallow eval, then regular diet, replace electrolytes as needed  DVT ppx: Lovenox Code status: FULL code  Dispo: Admit patient to Observation with expected length of stay less than 2 midnights.  Signed: Corinne Ports, MD 06/23/2018, 5:38 PM  Pager: [phone]

## 2018-06-23 NOTE — ED Notes (Signed)
Pt returned from CT °

## 2018-06-23 NOTE — ED Triage Notes (Signed)
[facility] reports NV x2 days, dizziness today. Dtr reported he took an old bp med today since he is out of his regular med. VS stable

## 2018-06-23 NOTE — ED Notes (Signed)
Pt made aware of the need for urine.

## 2018-06-23 NOTE — ED Notes (Signed)
Patient transported to CT 

## 2018-06-23 NOTE — ED Notes (Signed)
Dtr Madlyn Frankel [phone] This RN, the PA and the patient spoke with her.

## 2018-06-24 ENCOUNTER — Observation Stay (HOSPITAL_BASED_OUTPATIENT_CLINIC_OR_DEPARTMENT_OTHER): Payer: Medicaid Other

## 2018-06-24 DIAGNOSIS — E785 Hyperlipidemia, unspecified: Secondary | ICD-10-CM | POA: Diagnosis not present

## 2018-06-24 DIAGNOSIS — I6381 Other cerebral infarction due to occlusion or stenosis of small artery: Secondary | ICD-10-CM

## 2018-06-24 DIAGNOSIS — I639 Cerebral infarction, unspecified: Secondary | ICD-10-CM

## 2018-06-24 DIAGNOSIS — I129 Hypertensive chronic kidney disease with stage 1 through stage 4 chronic kidney disease, or unspecified chronic kidney disease: Secondary | ICD-10-CM | POA: Diagnosis not present

## 2018-06-24 DIAGNOSIS — N183 Chronic kidney disease, stage 3 (moderate): Secondary | ICD-10-CM | POA: Diagnosis not present

## 2018-06-24 DIAGNOSIS — Z7902 Long term (current) use of antithrombotics/antiplatelets: Secondary | ICD-10-CM

## 2018-06-24 MED ORDER — ATORVASTATIN CALCIUM 80 MG PO TABS: 80.0000 mg | ORAL_TABLET | Freq: Every day | ORAL | Status: DC

## 2018-06-24 MED ORDER — ATORVASTATIN CALCIUM 20 MG PO TABS: 20.0000 mg | ORAL_TABLET | Freq: Every day | ORAL | 1 refills | Status: DC

## 2018-06-24 MED ORDER — CLOPIDOGREL BISULFATE 75 MG PO TABS: 75.0000 mg | ORAL_TABLET | Freq: Every day | ORAL | 2 refills | Status: DC

## 2018-06-24 MED ORDER — CLOPIDOGREL BISULFATE 75 MG PO TABS
75.0000 mg | ORAL_TABLET | Freq: Every day | ORAL | Status: DC
  Administered 2018-06-24: 75 mg via ORAL
  Filled 2018-06-24: qty 1

## 2018-06-24 MED ORDER — ASPIRIN EC 81 MG PO TBEC: 81.0000 mg | DELAYED_RELEASE_TABLET | Freq: Every day | ORAL | 0 refills | Status: AC

## 2018-06-24 MED ORDER — ASPIRIN EC 325 MG PO TBEC: 325.0000 mg | DELAYED_RELEASE_TABLET | Freq: Every day | ORAL | 0 refills | Status: DC

## 2018-06-24 MED ORDER — SODIUM CHLORIDE 0.9 % IV BOLUS
1000.0000 mL | Freq: Once | INTRAVENOUS | Status: AC
  Administered 2018-06-24: 1000 mL via INTRAVENOUS

## 2018-06-24 MED ORDER — ATORVASTATIN CALCIUM 10 MG PO TABS: 20.0000 mg | ORAL_TABLET | Freq: Every day | ORAL | Status: DC

## 2018-06-24 MED ORDER — LOSARTAN POTASSIUM 50 MG PO TABS: 50.0000 mg | ORAL_TABLET | Freq: Every day | ORAL | 3 refills | Status: DC

## 2018-06-24 NOTE — Progress Notes (Addendum)
STROKE TEAM PROGRESS NOTE   HISTORY OF PRESENT ILLNESS (per record) Thomas [name] is an 63 y.o. male with a medical history ofmultiplestrokes, but minimal residual effect ofchronic right leg pain and bilateral lower extremity numbness(mRS 1),HTN, PVD,ruptured AAA s/p repair, and thoracic aortic atherosclerosis. On 3/29 hepresented to the ED with dizziness (which is normal for him for many years), but new onset ofvomiting. He denies that there is any "new" quality to this usual dizziness.MRI brain was done, which incidentally shows a new ischemic stroke on the right lateral temporal horn of ventricle. There is no clear onset time of stroke nor clinical symptoms, thus no stroke interventions offered at this time.   SUBJECTIVE (INTERVAL HISTORY) Pt states his dizziness and n/v is better this morning. CTA reviewed with pt explaining extensive intracranial disease and plan for DAPT. C/C of overall feeling unwell. I have updated the internal medicine team just after visit with pt today. He also complains of chronic hearing loss in the left ear. Denies true vertigo   OBJECTIVE Vitals:   06/24/18 0239 06/24/18 0414 06/24/18 0630 06/24/18 0849  BP: 106/68 93/63 96/66  108/76  Pulse: 74 72 64 71  Resp:  16  15  Temp:  97.9 F (36.6 C)  98.2 F (36.8 C)  TempSrc:  Axillary  Oral  SpO2: 96% 96%  97%  Weight:      Height:        CBC:  Recent Labs  Lab 06/23/18 1321  WBC 12.7*  NEUTROABS 6.1  HGB 14.3  HCT 44.0  MCV 79.0*  PLT 254    Basic Metabolic Panel:  Recent Labs  Lab 06/23/18 1321 06/23/18 1332  NA 135  --   K 4.8  --   CL 105  --   CO2 23  --   GLUCOSE 135*  --   BUN 21  --   CREATININE 1.53* 1.50*  CALCIUM 9.4  --     Lipid Panel:     Component Value Date/Time   CHOL 212 (H) 06/23/2018 1740   CHOL 126 11/08/2017 1313   TRIG 116 06/23/2018 1740   HDL 45 06/23/2018 1740   HDL 41 11/08/2017 1313   CHOLHDL 4.7 06/23/2018 1740   VLDL 23 06/23/2018 1740   LDLCALC  144 (H) 06/23/2018 1740   LDLCALC 64 11/08/2017 1313   HgbA1c:  Lab Results  Component Value Date   HGBA1C 5.4 06/23/2018   Urine Drug Screen:     Component Value Date/Time   LABOPIA NONE DETECTED 06/23/2018 1630   COCAINSCRNUR NONE DETECTED 06/23/2018 1630   LABBENZ NONE DETECTED 06/23/2018 1630   AMPHETMU NONE DETECTED 06/23/2018 1630   THCU NONE DETECTED 06/23/2018 1630   LABBARB NONE DETECTED 06/23/2018 1630    Alcohol Level No results found for: ETH  IMAGING   Ct Angio Head W Or Wo Contrast  Result Date: 06/23/2018 CLINICAL DATA:  Small acute infarction right temporal lobe.  Ataxia. EXAM: CT ANGIOGRAPHY HEAD AND NECK TECHNIQUE: Multidetector CT imaging of the head and neck was performed using the standard protocol during bolus administration of intravenous contrast. Multiplanar CT image reconstructions and MIPs were obtained to evaluate the vascular anatomy. Carotid stenosis measurements (when applicable) are obtained utilizing NASCET criteria, using the distal internal carotid diameter as the denominator. CONTRAST:  64mL ISOVUE-370 IOPAMIDOL (ISOVUE-370) INJECTION 76% COMPARISON:  CT and MRI studies earlier same day. Previous CT angiogram 08/16/2017 FINDINGS: CTA NECK FINDINGS Aortic arch: Advanced atherosclerotic disease of the aorta with markedly  irregular plaque. Diameter of the ascending aorta measures up to 4.1 cm. Right carotid system: Common carotid artery is patent to the bifurcation without stenosis. At the carotid bifurcation, there is atherosclerotic plaque and a small vascular web. The morphology has changed slightly since last year, but there is still no stenosis when compared to the more distal cervical ICA. Minimal diameter is 4.3 cm. Compared to a more distal cervical ICA diameter of 4 mm, there is no stenosis. Left carotid system: Common carotid artery shows mild atherosclerotic change but is widely patent to the bifurcation. At the carotid bifurcation, there is a  small focus of calcified plaque but there is no stenosis or significant irregularity. Cervical ICA is widely patent. Vertebral arteries: Left vertebral artery origin is widely patent. Right vertebral artery is occluded at its origin and reconstitutes in the mid cervical region by collaterals. This is a small vessel showing flow to the foramen magnum. Skeleton: Chronic cervical spondylosis. Other neck: No mass or lymphadenopathy. 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 Upper chest: Emphysema and chronic scarring. No acute process evident. Review of the MIP images confirms the above findings CTA HEAD FINDINGS Anterior circulation: Both internal carotid arteries are patent through the skull base and siphon regions. There is calcification in the carotid siphon regions but no stenosis greater than 20% in the siphon regions. There is narrowing of both supraclinoid internal carotid arteries. On the left, this is probably 30% or left in there is good flow within the left anterior and middle cerebral arteries. More distal branch vessels do show some atherosclerotic irregularity. On the right, the supraclinoid internal carotid artery show stenosis of 50%, but does supply the left anterior and middle cerebral arteries. There is chronic occlusion of the right M1 segment consistent. Posterior circulation: Both vertebral arteries are patent at the foramen magnum level with the left being dominant. The left supplies the basilar artery. The right vertebral artery supplies PICA but is occluded beyond that. No basilar stenosis. Superior cerebellar and posterior cerebral arteries show flow, with some distal atherosclerotic irregularity. Venous sinuses: Patent and normal. Anatomic variants: None significant. Delayed phase: No abnormal enhancement. 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 Some distal collateral opacification in the right MCA territory.  Atherosclerotic disease at both carotid bifurcations but without stenosis. The plaque morphology at the proximal right ICA has changed slightly, appearing more web-like, but there is no measurable stenosis compared to the more distal cervical ICA diameter. Advanced atherosclerotic disease of the aorta. 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 The vessel is reconstituted by cervical collaterals and shows flow as far as right PICA, but not beyond that to the basilar. The dominant left vertebral artery is widely patent to the basilar. Call report in progress. Electronically Signed   By: Nelson Chimes M.D.   On: 06/23/2018 18:32   Dg Chest 2 View  Result Date: 06/23/2018 CLINICAL DATA:  Hypertension.  Previous smoker. EXAM: CHEST - 2 VIEW COMPARISON:  Aug 15, 2017 FINDINGS: Reticular changes in the lungs are more pronounced in the left base today. The cardiomediastinal silhouette is normal. No pneumothorax. No other acute abnormalities. IMPRESSION: 1. Reticular changes in the lungs consistent with fibrotic change seen on previous CT imaging. 2. Increased haziness in the left base could represent a superimposed acute on chronic process. Recommend attention on follow-up. Electronically Signed   By: Dorise Bullion III M.D   On: 06/23/2018 19:52  Ct Head Wo Contrast  Result Date: 06/23/2018 CLINICAL DATA:  Dizziness. EXAM: CT HEAD WITHOUT CONTRAST TECHNIQUE: Contiguous axial images were obtained from the base of the skull through the vertex without intravenous contrast. COMPARISON:  CT scan of Aug 24, 2017. FINDINGS: Brain: Mild diffuse cortical atrophy is noted. Mild chronic ischemic white matter disease is noted. No mass effect or midline shift is noted. Ventricular size is within normal limits. There is no evidence of mass lesion, hemorrhage or acute infarction. Vascular: No hyperdense vessel or unexpected calcification. Skull: Normal. Negative for fracture or focal lesion. Sinuses/Orbits: No acute  finding. Other: None. IMPRESSION: Mild diffuse cortical atrophy. Mild chronic ischemic white matter disease. No acute intracranial abnormality seen. Electronically Signed   By: Marijo Conception, M.D.   On: 06/23/2018 13:54   Ct Angio Neck W And/or Wo Contrast  Result Date: 06/23/2018 CLINICAL DATA:  Small acute infarction right temporal lobe.  Ataxia. EXAM: CT ANGIOGRAPHY HEAD AND NECK TECHNIQUE: Multidetector CT imaging of the head and neck was performed using the standard protocol during bolus administration of intravenous contrast. Multiplanar CT image reconstructions and MIPs were obtained to evaluate the vascular anatomy. Carotid stenosis measurements (when applicable) are obtained utilizing NASCET criteria, using the distal internal carotid diameter as the denominator. CONTRAST:  47mL ISOVUE-370 IOPAMIDOL (ISOVUE-370) INJECTION 76% COMPARISON:  CT and MRI studies earlier same day. Previous CT angiogram 08/16/2017 FINDINGS: CTA NECK FINDINGS Aortic arch: Advanced atherosclerotic disease of the aorta with markedly irregular plaque. Diameter of the ascending aorta measures up to 4.1 cm. Right carotid system: Common carotid artery is patent to the bifurcation without stenosis. At the carotid bifurcation, there is atherosclerotic plaque and a small vascular web. The morphology has changed slightly since last year, but there is still no stenosis when compared to the more distal cervical ICA. Minimal diameter is 4.3 cm. Compared to a more distal cervical ICA diameter of 4 mm, there is no stenosis. Left carotid system: Common carotid artery shows mild atherosclerotic change but is widely patent to the bifurcation. At the carotid bifurcation, there is a small focus of calcified plaque but there is no stenosis or significant irregularity. Cervical ICA is widely patent. Vertebral arteries: Left vertebral artery origin is widely patent. Right vertebral artery is occluded at its origin and reconstitutes in the mid  cervical region by collaterals. This is a small vessel showing flow to the foramen magnum. Skeleton: Chronic cervical spondylosis. Other neck: No mass or lymphadenopathy. 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 Upper chest: Emphysema and chronic scarring. No acute process evident. Review of the MIP images confirms the above findings CTA HEAD FINDINGS Anterior circulation: Both internal carotid arteries are patent through the skull base and siphon regions. There is calcification in the carotid siphon regions but no stenosis greater than 20% in the siphon regions. There is narrowing of both supraclinoid internal carotid arteries. On the left, this is probably 30% or left in there is good flow within the left anterior and middle cerebral arteries. More distal branch vessels do show some atherosclerotic irregularity. On the right, the supraclinoid internal carotid artery show stenosis of 50%, but does supply the left anterior and middle cerebral arteries. There is chronic occlusion of the right M1 segment consistent. Posterior circulation: Both vertebral arteries are patent at the foramen magnum level with the left being dominant. The left supplies the basilar artery. The right vertebral artery supplies PICA but is occluded beyond that. No basilar stenosis. Superior cerebellar and posterior  cerebral arteries show flow, with some distal atherosclerotic irregularity. Venous sinuses: Patent and normal. Anatomic variants: None significant. Delayed phase: No abnormal enhancement. 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 Some distal collateral opacification in the right MCA territory. Atherosclerotic disease at both carotid bifurcations but without stenosis. The plaque morphology at the proximal right ICA has changed slightly, appearing more web-like, but there is no measurable stenosis compared to the more distal cervical ICA diameter. Advanced  atherosclerotic disease of the aorta. 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 The vessel is reconstituted by cervical collaterals and shows flow as far as right PICA, but not beyond that to the basilar. The dominant left vertebral artery is widely patent to the basilar. Call report in progress. Electronically Signed   By: Nelson Chimes M.D.   On: 06/23/2018 18:32   Mr Brain Wo Contrast  Result Date: 06/23/2018 CLINICAL DATA:  Nausea and vomiting over the last 2 days. Dizziness. EXAM: MRI HEAD WITHOUT CONTRAST TECHNIQUE: Multiplanar, multiecho pulse sequences of the brain and surrounding structures were obtained without intravenous contrast. COMPARISON:  Head CT same day.  MRI 08/15/2017. FINDINGS: Brain: Diffusion imaging shows a small focus of acute infarction in the right temporal lobe just lateral and superior to the temporal horn of the right lateral ventricle. No swelling or hemorrhage. No other acute brain finding. Elsewhere, there chronic small-vessel ischemic changes affecting the brainstem and cerebellum. Old small vessel infarctions of the thalami, right more than left, the basal ganglia and the cerebral hemispheric white matter. Scattered foci of hemosiderin deposition related to old small vessel infarctions. No large vessel territory infarction. No mass lesion, hemorrhage, hydrocephalus or extra-axial collection. Vascular: Both internal carotid arteries show antegrade flow. No apparent flow in the right vertebral artery. This is a chronic finding. Skull and upper cervical spine: Negative Sinuses/Orbits: Mild seasonal mucosal inflammation. No advanced sinusitis. Orbits negative. Other: None IMPRESSION: Background pattern of old small vessel infarctions throughout the brain. Small focus of acute infarction in the right temporal lobe just lateral and superior to the temporal horn of the right lateral ventricle. No swelling or hemorrhage. Electronically Signed   By: Nelson Chimes M.D.   On:  06/23/2018 16:22    PHYSICAL EXAM Blood pressure 108/76, pulse 71, temperature 98.2 F (36.8 C), temperature source Oral, resp. rate 15, height 5\' 7"  (1.702 m), weight 59 kg, SpO2 97 %.  General -frail cachectic elderly Asian male. no acute distress; fatigued Heart - Regular rate and rhythm - no murmer appreciated Lungs - Clear to auscultation  Abdomen - Soft - non tender Extremities - Distal pulses intact - no edema Skin - Warm and dry   Neurologic Examination:  Mental Status: Alert, oriented, thought content appropriate.  Speech fluent without evidence of aphasia. Able to follow 3 step commands without difficulty. Cranial Nerves: II: Discs not visualized; Visual fields grossly normal, pupils equal, round, reactive to light III,IV, VI: ptosis not present, extra-ocular motions intact bilaterally V,VII: smile symmetric, facial light touch sensation normal bilaterally VIII: hearing diminished slightly on the left. IX,X: gag reflex present XI:   shoulder shrug limited on the left due to pain XII: midline tongue extension Motor: RUE - 5/5  LUE - 5/5   RLE - 5/5                                             LLE - 5/5 Tone and bulk:normal tone throughout; no atrophy noted Sensory: Intact to light touch in all extremitiesexcept notes that the left thigh has chronic numbness/tingling. Deep Tendon Reflexes: 2+ and symmetric throughout Plantars: Right: downgoing                                Left: downgoing Cerebellar: normal finger-to-nose and normal heel-to-shin test Gait: deferred at this time.   HOME MEDICATIONS:  Medications Prior to Admission  Medication Sig Dispense Refill  . aspirin EC 325 MG tablet Take 1 tablet (325 mg total) by mouth daily. 30 tablet 0  . losartan (COZAAR) 50 MG tablet Take 1 tablet (50 mg total) by mouth daily. (Patient not taking: Reported on 06/23/2018) 90 tablet 3  . omeprazole (PRILOSEC) 20 MG capsule TAKE 1  CAPSULE (20 MG TOTAL) BY MOUTH DAILY. (Patient not taking: Reported on 06/23/2018) 30 capsule 2      HOSPITAL MEDICATIONS:  . aspirin EC  81 mg Oral Daily  . atorvastatin  20 mg Oral q1800  . clopidogrel  75 mg Oral Daily  . enoxaparin (LOVENOX) injection  30 mg Subcutaneous Q24H    ALLERGIES No Known Allergies  PAST MEDICAL HISTORY Past Medical History:  Diagnosis Date  . Hypertension   . Stroke [facility])     SURGICAL HISTORY Past Surgical History:  Procedure Laterality Date  . ENDOVASCULAR STENT INSERTION N/A 07/01/2017   Procedure: ENDOVASCULAR STENT GRAFT INSERTION;  Surgeon: Serafina Mitchell, MD;  Location: [facility] OR;  Service: Vascular;  Laterality: N/A;  . THROMBECTOMY FEMORAL ARTERY Bilateral 07/01/2017   Procedure: Bilateral IlioFemoral Thrombectomies;  Surgeon: Serafina Mitchell, MD;  Location: [place];  Service: Vascular;  Laterality: Bilateral;    FAMILY HISTORY History reviewed. No pertinent family history.  SOCIAL HISTORY  reports that he quit smoking about 11 months ago. His smoking use included cigarettes. He has never used smokeless tobacco. He reports that he does not drink alcohol or use drugs.  ASSESSMENT/PLAN 63 y.o. male withhistory ofmultiplestrokes, but minimal residual effect ofchronic right leg pain and bilateral lower extremity numbness(mRS 1),HTN, PVD,ruptured AAA s/p repair, and thoracic aortic atherosclerosis. On 06/23/18 hepresented to the ED with dizziness (which is normal for him for many years), but new onset ofvomiting that started this morning. He denies that there is any "new" quality to this usual dizziness.The dizziness improves when he closes his eyes. He denies vision changes or headache. He had 2-3 episodes of vomiting at home and a few more episodes in the hospital. Hestates he feelsgenerally unwell and generalweakness, but no focal weakness or falls. He takes full dose aspirin daily.He did not receive IV t-PA due to unknown time onset/no  symptoms.   Of note he was on Coumadin for 3 months for soft plaque, difficult to control INR and was discontinued and started on aspirin only after that.  Stroke:  right lateral temporal horn of ventricle.likely secondary to failure of collaterals circulation with known chronic right MCA occlusion with dehydration from nausea vomiting and hypotension secondary to GI acute illness.  Resultant: asymptomatic tiny right temporal white matter lacunar infarct  CT  head- neg  MRI head -new ischemic stroke on the right lateral temporal horn of ventricle.    CTA H&N - severe intracranial disease throughout. Rt vert occusion. Chronic M1 occlusion, bilat carotid athro, but no stenosis there.   2D Echo - 55% wnl  LDL - 144  HgbA1c - pending  UDS - neg  VTE prophylaxis - Lovenox  Diet heart healthy  ASA 325mg  prior to admission, now on ASA 81mg  + Plavix 75mg  for 3 months, then Plavix only  Patient counseled to be compliant with his antithrombotic medications  Ongoing aggressive stroke risk factor management  Therapy recommendations:  pending  Disposition:  Pending  Hypertension  Stable; he was actually hypotensive upon admit in setting of n/v/dehydration . Permissive hypertension (OK if < 220/120) but gradually normalize in 5-7 days; IVF to increase BP given degree of stenosis.  . Avoid hypotension or sudden drops in BP . Long-term BP goal normotensive  Hyperlipidemia  Lipid lowering medication PTA:  none  LDL 144, goal < 70  Current lipid lowering medication: Pt has had multiple issues with taking statins in past such as severe myalgias. Would recommend a high intensity statin, but pt is agreeable to trailing low dose lipitor 20mg    Continue statin at discharge  Diabetes  HgbA1c pending, goal < 7.0  Unknown DM2 dx, has been hyperglycemic here.  Other Stroke Risk Factors  Hx stroke/TIA  Family hx stroke   Other Active Problems  N/V- tx symptomatically, hydrate.  per primary team   Hospital day # 0  Desiree Metzger-Cihelka, ARNP-C, ANVP-BC  I have personally obtained history,examined this patient, reviewed notes, independently viewed imaging studies, participated in medical decision making and plan of care.ROS completed by me personally and pertinent positives fully documented  I have made any additions or clarifications directly to the above note. Agree with note above. He presented with exacerbation of chronic dizziness with nausea vomiting and hypertension and MRI shows a tiny incidental right temporal white matter infarct likely due to failure of collaterals circulation with chronic right MCA occlusion and dehydration and hypertension. Recommend aspirin and Plavix for 3 months followed by Plavix alone. Aggressive risk factor modification. No family available at the bedside for discussion. Greater than 50% time during this 25 minute visit was spent on counseling and coordination of care about his incidental stroke and chronic right MCA occlusion and answering questions.stroke team will sign off. Follow-up as an outpatient in stroke clinic in 6 weeks. Antony Contras, MD Medical Director [facility] Pager: [phone] 06/24/2018 12:56 PM  To contact Stroke Continuity provider, please refer to [email or web address] After hours, contact General Neurology

## 2018-06-24 NOTE — Evaluation (Signed)
Speech Language Pathology Evaluation Patient Details Name: Thomas [name] MRN: [id number] DOB: 11-21-1955 Today's Date: 06/24/2018 Time: 9798-9211 SLP Time Calculation (min) (ACUTE ONLY): 25 min  Problem List:  Patient Active Problem List   Diagnosis Date Noted  . Acute ischemic stroke ([place]) 06/23/2018  . Thoracic aortic aneurysm without rupture ([place])   . Chronic anticoagulation   . Chronic ischemic vertebrobasilar artery cerebellar stroke 08/18/2017  . Gait disorder   . Paraparesis ([place])   . Hypertension 08/15/2017  . Gait instability 08/15/2017  . Pelvic pain 08/15/2017  . Chronic kidney disease 08/15/2017  . Abnormal CT scan, pelvis 08/15/2017  . Meniere disease, left 08/08/2017  . Acute respiratory failure with hypoxia ([place])   . Acute blood loss anemia 07/02/2017  . S/P AAA repair   . LLQ abdominal pain   . Ruptured abdominal aortic aneurysm (AAA) ([place])   . Hypotension   . Bacteremia due to Streptococcus 06/30/2017  . Chest pain 06/28/2017  . CAP (community acquired pneumonia): Clinical 06/28/2017  . Acute renal failure superimposed on stage 3 chronic kidney disease ([place]) 06/28/2017  . Dehydration 06/28/2017  . Leukocytosis 06/28/2017  . Elevated troponin 06/28/2017  . Dizziness 06/28/2017  . Vertigo 06/28/2017  . Acute pericarditis: Probable 06/28/2017   Past Medical History:  Past Medical History:  Diagnosis Date  . Hypertension   . Stroke [facility])    Past Surgical History:  Past Surgical History:  Procedure Laterality Date  . ENDOVASCULAR STENT INSERTION N/A 07/01/2017   Procedure: ENDOVASCULAR STENT GRAFT INSERTION;  Surgeon: Serafina Mitchell, MD;  Location: [facility] OR;  Service: Vascular;  Laterality: N/A;  . THROMBECTOMY FEMORAL ARTERY Bilateral 07/01/2017   Procedure: Bilateral IlioFemoral Thrombectomies;  Surgeon: Serafina Mitchell, MD;  Location: [facility] OR;  Service: Vascular;  Laterality: Bilateral;   HPI:  Patient is a 63 y.o. male with PMH: multiple CVA's, HTN, PVD,  ruptured AAA s/p emergent endovascular repair, thoracic aortic atheroscleosis, CKD III, and GERD, who presented to ED with lightheadedness and vomitting. MRI of brain showed acute infarct in right temporal lobe   Assessment / Plan / Recommendation Clinical Impression  Patient presents with cognitive-linguistic and speech functioning that are all WNL. Patient does not exhibit any deficits in memory, awareness, reasoning, safety awareness, problem solving and does not exhibit any deficits in language expression or comprehension.     SLP Assessment  SLP Recommendation/Assessment: Patient does not need any further Speech Lanaguage Pathology Services SLP Visit Diagnosis: Cognitive communication deficit (R41.841)    Follow Up Recommendations  None    Frequency and Duration   N/A        SLP Evaluation Cognition  Overall Cognitive Status: Within Functional Limits for tasks assessed Orientation Level: Oriented X4       Comprehension  Auditory Comprehension Overall Auditory Comprehension: Appears within functional limits for tasks assessed    Expression Expression Primary Mode of Expression: Verbal Verbal Expression Overall Verbal Expression: Appears within functional limits for tasks assessed   Oral / Motor  Oral Motor/Sensory Function Overall Oral Motor/Sensory Function: Within functional limits Motor Speech Overall Motor Speech: Appears within functional limits for tasks assessed   GO                   Sonia Baller, MA, CCC-SLP Speech Therapy [facility] Acute Rehab Pager: [phone]

## 2018-06-24 NOTE — Discharge Summary (Signed)
Name: Thomas [name] MRN: [id number] DOB: 1955/06/20 63 y.o. PCP: Gildardo Pounds, NP  Date of Admission: 06/23/2018 12:34 PM Date of Discharge: 06/24/2018 Attending Physician: Aldine Contes, MD  Discharge Diagnosis: 1. Acute ischemic right lateral temporal horne white matter lacunar infarct 2. HLD 3. HTN  Discharge Medications: Allergies as of 06/24/2018   No Known Allergies     Medication List    TAKE these medications   aspirin EC 325 MG tablet Take 1 tablet (325 mg total) by mouth daily.   atorvastatin 20 MG tablet Commonly known as:  LIPITOR Take 1 tablet (20 mg total) by mouth daily at 6 PM.   clopidogrel 75 MG tablet Commonly known as:  PLAVIX Take 1 tablet (75 mg total) by mouth daily. Start taking on:  June 25, 2018   losartan 50 MG tablet Commonly known as:  COZAAR Take 1 tablet (50 mg total) by mouth daily.   omeprazole 20 MG capsule Commonly known as:  PRILOSEC TAKE 1 CAPSULE (20 MG TOTAL) BY MOUTH DAILY.       Disposition and follow-up:   ThomasThomas [name] was discharged from [facility] in Stable condition.  At the hospital follow up visit please address:  1. A. Acute CVA: Patient will need to continue ASA 81mg  daily for 90 days and plavix 75mg  daily for life.  B. HLD: Started on low dose Atorvastatin 20mg  daily due to chart records indicating statin induced myopathy at high dose and affordability concerns with crestor C. HTN: Will advise resuming his Losartan in 2-3 days and gradual return to normal BP in 5-7 days with goal of <140/90.  2.  Labs / imaging needed at time of follow-up: n/a  3.  Pending labs/ test needing follow-up: n/a  Follow-up Appointments: Follow-up Information    Gildardo Pounds, NP. Schedule an appointment as soon as possible for a visit in 1 week(s).   Specialty:  Nurse Practitioner Contact information: [place]  [phone]          Hospital Course by problem list:  Acute ischemic right lateral temporal horne white matter lacunar infarct [name] is a 63 yo man with a medical history of multiple CVAs, HTN, PVD, ruptured AAA s/p emergent endovascular repair, thoracic aortic atherosclerosis, CKDIII, and GERD who presented to the ED with lightheadedness, new quality to his chronic dizziness and vomiting that started the morning of admission. CT of the head was unremarkable but MRI revealed an infarct. There were no new notable residual focal deficits on exam. He had improved but persistant chronic dizziness on discharge. He was seen by neurology who recommended DAPT w/ ASA 81mg  daily for 90 days and Plavix 75mg  daily for life. Given the ASA 325mg  failure we agreed with the recommendation and discharged the patient accordingly after his Echo with bubble study failed to reveal an interveneable process. There is severe intracranial atherosclerotic disease that will require intense medical therapy and close adherence with his antiplatelet agents and statins to reduce his risk of CVA recurrence.   Hyperlipidemia: Elevated LDL to 144 from 64 7 months prior. Has had issues with statin induced myopathy as per chart review. We will start atorvastatin 20mg  cautiously and recommend uptitration if tolerated by his PCP.  Hypertension: Recommend that this be allowed to return to near normal in the upcoming 5-7 days with restarting his Losartan in 2-3 days and a goal of <140/90.  Discharge Vitals:   BP 106/75 (BP Location: Right  Arm)   Pulse 68   Temp 98.2 F (36.8 C) (Oral)   Resp 15   Ht 5\' 7"  (1.702 m)   Wt 59 kg   SpO2 96%   BMI 20.36 kg/m   Pertinent Labs, Studies, and Procedures:  CBC Latest Ref Rng & Units 06/23/2018 08/27/2017 08/26/2017  WBC 4.0 - 10.5 K/uL 12.7(H) 11.2(H) 11.4(H)  Hemoglobin 13.0 - 17.0 g/dL 14.3 12.8(L) 12.0(L)  Hematocrit 39.0 - 52.0 % 44.0 39.7 38.5(L)  Platelets 150 - 400 K/uL 364 373 356   CMP Latest Ref Rng & Units 06/23/2018 06/23/2018  09/03/2017  Glucose 70 - 99 mg/dL - 135(H) 108(H)  BUN 8 - 23 mg/dL - 21 25  Creatinine 0.61 - 1.24 mg/dL 1.50(H) 1.53(H) 1.40(H)  Sodium 135 - 145 mmol/L - 135 136  Potassium 3.5 - 5.1 mmol/L - 4.8 4.4  Chloride 98 - 111 mmol/L - 105 101  CO2 22 - 32 mmol/L - 23 23  Calcium 8.9 - 10.3 mg/dL - 9.4 9.2  Total Protein 6.5 - 8.1 g/dL - 7.7 -  Total Bilirubin 0.3 - 1.2 mg/dL - 0.9 -  Alkaline Phos 38 - 126 U/L - 94 -  AST 15 - 41 U/L - 30 -  ALT 0 - 44 U/L - 18 -    Ref Range & Units 1d ago 14mo ago  Hgb A1c MFr Bld 4.8 - 5.6 % 5.4  5.6 CM  Comment: (NOTE)  Pre diabetes:     5.7%-6.4%  Diabetes:       >6.4%  Glycemic control for  <7.0%  adults with diabetes   Mean Plasma Glucose mg/dL 108.28  114.02 CM  Comment: Performed at [facility], [address], [place], [place] [address]   Ref Range & Units 1d ago   Cholesterol 0 - 200 mg/dL 212High    Triglycerides <150 mg/dL 116   HDL >40 mg/dL 45   Total CHOL/HDL Ratio RATIO 4.7   VLDL 0 - 40 mg/dL 23   LDL Cholesterol 0 - 99 mg/dL 144High      MRI Brain: IMPRESSION: Background pattern of old small vessel infarctions throughout the brain. Small focus of acute infarction in the right temporal lobe just lateral and superior to the temporal horn of the right lateral ventricle. No swelling or hemorrhage.  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 Some distal collateral opacification in the right MCA territory. Atherosclerotic disease at both carotid bifurcations but without stenosis. The plaque morphology at the proximal right ICA has changed slightly, appearing more web-like, but there is no measurable stenosis compared to the more distal cervical ICA diameter. Advanced atherosclerotic disease of the aorta. 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 The vessel is reconstituted by cervical collaterals and shows flow as far as right PICA, but not beyond that to the  basilar. The dominant left vertebral artery is widely patent to the basilar.  Discharge Instructions: Discharge Instructions    Call MD for:  difficulty breathing, headache or visual disturbances   Complete by:  As directed    Call MD for:  persistant dizziness or light-headedness   Complete by:  As directed    Call MD for:  persistant nausea and vomiting   Complete by:  As directed    Diet - low sodium heart healthy   Complete by:  As directed    Discharge instructions   Complete by:  As directed    Thomas [name],  It was a pleasure taking care of you. You were treated in the hospital for a stroke. You will take aspirin and Plavix daily for the next 3 months. After 3 months, you will stop the aspirin and continue taking Plavix every day for the rest of your life.  Please start taking your blood pressure medication (Losartan) in 2 days. It will be important to make sure your blood pressure is well controlled.  You will also start taking a cholesterol medication every day to decrease your risk of stroke in the future. We have started you at a low dose to decrease side effects you had in the past.  Please make an appointment to follow-up with your primary doctor in the next week for hospital follow-up.   Take care! Dr. Koleen Distance   Increase activity slowly   Complete by:  As directed      Signed: Kathi Ludwig, MD 06/24/2018, 1:03 PM   Pager: [phone]

## 2018-06-24 NOTE — Progress Notes (Signed)
Pt discharge education and instructions completed with pt; pt voices understanding and denies any questions. Pt IV and telemetry removed; pt discharged home with daughter to come pick him up. Pt to pick up electronically sent prescriptions from preferred pharmacy on file. Pt transported off unit via wheelchair with belongings to the side. Delia Heady RN

## 2018-06-24 NOTE — TOC Initial Note (Signed)
Transition of Care [facility]) - Initial/Assessment Note    Patient Details  Name: Ezekiel Menzer MRN: [id number] Date of Birth: 09/19/1955  Transition of Care [facility]) CM/SW Contact:    [name], RN Phone Number: 06/24/2018, 1:32 PM  Clinical Narrative:                   Expected Discharge Plan: [place] Barriers to Discharge: Continued Medical Work up   Patient Goals and CMS Choice   CMS Medicare.gov Compare Post Acute Care list provided to:: Patient Choice offered to / list presented to : Patient  Expected Discharge Plan and Services Expected Discharge Plan: [place]   Discharge Planning Services: CM Consult Post Acute Care Choice: Home Health   Expected Discharge Date: 06/24/18                   HH Arranged: PT HH Agency: [facility]  Prior Living Arrangements/Services   Lives with:: Other (Comment)(daughter, cousin and cousin wife) Patient language and need for interpreter reviewed:: Yes(no need--pt did well with English) Do you feel safe going back to the place where you live?: Yes      Need for Family Participation in Patient Care: Yes (Comment)(24 hour supervision recommended) Care giver support system in place?: Yes (comment)(Pts Cousin's wife is home 24 hours/ day) Current home services: DME(walker, shower seat) Criminal Activity/Legal Involvement Pertinent to Current Situation/Hospitalization: No - Comment as needed  Activities of Daily Living      Permission Sought/Granted                  Emotional Assessment Appearance:: Appears stated age Attitude/Demeanor/Rapport: Engaged Affect (typically observed): Accepting, Appropriate, Pleasant Orientation: : Oriented to Self, Oriented to Place, Oriented to  Time, Oriented to Situation   Psych Involvement: No (comment)  Admission diagnosis:  Dizziness [R42] Ill [R69] Acute ischemic stroke ([place]) [I63.9] Nausea and vomiting in adult patient [R11.2] Patient  Active Problem List   Diagnosis Date Noted  . Acute ischemic stroke ([place]) 06/23/2018  . Thoracic aortic aneurysm without rupture ([place])   . Chronic anticoagulation   . Chronic ischemic vertebrobasilar artery cerebellar stroke 08/18/2017  . Gait disorder   . Paraparesis ([place])   . Hypertension 08/15/2017  . Gait instability 08/15/2017  . Pelvic pain 08/15/2017  . Chronic kidney disease 08/15/2017  . Abnormal CT scan, pelvis 08/15/2017  . Meniere disease, left 08/08/2017  . Acute respiratory failure with hypoxia ([place])   . Acute blood loss anemia 07/02/2017  . S/P AAA repair   . LLQ abdominal pain   . Ruptured abdominal aortic aneurysm (AAA) ([place])   . Hypotension   . Bacteremia due to Streptococcus 06/30/2017  . Chest pain 06/28/2017  . CAP (community acquired pneumonia): Clinical 06/28/2017  . Acute renal failure superimposed on stage 3 chronic kidney disease ([place]) 06/28/2017  . Dehydration 06/28/2017  . Leukocytosis 06/28/2017  . Elevated troponin 06/28/2017  . Dizziness 06/28/2017  . Vertigo 06/28/2017  . Acute pericarditis: Probable 06/28/2017   PCP:  Gildardo Pounds, NP Pharmacy:   [facility], [place] [address] [place] [address] Phone: [phone] Fax: [phone]     Social Determinants of Health (SDOH) Interventions    Readmission Risk Interventions No flowsheet data found.

## 2018-06-24 NOTE — Evaluation (Addendum)
Occupational Therapy Evaluation Patient Details Name: Thomas [name] MRN: [id number] DOB: Jul 03, 1955 Today's Date: 06/24/2018    History of Present Illness Thomas [name] is a 63 yo man with a medical history of multiple CVAs, HTN, PVD, ruptured AAA s/p emergent endovascular repair, thoracic aortic atherosclerosis, CKDIII, and GERD who presented with lightheadedness and vomiting. No focal neurological deficits on initial exam. Brain MRI showed an acute infarct in the right temporal lobe.   Clinical Impression   This 63 y/o male presents with the above. PTA pt reports he was performing ADL and functional mobility independently. Pt performing functional mobility within room using RW with overall minguard assist; demonstrating toileting and standing grooming ADL with minguard assist, requires minguard-minA for LB ADL. Pt reports improvements in dizziness today compared to initial admission and rates it as a 5/10, denies worsening of symptoms with standing/room level activity. No nystagmus or visual deficits noted with assessment though will continue to assess/monitor. Pt reports he lives with family and at least one family member is typically home most of the day. Pt will benefit from continued acute OT services to maximize his safety and independence with ADL and mobility. Will follow.     Follow Up Recommendations  No OT follow up;Supervision/Assistance - 24 hour    Equipment Recommendations  None recommended by OT(pt's DME needs are met)           Precautions / Restrictions Precautions Precautions: Fall Restrictions Weight Bearing Restrictions: No      Mobility Bed Mobility Overal bed mobility: Needs Assistance Bed Mobility: Supine to Sit;Sit to Supine     Supine to sit: Min guard Sit to supine: Supervision   General bed mobility comments: for general safety, no physical assist required  Transfers Overall transfer level: Needs assistance Equipment used: Rolling walker (2  wheeled) Transfers: Sit to/from Stand Sit to Stand: Min guard         General transfer comment: minguard for safety and immediate standing balance; no physical assist required    Balance Overall balance assessment: Needs assistance Sitting-balance support: Feet supported Sitting balance-Leahy Scale: Good     Standing balance support: During functional activity;Bilateral upper extremity supported;No upper extremity supported Standing balance-Leahy Scale: Fair Standing balance comment: able to static stand without UE support and minguard assist                           ADL either performed or assessed with clinical judgement   ADL Overall ADL's : Needs assistance/impaired Eating/Feeding: Set up;Sitting   Grooming: Wash/dry hands;Min guard;Standing Grooming Details (indicate cue type and reason): minguard for standing balance Upper Body Bathing: Set up;Sitting   Lower Body Bathing: Min guard;Sit to/from stand   Upper Body Dressing : Set up;Sitting   Lower Body Dressing: Min guard;Minimal assistance;Sit to/from stand Lower Body Dressing Details (indicate cue type and reason): pt reaching to adjust his socks seated EOB; minguard-light minA for standing balance Toilet Transfer: Min guard;Ambulation;RW   Toileting- Water quality scientist and Hygiene: Min guard;Sit to/from stand;Sitting/lateral lean Toileting - Clothing Manipulation Details (indicate cue type and reason): pt performing peri-care seated via lateral lean after voiding bladder; completing clothing management (gown and underwear) with minguard assist     Functional mobility during ADLs: Min guard;Rolling walker General ADL Comments: pt with generalized weakness, reports dizziness 5/10 today but reports has improved from initial admission      Vision Baseline Vision/History: Wears glasses Wears Glasses: At all times Patient Visual Report:  Other (comment)(dizziness but no other changes ) Vision  Assessment?: Yes Eye Alignment: Within Functional Limits Ocular Range of Motion: Within Functional Limits Alignment/Gaze Preference: Within Defined Limits Tracking/Visual Pursuits: Able to track stimulus in all quads without difficulty(no nystagmus noted with tracking )     Perception     Praxis      Pertinent Vitals/Pain Pain Assessment: No/denies pain     Hand Dominance     Extremity/Trunk Assessment Upper Extremity Assessment Upper Extremity Assessment: Generalized weakness   Lower Extremity Assessment Lower Extremity Assessment: Defer to PT evaluation       Communication Communication Communication: No difficulties(mumbled/muffled speech as pt wearing mask)   Cognition Arousal/Alertness: Awake/alert Behavior During Therapy: WFL for tasks assessed/performed Overall Cognitive Status: Within Functional Limits for tasks assessed                                 General Comments: for basic functional tasks assessed today   General Comments       Exercises     Shoulder Instructions      Home Living Family/patient expects to be discharged to:: Private residence Living Arrangements: Other relatives(daughter, cousin, cousin's spouse) Available Help at Discharge: Family;Available 24 hours/day(reports cousin's spouse home all the time) Type of Home: House Home Access: Stairs to enter [employer] of Steps: 2-3 Entrance Stairs-Rails: Right Home Layout: One level     Bathroom Shower/Tub: Teacher, early years/pre: Standard     Home Equipment: Cane - single point;Walker - 2 wheels;Wheelchair - manual;Bedside commode          Prior Functioning/Environment Level of Independence: Independent        Comments: pt reports he initially was using DME after hospital/rehab stay last summer, prior to this admission was back to independence        OT Problem List: Decreased strength;Decreased range of motion;Decreased activity  tolerance;Impaired balance (sitting and/or standing)      OT Treatment/Interventions: Self-care/ADL training;Neuromuscular education;Therapeutic exercise;DME and/or AE instruction;Therapeutic activities;Visual/perceptual remediation/compensation;Patient/family education;Balance training    OT Goals(Current goals can be found in the care plan section) Acute Rehab OT Goals Patient Stated Goal: home, regain strength OT Goal Formulation: With patient Time For Goal Achievement: 07/08/18 Potential to Achieve Goals: Good  OT Frequency: Min 2X/week   Barriers to D/C:            Co-evaluation              AM-PAC OT "6 Clicks" Daily Activity     Outcome Measure Help from another person eating meals?: None Help from another person taking care of personal grooming?: None Help from another person toileting, which includes using toliet, bedpan, or urinal?: A Little Help from another person bathing (including washing, rinsing, drying)?: A Little Help from another person to put on and taking off regular upper body clothing?: None Help from another person to put on and taking off regular lower body clothing?: A Little 6 Click Score: 21   End of Session Equipment Utilized During Treatment: Gait belt;Rolling walker Nurse Communication: Mobility status  Activity Tolerance: Patient tolerated treatment well Patient left: in bed;with call bell/phone within reach;with bed alarm set  OT Visit Diagnosis: Unsteadiness on feet (R26.81);Muscle weakness (generalized) (M62.81)                Time: 1014-1030 OT Time Calculation (min): 16 min Charges:  OT General Charges $OT Visit: 1 Visit OT  Evaluation $OT Eval Moderate Complexity: 1 Mod  Lou Cal, OT [employer] Pager [phone] Office [phone]   Raymondo Band 06/24/2018, 10:43 AM

## 2018-06-24 NOTE — Progress Notes (Signed)
   Subjective: No overnight events. The patient reports that he feels better this morning, but continues to have lightheadedness. He has not yet gotten out of bed. He ate some crackers for dinner last night and did not have any nausea or vomiting. He is hungry for breakfast this morning.  He does endorse some left upper arm soreness with movement. The plan for PT/OT today was discussed with the patient.   Objective:  Vital signs in last 24 hours: Vitals:   06/23/18 2221 06/24/18 0025 06/24/18 0239 06/24/18 0414  BP: 94/70 98/67 106/68 93/63  Pulse: 67 65 74 72  Resp: 14   16  Temp:  97.9 F (36.6 C)  97.9 F (36.6 C)  TempSrc:  Axillary  Axillary  SpO2:  95% 96% 96%  Weight:      Height:       Gen: alert and oriented, no distress Neuro: CN II-XII intact, 5/5 strength throughout, mild bilateral lower extremity numbness which is chronic  CV: RRR, no murmurs Pulm: CTAB, normal effort on room air Abd: soft, non-distended, non-tender  Assessment/Plan:  Active Problems:   CVA (cerebral vascular accident) ([place])  1. Right temporal lobe CVA - Patient clinically improved although dizziness is persistent - LDL 144; initiated on Lipitor 20 mg, as he has not tolerated high intensity statin in the past - appreciate neuro following; will be on DAPT x3 months followed by Plavix alone  - ECHO today  - PT/OT eval today - Encourage PO intake, zofran prn for nausea - patient will be stable for discharge later today once stroke work-up is complete   2. HTN - holding home Losartan to allow permissive HTN in the setting of acute CVA - will resume over the next few days   - blood pressures are on the softer side today; given his persistent dizziness will give another liter of fluids   Dispo: Anticipated discharge in approximately 0-1 day(s).   Delice Bison, DO 06/24/2018, 6:34 AM Pager: [phone]

## 2018-06-24 NOTE — TOC Transition Note (Signed)
Transition of Care [facility]) - CM/SW Discharge Note   Patient Details  Name: Thomas [name] MRN: [id number] Date of Birth: 05/20/1955  Transition of Care [facility]) CM/SW Contact:  [name], RN Phone Number: 06/24/2018, 1:33 PM   Clinical Narrative:    Pt's daughter can provide transport home.   Final next level of care: Home w Home Health Services Barriers to Discharge: No Barriers Identified   Patient Goals and CMS Choice   CMS Medicare.gov Compare Post Acute Care list provided to:: Patient Choice offered to / list presented to : Patient  Discharge Placement                       Discharge Plan and Services   Discharge Planning Services: CM Consult Post Acute Care Choice: Home Health              HH Arranged: PT [facility] Agency: [facility] with [facility] accepted the referral.   Social Determinants of Health (SDOH) Interventions  Daughter provides transportation. Pt states ran out of meds recently d/t not being able to get clinic appt.    Readmission Risk Interventions No flowsheet data found.

## 2018-06-24 NOTE — Progress Notes (Signed)
  Echocardiogram 2D Echocardiogram has been performed.  Technically difficult to perform bubble study. Patient became uncomfortable in LLD position due to shoulder pain.   Thomas [name] L Androw 06/24/2018, 9:38 AM

## 2018-06-24 NOTE — Evaluation (Signed)
Physical Therapy Evaluation Patient Details Name: Thomas [name] MRN: [id number] DOB: Aug 14, 1955 Today's Date: 06/24/2018   History of Present Illness  Thomas [name] is a 63 yo man with a medical history of multiple CVAs, HTN, PVD, ruptured AAA s/p emergent endovascular repair, thoracic aortic atherosclerosis, CKDIII, and GERD who presented with lightheadedness and vomiting. No focal neurological deficits on initial exam. Brain MRI showed an acute infarct in the right temporal lobe.  Clinical Impression  PTA pt living with daughter and cousin/cousin's wife, independent with mobility and ADLs, on longer works due to prior CVA. Pt is limited in safe mobility by increased fatigue, decreased R LE strength, and mobility. PT recommends HHPT level rehab at discharge to address decreased strength and endurance. PT will continue to follow acute.     Follow Up Recommendations Home health PT;Supervision/Assistance - 24 hour    Equipment Recommendations  None recommended by PT    Recommendations for Other Services       Precautions / Restrictions Precautions Precautions: Fall Restrictions Weight Bearing Restrictions: No      Mobility  Bed Mobility Overal bed mobility: Needs Assistance Bed Mobility: Supine to Sit;Sit to Supine     Supine to sit: Modified independent (Device/Increase time) Sit to supine: Modified independent (Device/Increase time)   General bed mobility comments: for general safety, no physical assist required  Transfers Overall transfer level: Needs assistance Equipment used: Rolling walker (2 wheeled) Transfers: Sit to/from Stand Sit to Stand: Min guard         General transfer comment: minguard for safety and immediate standing balance; no physical assist required  Ambulation/Gait Ambulation/Gait assistance: Min guard Gait Distance (Feet): 40 Feet Assistive device: Rolling walker (2 wheeled) Gait Pattern/deviations: Step-through pattern;Decreased step length -  right;Decreased step length - left;Shuffle;Trunk flexed Gait velocity: slowed Gait velocity interpretation: <1.31 ft/sec, indicative of household ambulator General Gait Details: min guard for safety with slow, shuffling gait, increased fatigue with distance     Modified Rankin (Stroke Patients Only) Modified Rankin (Stroke Patients Only) Pre-Morbid Rankin Score: Slight disability Modified Rankin: Moderate disability     Balance Overall balance assessment: Needs assistance Sitting-balance support: Feet supported Sitting balance-Leahy Scale: Good     Standing balance support: During functional activity;Bilateral upper extremity supported;No upper extremity supported Standing balance-Leahy Scale: Fair Standing balance comment: able to static stand without UE support and minguard assist                             Pertinent Vitals/Pain Pain Assessment: Faces Faces Pain Scale: Hurts a little bit Pain Location: L trap Pain Descriptors / Indicators: Grimacing;Guarding Pain Intervention(s): Limited activity within patient's tolerance;Heat applied;Monitored during session;Repositioned    Home Living Family/patient expects to be discharged to:: Private residence Living Arrangements: Other relatives(daughter, cousin, cousin's spouse) Available Help at Discharge: Family;Available 24 hours/day(reports cousin's spouse home all the time) Type of Home: House Home Access: Stairs to enter Entrance Stairs-Rails: Right Entrance Stairs-Number of Steps: 2-3 Home Layout: One level Home Equipment: Cane - single point;Walker - 2 wheels;Wheelchair - manual;Bedside commode      Prior Function Level of Independence: Independent         Comments: pt reports he initially was using DME after hospital/rehab stay last summer, prior to this admission was back to independence        Extremity/Trunk Assessment   Upper Extremity Assessment Upper Extremity Assessment: Defer to OT  evaluation    Lower Extremity Assessment  Lower Extremity Assessment: LLE deficits/detail;RLE deficits/detail RLE Deficits / Details: ROM WFL, Strength grossly assessed 5/5 RLE Sensation: decreased light touch(in comparison with L ) LLE Deficits / Details: ROM WFL, strength grossly assessed 4/5,     Cervical / Trunk Assessment Cervical / Trunk Assessment: Normal  Communication   Communication: No difficulties(mumbled/muffled speech as pt wearing mask)  Cognition Arousal/Alertness: Awake/alert Behavior During Therapy: WFL for tasks assessed/performed Overall Cognitive Status: Within Functional Limits for tasks assessed                                 General Comments: for basic functional tasks assessed today      General Comments General comments (skin integrity, edema, etc.): pt wearing mask during session, ocassionally difficult to understand do to this        Assessment/Plan    PT Assessment Patient needs continued PT services  PT Problem List Decreased strength;Decreased range of motion;Decreased activity tolerance;Decreased balance;Decreased mobility;Impaired sensation;Pain       PT Treatment Interventions DME instruction;Gait training;Stair training;Functional mobility training;Therapeutic activities;Therapeutic exercise;Balance training;Cognitive remediation;Patient/family education;Neuromuscular re-education    PT Goals (Current goals can be found in the Care Plan section)  Acute Rehab PT Goals Patient Stated Goal: home, regain strength PT Goal Formulation: With patient Time For Goal Achievement: 07/08/18 Potential to Achieve Goals: Fair    Frequency Min 4X/week    AM-PAC PT "6 Clicks" Mobility  Outcome Measure Help needed turning from your back to your side while in a flat bed without using bedrails?: None Help needed moving from lying on your back to sitting on the side of a flat bed without using bedrails?: None Help needed moving to and from  a bed to a chair (including a wheelchair)?: None Help needed standing up from a chair using your arms (e.g., wheelchair or bedside chair)?: None Help needed to walk in hospital room?: A Little Help needed climbing 3-5 steps with a railing? : A Little 6 Click Score: 22    End of Session Equipment Utilized During Treatment: Gait belt Activity Tolerance: Patient tolerated treatment well;Patient limited by fatigue Patient left: in bed;with call bell/phone within reach;with bed alarm set   PT Visit Diagnosis: Unsteadiness on feet (R26.81);Other abnormalities of gait and mobility (R26.89);Muscle weakness (generalized) (M62.81);Difficulty in walking, not elsewhere classified (R26.2)    Time: 1211-1225 PT Time Calculation (min) (ACUTE ONLY): 14 min   Charges:   PT Evaluation $PT Eval Moderate Complexity: 1 Mod          Maquita Sandoval B. Migdalia Dk PT, DPT Acute Rehabilitation Services Pager [phone] Office [phone]   [place] 06/24/2018, 12:59 PM

## 2018-06-25 ENCOUNTER — Other Ambulatory Visit: Payer: Self-pay | Admitting: Internal Medicine

## 2018-06-25 ENCOUNTER — Telehealth: Payer: Self-pay | Admitting: Nurse Practitioner

## 2018-06-25 NOTE — Telephone Encounter (Signed)
CMA spoke to [name] from home health. Thomas [name] did a performance form on patient and patient was positive for the questionnaires, and wanted to inform PCP his home health care will be delay until patient get's better.

## 2018-06-25 NOTE — Telephone Encounter (Signed)
Hinton Dyer called in from home health had to fill out a pi form because pt tested positive for some of their questionnaires before visit and wont be able to see pt until its been cleared out pt had fever,coughing,weightless and tiredness

## 2018-06-26 ENCOUNTER — Telehealth: Payer: Self-pay | Admitting: Emergency Medicine

## 2018-06-26 NOTE — Telephone Encounter (Signed)
If no fever; will continue to monitor for symptoms. HH should be able to resume if patient has been afebrile for 72 hours

## 2018-06-26 NOTE — Telephone Encounter (Signed)
Encounter entered in error.

## 2018-06-26 NOTE — Telephone Encounter (Signed)
Patients call returned.  Patient verified by name and date of birth. Patients home health called yesterday stating patient had been positive for their pre-screening questions such as fever and weakness.    Triage called patient to check up on him and was told he had no fever and his symptoms were resolved.  Patient denied any COVID-19 questions.

## 2018-07-01 ENCOUNTER — Telehealth: Payer: Self-pay | Admitting: Nurse Practitioner

## 2018-07-01 NOTE — Telephone Encounter (Signed)
Will route to PCP for approval.

## 2018-07-01 NOTE — Telephone Encounter (Signed)
Thomas [name] with community care called for verbaal orders 2 a week for 2 weeks 1 a week for 2 week

## 2018-07-01 NOTE — Telephone Encounter (Signed)
Agree with verbal orders to be given. Thank you

## 2018-07-02 NOTE — Telephone Encounter (Signed)
CMA attempt to reach [facility] from [facility], No answer and left a VM.

## 2018-07-04 ENCOUNTER — Inpatient Hospital Stay: Payer: Medicaid Other

## 2018-07-08 ENCOUNTER — Encounter: Payer: Self-pay | Admitting: Primary Care

## 2018-07-08 ENCOUNTER — Other Ambulatory Visit: Payer: Self-pay

## 2018-07-08 ENCOUNTER — Ambulatory Visit: Payer: Medicaid Other | Attending: Primary Care | Admitting: Primary Care

## 2018-07-08 VITALS — BP 150/89 | HR 61 | Temp 97.6°F | Ht 67.0 in | Wt 139.8 lb

## 2018-07-08 DIAGNOSIS — Z79899 Other long term (current) drug therapy: Secondary | ICD-10-CM | POA: Insufficient documentation

## 2018-07-08 DIAGNOSIS — Z7982 Long term (current) use of aspirin: Secondary | ICD-10-CM | POA: Insufficient documentation

## 2018-07-08 DIAGNOSIS — Z09 Encounter for follow-up examination after completed treatment for conditions other than malignant neoplasm: Secondary | ICD-10-CM

## 2018-07-08 DIAGNOSIS — I10 Essential (primary) hypertension: Secondary | ICD-10-CM | POA: Diagnosis not present

## 2018-07-08 DIAGNOSIS — Z7901 Long term (current) use of anticoagulants: Secondary | ICD-10-CM | POA: Diagnosis not present

## 2018-07-08 DIAGNOSIS — R112 Nausea with vomiting, unspecified: Secondary | ICD-10-CM | POA: Diagnosis not present

## 2018-07-08 DIAGNOSIS — Z760 Encounter for issue of repeat prescription: Secondary | ICD-10-CM | POA: Insufficient documentation

## 2018-07-08 DIAGNOSIS — Z87891 Personal history of nicotine dependence: Secondary | ICD-10-CM | POA: Insufficient documentation

## 2018-07-08 DIAGNOSIS — I639 Cerebral infarction, unspecified: Secondary | ICD-10-CM

## 2018-07-08 DIAGNOSIS — R269 Unspecified abnormalities of gait and mobility: Secondary | ICD-10-CM | POA: Diagnosis not present

## 2018-07-08 DIAGNOSIS — Z8673 Personal history of transient ischemic attack (TIA), and cerebral infarction without residual deficits: Secondary | ICD-10-CM | POA: Diagnosis not present

## 2018-07-08 DIAGNOSIS — M25512 Pain in left shoulder: Secondary | ICD-10-CM | POA: Diagnosis not present

## 2018-07-08 NOTE — Progress Notes (Signed)
Established Patient Office Visit  Subjective:  Patient ID: Thomas [name], male    DOB: 1955/09/22  Age: 63 y.o. MRN: [id number]  CC:    Shoulder Pain   The pain is present in the left shoulder. This is a recurrent problem. The current episode started 1 to 4 weeks ago. There has been no history of extremity trauma. The problem occurs daily. The problem has been unchanged. The quality of the pain is described as aching. The pain is at a severity of 5/10. The pain is mild. The symptoms are aggravated by activity.  Medication Refill  Associated symptoms include headaches and weakness.    HPI Thomas [name] presents for hospital follow up secondary to being out of Bp meds. His doctor took him for blurred vision . N/v and left shoulder pain.  Past Medical History:  Diagnosis Date  . Hypertension   . Stroke [facility])     Past Surgical History:  Procedure Laterality Date  . ENDOVASCULAR STENT INSERTION N/A 07/01/2017   Procedure: ENDOVASCULAR STENT GRAFT INSERTION;  Surgeon: Serafina Mitchell, MD;  Location: [facility] OR;  Service: Vascular;  Laterality: N/A;  . THROMBECTOMY FEMORAL ARTERY Bilateral 07/01/2017   Procedure: Bilateral IlioFemoral Thrombectomies;  Surgeon: Serafina Mitchell, MD;  Location: [place];  Service: Vascular;  Laterality: Bilateral;    History reviewed. No pertinent family history.  Social History   Socioeconomic History  . Marital status: Divorced    Spouse name: Not on file  . Number of children: Not on file  . Years of education: Not on file  . Highest education level: Not on file  Occupational History  . Not on file  Social Needs  . Financial resource strain: Not on file  . Food insecurity:    Worry: Not on file    Inability: Not on file  . Transportation needs:    Medical: Not on file    Non-medical: Not on file  Tobacco Use  . Smoking status: Former Smoker    Types: Cigarettes    Last attempt to quit: 07/16/2017    Years since quitting: 0.9  . Smokeless tobacco:  Never Used  Substance and Sexual Activity  . Alcohol use: No  . Drug use: No  . Sexual activity: Not Currently  Lifestyle  . Physical activity:    Days per week: Not on file    Minutes per session: Not on file  . Stress: Not on file  Relationships  . Social connections:    Talks on phone: Not on file    Gets together: Not on file    Attends religious service: Not on file    Active member of club or organization: Not on file    Attends meetings of clubs or organizations: Not on file    Relationship status: Not on file  . Intimate partner violence:    Fear of current or ex partner: Not on file    Emotionally abused: Not on file    Physically abused: Not on file    Forced sexual activity: Not on file  Other Topics Concern  . Not on file  Social History Narrative  . Not on file    Outpatient Medications Prior to Visit  Medication Sig Dispense Refill  . aspirin EC 81 MG tablet Take 1 tablet (81 mg total) by mouth daily. 90 tablet 0  . atorvastatin (LIPITOR) 20 MG tablet Take 1 tablet (20 mg total) by mouth daily at 6 PM. 90 tablet 1  .  clopidogrel (PLAVIX) 75 MG tablet Take 1 tablet (75 mg total) by mouth daily. 30 tablet 2  . losartan (COZAAR) 50 MG tablet Take 1 tablet (50 mg total) by mouth daily. 90 tablet 3  . omeprazole (PRILOSEC) 20 MG capsule TAKE 1 CAPSULE (20 MG TOTAL) BY MOUTH DAILY. 30 capsule 2   No facility-administered medications prior to visit.     No Known Allergies  ROS Review of Systems  Constitutional: Negative.   Eyes: Negative.   Respiratory: Positive for shortness of breath.   Cardiovascular: Negative.   Gastrointestinal: Negative.   Endocrine: Negative.   Genitourinary: Negative.   Musculoskeletal:       Left shoulder pain with weakness and decrease ROM  Allergic/Immunologic: Negative.   Neurological: Positive for weakness and headaches.  Hematological: Negative.   Psychiatric/Behavioral: Negative.       Objective:    Physical Exam   Constitutional: He is oriented to person, place, and time. He appears well-developed and well-nourished.  HENT:  Head: Normocephalic.  Eyes: EOM are normal.  Neck: Normal range of motion.  Cardiovascular: Normal rate and regular rhythm.  Pulmonary/Chest: Effort normal and breath sounds normal.  Abdominal: Bowel sounds are normal.  Musculoskeletal: Normal range of motion.     Comments: Shoulder pain left decrease ROM  Neurological: He is alert and oriented to person, place, and time.  Skin: Skin is warm.  Psychiatric: He has a normal mood and affect.    BP (!) 150/89 (BP Location: Right Arm, Patient Position: Sitting, Cuff Size: Normal)   Pulse 61   Temp 97.6 F (36.4 C) (Oral)   Ht 5\' 7"  (1.702 m)   Wt 139 lb 12.8 oz (63.4 kg)   SpO2 99%   BMI 21.90 kg/m  Wt Readings from Last 3 Encounters:  07/08/18 139 lb 12.8 oz (63.4 kg)  06/23/18 130 lb (59 kg)  05/07/18 137 lb (62.1 kg)     Health Maintenance Due  Topic Date Due  . Hepatitis C Screening  1956/03/23  . TETANUS/TDAP  11/09/1972  . COLONOSCOPY  11/10/2003    There are no preventive care reminders to display for this patient.  Lab Results  Component Value Date   TSH 0.471 06/28/2017   Lab Results  Component Value Date   WBC 12.7 (H) 06/23/2018   HGB 14.3 06/23/2018   HCT 44.0 06/23/2018   MCV 79.0 (L) 06/23/2018   PLT 364 06/23/2018   Lab Results  Component Value Date   NA 135 06/23/2018   K 4.8 06/23/2018   CO2 23 06/23/2018   GLUCOSE 135 (H) 06/23/2018   BUN 21 06/23/2018   CREATININE 1.50 (H) 06/23/2018   BILITOT 0.9 06/23/2018   ALKPHOS 94 06/23/2018   AST 30 06/23/2018   ALT 18 06/23/2018   PROT 7.7 06/23/2018   ALBUMIN 3.7 06/23/2018   CALCIUM 9.4 06/23/2018   ANIONGAP 7 06/23/2018   Lab Results  Component Value Date   CHOL 212 (H) 06/23/2018   Lab Results  Component Value Date   HDL 45 06/23/2018   Lab Results  Component Value Date   LDLCALC 144 (H) 06/23/2018   Lab Results   Component Value Date   TRIG 116 06/23/2018   Lab Results  Component Value Date   CHOLHDL 4.7 06/23/2018   Lab Results  Component Value Date   HGBA1C 5.4 06/23/2018      Assessment & Plan:   Problem List Items Addressed This Visit    Hypertension - Primary  Gait disorder   Acute ischemic stroke [facility])   Acute pain of left shoulder    Other Visit Diagnoses    Hospital discharge follow-up        Paige was seen today for hospitalization follow-up, fatigue, shoulder pain and medication refill.  Diagnoses and all orders for this visit:  Essential hypertension Medications were delivered to his house after hospital visit  Acute ischemic stroke [facility]) Hx of with deficits strength , gait and speach  Gait disorder  secondary to an old stroke uses a cain for stability   Hospital discharge follow-up dtr brought in for n/v and left shoulder pain. Out of meds  Acute pain of left shoulder Shoulder Pain   The pain is present in the left shoulder. This is a recurrent problem. The current episode started 1 to 4 weeks ago. There has been no history of extremity trauma. The problem occurs daily. The problem has been unchanged. The quality of the pain is described as aching. The pain is at a severity of 5/10. The pain is mild. Associated symptoms include a limited range of motion. The symptoms are aggravated by activity.      No orders of the defined types were placed in this encounter.   Follow-up: Return in about 3 months (around 10/07/2018) for HTN.    Kerin Perna, NP

## 2018-07-08 NOTE — Patient Instructions (Signed)

## 2018-07-08 NOTE — Progress Notes (Signed)
Hospital f/u for vomiting/ per pt he is feel better/ he said he feels like it was related to a medicine he took.   Left Shoulder pain  Fatigue  Medication refills

## 2018-07-24 ENCOUNTER — Other Ambulatory Visit: Payer: Self-pay | Admitting: Internal Medicine

## 2018-08-08 ENCOUNTER — Telehealth: Payer: Self-pay

## 2018-08-08 NOTE — Telephone Encounter (Signed)
I called pts daughter Madlyn Frankel that visit will be change to video due to COVID 19. I receive verbal consent to do video and file insurance from pts daughter. She has phone and lap top with a camera on it. She verified her email in the system as correct. I explain email will be sent day of visit and to click on blue link 10 minutes prior to visit and click video and wait on provider to join. She verbalized understanding.

## 2018-08-13 ENCOUNTER — Other Ambulatory Visit: Payer: Self-pay

## 2018-08-13 ENCOUNTER — Ambulatory Visit: Payer: Medicaid Other | Admitting: Adult Health

## 2018-08-13 NOTE — Telephone Encounter (Signed)
I call pts daughter that visit will have to be reschedule due to epic being down. PT r/s with Dr Leonie Man for video visit.

## 2018-08-26 ENCOUNTER — Ambulatory Visit: Payer: Self-pay | Admitting: Neurology

## 2018-08-26 NOTE — Telephone Encounter (Signed)
pts daughter called in and stated pt needs his appt set for a Wednesday he is unable to do his appt today , appt has been r/s

## 2018-08-28 ENCOUNTER — Ambulatory Visit (INDEPENDENT_AMBULATORY_CARE_PROVIDER_SITE_OTHER): Payer: Medicaid Other | Admitting: Neurology

## 2018-08-28 ENCOUNTER — Other Ambulatory Visit: Payer: Self-pay

## 2018-08-28 DIAGNOSIS — I6601 Occlusion and stenosis of right middle cerebral artery: Secondary | ICD-10-CM | POA: Diagnosis not present

## 2018-08-28 NOTE — Progress Notes (Signed)
Virtual Visit via Video Note  I connected with Thomas [name] on 08/28/18 at 10:00 AM EDT by a video enabled telemedicine application and verified that I am speaking with the correct person using two identifiers. This visit was performed using [email or web address] app for audio and visual.  The patient's daughter was present at his side throughout the visit and facilitated the visit. Location: Patient: at his home Provider: at [facility] office   I discussed the limitations of evaluation and management by telemedicine and the availability of in person appointments. The patient expressed understanding and agreed to proceed.  History of Present Illness: [name] is a 63 year old Asian male seen today for initial office follow-up visit following hospital admission for stroke in March 2020.  History is obtained from him and review of electronic medical records.  I personally reviewed imaging films.Thomas [name] is an 63 y.o. male Thomas [name] an 63 y.o.maleMr. Thomas [name] is a 63 yo man with a medical history ofmultiplestrokes, but minimal residual effect ofchronic right leg pain and bilateral lower extremity numbness(mRS 1),HTN, PVD,ruptured AAA s/p repair, and thoracic aortic atherosclerosis. On 3/29 hepresented to the ED with dizziness (which is normal for him for many years), but new onset ofvomiting that started this morning. He denies that there is any "new" quality to this usual dizziness.The dizziness improves when he closes his eyes. He denies vision changes or headache. He had 2-3 episodes of vomiting at home and a few more episodes in the hospital. Hestates he feelsgenerally unwell and generalweakness, but no focal weakness or falls. He takes full dose aspirin daily.MRI brain was done, which incidentally shows a new ischemic stroke on the right lateral temporal horn of ventricle. There is no clear onset time of stroke nor clinical symptoms, thus no stroke interventions offered .  CT scan of the head on admission was  negative for acute abnormality but MRI scan showed new tiny ischemic stroke around the right lateral temporal horn of the ventricle.  CT angiogram of the head and neck showed severe intracranial atherosclerotic disease throughout with right vertebral occlusion and chronic right M1 middle cerebral artery occlusion.  There was mild bilateral carotid atherosclerosis but no significant stenosis.  Transthoracic echo showed normal ejection fraction.  LDL cholesterol was elevated at 144 mg percent.  Urine drug screen was negative.  Hemoglobin A1c was 5.4.  Patient was started on dual antiplatelet therapy aspirin and Plavix.  He overall felt well and dizziness improved.  He had chronic hearing loss in the left ear which is unchanged.  His blood pressure remained on the lower side during the hospitalization.  It was felt that this tiny right temporal white matter infarct was incidental due to failure of collaterals circulations with chronic right MCA occlusion in the setting of dehydration and hypotension.  Patient states he is done well since discharge.  His dizziness has improved.  He has had no further stroke or TIA symptoms.  He is remains on aspirin and Plavix which is tolerating well without bruising or bleeding.  He states his blood pressure is under good control.  Is tolerating Lipitor without muscle aches or pains.  He has no new complaints today.   Observations/Objective: Physical and neurological exam was limited due to constraints of virtual video visit.  Pleasant middle-age Asian male not in distress.  Is alert oriented to time place and person.  Speech and language appear normal.  Extraocular movements are full range without nystagmus.  Hearing is diminished on the left.  Face is  symmetric without weakness.  Tongue is midline.  Motor system exam reveals symmetric upper and lower extremity strength without focal weakness.  Fine finger movements are symmetric.  Is able to stand on either foot unsupported.  He  can walk with a slow steady gait.  Assessment  : 63 year old Asian male with history of multiple remote strokes with minimal residual chronic right leg numbness who presented on 06/23/2018 with exacerbation of chronic dizziness in the setting of new onset of vomiting and dehydration and hypotension.  His right temporal periventricular white matter stroke was likely incidental in the setting of failure of collaterals from dehydration and hypertension with known chronic right MCA occlusion.  He has done well since discharge. Plan continue aspirin and Plavix for a total of 3 months till the end of June then discontinue Plavix from July and stay on aspirin alone for stroke prevention.  Maintain aggressive risk factor modification with strict control of hypertension with blood pressure goal below 130/90, lipids with LDL cholesterol goal below 70 mg percent and diabetes with hemoglobin A1c goal below 5.4%.  He was also advised to maintain adequate hydration and eat a healthy diet and be active and not gain weight.  He will return for follow-up in the future in 3 months with my nurse practitioner Janett Billow or call earlier if necessary. Follow Up Instructions: He will return for follow-up in the future in 3 months with my nurse practitioner Janett Billow or call earlier if necessary.    I discussed the assessment and treatment plan with the patient. The patient was provided an opportunity to ask questions and all were answered. The patient agreed with the plan and demonstrated an understanding of the instructions.   The patient was advised to call back or seek an in-person evaluation if the symptoms worsen or if the condition fails to improve as anticipated.  I provided 25 minutes of non-face-to-face time during this encounter.   Antony Contras, MD

## 2018-09-18 ENCOUNTER — Other Ambulatory Visit: Payer: Self-pay | Admitting: Internal Medicine

## 2018-09-19 ENCOUNTER — Other Ambulatory Visit: Payer: Self-pay | Admitting: *Deleted

## 2018-09-19 DIAGNOSIS — I712 Thoracic aortic aneurysm, without rupture, unspecified: Secondary | ICD-10-CM

## 2018-10-07 ENCOUNTER — Ambulatory Visit: Payer: Medicaid Other | Admitting: Nurse Practitioner

## 2018-10-24 ENCOUNTER — Ambulatory Visit
Admission: RE | Admit: 2018-10-24 | Discharge: 2018-10-24 | Disposition: A | Discharge status: Home / Self Care | Payer: Medicaid Other | Source: Ambulatory Visit | Attending: Thoracic Surgery (Cardiothoracic Vascular Surgery) | Admitting: Thoracic Surgery (Cardiothoracic Vascular Surgery)

## 2018-10-24 DIAGNOSIS — I712 Thoracic aortic aneurysm, without rupture, unspecified: Secondary | ICD-10-CM

## 2018-10-24 MED ORDER — IOPAMIDOL (ISOVUE-370) INJECTION 76%
60.0000 mL | Freq: Once | INTRAVENOUS | Status: AC | PRN
  Administered 2018-10-24: 60 mL via INTRAVENOUS

## 2018-10-29 ENCOUNTER — Encounter: Payer: Self-pay | Admitting: Thoracic Surgery (Cardiothoracic Vascular Surgery)

## 2018-10-29 ENCOUNTER — Other Ambulatory Visit: Payer: Medicaid Other

## 2018-10-29 ENCOUNTER — Other Ambulatory Visit: Payer: Self-pay

## 2018-10-29 ENCOUNTER — Ambulatory Visit (INDEPENDENT_AMBULATORY_CARE_PROVIDER_SITE_OTHER): Payer: PPO | Admitting: Thoracic Surgery (Cardiothoracic Vascular Surgery)

## 2018-10-29 VITALS — BP 120/80 | HR 80 | Temp 97.7°F | Resp 18 | Ht 67.0 in | Wt 143.0 lb

## 2018-10-29 DIAGNOSIS — I712 Thoracic aortic aneurysm, without rupture, unspecified: Secondary | ICD-10-CM

## 2018-10-29 NOTE — Progress Notes (Signed)
Thomas [name]       [place] [address]             [phone]     HPI: [name] returns for a scheduled follow-up visit  Thomas [name] is a 63 year old [place] gentleman with a history of tobacco abuse, hypertension, multiple cerebellar strokes, ruptured abdominal aortic aneurysm, and ascending aortic aneurysm.  He was diagnosed with multiple acute cerebellar infarcts on an MRI of the brain in April 2019.  He also grew pneumococcus from his blood at that time.  He developed acute abdominal pain and was found to have a ruptured 6.6 cm abdominal aortic aneurysm.  Dr. Trula Slade did an emergent repair.  He had recurrent cerebellar infarcts in May 2019.  An echocardiogram showed moderate AI, but no vegetations.  CT of the chest showed a 4.5 cm ascending aneurysm with extensive soft plaque in the arch and proximal descending aorta.  I saw him in July 2019 and then again in February 2020.  His aneurysm was unchanged between the 2 visits.  In the interim since his last visit he continues to have problems with dizziness and general lack of energy.  He is not having any chest pain, pressure, or tightness.  He does have some back pain between his shoulder blades which is chronic.  Patient Active Problem List   Diagnosis Date Noted  . Acute pain of left shoulder 07/08/2018  . Acute ischemic stroke (Thomas [name]) 06/23/2018  . Thoracic aortic aneurysm without rupture ([place])   . Chronic anticoagulation   . Chronic ischemic vertebrobasilar artery cerebellar stroke 08/18/2017  . Gait disorder   . Paraparesis ([place])   . Hypertension 08/15/2017  . Gait instability 08/15/2017  . Pelvic pain 08/15/2017  . Chronic kidney disease 08/15/2017  . Abnormal CT scan, pelvis 08/15/2017  . Meniere disease, left 08/08/2017  . Acute respiratory failure with hypoxia ([place])   . Acute blood loss anemia 07/02/2017  . S/P AAA repair   . LLQ abdominal pain   . Ruptured abdominal aortic aneurysm (AAA) ([place])    . Hypotension   . Bacteremia due to Streptococcus 06/30/2017  . Chest pain 06/28/2017  . CAP (community acquired pneumonia): Clinical 06/28/2017  . Acute renal failure superimposed on stage 3 chronic kidney disease ([place]) 06/28/2017  . Dehydration 06/28/2017  . Leukocytosis 06/28/2017  . Elevated troponin 06/28/2017  . Dizziness 06/28/2017  . Vertigo 06/28/2017  . Acute pericarditis: Probable 06/28/2017     Current Outpatient Medications  Medication Sig Dispense Refill  . losartan (COZAAR) 50 MG tablet Take 1 tablet (50 mg total) by mouth daily. 90 tablet 3  . omeprazole (PRILOSEC) 20 MG capsule TAKE 1 CAPSULE (20 MG TOTAL) BY MOUTH DAILY. 30 capsule 2  . atorvastatin (LIPITOR) 20 MG tablet Take 1 tablet (20 mg total) by mouth daily at 6 PM. (Patient not taking: Reported on 10/29/2018) 90 tablet 1  . clopidogrel (PLAVIX) 75 MG tablet Take 1 tablet (75 mg total) by mouth daily. (Patient not taking: Reported on 10/29/2018) 30 tablet 2   No current facility-administered medications for this visit.     Physical Exam BP 120/80 (BP Location: Right Arm, Patient Position: Sitting, Cuff Size: Normal)   Pulse 80   Temp 97.7 F (36.5 C)   Resp 18   Ht 5\' 7"  (1.702 m)   Wt 143 lb (64.9 kg)   SpO2 95% Comment: RA  BMI 22.53 kg/m  63 year old Asian man in no acute  distress, appears chronically ill Alert and oriented no focal motor deficit Cardiac regular rate and rhythm no rubs murmurs or gallops Lungs with basilar crackles bilaterally No peripheral edema  Diagnostic Tests: CT ANGIOGRAPHY CHEST WITH CONTRAST  TECHNIQUE: Multidetector CT imaging of the chest was performed using the standard protocol during bolus administration of intravenous contrast. Multiplanar CT image reconstructions and MIPs were obtained to evaluate the vascular anatomy.  CONTRAST:  73mL ISOVUE-370 IOPAMIDOL (ISOVUE-370) INJECTION 76%  COMPARISON:  04/11/2018, 08/18/2007, 07/03/2017  FINDINGS:  Cardiovascular: Preferential opacification of the thoracic aorta. Redemonstrated severe, irregular atherosclerosis of the thoracic aorta, with no significant change in configuration of extensive irregular atherosclerosis or mural thrombus. The tubular ascending thoracic aorta is enlarged to 4.2 x 4.2 cm, unchanged when compared to prior examinations dating back to 07/03/2017. The sinuses of Valsalva, aortic valve, and descending thoracic aorta are normal in caliber. Mild cardiomegaly. Three-vessel coronary artery calcifications. No pericardial effusion.  Mediastinum/Nodes: No enlarged mediastinal, hilar, or axillary lymph nodes. Thyroid gland, trachea, and esophagus demonstrate no significant findings.  Lungs/Pleura: Moderate centrilobular emphysema. Diffuse bilateral bronchial wall thickening. No pleural effusion or pneumothorax.  Upper Abdomen: No acute abnormality. Cholelithiasis. Unchanged small left adrenal adenoma measuring approximately 1.3 cm, benign  Musculoskeletal: No chest wall abnormality. No acute or significant osseous findings.  Review of the MIP images confirms the above findings.  IMPRESSION: 1. Redemonstrated severe, irregular atherosclerosis of the thoracic aorta, with no significant change in configuration of extensive irregular atherosclerosis or mural thrombus. The tubular ascending thoracic aorta is enlarged to 4.2 x 4.2 cm, unchanged when compared to prior examinations dating back to 07/03/2017. The sinuses of Valsalva, aortic valve, and descending thoracic aorta are normal in caliber.  2.  Coronary artery disease.  3. Emphysema and diffuse bilateral bronchial wall thickening, consistent with nonspecific infectious or inflammatory bronchitis.   Electronically Signed   By: Eddie Candle M.D.   On: 10/24/2018 12:32 I personally reviewed the CT images and concur with the findings noted above.  I did measure the aorta at 4.4 cm on coronal  views.  Impression: Thomas [name] is a 62 year old man with multiple medical issues including thoracic aortic atherosclerosis, ascending aortic aneurysm, multiple cerebellar strokes, peripheral neuropathy, remote tobacco abuse, probable pulmonary fibrosis, stage III chronic kidney disease, and history of a ruptured abdominal aneurysm requiring emergent repair.  Ascending aortic aneurysm/aortic atherosclerosis-CT findings are stable.  I do think the aneurysm measures about 4.4 cm.  Needs continued semiannual follow-up.  There is no indication for surgery at the present time.  He would be a poor surgical candidate due to his extensive medical issues.  He is on a statin (Lipitor).  His blood pressure is well controlled currently.  Coronary artery atherosclerosis noted on CT-no anginal symptoms currently.  Plan: Return in 6 months with CT Angio of chest  Melrose Nakayama, MD [facility] [phone]

## 2018-11-07 ENCOUNTER — Telehealth: Payer: Self-pay | Admitting: Nurse Practitioner

## 2018-11-07 NOTE — Telephone Encounter (Signed)
New Message    Deanna from [facility] is calling to check on a plan of care she has been waiting from and wants to know if it has been sent. Please f/u

## 2018-11-08 ENCOUNTER — Encounter: Payer: Self-pay | Admitting: Nurse Practitioner

## 2018-11-08 ENCOUNTER — Ambulatory Visit: Payer: PPO | Attending: Nurse Practitioner | Admitting: Nurse Practitioner

## 2018-11-08 ENCOUNTER — Other Ambulatory Visit: Payer: Self-pay

## 2018-11-08 VITALS — BP 165/93 | HR 67 | Temp 98.4°F | Ht 67.0 in | Wt 148.0 lb

## 2018-11-08 DIAGNOSIS — I10 Essential (primary) hypertension: Secondary | ICD-10-CM

## 2018-11-08 DIAGNOSIS — K219 Gastro-esophageal reflux disease without esophagitis: Secondary | ICD-10-CM

## 2018-11-08 DIAGNOSIS — Z1159 Encounter for screening for other viral diseases: Secondary | ICD-10-CM

## 2018-11-08 DIAGNOSIS — Z8673 Personal history of transient ischemic attack (TIA), and cerebral infarction without residual deficits: Secondary | ICD-10-CM | POA: Diagnosis not present

## 2018-11-08 DIAGNOSIS — R7989 Other specified abnormal findings of blood chemistry: Secondary | ICD-10-CM

## 2018-11-08 DIAGNOSIS — I6930 Unspecified sequelae of cerebral infarction: Secondary | ICD-10-CM

## 2018-11-08 DIAGNOSIS — D72829 Elevated white blood cell count, unspecified: Secondary | ICD-10-CM

## 2018-11-08 DIAGNOSIS — Z1211 Encounter for screening for malignant neoplasm of colon: Secondary | ICD-10-CM | POA: Diagnosis not present

## 2018-11-08 MED ORDER — OMEPRAZOLE 20 MG PO CPDR: 20.0000 mg | DELAYED_RELEASE_CAPSULE | Freq: Every day | ORAL | 2 refills | Status: DC

## 2018-11-08 MED ORDER — AMLODIPINE BESYLATE 5 MG PO TABS: 5.0000 mg | ORAL_TABLET | Freq: Every day | ORAL | 3 refills | Status: DC

## 2018-11-08 MED ORDER — ASPIRIN 325 MG PO TABS: 325.0000 mg | ORAL_TABLET | Freq: Every day | ORAL | 3 refills | Status: DC

## 2018-11-08 NOTE — Progress Notes (Signed)
Assessment & Plan:  Thomas [name] was seen today for follow-up.  Diagnoses and all orders for this visit:  Essential hypertension -     amLODipine (NORVASC) 5 MG tablet; Take 1 tablet (5 mg total) by mouth daily. Continue all antihypertensives as prescribed.  Remember to bring in your blood pressure log with you for your follow up appointment.  DASH/Mediterranean Diets are healthier choices for HTN.    Leukocytosis, unspecified type -     CBC  Colon cancer screening -     Ambulatory referral to Gastroenterology  Encounter for hepatitis C screening test for low risk patient -     Hepatitis C Antibody  Elevated serum creatinine -     Basic Metabolic Panel  Chronic ischemic vertebrobasilar artery cerebellar stroke -     aspirin 325 MG tablet; Take 1 tablet (325 mg total) by mouth daily. Appt with Neurology scheduled for 12-03-2018   Gastroesophageal reflux disease, esophagitis presence not specified -     omeprazole (PRILOSEC) 20 MG capsule; Take 1 capsule (20 mg total) by mouth daily. INSTRUCTIONS: Avoid GERD Triggers: acidic, spicy or fried foods, caffeine, coffee, sodas,  alcohol and chocolate.   Patient has been counseled on age-appropriate routine health concerns for screening and prevention. These are reviewed and up-to-date. Referrals have been placed accordingly. Immunizations are up-to-date or declined.    Subjective:   Chief Complaint  Patient presents with  . Follow-up    Pt. is here for blood pressure.    HPI Thomas [name] 63 y.o. male presents to office today for follow up. He is accompanied by his daughter.  Past medical history of Hypertension, Occlusion of right middle cerebral artery, PVD (peripheral vascular disease) ([place]), Ruptured abdominal aortic aneurysm (AAA) ([place]), Stroke ([place]), and Thoracic aortic aneurysm (TAA) ([place]). Plavix was dc'd in June. Currently taking ASA 325mg  daily as prescribed.  Goal BP 130/90. Lipid goal below 70 and A1c Below 5.4.   He is  currently being followed by CTS for stable AAA 4.4 cm. Next follow up 6 months with CT angiogram of chest scheduled. Aneurysm is stable at this time.   Also being followed by Neurology with most recent office visit 2 months ago.   Essential Hypertension Not well controlled. He is taking Losartan 50 mg daily as prescribed. Will add amlodipine 5 mg today. He does not monitor his blood pressure at home. Denies chest pain, shortness of breath, palpitations, headaches or BLE edema. He does endorse dizziness has slightly improved however right leg/calf pain with ambulation continues.  BP Readings from Last 3 Encounters:  11/08/18 (!) 165/93  10/29/18 120/80  07/08/18 (!) 150/89   Lab Results  Component Value Date   LDLCALC 144 (H) 06/23/2018   Lab Results  Component Value Date   HGBA1C 5.4 06/23/2018   Review of Systems  Constitutional: Negative for fever, malaise/fatigue and weight loss.  HENT: Negative.  Negative for nosebleeds.   Eyes: Negative.  Negative for blurred vision, double vision and photophobia.  Respiratory: Negative.  Negative for cough and shortness of breath.   Cardiovascular: Negative.  Negative for chest pain, palpitations and leg swelling.  Gastrointestinal: Positive for heartburn. Negative for nausea and vomiting.  Musculoskeletal: Negative for myalgias.       Right leg/calf pain  Neurological: Positive for dizziness and weakness. Negative for focal weakness, seizures and headaches.  Psychiatric/Behavioral: Negative.  Negative for suicidal ideas.    Past Medical History:  Diagnosis Date  . Hypertension   . Occlusion  of right middle cerebral artery   . PVD (peripheral vascular disease) ([place])   . Ruptured abdominal aortic aneurysm (AAA) ([place])   . Stroke ([place])   . Thoracic aortic aneurysm (TAA) ([place])     Past Surgical History:  Procedure Laterality Date  . ENDOVASCULAR STENT INSERTION N/A 07/01/2017   Procedure: ENDOVASCULAR STENT GRAFT INSERTION;  Surgeon:  Serafina Mitchell, MD;  Location: [facility] OR;  Service: Vascular;  Laterality: N/A;  . THROMBECTOMY FEMORAL ARTERY Bilateral 07/01/2017   Procedure: Bilateral IlioFemoral Thrombectomies;  Surgeon: Serafina Mitchell, MD;  Location: [facility];  Service: Vascular;  Laterality: Bilateral;    History reviewed. No pertinent family history.  Social History Reviewed with no changes to be made today.   Outpatient Medications Prior to Visit  Medication Sig Dispense Refill  . atorvastatin (LIPITOR) 20 MG tablet Take 1 tablet (20 mg total) by mouth daily at 6 PM. 90 tablet 1  . losartan (COZAAR) 50 MG tablet Take 1 tablet (50 mg total) by mouth daily. 90 tablet 3  . aspirin 325 MG tablet Take 325 mg by mouth daily.    Marland Kitchen omeprazole (PRILOSEC) 20 MG capsule TAKE 1 CAPSULE (20 MG TOTAL) BY MOUTH DAILY. 30 capsule 2  . clopidogrel (PLAVIX) 75 MG tablet Take 1 tablet (75 mg total) by mouth daily. (Patient not taking: Reported on 10/29/2018) 30 tablet 2   No facility-administered medications prior to visit.     No Known Allergies     Objective:    BP (!) 165/93 (BP Location: Left Arm, Patient Position: Sitting, Cuff Size: Normal)   Pulse 67   Temp 98.4 F (36.9 C) (Oral)   Ht 5\' 7"  (1.702 m)   Wt 148 lb (67.1 kg)   SpO2 95%   BMI 23.18 kg/m  Wt Readings from Last 3 Encounters:  11/08/18 148 lb (67.1 kg)  10/29/18 143 lb (64.9 kg)  07/08/18 139 lb 12.8 oz (63.4 kg)    Physical Exam Vitals signs and nursing note reviewed.  Constitutional:      Appearance: He is well-developed.  HENT:     Head: Normocephalic and atraumatic.  Neck:     Musculoskeletal: Normal range of motion.  Cardiovascular:     Rate and Rhythm: Normal rate and regular rhythm.     Heart sounds: Normal heart sounds. No murmur. No friction rub. No gallop.   Pulmonary:     Effort: Pulmonary effort is normal. No tachypnea or respiratory distress.     Breath sounds: Normal breath sounds. No decreased breath sounds, wheezing, rhonchi or  rales.  Chest:     Chest wall: No tenderness.  Abdominal:     General: Bowel sounds are normal.     Palpations: Abdomen is soft.  Musculoskeletal: Normal range of motion.  Skin:    General: Skin is warm and dry.  Neurological:     Mental Status: He is alert and oriented to person, place, and time.     Motor: Weakness (right sided) present.     Coordination: Coordination normal.  Psychiatric:        Behavior: Behavior normal. Behavior is cooperative.        Thought Content: Thought content normal.        Judgment: Judgment normal.        Patient has been counseled extensively about nutrition and exercise as well as the importance of adherence with medications and regular follow-up. The patient was given clear instructions to go to ER or return  to medical center if symptoms don't improve, worsen or new problems develop. The patient verbalized understanding.   Follow-up: Return for 2 weeks luke BP then 2 months with me A1C  and physical.   Gildardo Pounds, FNP-BC [facility] [place], [place]   11/08/2018, 2:23 PM

## 2018-11-09 LAB — BASIC METABOLIC PANEL
BUN/Creatinine Ratio: 10 (ref 10–24)
BUN: 19 mg/dL (ref 8–27)
CO2: 23 mmol/L (ref 20–29)
Calcium: 9.5 mg/dL (ref 8.6–10.2)
Chloride: 100 mmol/L (ref 96–106)
Creatinine, Ser: 1.81 mg/dL — ABNORMAL HIGH (ref 0.76–1.27)
GFR calc Af Amer: 45 mL/min/{1.73_m2} — ABNORMAL LOW (ref >59)
GFR calc non Af Amer: 39 mL/min/{1.73_m2} — ABNORMAL LOW (ref >59)
Glucose: 99 mg/dL (ref 65–99)
Potassium: 5 mmol/L (ref 3.5–5.2)
Sodium: 137 mmol/L (ref 134–144)

## 2018-11-09 LAB — CBC
Hematocrit: 45.8 % (ref 37.5–51.0)
Hemoglobin: 14.6 g/dL (ref 13.0–17.7)
MCH: 26.4 pg — ABNORMAL LOW (ref 26.6–33.0)
MCHC: 31.9 g/dL (ref 31.5–35.7)
MCV: 83 fL (ref 79–97)
Platelets: 340 10*3/uL (ref 150–450)
RBC: 5.52 x10E6/uL (ref 4.14–5.80)
RDW: 15.5 % — ABNORMAL HIGH (ref 11.6–15.4)
WBC: 11.9 10*3/uL — ABNORMAL HIGH (ref 3.4–10.8)

## 2018-11-09 LAB — HEPATITIS C ANTIBODY: Hep C Virus Ab: <0.1 s/co ratio (ref 0.0–0.9)

## 2018-11-12 NOTE — Telephone Encounter (Signed)
Unable to contact the [place] from [facility]. No contact information was left for a call back.

## 2018-11-18 ENCOUNTER — Encounter: Payer: Self-pay | Admitting: Gastroenterology

## 2018-11-22 ENCOUNTER — Other Ambulatory Visit: Payer: Self-pay

## 2018-11-22 ENCOUNTER — Ambulatory Visit: Payer: PPO | Attending: Nurse Practitioner | Admitting: Pharmacist

## 2018-11-22 ENCOUNTER — Encounter: Payer: Self-pay | Admitting: Pharmacist

## 2018-11-22 VITALS — BP 99/65 | HR 61

## 2018-11-22 DIAGNOSIS — I10 Essential (primary) hypertension: Secondary | ICD-10-CM | POA: Diagnosis not present

## 2018-11-22 NOTE — Patient Instructions (Signed)

## 2018-11-22 NOTE — Progress Notes (Signed)
    S:    PCP: Zelda   Patient arrives in good spirits. Presents to the clinic for BP check.  Patient was referred and last seen by Primary Care Provider on 11/08/18. BP 165/93 at that visit - pt was started on amlodipine.   Patient reports adherence with medications. Does endorse occasional dizziness after taking BP medications.   Current BP Medications include:   - amlodipine 5 mg daily - losartan 50 mg daily   Dietary habits include: reports eating salt sometimes; drinks 1-2 cups of coffee/day Exercise habits include: limited d/t stroke hx Family / Social history:  - Former smoker (quit in 2019) - Denies OH use O:  L arm after 5 minutes rest: 99/65, HR 61  Home BP readings: not checking  Last 3 Office BP readings: BP Readings from Last 3 Encounters:  11/08/18 (!) 165/93  10/29/18 120/80  07/08/18 (!) 150/89   BMET    Component Value Date/Time   NA 137 11/08/2018 1123   K 5.0 11/08/2018 1123   CL 100 11/08/2018 1123   CO2 23 11/08/2018 1123   GLUCOSE 99 11/08/2018 1123   GLUCOSE 135 (H) 06/23/2018 1321   BUN 19 11/08/2018 1123   CREATININE 1.81 (H) 11/08/2018 1123   CALCIUM 9.5 11/08/2018 1123   GFRNONAA 39 (L) 11/08/2018 1123   GFRAA 45 (L) 11/08/2018 1123   Renal function: Estimated Creatinine Clearance: 38 mL/min (A) (by C-G formula based on SCr of 1.81 mg/dL (H)).  Clinical ASCVD: Yes  The ASCVD Risk score Mikey Bussing DC Jr., et al., 2013) failed to calculate for the following reasons:   The patient has a prior MI or stroke diagnosis  A/P: Hypertension longstanding currently at goal, borderline low on current medications. BP Goal = <130/80 mmHg given multiple stroke hx, control is paramount. Patient is adherent with current medications. Consulted with PCP verbally concerning pt's BP. Will advise to take BP meds at bedtime and increase hydration.  -Continued regimen.  -Switch to nighttime dosing. -Hydration emphasized. -Counseled on lifestyle modifications for  blood pressure control including reduced dietary sodium, increased exercise, adequate sleep  Results reviewed and written information provided.   Total time in face-to-face counseling 15 minutes.   F/U Clinic Visit in October with PCP.   Benard Halsted, PharmD, [place] [phone]

## 2018-12-03 ENCOUNTER — Ambulatory Visit: Payer: Medicaid Other | Admitting: Neurology

## 2018-12-11 ENCOUNTER — Other Ambulatory Visit: Payer: Self-pay

## 2018-12-11 ENCOUNTER — Ambulatory Visit (AMBULATORY_SURGERY_CENTER): Payer: Self-pay | Admitting: *Deleted

## 2018-12-11 VITALS — Temp 97.1°F | Ht 67.0 in | Wt 147.0 lb

## 2018-12-11 DIAGNOSIS — Z1211 Encounter for screening for malignant neoplasm of colon: Secondary | ICD-10-CM

## 2018-12-11 MED ORDER — PEG 3350-KCL-NA BICARB-NACL 420 G PO SOLR: 4000.0000 mL | Freq: Once | ORAL | 0 refills | Status: AC

## 2018-12-11 NOTE — Progress Notes (Signed)
Patient is here in-person for PV. Patient's daughter came into PV to listen to prep instructions. Patient denies any allergies to eggs or soy. Patient denies any problems with anesthesia/sedation. Patient denies any oxygen use at home. Patient denies taking any diet/weight loss medications or blood thinners. Patient is not being treated for MRSA or C-diff. EMMI education assisgned to patient on colonoscopy, this was explained and instructions given to patient.   I encouraged patient and daughter to make sure he drinks a lot during the day before prep to prevent dehydration.   Pt and pt's daughter is aware that care partner will wait in the car during procedure; if they feel like they will be too hot to wait in the car; they may wait in the lobby.  We want them to wear a mask (we do not have any that we can provide them), practice social distancing, and we will check their temperatures when they get here.  I did remind patient that their care partner needs to stay in the parking lot the entire time. Pt will wear mask into building.

## 2018-12-19 ENCOUNTER — Other Ambulatory Visit: Payer: Self-pay

## 2018-12-19 DIAGNOSIS — R7989 Other specified abnormal findings of blood chemistry: Secondary | ICD-10-CM

## 2018-12-20 ENCOUNTER — Telehealth: Payer: Self-pay | Admitting: Nurse Practitioner

## 2018-12-20 ENCOUNTER — Other Ambulatory Visit: Payer: Self-pay

## 2018-12-20 ENCOUNTER — Ambulatory Visit: Payer: PPO | Attending: Family Medicine

## 2018-12-20 DIAGNOSIS — Z23 Encounter for immunization: Secondary | ICD-10-CM | POA: Diagnosis not present

## 2018-12-20 DIAGNOSIS — R7989 Other specified abnormal findings of blood chemistry: Secondary | ICD-10-CM | POA: Diagnosis not present

## 2018-12-20 NOTE — Telephone Encounter (Signed)
Thomas [name] with [facility] called to check on the order for the patient states it was sent over 4/10 and called to check on it 08/13 and has not received a response. Please follow up.

## 2018-12-20 NOTE — Telephone Encounter (Signed)
Spoke to [facility] and had he refax the order.  Received the order from the fax will placed it in the box for PCP.

## 2018-12-21 ENCOUNTER — Other Ambulatory Visit: Payer: Self-pay | Admitting: Internal Medicine

## 2018-12-21 LAB — COMPREHENSIVE METABOLIC PANEL
ALT: 16 IU/L (ref 0–44)
AST: 15 IU/L (ref 0–40)
Albumin/Globulin Ratio: 1.2 (ref 1.2–2.2)
Albumin: 4.4 g/dL (ref 3.8–4.8)
Alkaline Phosphatase: 135 IU/L — ABNORMAL HIGH (ref 39–117)
BUN/Creatinine Ratio: 10 (ref 10–24)
BUN: 18 mg/dL (ref 8–27)
Bilirubin Total: 0.4 mg/dL (ref 0.0–1.2)
CO2: 24 mmol/L (ref 20–29)
Calcium: 9.7 mg/dL (ref 8.6–10.2)
Chloride: 99 mmol/L (ref 96–106)
Creatinine, Ser: 1.78 mg/dL — ABNORMAL HIGH (ref 0.76–1.27)
GFR calc Af Amer: 45 mL/min/{1.73_m2} — ABNORMAL LOW (ref >59)
GFR calc non Af Amer: 39 mL/min/{1.73_m2} — ABNORMAL LOW (ref >59)
Globulin, Total: 3.7 g/dL (ref 1.5–4.5)
Glucose: 78 mg/dL (ref 65–99)
Potassium: 4.8 mmol/L (ref 3.5–5.2)
Sodium: 136 mmol/L (ref 134–144)
Total Protein: 8.1 g/dL (ref 6.0–8.5)

## 2018-12-21 LAB — CBC
Hematocrit: 44.2 % (ref 37.5–51.0)
Hemoglobin: 14.6 g/dL (ref 13.0–17.7)
MCH: 25.7 pg — ABNORMAL LOW (ref 26.6–33.0)
MCHC: 33 g/dL (ref 31.5–35.7)
MCV: 78 fL — ABNORMAL LOW (ref 79–97)
Platelets: 334 10*3/uL (ref 150–450)
RBC: 5.69 x10E6/uL (ref 4.14–5.80)
RDW: 15.1 % (ref 11.6–15.4)
WBC: 10.8 10*3/uL (ref 3.4–10.8)

## 2018-12-21 LAB — LIPID PANEL
Chol/HDL Ratio: 3.4 ratio (ref 0.0–5.0)
Cholesterol, Total: 165 mg/dL (ref 100–199)
HDL: 48 mg/dL (ref >39)
LDL Chol Calc (NIH): 100 mg/dL — ABNORMAL HIGH (ref 0–99)
Triglycerides: 92 mg/dL (ref 0–149)
VLDL Cholesterol Cal: 17 mg/dL (ref 5–40)

## 2018-12-24 ENCOUNTER — Telehealth: Payer: Self-pay

## 2018-12-24 NOTE — Telephone Encounter (Signed)
Covid-19 screening questions   Do you now or have you had a fever in the last 14 days? NO   Do you have any respiratory symptoms of shortness of breath or cough now or in the last 14 days? NO  Do you have any family members or close contacts with diagnosed or suspected Covid-19 in the past 14 days? NO  Have you been tested for Covid-19 and found to be positive? NO        

## 2018-12-25 ENCOUNTER — Encounter: Payer: Self-pay | Admitting: Gastroenterology

## 2018-12-25 ENCOUNTER — Ambulatory Visit (AMBULATORY_SURGERY_CENTER): Payer: PPO | Admitting: Gastroenterology

## 2018-12-25 ENCOUNTER — Other Ambulatory Visit: Payer: Self-pay

## 2018-12-25 VITALS — BP 117/65 | HR 63 | Temp 98.6°F | Resp 15 | Ht 67.0 in | Wt 147.0 lb

## 2018-12-25 DIAGNOSIS — Z1211 Encounter for screening for malignant neoplasm of colon: Secondary | ICD-10-CM

## 2018-12-25 DIAGNOSIS — D123 Benign neoplasm of transverse colon: Secondary | ICD-10-CM | POA: Diagnosis not present

## 2018-12-25 MED ORDER — SODIUM CHLORIDE 0.9 % IV SOLN: 500.0000 mL | Freq: Once | INTRAVENOUS | Status: DC

## 2018-12-25 NOTE — Op Note (Addendum)
[facility] Patient Name: Thomas [name] Procedure Date: 12/25/2018 8:35 AM MRN: [id number] Endoscopist: Mauri Pole , MD Age: 63 Referring MD:  Date of Birth: July 19, 1955 Gender: Male Account #: [id number] Procedure:                Colonoscopy Indications:              Screening for colorectal malignant neoplasm Medicines:                Monitored Anesthesia Care Procedure:                Pre-Anesthesia Assessment:                           - Prior to the procedure, a History and Physical                            was performed, and patient medications and                            allergies were reviewed. The patient's tolerance of                            previous anesthesia was also reviewed. The risks                            and benefits of the procedure and the sedation                            options and risks were discussed with the patient.                            All questions were answered, and informed consent                            was obtained. Prior Anticoagulants: The patient has                            taken no previous anticoagulant or antiplatelet                            agents. ASA Grade Assessment: III - A patient with                            severe systemic disease. After reviewing the risks                            and benefits, the patient was deemed in                            satisfactory condition to undergo the procedure.                           After obtaining informed consent, the colonoscope  was passed under direct vision. Throughout the                            procedure, the patient's blood pressure, pulse, and                            oxygen saturations were monitored continuously. The                            Colonoscope was introduced through the anus and                            advanced to the the cecum, identified by                            appendiceal orifice and  ileocecal valve. The                            colonoscopy was performed without difficulty. The                            patient tolerated the procedure well. The quality                            of the bowel preparation was good. The ileocecal                            valve, appendiceal orifice, and rectum were                            photographed. Due to technical error, no images                            were saved Scope In: 8:47:24 AM Scope Out: 9:12:01 AM Scope Withdrawal Time: 0 hours 21 minutes 23 seconds  Total Procedure Duration: 0 hours 24 minutes 37 seconds  Findings:                 The perianal and digital rectal examinations were                            normal.                           A 16 mm polyp was found in the transverse colon.                            The polyp was sessile. The polyp was removed with a                            piecemeal technique using a cold snare. Resection                            and retrieval were complete.  A 8 mm polyp was found in the transverse colon. The                            polyp was sessile. The polyp was removed with a                            cold snare. Resection and retrieval were complete.                           A few small-mouthed diverticula were found in the                            sigmoid colon and descending colon.                           Non-bleeding internal hemorrhoids were found during                            retroflexion. The hemorrhoids were small. Complications:            No immediate complications. Estimated Blood Loss:     Estimated blood loss was minimal. Impression:               - One 16 mm polyp in the transverse colon, removed                            piecemeal using a cold snare. Resected and                            retrieved.                           - One 8 mm polyp in the transverse colon, removed                            with a cold  snare. Resected and retrieved.                           - Diverticulosis in the sigmoid colon and in the                            descending colon.                           - Non-bleeding internal hemorrhoids. Recommendation:           - Patient has a contact number available for                            emergencies. The signs and symptoms of potential                            delayed complications were discussed with the  patient. Return to normal activities tomorrow.                            Written discharge instructions were provided to the                            patient.                           - Resume previous diet.                           - Continue present medications.                           - Await pathology results.                           - Repeat colonoscopy in 3 years for surveillance                            based on pathology results. Mauri Pole, MD 12/25/2018 9:19:36 AM This report has been signed electronically.

## 2018-12-25 NOTE — Patient Instructions (Signed)
YOU HAD AN ENDOSCOPIC PROCEDURE TODAY AT THE [facility]:   Refer to the procedure report that was given to you for any specific questions about what was found during the examination.  If the procedure report does not answer your questions, please call your gastroenterologist to clarify.  If you requested that your care partner not be given the details of your procedure findings, then the procedure report has been included in a sealed envelope for you to review at your convenience later.  YOU SHOULD EXPECT: Some feelings of bloating in the abdomen. Passage of more gas than usual.  Walking can help get rid of the air that was put into your GI tract during the procedure and reduce the bloating. If you had a lower endoscopy (such as a colonoscopy or flexible sigmoidoscopy) you may notice spotting of blood in your stool or on the toilet paper. If you underwent a bowel prep for your procedure, you may not have a normal bowel movement for a few days.  Please Note:  You might notice some irritation and congestion in your nose or some drainage.  This is from the oxygen used during your procedure.  There is no need for concern and it should clear up in a day or so.  SYMPTOMS TO REPORT IMMEDIATELY:   Following lower endoscopy (colonoscopy or flexible sigmoidoscopy):  Excessive amounts of blood in the stool  Significant tenderness or worsening of abdominal pains  Swelling of the abdomen that is new, acute  Fever of 100F or higher  For urgent or emergent issues, a gastroenterologist can be reached at any hour by calling [phone].   DIET:  We do recommend a small meal at first, but then you may proceed to your regular diet.  Drink plenty of fluids but you should avoid alcoholic beverages for 24 hours.  ACTIVITY:  You should plan to take it easy for the rest of today and you should NOT DRIVE or use heavy machinery until tomorrow (because of the sedation medicines used during the test).     FOLLOW UP: Our staff will call the number listed on your records 48-72 hours following your procedure to check on you and address any questions or concerns that you may have regarding the information given to you following your procedure. If we do not reach you, we will leave a message.  We will attempt to reach you two times.  During this call, we will ask if you have developed any symptoms of COVID 19. If you develop any symptoms (ie: fever, flu-like symptoms, shortness of breath, cough etc.) before then, please call [phone].  If you test positive for Covid 19 in the 2 weeks post procedure, please call and report this information to us.    If any biopsies were taken you will be contacted by phone or by letter within the next 1-3 weeks.  Please call us at [phone] if you have not heard about the biopsies in 3 weeks.    SIGNATURES/CONFIDENTIALITY: You and/or your care partner have signed paperwork which will be entered into your electronic medical record.  These signatures attest to the fact that that the information above on your After Visit Summary has been reviewed and is understood.  Full responsibility of the confidentiality of this discharge information lies with you and/or your care-partner. 

## 2018-12-25 NOTE — Progress Notes (Signed)
Temperature- Thomas [name]  Pt's states no medical or surgical changes since previsit or office visit.  Daughter brought back to admitting to help with questions.   Called to room to assist during endoscopic procedure.  Patient ID and intended procedure confirmed with present staff. Received instructions for my participation in the procedure from the performing physician.

## 2018-12-25 NOTE — Progress Notes (Signed)
A/ox3, pleased with MAC, report to RN 

## 2018-12-27 ENCOUNTER — Telehealth: Payer: Self-pay

## 2018-12-27 NOTE — Telephone Encounter (Signed)
  Follow up Call-  Call back number 12/25/2018  Post procedure Call Back phone  # [phone]  Permission to leave phone message Yes  Some recent data might be hidden     Patient questions:  Do you have a fever, pain , or abdominal swelling? No. Pain Score  0 *  Have you tolerated food without any problems? Yes.    Have you been able to return to your normal activities? Yes.    Do you have any questions about your discharge instructions: Diet   No. Medications  No. Follow up visit  No.  Do you have questions or concerns about your Care? No.  Actions: * If pain score is 4 or above: No action needed, pain <4.  1. Have you developed a fever since your procedure? no  2.   Have you had an respiratory symptoms (SOB or cough) since your procedure? no  3.   Have you tested positive for COVID 19 since your procedure no  4.   Have you had any family members/close contacts diagnosed with the COVID 19 since your procedure?  no   If yes to any of these questions please route to Joylene John, RN and Alphonsa Gin, Therapist, sports.

## 2019-01-03 ENCOUNTER — Encounter: Payer: Self-pay | Admitting: Gastroenterology

## 2019-01-08 ENCOUNTER — Ambulatory Visit: Payer: PPO | Admitting: Nurse Practitioner

## 2019-01-15 ENCOUNTER — Ambulatory Visit (INDEPENDENT_AMBULATORY_CARE_PROVIDER_SITE_OTHER): Payer: PPO | Admitting: Neurology

## 2019-01-15 ENCOUNTER — Encounter: Payer: Self-pay | Admitting: Neurology

## 2019-01-15 ENCOUNTER — Other Ambulatory Visit: Payer: Self-pay

## 2019-01-15 VITALS — BP 110/71 | HR 72 | Temp 97.7°F | Ht 67.0 in | Wt 148.2 lb

## 2019-01-15 DIAGNOSIS — I6381 Other cerebral infarction due to occlusion or stenosis of small artery: Secondary | ICD-10-CM | POA: Diagnosis not present

## 2019-01-15 DIAGNOSIS — R42 Dizziness and giddiness: Secondary | ICD-10-CM | POA: Diagnosis not present

## 2019-01-15 DIAGNOSIS — H8111 Benign paroxysmal vertigo, right ear: Secondary | ICD-10-CM

## 2019-01-15 MED ORDER — ASPIRIN EC 81 MG PO TBEC: 81.0000 mg | DELAYED_RELEASE_TABLET | Freq: Every day | ORAL | 2 refills | Status: AC

## 2019-01-15 NOTE — Patient Instructions (Signed)
I had a long d/w patient and his daughter about his recent lacunar stroke, chronic dizziness and benign paroxysmal positional vertigo, risk for recurrent stroke/TIAs, personally independently reviewed imaging studies and stroke evaluation results and answered questions.Continue aspirin for secondary stroke prevention but reduce the dose to 81 mg daily due to nasal bleeds and maintain strict control of hypertension with blood pressure goal below 130/90, diabetes with hemoglobin A1c goal below 6.5% and lipids with LDL cholesterol goal below 70 mg/dL. I also advised the patient to eat a healthy diet with plenty of whole grains, cereals, fruits and vegetables, exercise regularly and maintain ideal body weight.referral to physical therapy for vestibular stabilization exercises and have advised him to get up slowly and avoid sudden movements to the right.  Followup in the future with my nurse practitioner in 6 months or call earlier if necessary.  Benign Positional Vertigo Vertigo is the feeling that you or your surroundings are moving when they are not. Benign positional vertigo is the most common form of vertigo. This is usually a harmless condition (benign). This condition is positional. This means that symptoms are triggered by certain movements and positions. This condition can be dangerous if it occurs while you are doing something that could cause harm to you or others. This includes activities such as driving or operating machinery. What are the causes? In many cases, the cause of this condition is not known. It may be caused by a disturbance in an area of the inner ear that helps your brain to sense movement and balance. This disturbance can be caused by:  Viral infection (labyrinthitis).  Head injury.  Repetitive motion, such as jumping, dancing, or running. What increases the risk? You are more likely to develop this condition if:  You are a woman.  You are 50 years of age or older. What are  the signs or symptoms? Symptoms of this condition usually happen when you move your head or your eyes in different directions. Symptoms may start suddenly, and usually last for less than a minute. They include:  Loss of balance and falling.  Feeling like you are spinning or moving.  Feeling like your surroundings are spinning or moving.  Nausea and vomiting.  Blurred vision.  Dizziness.  Involuntary eye movement (nystagmus). Symptoms can be mild and cause only minor problems, or they can be severe and interfere with daily life. Episodes of benign positional vertigo may return (recur) over time. Symptoms may improve over time. How is this diagnosed? This condition may be diagnosed based on:  Your medical history.  Physical exam of the head, neck, and ears.  Tests, such as: ? MRI. ? CT scan. ? Eye movement tests. Your health care provider may ask you to change positions quickly while he or she watches you for symptoms of benign positional vertigo, such as nystagmus. Eye movement may be tested with a variety of exams that are designed to evaluate or stimulate vertigo. ? An electroencephalogram (EEG). This records electrical activity in your brain. ? Hearing tests. You may be referred to a health care provider who specializes in ear, nose, and throat (ENT) problems (otolaryngologist) or a provider who specializes in disorders of the nervous system (neurologist). How is this treated?  This condition may be treated in a session in which your health care provider moves your head in specific positions to adjust your inner ear back to normal. Treatment for this condition may take several sessions. Surgery may be needed in severe cases, but this  is rare. In some cases, benign positional vertigo may resolve on its own in 2-4 weeks. Follow these instructions at home: Safety  Move slowly. Avoid sudden body or head movements or certain positions, as told by your health care provider.   Avoid driving until your health care provider says it is safe for you to do so.  Avoid operating heavy machinery until your health care provider says it is safe for you to do so.  Avoid doing any tasks that would be dangerous to you or others if vertigo occurs.  If you have trouble walking or keeping your balance, try using a cane for stability. If you feel dizzy or unstable, sit down right away.  Return to your normal activities as told by your health care provider. Ask your health care provider what activities are safe for you. General instructions  Take over-the-counter and prescription medicines only as told by your health care provider.  Drink enough fluid to keep your urine pale yellow.  Keep all follow-up visits as told by your health care provider. This is important. Contact a health care provider if:  You have a fever.  Your condition gets worse or you develop new symptoms.  Your family or friends notice any behavioral changes.  You have nausea or vomiting that gets worse.  You have numbness or a "pins and needles" sensation. Get help right away if you:  Have difficulty speaking or moving.  Are always dizzy.  Faint.  Develop severe headaches.  Have weakness in your legs or arms.  Have changes in your hearing or vision.  Develop a stiff neck.  Develop sensitivity to light. Summary  Vertigo is the feeling that you or your surroundings are moving when they are not. Benign positional vertigo is the most common form of vertigo.  The cause of this condition is not known. It may be caused by a disturbance in an area of the inner ear that helps your brain to sense movement and balance.  Symptoms include loss of balance and falling, feeling that you or your surroundings are moving, nausea and vomiting, and blurred vision.  This condition can be diagnosed based on symptoms, physical exam, and other tests, such as MRI, CT scan, eye movement tests, and hearing tests.   Follow safety instructions as told by your health care provider. You will also be told when to contact your health care provider in case of problems. This information is not intended to replace advice given to you by your health care provider. Make sure you discuss any questions you have with your health care provider. Document Released: 12/19/2005 Document Revised: 08/22/2017 Document Reviewed: 08/22/2017 Elsevier Patient Education  2020 [employer].

## 2019-01-15 NOTE — Progress Notes (Signed)
[facility] [address] [place]. [place] [address] [phone]       OFFICE FOLLOW-UP NOTE  Mr. Asmar Certo Date of Birth:  10/06/1955 Medical Record Number:  [id number]   HPI: virtual video visit 08/28/2018 : Mr. Khmari is a 63 year old Asian male seen today for initial office follow-up visit following hospital admission for stroke in March 2020.  History is obtained from him and review of electronic medical records.  I personally reviewed imaging films.Lovell Nieis an 63 y.o.maleYbuom Nieis an 63 y.o.maleMr. Daffin is a 63 yo man with a medical history ofmultiplestrokes, but minimal residual effect ofchronic right leg pain and bilateral lower extremity numbness(mRS 1),HTN, PVD,ruptured AAA s/p repair, and thoracic aortic atherosclerosis. On 3/29 hepresented to the ED with dizziness (which is normal for him for many years), but new onset ofvomiting that started this morning. He denies that there is any "new" quality to this usual dizziness.The dizziness improves when he closes his eyes. He denies vision changes or headache. He had 2-3 episodes of vomiting at home and a few more episodes in the hospital. Hestates he feelsgenerally unwell and generalweakness, but no focal weakness or falls. He takes full dose aspirin daily.MRI brain was done, whichincidentallyshows a new ischemic stroke on the right lateral temporal horn of ventricle.There is no clear onset time of stroke nor clinical symptoms, thus no stroke interventions offered .  CT scan of the head on admission was negative for acute abnormality but MRI scan showed new tiny ischemic stroke around the right lateral temporal horn of the ventricle.  CT angiogram of the head and neck showed severe intracranial atherosclerotic disease throughout with right vertebral occlusion and chronic right M1 middle cerebral artery occlusion.  There was mild bilateral carotid atherosclerosis but no significant stenosis.  Transthoracic  echo showed normal ejection fraction.  LDL cholesterol was elevated at 144 mg percent.  Urine drug screen was negative.  Hemoglobin A1c was 5.4.  Patient was started on dual antiplatelet therapy aspirin and Plavix.  He overall felt well and dizziness improved.  He had chronic hearing loss in the left ear which is unchanged.  His blood pressure remained on the lower side during the hospitalization.  It was felt that this tiny right temporal white matter infarct was incidental due to failure of collaterals circulations with chronic right MCA occlusion in the setting of dehydration and hypotension.  Patient states he is done well since discharge.  His dizziness has improved.  He has had no further stroke or TIA symptoms.  He is remains on aspirin and Plavix which is tolerating well without bruising or bleeding.  He states his blood pressure is under good control.  Is tolerating Lipitor without muscle aches or pains.  He has no new complaints today. Update 01/15/2019 ;  Mr. Leisner is a pleasant 63 year old Asian male who is seen today for office follow-up visit following hospital admission for stroke.  He is accompanied by his daughter who translates for him.  He was last seen by me for a virtual video visit on 08/28/2018.  He states he has done well since then has not had any recurrent stroke or TIA symptoms.  His main complaint today is his chronic positional dizziness.  He has had this for years.  Notices this when he bends down a certain way or turns suddenly to one side.  He feels off balance and has to catch himself.  He denies accompanying nausea vomiting.  He does have some decreased hearing in  his left ear over the years.  He denies tinnitus, significant head injury, ear pain injury or discharge.  Patient has been tolerating aspirin but has had a few episodes of nasal bleed.  Takes aspirin 325 mg daily.  His blood pressure is well controlled and today it is 110/71.  He is on Norvasc and Cozaar.  Is tolerating  Lipitor 20 mg daily well without muscle aches and pains.  He continues to have some dragging of his right leg and has been using a cane to walk though he is careful and has had no falls or injuries.  He has not done any vestibular stabilization exercises for his positional vertigo. ROS:   14 system review of systems is positive for dizziness, vertigo, imbalance, decreased hearing, gait difficulties all other systems negative PMH:  Past Medical History:  Diagnosis Date   Chronic kidney disease    Hyperlipidemia    Hypertension    Occlusion of right middle cerebral artery    PVD (peripheral vascular disease) (HCC)    Ruptured abdominal aortic aneurysm (AAA) ([place])    Stroke ([place]) 06/2017   Thoracic aortic aneurysm (TAA) (HCC)     Social History:  Social History   Socioeconomic History   Marital status: Divorced    Spouse name: Not on file   Number of children: Not on file   Years of education: Not on file   Highest education level: Not on file  Occupational History   Not on file  Social Needs   Financial resource strain: Not on file   Food insecurity    Worry: Not on file    Inability: Not on file   Transportation needs    Medical: Not on file    Non-medical: Not on file  Tobacco Use   Smoking status: Former Smoker    Types: Cigarettes    Quit date: 07/16/2017    Years since quitting: 1.5   Smokeless tobacco: Never Used  Substance and Sexual Activity   Alcohol use: No   Drug use: No   Sexual activity: Not Currently  Lifestyle   Physical activity    Days per week: Not on file    Minutes per session: Not on file   Stress: Not on file  Relationships   Social connections    Talks on phone: Not on file    Gets together: Not on file    Attends religious service: Not on file    Active member of club or organization: Not on file    Attends meetings of clubs or organizations: Not on file    Relationship status: Not on file   Intimate partner  violence    Fear of current or ex partner: Not on file    Emotionally abused: Not on file    Physically abused: Not on file    Forced sexual activity: Not on file  Other Topics Concern   Not on file  Social History Narrative   Not on file    Medications:   Current Outpatient Medications on File Prior to Visit  Medication Sig Dispense Refill   amLODipine (NORVASC) 5 MG tablet Take 1 tablet (5 mg total) by mouth daily. 90 tablet 3   atorvastatin (LIPITOR) 20 MG tablet Take 1 tablet (20 mg total) by mouth daily at 6 PM. 90 tablet 1   losartan (COZAAR) 50 MG tablet Take 1 tablet (50 mg total) by mouth daily. 90 tablet 3   omeprazole (PRILOSEC) 20 MG capsule Take 1 capsule (  20 mg total) by mouth daily. 30 capsule 2   No current facility-administered medications on file prior to visit.     Allergies:  No Known Allergies  Physical Exam General: Frail middle-aged Asian male, seated, in no evident distress Head: head normocephalic and atraumatic.  Neck: supple with no carotid or supraclavicular bruits Cardiovascular: regular rate and rhythm, no murmurs Musculoskeletal: no deformity Skin:  no rash/petichiae Vascular:  Normal pulses all extremities Vitals:   01/15/19 0909  BP: 110/71  Pulse: 72  Temp: 97.7 F (36.5 C)   Neurologic Exam Mental Status: Awake and fully alert. Oriented to place and time. Recent and remote memory intact. Attention span, concentration and fund of knowledge appropriate. Mood and affect appropriate.  Cranial Nerves: Fundoscopic exam reveals sharp disc margins. Pupils equal, briskly reactive to light. Extraocular movements full without nystagmus. Visual fields full to confrontation. Hearing intact. Facial sensation intact. Face, tongue, palate moves normally and symmetrically.  Motor: Normal bulk and tone. Normal strength in all tested extremity muscles. Sensory.: intact to touch ,pinprick .position and vibratory sensation.  Coordination: Rapid  alternating movements normal in all extremities. Finger-to-nose and heel-to-shin performed accurately bilaterally.  Fukuda stepping test is positive with patient moving off base and rotating to the right.  Hallpike maneuver was not done.  Headshaking does not produce dizziness or nystagmus Gait and Station: Arises from chair without difficulty. Stance is normal. Gait demonstrates normal stride length and balance but there is slight dragging of the right leg..  Unable to heel, toe and tandem walk without difficulty.  Reflexes: 1+ and symmetric. Toes downgoing.   NIHSS  0 Modified Rankin  1   ASSESSMENT:63 year old Asian male with history of multiple remote strokes with minimal residual chronic right leg numbness who presented on 06/23/2018 with exacerbation of chronic dizziness in the setting of new onset of vomiting and dehydration and hypotension.  His right temporal periventricular white matter stroke was likely incidental in the setting of failure of collaterals from dehydration and hypertension with known chronic right MCA occlusion.  He also has chronic positional dizziness likely due to benign paroxysmal positional vertigo.     PLAN: I had a long d/w patient and his daughter about his recent lacunar stroke, chronic dizziness and benign paroxysmal positional vertigo, risk for recurrent stroke/TIAs, personally independently reviewed imaging studies and stroke evaluation results and answered questions.Continue aspirin for secondary stroke prevention but reduce the dose to 81 mg daily due to nasal bleeds and maintain strict control of hypertension with blood pressure goal below 130/90, diabetes with hemoglobin A1c goal below 6.5% and lipids with LDL cholesterol goal below 70 mg/dL. I also advised the patient to eat a healthy diet with plenty of whole grains, cereals, fruits and vegetables, exercise regularly and maintain ideal body weight.referral to physical therapy for vestibular stabilization  exercises and have advised him to get up slowly and avoid sudden movements to the right.  Followup in the future with my nurse practitioner in 6 months or call earlier if necessary. Greater than 50% of time during this 25 minute visit was spent on counseling,explanation of diagnosis of lacunar stroke and vertigo, planning of further management, discussion with patient and family and coordination of care Antony Contras, MD  [facility] [address] [place], [place] [address]  Phone [phone] Fax [phone] Note: This document was prepared with digital dictation and possible smart phrase technology. Any transcriptional errors that result from this process are unintentional

## 2019-01-21 ENCOUNTER — Ambulatory Visit: Payer: PPO | Admitting: Nurse Practitioner

## 2019-02-28 ENCOUNTER — Ambulatory Visit: Payer: PPO | Admitting: Nurse Practitioner

## 2019-04-03 IMAGING — CT CT ANGIO CHEST-ABD-PELV FOR DISSECTION W/ AND WO/W CM
2 of 7 series · 10 of 46 positions shown, 11 images · IV contrast (OMNI 350)
Comparison: CT 07/01/2017

CLINICAL DATA: 61-year-old with an abdominal aortic aneurysm and
abdominal hematoma. Follow-up aneurysm. Patient is hypotensive and
concern for new or increased intra-abdominal bleeding.

EXAM:
CT ANGIOGRAPHY CHEST, ABDOMEN AND PELVIS
TECHNIQUE: Multidetector CT imaging through the chest, abdomen and pelvis was
performed using the standard protocol during bolus administration of
intravenous contrast. Multiplanar reconstructed images and MIPs were
obtained and reviewed to evaluate the vascular anatomy.
CONTRAST:  75mL J9W7CZ-ITC IOPAMIDOL (J9W7CZ-ITC) INJECTION 76%,
<See Chart> J9W7CZ-ITC IOPAMIDOL (J9W7CZ-ITC) INJECTION 76%

[Series 8: dissection 2mm · axial · 0.66mm/px · z∈[+898,+1394]mm · 7 of 316 slices shown, 8 images]
[im 34/316  soft-tissue]
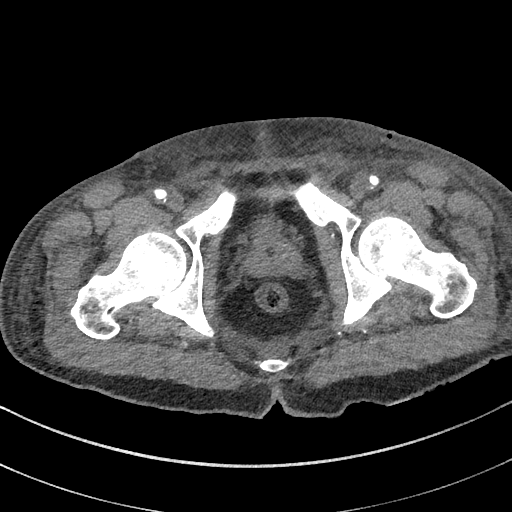
[im 34/316  bone]
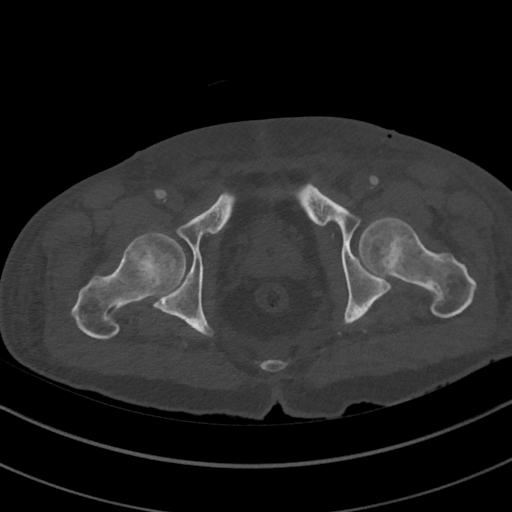
[im 67/316  soft-tissue]
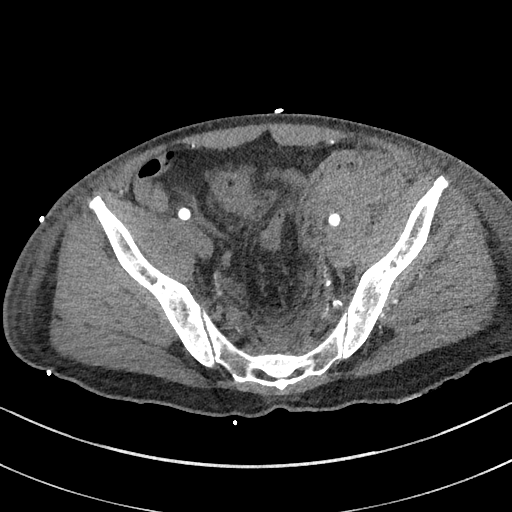
[im 117/316  soft-tissue]
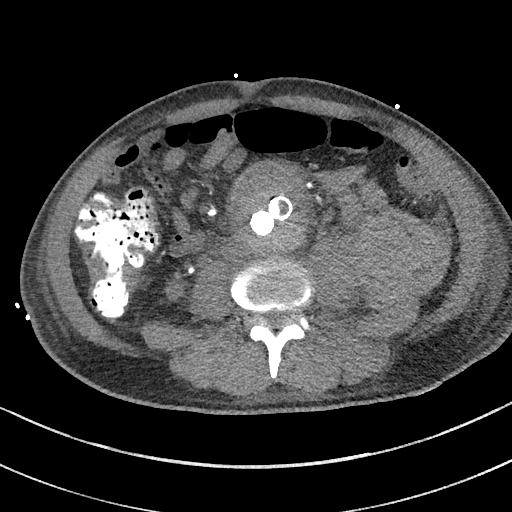
[im 166/316  soft-tissue]
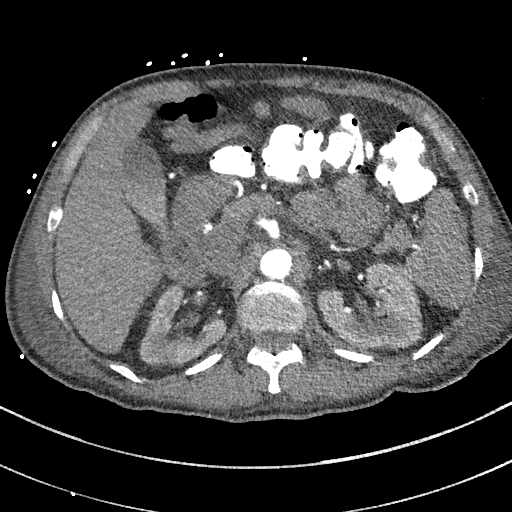
[im 199/316  soft-tissue]
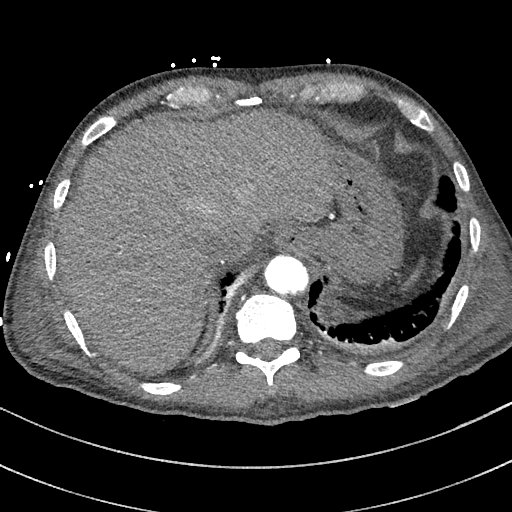
[im 249/316  soft-tissue]
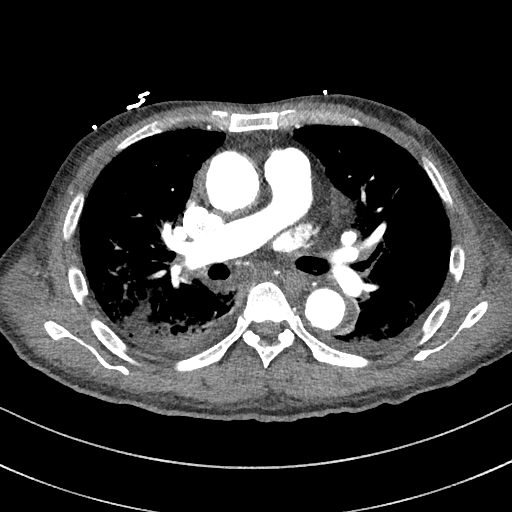
[im 282/316  soft-tissue]
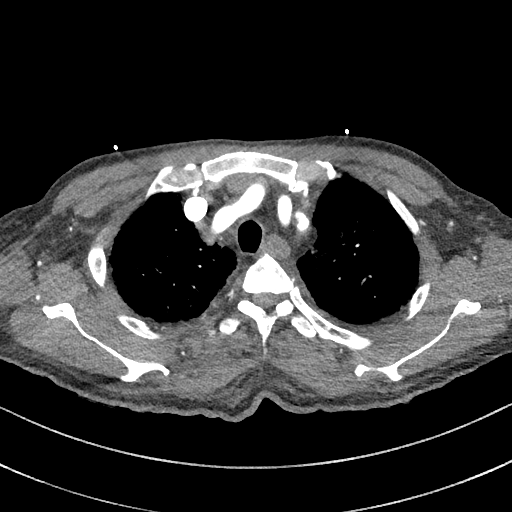

[Series 11: dissection 2mm cor · coronal · 0.75mm/px · 3 of 146 slices shown]
[im 37/146  soft-tissue]
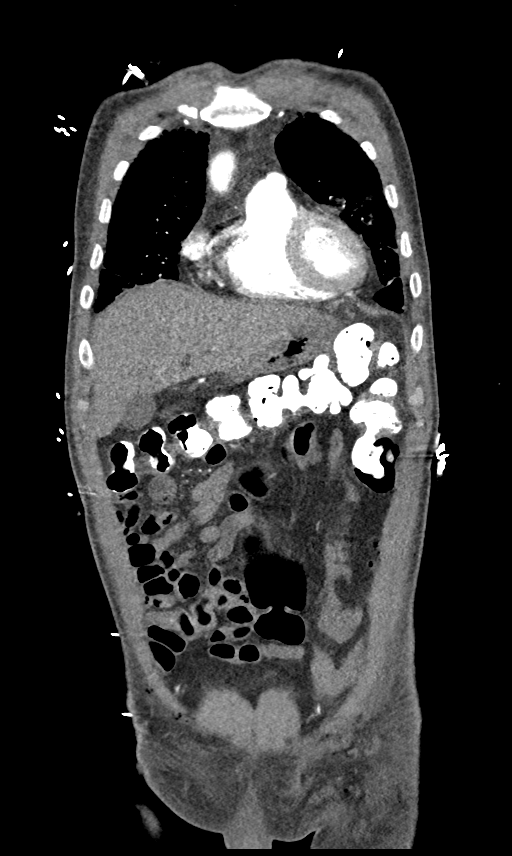
[im 73/146  soft-tissue]
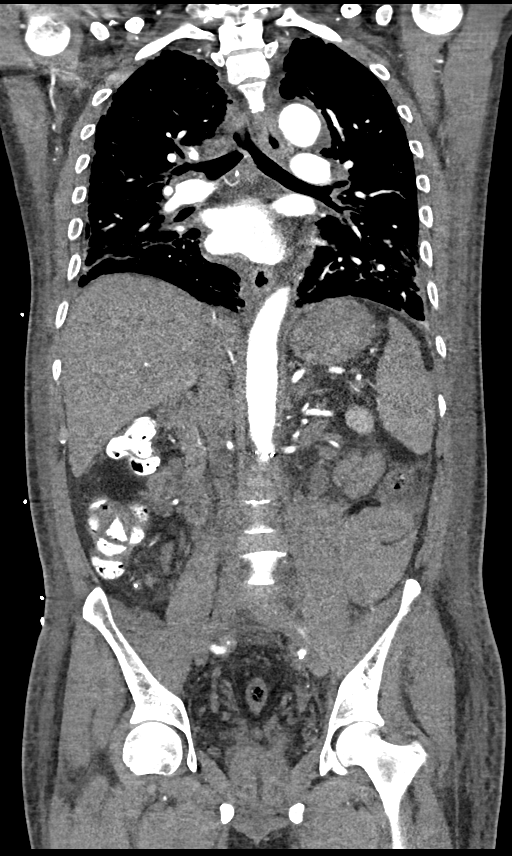
[im 109/146  soft-tissue]
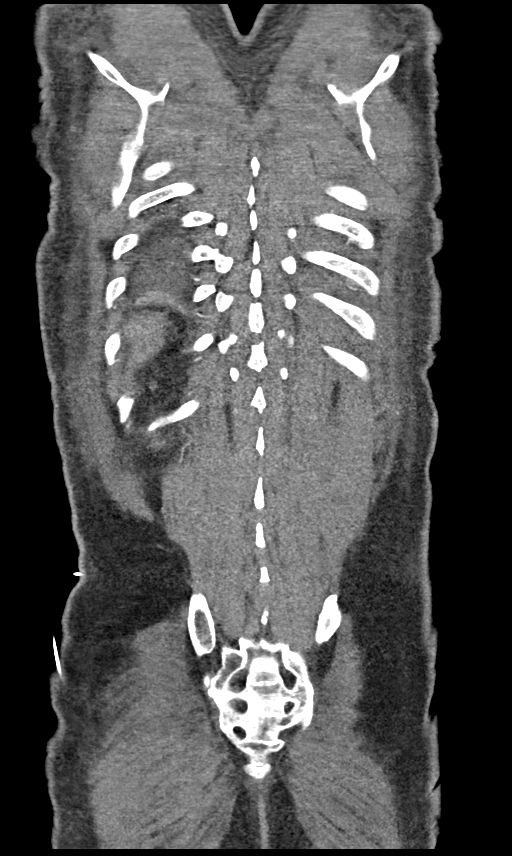

[10 of 46 positions shown; findings below may reference images not displayed]

FINDINGS: CTA CHEST FINDINGS

Cardiovascular: Coronary artery calcifications. The size of the
ascending thoracic aorta is better calculated on the noncontrast
images due to peripheral mural thrombus. The ascending thoracic
aorta measures at least 4.4 cm. Evidence for irregular mural
thrombus along the posterolateral wall of the ascending thoracic
aorta. There is also eccentric thrombus at the aortic arch.
Difficult to measure the true lumen of the descending thoracic aorta
due to low-density material along the left side of the descending
thoracic aorta which could represent mural thrombus and/or edema in
this area. No clear evidence for intramural hematoma or dissection
involving the thoracic aorta. Heart size is within normal limits. No
significant pericardial fluid. Main and central pulmonary arteries
are patent. Great vessels are patent. There is irregular plaque
involving the proximal left subclavian artery.

Mediastinum/Nodes: Gas adjacent to the right internal jugular vein
compatible with recent catheterization. Low-density material
throughout the mediastinum compatible with edema. Question a
precarinal lymph node measuring 1.7 cm in the short axis on sequence
8, image 54. There may be a few prominent prevascular lymph nodes.

Lungs/Pleura: Trace bilateral pleural fluid. Centrilobular
emphysema. A small amount of mucus in the mainstem bronchi. Diffuse
patchy airspace disease with areas of interstitial thickening.
Findings are suggestive for pulmonary edema but cannot exclude
widespread infectious or inflammatory process.

Musculoskeletal: No acute abnormality.

Review of the MIP images confirms the above findings.

CTA ABDOMEN AND PELVIS FINDINGS

VASCULAR

Aorta: Endovascular repair of the abdominal aortic aneurysm. Stent
graft is positioned below the renal veins. The main body of the
stent graft is patent but there is a high-grade stenosis involving
the right graft limb. Stenosis measures roughly 60-70%. Stenosis is
related to a large amount of thrombus within the stent limb. The
left stent limb is widely patent. There is pocket of gas anterior to
the right limb and probably related to recent surgery. Aneurysm sac
has minimally changed in size measuring 6.5 x 5.9 cm and previously
measuring 6.6 x 6.0 cm. Limited evaluation for endoleak due to the
lack of precontrast images through the abdominal aorta and the lack
of delayed images. However, there is contrast in the IMA up to the
origin and patient could be at risk for a type 2 endoleak.

Celiac: Patent without evidence of aneurysm, dissection, vasculitis
or significant stenosis.

SMA: SMA is patent with mild narrowing in the proximal aspect. No
evidence for dissection or aneurysm.

Renals: Single right renal artery with irregularity along the
proximal aspect. Findings compatible with mild-to-moderate stenosis
approximately 1 cm from the right renal artery origin. There are at
least 2 left renal arteries which are patent. No evidence for
dissection or aneurysm involving the left renal arteries.

IMA: There is contrast within the IMA including near the origin. IMA
could be associated with a type 2 endoleak but difficult to
characterize on these images.

Inflow: Thrombus within the right graft limb as described in the
aorta section. Stent graft terminates in the right common iliac
artery. There is thrombus along the distal aspect of the right limb
graft and thrombus at the right iliac artery bifurcation thrombus
extending into the right hypogastric artery. Right external iliac
artery is patent. There appears to be a small amount of thrombus in
the proximal right SFA.

Left limb graft is patent and terminates in the left common iliac
artery. No significant thrombus or stenosis in the left limb graft.
The left hypogastric artery is patent. Left external iliac artery is
patent. Proximal left femoral arteries are patent.

Veins: Limited evaluation on this arterial phase of contrast.

Review of the MIP images confirms the above findings.

NON-VASCULAR

Hepatobiliary: No gross abnormality to the liver. High-density
material in the gallbladder.

Pancreas: No gross abnormality to the pancreas.

Spleen: Limited evaluation on this arterial phase of contrast. A
small amount of fluid or stranding along the lateral aspect of the
spleen which is similar to the prior examination.

Adrenals/Urinary Tract: Again noted is a hyperdense nodule involving
the medial left adrenal gland that measures roughly 1.4 cm. Right
adrenal gland appears to be unremarkable. There is a cyst in the
right kidney upper pole. Areas of cortical scarring and thinning in
the right kidney. Mild fullness of the right renal collecting
system. Urinary bladder is decompressed with a Foley catheter.
Probable small cyst in the left kidney. Mild fullness in the left
renal collecting system and proximal left ureter.

Stomach/Bowel: Oral contrast within the colon. No acute
abnormalities in stomach, small bowel or large bowel. Appendix has
oral contrast without acute inflammatory changes.

Lymphatic: Limited evaluation for lymphadenopathy due to the
retroperitoneal blood and edema. There does not appear to be
significant lymph node enlargement.

Reproductive: Prostate is unremarkable.

Other: Again noted is a large amount of blood in the left
retroperitoneum, lateral and adjacent to the left iliopsoas muscle.
The hematoma lateral to the left psoas muscle on sequence 8, image
205 measures 7.0 x 6.0 cm and previously measured 8.9 x 5.6 cm on
the recent comparison examination. Hematoma adjacent to the left
iliopsoas muscle in the left hemipelvis roughly measures 7.8 x
cm on sequence 8, image 247 and measured 8.5 x 7.6 cm on the recent
comparison examination. Patient now has diffuse subcutaneous edema.
There is gas in left groin related to recent surgery. Increased
edema in the presacral region. Gas along the lower right anterior
abdominal wall compatible with recent surgery.

Musculoskeletal: No acute abnormality.

Review of the MIP images confirms the above findings.
IMPRESSION: 1. Left retroperitoneal hematoma has decreased in size and no
evidence for new or increased retroperitoneal bleeding.

2. Endovascular repair of the abdominal aortic aneurysm. Gas within
the abdominal aortic sac probably represents postoperative changes.
to characterize on this examination and recommend attention on
follow up imaging.

3. Thrombus in the right stent graft limb causing 60-70% narrowing
of the lumen. Thrombus in the distal common iliac artery and at the
iliac artery bifurcation.

4. Severe atherosclerotic disease involving the thoracic aorta.
Thrombus within the right graft limb could be related to the recent
surgery but patient is also at risk for embolic disease from the
thoracic aorta. Right renal artery stenosis.

5. Aneurysm of the ascending thoracic aorta measuring at least
cm. No evidence for an thoracic aortic dissection. Recommend annual
imaging followup by CTA or MRA. This recommendation follows 1919
ACCF/AHA/AATS/ACR/ASA/SCA/BHEBHE/KA FUNG/MAREK/LUPITHA Guidelines for the
Diagnosis and Management of Patients with Thoracic Aortic Disease.
Circulation. 1919; 121: e266-e369

6. Small bilateral effusions with diffuse bilateral lung densities.
Findings are suggestive for pulmonary edema but cannot exclude
infection.

7. Indeterminate left adrenal nodule. Recommend attention on follow
up imaging.

These results were called by telephone at the time of interpretation
on 07/03/2017 at [DATE] to Dr. ALF OVE MAJOU , who verbally
acknowledged these results.

## 2019-04-04 IMAGING — DX DG CHEST 1V PORT
1 series · 1 of 1 positions shown · non-contrast
Comparison: 07/02/2017

CLINICAL DATA: Respiratory failure

EXAM:
PORTABLE CHEST 1 VIEW

[chest ap]
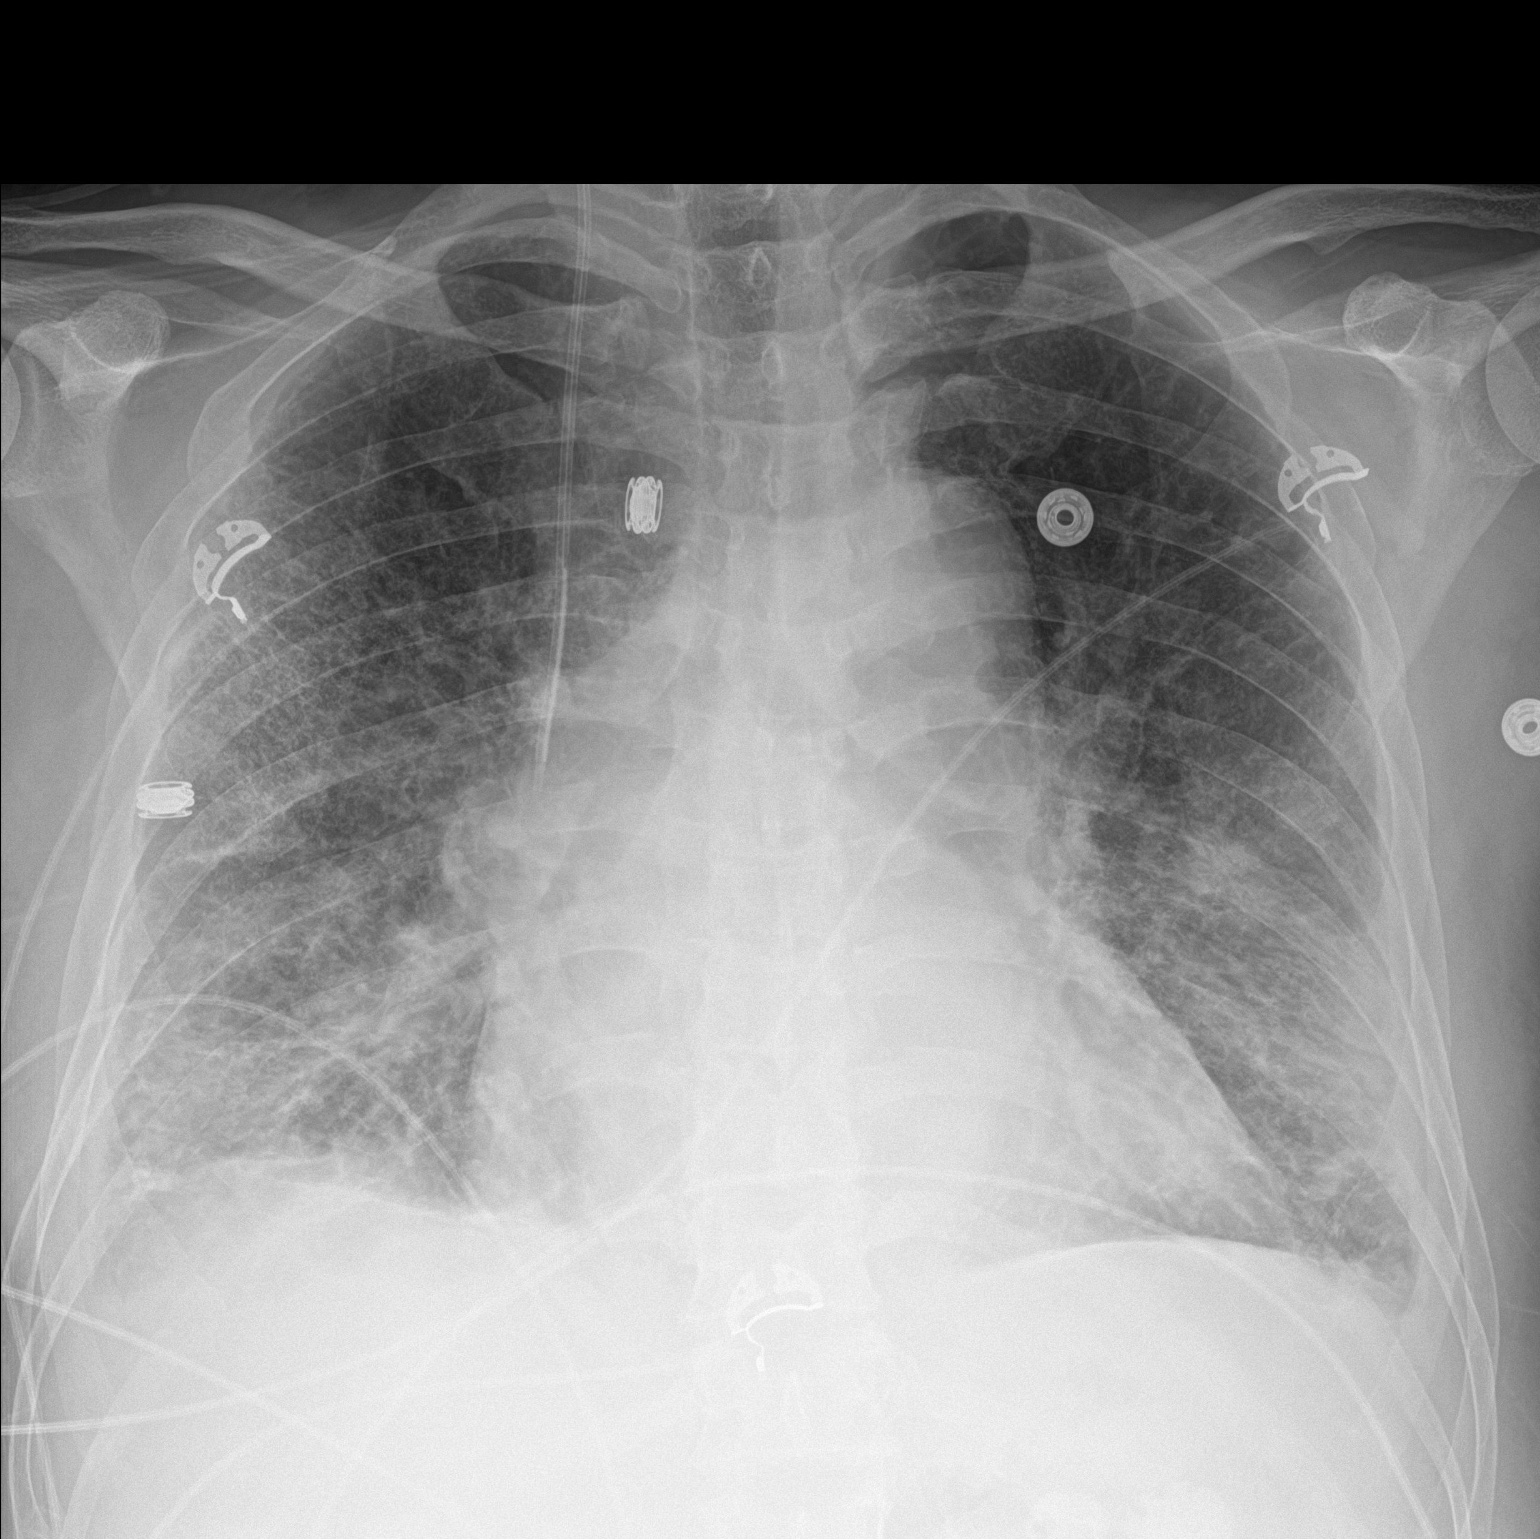

[1 of 1 positions shown; findings below may reference images not displayed]

FINDINGS: Endotracheal tube is been removed in the interval. Right jugular
central line is again seen and stable. Chronic fibrotic changes are
again identified in both lungs. Improved aeration is noted in the
bases bilaterally when compared with the prior exam. No bony
abnormality is noted.
IMPRESSION: Improved aeration in the bases bilaterally. Some chronic fibrotic
changes are again identified and stable. No new focal abnormality is
seen.

## 2019-04-08 ENCOUNTER — Other Ambulatory Visit: Payer: Self-pay | Admitting: Thoracic Surgery (Cardiothoracic Vascular Surgery)

## 2019-04-08 DIAGNOSIS — I712 Thoracic aortic aneurysm, without rupture, unspecified: Secondary | ICD-10-CM

## 2019-04-15 ENCOUNTER — Ambulatory Visit: Payer: PPO | Admitting: Thoracic Surgery (Cardiothoracic Vascular Surgery)

## 2019-05-15 ENCOUNTER — Other Ambulatory Visit: Payer: PPO

## 2019-05-20 ENCOUNTER — Ambulatory Visit: Payer: PPO | Admitting: Thoracic Surgery (Cardiothoracic Vascular Surgery)

## 2019-05-27 ENCOUNTER — Ambulatory Visit: Payer: PPO | Admitting: Thoracic Surgery (Cardiothoracic Vascular Surgery)

## 2019-05-29 ENCOUNTER — Other Ambulatory Visit: Payer: Self-pay

## 2019-05-29 ENCOUNTER — Ambulatory Visit
Admission: RE | Admit: 2019-05-29 | Discharge: 2019-05-29 | Disposition: A | Discharge status: Home / Self Care | Payer: PPO | Source: Ambulatory Visit | Attending: Thoracic Surgery (Cardiothoracic Vascular Surgery) | Admitting: Thoracic Surgery (Cardiothoracic Vascular Surgery)

## 2019-05-29 DIAGNOSIS — I712 Thoracic aortic aneurysm, without rupture, unspecified: Secondary | ICD-10-CM

## 2019-05-29 MED ORDER — IOPAMIDOL (ISOVUE-370) INJECTION 76%
50.0000 mL | Freq: Once | INTRAVENOUS | Status: AC | PRN
  Administered 2019-05-29: 50 mL via INTRAVENOUS

## 2019-06-10 ENCOUNTER — Ambulatory Visit (INDEPENDENT_AMBULATORY_CARE_PROVIDER_SITE_OTHER): Payer: PPO | Admitting: Thoracic Surgery (Cardiothoracic Vascular Surgery)

## 2019-06-10 ENCOUNTER — Other Ambulatory Visit: Payer: Self-pay

## 2019-06-10 ENCOUNTER — Encounter: Payer: Self-pay | Admitting: Thoracic Surgery (Cardiothoracic Vascular Surgery)

## 2019-06-10 VITALS — BP 126/53 | HR 74 | Temp 97.7°F | Resp 20 | Ht 67.0 in | Wt 151.6 lb

## 2019-06-10 DIAGNOSIS — I700 Atherosclerosis of aorta: Secondary | ICD-10-CM

## 2019-06-10 DIAGNOSIS — I712 Thoracic aortic aneurysm, without rupture: Secondary | ICD-10-CM | POA: Diagnosis not present

## 2019-06-10 DIAGNOSIS — I7121 Aneurysm of the ascending aorta, without rupture: Secondary | ICD-10-CM

## 2019-06-10 MED ORDER — ATORVASTATIN CALCIUM 20 MG PO TABS: 20.0000 mg | ORAL_TABLET | Freq: Every day | ORAL | 1 refills | Status: DC

## 2019-06-10 NOTE — Progress Notes (Signed)
[address]       [place] [address]             [phone]     HPI: [name] returns for follow up of his ascending aneurysm  Thomas [name] is a 63 year old millimeters gentleman with a history of thoracic aortic atherosclerosis, ascending aortic aneurysm, multiple cerebellar strokes, peripheral neuropathy, remote tobacco abuse, pulmonary fibrosis, stage III chronic kidney disease, and a history of a ruptured abdominal aneurysm.  In April 2019 he was hospitalized with pneumococcal sepsis.  He also had a ruptured 6.6 cm abdominal aortic aneurysm requiring emergent repair.  In addition an MRI of the brain showed multiple cerebellar strokes.  In May 2019 he had recurrent cerebellar infarcts.  Echocardiogram showed moderate aortic insufficiency but no vegetations.  CT of the chest showed reported 4.5 cm ascending aneurysm with soft plaque in the arch and proximal descending aorta.  He has been followed every 70months and the aneurysm has remained stable.  In the interim since his last visit he says the dizziness is better but he just feels weak overall.  He continues to have a burning sensation in his feet from his peripheral neuropathy.  He is not having any peripheral edema.  He complains of nasal congestion and cough which causes his chest to hurt.  He is not having any exertional chest discomfort.  Past Medical History:  Diagnosis Date  . Chronic kidney disease   . Hyperlipidemia   . Hypertension   . Occlusion of right middle cerebral artery   . PVD (peripheral vascular disease) ([place])   . Ruptured abdominal aortic aneurysm (AAA) ([place])   . Stroke ([place]) 06/2017  . Thoracic aortic aneurysm (TAA) ([place])      Current Outpatient Medications  Medication Sig Dispense Refill  . amLODipine (NORVASC) 5 MG tablet Take 1 tablet (5 mg total) by mouth daily. 90 tablet 3  . aspirin EC 81 MG tablet Take 1 tablet (81 mg total) by mouth daily. 150 tablet 2  . losartan (COZAAR) 50 MG  tablet Take 1 tablet (50 mg total) by mouth daily. 90 tablet 3  . omeprazole (PRILOSEC) 20 MG capsule Take 1 capsule (20 mg total) by mouth daily. 30 capsule 2  . atorvastatin (LIPITOR) 20 MG tablet Take 1 tablet (20 mg total) by mouth daily at 6 PM. 90 tablet 1   No current facility-administered medications for this visit.    Physical Exam BP (!) 126/53 (BP Location: Right Arm, Patient Position: Sitting, Cuff Size: Normal)   Pulse 74   Temp 97.7 F (36.5 C) (Temporal)   Resp 20   Ht 5\' 7"  (1.702 m)   Wt 151 lb 9.6 oz (68.8 kg)   SpO2 96% Comment: RA  BMI 23.67 kg/m  64 year old man in no acute distress Alert and oriented x3 with no focal deficits Lungs clear with equal breath sounds bilaterally Cardiac regular rate and rhythm with no audible murmur No peripheral edema  Diagnostic Tests: CT ANGIOGRAPHY CHEST WITH CONTRAST  TECHNIQUE: Multidetector CT imaging of the chest was performed using the standard protocol during bolus administration of intravenous contrast. Multiplanar CT image reconstructions and MIPs were obtained to evaluate the vascular anatomy.  CONTRAST:  20mL ISOVUE-370 IOPAMIDOL (ISOVUE-370) INJECTION 76%  COMPARISON:  CT scan 10/24/2018  FINDINGS: Cardiovascular: The heart is normal in size and stable. No pericardial effusion.  There is advanced atherosclerotic disease involving the thoracic aorta. No aortic dissection. Stable fusiform aneurysmal  dilatation of the ascending thoracic aorta with maximum measurement of 4.2 cm at the level the right pulmonary artery. This is unchanged. The aortic branch vessels are patent. Stable coronary artery calcifications. The descending thoracic aorta is tortuous but no aneurysm or dissection.  Mediastinum/Nodes: Stable scattered mediastinal and hilar lymph nodes are stable. The largest node is in the right paratracheal area on image 42/for and measures 12 mm.  The esophagus is grossly  normal.  Lungs/Pleura: Stable chronic interstitial lung disease and emphysema. No acute overlying pulmonary findings or worrisome pulmonary lesions. No pleural effusions. A few small stable scattered calcified granulomas are noted.  Upper Abdomen: Stable large calcified gallstones in the gallbladder. Stable atrophy and scarring changes involving the right kidney. Stable chronic subcapsular fluid collection involving the spleen. Aortoiliac stent graft again noted.  Musculoskeletal: No chest wall mass, supraclavicular or axillary adenopathy. Small scattered lymph nodes are stable. The thyroid gland appears normal. The bony thorax is intact.  Review of the MIP images confirms the above findings.  IMPRESSION: 1. Stable ascending thoracic aortic aneurysm measuring a maximum of 4.2 cm. Advanced atherosclerotic disease but no dissection. Recommend continue annual screening with CTA. 2. Stable significant chronic lung disease. No acute overlying pulmonary process or worrisome pulmonary lesions. 3. Stable mediastinal and hilar lymph nodes. 4. Stable upper abdominal findings as above.  Aortic Atherosclerosis (ICD10-I70.0) and Emphysema (ICD10-J43.9).   Electronically Signed   By: Marijo Sanes M.D.   On: 05/29/2019 11:27 I personally reviewed the CT images and concur with the findings noted above  Impression: Thomas [name] a 64 year old vitamins gentleman with a history of an ascending aortic aneurysm, thoracic aortic atherosclerosis, ruptured abdominal aneurysm, pneumococcal sepsis, cerebellar strokes, peripheral neuropathy, hypertension and stage III chronic kidney disease.   Thoracic aortic atherosclerosis-he says has not been taking his Lipitor because his prescription ran out.  I went ahead and represcribed that medication for him today.  Ascending aneurysm-stable at about 4.4 cm.  We will plan to continue with semiannual follow-up.  Hypertension-blood pressure well  controlled although diastolic is a little low.  Continue current regimen.  Nasal congestion-recommended he try a nonsedating antihistamine such as Claritin or Zyrtec.  Plan: Renew Lipitor Return in 6 months with CT chest.  We will plan to do without contrast due to CKD.  Melrose Nakayama, MD [facility] [phone]

## 2019-07-16 ENCOUNTER — Telehealth: Payer: Self-pay

## 2019-07-16 ENCOUNTER — Ambulatory Visit: Payer: PPO | Admitting: Neurology

## 2019-07-16 ENCOUNTER — Encounter: Payer: Self-pay | Admitting: Neurology

## 2019-07-16 NOTE — Telephone Encounter (Signed)
Patient no show for appt today. 

## 2019-07-18 ENCOUNTER — Ambulatory Visit: Payer: PPO | Admitting: Nurse Practitioner

## 2019-07-23 ENCOUNTER — Encounter: Payer: Self-pay | Admitting: Nurse Practitioner

## 2019-07-23 ENCOUNTER — Ambulatory Visit: Payer: PPO | Attending: Nurse Practitioner | Admitting: Nurse Practitioner

## 2019-07-23 ENCOUNTER — Other Ambulatory Visit: Payer: Self-pay | Admitting: Internal Medicine

## 2019-07-23 ENCOUNTER — Other Ambulatory Visit: Payer: Self-pay

## 2019-07-23 DIAGNOSIS — N181 Chronic kidney disease, stage 1: Secondary | ICD-10-CM

## 2019-07-23 DIAGNOSIS — I639 Cerebral infarction, unspecified: Secondary | ICD-10-CM

## 2019-07-23 DIAGNOSIS — I10 Essential (primary) hypertension: Secondary | ICD-10-CM | POA: Diagnosis not present

## 2019-07-23 DIAGNOSIS — E785 Hyperlipidemia, unspecified: Secondary | ICD-10-CM | POA: Diagnosis not present

## 2019-07-23 DIAGNOSIS — K219 Gastro-esophageal reflux disease without esophagitis: Secondary | ICD-10-CM | POA: Diagnosis not present

## 2019-07-23 MED ORDER — AMLODIPINE BESYLATE 5 MG PO TABS: 5.0000 mg | ORAL_TABLET | Freq: Every day | ORAL | 3 refills | Status: DC

## 2019-07-23 MED ORDER — OMEPRAZOLE 20 MG PO CPDR: 20.0000 mg | DELAYED_RELEASE_CAPSULE | Freq: Every day | ORAL | 1 refills | Status: DC

## 2019-07-23 NOTE — Progress Notes (Signed)
Virtual Visit via Telephone Note Due to national recommendations of social distancing due to Thomas [name], telehealth visit is felt to be most appropriate for this patient at this time.  I discussed the limitations, risks, security and privacy concerns of performing an evaluation and management service by telephone and the availability of in person appointments. I also discussed with the patient that there may be a patient responsible charge related to this service. The patient expressed understanding and agreed to proceed.    I connected with Thomas [name] on 07/23/19  at   8:30 AM EDT  EDT by telephone and verified that I am speaking with the correct person using two identifiers.   Consent I discussed the limitations, risks, security and privacy concerns of performing an evaluation and management service by telephone and the availability of in person appointments. I also discussed with the patient that there may be a patient responsible charge related to this service. The patient expressed understanding and agreed to proceed.   Location of Patient: Private Residence   Location of Provider: [place] [facility] [employer] Office    Persons participating in Telemedicine visit: Geryl Rankins FNP-BC [place]    History of Present Illness: Telemedicine visit for: F/U PMH: Ruptured AAA (required emergent repair), Stage III CKD, recurrent cerebellar infarcts, pulmonary fibrosis, peripheral neuropathy, TAA.   Vertigo minimal but increases with activity/motion. Missed most recent Neurology office visit. States he was not aware that he had an appt. Scheduled. He will have his daughter reschedule as soon as possible.    Essential Hypertension Does not monitor his blood pressure at home although he has a few blood pressure devices at home. I have requested that he spot check his blood pressure at least 1-2 times per week. Currently taking amlodipine 5mg  and losartan 50 mg daily.  BP  Readings from Last 3 Encounters:  06/10/19 (!) 126/53  01/15/19 110/71  12/25/18 117/65   Dyslipidemia States he had not been taking atorvastatin 20 mg daily for several months. Apparently it had fallen off of his medication profile after being discontinued by another provider. It was restarted in March 2021 during a visit with CTS. He denies any statin intolerance.  Lab Results  Component Value Date   CHOL 165 12/20/2018   CHOL 212 (H) 06/23/2018   CHOL 126 11/08/2017   Lab Results  Component Value Date   HDL 48 12/20/2018   HDL 45 06/23/2018   HDL 41 11/08/2017   Lab Results  Component Value Date   LDLCALC 100 (H) 12/20/2018   LDLCALC 144 (H) 06/23/2018   LDLCALC 64 11/08/2017   Lab Results  Component Value Date   TRIG 92 12/20/2018   TRIG 116 06/23/2018   TRIG 106 11/08/2017   Lab Results  Component Value Date   CHOLHDL 3.4 12/20/2018   CHOLHDL 4.7 06/23/2018   CHOLHDL 3.1 11/08/2017     Past Medical History:  Diagnosis Date  . Chronic kidney disease   . Hyperlipidemia   . Hypertension   . Occlusion of right middle cerebral artery   . PVD (peripheral vascular disease) ([place])   . Ruptured abdominal aortic aneurysm (AAA) ([place])   . Stroke ([place]) 06/2017  . Thoracic aortic aneurysm (TAA) ([place])     Past Surgical History:  Procedure Laterality Date  . COLONOSCOPY  2010   Dr.Hung  . ENDOVASCULAR STENT INSERTION N/A 07/01/2017   Procedure: ENDOVASCULAR STENT GRAFT INSERTION;  Surgeon: Serafina Mitchell, MD;  Location: [facility]  OR;  Service: Vascular;  Laterality: N/A;  . THROMBECTOMY FEMORAL ARTERY Bilateral 07/01/2017   Procedure: Bilateral IlioFemoral Thrombectomies;  Surgeon: Serafina Mitchell, MD;  Location: [facility] OR;  Service: Vascular;  Laterality: Bilateral;    Family History  Family history unknown: Yes    Social History   Socioeconomic History  . Marital status: Divorced    Spouse name: Not on file  . Number of children: Not on file  . Years of education: Not on  file  . Highest education level: Not on file  Occupational History  . Not on file  Tobacco Use  . Smoking status: Former Smoker    Types: Cigarettes    Quit date: 07/16/2017    Years since quitting: 2.0  . Smokeless tobacco: Never Used  Substance and Sexual Activity  . Alcohol use: No  . Drug use: No  . Sexual activity: Not Currently  Other Topics Concern  . Not on file  Social History Narrative  . Not on file   Social Determinants of Health   Financial Resource Strain:   . Difficulty of Paying Living Expenses:   Food Insecurity:   . Worried About Charity fundraiser in the Last Year:   . Arboriculturist in the Last Year:   Transportation Needs:   . Film/video editor (Medical):   Marland Kitchen Lack of Transportation (Non-Medical):   Physical Activity:   . Days of Exercise per Week:   . Minutes of Exercise per Session:   Stress:   . Feeling of Stress :   Social Connections:   . Frequency of Communication with Friends and Family:   . Frequency of Social Gatherings with Friends and Family:   . Attends Religious Services:   . Active Member of Clubs or Organizations:   . Attends Archivist Meetings:   Marland Kitchen Marital Status:      Observations/Objective: Awake, alert and oriented x 3   Review of Systems  Constitutional: Negative for fever, malaise/fatigue and weight loss.  HENT: Negative.  Negative for nosebleeds.   Eyes: Negative.  Negative for blurred vision, double vision and photophobia.  Respiratory: Negative.  Negative for cough and shortness of breath.   Cardiovascular: Negative.  Negative for chest pain, palpitations and leg swelling.  Gastrointestinal: Negative.  Negative for heartburn, nausea and vomiting.  Musculoskeletal: Negative.  Negative for myalgias.  Neurological: Positive for dizziness. Negative for focal weakness, seizures and headaches.  Psychiatric/Behavioral: Negative.  Negative for suicidal ideas.    Assessment and Plan: Thomas [name] was seen today  for medication refill.  Diagnoses and all orders for this visit:  Essential hypertension -     amLODipine (NORVASC) 5 MG tablet; Take 1 tablet (5 mg total) by mouth daily. Continue all antihypertensives as prescribed.  Remember to bring in your blood pressure log with you for your follow up appointment.  DASH/Mediterranean Diets are healthier choices for HTN.    GERD without esophagitis -     omeprazole (PRILOSEC) 20 MG capsule; Take 1 capsule (20 mg total) by mouth daily. INSTRUCTIONS: Avoid GERD Triggers: acidic, spicy or fried foods, caffeine, coffee, sodas,  alcohol and chocolate.   Acute ischemic stroke (HCC) Atorvastatin 20mg  daily ASA 81mg      Follow Up Instructions Return in about 3 months (around 10/22/2019).     I discussed the assessment and treatment plan with the patient. The patient was provided an opportunity to ask questions and all were answered. The patient agreed with the plan  and demonstrated an understanding of the instructions.   The patient was advised to call back or seek an in-person evaluation if the symptoms worsen or if the condition fails to improve as anticipated.  I provided 14 minutes of non-face-to-face time during this encounter including median intraservice time, reviewing previous notes, labs, imaging, medications and explaining diagnosis and management.  Gildardo Pounds, FNP-BC

## 2019-07-25 ENCOUNTER — Other Ambulatory Visit: Payer: Self-pay | Admitting: Internal Medicine

## 2019-07-25 DIAGNOSIS — I10 Essential (primary) hypertension: Secondary | ICD-10-CM

## 2019-10-22 ENCOUNTER — Encounter: Payer: Self-pay | Admitting: Nurse Practitioner

## 2019-10-22 ENCOUNTER — Other Ambulatory Visit: Payer: Self-pay

## 2019-10-22 ENCOUNTER — Ambulatory Visit: Payer: PPO | Attending: Nurse Practitioner | Admitting: Nurse Practitioner

## 2019-10-22 DIAGNOSIS — I639 Cerebral infarction, unspecified: Secondary | ICD-10-CM

## 2019-10-22 DIAGNOSIS — I10 Essential (primary) hypertension: Secondary | ICD-10-CM

## 2019-10-22 DIAGNOSIS — E785 Hyperlipidemia, unspecified: Secondary | ICD-10-CM

## 2019-10-22 DIAGNOSIS — N1831 Chronic kidney disease, stage 3a: Secondary | ICD-10-CM | POA: Diagnosis not present

## 2019-10-22 DIAGNOSIS — K219 Gastro-esophageal reflux disease without esophagitis: Secondary | ICD-10-CM

## 2019-10-22 DIAGNOSIS — N181 Chronic kidney disease, stage 1: Secondary | ICD-10-CM | POA: Diagnosis not present

## 2019-10-22 NOTE — Progress Notes (Signed)
Assessment & Plan:  [name] was seen today for follow-up.  Diagnoses and all orders for this visit:  Essential hypertension -     CMP14+EGFR Continue all antihypertensives as prescribed.  Remember to bring in your blood pressure log with you for your follow up appointment.  DASH/Mediterranean Diets are healthier choices for HTN.   Dyslipidemia -     Lipid panel -     Hemoglobin A1c INSTRUCTIONS: Work on a low fat, heart healthy diet and participate in regular aerobic exercise program by working out at least 150 minutes per week; 5 days a week-30 minutes per day. Avoid red meat/beef/steak,  fried foods. junk foods, sodas, sugary drinks, unhealthy snacking, alcohol and smoking.  Drink at least 80 oz of water per day and monitor your carbohydrate intake daily.    Stage 3a chronic kidney disease -     CMP14+EGFR -     CBC -     Hemoglobin A1c  GERD without esophagitis -     CBC INSTRUCTIONS: Avoid GERD Triggers: acidic, spicy or fried foods, caffeine, coffee, sodas,  alcohol and chocolate.   Acute ischemic stroke ([place]) -     CBC    Patient has been counseled on age-appropriate routine health concerns for screening and prevention. These are reviewed and up-to-date. Referrals have been placed accordingly. Immunizations are up-to-date or declined.    Subjective:   Chief Complaint  Patient presents with  . Follow-up    Pt. is here for 3 months F.U on hypertension.    HPI Thomas [name] 64 y.o. male presents to office today for f/u to HTN. He has a history of TAA, AAA, recurrent CVAs (cerebellar) peripheral neuropathy, CKD stage 3, Pulmonary fibrosis.  Accompanied by his daughter today. Doing well aside from intermittent mild dizziness. Denies any falls. I did recommend he increase his fluid intake. Only drinking maybe 1 bottle of water per day and an ensure. Reports decreased appetite.   Essential Hypertension Well controlled. Needs to improve hydration. SBP slightly on the lower  side. Currently taking amlodipine 5 mg daily and losartan 50 mg daily. May need to stop amlodipine if BP continues lower. This could be contributing to his intermittent dizziness. Denies chest pain, shortness of breath, palpitations, lightheadedness, dizziness, headaches or BLE edema. He does endorse RLE calf pain with walking. Goes away with rest. Will check ABI as he does have a history of PVD and has seen vascular in the past for this however I am unable to locate a previous ABI performed in our office.  BP Readings from Last 3 Encounters:  10/22/19 112/71  06/10/19 (!) 126/53  01/15/19 110/71    Review of Systems  Constitutional: Negative for fever, malaise/fatigue and weight loss.  HENT: Negative.  Negative for nosebleeds.   Eyes: Negative.  Negative for blurred vision, double vision and photophobia.  Respiratory: Negative.  Negative for cough and shortness of breath.   Cardiovascular: Negative for chest pain, palpitations and leg swelling.       RLE pain  Gastrointestinal: Negative.  Negative for heartburn, nausea and vomiting.  Musculoskeletal: Negative.  Negative for myalgias.  Neurological: Positive for dizziness. Negative for focal weakness, seizures and headaches.  Psychiatric/Behavioral: Negative.  Negative for suicidal ideas.    Past Medical History:  Diagnosis Date  . Chronic kidney disease   . Hyperlipidemia   . Hypertension   . Occlusion of right middle cerebral artery   . PVD (peripheral vascular disease) ([place])   . Ruptured abdominal  aortic aneurysm (AAA) ([place])   . Stroke ([place]) 06/2017  . Thoracic aortic aneurysm (TAA) ([place])     Past Surgical History:  Procedure Laterality Date  . COLONOSCOPY  2010   Dr.Hung  . ENDOVASCULAR STENT INSERTION N/A 07/01/2017   Procedure: ENDOVASCULAR STENT GRAFT INSERTION;  Surgeon: Serafina Mitchell, MD;  Location: [facility];  Service: Vascular;  Laterality: N/A;  . THROMBECTOMY FEMORAL ARTERY Bilateral 07/01/2017   Procedure: Bilateral  IlioFemoral Thrombectomies;  Surgeon: Serafina Mitchell, MD;  Location: [facility] OR;  Service: Vascular;  Laterality: Bilateral;    Family History  Family history unknown: Yes    Social History Reviewed with no changes to be made today.   Outpatient Medications Prior to Visit  Medication Sig Dispense Refill  . amLODipine (NORVASC) 5 MG tablet Take 1 tablet (5 mg total) by mouth daily. 90 tablet 3  . aspirin EC 81 MG tablet Take 1 tablet (81 mg total) by mouth daily. 150 tablet 2  . atorvastatin (LIPITOR) 20 MG tablet Take 1 tablet (20 mg total) by mouth daily at 6 PM. 90 tablet 1  . losartan (COZAAR) 50 MG tablet TAKE 1 TABLET BY MOUTH ONCE DAILY 90 tablet 3  . omeprazole (PRILOSEC) 20 MG capsule Take 1 capsule (20 mg total) by mouth daily. 90 capsule 1   No facility-administered medications prior to visit.    No Known Allergies     Objective:    BP 112/71 (BP Location: Right Arm, Patient Position: Sitting, Cuff Size: Normal)   Pulse 76   Temp 97.7 F (36.5 C) (Temporal)   Ht 5' 7" (1.702 m)   Wt 146 lb 9.6 oz (66.5 kg)   SpO2 97%   BMI 22.96 kg/m  Wt Readings from Last 3 Encounters:  10/22/19 146 lb 9.6 oz (66.5 kg)  06/10/19 151 lb 9.6 oz (68.8 kg)  01/15/19 148 lb 3.2 oz (67.2 kg)    Physical Exam Vitals and nursing note reviewed.  Constitutional:      Appearance: He is well-developed.  HENT:     Head: Normocephalic and atraumatic.  Cardiovascular:     Rate and Rhythm: Normal rate and regular rhythm.     Pulses:          Dorsalis pedis pulses are 1+ on the right side and 1+ on the left side.     Heart sounds: Normal heart sounds. No murmur heard.  No friction rub. No gallop.   Pulmonary:     Effort: Pulmonary effort is normal. No tachypnea or respiratory distress.     Breath sounds: Normal breath sounds. No decreased breath sounds, wheezing, rhonchi or rales.  Chest:     Chest wall: No tenderness.  Abdominal:     General: Bowel sounds are normal.     Palpations:  Abdomen is soft.  Musculoskeletal:        General: Normal range of motion.     Cervical back: Normal range of motion.  Skin:    General: Skin is warm and dry.  Neurological:     Mental Status: He is alert and oriented to person, place, and time.     Coordination: Coordination normal.  Psychiatric:        Behavior: Behavior normal. Behavior is cooperative.        Thought Content: Thought content normal.        Judgment: Judgment normal.          Patient has been counseled extensively about nutrition and exercise  as well as the importance of adherence with medications and regular follow-up. The patient was given clear instructions to go to ER or return to medical center if symptoms don't improve, worsen or new problems develop. The patient verbalized understanding.   Follow-up: Return in about 3 months (around 01/22/2020).   Gildardo Pounds, FNP-BC [facility] and [place], [place]   10/22/2019, 1:35 PM

## 2019-10-23 LAB — CMP14+EGFR
ALT: 19 IU/L (ref 0–44)
AST: 18 IU/L (ref 0–40)
Albumin/Globulin Ratio: 1.1 — ABNORMAL LOW (ref 1.2–2.2)
Albumin: 4.2 g/dL (ref 3.8–4.8)
Alkaline Phosphatase: 132 IU/L — ABNORMAL HIGH (ref 48–121)
BUN/Creatinine Ratio: 10 (ref 10–24)
BUN: 16 mg/dL (ref 8–27)
Bilirubin Total: 0.4 mg/dL (ref 0.0–1.2)
CO2: 22 mmol/L (ref 20–29)
Calcium: 9.5 mg/dL (ref 8.6–10.2)
Chloride: 105 mmol/L (ref 96–106)
Creatinine, Ser: 1.59 mg/dL — ABNORMAL HIGH (ref 0.76–1.27)
GFR calc Af Amer: 52 mL/min/{1.73_m2} — ABNORMAL LOW (ref >59)
GFR calc non Af Amer: 45 mL/min/{1.73_m2} — ABNORMAL LOW (ref >59)
Globulin, Total: 3.7 g/dL (ref 1.5–4.5)
Glucose: 76 mg/dL (ref 65–99)
Potassium: 4.5 mmol/L (ref 3.5–5.2)
Sodium: 141 mmol/L (ref 134–144)
Total Protein: 7.9 g/dL (ref 6.0–8.5)

## 2019-10-23 LAB — LIPID PANEL
Chol/HDL Ratio: 3 ratio (ref 0.0–5.0)
Cholesterol, Total: 143 mg/dL (ref 100–199)
HDL: 48 mg/dL (ref >39)
LDL Chol Calc (NIH): 78 mg/dL (ref 0–99)
Triglycerides: 91 mg/dL (ref 0–149)
VLDL Cholesterol Cal: 17 mg/dL (ref 5–40)

## 2019-10-23 LAB — CBC
Hematocrit: 44.3 % (ref 37.5–51.0)
Hemoglobin: 14.4 g/dL (ref 13.0–17.7)
MCH: 25.7 pg — ABNORMAL LOW (ref 26.6–33.0)
MCHC: 32.5 g/dL (ref 31.5–35.7)
MCV: 79 fL (ref 79–97)
Platelets: 352 10*3/uL (ref 150–450)
RBC: 5.6 x10E6/uL (ref 4.14–5.80)
RDW: 15.4 % (ref 11.6–15.4)
WBC: 11.4 10*3/uL — ABNORMAL HIGH (ref 3.4–10.8)

## 2019-10-23 LAB — HEMOGLOBIN A1C
Est. average glucose Bld gHb Est-mCnc: 123 mg/dL
Hgb A1c MFr Bld: 5.9 % — ABNORMAL HIGH (ref 4.8–5.6)

## 2019-10-24 ENCOUNTER — Ambulatory Visit: Payer: PPO | Attending: Nurse Practitioner

## 2019-10-24 ENCOUNTER — Other Ambulatory Visit: Payer: Self-pay

## 2019-10-24 VITALS — BP 138/80 | HR 61 | Temp 97.6°F | Resp 16

## 2019-10-24 DIAGNOSIS — R6889 Other general symptoms and signs: Secondary | ICD-10-CM | POA: Diagnosis not present

## 2019-10-24 NOTE — Progress Notes (Signed)
Pt is here  for Ankle Brachial Pressure Index  measurements as instructed by te PCP. NP Zelda Fleming  ABI readings   L ABI 0.90 R ABI 0.63 BP 138/80 HR 61  Readings have been reported to [employer] . Vascular referral will be placed  Made pt aware.

## 2019-11-19 ENCOUNTER — Other Ambulatory Visit: Payer: Self-pay | Admitting: Nurse Practitioner

## 2019-11-19 ENCOUNTER — Ambulatory Visit: Payer: PPO | Attending: Nurse Practitioner

## 2019-11-19 ENCOUNTER — Other Ambulatory Visit: Payer: Self-pay | Admitting: *Deleted

## 2019-11-19 ENCOUNTER — Other Ambulatory Visit: Payer: Self-pay

## 2019-11-19 DIAGNOSIS — R7989 Other specified abnormal findings of blood chemistry: Secondary | ICD-10-CM | POA: Diagnosis not present

## 2019-11-19 DIAGNOSIS — R7401 Elevation of levels of liver transaminase levels: Secondary | ICD-10-CM

## 2019-11-19 DIAGNOSIS — I712 Thoracic aortic aneurysm, without rupture, unspecified: Secondary | ICD-10-CM

## 2019-11-19 NOTE — Progress Notes (Unsigned)
CT

## 2019-11-20 ENCOUNTER — Other Ambulatory Visit: Payer: Self-pay | Admitting: Nurse Practitioner

## 2019-11-20 DIAGNOSIS — D721 Eosinophilia, unspecified: Secondary | ICD-10-CM

## 2019-11-20 DIAGNOSIS — D72829 Elevated white blood cell count, unspecified: Secondary | ICD-10-CM

## 2019-11-20 DIAGNOSIS — D7219 Other eosinophilia: Secondary | ICD-10-CM

## 2019-11-20 LAB — CBC WITH DIFFERENTIAL/PLATELET
Basophils Absolute: 0.1 10*3/uL (ref 0.0–0.2)
Basos: 1 %
EOS (ABSOLUTE): 2 10*3/uL — ABNORMAL HIGH (ref 0.0–0.4)
Eos: 17 %
Hematocrit: 43.6 % (ref 37.5–51.0)
Hemoglobin: 14.3 g/dL (ref 13.0–17.7)
Immature Grans (Abs): 0 10*3/uL (ref 0.0–0.1)
Immature Granulocytes: 0 %
Lymphocytes Absolute: 3.6 10*3/uL — ABNORMAL HIGH (ref 0.7–3.1)
Lymphs: 31 %
MCH: 26.1 pg — ABNORMAL LOW (ref 26.6–33.0)
MCHC: 32.8 g/dL (ref 31.5–35.7)
MCV: 80 fL (ref 79–97)
Monocytes Absolute: 0.8 10*3/uL (ref 0.1–0.9)
Monocytes: 7 %
Neutrophils Absolute: 5.2 10*3/uL (ref 1.4–7.0)
Neutrophils: 44 %
Platelets: 327 10*3/uL (ref 150–450)
RBC: 5.47 x10E6/uL (ref 4.14–5.80)
RDW: 16 % — ABNORMAL HIGH (ref 11.6–15.4)
WBC: 11.8 10*3/uL — ABNORMAL HIGH (ref 3.4–10.8)

## 2019-11-20 LAB — HEPATIC FUNCTION PANEL
ALT: 12 IU/L (ref 0–44)
AST: 18 IU/L (ref 0–40)
Albumin: 4.4 g/dL (ref 3.8–4.8)
Alkaline Phosphatase: 132 IU/L — ABNORMAL HIGH (ref 48–121)
Bilirubin Total: 0.5 mg/dL (ref 0.0–1.2)
Bilirubin, Direct: 0.14 mg/dL (ref 0.00–0.40)
Total Protein: 8 g/dL (ref 6.0–8.5)

## 2019-12-09 ENCOUNTER — Ambulatory Visit
Admission: RE | Admit: 2019-12-09 | Discharge: 2019-12-09 | Disposition: A | Discharge status: Home / Self Care | Payer: PPO | Source: Ambulatory Visit | Attending: Thoracic Surgery (Cardiothoracic Vascular Surgery) | Admitting: Thoracic Surgery (Cardiothoracic Vascular Surgery)

## 2019-12-09 DIAGNOSIS — I708 Atherosclerosis of other arteries: Secondary | ICD-10-CM | POA: Diagnosis not present

## 2019-12-09 DIAGNOSIS — J849 Interstitial pulmonary disease, unspecified: Secondary | ICD-10-CM | POA: Diagnosis not present

## 2019-12-09 DIAGNOSIS — I712 Thoracic aortic aneurysm, without rupture, unspecified: Secondary | ICD-10-CM

## 2019-12-09 DIAGNOSIS — I700 Atherosclerosis of aorta: Secondary | ICD-10-CM | POA: Diagnosis not present

## 2019-12-09 DIAGNOSIS — I2510 Atherosclerotic heart disease of native coronary artery without angina pectoris: Secondary | ICD-10-CM | POA: Diagnosis not present

## 2019-12-16 ENCOUNTER — Other Ambulatory Visit: Payer: Self-pay

## 2019-12-16 ENCOUNTER — Encounter: Payer: Self-pay | Admitting: Thoracic Surgery (Cardiothoracic Vascular Surgery)

## 2019-12-16 ENCOUNTER — Ambulatory Visit (INDEPENDENT_AMBULATORY_CARE_PROVIDER_SITE_OTHER): Payer: PPO | Admitting: Thoracic Surgery (Cardiothoracic Vascular Surgery)

## 2019-12-16 VITALS — BP 106/72 | HR 80 | Temp 97.8°F | Resp 20 | Ht 67.0 in | Wt 147.0 lb

## 2019-12-16 DIAGNOSIS — I712 Thoracic aortic aneurysm, without rupture: Secondary | ICD-10-CM

## 2019-12-16 DIAGNOSIS — I7121 Aneurysm of the ascending aorta, without rupture: Secondary | ICD-10-CM

## 2019-12-16 NOTE — Progress Notes (Signed)
[address]       [place] [address]             [phone]     HPI: Thomas [name] returns for a scheduled follow-up  Thomas [name] is a 64 year old man with a history of atherosclerotic cardiovascular disease including thoracic aortic atherosclerosis, thoracic aortic aneurysm, right MCA occlusion, stroke, repair of ruptured abdominal aortic aneurysm, PAD, chronic kidney disease, hypertension, hyperlipidemia, tobacco abuse (quit 2019), and interstitial lung disease on CT.  In April 2019 he was hospitalized with pneumococcal sepsis.  He had a ruptured 6.6 cm abdominal aneurysm which required emergent repair.  In May 2019 he presented with recurrent cerebellar infarcts.  CT of the chest showed a reported 4.5 cm aneurysm and he has been followed every 6 months since then.  Of note the aneurysm is measuring smaller than that on subsequent scans most recently reported as 4.2 cm.  He complains of feeling weak overall.  He also has some burning in his feet which has been attributed to peripheral neuropathy.  He recently had some ABIs checked which showed a ABI of 0.63 on the right and 0.9 on the left.  He quit smoking after his hospitalization in 2019 and has not smoked since then. Past Medical History:  Diagnosis Date  . Chronic kidney disease   . Hyperlipidemia   . Hypertension   . Occlusion of right middle cerebral artery   . PVD (peripheral vascular disease) ([place])   . Ruptured abdominal aortic aneurysm (AAA) ([place])   . Stroke ([place]) 06/2017  . Thoracic aortic aneurysm (TAA) ([place])     Current Outpatient Medications  Medication Sig Dispense Refill  . amLODipine (NORVASC) 5 MG tablet Take 1 tablet (5 mg total) by mouth daily. 90 tablet 3  . aspirin EC 81 MG tablet Take 1 tablet (81 mg total) by mouth daily. 150 tablet 2  . atorvastatin (LIPITOR) 20 MG tablet Take 1 tablet (20 mg total) by mouth daily at 6 PM. 90 tablet 1  . losartan (COZAAR) 50 MG tablet TAKE 1 TABLET BY MOUTH  ONCE DAILY 90 tablet 3  . omeprazole (PRILOSEC) 20 MG capsule Take 1 capsule (20 mg total) by mouth daily. 90 capsule 1   No current facility-administered medications for this visit.    Physical Exam BP 106/72   Pulse 80   Temp 97.8 F (36.6 C) (Skin)   Resp 20   Ht 5\' 7"  (1.702 m)   Wt 147 lb (66.7 kg)   SpO2 95% Comment: RA  BMI 23.52 kg/m  64 year old man in no acute distress Alert and oriented x3, no focal weakness Lungs clear bilaterally Cardiac regular rate and rhythm with a faint diastolic murmur No peripheral edema No palpable DP or PT on the right  Diagnostic Tests: CT CHEST WITHOUT CONTRAST  TECHNIQUE: Multidetector CT imaging of the chest was performed following the standard protocol without IV contrast.  COMPARISON:  05/29/2019  FINDINGS: Cardiovascular: Measured in the same axial plane as comparison CT 05/29/2019, the ascending thoracic aorta measures 4.2 cm (image 73/3) compared to 4.2 cm.  Scattered atherosclerotic calcification of the aorta. Great vessels normal.  Coronary artery calcification and aortic atherosclerotic calcification. No axillary or supraclavicular adenopathy. No mediastinal or hilar adenopathy. No pericardial fluid. Esophagus normal.  Mediastinum/Nodes: No axillary or supraclavicular adenopathy. No mediastinal or hilar adenopathy. No pericardial fluid. Esophagus normal.  Lungs/Pleura:  Centrilobular emphysema the upper lobes. Mild bronchiectasis in lower lobes. Mild  peripheral subpleural reticulation. These chronic lung parenchymal findings are unchanged from comparison exam.  Upper Abdomen: Several faceted gallstones.  No cholecystitis.  Nodule of the LEFT adrenal gland measures 13 mm and unchanged over multiple comparison exams back to 07/01/2017. Indeterminate lesion  Musculoskeletal: No acute osseous abnormality.  IMPRESSION: 1. No change in diameter of ascending thoracic aortic aneurysm at 4.2 cm. 2.  Stable chronic interstitial lung disease. 3. Coronary artery calcification and Aortic Atherosclerosis (ICD10-I70.0). 4. Stable indeterminate LEFT adrenal nodule.   Electronically Signed   By: Suzy Bouchard M.D.   On: 12/09/2019 09:56 I personally reviewed the CT images and concur with the findings noted above  Impression: Thomas [name] is a 64 year old Asian gentleman with a history of atherosclerotic cardiovascular disease including thoracic aortic atherosclerosis, thoracic aortic aneurysm, right MCA occlusion, stroke, repair of ruptured abdominal aortic aneurysm, PAD, chronic kidney disease, hypertension, hyperlipidemia, tobacco abuse (quit 2019), and interstitial lung disease on CT.  Thoracic aortic atherosclerosis/ascending aortic aneurysm-stable at 4.2 cm.  No indication for intervention.  Blood pressure control is paramount.  At this point with multiple readings below 4.5 cm and being stable over time I think we can safely go to annual follow-up.  Hypertension-blood pressure well controlled currently.  PAD-has an ABI of 0.63 on the right and 0.9 on the left.  He was waiting for an appointment with vascular surgery.  He is a patient of Dr. Stephens Shire from a previous stent graft for ruptured AAA.  Will call over and arrange a follow-up appointment with him.  Plan: Return in 1 year with CT chest.  Will do without contrast  I spent 24 minutes in review of images, review of records, and in consultation with Mr. Cauthorn today. Melrose Nakayama, MD [facility] [phone]

## 2020-01-21 ENCOUNTER — Other Ambulatory Visit: Payer: Self-pay | Admitting: Thoracic Surgery (Cardiothoracic Vascular Surgery)

## 2020-01-21 ENCOUNTER — Other Ambulatory Visit: Payer: Self-pay | Admitting: Nurse Practitioner

## 2020-01-21 ENCOUNTER — Telehealth: Payer: Self-pay | Admitting: Nurse Practitioner

## 2020-01-21 DIAGNOSIS — K219 Gastro-esophageal reflux disease without esophagitis: Secondary | ICD-10-CM

## 2020-01-21 NOTE — Telephone Encounter (Signed)
Requested Prescriptions  Pending Prescriptions Disp Refills  . omeprazole (PRILOSEC) 20 MG capsule [Pharmacy Med Name: OMEPRAZOLE 20MG  CAPSULES] 90 capsule 1    Sig: TAKE 1 CAPSULE(20 MG) BY MOUTH DAILY     Gastroenterology: Proton Pump Inhibitors Passed - 01/21/2020  8:06 AM      Passed - Valid encounter within last 12 months    Recent Outpatient Visits          3 months ago Essential hypertension   [place], Vernia Buff, NP   6 months ago Essential hypertension   [place], Vernia Buff, NP   1 year ago Essential hypertension   [place], Jarome Matin, RPH-CPP   1 year ago Essential hypertension   [place], Vernia Buff, NP   1 year ago Essential hypertension   [place], [place], NP      Future Appointments            In 1 week Gildardo Pounds, NP [place]

## 2020-01-21 NOTE — Telephone Encounter (Signed)
Copied from [place] [id number]. Topic: General - Other >> Jan 21, 2020 10:15 AM Yvette Rack wrote: Reason for CRM: Pt daughter Madlyn Frankel requests doctor's note to relieve pt of jury duty due to headaches and him having a difficult time walking. Cb# [phone]

## 2020-01-22 ENCOUNTER — Other Ambulatory Visit: Payer: Self-pay | Admitting: Nurse Practitioner

## 2020-01-22 NOTE — Telephone Encounter (Signed)
Requested medication (s) are due for refill today: no  Requested medication (s) are on the active medication list: yes   Last refill: 06/10/2019  Future visit scheduled Yes  Notes to clinic:  medication was filled by a different provider    Requested Prescriptions  Pending Prescriptions Disp Refills   atorvastatin (LIPITOR) 20 MG tablet [Pharmacy Med Name: ATORVASTATIN 20MG  TABLETS] 90 tablet 1    Sig: TAKE 1 TABLET(20 MG) BY MOUTH DAILY AT 6 PM      There is no refill protocol information for this order

## 2020-01-22 NOTE — Telephone Encounter (Signed)
Spoke to [place] and patient's Madaline Savage Duty date is December 15th.  She stated patient is having difficult time walking and having headaches from his stroke.

## 2020-01-24 NOTE — Telephone Encounter (Signed)
Will discuss at office visit.

## 2020-01-28 ENCOUNTER — Encounter: Payer: Self-pay | Admitting: Nurse Practitioner

## 2020-01-28 ENCOUNTER — Other Ambulatory Visit: Payer: Self-pay

## 2020-01-28 ENCOUNTER — Ambulatory Visit: Payer: PPO | Attending: Nurse Practitioner | Admitting: Nurse Practitioner

## 2020-01-28 VITALS — BP 137/80 | HR 69 | Temp 97.7°F | Ht 67.0 in | Wt 147.0 lb

## 2020-01-28 DIAGNOSIS — I10 Essential (primary) hypertension: Secondary | ICD-10-CM

## 2020-01-28 DIAGNOSIS — R7303 Prediabetes: Secondary | ICD-10-CM

## 2020-01-28 DIAGNOSIS — N1831 Chronic kidney disease, stage 3a: Secondary | ICD-10-CM

## 2020-01-28 LAB — GLUCOSE, POCT (MANUAL RESULT ENTRY): POC Glucose: 82 mg/dl (ref 70–99)

## 2020-01-28 MED ORDER — ASPIRIN EC 81 MG PO TBEC: 81.0000 mg | DELAYED_RELEASE_TABLET | Freq: Every day | ORAL | 3 refills | Status: AC

## 2020-01-28 NOTE — Progress Notes (Signed)
Assessment & Plan:  Thomas [name] was seen today for follow-up.  Diagnoses and all orders for this visit:  Essential hypertension -     CMP14+EGFR Continue all antihypertensives as prescribed.  Remember to bring in your blood pressure log with you for your follow up appointment.  DASH/Mediterranean Diets are healthier choices for HTN.    Prediabetes -     Glucose (CBG) -     CMP14+EGFR  Stage 3a chronic kidney disease ([facility]) -     CBC with Differential    Patient has been counseled on age-appropriate routine health concerns for screening and prevention. These are reviewed and up-to-date. Referrals have been placed accordingly. Immunizations are up-to-date or declined.    Subjective:   Chief Complaint  Patient presents with  . Follow-up    Pt. is here for 3 months F.U. Pt. would like to discuss w/ PCP on jury duty note. Pt. stated he can't walk too far, gets dizzy, and feel dizzy sitting too long.    HPI Thomas [name] 64 y.o. male presents to office today for follow up. He is accompanied by his daughter today. Requesting a letter to excuse from jury duty due to his current medical conditions.  He has a history of multiple remote strokes with residual right leg numbness and chronic dizziness/BPV.   Essential Hypertension Well controlled. Taking amlodipine 5 mg daily and losartan 50 mg daily as prescribed. Denies chest pain, shortness of breath, palpitations,  headaches or BLE edema. He does experience dizziness with prolonged sitting and walking.  BP Readings from Last 3 Encounters:  01/28/20 137/80  12/16/19 106/72  10/24/19 (!) 138/80    Review of Systems  Constitutional: Negative for fever, malaise/fatigue and weight loss.  HENT: Negative.  Negative for nosebleeds.   Eyes: Negative.  Negative for blurred vision, double vision and photophobia.  Respiratory: Negative.  Negative for cough and shortness of breath.   Cardiovascular: Negative.  Negative for chest pain, palpitations  and leg swelling.  Gastrointestinal: Negative.  Negative for heartburn, nausea and vomiting.  Musculoskeletal: Negative.  Negative for myalgias.  Neurological: Positive for dizziness and sensory change. Negative for focal weakness, seizures and headaches.  Psychiatric/Behavioral: Negative.  Negative for suicidal ideas.    Past Medical History:  Diagnosis Date  . Chronic kidney disease   . Hyperlipidemia   . Hypertension   . Occlusion of right middle cerebral artery   . PVD (peripheral vascular disease) ([place])   . Ruptured abdominal aortic aneurysm (AAA) ([place])   . Stroke ([place]) 06/2017  . Thoracic aortic aneurysm (TAA) ([place])     Past Surgical History:  Procedure Laterality Date  . COLONOSCOPY  2010   Dr.Hung  . ENDOVASCULAR STENT INSERTION N/A 07/01/2017   Procedure: ENDOVASCULAR STENT GRAFT INSERTION;  Surgeon: Serafina Mitchell, MD;  Location: [facility] OR;  Service: Vascular;  Laterality: N/A;  . THROMBECTOMY FEMORAL ARTERY Bilateral 07/01/2017   Procedure: Bilateral IlioFemoral Thrombectomies;  Surgeon: Serafina Mitchell, MD;  Location: [facility] OR;  Service: Vascular;  Laterality: Bilateral;    Family History  Family history unknown: Yes    Social History Reviewed with no changes to be made today.   Outpatient Medications Prior to Visit  Medication Sig Dispense Refill  . amLODipine (NORVASC) 5 MG tablet Take 1 tablet (5 mg total) by mouth daily. 90 tablet 3  . atorvastatin (LIPITOR) 20 MG tablet TAKE 1 TABLET(20 MG) BY MOUTH DAILY AT 6 PM 90 tablet 1  . losartan (COZAAR) 50 MG  tablet TAKE 1 TABLET BY MOUTH ONCE DAILY 90 tablet 3  . omeprazole (PRILOSEC) 20 MG capsule TAKE 1 CAPSULE(20 MG) BY MOUTH DAILY 90 capsule 1   No facility-administered medications prior to visit.    No Known Allergies     Objective:    BP 137/80 (BP Location: Left Arm, Patient Position: Sitting, Cuff Size: Normal)   Pulse 69   Temp 97.7 F (36.5 C) (Temporal)   Ht _0  (1.702 m)   Wt 147 lb (66.7 kg)    SpO2 99%   BMI 23.02 kg/m  Wt Readings from Last 3 Encounters:  01/28/20 147 lb (66.7 kg)  12/16/19 147 lb (66.7 kg)  10/22/19 146 lb 9.6 oz (66.5 kg)    Physical Exam Vitals and nursing note reviewed.  Constitutional:      Appearance: He is well-developed.  HENT:     Head: Normocephalic and atraumatic.  Cardiovascular:     Rate and Rhythm: Normal rate and regular rhythm.     Heart sounds: Normal heart sounds. No murmur heard.  No friction rub. No gallop.   Pulmonary:     Effort: Pulmonary effort is normal. No tachypnea or respiratory distress.     Breath sounds: Normal breath sounds. No decreased breath sounds, wheezing, rhonchi or rales.  Chest:     Chest wall: No tenderness.  Abdominal:     General: Bowel sounds are normal.     Palpations: Abdomen is soft.  Musculoskeletal:        General: Normal range of motion.     Cervical back: Normal range of motion.  Skin:    General: Skin is warm and dry.  Neurological:     Mental Status: He is alert and oriented to person, place, and time.     Motor: Weakness (right leg) present.     Coordination: Coordination normal.     Comments: Uses straight cane  Psychiatric:        Behavior: Behavior normal. Behavior is cooperative.        Thought Content: Thought content normal.        Judgment: Judgment normal.          Patient has been counseled extensively about nutrition and exercise as well as the importance of adherence with medications and regular follow-up. The patient was given clear instructions to go to ER or return to medical center if symptoms don't improve, worsen or new problems develop. The patient verbalized understanding.   Follow-up: Return in about 3 months (around 04/29/2020).   Gildardo Pounds, FNP-BC [facility] and [facility] [place], [place]   01/28/2020, 9:58 AM

## 2020-01-29 LAB — CBC WITH DIFFERENTIAL/PLATELET
Basophils Absolute: 0.1 10*3/uL (ref 0.0–0.2)
Basos: 1 %
EOS (ABSOLUTE): 2 10*3/uL — ABNORMAL HIGH (ref 0.0–0.4)
Eos: 17 %
Hematocrit: 45.2 % (ref 37.5–51.0)
Hemoglobin: 14.6 g/dL (ref 13.0–17.7)
Immature Grans (Abs): 0 10*3/uL (ref 0.0–0.1)
Immature Granulocytes: 0 %
Lymphocytes Absolute: 4.3 10*3/uL — ABNORMAL HIGH (ref 0.7–3.1)
Lymphs: 37 %
MCH: 26.3 pg — ABNORMAL LOW (ref 26.6–33.0)
MCHC: 32.3 g/dL (ref 31.5–35.7)
MCV: 81 fL (ref 79–97)
Monocytes Absolute: 0.9 10*3/uL (ref 0.1–0.9)
Monocytes: 8 %
Neutrophils Absolute: 4.3 10*3/uL (ref 1.4–7.0)
Neutrophils: 37 %
Platelets: 371 10*3/uL (ref 150–450)
RBC: 5.56 x10E6/uL (ref 4.14–5.80)
RDW: 14.8 % (ref 11.6–15.4)
WBC: 11.6 10*3/uL — ABNORMAL HIGH (ref 3.4–10.8)

## 2020-01-29 LAB — CMP14+EGFR
ALT: 18 IU/L (ref 0–44)
AST: 18 IU/L (ref 0–40)
Albumin/Globulin Ratio: 1.1 — ABNORMAL LOW (ref 1.2–2.2)
Albumin: 4.3 g/dL (ref 3.8–4.8)
Alkaline Phosphatase: 117 IU/L (ref 44–121)
BUN/Creatinine Ratio: 9 — ABNORMAL LOW (ref 10–24)
BUN: 15 mg/dL (ref 8–27)
Bilirubin Total: 0.4 mg/dL (ref 0.0–1.2)
CO2: 24 mmol/L (ref 20–29)
Calcium: 9.9 mg/dL (ref 8.6–10.2)
Chloride: 104 mmol/L (ref 96–106)
Creatinine, Ser: 1.65 mg/dL — ABNORMAL HIGH (ref 0.76–1.27)
GFR calc Af Amer: 49 mL/min/{1.73_m2} — ABNORMAL LOW (ref >59)
GFR calc non Af Amer: 43 mL/min/{1.73_m2} — ABNORMAL LOW (ref >59)
Globulin, Total: 4 g/dL (ref 1.5–4.5)
Glucose: 76 mg/dL (ref 65–99)
Potassium: 5.2 mmol/L (ref 3.5–5.2)
Sodium: 141 mmol/L (ref 134–144)
Total Protein: 8.3 g/dL (ref 6.0–8.5)

## 2020-02-10 ENCOUNTER — Other Ambulatory Visit: Payer: Self-pay | Admitting: *Deleted

## 2020-02-10 DIAGNOSIS — I712 Thoracic aortic aneurysm, without rupture, unspecified: Secondary | ICD-10-CM

## 2020-02-23 ENCOUNTER — Other Ambulatory Visit: Payer: Self-pay

## 2020-02-23 ENCOUNTER — Ambulatory Visit (INDEPENDENT_AMBULATORY_CARE_PROVIDER_SITE_OTHER): Payer: PPO | Admitting: Surgery

## 2020-02-23 ENCOUNTER — Ambulatory Visit (HOSPITAL_COMMUNITY)
Admission: RE | Admit: 2020-02-23 | Discharge: 2020-02-23 | Disposition: A | Discharge status: Home / Self Care | Payer: PPO | Source: Ambulatory Visit | Attending: Surgery | Admitting: Surgery

## 2020-02-23 ENCOUNTER — Encounter: Payer: Self-pay | Admitting: Surgery

## 2020-02-23 VITALS — BP 117/80 | HR 76 | Temp 97.6°F | Resp 20 | Ht 67.0 in | Wt 146.3 lb

## 2020-02-23 DIAGNOSIS — I714 Abdominal aortic aneurysm, without rupture, unspecified: Secondary | ICD-10-CM

## 2020-02-23 DIAGNOSIS — I712 Thoracic aortic aneurysm, without rupture, unspecified: Secondary | ICD-10-CM

## 2020-02-23 DIAGNOSIS — I70213 Atherosclerosis of native arteries of extremities with intermittent claudication, bilateral legs: Secondary | ICD-10-CM

## 2020-02-23 NOTE — Progress Notes (Signed)
Vascular and Vein Specialist of [facility]  Patient name: Thomas [name] MRN: [id number] DOB: [birth year] Sex: male   REASON FOR VISIT:    Follow up  HISOTRY OF PRESENT ILLNESS:   Thomas [name] is a 64 y.o. male who returns today for follow-up.  On 07/01/2017 he underwent endovascular repair of a ruptured abdominal aortic aneurysm.  This was initially done percutaneously however at the conclusion of the procedure was unhappy with blood flow to his feet and therefore bilateral femoral artery exposure was performed with bilateral endarterectomy.  Postoperatively, the patient continued to complain of pain in his abdomen as well as numbness in his right leg.  He has CT scan that showed residual thrombus within the right limb of his graft, however he continued to have palpable pedal pulses.  There is also concern of possible infection.  He was ultimately discharged however he was readmitted on 08/15/17 for concerns of aortic graft infection.  I had felt that his CT scan likely reflected inflammatory changes without infection.   He continues to complain of bilateral leg pain however the right leg bothers him more than the left.  He cannot determine the exact time when this occurred, however he has been complaining of leg issue since his operation.  He does not have any open wounds.  He does not have rest pain.  He follows Dr. Roxan Hockey for an ascending aortic aneurysm.  He takes an ARB for hypertension and a statin for hypercholesterolemia.  He is a former smoker  PAST MEDICAL HISTORY:   Past Medical History:  Diagnosis Date  . Chronic kidney disease   . Hyperlipidemia   . Hypertension   . Occlusion of right middle cerebral artery   . PVD (peripheral vascular disease) ([place])   . Ruptured abdominal aortic aneurysm (AAA) ([place])   . Stroke ([place]) 06/2017  . Thoracic aortic aneurysm (TAA) ([place])      FAMILY HISTORY:   Family History  Family history unknown: Yes     SOCIAL HISTORY:   Social History   Tobacco Use  . Smoking status: Former Smoker    Types: Cigarettes    Quit date: 07/16/2017    Years since quitting: 2.6  . Smokeless tobacco: Never Used  Substance Use Topics  . Alcohol use: No     ALLERGIES:   No Known Allergies   CURRENT MEDICATIONS:   Current Outpatient Medications  Medication Sig Dispense Refill  . amLODipine (NORVASC) 5 MG tablet Take 1 tablet (5 mg total) by mouth daily. 90 tablet 3  . aspirin EC 81 MG tablet Take 1 tablet (81 mg total) by mouth daily. Swallow whole. 90 tablet 3  . atorvastatin (LIPITOR) 20 MG tablet TAKE 1 TABLET(20 MG) BY MOUTH DAILY AT 6 PM 90 tablet 1  . losartan (COZAAR) 50 MG tablet TAKE 1 TABLET BY MOUTH ONCE DAILY 90 tablet 3  . omeprazole (PRILOSEC) 20 MG capsule TAKE 1 CAPSULE(20 MG) BY MOUTH DAILY 90 capsule 1   No current facility-administered medications for this visit.    REVIEW OF SYSTEMS:   [X]  denotes positive finding, [ ]  denotes negative finding Cardiac  Comments:  Chest pain or chest pressure:    Shortness of breath upon exertion:    Short of breath when lying flat:    Irregular heart rhythm:        Vascular    Pain in calf, thigh, or hip brought on by ambulation: x   Pain in feet at night that wakes you  up from your sleep:     Blood clot in your veins:    Leg swelling:         Pulmonary    Oxygen at home:    Productive cough:     Wheezing:         Neurologic    Sudden weakness in arms or legs:     Sudden numbness in arms or legs:     Sudden onset of difficulty speaking or slurred speech:    Temporary loss of vision in one eye:     Problems with dizziness:         Gastrointestinal    Blood in stool:     Vomited blood:         Genitourinary    Burning when urinating:     Blood in urine:        Psychiatric    Major depression:         Hematologic    Bleeding problems:    Problems with blood clotting too easily:        Skin    Rashes or ulcers:         Constitutional    Fever or chills:      PHYSICAL EXAM:   Vitals:   02/23/20 0834  BP: 117/80  Pulse: 76  Resp: 20  Temp: 97.6 F (36.4 C)  SpO2: 96%  Weight: 146 lb 4.8 oz (66.4 kg)  Height: 5\' 7"  (1.702 m)    GENERAL: The patient is a well-nourished male, in no acute distress. The vital signs are documented above. CARDIAC: There is a regular rate and rhythm.  VASCULAR: Palpable left dorsalis pedis pulse.  Monophasic Doppler signal on the right.  I could not palpate either femoral pulse PULMONARY: Non-labored respirations ABDOMEN: Soft and non-tender with normal pitched bowel sounds.  MUSCULOSKELETAL: There are no major deformities or cyanosis. NEUROLOGIC: No focal weakness or paresthesias are detected. SKIN: There are no ulcers or rashes noted. PSYCHIATRIC: The patient has a normal affect.  STUDIES:   I have reviewed the following: U/s shows no endoleak.  Max diameter is 3.81 cm  MEDICAL ISSUES:   AAA: Ultrasound shows no evidence of endoleak with maximal aortic diameter of 3.81 cm.  I will continue to follow this with ultrasound  PAD: The patient has had leg pain since his operation however the last time I saw him, he had palpable pedal pulses.  Today I was unable to palpate a right pedal pulse, and it had a monophasic Doppler signal.  The right leg bothers him more than the left.  I discussed getting a CT angiogram of bilateral runoff as I was also unable to palpate femoral pulses.  He will return for further discussions after his CT scan.    Leia Alf, MD, FACS Vascular and Vein Specialists of [facility] [phone] Pager [phone]

## 2020-03-04 ENCOUNTER — Other Ambulatory Visit: Payer: Self-pay | Admitting: *Deleted

## 2020-03-04 DIAGNOSIS — I714 Abdominal aortic aneurysm, without rupture, unspecified: Secondary | ICD-10-CM

## 2020-03-12 ENCOUNTER — Ambulatory Visit
Admission: RE | Admit: 2020-03-12 | Discharge: 2020-03-12 | Disposition: A | Discharge status: Home / Self Care | Payer: PPO | Source: Ambulatory Visit | Attending: Surgery | Admitting: Surgery

## 2020-03-12 DIAGNOSIS — I701 Atherosclerosis of renal artery: Secondary | ICD-10-CM | POA: Diagnosis not present

## 2020-03-12 DIAGNOSIS — I739 Peripheral vascular disease, unspecified: Secondary | ICD-10-CM | POA: Diagnosis not present

## 2020-03-12 DIAGNOSIS — I714 Abdominal aortic aneurysm, without rupture, unspecified: Secondary | ICD-10-CM

## 2020-03-12 MED ORDER — IOPAMIDOL (ISOVUE-370) INJECTION 76%
125.0000 mL | Freq: Once | INTRAVENOUS | Status: AC | PRN
  Administered 2020-03-12: 125 mL via INTRAVENOUS

## 2020-03-15 ENCOUNTER — Encounter: Payer: Self-pay | Admitting: Surgery

## 2020-03-15 ENCOUNTER — Ambulatory Visit (HOSPITAL_COMMUNITY)
Admission: RE | Admit: 2020-03-15 | Discharge: 2020-03-15 | Disposition: A | Discharge status: Home / Self Care | Payer: PPO | Source: Ambulatory Visit | Attending: Surgery | Admitting: Surgery

## 2020-03-15 ENCOUNTER — Ambulatory Visit (INDEPENDENT_AMBULATORY_CARE_PROVIDER_SITE_OTHER): Payer: PPO | Admitting: Surgery

## 2020-03-15 ENCOUNTER — Other Ambulatory Visit: Payer: Self-pay

## 2020-03-15 VITALS — BP 125/81 | HR 62 | Temp 97.7°F | Resp 20 | Ht 67.0 in | Wt 146.0 lb

## 2020-03-15 DIAGNOSIS — I714 Abdominal aortic aneurysm, without rupture, unspecified: Secondary | ICD-10-CM

## 2020-03-15 DIAGNOSIS — I70211 Atherosclerosis of native arteries of extremities with intermittent claudication, right leg: Secondary | ICD-10-CM | POA: Diagnosis not present

## 2020-03-15 NOTE — Progress Notes (Signed)
Vascular and Vein Specialist of [facility]  Patient name: Thomas [name] MRN: [id number] DOB: 05/09/1955 Sex: male   REASON FOR VISIT:    Follow-up  HISOTRY OF PRESENT ILLNESS:   Thomas [name] a 64 y.o.malewho returns today for follow-up. On 07/01/2017 he underwent endovascular repair of a ruptured abdominal aortic aneurysm. This was initially done percutaneously however at the conclusion of the procedure was unhappy with blood flow to his feet and therefore bilateral femoral artery exposure was performed with bilateral endarterectomy. Postoperatively, the patient continued to complain of pain in his abdomen as well as numbness in his right leg. He has CT scan that showed residual thrombus within the right limb of his graft, however he continued to have palpable pedal pulses. There is also concern of possible infection. He was ultimately discharged however he was readmitted on 08/15/17 for concerns of aortic graft infection. I had felt that his CT scan likely reflected inflammatory changes without infection.   He continues to complain of bilateral leg pain however the right leg bothers him more than the left.  He cannot determine the exact time when this occurred, however he has been complaining of leg issue since his operation.  He does not have any open wounds.  He does not have rest pain.  He follows Dr. Roxan Hockey for an ascending aortic aneurysm.  He takes an ARB for hypertension and a statin for hypercholesterolemia.  He is a former smoker.  Because of his right leg pain and the fact that I could not palpate a right femoral pulse, I sent him for CT scan.  He is back today to discuss these results.   PAST MEDICAL HISTORY:   Past Medical History:  Diagnosis Date  . Chronic kidney disease   . Hyperlipidemia   . Hypertension   . Occlusion of right middle cerebral artery   . PVD (peripheral vascular disease) ([place])   . Ruptured abdominal aortic  aneurysm (AAA) ([place])   . Stroke ([place]) 06/2017  . Thoracic aortic aneurysm (TAA) ([place])      FAMILY HISTORY:   Family History  Family history unknown: Yes    SOCIAL HISTORY:   Social History   Tobacco Use  . Smoking status: Former Smoker    Types: Cigarettes    Quit date: 07/16/2017    Years since quitting: 2.6  . Smokeless tobacco: Never Used  Substance Use Topics  . Alcohol use: No     ALLERGIES:   No Known Allergies   CURRENT MEDICATIONS:   Current Outpatient Medications  Medication Sig Dispense Refill  . amLODipine (NORVASC) 5 MG tablet Take 1 tablet (5 mg total) by mouth daily. 90 tablet 3  . aspirin EC 81 MG tablet Take 1 tablet (81 mg total) by mouth daily. Swallow whole. 90 tablet 3  . atorvastatin (LIPITOR) 20 MG tablet TAKE 1 TABLET(20 MG) BY MOUTH DAILY AT 6 PM 90 tablet 1  . losartan (COZAAR) 50 MG tablet TAKE 1 TABLET BY MOUTH ONCE DAILY 90 tablet 3  . omeprazole (PRILOSEC) 20 MG capsule TAKE 1 CAPSULE(20 MG) BY MOUTH DAILY 90 capsule 1   No current facility-administered medications for this visit.    REVIEW OF SYSTEMS:   [X]  denotes positive finding, [ ]  denotes negative finding Cardiac  Comments:  Chest pain or chest pressure:    Shortness of breath upon exertion:    Short of breath when lying flat:    Irregular heart rhythm:        Vascular  Pain in calf, thigh, or hip brought on by ambulation: x   Pain in feet at night that wakes you up from your sleep:     Blood clot in your veins:    Leg swelling:         Pulmonary    Oxygen at home:    Productive cough:     Wheezing:         Neurologic    Sudden weakness in arms or legs:     Sudden numbness in arms or legs:     Sudden onset of difficulty speaking or slurred speech:    Temporary loss of vision in one eye:     Problems with dizziness:         Gastrointestinal    Blood in stool:     Vomited blood:         Genitourinary    Burning when urinating:     Blood in urine:         Psychiatric    Major depression:         Hematologic    Bleeding problems:    Problems with blood clotting too easily:        Skin    Rashes or ulcers:        Constitutional    Fever or chills:      PHYSICAL EXAM:   Vitals:   03/15/20 0903  BP: 125/81  Pulse: 62  Resp: 20  Temp: 97.7 F (36.5 C)  SpO2: 98%  Weight: 146 lb (66.2 kg)  Height: 5\' 7"  (1.702 m)    GENERAL: The patient is a well-nourished male, in no acute distress. The vital signs are documented above. CARDIAC: There is a regular rate and rhythm.  VASCULAR: Nonpalpable right femoral pulse PULMONARY: Non-labored respirations MUSCULOSKELETAL: There are no major deformities or cyanosis. NEUROLOGIC: No focal weakness or paresthesias are detected. SKIN: There are no ulcers or rashes noted. PSYCHIATRIC: The patient has a normal affect.  STUDIES:   I have reviewed his CT scan with the following findings: . Interval thrombosis of right limb of the infrarenal stent graft extending through the length of the native external iliac artery, with collateral reconstitution of the common femoral artery. 2. Interval decrease in native aortic aneurysm sac diameter. No definite endoleak although pre contrast and delayed scans were not obtained. 3. Stable 1.4 cm hypervascular left adrenal nodule,  nonspecific. 4. Cholelithiasis. 5. Pulmonary fibrosis. MEDICAL ISSUES:   AAA: This remains stable with maximum diameter 3.8 cm   PAD: Interval occlusion of the right limb to his aortic graft which is the likely explanation for his right leg pain.  I discussed proceeding with a left to right femoral-femoral bypass graft.  I discussed the details of the procedure including the risks and benefits.  All questions were answered.  He would like to further discuss this with his family before making a decision.  He will contact me to schedule a femoral-femoral bypass graft with bilateral redo groin incisions, if he decides to go in  this direction.  I did discuss that if he gets a wound on his foot that does not appear to be healing that we would need to consider more urgent repair.  If he decides not to proceed with surgery, I have him scheduled for follow-up in 6 months.    Leia Alf, MD, FACS Vascular and Vein Specialists of [facility] [phone] Pager [phone]

## 2020-03-22 ENCOUNTER — Other Ambulatory Visit: Payer: Self-pay

## 2020-04-16 NOTE — Progress Notes (Signed)
Your procedure is scheduled on Wednesday, April 21, 2020 (7:30AM - 11:37AM).  Report to [facility] Main Entrance "A" at 5:30 A.M., and check in at the Admitting office.  Call this number if you have problems the morning of surgery:  [phone]  Call [phone] if you have any questions prior to your surgery date Monday-Friday 8am-4pm    Remember:  Do not eat or drink after midnight the night before your surgery    Take these medicines the morning of surgery with A SIP OF WATER:  amLODipine (NORVASC)  aspirin EC atorvastatin (LIPITOR) omeprazole (PRILOSEC)  As of today, STOP taking any Aleve, Naproxen, Ibuprofen, Motrin, Advil, Goody's, BC's, all herbal medications, fish oil, and all vitamins.                      Do not wear jewelry.            Do not wear lotions, powders, colognes, or deodorant.            Do not shave 48 hours prior to surgery.  Men may shave face and neck.            Do not bring valuables to the hospital.            [facility] is not responsible for any belongings or valuables.  Do NOT Smoke (Tobacco/Vaping) or drink Alcohol 24 hours prior to your procedure If you use a CPAP at night, you may bring all equipment for your overnight stay.   Contacts, glasses, dentures or bridgework may not be worn into surgery.      For patients admitted to the hospital, discharge time will be determined by your treatment team.   Patients discharged the day of surgery will not be allowed to drive home, and someone needs to stay with them for 24 hours.    Special instructions:   [place]- Preparing For Surgery  Before surgery, you can play an important role. Because skin is not sterile, your skin needs to be as free of germs as possible. You can reduce the number of germs on your skin by washing with CHG (chlorahexidine gluconate) Soap before surgery.  CHG is an antiseptic cleaner which kills germs and bonds with the skin to continue killing germs even after  washing.    Oral Hygiene is also important to reduce your risk of infection.  Remember - BRUSH YOUR TEETH THE MORNING OF SURGERY WITH YOUR REGULAR TOOTHPASTE  Please do not use if you have an allergy to CHG or antibacterial soaps. If your skin becomes reddened/irritated stop using the CHG.  Do not shave (including legs and underarms) for at least 48 hours prior to first CHG shower. It is OK to shave your face.  Please follow these instructions carefully.   1. Shower the NIGHT BEFORE SURGERY and the MORNING OF SURGERY with CHG Soap.   2. If you chose to wash your hair, wash your hair first as usual with your normal shampoo.  3. After you shampoo, rinse your hair and body thoroughly to remove the shampoo.  4. Use CHG as you would any other liquid soap. You can apply CHG directly to the skin and wash gently with a scrungie or a clean washcloth.   5. Apply the CHG Soap to your body ONLY FROM THE NECK DOWN.  Do not use on open wounds or open sores. Avoid contact with your eyes, ears, mouth and genitals (private parts). Wash Face and  genitals (private parts)  with your normal soap.   6. Wash thoroughly, paying special attention to the area where your surgery will be performed.  7. Thoroughly rinse your body with warm water from the neck down.  8. DO NOT shower/wash with your normal soap after using and rinsing off the CHG Soap.  9. Pat yourself dry with a CLEAN TOWEL.  10. Wear CLEAN PAJAMAS to bed the night before surgery  11. Place CLEAN SHEETS on your bed the night of your first shower and DO NOT SLEEP WITH PETS.   Day of Surgery: Wear Clean/Comfortable clothing the morning of surgery Do not apply any deodorants/lotions.   Remember to brush your teeth WITH YOUR REGULAR TOOTHPASTE.   Please read over the following fact sheets that you were given.

## 2020-04-19 ENCOUNTER — Encounter (HOSPITAL_COMMUNITY)
Admission: RE | Admit: 2020-04-19 | Discharge: 2020-04-19 | Disposition: A | Discharge status: Home / Self Care | Payer: PPO | Source: Ambulatory Visit | Attending: Surgery | Admitting: Surgery

## 2020-04-19 ENCOUNTER — Encounter (HOSPITAL_COMMUNITY): Payer: Self-pay

## 2020-04-19 ENCOUNTER — Other Ambulatory Visit: Payer: Self-pay

## 2020-04-19 DIAGNOSIS — I10 Essential (primary) hypertension: Secondary | ICD-10-CM | POA: Diagnosis not present

## 2020-04-19 DIAGNOSIS — I4510 Unspecified right bundle-branch block: Secondary | ICD-10-CM | POA: Diagnosis not present

## 2020-04-19 DIAGNOSIS — Z01818 Encounter for other preprocedural examination: Secondary | ICD-10-CM | POA: Diagnosis not present

## 2020-04-19 HISTORY — DX: Gastro-esophageal reflux disease without esophagitis: K21.9

## 2020-04-19 HISTORY — DX: Personal history of urinary calculi: Z87.442

## 2020-04-19 LAB — CBC
HCT: 47.4 % (ref 39.0–52.0)
Hemoglobin: 15.4 g/dL (ref 13.0–17.0)
MCH: 25.5 pg — ABNORMAL LOW (ref 26.0–34.0)
MCHC: 32.5 g/dL (ref 30.0–36.0)
MCV: 78.3 fL — ABNORMAL LOW (ref 80.0–100.0)
Platelets: 320 10*3/uL (ref 150–400)
RBC: 6.05 MIL/uL — ABNORMAL HIGH (ref 4.22–5.81)
RDW: 15.2 % (ref 11.5–15.5)
WBC: 10 10*3/uL (ref 4.0–10.5)
nRBC: 0 % (ref 0.0–0.2)

## 2020-04-19 LAB — URINALYSIS, ROUTINE W REFLEX MICROSCOPIC
Bilirubin Urine: NEGATIVE
Glucose, UA: NEGATIVE mg/dL
Hgb urine dipstick: NEGATIVE
Ketones, ur: NEGATIVE mg/dL
Leukocytes,Ua: NEGATIVE
Nitrite: NEGATIVE
Protein, ur: NEGATIVE mg/dL
Specific Gravity, Urine: 1.013 (ref 1.005–1.030)
pH: 5 (ref 5.0–8.0)

## 2020-04-19 LAB — BLOOD GAS, ARTERIAL
Acid-base deficit: 4.4 mmol/L — ABNORMAL HIGH (ref 0.0–2.0)
Bicarbonate: 19.2 mmol/L — ABNORMAL LOW (ref 20.0–28.0)
FIO2: 21
O2 Saturation: 96.6 %
Patient temperature: 37
pCO2 arterial: 29.8 mmHg — ABNORMAL LOW (ref 32.0–48.0)
pH, Arterial: 7.424 (ref 7.350–7.450)
pO2, Arterial: 86.9 mmHg (ref 83.0–108.0)

## 2020-04-19 LAB — SURGICAL PCR SCREEN
MRSA, PCR: NEGATIVE
Staphylococcus aureus: POSITIVE — AB

## 2020-04-19 LAB — COMPREHENSIVE METABOLIC PANEL
ALT: 23 U/L (ref 0–44)
AST: 31 U/L (ref 15–41)
Albumin: 3.7 g/dL (ref 3.5–5.0)
Alkaline Phosphatase: 93 U/L (ref 38–126)
Anion gap: 12 (ref 5–15)
BUN: 23 mg/dL (ref 8–23)
CO2: 17 mmol/L — ABNORMAL LOW (ref 22–32)
Calcium: 9.2 mg/dL (ref 8.9–10.3)
Chloride: 103 mmol/L (ref 98–111)
Creatinine, Ser: 1.76 mg/dL — ABNORMAL HIGH (ref 0.61–1.24)
GFR, Estimated: 42 mL/min — ABNORMAL LOW (ref >60)
Glucose, Bld: 129 mg/dL — ABNORMAL HIGH (ref 70–99)
Potassium: 4.2 mmol/L (ref 3.5–5.1)
Sodium: 132 mmol/L — ABNORMAL LOW (ref 135–145)
Total Bilirubin: 0.8 mg/dL (ref 0.3–1.2)
Total Protein: 8.5 g/dL — ABNORMAL HIGH (ref 6.5–8.1)

## 2020-04-19 LAB — PROTIME-INR
INR: 1 (ref 0.8–1.2)
Prothrombin Time: 12.7 seconds (ref 11.4–15.2)

## 2020-04-19 LAB — APTT: aPTT: 38 seconds — ABNORMAL HIGH (ref 24–36)

## 2020-04-19 LAB — TYPE AND SCREEN
ABO/RH(D): O POS
Antibody Screen: NEGATIVE

## 2020-04-19 NOTE — Progress Notes (Signed)
PCP - Gildardo Pounds, NP Cardiologist - Revonda Standard. Roxan Hockey, MD  PPM/ICD - Denies  Chest x-ray - N/A EKG - 04/19/20 Stress Test - Denies ECHO - 06/24/18 Cardiac Cath - Denies   Sleep Study - Denies  Patient denies being diabetic.  Blood Thinner Instructions: N/A Aspirin Instructions: Continue  ERAS Protcol - N/A PRE-SURGERY Ensure or G2- N/A  COVID TEST- 04/20/20; Pt instructed to quarantine after test, until DOS. Pt c/o fatigue today during PAT appointment. Further c/o "not feeling well" for a few days now   Anesthesia review: Yes, cardiac/ vascular and neuro hx.  Patient denies shortness of breath, fever, cough and chest pain at PAT appointment   All instructions explained to the patient, with a verbal understanding of the material. Patient agrees to go over the instructions while at home for a better understanding. Patient also instructed to self quarantine after being tested for COVID-19. The opportunity to ask questions was provided.

## 2020-04-20 ENCOUNTER — Other Ambulatory Visit (HOSPITAL_COMMUNITY)
Admission: RE | Admit: 2020-04-20 | Discharge: 2020-04-20 | Disposition: A | Discharge status: Home / Self Care | Payer: PPO | Source: Ambulatory Visit | Attending: Surgery | Admitting: Surgery

## 2020-04-20 ENCOUNTER — Encounter (HOSPITAL_COMMUNITY): Payer: Self-pay | Admitting: Anesthesiology

## 2020-04-20 ENCOUNTER — Encounter (HOSPITAL_COMMUNITY): Payer: Self-pay | Admitting: Physician Assistant

## 2020-04-20 DIAGNOSIS — Z01812 Encounter for preprocedural laboratory examination: Secondary | ICD-10-CM | POA: Insufficient documentation

## 2020-04-20 DIAGNOSIS — U071 COVID-19: Secondary | ICD-10-CM | POA: Insufficient documentation

## 2020-04-20 LAB — SARS CORONAVIRUS 2 (TAT 6-24 HRS): SARS Coronavirus 2: POSITIVE — AB

## 2020-04-20 NOTE — Progress Notes (Signed)
Anesthesia Chart Review:  Follows with neurologist Dr. Leonie Man for history of multiple strokes with minimal residual.  Last seen 01/15/2019.  [place],, "65 year old Asian male with history of multiple remote strokes with minimal residual chronic right leg numbness who presented on 06/23/2018 with exacerbation of chronic dizziness in the setting of new onset of vomiting and dehydration and hypotension. His right temporal periventricular white matter stroke was likely incidental in the setting of failure of collaterals from dehydration and hypertension with known chronic right MCA occlusion. He also has chronic positional dizziness likely due to benign paroxysmal positional vertigo."  He was noted to be stable at that time, recommended continue secondary prevention 81 mg ASA and strict control of hypertension.  Follows with cardiothoracic surgeon Dr. Roxan Hockey for history of thoracic aortic aneurysm.  Aneurysm was discovered in 2019 he was hospitalized with pneumococcal sepsis.  At that time he had a ruptured 6.6 cm abdominal aneurysm which required emergent repair.  CT of the chest showed a reported 4.5 cm thoracic aortic aneurysm which has been followed every 6 months since then.    Aneurysm diameter now measuring 4.2 cm per most recent CT.  Patient was last seen by Dr. Roxan Hockey and due to stability of the aneurysm it was recommended to lengthen the follow-up interval to 1 year.  Follows with vascular surgeon Dr. Trula Slade for history of repair of ruptured abdominal aortic aneurysm on 07/01/2017.  Recent CT scan showed interval occlusion of the right limb to his aortic graft.  CKD 3.  History of interstitial lung disease seen on CT.  Preop labs reviewed, creatinine elevated at 1.76 consistent with history of CKD 3, sodium mildly low at 132, labs otherwise unremarkable.  EKG 04/19/2020: Normal sinus rhythm.  Rate 81. Possible Left atrial enlargement. Left axis deviation. Right bundle branch block  CT  chest 12/09/2019: IMPRESSION: 1. No change in diameter of ascending thoracic aortic aneurysm at 4.2 cm. 2. Stable chronic interstitial lung disease. 3. Coronary artery calcification and Aortic Atherosclerosis (ICD10-I70.0). 4. Stable indeterminate LEFT adrenal nodule.  TTE 06/24/2018: 1. The left ventricle has normal systolic function, with an ejection  fraction of 55-60%. The cavity size was normal. Left ventricular diastolic  parameters were normal.  2. The right ventricle has normal systolic function. The cavity was  normal. There is no increase in right ventricular wall thickness.  3. No evidence present in the left atrial appendage.  4. The aortic valve is tricuspid. Aortic valve regurgitation is trivial  by color flow Doppler.  5. No pulmonic valve vegetation visualized.   Wynonia Musty [facility] Short Stay Center/Anesthesiology Phone [phone] 04/20/2020 9:10 AM

## 2020-04-20 NOTE — Anesthesia Preprocedure Evaluation (Deleted)
Anesthesia Evaluation    Airway        Dental   Pulmonary former smoker,           Cardiovascular hypertension,      Neuro/Psych    GI/Hepatic   Endo/Other    Renal/GU      Musculoskeletal   Abdominal   Peds  Hematology   Anesthesia Other Findings   Reproductive/Obstetrics                                                              Anesthesia Evaluation  Patient identified by MRN, date of birth, ID band Patient awake    Reviewed: Allergy & Precautions, NPO status , Patient's Chart, lab work & pertinent test results  History of Anesthesia Complications Negative for: history of anesthetic complications  Airway Mallampati: II  TM Distance: >3 FB Neck ROM: Full    Dental  (+) Poor Dentition, Dental Advisory Given   Pulmonary Current Smoker,    breath sounds clear to auscultation       Cardiovascular hypertension, Pt. on medications + Peripheral Vascular Disease  negative cardio ROS Normal cardiovascular exam  Study Conclusions  - Left ventricle: The cavity size was normal. There was moderate   focal basal hypertrophy. Systolic function was normal. The   estimated ejection fraction was in the range of 60% to 65%. Wall   motion was normal; there were no regional wall motion   abnormalities. The study is not technically sufficient to allow   evaluation of LV diastolic function. - Aortic valve: Trileaflet; mildly thickened, mildly calcified   leaflets. There was mild regurgitation.    Neuro/Psych CVA negative neurological ROS  negative psych ROS   GI/Hepatic negative GI ROS, Neg liver ROS,   Endo/Other  negative endocrine ROS  Renal/GU Renal InsufficiencyRenal disease  negative genitourinary   Musculoskeletal negative musculoskeletal ROS (+)   Abdominal   Peds negative pediatric ROS (+)  Hematology negative hematology ROS (+)   Anesthesia Other  Findings   Reproductive/Obstetrics negative OB ROS                            Anesthesia Physical Anesthesia Plan  ASA: IV and emergent  Anesthesia Plan: General   Post-op Pain Management:    Induction: Intravenous  PONV Risk Score and Plan: 2 and Ondansetron and Dexamethasone  Airway Management Planned: Oral ETT  Additional Equipment: Arterial line, CVP and Ultrasound Guidance Line Placement  Intra-op Plan:   Post-operative Plan: Possible Post-op intubation/ventilation  Informed Consent: I have reviewed the patients History and Physical, chart, labs and discussed the procedure including the risks, benefits and alternatives for the proposed anesthesia with the patient or authorized representative who has indicated his/her understanding and acceptance.   Dental advisory given  Plan Discussed with: CRNA, Anesthesiologist and Surgeon  Anesthesia Plan Comments:       Anesthesia Quick Evaluation  Anesthesia Physical Anesthesia Plan  ASA:   Anesthesia Plan:    Post-op Pain Management:    Induction:   PONV Risk Score and Plan:   Airway Management Planned:   Additional Equipment:   Intra-op Plan:   Post-operative Plan:   Informed Consent:   Plan Discussed with:   Anesthesia Plan Comments: (PAT  note by Karoline Caldwell, PA-C: Follows with neurologist Dr. Leonie Man for history of multiple strokes with minimal residual.  Last seen 01/15/2019.  Pernote,, "65 year old Asian male with history of multiple remote strokes with minimal residual chronic right leg numbness who presented on 06/23/2018 with exacerbation of chronic dizziness in the setting of new onset of vomiting and dehydration and hypotension. His right temporal periventricular white matter stroke was likely incidental in the setting of failure of collaterals from dehydration and hypertension with known chronic right MCA occlusion. He also has chronic positional dizziness likely due to  benign paroxysmal positional vertigo."  He was noted to be stable at that time, recommended continue secondary prevention 81 mg ASA and strict control of hypertension.  Follows with cardiothoracic surgeon Dr. Roxan Hockey for history of thoracic aortic aneurysm.  Aneurysm was discovered in 2019 he was hospitalized with pneumococcal sepsis.  At that time he had a ruptured 6.6 cm abdominal aneurysm which required emergent repair.  CT of the chest showed a reported 4.5 cm thoracic aortic aneurysm which has been followed every 6 months since then.    Aneurysm diameter now measuring 4.2 cm per most recent CT.  Patient was last seen by Dr. Roxan Hockey and due to stability of the aneurysm it was recommended to lengthen the follow-up interval to 1 year.  Follows with vascular surgeon Dr. Trula Slade for history of repair of ruptured abdominal aortic aneurysm on 07/01/2017.  Recent CT scan showed interval occlusion of the right limb to his aortic graft.  CKD 3.  History of interstitial lung disease seen on CT.  Preop labs reviewed, creatinine elevated at 1.76 consistent with history of CKD 3, sodium mildly low at 132, labs otherwise unremarkable.  EKG 04/19/2020: Normal sinus rhythm.  Rate 81. Possible Left atrial enlargement. Left axis deviation. Right bundle branch block  CT chest 12/09/2019: IMPRESSION: 1. No change in diameter of ascending thoracic aortic aneurysm at 4.2 cm. 2. Stable chronic interstitial lung disease. 3. Coronary artery calcification and Aortic Atherosclerosis (ICD10-I70.0). 4. Stable indeterminate LEFT adrenal nodule.  TTE 06/24/2018: 1. The left ventricle has normal systolic function, with an ejection  fraction of 55-60%. The cavity size was normal. Left ventricular diastolic  parameters were normal.  2. The right ventricle has normal systolic function. The cavity was  normal. There is no increase in right ventricular wall thickness.  3. No evidence present in the left atrial  appendage.  4. The aortic valve is tricuspid. Aortic valve regurgitation is trivial  by color flow Doppler.  5. No pulmonic valve vegetation visualized. )        Anesthesia Quick Evaluation

## 2020-04-20 NOTE — Anesthesia Preprocedure Evaluation (Signed)
Anesthesia Evaluation    Reviewed: Allergy & Precautions, Patient's Chart, lab work & pertinent test results  History of Anesthesia Complications Negative for: history of anesthetic complications  Airway Mallampati: II  TM Distance: >3 FB Neck ROM: Full    Dental  (+) Poor Dentition, Dental Advisory Given   Pulmonary Current Smoker, former smoker,    breath sounds clear to auscultation       Cardiovascular hypertension, Pt. on medications + Peripheral Vascular Disease  negative cardio ROS Normal cardiovascular exam  Study Conclusions  - Left ventricle: The cavity size was normal. There was moderate   focal basal hypertrophy. Systolic function was normal. The   estimated ejection fraction was in the range of 60% to 65%. Wall   motion was normal; there were no regional wall motion   abnormalities. The study is not technically sufficient to allow   evaluation of LV diastolic function. - Aortic valve: Trileaflet; mildly thickened, mildly calcified   leaflets. There was mild regurgitation.    Neuro/Psych CVA negative neurological ROS  negative psych ROS   GI/Hepatic negative GI ROS, Neg liver ROS,   Endo/Other  negative endocrine ROS  Renal/GU Renal InsufficiencyRenal disease  negative genitourinary   Musculoskeletal negative musculoskeletal ROS (+)   Abdominal   Peds negative pediatric ROS (+)  Hematology negative hematology ROS (+)   Anesthesia Other Findings   Reproductive/Obstetrics negative OB ROS                             Anesthesia Physical  Anesthesia Plan  ASA: III  Anesthesia Plan: General   Post-op Pain Management:    Induction: Intravenous  PONV Risk Score and Plan: 3 and Ondansetron, Dexamethasone and Midazolam  Airway Management Planned: Oral ETT  Additional Equipment: Arterial line, CVP and Ultrasound Guidance Line Placement  Intra-op Plan:    Post-operative Plan: Possible Post-op intubation/ventilation  Informed Consent:   Plan Discussed with: Anesthesiologist  Anesthesia Plan Comments: (PAT note by Karoline Caldwell, PA-C: Follows with neurologist Dr. Leonie Man for history of multiple strokes with minimal residual.  Last seen 01/15/2019.  Pernote,, "65 year old Asian male with history of multiple remote strokes with minimal residual chronic right leg numbness who presented on 06/23/2018 with exacerbation of chronic dizziness in the setting of new onset of vomiting and dehydration and hypotension. His right temporal periventricular white matter stroke was likely incidental in the setting of failure of collaterals from dehydration and hypertension with known chronic right MCA occlusion. He also has chronic positional dizziness likely due to benign paroxysmal positional vertigo."  He was noted to be stable at that time, recommended continue secondary prevention 81 mg ASA and strict control of hypertension.  Follows with cardiothoracic surgeon Dr. Roxan Hockey for history of thoracic aortic aneurysm.  Aneurysm was discovered in 2019 he was hospitalized with pneumococcal sepsis.  At that time he had a ruptured 6.6 cm abdominal aneurysm which required emergent repair.  CT of the chest showed a reported 4.5 cm thoracic aortic aneurysm which has been followed every 6 months since then.    Aneurysm diameter now measuring 4.2 cm per most recent CT.  Patient was last seen by Dr. Roxan Hockey and due to stability of the aneurysm it was recommended to lengthen the follow-up interval to 1 year.  Follows with vascular surgeon Dr. Trula Slade for history of repair of ruptured abdominal aortic aneurysm on 07/01/2017.  Recent CT scan showed interval occlusion of the right limb to  his aortic graft.  CKD 3.  History of interstitial lung disease seen on CT.  Preop labs reviewed, creatinine elevated at 1.76 consistent with history of CKD 3, sodium mildly low at 132, labs  otherwise unremarkable.  EKG 04/19/2020: Normal sinus rhythm.  Rate 81. Possible Left atrial enlargement. Left axis deviation. Right bundle branch block  CT chest 12/09/2019: IMPRESSION: 1. No change in diameter of ascending thoracic aortic aneurysm at 4.2 cm. 2. Stable chronic interstitial lung disease. 3. Coronary artery calcification and Aortic Atherosclerosis (ICD10-I70.0). 4. Stable indeterminate LEFT adrenal nodule.  TTE 06/24/2018: 1. The left ventricle has normal systolic function, with an ejection  fraction of 55-60%. The cavity size was normal. Left ventricular diastolic  parameters were normal.  2. The right ventricle has normal systolic function. The cavity was  normal. There is no increase in right ventricular wall thickness.  3. No evidence present in the left atrial appendage.  4. The aortic valve is tricuspid. Aortic valve regurgitation is trivial  by color flow Doppler.  5. No pulmonic valve vegetation visualized. )        Anesthesia Quick Evaluation

## 2020-04-21 ENCOUNTER — Encounter (HOSPITAL_COMMUNITY): Payer: Self-pay | Admitting: Surgery

## 2020-04-21 ENCOUNTER — Encounter (HOSPITAL_COMMUNITY)
Admission: RE | Disposition: A | Discharge status: Home / Self Care | Payer: Self-pay | Source: Home / Self Care | Attending: Surgery

## 2020-04-21 ENCOUNTER — Ambulatory Visit (HOSPITAL_COMMUNITY)
Admission: RE | Admit: 2020-04-21 | Discharge: 2020-04-21 | Disposition: A | Discharge status: Home / Self Care | Payer: PPO | Attending: Surgery | Admitting: Surgery

## 2020-04-21 ENCOUNTER — Other Ambulatory Visit: Payer: Self-pay

## 2020-04-21 ENCOUNTER — Telehealth: Payer: Self-pay

## 2020-04-21 SURGERY — BYPASS GRAFT FEMORAL-POPLITEAL ARTERY
Anesthesia: General | Laterality: Bilateral

## 2020-04-21 NOTE — Progress Notes (Addendum)
Dr Radene Gunning paged for patients COVID positive result.  6:02 AM OR made aware of patients positive COVID result  6:07 AM Spoke to Dr Gloris Manchester with anesthesia about patients positive result.  He is aware of patients positive result

## 2020-04-21 NOTE — Telephone Encounter (Signed)
Called to discuss with patient about COVID-19 symptoms and the use of one of the available treatments for those with mild to moderate Covid symptoms and at a high risk of hospitalization.  Pt appears to qualify for outpatient treatment due to co-morbid conditions and/or a member of an at-risk group in accordance with the FDA Emergency Use Authorization.    Symptom onset: 04/19/20 per daughter Vaccinated: No Booster? No Immunocompromised? No Qualifiers: HTN, Stroke,CKD Unable to reach pt - Spoke with daughter.States pt. "only had some weakness, no other symptoms and he is feeling better." Declines any further treatment.  Marcello Moores

## 2020-04-21 NOTE — Progress Notes (Signed)
Patient with positive covid result. Contacted MD and informed of result.   

## 2020-04-21 NOTE — Progress Notes (Signed)
Per Dr. Trula Slade Patients surgery for today has been cancelled due to patients positive COVID result.  Patient notified of positive result and explained that if patient has developed any symptoms then he will need to contact his primary care doctor or seek immediate help at the emergency room if having worsening symptoms.  Dr. Trula Slade stated that he will have his office contact the patient and have him rescheduled in 2 weeks.   Patient discharged home with daughter

## 2020-04-22 ENCOUNTER — Other Ambulatory Visit: Payer: Self-pay

## 2020-04-22 ENCOUNTER — Telehealth: Payer: Self-pay

## 2020-04-22 NOTE — Telephone Encounter (Signed)
Spoke with daughter to r/s surgery after positive covid result

## 2020-04-30 ENCOUNTER — Ambulatory Visit: Payer: PPO | Attending: Nurse Practitioner | Admitting: Nurse Practitioner

## 2020-04-30 ENCOUNTER — Encounter: Payer: Self-pay | Admitting: Nurse Practitioner

## 2020-04-30 ENCOUNTER — Other Ambulatory Visit: Payer: Self-pay

## 2020-04-30 DIAGNOSIS — I10 Essential (primary) hypertension: Secondary | ICD-10-CM | POA: Diagnosis not present

## 2020-04-30 DIAGNOSIS — R7303 Prediabetes: Secondary | ICD-10-CM | POA: Diagnosis not present

## 2020-04-30 NOTE — Progress Notes (Signed)
Virtual Visit via Telephone Note Due to national recommendations of social distancing due to [place] 19, telehealth visit is felt to be most appropriate for this patient at this time.  I discussed the limitations, risks, security and privacy concerns of performing an evaluation and management service by telephone and the availability of in person appointments. I also discussed with the patient that there may be a patient responsible charge related to this service. The patient expressed understanding and agreed to proceed.    I connected with Thomas [name] on 04/30/20  at   8:30 AM EST  EDT by telephone and verified that I am speaking with the correct person using two identifiers.   Consent I discussed the limitations, risks, security and privacy concerns of performing an evaluation and management service by telephone and the availability of in person appointments. I also discussed with the patient that there may be a patient responsible charge related to this service. The patient expressed understanding and agreed to proceed.   Location of Patient: Private Residence   Location of Provider: [place] and [employer] Office    Persons participating in Telemedicine visit: Thomas Rankins FNP-BC [place]    History of Present Illness: Telemedicine visit for: Follow Up He has a past medical history of Chronic kidney disease, GERD, nephrolithiasis, Hyperlipidemia, Hypertension, Occlusion of right middle cerebral artery, PVD, Ruptured abdominal aortic aneurysm with endovascular repair 07/01/2017, Stroke (06/2017)    Diagnosed with COVID last week. Currently symptoms have resolved however his surgery for femorofemoral bypass had to be rescheduled due to him contracting Covid.  He has occlusion of the right leg and to his aortogram.  Prediabetes  He does not monitor his blood glucose levels at home. Well controlled with diet only and no oral diabetic medications.  Lab Results   Component Value Date   HGBA1C 5.9 (H) 10/22/2019   Essential Hypertension Blood pressure is well controlled.  He does have a blood pressure monitoring device at home but does not check his blood pressure routinely.  He endorses medication adherence taking amlodipine 5 mg daily and losartan 50 mg daily. Denies chest pain, shortness of breath, palpitations, lightheadedness, dizziness, headaches or BLE edema.  BP Readings from Last 3 Encounters:  04/19/20 97/70  03/15/20 125/81  02/23/20 117/80   Past Medical History:  Diagnosis Date  . Chronic kidney disease   . GERD (gastroesophageal reflux disease)   . History of kidney stones   . Hyperlipidemia   . Hypertension   . Occlusion of right middle cerebral artery   . PVD (peripheral vascular disease) ([place])   . Ruptured abdominal aortic aneurysm (AAA) ([place])   . Stroke ([place]) 06/2017  . Thoracic aortic aneurysm (TAA) ([place])     Past Surgical History:  Procedure Laterality Date  . CHOLECYSTECTOMY    . COLONOSCOPY  2010   Dr.Hung  . ENDOVASCULAR STENT INSERTION N/A 07/01/2017   Procedure: ENDOVASCULAR STENT GRAFT INSERTION;  Surgeon: Serafina Mitchell, MD;  Location: [facility] OR;  Service: Vascular;  Laterality: N/A;  . THROMBECTOMY FEMORAL ARTERY Bilateral 07/01/2017   Procedure: Bilateral IlioFemoral Thrombectomies;  Surgeon: Serafina Mitchell, MD;  Location: [facility] OR;  Service: Vascular;  Laterality: Bilateral;    Family History  Family history unknown: Yes    Social History   Socioeconomic History  . Marital status: Divorced    Spouse name: Not on file  . Number of children: Not on file  . Years of education: Not on file  .  Highest education level: Not on file  Occupational History  . Not on file  Tobacco Use  . Smoking status: Former Smoker    Types: Cigarettes    Quit date: 07/16/2017    Years since quitting: 2.7  . Smokeless tobacco: Never Used  Vaping Use  . Vaping Use: Never used  Substance and Sexual Activity  . Alcohol use:  No  . Drug use: No  . Sexual activity: Not Currently  Other Topics Concern  . Not on file  Social History Narrative  . Not on file   Social Determinants of Health   Financial Resource Strain: Not on file  Food Insecurity: Not on file  Transportation Needs: Not on file  Physical Activity: Not on file  Stress: Not on file  Social Connections: Not on file     Observations/Objective: Awake, alert and oriented x 3   Review of Systems  Constitutional: Negative for fever, malaise/fatigue and weight loss.  HENT: Negative.  Negative for nosebleeds.   Eyes: Negative.  Negative for blurred vision, double vision and photophobia.  Respiratory: Negative.  Negative for cough and shortness of breath.   Cardiovascular: Negative.  Negative for chest pain, palpitations and leg swelling.  Gastrointestinal: Negative.  Negative for heartburn, nausea and vomiting.  Musculoskeletal: Negative.  Negative for myalgias.  Neurological: Negative.  Negative for dizziness, focal weakness, seizures and headaches.  Psychiatric/Behavioral: Negative.  Negative for suicidal ideas.    Assessment and Plan: Thomas [name] was seen today for follow-up.  Diagnoses and all orders for this visit:  Essential hypertension Continue all antihypertensives as prescribed.  Remember to bring in your blood pressure log with you for your follow up appointment.  DASH/Mediterranean Diets are healthier choices for HTN.    Prediabetes Continue blood sugar control as discussed in office today, low carbohydrate diet, and regular physical exercise as tolerated, 150 minutes per week (30 min each day, 5 days per week, or 50 min 3 days per week).    Follow Up Instructions Return in about 3 months (around 07/28/2020).     I discussed the assessment and treatment plan with the patient. The patient was provided an opportunity to ask questions and all were answered. The patient agreed with the plan and demonstrated an understanding of the  instructions.   The patient was advised to call back or seek an in-person evaluation if the symptoms worsen or if the condition fails to improve as anticipated.  I provided 12 minutes of non-face-to-face time during this encounter including median intraservice time, reviewing previous notes, labs, imaging, medications and explaining diagnosis and management.  Thomas Pounds, FNP-BC

## 2020-05-14 ENCOUNTER — Telehealth: Payer: Self-pay

## 2020-05-14 NOTE — Telephone Encounter (Signed)
Spoke with pts daughter Jahel Wavra. Pt wants to cancel his fem-fem BPG surgery on 05/19/20 because "he's not feeling well- more so worried". Stated, he will call back when ready to reschedule.

## 2020-05-19 ENCOUNTER — Inpatient Hospital Stay (HOSPITAL_COMMUNITY): Admission: RE | Admit: 2020-05-19 | Payer: PPO | Source: Home / Self Care | Admitting: Surgery

## 2020-05-19 ENCOUNTER — Encounter (HOSPITAL_COMMUNITY): Admission: RE | Payer: Self-pay | Source: Home / Self Care

## 2020-05-19 SURGERY — BYPASS GRAFT FEMORAL-POPLITEAL ARTERY
Anesthesia: General | Laterality: Bilateral

## 2020-07-19 ENCOUNTER — Ambulatory Visit: Payer: PPO | Admitting: Nurse Practitioner

## 2020-07-24 ENCOUNTER — Other Ambulatory Visit: Payer: Self-pay | Admitting: Nurse Practitioner

## 2020-07-24 ENCOUNTER — Other Ambulatory Visit: Payer: Self-pay | Admitting: Family Medicine

## 2020-07-24 ENCOUNTER — Other Ambulatory Visit: Payer: Self-pay | Admitting: Internal Medicine

## 2020-07-24 DIAGNOSIS — I10 Essential (primary) hypertension: Secondary | ICD-10-CM

## 2020-07-24 NOTE — Telephone Encounter (Signed)
Requested Prescriptions  Pending Prescriptions Disp Refills  . amLODipine (NORVASC) 5 MG tablet [Pharmacy Med Name: AMLODIPINE BESYLATE 5MG  TABLETS] 90 tablet 1    Sig: TAKE 1 TABLET(5 MG) BY MOUTH DAILY     Cardiovascular:  Calcium Channel Blockers Passed - 07/24/2020 10:05 AM      Passed - Last BP in normal range    BP Readings from Last 1 Encounters:  04/19/20 97/70         Passed - Valid encounter within last 6 months    Recent Outpatient Visits          2 months ago Essential hypertension   [place], Vernia Buff, NP   5 months ago Essential hypertension   [place], Vernia Buff, NP   9 months ago Essential hypertension   [place], Vernia Buff, NP   1 year ago Essential hypertension   [place], Vernia Buff, NP   1 year ago Essential hypertension   [place], RPH-CPP      Future Appointments            In 1 month Gildardo Pounds, NP [place]

## 2020-07-24 NOTE — Telephone Encounter (Signed)
Requested medication (s) are due for refill today: yes  Requested medication (s) are on the active medication list: yes  Last refill:  01/23/20 #90 1 RF  Future visit scheduled: yes  Notes to clinic:  no refill protocol for this order   Requested Prescriptions  Pending Prescriptions Disp Refills   atorvastatin (LIPITOR) 20 MG tablet [Pharmacy Med Name: ATORVASTATIN 20MG  TABLETS] 90 tablet 1    Sig: TAKE 1 TABLET(20 MG) BY MOUTH DAILY AT 6 PM      There is no refill protocol information for this order

## 2020-07-26 ENCOUNTER — Other Ambulatory Visit: Payer: Self-pay | Admitting: Nurse Practitioner

## 2020-07-26 DIAGNOSIS — K219 Gastro-esophageal reflux disease without esophagitis: Secondary | ICD-10-CM

## 2020-07-26 NOTE — Telephone Encounter (Signed)
Requested Prescriptions  Pending Prescriptions Disp Refills  . omeprazole (PRILOSEC) 20 MG capsule [Pharmacy Med Name: OMEPRAZOLE 20MG  CAPSULES] 90 capsule 0    Sig: TAKE 1 CAPSULE(20 MG) BY MOUTH DAILY     Gastroenterology: Proton Pump Inhibitors Passed - 07/26/2020  5:10 PM      Passed - Valid encounter within last 12 months    Recent Outpatient Visits          2 months ago Essential hypertension   [place], Vernia Buff, NP   6 months ago Essential hypertension   [place], Vernia Buff, NP   9 months ago Essential hypertension   [place], Vernia Buff, NP   1 year ago Essential hypertension   [place], Vernia Buff, NP   1 year ago Essential hypertension   [place], RPH-CPP      Future Appointments            In 1 month Gildardo Pounds, NP [place]

## 2020-08-24 ENCOUNTER — Other Ambulatory Visit: Payer: Self-pay | Admitting: Pharmacist

## 2020-08-24 DIAGNOSIS — I10 Essential (primary) hypertension: Secondary | ICD-10-CM

## 2020-08-24 MED ORDER — LOSARTAN POTASSIUM 50 MG PO TABS: 1.0000 | ORAL_TABLET | Freq: Every day | ORAL | 0 refills | Status: DC

## 2020-09-07 ENCOUNTER — Ambulatory Visit: Payer: PPO | Admitting: Nurse Practitioner

## 2020-10-24 ENCOUNTER — Other Ambulatory Visit: Payer: Self-pay | Admitting: Nurse Practitioner

## 2020-10-24 DIAGNOSIS — K219 Gastro-esophageal reflux disease without esophagitis: Secondary | ICD-10-CM

## 2020-10-24 NOTE — Telephone Encounter (Signed)
Requested Prescriptions  Pending Prescriptions Disp Refills  . omeprazole (PRILOSEC) 20 MG capsule [Pharmacy Med Name: OMEPRAZOLE '20MG'$  CAPSULES] 90 capsule 0    Sig: TAKE 1 CAPSULE(20 MG) BY MOUTH DAILY     Gastroenterology: Proton Pump Inhibitors Passed - 10/24/2020  5:02 PM      Passed - Valid encounter within last 12 months    Recent Outpatient Visits          5 months ago Essential hypertension   [place], Vernia Buff, NP   9 months ago Essential hypertension   [place], Vernia Buff, NP   1 year ago Essential hypertension   [place], Vernia Buff, NP   1 year ago Essential hypertension   [place], Vernia Buff, NP   1 year ago Essential hypertension   [place], RPH-CPP      Future Appointments            In 1 week Gildardo Pounds, NP [place]

## 2020-11-01 ENCOUNTER — Other Ambulatory Visit: Payer: Self-pay | Admitting: Family Medicine

## 2020-11-01 ENCOUNTER — Ambulatory Visit: Payer: PPO | Admitting: Nurse Practitioner

## 2020-11-04 ENCOUNTER — Other Ambulatory Visit: Payer: Self-pay | Admitting: Thoracic Surgery (Cardiothoracic Vascular Surgery)

## 2020-11-04 DIAGNOSIS — I712 Thoracic aortic aneurysm, without rupture, unspecified: Secondary | ICD-10-CM

## 2020-11-22 ENCOUNTER — Other Ambulatory Visit: Payer: Self-pay | Admitting: Family Medicine

## 2020-11-22 DIAGNOSIS — I10 Essential (primary) hypertension: Secondary | ICD-10-CM

## 2020-11-22 NOTE — Telephone Encounter (Signed)
Requested medication (s) are due for refill today: yes  Requested medication (s) are on the active medication list: yes  Last refill:  08/24/20  Future visit scheduled: yes  Notes to clinic:  "Must have office visit for more refills" Overdue labs   Requested Prescriptions  Pending Prescriptions Disp Refills   losartan (COZAAR) 50 MG tablet [Pharmacy Med Name: LOSARTAN '50MG'$  TABLETS] 90 tablet 0    Sig: TAKE 1 TABLET(50 MG) BY MOUTH DAILY. PLEASE MAKE PCP APPOINTMENT     Cardiovascular:  Angiotensin Receptor Blockers Failed - 11/22/2020 11:16 AM      Failed - Cr in normal range and within 180 days    Creatinine, Ser  Date Value Ref Range Status  04/19/2020 1.76 (H) 0.61 - 1.24 mg/dL Final   Creatinine, Urine  Date Value Ref Range Status  06/28/2017 100.46 mg/dL Final    Comment:    Performed at [facility], [place] [address] [place], [place] [address]          Failed - K in normal range and within 180 days    Potassium  Date Value Ref Range Status  04/19/2020 4.2 3.5 - 5.1 mmol/L Final          Failed - Valid encounter within last 6 months    Recent Outpatient Visits           6 months ago Essential hypertension   [place], Vernia Buff, NP   9 months ago Essential hypertension   [place], Vernia Buff, NP   1 year ago Essential hypertension   [place], Zelda W, NP   1 year ago Essential hypertension   [place], Vernia Buff, NP   2 years ago Essential hypertension   [place], RPH-CPP       Future Appointments             In 4 weeks Gildardo Pounds, NP [place] - Patient is not pregnant      Passed - Last BP in normal range    BP Readings from Last 1 Encounters:  04/19/20 97/70

## 2020-11-26 ENCOUNTER — Other Ambulatory Visit: Payer: Self-pay | Admitting: Family Medicine

## 2020-11-26 DIAGNOSIS — I10 Essential (primary) hypertension: Secondary | ICD-10-CM

## 2020-11-26 NOTE — Telephone Encounter (Signed)
Thomas [name] called and spoke to [place] about the refill(s) Losartan requested. Advised it was sent on 11/25/20 #30/0 refill(s). He says the insurance will only pay for 90 days so that's why the request was sent. I advised on the Rx it says needs appointment. He says he will refill for that amount. Patient has upcoming appointment, but was no show for f/u indicated per last OV note.

## 2020-12-09 ENCOUNTER — Other Ambulatory Visit: Payer: PPO

## 2020-12-13 ENCOUNTER — Ambulatory Visit: Payer: PPO

## 2020-12-20 ENCOUNTER — Ambulatory Visit: Payer: PPO

## 2020-12-21 ENCOUNTER — Ambulatory Visit: Payer: PPO | Attending: Nurse Practitioner | Admitting: Nurse Practitioner

## 2020-12-21 ENCOUNTER — Encounter: Payer: Self-pay | Admitting: Nurse Practitioner

## 2020-12-21 ENCOUNTER — Other Ambulatory Visit: Payer: Self-pay

## 2020-12-21 VITALS — BP 120/79 | HR 57 | Ht 67.0 in | Wt 138.0 lb

## 2020-12-21 DIAGNOSIS — N1831 Chronic kidney disease, stage 3a: Secondary | ICD-10-CM | POA: Diagnosis not present

## 2020-12-21 DIAGNOSIS — I10 Essential (primary) hypertension: Secondary | ICD-10-CM | POA: Diagnosis not present

## 2020-12-21 DIAGNOSIS — R7303 Prediabetes: Secondary | ICD-10-CM

## 2020-12-21 DIAGNOSIS — E785 Hyperlipidemia, unspecified: Secondary | ICD-10-CM

## 2020-12-21 DIAGNOSIS — Z23 Encounter for immunization: Secondary | ICD-10-CM

## 2020-12-21 MED ORDER — AMLODIPINE BESYLATE 5 MG PO TABS: 5.0000 mg | ORAL_TABLET | Freq: Every day | ORAL | 1 refills | Status: DC

## 2020-12-21 MED ORDER — VALSARTAN 40 MG PO TABS: 40.0000 mg | ORAL_TABLET | Freq: Every day | ORAL | 1 refills | Status: DC

## 2020-12-21 NOTE — Progress Notes (Addendum)
Assessment & Plan:  Thomas [name] was seen today for hypertension.  Diagnoses and all orders for this visit:  Essential hypertension -     CMP14+EGFR -     amLODipine (NORVASC) 5 MG tablet; Take 1 tablet (5 mg total) by mouth daily. -     valsartan (DIOVAN) 40 MG tablet; Take 1 tablet (40 mg total) by mouth daily. Continue all antihypertensives as prescribed.  Remember to bring in your blood pressure log with you for your follow up appointment.  DASH/Mediterranean Diets are healthier choices for HTN.    Prediabetes -     CMP14+EGFR -     Hemoglobin A1c Continue blood sugar control as discussed in office today, low carbohydrate diet, and regular physical exercise as tolerated, 150 minutes per week (30 min each day, 5 days per week, or 50 min 3 days per week).   Dyslipidemia, goal LDL below 70 -     Lipid panel INSTRUCTIONS: Work on a low fat, heart healthy diet and participate in regular aerobic exercise program by working out at least 150 minutes per week; 5 days a week-30 minutes per day. Avoid red meat/beef/steak,  fried foods. junk foods, sodas, sugary drinks, unhealthy snacking, alcohol and smoking.  Drink at least 80 oz of water per day and monitor your carbohydrate intake daily.    Stage 3a chronic kidney disease (HCC) -     CBC  Need for immunization against influenza -     Flu Vaccine QUAD 59moIM (Fluarix, Fluzone & Alfiuria Quad PF)   Patient has been counseled on age-appropriate routine health concerns for screening and prevention. These are reviewed and up-to-date. Referrals have been placed accordingly. Immunizations are up-to-date or declined.    Subjective:   Chief Complaint  Patient presents with   Hypertension   HPI Thomas NFonder89y.o. male presents to office today for HTN. Accompanied by his daughter.   He has a past medical history of Chronic kidney disease, GERD, History of kidney stones, Hyperlipidemia, Hypertension, Occlusion of right middle cerebral artery, PVD,  Ruptured abdominal aortic aneurysm, Stroke (06/2017), and Thoracic aortic aneurysm.  BP Readings from Last 3 Encounters:  12/21/20 120/79  04/19/20 97/70  03/15/20 125/81    Had 1 fall last week. He did not lose consciousness.States he was putting on his clothes and all of a sudden felt heat in his stomach area and then he fell. Needs orthostatic BP         Review of Systems  Constitutional:  Negative for fever, malaise/fatigue and weight loss.  HENT: Negative.  Negative for nosebleeds.   Eyes: Negative.  Negative for blurred vision, double vision and photophobia.  Respiratory: Negative.  Negative for cough and shortness of breath.   Cardiovascular: Negative.  Negative for chest pain, palpitations and leg swelling.  Gastrointestinal: Negative.  Negative for heartburn, nausea and vomiting.  Musculoskeletal: Negative.  Negative for myalgias.  Neurological: Negative.  Negative for dizziness, focal weakness, seizures and headaches.  Psychiatric/Behavioral: Negative.  Negative for suicidal ideas.    Past Medical History:  Diagnosis Date   Chronic kidney disease    GERD (gastroesophageal reflux disease)    History of kidney stones    Hyperlipidemia    Hypertension    Occlusion of right middle cerebral artery    [place]ipheral vascular disease) [place]    Rupt[place] abdominal [place] (AAA) [place]    [place] 06[place]   Thoracic aortic aneurysm (TAA) (HCC)     Past Surgical History:  Procedure Laterality Date   CHOLECYSTECTOMY     COLONOSCOPY  2010   Dr.Hung   ENDOVASCULAR STENT INSERTION N/A 07/01/2017   Procedure: ENDOVASCULAR STENT GRAFT INSERTION;  Surgeon: Serafina Mitchell, MD;  Location: [facility] OR;  Service: Vascular;  Laterality: N/A;   THROMBECTOMY FEMORAL ARTERY Bilateral 07/01/2017   Procedure: Bilateral IlioFemoral Thrombectomies;  Surgeon: Serafina Mitchell, MD;  Location: [facility] OR;  Service: Vascular;  Laterality: Bilateral;    Family History  Family history  unknown: Yes    Social History Reviewed with no changes to be made today.   Outpatient Medications Prior to Visit  Medication Sig Dispense Refill   atorvastatin (LIPITOR) 20 MG tablet TAKE 1 TABLET(20 MG) BY MOUTH DAILY AT 6 PM 90 tablet 0   omeprazole (PRILOSEC) 20 MG capsule TAKE 1 CAPSULE(20 MG) BY MOUTH DAILY 90 capsule 0   amLODipine (NORVASC) 5 MG tablet TAKE 1 TABLET(5 MG) BY MOUTH DAILY 90 tablet 1   losartan (COZAAR) 50 MG tablet TAKE 1 TABLET(50 MG) BY MOUTH DAILY. PLEASE MAKE PCP APPOINTMENT 30 tablet 0   No facility-administered medications prior to visit.    No Known Allergies     Objective:    BP 120/79   Pulse (!) 57   Ht '5\' 7"'  (1.702 m)   Wt 138 lb (62.6 kg)   SpO2 99%   BMI 21.61 kg/m  Wt Readings from Last 3 Encounters:  12/21/20 138 lb (62.6 kg)  04/19/20 142 lb 1.6 oz (64.5 kg)  03/15/20 146 lb (66.2 kg)    Physical Exam Vitals and nursing note reviewed.  Constitutional:      Appearance: He is well-developed.  HENT:     Head: Normocephalic and atraumatic.  Cardiovascular:     Rate and Rhythm: Normal rate and regular rhythm.     Heart sounds: Normal heart sounds. No murmur heard.   No friction rub. No gallop.  Pulmonary:     Effort: Pulmonary effort is normal. No tachypnea or respiratory distress.     Breath sounds: Normal breath sounds. No decreased breath sounds, wheezing, rhonchi or rales.  Chest:     Chest wall: No tenderness.  Abdominal:     General: Bowel sounds are normal.     Palpations: Abdomen is soft.  Musculoskeletal:        General: Normal range of motion.     Cervical back: Normal range of motion.  Skin:    General: Skin is warm and dry.  Neurological:     Mental Status: He is alert and oriented to person, place, and time.     Coordination: Coordination normal.     Comments: Using a straight cane to assist with mobility  Psychiatric:        Behavior: Behavior normal. Behavior is cooperative.        Thought Content: Thought  content normal.        Judgment: Judgment normal.         Patient has been counseled extensively about nutrition and exercise as well as the importance of adherence with medications and regular follow-up. The patient was given clear instructions to go to ER or return to medical center if symptoms don't improve, worsen or new problems develop. The patient verbalized understanding.   Follow-up: Return in about 3 months (around 03/22/2021).   Gildardo Pounds, FNP-BC [facility] and [place], [place]   12/21/2020, 12:59 PM

## 2020-12-22 LAB — CBC
Hematocrit: 44.4 % (ref 37.5–51.0)
Hemoglobin: 14.3 g/dL (ref 13.0–17.7)
MCH: 25.2 pg — ABNORMAL LOW (ref 26.6–33.0)
MCHC: 32.2 g/dL (ref 31.5–35.7)
MCV: 78 fL — ABNORMAL LOW (ref 79–97)
Platelets: 316 10*3/uL (ref 150–450)
RBC: 5.68 x10E6/uL (ref 4.14–5.80)
RDW: 14.6 % (ref 11.6–15.4)
WBC: 13.3 10*3/uL — ABNORMAL HIGH (ref 3.4–10.8)

## 2020-12-22 LAB — CMP14+EGFR
ALT: 17 IU/L (ref 0–44)
AST: 22 IU/L (ref 0–40)
Albumin/Globulin Ratio: 1.3 (ref 1.2–2.2)
Albumin: 4.5 g/dL (ref 3.8–4.8)
Alkaline Phosphatase: 130 IU/L — ABNORMAL HIGH (ref 44–121)
BUN/Creatinine Ratio: 11 (ref 10–24)
BUN: 19 mg/dL (ref 8–27)
Bilirubin Total: 0.5 mg/dL (ref 0.0–1.2)
CO2: 22 mmol/L (ref 20–29)
Calcium: 9.5 mg/dL (ref 8.6–10.2)
Chloride: 99 mmol/L (ref 96–106)
Creatinine, Ser: 1.66 mg/dL — ABNORMAL HIGH (ref 0.76–1.27)
Globulin, Total: 3.6 g/dL (ref 1.5–4.5)
Glucose: 88 mg/dL (ref 70–99)
Potassium: 4.7 mmol/L (ref 3.5–5.2)
Sodium: 137 mmol/L (ref 134–144)
Total Protein: 8.1 g/dL (ref 6.0–8.5)
eGFR: 45 mL/min/{1.73_m2} — ABNORMAL LOW (ref >59)

## 2020-12-22 LAB — LIPID PANEL
Chol/HDL Ratio: 2.9 ratio (ref 0.0–5.0)
Cholesterol, Total: 139 mg/dL (ref 100–199)
HDL: 48 mg/dL (ref >39)
LDL Chol Calc (NIH): 76 mg/dL (ref 0–99)
Triglycerides: 75 mg/dL (ref 0–149)
VLDL Cholesterol Cal: 15 mg/dL (ref 5–40)

## 2020-12-22 LAB — HEMOGLOBIN A1C
Est. average glucose Bld gHb Est-mCnc: 123 mg/dL
Hgb A1c MFr Bld: 5.9 % — ABNORMAL HIGH (ref 4.8–5.6)

## 2020-12-26 ENCOUNTER — Other Ambulatory Visit: Payer: Self-pay | Admitting: Nurse Practitioner

## 2020-12-26 DIAGNOSIS — D72829 Elevated white blood cell count, unspecified: Secondary | ICD-10-CM

## 2020-12-27 ENCOUNTER — Telehealth: Payer: Self-pay

## 2020-12-27 ENCOUNTER — Telehealth: Payer: Self-pay | Admitting: Hematology and Oncology

## 2020-12-27 NOTE — Telephone Encounter (Signed)
-----   Message from Gildardo Pounds, NP sent at 12/26/2020  8:19 AM EDT ----- Pls call patient's daughter. White blood count continues elevated. Will need to repeat your labs next week. You can stop by the office anytime for this. I referred you to the blood specialist a few years ago. It looks like they were not able to contact you. Please make sure you schedule when they call this time around.

## 2020-12-27 NOTE — Telephone Encounter (Signed)
Scheduled appt per 10/3 referral. Spoke to pt's daughter who is aware of appt date and time. She is also aware to arrive to appt 15 mins early.

## 2021-01-03 ENCOUNTER — Inpatient Hospital Stay: Payer: PPO | Attending: Hematology and Oncology | Admitting: Hematology and Oncology

## 2021-01-03 ENCOUNTER — Other Ambulatory Visit: Payer: Self-pay

## 2021-01-03 ENCOUNTER — Inpatient Hospital Stay: Payer: PPO

## 2021-01-03 VITALS — BP 121/82 | HR 72 | Temp 97.9°F | Resp 18 | Wt 137.3 lb

## 2021-01-03 DIAGNOSIS — I129 Hypertensive chronic kidney disease with stage 1 through stage 4 chronic kidney disease, or unspecified chronic kidney disease: Secondary | ICD-10-CM | POA: Insufficient documentation

## 2021-01-03 DIAGNOSIS — D72820 Lymphocytosis (symptomatic): Secondary | ICD-10-CM

## 2021-01-03 DIAGNOSIS — N189 Chronic kidney disease, unspecified: Secondary | ICD-10-CM | POA: Diagnosis not present

## 2021-01-03 DIAGNOSIS — Z87891 Personal history of nicotine dependence: Secondary | ICD-10-CM | POA: Diagnosis not present

## 2021-01-03 LAB — CBC WITH DIFFERENTIAL (CANCER CENTER ONLY)
Abs Immature Granulocytes: 0.02 10*3/uL (ref 0.00–0.07)
Basophils Absolute: 0.1 10*3/uL (ref 0.0–0.1)
Basophils Relative: 1 %
Eosinophils Absolute: 3.3 10*3/uL — ABNORMAL HIGH (ref 0.0–0.5)
Eosinophils Relative: 27 %
HCT: 43.7 % (ref 39.0–52.0)
Hemoglobin: 14.3 g/dL (ref 13.0–17.0)
Immature Granulocytes: 0 %
Lymphocytes Relative: 32 %
Lymphs Abs: 3.9 10*3/uL (ref 0.7–4.0)
MCH: 26 pg (ref 26.0–34.0)
MCHC: 32.7 g/dL (ref 30.0–36.0)
MCV: 79.6 fL — ABNORMAL LOW (ref 80.0–100.0)
Monocytes Absolute: 0.8 10*3/uL (ref 0.1–1.0)
Monocytes Relative: 7 %
Neutro Abs: 4.1 10*3/uL (ref 1.7–7.7)
Neutrophils Relative %: 33 %
Platelet Count: 318 10*3/uL (ref 150–400)
RBC: 5.49 MIL/uL (ref 4.22–5.81)
RDW: 15.9 % — ABNORMAL HIGH (ref 11.5–15.5)
WBC Count: 12.2 10*3/uL — ABNORMAL HIGH (ref 4.0–10.5)
nRBC: 0 % (ref 0.0–0.2)

## 2021-01-03 LAB — SEDIMENTATION RATE: Sed Rate: 15 mm/hr (ref 0–16)

## 2021-01-03 NOTE — Progress Notes (Signed)
[place] [phone]   Fax:([phone]) [place] NOTE  Patient Care Team: Gildardo Pounds, NP as PCP - General (Nurse Practitioner) Sanda Klein, MD as PCP - Cardiology (Cardiology)  Hematological/Oncological History # Leukocytosis/Lymphocytosis 11/19/2019: WBC 11.8, Hgb 14.3, MCV 80, Plt 327 01/28/2020: WBC 11.6, Hgb 14.6, MCV 81, Plt 371 12/21/2020: WBC 13.3, Hgb 14.3, MCV 78, Plt 316 01/03/2021: establish care with Dr. Lorenso Courier. WBC 12.2, Hgb 14.3, MCV 79.6, Plt 318   CHIEF COMPLAINTS/PURPOSE OF CONSULTATION:  "Leukocytosis/Lymphocytosis "  HISTORY OF PRESENTING ILLNESS:  Thomas [name] 65 y.o. male with medical history significant for CKD, hyperlipidemia, hypertension, and stroke who presents for evaluation of leukocytosis.  On review of the previous records [name] has a longstanding history of leukocytosis dating back to at least 02/27/2017.  Leukocytosis typically maintains a range of approximately 11-12.  He has not had any other associated hematological abnormalities as he has a normal hemoglobin, MCV, and platelet count.  Most recently on 12/22/2018 the patient was found to have low blood cell count 13.3, hemoglobin 14.3, MCV of 78, and a platelet count of 316.  Leukocytosis has varied in predominance but has had elevations in neutrophils, monocytes, and occasionally eosinophils.  Due to concern for these findings the patient was referred to hematology for further evaluation management.  On exam today [name] is accompanied by his daughter.  He notes he has had no issues with recurrent infections.  He reports that he sometimes has difficulties with his teeth for which he rinses with salt water.  He notes he does not currently have any active infection.  He denies any fevers, chills, sweats.  He reports that his weight is "up-and-down".  And his appetite is not very good.  He denies any enlarged lymph nodes but does occasionally have some knee pain.   He denies any rash.  On further discussion he is a former smoker having quit 3 years ago after his stroke.  He does not drink any alcohol.  He previously worked in [employer] here at [employer].  He notes that his family life, did not did not go to the doctor and therefore he is unaware of any medical conditions that they might have.  He does not endorse having some occasional episodes of "dizziness" and "emptiness in the head".  He notes that he is not currently having any other symptoms.  Full 10 point ROS is listed below.  MEDICAL HISTORY:  Past Medical History:  Diagnosis Date   Chronic kidney disease    GERD (gastroesophageal reflux disease)    History of kidney stones    Hyperlipidemia    Hypertension    Occlusion of right middle cerebral artery    PVD (peripheral vascular disease) ([place])    Ruptured abdominal aortic aneurysm (AAA) ([place])    Stroke ([place]) 06/2017   Thoracic aortic aneurysm (TAA) ([place])     SURGICAL HISTORY: Past Surgical History:  Procedure Laterality Date   CHOLECYSTECTOMY     COLONOSCOPY  2010   Dr.Hung   ENDOVASCULAR STENT INSERTION N/A 07/01/2017   Procedure: ENDOVASCULAR STENT GRAFT INSERTION;  Surgeon: Serafina Mitchell, MD;  Location: [facility] OR;  Service: Vascular;  Laterality: N/A;   THROMBECTOMY FEMORAL ARTERY Bilateral 07/01/2017   Procedure: Bilateral IlioFemoral Thrombectomies;  Surgeon: Serafina Mitchell, MD;  Location: [facility] OR;  Service: Vascular;  Laterality: Bilateral;    SOCIAL HISTORY: Social History   Socioeconomic History   Marital status: Divorced  Spouse name: Not on file   Number of children: Not on file   Years of education: Not on file   Highest education level: Not on file  Occupational History   Not on file  Tobacco Use   Smoking status: Former    Types: Cigarettes    Quit date: 07/16/2017    Years since quitting: 3.4   Smokeless tobacco: Never  Vaping Use   Vaping Use: Never used  Substance and Sexual Activity   Alcohol use: No    Drug use: No   Sexual activity: Not Currently  Other Topics Concern   Not on file  Social History Narrative   Not on file   Social Determinants of Health   Financial Resource Strain: Not on file  Food Insecurity: Not on file  Transportation Needs: Not on file  Physical Activity: Not on file  Stress: Not on file  Social Connections: Not on file  Intimate Partner Violence: Not on file    FAMILY HISTORY: Family History  Family history unknown: Yes    ALLERGIES:  has No Known Allergies.  MEDICATIONS:  Current Outpatient Medications  Medication Sig Dispense Refill   amLODipine (NORVASC) 5 MG tablet Take 1 tablet (5 mg total) by mouth daily. 90 tablet 1   atorvastatin (LIPITOR) 20 MG tablet TAKE 1 TABLET(20 MG) BY MOUTH DAILY AT 6 PM 90 tablet 0   omeprazole (PRILOSEC) 20 MG capsule TAKE 1 CAPSULE(20 MG) BY MOUTH DAILY 90 capsule 0   valsartan (DIOVAN) 40 MG tablet Take 1 tablet (40 mg total) by mouth daily. 90 tablet 1   No current facility-administered medications for this visit.    REVIEW OF SYSTEMS:   Constitutional: ( - ) fevers, ( - )  chills , ( - ) night sweats Eyes: ( - ) blurriness of vision, ( - ) double vision, ( - ) watery eyes Ears, nose, mouth, throat, and face: ( - ) mucositis, ( - ) sore throat Respiratory: ( - ) cough, ( - ) dyspnea, ( - ) wheezes Cardiovascular: ( - ) palpitation, ( - ) chest discomfort, ( - ) lower extremity swelling Gastrointestinal:  ( - ) nausea, ( - ) heartburn, ( - ) change in bowel habits Skin: ( - ) abnormal skin rashes Lymphatics: ( - ) new lymphadenopathy, ( - ) easy bruising Neurological: ( - ) numbness, ( - ) tingling, ( - ) new weaknesses Behavioral/Psych: ( - ) mood change, ( - ) new changes  All other systems were reviewed with the patient and are negative.  PHYSICAL EXAMINATION: ECOG PERFORMANCE STATUS: 1 - Symptomatic but completely ambulatory  Vitals:   01/03/21 1419  BP: 121/82  Pulse: 72  Resp: 18  Temp: 97.9  F (36.6 C)  SpO2: 98%   Filed Weights   01/03/21 1419  Weight: 137 lb 5 oz (62.3 kg)    GENERAL: well appearing elderly Asian American male in NAD  SKIN: skin color, texture, turgor are normal, no rashes or significant lesions EYES: conjunctiva are pink and non-injected, sclera clear LUNGS: clear to auscultation and percussion with normal breathing effort HEART: regular rate & rhythm and no murmurs and no lower extremity edema Musculoskeletal: no cyanosis of digits and no clubbing  PSYCH: alert & oriented x 3, fluent speech NEURO: no focal motor/sensory deficits  LABORATORY DATA:  I have reviewed the data as listed CBC Latest Ref Rng & Units 12/21/2020 04/19/2020 01/28/2020  WBC 3.4 - 10.8 x10E3/uL 13.3(H) 10.0 11.6(H)  Hemoglobin 13.0 - 17.7 g/dL 14.3 15.4 14.6  Hematocrit 37.5 - 51.0 % 44.4 47.4 45.2  Platelets 150 - 450 x10E3/uL 316 320 371    CMP Latest Ref Rng & Units 12/21/2020 04/19/2020 01/28/2020  Glucose 70 - 99 mg/dL 88 129(H) 76  BUN 8 - 27 mg/dL _0 Creatinine 0.76 - 1.27 mg/dL 1.66(H) 1.76(H) 1.65(H)  Sodium 134 - 144 mmol/L 137 132(L) 141  Potassium 3.5 - 5.2 mmol/L 4.7 4.2 5.2  Chloride 96 - 106 mmol/L 99 103 104  CO2 20 - 29 mmol/L 22 17(L) 24  Calcium 8.6 - 10.2 mg/dL 9.5 9.2 9.9  Total Protein 6.0 - 8.5 g/dL 8.1 8.5(H) 8.3  Total Bilirubin 0.0 - 1.2 mg/dL 0.5 0.8 0.4  Alkaline Phos 44 - 121 IU/L 130(H) 93 117  AST 0 - 40 IU/L _1 ALT 0 - 44 IU/L _2 RADIOGRAPHIC STUDIES: No results found.  ASSESSMENT & PLAN Brainard Lamison 65 y.o. male with medical history significant for CKD, hyperlipidemia, hypertension, and stroke who presents for evaluation of leukocytosis.  After review of the labs, review of the records, and discussion with the patient the patients findings are most consistent with leukocytosis of unclear etiology.  Throughout his history he has maintained a leukocytosis but always appears to have a different differential.  He is  leukocytosis has been neutrophilic predominant, lymphocyte predominant, as well as eosinophilic.  This would tend to favor inflammatory condition, though the patient does not have any signs or symptoms concerning for inflammation.  Additionally his leukocytosis has been quite mild.  We can start with an inflammatory work-up and if this is found to be negative we will pursue an MPN evaluation.  #Leukocytosis -- Strangely the predominance of this leukocytosis has varied between neutrophilic, lymphocytic, and eosinophilic.  This would support an inflammatory cause --We will order inflammatory markers with CRP and ESR --We will order CBC with differential today and order flow cytometry given his last differential shows lymphocytosis --If no clear etiology can be found on today's work-up will need to consider MPN work-up --Return to clinic pending the results of the above work-up.  Orders Placed This Encounter  Procedures   CBC with Differential ([facility] Only)    Standing Status:   Future    Standing Expiration Date:   01/03/2022   CMP ([place] only)    Standing Status:   Future    Standing Expiration Date:   01/03/2022   Lactate dehydrogenase (LDH)    Standing Status:   Future    Standing Expiration Date:   01/03/2022   Flow Cytometry, Peripheral Blood (Oncology)    Standing Status:   Future    Standing Expiration Date:   01/03/2022   Sedimentation rate    Standing Status:   Future    Standing Expiration Date:   01/03/2022   C-reactive protein    Standing Status:   Future    Standing Expiration Date:   01/03/2022    All questions were answered. The patient knows to call the clinic with any problems, questions or concerns.  A total of more than 60 minutes were spent on this encounter with face-to-face time and non-face-to-face time, including preparing to see the patient, ordering tests and/or medications, counseling the patient and coordination of care as outlined above.    Ledell Peoples, MD Department of Hematology/Oncology [place] at [facility] Phone: [phone] Pager: [phone] Email:  [email or web address]  01/03/2021 2:39 PM

## 2021-01-04 ENCOUNTER — Encounter: Payer: Self-pay | Admitting: Hematology and Oncology

## 2021-01-05 LAB — SURGICAL PATHOLOGY

## 2021-01-06 LAB — FLOW CYTOMETRY

## 2021-01-07 ENCOUNTER — Ambulatory Visit
Admission: RE | Admit: 2021-01-07 | Discharge: 2021-01-07 | Disposition: A | Discharge status: Home / Self Care | Payer: PPO | Source: Ambulatory Visit | Attending: Thoracic Surgery (Cardiothoracic Vascular Surgery) | Admitting: Thoracic Surgery (Cardiothoracic Vascular Surgery)

## 2021-01-07 ENCOUNTER — Other Ambulatory Visit: Payer: Self-pay

## 2021-01-07 DIAGNOSIS — I700 Atherosclerosis of aorta: Secondary | ICD-10-CM | POA: Diagnosis not present

## 2021-01-07 DIAGNOSIS — J439 Emphysema, unspecified: Secondary | ICD-10-CM | POA: Diagnosis not present

## 2021-01-07 DIAGNOSIS — I712 Thoracic aortic aneurysm, without rupture, unspecified: Secondary | ICD-10-CM | POA: Diagnosis not present

## 2021-01-10 ENCOUNTER — Other Ambulatory Visit: Payer: Self-pay

## 2021-01-10 ENCOUNTER — Encounter: Payer: Self-pay | Admitting: Physician Assistant

## 2021-01-10 ENCOUNTER — Ambulatory Visit (INDEPENDENT_AMBULATORY_CARE_PROVIDER_SITE_OTHER): Payer: PPO | Admitting: Physician Assistant

## 2021-01-10 VITALS — BP 99/66 | HR 75 | Resp 20 | Ht 67.0 in | Wt 139.0 lb

## 2021-01-10 DIAGNOSIS — I712 Thoracic aortic aneurysm, without rupture, unspecified: Secondary | ICD-10-CM

## 2021-01-10 NOTE — Progress Notes (Deleted)
[address]       [place] [address]             [phone]     HPI: Thomas [name] returns for a scheduled follow-up  Yboum Gluth is a 65 year old man with a history of atherosclerotic cardiovascular disease including thoracic aortic atherosclerosis, thoracic aortic aneurysm, right MCA occlusion, stroke, repair of ruptured abdominal aortic aneurysm, PAD, chronic kidney disease, hypertension, hyperlipidemia, tobacco abuse (quit 2019), and interstitial lung disease on CT.  In April 2019 he was hospitalized with pneumococcal sepsis.  He had a ruptured 6.6 cm abdominal aneurysm which required emergent repair.  In May 2019 he presented with recurrent cerebellar infarcts.  CT of the chest showed a reported 4.5 cm aneurysm and he has been followed every 6 months since then.    Last year when he was seen he complains of feeling weak overall.  He also has some burning in his feet which has been attributed to peripheral neuropathy.  He recently had some ABIs checked which showed a ABI of 0.63 on the right and 0.9 on the left.  He quit smoking after his hospitalization in 2019 and has not smoked since then.  Today he returns to the office for routine annual surveillance for his thoracic ascending aortic aneurysm.  He has been having some dizziness intermittently.  He denies any pain or shortness of breath.   Past Medical History:  Diagnosis Date   Chronic kidney disease    Hyperlipidemia    Hypertension    Occlusion of right middle cerebral artery    PVD (peripheral vascular disease) ([facility])    Ruptured abdominal aortic aneurysm (AAA) ([place])    Stroke ([place]) 06/2017   Thoracic aortic aneurysm (TAA) ([facility])     Current Outpatient Medications on File Prior to Visit  Medication Sig Dispense Refill   amLODipine (NORVASC) 5 MG tablet Take 1 tablet (5 mg total) by mouth daily. 90 tablet 1   atorvastatin (LIPITOR) 20 MG tablet TAKE 1 TABLET(20 MG) BY MOUTH DAILY AT 6 PM 90 tablet 0    omeprazole (PRILOSEC) 20 MG capsule TAKE 1 CAPSULE(20 MG) BY MOUTH DAILY 90 capsule 0   valsartan (DIOVAN) 40 MG tablet Take 1 tablet (40 mg total) by mouth daily. 90 tablet 1   No current facility-administered medications on file prior to visit.      Physical Exam Vitals:   01/10/21 1018  BP: 99/66  Pulse: 75  Resp: 20  SpO46: 8%    65 year old man in no acute distress  Cor: RRR, no distress Neck: no JVD, no carotid bruit Pulm: CTA bilaterally and in all fields Abd: no tenderness Ext: no edema   Diagnostic Tests:  CLINICAL DATA:  Follow-up thoracic aortic aneurysm   EXAM: CT CHEST WITHOUT CONTRAST   TECHNIQUE: Multidetector CT imaging of the chest was performed following the standard protocol without IV contrast.   COMPARISON:  12/09/2019   FINDINGS: Cardiovascular: Aortic atherosclerosis. Unchanged enlargement of the tubular ascending thoracic aorta, measuring up 4.5 x 4.4 cm, unchanged when measured with similar technique. The descending thoracic aorta is normal in caliber measuring up to 2.8 x 2.8 cm. Normal heart size. Three-vessel coronary artery calcifications. No pericardial effusion.   Mediastinum/Nodes: No enlarged mediastinal, hilar, or axillary lymph nodes. Thyroid gland, trachea, and esophagus demonstrate no significant findings.   Lungs/Pleura: Moderate centrilobular emphysema. Bandlike scarring of bilateral lung bases, and possible mild pulmonary fibrosis in a pattern featuring irregular  peripheral interstitial opacity and septal thickening without clear apical to basal gradient. No pleural effusion or pneumothorax.   Upper Abdomen: No acute abnormality. Gallstones. Stable, definitively benign left adrenal adenoma.   Musculoskeletal: No chest wall mass or suspicious bone lesions identified.   IMPRESSION: 1. Unchanged enlargement of the tubular ascending thoracic aorta, measuring up to 4.5 x 4.4 cm, unchanged when measured with  similar technique. 2. Coronary artery disease. 3. Bandlike scarring of the bilateral lung bases, and possible mild underlying pulmonary fibrosis unchanged compared to prior examination. 4. Cholelithiasis.   Aortic Atherosclerosis (ICD10-I70.0).     Electronically Signed   By: Delanna Ahmadi M.D.   On: 01/07/2021 11:05 CT CHEST WITHOUT CONTRAST   TECHNIQUE: Multidetector CT imaging of the chest was performed following the standard protocol without IV contrast.   COMPARISON:  05/29/2019   FINDINGS: Cardiovascular: Measured in the same axial plane as comparison CT 05/29/2019, the ascending thoracic aorta measures 4.2 cm (image 73/3) compared to 4.2 cm.   Scattered atherosclerotic calcification of the aorta. Great vessels normal.   Coronary artery calcification and aortic atherosclerotic calcification. No axillary or supraclavicular adenopathy. No mediastinal or hilar adenopathy. No pericardial fluid. Esophagus normal.   Mediastinum/Nodes: No axillary or supraclavicular adenopathy. No mediastinal or hilar adenopathy. No pericardial fluid. Esophagus normal.   Lungs/Pleura:   Centrilobular emphysema the upper lobes. Mild bronchiectasis in lower lobes. Mild peripheral subpleural reticulation. These chronic lung parenchymal findings are unchanged from comparison exam.   Upper Abdomen: Several faceted gallstones.  No cholecystitis.   Nodule of the LEFT adrenal gland measures 13 mm and unchanged over multiple comparison exams back to 07/01/2017. Indeterminate lesion   Musculoskeletal: No acute osseous abnormality.   IMPRESSION: 1. No change in diameter of ascending thoracic aortic aneurysm at 4.2 cm. 2. Stable chronic interstitial lung disease. 3. Coronary artery calcification and Aortic Atherosclerosis (ICD10-I70.0). 4. Stable indeterminate LEFT adrenal nodule.     Electronically Signed   By: Suzy Bouchard M.D.   On: 12/09/2019 09:56 I personally reviewed the  CT images and concur with the findings noted above  Vitals:   01/10/21 1018  BP: 99/66  Pulse: 75  Resp: 20  SpO2: 98%    Impression:  Thomas [name] is a 65 year old Asian gentleman with a history of atherosclerotic cardiovascular disease including thoracic aortic atherosclerosis, thoracic aortic aneurysm, right MCA occlusion, stroke, repair of ruptured abdominal aortic aneurysm, PAD, chronic kidney disease, hypertension, hyperlipidemia, tobacco abuse (quit 2019), and interstitial lung disease on CT.  Thoracic aortic atherosclerosis/ascending aortic aneurysm-stable at 4.5 cm x 4.4 cm  No indication for intervention.  Blood pressure control is important.  He was encouraged to get away to take his blood pressure at home.  Hypertension-blood pressure well controlled currently.  At times he has had some dizziness.  His blood pressure is on the low side today in our office however, I do not have any other values to go off of.  He may have some orthostatic hypotension due to dehydration since he is only drinking about 1 to 2 cups of water a day.  I encouraged him to increase his water intake as tolerated.  PAD-has an ABI of 0.63 on the right and 0.9 on the left.  He was waiting for an appointment with vascular surgery.  He is a patient of Dr. Stephens Shire from a previous stent graft for ruptured AAA.  He was seen Dec 2021 by [facility] and was suppose to have another appointment this year. He said  he got COVID which has delayed his surgery. Now he is not sure if he wants it done because it isn't bothering him much.   Plan:  Return in 1 year with CT chest.  Will do without contrast   Nicholes Rough, PA-C [facility] [phone]

## 2021-01-10 NOTE — Progress Notes (Signed)
[address]       [place], [address]             [phone]      HPI: [name] returns for a scheduled follow-up   Thomas [name] is a 65 year old man with a history of atherosclerotic cardiovascular disease including thoracic aortic atherosclerosis, thoracic aortic aneurysm, right MCA occlusion, stroke, repair of ruptured abdominal aortic aneurysm, PAD, chronic kidney disease, hypertension, hyperlipidemia, tobacco abuse (quit 2019), and interstitial lung disease on CT.   In April 2019 he was hospitalized with pneumococcal sepsis.  He had a ruptured 6.6 cm abdominal aneurysm which required emergent repair.  In May 2019 he presented with recurrent cerebellar infarcts.  CT of the chest showed a reported 4.5 cm aneurysm and he has been followed every 6 months since then.     Last year when he was seen he complains of feeling weak overall.  He also has some burning in his feet which has been attributed to peripheral neuropathy.  He recently had some ABIs checked which showed a ABI of 0.63 on the right and 0.9 on the left.  He quit smoking after his hospitalization in 2019 and has not smoked since then.   Today he returns to the office for routine annual surveillance for his thoracic ascending aortic aneurysm.  He has been having some dizziness intermittently.  He denies any pain or shortness of breath.         Past Medical History:  Diagnosis Date   Chronic kidney disease     Hyperlipidemia     Hypertension     Occlusion of right middle cerebral artery     PVD (peripheral vascular disease) ([place])     Ruptured abdominal aortic aneurysm (AAA) ([place])     Stroke ([place]) 06/2017   Thoracic aortic aneurysm (TAA) ([facility])              Current Outpatient Medications on File Prior to Visit  Medication Sig Dispense Refill   amLODipine (NORVASC) 5 MG tablet Take 1 tablet (5 mg total) by mouth daily. 90 tablet 1   atorvastatin (LIPITOR) 20 MG tablet TAKE 1 TABLET(20 MG) BY MOUTH DAILY  AT 6 PM 90 tablet 0   omeprazole (PRILOSEC) 20 MG capsule TAKE 1 CAPSULE(20 MG) BY MOUTH DAILY 90 capsule 0   valsartan (DIOVAN) 40 MG tablet Take 1 tablet (40 mg total) by mouth daily. 90 tablet 1    No current facility-administered medications on file prior to visit.        Physical Exam    Vitals:    01/10/21 1018  BP: 99/66  Pulse: 75  Resp: 20  SpO78: 76%    65 year old man in no acute distress   Cor: RRR, no distress Neck: no JVD, no carotid bruit Pulm: CTA bilaterally and in all fields Abd: no tenderness Ext: no edema     Diagnostic Tests:   CLINICAL DATA:  Follow-up thoracic aortic aneurysm   EXAM: CT CHEST WITHOUT CONTRAST   TECHNIQUE: Multidetector CT imaging of the chest was performed following the standard protocol without IV contrast.   COMPARISON:  12/09/2019   FINDINGS: Cardiovascular: Aortic atherosclerosis. Unchanged enlargement of the tubular ascending thoracic aorta, measuring up 4.5 x 4.4 cm, unchanged when measured with similar technique. The descending thoracic aorta is normal in caliber measuring up to 2.8 x 2.8 cm. Normal heart size. Three-vessel coronary artery calcifications. No pericardial effusion.   Mediastinum/Nodes: No enlarged  mediastinal, hilar, or axillary lymph nodes. Thyroid gland, trachea, and esophagus demonstrate no significant findings.   Lungs/Pleura: Moderate centrilobular emphysema. Bandlike scarring of bilateral lung bases, and possible mild pulmonary fibrosis in a pattern featuring irregular peripheral interstitial opacity and septal thickening without clear apical to basal gradient. No pleural effusion or pneumothorax.   Upper Abdomen: No acute abnormality. Gallstones. Stable, definitively benign left adrenal adenoma.   Musculoskeletal: No chest wall mass or suspicious bone lesions identified.   IMPRESSION: 1. Unchanged enlargement of the tubular ascending thoracic aorta, measuring up to 4.5 x 4.4 cm,  unchanged when measured with similar technique. 2. Coronary artery disease. 3. Bandlike scarring of the bilateral lung bases, and possible mild underlying pulmonary fibrosis unchanged compared to prior examination. 4. Cholelithiasis.   Aortic Atherosclerosis (ICD10-I70.0).     Electronically Signed   By: Delanna Ahmadi M.D.   On: 01/07/2021 11:05 CT CHEST WITHOUT CONTRAST   TECHNIQUE: Multidetector CT imaging of the chest was performed following the standard protocol without IV contrast.   COMPARISON:  05/29/2019   FINDINGS: Cardiovascular: Measured in the same axial plane as comparison CT 05/29/2019, the ascending thoracic aorta measures 4.2 cm (image 73/3) compared to 4.2 cm.   Scattered atherosclerotic calcification of the aorta. Great vessels normal.   Coronary artery calcification and aortic atherosclerotic calcification. No axillary or supraclavicular adenopathy. No mediastinal or hilar adenopathy. No pericardial fluid. Esophagus normal.   Mediastinum/Nodes: No axillary or supraclavicular adenopathy. No mediastinal or hilar adenopathy. No pericardial fluid. Esophagus normal.   Lungs/Pleura:   Centrilobular emphysema the upper lobes. Mild bronchiectasis in lower lobes. Mild peripheral subpleural reticulation. These chronic lung parenchymal findings are unchanged from comparison exam.   Upper Abdomen: Several faceted gallstones.  No cholecystitis.   Nodule of the LEFT adrenal gland measures 13 mm and unchanged over multiple comparison exams back to 07/01/2017. Indeterminate lesion   Musculoskeletal: No acute osseous abnormality.   IMPRESSION: 1. No change in diameter of ascending thoracic aortic aneurysm at 4.2 cm. 2. Stable chronic interstitial lung disease. 3. Coronary artery calcification and Aortic Atherosclerosis (ICD10-I70.0). 4. Stable indeterminate LEFT adrenal nodule.     Electronically Signed   By: Suzy Bouchard M.D.   On: 12/09/2019  09:56 I personally reviewed the CT images and concur with the findings noted above      Vitals:    01/10/21 1018  BP: 99/66  Pulse: 75  Resp: 20  SpO2: 98%      Impression:   Thomas [name] is a 65 year old Asian gentleman with a history of atherosclerotic cardiovascular disease including thoracic aortic atherosclerosis, thoracic aortic aneurysm, right MCA occlusion, stroke, repair of ruptured abdominal aortic aneurysm, PAD, chronic kidney disease, hypertension, hyperlipidemia, tobacco abuse (quit 2019), and interstitial lung disease on CT.   Thoracic aortic atherosclerosis/ascending aortic aneurysm-stable at 4.5 cm x 4.4 cm  No indication for intervention.  Blood pressure control is important.  He was encouraged to get away to take his blood pressure at home.   Hypertension-blood pressure well controlled currently.  At times he has had some dizziness.  His blood pressure is on the low side today in our office however, I do not have any other values to go off of.  He may have some orthostatic hypotension due to dehydration since he is only drinking about 1 to 2 cups of water a day.  I encouraged him to increase his water intake as tolerated.   PAD-has an ABI of 0.63 on the right and 0.9  on the left.  He was waiting for an appointment with vascular surgery.  He is a patient of Dr. Stephens Shire from a previous stent graft for ruptured AAA.  He was seen Dec 2021 by [facility] and was suppose to have another appointment this year. He said he got COVID which has delayed his surgery. Now he is not sure if he wants it done because it isn't bothering him much.    Plan:   Return in 1 year with CT chest.  Will do without contrast     Nicholes Rough, PA-C [facility] [phone]

## 2021-02-09 ENCOUNTER — Other Ambulatory Visit: Payer: Self-pay | Admitting: Nurse Practitioner

## 2021-02-09 NOTE — Telephone Encounter (Signed)
Requested Prescriptions  Pending Prescriptions Disp Refills  . atorvastatin (LIPITOR) 20 MG tablet [Pharmacy Med Name: ATORVASTATIN 20MG  TABLETS] 90 tablet 1    Sig: TAKE 1 TABLET(20 MG) BY MOUTH DAILY AT 6 PM     Cardiovascular:  Antilipid - Statins Passed - 02/09/2021 11:14 PM      Passed - Total Cholesterol in normal range and within 360 days    Cholesterol, Total  Date Value Ref Range Status  12/21/2020 139 100 - 199 mg/dL Final         Passed - LDL in normal range and within 360 days    LDL Chol Calc (NIH)  Date Value Ref Range Status  12/21/2020 76 0 - 99 mg/dL Final         Passed - HDL in normal range and within 360 days    HDL  Date Value Ref Range Status  12/21/2020 48 >39 mg/dL Final         Passed - Triglycerides in normal range and within 360 days    Triglycerides  Date Value Ref Range Status  12/21/2020 75 0 - 149 mg/dL Final         Passed - Patient is not pregnant      Passed - Valid encounter within last 12 months    Recent Outpatient Visits          1 month ago Essential hypertension   [place], Vernia Buff, NP   9 months ago Essential hypertension   [place], Vernia Buff, NP   1 year ago Essential hypertension   [place], Vernia Buff, NP   1 year ago Essential hypertension   [place], Vernia Buff, NP   1 year ago Essential hypertension   [place], Vernia Buff, NP      Future Appointments            In 1 month Gildardo Pounds, NP [place]

## 2021-03-23 ENCOUNTER — Ambulatory Visit: Payer: PPO | Attending: Nurse Practitioner | Admitting: Nurse Practitioner

## 2021-03-23 ENCOUNTER — Encounter: Payer: Self-pay | Admitting: Nurse Practitioner

## 2021-03-23 ENCOUNTER — Other Ambulatory Visit: Payer: Self-pay

## 2021-03-23 DIAGNOSIS — I10 Essential (primary) hypertension: Secondary | ICD-10-CM | POA: Diagnosis not present

## 2021-03-23 DIAGNOSIS — K219 Gastro-esophageal reflux disease without esophagitis: Secondary | ICD-10-CM | POA: Diagnosis not present

## 2021-03-23 MED ORDER — VALSARTAN 40 MG PO TABS: 40.0000 mg | ORAL_TABLET | Freq: Every day | ORAL | 1 refills | Status: DC

## 2021-03-23 MED ORDER — OMEPRAZOLE 20 MG PO CPDR: 20.0000 mg | DELAYED_RELEASE_CAPSULE | Freq: Every day | ORAL | 0 refills | Status: DC

## 2021-03-23 NOTE — Progress Notes (Signed)
Assessment & Plan:  [name] was seen today for hypertension.  Diagnoses and all orders for this visit:  Essential hypertension -     CMP14+EGFR -     valsartan (DIOVAN) 40 MG tablet; Take 1 tablet (40 mg total) by mouth daily.  GERD without esophagitis -     omeprazole (PRILOSEC) 20 MG capsule; Take 1 capsule (20 mg total) by mouth daily.    Patient has been counseled on age-appropriate routine health concerns for screening and prevention. These are reviewed and up-to-date. Referrals have been placed accordingly. Immunizations are up-to-date or declined.    Subjective:   Chief Complaint  Patient presents with   Hypertension   HPI Thomas [name] 65 y.o. male presents to office today for follow up to HTN.  He is accompanied by daughter who is translating for him today   He has a past medical history of Chronic kidney disease, GERD, History of kidney stones, Hyperlipidemia, Hypertension, Occlusion of right middle cerebral artery, PVD, Ruptured abdominal aortic aneurysm, Stroke (06/2017), and Thoracic aortic aneurysm.    HTN Blood pressure is well controlled. He is taking amlodipine 5 mg daily and valsartan 40 mg daily as prescribed. Notes intermittent dizziness which only lasts a few seconds at a time and is not related to positional changes.  BP Readings from Last 3 Encounters:  03/23/21 107/70  01/10/21 99/66  01/03/21 121/82    GERD Well controlled with omeprazole 20 mg daily.     Review of Systems  Constitutional:  Negative for fever, malaise/fatigue and weight loss.  HENT: Negative.  Negative for nosebleeds.   Eyes: Negative.  Negative for blurred vision, double vision and photophobia.  Respiratory: Negative.  Negative for cough and shortness of breath.   Cardiovascular: Negative.  Negative for chest pain, palpitations and leg swelling.  Gastrointestinal:  Positive for heartburn. Negative for nausea and vomiting.  Musculoskeletal: Negative.  Negative for myalgias.   Neurological:  Positive for dizziness. Negative for focal weakness, seizures, weakness and headaches.  Psychiatric/Behavioral: Negative.  Negative for suicidal ideas.    Past Medical History:  Diagnosis Date   Chronic kidney disease    GERD (gastroesophageal reflux disease)    History of kidney stones    Hyperlipidemia    Hypertension    Occlusion of right middle cerebral artery    PVD (peripheral vascular disease) ([facility])    Ruptured abdominal aortic aneurysm (AAA)    Stroke ([place]) 06/2017   Thoracic aortic aneurysm (TAA)     Past Surgical History:  Procedure Laterality Date   CHOLECYSTECTOMY     COLONOSCOPY  2010   Dr.Hung   ENDOVASCULAR STENT INSERTION N/A 07/01/2017   Procedure: ENDOVASCULAR STENT GRAFT INSERTION;  Surgeon: Serafina [place], MD;  Location: [facility] OR;  Service: Vascular;  Laterality: N/A;   THROMBECTOMY FEMORAL ARTERY Bilateral 07/01/2017   Procedure: Bilateral IlioFemoral Thrombectomies;  Surgeon: Serafina [place], MD;  Location: [facility] OR;  Service: Vascular;  Laterality: Bilateral;    Family History  Family history unknown: Yes    Social History Reviewed with no changes to be made today.   Outpatient Medications Prior to Visit  Medication Sig Dispense Refill   atorvastatin (LIPITOR) 20 MG tablet TAKE 1 TABLET(20 MG) BY MOUTH DAILY AT 6 PM 90 tablet 1   omeprazole (PRILOSEC) 20 MG capsule TAKE 1 CAPSULE(20 MG) BY MOUTH DAILY 90 capsule 0   amLODipine (NORVASC) 5 MG tablet Take 1 tablet (5 mg total) by mouth daily. 90 tablet 1  valsartan (DIOVAN) 40 MG tablet Take 1 tablet (40 mg total) by mouth daily. 90 tablet 1   No facility-administered medications prior to visit.    No Known Allergies     Objective:    BP 107/70    Pulse 64    Ht 5' 7" (1.702 m)    Wt 139 lb 9.6 oz (63.3 kg)    SpO2 99%    BMI 21.86 kg/m  Wt Readings from Last 3 Encounters:  03/23/21 139 lb 9.6 oz (63.3 kg)  01/10/21 139 lb (63 kg)  01/03/21 137 lb 5 oz (62.3 kg)    Physical  Exam Vitals and nursing note reviewed.  Constitutional:      Appearance: He is well-developed.  HENT:     Head: Normocephalic and atraumatic.  Cardiovascular:     Rate and Rhythm: Normal rate and regular rhythm.     Heart sounds: Normal heart sounds. No murmur heard.   No friction rub. No gallop.  Pulmonary:     Effort: Pulmonary effort is normal. No tachypnea or respiratory distress.     Breath sounds: Normal breath sounds. No decreased breath sounds, wheezing, rhonchi or rales.  Chest:     Chest wall: No tenderness.  Abdominal:     General: Bowel sounds are normal.     Palpations: Abdomen is soft.  Musculoskeletal:        General: Normal range of motion.     Cervical back: Normal range of motion.  Skin:    General: Skin is warm and dry.  Neurological:     Mental Status: He is alert and oriented to person, place, and time.     Coordination: Coordination normal.     Comments:  Using a straight cane to assist with mobility   Psychiatric:        Behavior: Behavior normal. Behavior is cooperative.        Thought Content: Thought content normal.        Judgment: Judgment normal.         Patient has been counseled extensively about nutrition and exercise as well as the importance of adherence with medications and regular follow-up. The patient was given clear instructions to go to ER or return to medical center if symptoms don't improve, worsen or new problems develop. The patient verbalized understanding.   Follow-up: Return in about 3 months (around 06/21/2021).   Gildardo Pounds, FNP-BC [facility] [place], [place]   03/23/2021, 4:34 PM

## 2021-03-24 LAB — CMP14+EGFR
ALT: 21 IU/L (ref 0–44)
AST: 23 IU/L (ref 0–40)
Albumin/Globulin Ratio: 1.2 (ref 1.2–2.2)
Albumin: 4.4 g/dL (ref 3.8–4.8)
Alkaline Phosphatase: 122 IU/L — ABNORMAL HIGH (ref 44–121)
BUN/Creatinine Ratio: 11 (ref 10–24)
BUN: 19 mg/dL (ref 8–27)
Bilirubin Total: 0.4 mg/dL (ref 0.0–1.2)
CO2: 22 mmol/L (ref 20–29)
Calcium: 9.5 mg/dL (ref 8.6–10.2)
Chloride: 102 mmol/L (ref 96–106)
Creatinine, Ser: 1.74 mg/dL — ABNORMAL HIGH (ref 0.76–1.27)
Globulin, Total: 3.8 g/dL (ref 1.5–4.5)
Glucose: 88 mg/dL (ref 70–99)
Potassium: 4.9 mmol/L (ref 3.5–5.2)
Sodium: 139 mmol/L (ref 134–144)
Total Protein: 8.2 g/dL (ref 6.0–8.5)
eGFR: 42 mL/min/{1.73_m2} — ABNORMAL LOW (ref >59)

## 2021-03-29 ENCOUNTER — Other Ambulatory Visit: Payer: Self-pay | Admitting: Nurse Practitioner

## 2021-03-29 DIAGNOSIS — N1831 Chronic kidney disease, stage 3a: Secondary | ICD-10-CM

## 2021-04-06 ENCOUNTER — Inpatient Hospital Stay: Payer: PPO

## 2021-04-06 ENCOUNTER — Inpatient Hospital Stay: Payer: PPO | Admitting: Hematology and Oncology

## 2021-04-07 ENCOUNTER — Other Ambulatory Visit: Payer: Self-pay | Admitting: *Deleted

## 2021-04-07 DIAGNOSIS — D72820 Lymphocytosis (symptomatic): Secondary | ICD-10-CM

## 2021-04-08 ENCOUNTER — Inpatient Hospital Stay: Payer: PPO

## 2021-04-08 ENCOUNTER — Other Ambulatory Visit: Payer: Self-pay | Admitting: Hematology and Oncology

## 2021-04-08 ENCOUNTER — Inpatient Hospital Stay: Payer: PPO | Admitting: Hematology and Oncology

## 2021-04-08 DIAGNOSIS — D72820 Lymphocytosis (symptomatic): Secondary | ICD-10-CM

## 2021-04-17 ENCOUNTER — Other Ambulatory Visit: Payer: Self-pay | Admitting: Nurse Practitioner

## 2021-04-17 DIAGNOSIS — I10 Essential (primary) hypertension: Secondary | ICD-10-CM

## 2021-04-17 NOTE — Telephone Encounter (Signed)
Requested medication (s) are due for refill today: yes  Requested medication (s) are on the active medication list: expired 01/19/21  Last refill:  12/21/20  Future visit scheduled: yes  Notes to clinic:  expired medication   Requested Prescriptions  Pending Prescriptions Disp Refills   amLODipine (NORVASC) 5 MG tablet [Pharmacy Med Name: AMLODIPINE BESYLATE 5MG  TABLETS] 90 tablet 1    Sig: TAKE 1 TABLET(5 MG) BY MOUTH DAILY     Cardiovascular:  Calcium Channel Blockers Passed - 04/17/2021  1:13 AM      Passed - Last BP in normal range    BP Readings from Last 1 Encounters:  03/23/21 107/70          Passed - Valid encounter within last 6 months    Recent Outpatient Visits           3 weeks ago Essential hypertension   [place], Vernia Buff, NP   3 months ago Essential hypertension   [place], Vernia Buff, NP   11 months ago Essential hypertension   [place], Vernia Buff, NP   1 year ago Essential hypertension   [place], Vernia Buff, NP   1 year ago Essential hypertension   [place], Vernia Buff, NP       Future Appointments             In 2 months Gildardo Pounds, NP [place]

## 2021-04-27 ENCOUNTER — Other Ambulatory Visit: Payer: Self-pay

## 2021-04-27 ENCOUNTER — Inpatient Hospital Stay: Payer: PPO | Attending: Hematology and Oncology

## 2021-04-27 ENCOUNTER — Inpatient Hospital Stay (HOSPITAL_BASED_OUTPATIENT_CLINIC_OR_DEPARTMENT_OTHER): Payer: PPO | Admitting: Hematology and Oncology

## 2021-04-27 VITALS — BP 116/78 | HR 77 | Temp 97.5°F | Resp 18 | Wt 140.6 lb

## 2021-04-27 DIAGNOSIS — I129 Hypertensive chronic kidney disease with stage 1 through stage 4 chronic kidney disease, or unspecified chronic kidney disease: Secondary | ICD-10-CM | POA: Diagnosis not present

## 2021-04-27 DIAGNOSIS — N189 Chronic kidney disease, unspecified: Secondary | ICD-10-CM | POA: Insufficient documentation

## 2021-04-27 DIAGNOSIS — Z87891 Personal history of nicotine dependence: Secondary | ICD-10-CM | POA: Insufficient documentation

## 2021-04-27 DIAGNOSIS — D72820 Lymphocytosis (symptomatic): Secondary | ICD-10-CM | POA: Diagnosis not present

## 2021-04-27 LAB — CMP (CANCER CENTER ONLY)
ALT: 16 U/L (ref 0–44)
AST: 21 U/L (ref 15–41)
Albumin: 4.3 g/dL (ref 3.5–5.0)
Alkaline Phosphatase: 106 U/L (ref 38–126)
Anion gap: 6 (ref 5–15)
BUN: 22 mg/dL (ref 8–23)
CO2: 27 mmol/L (ref 22–32)
Calcium: 9.8 mg/dL (ref 8.9–10.3)
Chloride: 106 mmol/L (ref 98–111)
Creatinine: 1.74 mg/dL — ABNORMAL HIGH (ref 0.61–1.24)
GFR, Estimated: 42 mL/min — ABNORMAL LOW (ref >60)
Glucose, Bld: 79 mg/dL (ref 70–99)
Potassium: 4.7 mmol/L (ref 3.5–5.1)
Sodium: 139 mmol/L (ref 135–145)
Total Bilirubin: 0.4 mg/dL (ref 0.3–1.2)
Total Protein: 8.6 g/dL — ABNORMAL HIGH (ref 6.5–8.1)

## 2021-04-27 LAB — C-REACTIVE PROTEIN: CRP: 0.7 mg/dL (ref <1.0)

## 2021-04-27 LAB — CBC WITH DIFFERENTIAL (CANCER CENTER ONLY)
Abs Immature Granulocytes: 0.03 10*3/uL (ref 0.00–0.07)
Basophils Absolute: 0.1 10*3/uL (ref 0.0–0.1)
Basophils Relative: 1 %
Eosinophils Absolute: 3.3 10*3/uL — ABNORMAL HIGH (ref 0.0–0.5)
Eosinophils Relative: 24 %
HCT: 41.8 % (ref 39.0–52.0)
Hemoglobin: 13.9 g/dL (ref 13.0–17.0)
Immature Granulocytes: 0 %
Lymphocytes Relative: 34 %
Lymphs Abs: 4.7 10*3/uL — ABNORMAL HIGH (ref 0.7–4.0)
MCH: 26.6 pg (ref 26.0–34.0)
MCHC: 33.3 g/dL (ref 30.0–36.0)
MCV: 80.1 fL (ref 80.0–100.0)
Monocytes Absolute: 1 10*3/uL (ref 0.1–1.0)
Monocytes Relative: 7 %
Neutro Abs: 4.7 10*3/uL (ref 1.7–7.7)
Neutrophils Relative %: 34 %
Platelet Count: 306 10*3/uL (ref 150–400)
RBC: 5.22 MIL/uL (ref 4.22–5.81)
RDW: 15.1 % (ref 11.5–15.5)
WBC Count: 13.8 10*3/uL — ABNORMAL HIGH (ref 4.0–10.5)
nRBC: 0 % (ref 0.0–0.2)

## 2021-04-27 LAB — SEDIMENTATION RATE: Sed Rate: 15 mm/hr (ref 0–16)

## 2021-04-27 LAB — LACTATE DEHYDROGENASE: LDH: 179 U/L (ref 98–192)

## 2021-04-27 NOTE — Progress Notes (Signed)
[place] [phone]   [phone]  PROGRESS NOTE  Patient Care Team: Gildardo Pounds, NP as PCP - General (Nurse Practitioner) Sanda Klein, MD as PCP - Cardiology (Cardiology)  Hematological/Oncological History # Leukocytosis/Lymphocytosis 11/19/2019: WBC 11.8, Hgb 14.3, MCV 80, Plt 327 01/28/2020: WBC 11.6, Hgb 14.6, MCV 81, Plt 371 12/21/2020: WBC 13.3, Hgb 14.3, MCV 78, Plt 316 01/03/2021: establish care with Dr. Lorenso Courier. WBC 12.2, Hgb 14.3, MCV 79.6, Plt 318    Interval History:  Thomas [name] 66 y.o. male with medical history significant for unexplained leukocytosis/lymphocytosis who presents for a follow up visit. The patient's last visit was on 01/03/2021 at which time he established care. In the interim since the last visit he has had no major changes in his health.  On exam today Thomas [name] is accompanied by his daughter.  He reports that he feels "so-so" in the interim since his last visit.  He notes his energy levels are good and he has been having no difficulty with weight or appetite.  He denies any lymphadenopathy and reports he has had no issues with nausea, vomiting, or diarrhea.  He also denies any fevers, chills, sweats.  He has begun taking no new medications and has had no other new diagnoses in the interim since her last visit.  A full 10 point ROS is listed below.  MEDICAL HISTORY:  Past Medical History:  Diagnosis Date   Chronic kidney disease    GERD (gastroesophageal reflux disease)    History of kidney stones    Hyperlipidemia    Hypertension    Occlusion of right middle cerebral artery    PVD (peripheral vascular disease) (HCC)    Ruptured abdominal aortic aneurysm (AAA)    Stroke ([place]) 06/2017   Thoracic aortic aneurysm (TAA)     SURGICAL HISTORY: Past Surgical History:  Procedure Laterality Date   CHOLECYSTECTOMY     COLONOSCOPY  2010   Dr.Hung   ENDOVASCULAR STENT INSERTION N/A 07/01/2017   Procedure: ENDOVASCULAR  STENT GRAFT INSERTION;  Surgeon: Serafina Mitchell, MD;  Location: [facility] OR;  Service: Vascular;  Laterality: N/A;   THROMBECTOMY FEMORAL ARTERY Bilateral 07/01/2017   Procedure: Bilateral IlioFemoral Thrombectomies;  Surgeon: Serafina Mitchell, MD;  Location: [facility] OR;  Service: Vascular;  Laterality: Bilateral;    SOCIAL HISTORY: Social History   Socioeconomic History   Marital status: Divorced    Spouse name: Not on file   Number of children: Not on file   Years of education: Not on file   Highest education level: Not on file  Occupational History   Not on file  Tobacco Use   Smoking status: Former    Types: Cigarettes    Quit date: 07/16/2017    Years since quitting: 3.7   Smokeless tobacco: Never  Vaping Use   Vaping Use: Never used  Substance and Sexual Activity   Alcohol use: No   Drug use: No   Sexual activity: Not Currently  Other Topics Concern   Not on file  Social History Narrative   Not on file   Social Determinants of Health   Financial Resource Strain: Not on file  Food Insecurity: Not on file  Transportation Needs: Not on file  Physical Activity: Not on file  Stress: Not on file  Social Connections: Not on file  Intimate Partner Violence: Not on file    FAMILY HISTORY: Family History  Family history unknown: Yes    ALLERGIES:  has No Known Allergies.  MEDICATIONS:  Current Outpatient Medications  Medication Sig Dispense Refill   amLODipine (NORVASC) 5 MG tablet TAKE 1 TABLET(5 MG) BY MOUTH DAILY 90 tablet 1   atorvastatin (LIPITOR) 20 MG tablet TAKE 1 TABLET(20 MG) BY MOUTH DAILY AT 6 PM 90 tablet 1   omeprazole (PRILOSEC) 20 MG capsule Take 1 capsule (20 mg total) by mouth daily. 90 capsule 0   valsartan (DIOVAN) 40 MG tablet Take 1 tablet (40 mg total) by mouth daily. 90 tablet 1   No current facility-administered medications for this visit.    REVIEW OF SYSTEMS:   Constitutional: ( - ) fevers, ( - )  chills , ( - ) night sweats Eyes: ( - )  blurriness of vision, ( - ) double vision, ( - ) watery eyes Ears, nose, mouth, throat, and face: ( - ) mucositis, ( - ) sore throat Respiratory: ( - ) cough, ( - ) dyspnea, ( - ) wheezes Cardiovascular: ( - ) palpitation, ( - ) chest discomfort, ( - ) lower extremity swelling Gastrointestinal:  ( - ) nausea, ( - ) heartburn, ( - ) change in bowel habits Skin: ( - ) abnormal skin rashes Lymphatics: ( - ) new lymphadenopathy, ( - ) easy bruising Neurological: ( - ) numbness, ( - ) tingling, ( - ) new weaknesses Behavioral/Psych: ( - ) mood change, ( - ) new changes  All other systems were reviewed with the patient and are negative.  PHYSICAL EXAMINATION: ECOG PERFORMANCE STATUS: 0 - Asymptomatic  Vitals:   04/27/21 1120  BP: 116/78  Pulse: 77  Resp: 18  Temp: (!) 97.5 F (36.4 C)  SpO2: 100%   Filed Weights   04/27/21 1120  Weight: 140 lb 9.6 oz (63.8 kg)    GENERAL: Well-appearing elderly Asian male, alert, no distress and comfortable SKIN: skin color, texture, turgor are normal, no rashes or significant lesions EYES: conjunctiva are pink and non-injected, sclera clear LUNGS: clear to auscultation and percussion with normal breathing effort HEART: regular rate & rhythm and no murmurs and no lower extremity edema Musculoskeletal: no cyanosis of digits and no clubbing  PSYCH: alert & oriented x 3, fluent speech NEURO: no focal motor/sensory deficits  LABORATORY DATA:  I have reviewed the data as listed CBC Latest Ref Rng & Units 04/27/2021 01/03/2021 12/21/2020  WBC 4.0 - 10.5 K/uL 13.8(H) 12.2(H) 13.3(H)  Hemoglobin 13.0 - 17.0 g/dL 13.9 14.3 14.3  Hematocrit 39.0 - 52.0 % 41.8 43.7 44.4  Platelets 150 - 400 K/uL 306 318 316    CMP Latest Ref Rng & Units 04/27/2021 03/23/2021 12/21/2020  Glucose 70 - 99 mg/dL 79 88 88  BUN 8 - 23 mg/dL 22 19 19   Creatinine 0.61 - 1.24 mg/dL 1.74(H) 1.74(H) 1.66(H)  Sodium 135 - 145 mmol/L 139 139 137  Potassium 3.5 - 5.1 mmol/L 4.7 4.9 4.7   Chloride 98 - 111 mmol/L 106 102 99  CO2 22 - 32 mmol/L 27 22 22   Calcium 8.9 - 10.3 mg/dL 9.8 9.5 9.5  Total Protein 6.5 - 8.1 g/dL 8.6(H) 8.2 8.1  Total Bilirubin 0.3 - 1.2 mg/dL 0.4 0.4 0.5  Alkaline Phos 38 - 126 U/L 106 122(H) 130(H)  AST 15 - 41 U/L 21 23 22   ALT 0 - 44 U/L 16 21 17    RADIOGRAPHIC STUDIES: No results found.  ASSESSMENT & PLAN Thomas [name] 66 y.o. male with medical history significant for unexplained leukocytosis/lymphocytosis who presents for a follow up visit.  #  Leukocytosis -- Strangely the predominance of this leukocytosis has varied between neutrophilic, lymphocytic, and eosinophilic.  This would support an inflammatory cause though inflammatory markers are unremarkable.  -- Labs today show white blood cell count 13.8, hemoglobin 13.9, and platelets 306 --If no clear etiology can be found will need to consider MPN work-up, though this is considerably less likely with a lymphocytosis and no other increased CBC values. --Return to clinic in 6 months for labs and 12 months for follow up visit   No orders of the defined types were placed in this encounter.   All questions were answered. The patient knows to call the clinic with any problems, questions or concerns.  A total of more than 25 minutes were spent on this encounter with face-to-face time and non-face-to-face time, including preparing to see the patient, ordering tests and/or medications, counseling the patient and coordination of care as outlined above.   Ledell Peoples, MD Department of Hematology/Oncology [place] [facility] Phone: [phone] Pager: [phone] Email: [email or web address]  04/27/2021 3:52 PM

## 2021-04-28 ENCOUNTER — Telehealth: Payer: Self-pay | Admitting: Hematology and Oncology

## 2021-04-28 NOTE — Telephone Encounter (Signed)
Scheduled per 2/1 los calender has been mailed to pt

## 2021-06-22 ENCOUNTER — Ambulatory Visit: Payer: PPO | Admitting: Nurse Practitioner

## 2021-06-25 ENCOUNTER — Other Ambulatory Visit: Payer: Self-pay | Admitting: Nurse Practitioner

## 2021-06-25 DIAGNOSIS — K219 Gastro-esophageal reflux disease without esophagitis: Secondary | ICD-10-CM

## 2021-06-27 NOTE — Telephone Encounter (Signed)
Requested Prescriptions  ?Pending Prescriptions Disp Refills  ?? omeprazole (PRILOSEC) 20 MG capsule [Pharmacy Med Name: OMEPRAZOLE '20MG'$  CAPSULES] 90 capsule 2  ?  Sig: TAKE 1 CAPSULE(20 MG) BY MOUTH DAILY  ?  ? Gastroenterology: Proton Pump Inhibitors Passed - 06/25/2021 11:12 AM  ?  ?  Passed - Valid encounter within last 12 months  ?  Recent Outpatient Visits   ?      ? 3 months ago Essential hypertension  ? [place], [place] W, NP  ? 6 months ago Essential hypertension  ? [place], Vernia Buff, NP  ? 1 year ago Essential hypertension  ? [place], Vernia Buff, NP  ? 1 year ago Essential hypertension  ? [place], Vernia Buff, NP  ? 1 year ago Essential hypertension  ? [place] Thomas Pounds, NP  ?  ?  ?Future Appointments   ?        ? Tomorrow Thomas Pounds, NP [place]  ?  ? ?  ?  ?  ? ?

## 2021-06-28 ENCOUNTER — Encounter: Payer: Self-pay | Admitting: Nurse Practitioner

## 2021-06-28 ENCOUNTER — Telehealth (HOSPITAL_BASED_OUTPATIENT_CLINIC_OR_DEPARTMENT_OTHER): Payer: PPO | Admitting: Nurse Practitioner

## 2021-06-28 DIAGNOSIS — Z0100 Encounter for examination of eyes and vision without abnormal findings: Secondary | ICD-10-CM | POA: Diagnosis not present

## 2021-06-28 DIAGNOSIS — I10 Essential (primary) hypertension: Secondary | ICD-10-CM | POA: Diagnosis not present

## 2021-06-28 NOTE — Progress Notes (Signed)
Virtual Visit via Telephone Note ?Due to national recommendations of social distancing due to [place], telehealth visit is felt to be most appropriate for this patient at this time.  I discussed the limitations, risks, security and privacy concerns of performing an evaluation and management service by telephone and the availability of in person appointments. I also discussed with the patient that there may be a patient responsible charge related to this service. The patient expressed understanding and agreed to proceed.  ? ? ?I connected with Thomas [name] on 06/28/21  at  11:10 AM EDT  EDT by telephone and verified that I am speaking with the correct person using two identifiers. ? ?Location of Patient: ?Private Residence ?  ?Location of Provider: ?Scientist, research (physical sciences) and [employer] Office  ?  ?Persons participating in Telemedicine visit: ?Thomas Rankins FNP-BC ?Thomas [name]  ?Daughter [name] ?  ?History of Present Illness: ?Telemedicine visit for: HTN ?He has a past medical history of Chronic kidney disease, GERD, History of kidney stones, Hyperlipidemia, Hypertension, Occlusion of right middle cerebral artery, PVD, Ruptured abdominal aortic aneurysm, Stroke (06/2017), and Thoracic aortic aneurysm.  ? ?Blood pressure is well controlled. He is taking amlodipine 5 mg daily and valsartan 40 mg daily. States he is feeling good today. Requesting referral to ophthalmology.  ?BP Readings from Last 3 Encounters:  ?04/27/21 116/78  ?03/23/21 107/70  ?01/10/21 99/66  ?  ? ? ? ?Past Medical History:  ?Diagnosis Date  ? Chronic kidney disease   ? GERD (gastroesophageal reflux disease)   ? History of kidney stones   ? Hyperlipidemia   ? Hypertension   ? Occlusion of right middle cerebral artery   ? PVD (peripheral vascular disease) ([place])   ? Ruptured abdominal aortic aneurysm (AAA) ([place])   ? Stroke [facility]) 06/2017  ? Thoracic aortic aneurysm (TAA) ([place])   ?  ?Past Surgical History:  ?Procedure Laterality Date  ? CHOLECYSTECTOMY     ? COLONOSCOPY  2010  ? Dr.Hung  ? ENDOVASCULAR STENT INSERTION N/A 07/01/2017  ? Procedure: ENDOVASCULAR STENT GRAFT INSERTION;  Surgeon: Serafina Mitchell, MD;  Location: [facility] OR;  Service: Vascular;  Laterality: N/A;  ? THROMBECTOMY FEMORAL ARTERY Bilateral 07/01/2017  ? Procedure: Bilateral IlioFemoral Thrombectomies;  Surgeon: Serafina Mitchell, MD;  Location: [facility] OR;  Service: Vascular;  Laterality: Bilateral;  ?  ?Family History  ?Family history unknown: Yes  ?  ?Social History  ? ?Socioeconomic History  ? Marital status: Divorced  ?  Spouse name: Not on file  ? Number of children: Not on file  ? Years of education: Not on file  ? Highest education level: Not on file  ?Occupational History  ? Not on file  ?Tobacco Use  ? Smoking status: Former  ?  Types: Cigarettes  ?  Quit date: 07/16/2017  ?  Years since quitting: 3.9  ? Smokeless tobacco: Never  ?Vaping Use  ? Vaping Use: Never used  ?Substance and Sexual Activity  ? Alcohol use: No  ? Drug use: No  ? Sexual activity: Not Currently  ?Other Topics Concern  ? Not on file  ?Social History Narrative  ? Not on file  ? ?Social Determinants of Health  ? ?Financial Resource Strain: Not on file  ?Food Insecurity: Not on file  ?Transportation Needs: Not on file  ?Physical Activity: Not on file  ?Stress: Not on file  ?Social Connections: Not on file  ?  ? ?Observations/Objective: ?Awake, alert and oriented x 3 ? ? ?Review of  Systems  ?Constitutional:  Negative for fever, malaise/fatigue and weight loss.  ?HENT: Negative.  Negative for nosebleeds.   ?Eyes: Negative.  Negative for blurred vision, double vision and photophobia.  ?Respiratory: Negative.  Negative for cough and shortness of breath.   ?Cardiovascular: Negative.  Negative for chest pain, palpitations and leg swelling.  ?Gastrointestinal: Negative.  Negative for heartburn, nausea and vomiting.  ?Musculoskeletal: Negative.  Negative for myalgias.  ?Neurological: Negative.  Negative for dizziness, focal weakness,  seizures and headaches.  ?Psychiatric/Behavioral: Negative.  Negative for suicidal ideas.    ?Assessment and Plan: ?Diagnoses and all orders for this visit: ? ?Essential hypertension ?Continue amlodipine and valsartan as prescribed.  ?Bring BP log to next visit ? ? ?Encounter for routine eye and vision examination ?-     Ambulatory referral to Ophthalmology ? ?  ? ?Follow Up Instructions ?Return for HTN.  ? ?  ?I discussed the assessment and treatment plan with the patient. The patient was provided an opportunity to ask questions and all were answered. The patient agreed with the plan and demonstrated an understanding of the instructions. ?  ?The patient was advised to call back or seek an in-person evaluation if the symptoms worsen or if the condition fails to improve as anticipated. ? ?I provided 10 minutes of non-face-to-face time during this encounter including median intraservice time, reviewing previous notes, labs, imaging, medications and explaining diagnosis and management. ? ?Gildardo Pounds, FNP-BC  ?

## 2021-08-03 ENCOUNTER — Other Ambulatory Visit: Payer: Self-pay | Admitting: Nurse Practitioner

## 2021-09-20 ENCOUNTER — Other Ambulatory Visit: Payer: Self-pay | Admitting: Nurse Practitioner

## 2021-09-20 DIAGNOSIS — I10 Essential (primary) hypertension: Secondary | ICD-10-CM

## 2021-09-30 ENCOUNTER — Ambulatory Visit: Payer: PPO | Admitting: Nurse Practitioner

## 2021-10-13 ENCOUNTER — Other Ambulatory Visit: Payer: Self-pay | Admitting: Family Medicine

## 2021-10-13 DIAGNOSIS — I10 Essential (primary) hypertension: Secondary | ICD-10-CM

## 2021-10-26 ENCOUNTER — Other Ambulatory Visit: Payer: Self-pay | Admitting: Hematology and Oncology

## 2021-10-26 ENCOUNTER — Inpatient Hospital Stay: Payer: PPO | Attending: Hematology and Oncology

## 2021-10-26 ENCOUNTER — Other Ambulatory Visit: Payer: Self-pay

## 2021-10-26 DIAGNOSIS — D72829 Elevated white blood cell count, unspecified: Secondary | ICD-10-CM | POA: Diagnosis not present

## 2021-10-26 DIAGNOSIS — D72820 Lymphocytosis (symptomatic): Secondary | ICD-10-CM

## 2021-10-26 LAB — CMP (CANCER CENTER ONLY)
ALT: 12 U/L (ref 0–44)
AST: 15 U/L (ref 15–41)
Albumin: 4.4 g/dL (ref 3.5–5.0)
Alkaline Phosphatase: 106 U/L (ref 38–126)
Anion gap: 5 (ref 5–15)
BUN: 20 mg/dL (ref 8–23)
CO2: 26 mmol/L (ref 22–32)
Calcium: 9.3 mg/dL (ref 8.9–10.3)
Chloride: 106 mmol/L (ref 98–111)
Creatinine: 1.64 mg/dL — ABNORMAL HIGH (ref 0.61–1.24)
GFR, Estimated: 46 mL/min — ABNORMAL LOW (ref >60)
Glucose, Bld: 123 mg/dL — ABNORMAL HIGH (ref 70–99)
Potassium: 4.5 mmol/L (ref 3.5–5.1)
Sodium: 137 mmol/L (ref 135–145)
Total Bilirubin: 0.8 mg/dL (ref 0.3–1.2)
Total Protein: 8.5 g/dL — ABNORMAL HIGH (ref 6.5–8.1)

## 2021-10-26 LAB — CBC WITH DIFFERENTIAL (CANCER CENTER ONLY)
Abs Immature Granulocytes: 0.02 10*3/uL (ref 0.00–0.07)
Basophils Absolute: 0.1 10*3/uL (ref 0.0–0.1)
Basophils Relative: 1 %
Eosinophils Absolute: 1.8 10*3/uL — ABNORMAL HIGH (ref 0.0–0.5)
Eosinophils Relative: 18 %
HCT: 43.4 % (ref 39.0–52.0)
Hemoglobin: 14.8 g/dL (ref 13.0–17.0)
Immature Granulocytes: 0 %
Lymphocytes Relative: 30 %
Lymphs Abs: 3 10*3/uL (ref 0.7–4.0)
MCH: 26.9 pg (ref 26.0–34.0)
MCHC: 34.1 g/dL (ref 30.0–36.0)
MCV: 78.8 fL — ABNORMAL LOW (ref 80.0–100.0)
Monocytes Absolute: 0.6 10*3/uL (ref 0.1–1.0)
Monocytes Relative: 6 %
Neutro Abs: 4.3 10*3/uL (ref 1.7–7.7)
Neutrophils Relative %: 45 %
Platelet Count: 311 10*3/uL (ref 150–400)
RBC: 5.51 MIL/uL (ref 4.22–5.81)
RDW: 15.4 % (ref 11.5–15.5)
WBC Count: 9.8 10*3/uL (ref 4.0–10.5)
nRBC: 0 % (ref 0.0–0.2)

## 2021-10-26 LAB — LACTATE DEHYDROGENASE: LDH: 156 U/L (ref 98–192)

## 2021-11-09 ENCOUNTER — Other Ambulatory Visit: Payer: Self-pay | Admitting: Family Medicine

## 2021-12-01 ENCOUNTER — Other Ambulatory Visit: Payer: Self-pay | Admitting: *Deleted

## 2021-12-01 DIAGNOSIS — I7121 Aneurysm of the ascending aorta, without rupture: Secondary | ICD-10-CM

## 2021-12-12 DIAGNOSIS — H31011 Macula scars of posterior pole (postinflammatory) (post-traumatic), right eye: Secondary | ICD-10-CM | POA: Diagnosis not present

## 2021-12-12 DIAGNOSIS — H2513 Age-related nuclear cataract, bilateral: Secondary | ICD-10-CM | POA: Diagnosis not present

## 2021-12-19 ENCOUNTER — Encounter: Payer: Self-pay | Admitting: Gastroenterology

## 2021-12-21 ENCOUNTER — Ambulatory Visit: Payer: PPO | Attending: Nurse Practitioner | Admitting: Nurse Practitioner

## 2021-12-21 ENCOUNTER — Encounter: Payer: Self-pay | Admitting: Nurse Practitioner

## 2021-12-21 VITALS — BP 102/71 | HR 75 | Ht 67.0 in | Wt 146.4 lb

## 2021-12-21 DIAGNOSIS — I10 Essential (primary) hypertension: Secondary | ICD-10-CM

## 2021-12-21 DIAGNOSIS — Z23 Encounter for immunization: Secondary | ICD-10-CM | POA: Diagnosis not present

## 2021-12-21 DIAGNOSIS — E785 Hyperlipidemia, unspecified: Secondary | ICD-10-CM

## 2021-12-21 DIAGNOSIS — I6930 Unspecified sequelae of cerebral infarction: Secondary | ICD-10-CM | POA: Diagnosis not present

## 2021-12-21 DIAGNOSIS — R7303 Prediabetes: Secondary | ICD-10-CM | POA: Diagnosis not present

## 2021-12-21 MED ORDER — CLOPIDOGREL BISULFATE 75 MG PO TABS: 75.0000 mg | ORAL_TABLET | Freq: Every day | ORAL | 3 refills | Status: DC

## 2021-12-21 MED ORDER — VALSARTAN 40 MG PO TABS: 40.0000 mg | ORAL_TABLET | Freq: Every day | ORAL | 1 refills | Status: DC

## 2021-12-21 NOTE — Progress Notes (Signed)
Assessment & Plan:  Javonni was seen today for hypertension and medication refill.  Diagnoses and all orders for this visit:  Primary hypertension -     Basic metabolic panel -     valsartan (DIOVAN) 40 MG tablet; Take 1 tablet (40 mg total) by mouth daily.  Prediabetes -     Hemoglobin A1c -     valsartan (DIOVAN) 40 MG tablet; Take 1 tablet (40 mg total) by mouth daily.  Dyslipidemia, goal LDL below 70 -     Lipid panel  Chronic ischemic vertebrobasilar artery cerebellar stroke CONTINUE '81mg'$  ASA. Patient takes over the counter    Patient has been counseled on age-appropriate routine health concerns for screening and prevention. These are reviewed and up-to-date. Referrals have been placed accordingly. Immunizations are up-to-date or declined.    Subjective:   Chief Complaint  Patient presents with   Hypertension   Medication Refill   HPI Domanique Crill 66 y.o. male presents to office today for follow up to HTN. He is accompanied by his daughter today.    He has a past medical history of Chronic kidney disease, GERD, History of kidney stones, Hyperlipidemia, Hypertension, Occlusion of right middle cerebral artery PVD, Ruptured abdominal aortic aneurysm, Stroke (06/2017), and Thoracic aortic aneurysm(HCC).    HTN Blood pressure is well controlled with valsartan 40 mg daily and amlodipine 5 mg daily.  BP Readings from Last 3 Encounters:  12/21/21 102/71  04/27/21 116/78  03/23/21 107/70    He has a history of stroke (middle cerebral artery occlusion) and is currently taking ASA 81 mg daily as prescribed. Needs annual follow up with Neurology. Uses a cane for mobility.   Prediabetes Well controlled with diet at this time.  Lab Results  Component Value Date   HGBA1C 5.9 (H) 12/21/2020     Review of Systems  Constitutional:  Negative for fever, malaise/fatigue and weight loss.  HENT: Negative.  Negative for nosebleeds.   Eyes: Negative.  Negative for blurred vision,  double vision and photophobia.  Respiratory: Negative.  Negative for cough and shortness of breath.   Cardiovascular: Negative.  Negative for chest pain, palpitations and leg swelling.  Gastrointestinal: Negative.  Negative for heartburn, nausea and vomiting.  Musculoskeletal: Negative.  Negative for myalgias.  Neurological:  Positive for weakness. Negative for dizziness, focal weakness, seizures and headaches.  Psychiatric/Behavioral: Negative.  Negative for suicidal ideas.     Past Medical History:  Diagnosis Date   Chronic kidney disease    GERD (gastroesophageal reflux disease)    History of kidney stones    Hyperlipidemia    Hypertension    Occlusion of right middle cerebral artery    PVD (peripheral vascular disease) (HCC)    Ruptured abdominal aortic aneurysm (AAA) ([place])    Stroke ([place]) 06/2017   Thoracic aortic aneurysm (TAA) ([place])     Past Surgical History:  Procedure Laterality Date   CHOLECYSTECTOMY     COLONOSCOPY  2010   Dr.Hung   ENDOVASCULAR STENT INSERTION N/A 07/01/2017   Procedure: ENDOVASCULAR STENT GRAFT INSERTION;  Surgeon: Serafina Mitchell, MD;  Location: [facility] OR;  Service: Vascular;  Laterality: N/A;   THROMBECTOMY FEMORAL ARTERY Bilateral 07/01/2017   Procedure: Bilateral IlioFemoral Thrombectomies;  Surgeon: Serafina Mitchell, MD;  Location: [facility] OR;  Service: Vascular;  Laterality: Bilateral;    Family History  Family history unknown: Yes    Social History Reviewed with no changes to be made today.   Outpatient Medications Prior to  Visit  Medication Sig Dispense Refill   amLODipine (NORVASC) 5 MG tablet TAKE 1 TABLET(5 MG) BY MOUTH DAILY 90 tablet 0   atorvastatin (LIPITOR) 20 MG tablet TAKE 1 TABLET(20 MG) BY MOUTH DAILY AT 6 PM 90 tablet 0   omeprazole (PRILOSEC) 20 MG capsule TAKE 1 CAPSULE(20 MG) BY MOUTH DAILY 90 capsule 2   valsartan (DIOVAN) 40 MG tablet TAKE 1 TABLET(40 MG) BY MOUTH DAILY 90 tablet 1   No facility-administered medications prior  to visit.    No Known Allergies     Objective:    BP 102/71   Pulse 75   Ht '5\' 7"'$  (1.702 m)   Wt 146 lb 6.4 oz (66.4 kg)   SpO2 96%   BMI 22.93 kg/m  Wt Readings from Last 3 Encounters:  12/21/21 146 lb 6.4 oz (66.4 kg)  04/27/21 140 lb 9.6 oz (63.8 kg)  03/23/21 139 lb 9.6 oz (63.3 kg)    Physical Exam Vitals and nursing note reviewed.  Constitutional:      Appearance: He is well-developed.  HENT:     Head: Normocephalic and atraumatic.  Cardiovascular:     Rate and Rhythm: Normal rate and regular rhythm.     Heart sounds: Normal heart sounds. No murmur heard.    No friction rub. No gallop.  Pulmonary:     Effort: Pulmonary effort is normal. No tachypnea or respiratory distress.     Breath sounds: Normal breath sounds. No decreased breath sounds, wheezing, rhonchi or rales.  Chest:     Chest wall: No tenderness.  Abdominal:     General: Bowel sounds are normal.     Palpations: Abdomen is soft.  Musculoskeletal:        General: Normal range of motion.     Cervical back: Normal range of motion.  Skin:    General: Skin is warm and dry.  Neurological:     Mental Status: He is alert and oriented to person, place, and time.     Coordination: Coordination normal.     Gait: Gait abnormal (uses cane to assit with mobility).  Psychiatric:        Behavior: Behavior normal. Behavior is cooperative.        Thought Content: Thought content normal.        Judgment: Judgment normal.          Patient has been counseled extensively about nutrition and exercise as well as the importance of adherence with medications and regular follow-up. The patient was given clear instructions to go to ER or return to medical center if symptoms don't improve, worsen or new problems develop. The patient verbalized understanding.   Follow-up: Return in about 3 months (around 03/22/2022) for HTN.   Gildardo Pounds, FNP-BC [facility] [place],  [place]   12/21/2021, 4:29 PM

## 2021-12-21 NOTE — Patient Instructions (Signed)
[facility] Neurologist [address]  [phone]

## 2021-12-22 LAB — BASIC METABOLIC PANEL
BUN/Creatinine Ratio: 14 (ref 10–24)
BUN: 21 mg/dL (ref 8–27)
CO2: 24 mmol/L (ref 20–29)
Calcium: 9.4 mg/dL (ref 8.6–10.2)
Chloride: 102 mmol/L (ref 96–106)
Creatinine, Ser: 1.53 mg/dL — ABNORMAL HIGH (ref 0.76–1.27)
Glucose: 134 mg/dL — ABNORMAL HIGH (ref 70–99)
Potassium: 4.1 mmol/L (ref 3.5–5.2)
Sodium: 139 mmol/L (ref 134–144)
eGFR: 49 mL/min/{1.73_m2} — ABNORMAL LOW (ref >59)

## 2021-12-22 LAB — LIPID PANEL
Chol/HDL Ratio: 3.5 ratio (ref 0.0–5.0)
Cholesterol, Total: 150 mg/dL (ref 100–199)
HDL: 43 mg/dL (ref >39)
LDL Chol Calc (NIH): 80 mg/dL (ref 0–99)
Triglycerides: 156 mg/dL — ABNORMAL HIGH (ref 0–149)
VLDL Cholesterol Cal: 27 mg/dL (ref 5–40)

## 2021-12-22 LAB — HEMOGLOBIN A1C
Est. average glucose Bld gHb Est-mCnc: 123 mg/dL
Hgb A1c MFr Bld: 5.9 % — ABNORMAL HIGH (ref 4.8–5.6)

## 2022-01-03 NOTE — Progress Notes (Signed)
errror

## 2022-01-04 ENCOUNTER — Other Ambulatory Visit: Payer: PPO

## 2022-01-09 ENCOUNTER — Telehealth: Payer: Self-pay | Admitting: *Deleted

## 2022-01-09 NOTE — Telephone Encounter (Signed)
Yessi,   This pt is a documented difficult intubation and his procedure will need to be done at the hospital.    Thanks,  Zayneb Baucum 

## 2022-01-11 ENCOUNTER — Encounter: Payer: Self-pay | Admitting: Gastroenterology

## 2022-01-11 NOTE — Telephone Encounter (Signed)
Spoke to patient's daughter and scheduled OV w/Dr Silverio Decamp.

## 2022-01-17 ENCOUNTER — Other Ambulatory Visit: Payer: Self-pay | Admitting: Family Medicine

## 2022-01-17 DIAGNOSIS — I10 Essential (primary) hypertension: Secondary | ICD-10-CM

## 2022-01-26 ENCOUNTER — Encounter: Payer: PPO | Admitting: Gastroenterology

## 2022-01-31 ENCOUNTER — Other Ambulatory Visit: Payer: PPO

## 2022-02-08 ENCOUNTER — Other Ambulatory Visit: Payer: Self-pay | Admitting: Family Medicine

## 2022-02-08 MED ORDER — ATORVASTATIN CALCIUM 20 MG PO TABS: 20.0000 mg | ORAL_TABLET | Freq: Every day | ORAL | 1 refills | Status: DC

## 2022-03-01 ENCOUNTER — Other Ambulatory Visit: Payer: Self-pay | Admitting: Thoracic Surgery (Cardiothoracic Vascular Surgery)

## 2022-03-01 DIAGNOSIS — I7121 Aneurysm of the ascending aorta, without rupture: Secondary | ICD-10-CM

## 2022-03-02 ENCOUNTER — Inpatient Hospital Stay: Admission: RE | Admit: 2022-03-02 | Payer: PPO | Source: Ambulatory Visit

## 2022-03-02 NOTE — Progress Notes (Signed)
      [address]       [place] [address]             [phone]     Darryon Bastin [phone] 02-02-1956   [address]    Ellwood Handler, PA-C 03/02/22

## 2022-03-06 ENCOUNTER — Ambulatory Visit: Payer: PPO

## 2022-03-08 ENCOUNTER — Ambulatory Visit: Payer: PPO | Admitting: Gastroenterology

## 2022-03-22 ENCOUNTER — Ambulatory Visit: Payer: PPO | Admitting: Nurse Practitioner

## 2022-03-31 ENCOUNTER — Ambulatory Visit
Admission: RE | Admit: 2022-03-31 | Discharge: 2022-03-31 | Disposition: A | Discharge status: Home / Self Care | Payer: PPO | Source: Ambulatory Visit | Attending: Thoracic Surgery (Cardiothoracic Vascular Surgery) | Admitting: Thoracic Surgery (Cardiothoracic Vascular Surgery)

## 2022-03-31 DIAGNOSIS — J439 Emphysema, unspecified: Secondary | ICD-10-CM | POA: Diagnosis not present

## 2022-03-31 DIAGNOSIS — I7121 Aneurysm of the ascending aorta, without rupture: Secondary | ICD-10-CM | POA: Diagnosis not present

## 2022-03-31 DIAGNOSIS — I700 Atherosclerosis of aorta: Secondary | ICD-10-CM | POA: Diagnosis not present

## 2022-03-31 DIAGNOSIS — J479 Bronchiectasis, uncomplicated: Secondary | ICD-10-CM | POA: Diagnosis not present

## 2022-03-31 DIAGNOSIS — R911 Solitary pulmonary nodule: Secondary | ICD-10-CM | POA: Diagnosis not present

## 2022-04-11 ENCOUNTER — Ambulatory Visit: Payer: PPO | Admitting: Gastroenterology

## 2022-04-18 ENCOUNTER — Other Ambulatory Visit: Payer: Self-pay | Admitting: Family Medicine

## 2022-04-18 DIAGNOSIS — I10 Essential (primary) hypertension: Secondary | ICD-10-CM

## 2022-04-18 MED ORDER — AMLODIPINE BESYLATE 5 MG PO TABS: 5.0000 mg | ORAL_TABLET | Freq: Every day | ORAL | 1 refills | Status: DC

## 2022-04-26 ENCOUNTER — Telehealth: Payer: Self-pay | Admitting: Hematology and Oncology

## 2022-04-26 NOTE — Telephone Encounter (Signed)
Patient's daughter called to r/s f/u to late date due to patient being ill. Patient r/s and will be notified.

## 2022-04-27 ENCOUNTER — Inpatient Hospital Stay: Payer: PPO | Admitting: Hematology and Oncology

## 2022-04-27 ENCOUNTER — Inpatient Hospital Stay: Payer: PPO

## 2022-05-05 ENCOUNTER — Ambulatory Visit: Payer: PPO | Attending: Nurse Practitioner | Admitting: Nurse Practitioner

## 2022-05-05 ENCOUNTER — Encounter: Payer: Self-pay | Admitting: Nurse Practitioner

## 2022-05-05 VITALS — BP 109/70 | HR 66 | Ht 67.0 in | Wt 143.4 lb

## 2022-05-05 DIAGNOSIS — I10 Essential (primary) hypertension: Secondary | ICD-10-CM | POA: Diagnosis not present

## 2022-05-05 DIAGNOSIS — R7303 Prediabetes: Secondary | ICD-10-CM

## 2022-05-05 DIAGNOSIS — K219 Gastro-esophageal reflux disease without esophagitis: Secondary | ICD-10-CM

## 2022-05-05 DIAGNOSIS — Z23 Encounter for immunization: Secondary | ICD-10-CM

## 2022-05-05 MED ORDER — OMEPRAZOLE 20 MG PO CPDR: 20.0000 mg | DELAYED_RELEASE_CAPSULE | Freq: Every day | ORAL | 2 refills | Status: DC

## 2022-05-05 MED ORDER — VALSARTAN 40 MG PO TABS: 40.0000 mg | ORAL_TABLET | Freq: Every day | ORAL | 1 refills | Status: DC

## 2022-05-05 NOTE — Progress Notes (Signed)
Assessment & Plan:  Thomas [name] was seen today for hypertension.  Diagnoses and all orders for this visit:  Primary hypertension -     CMP14+EGFR -     valsartan (DIOVAN) 40 MG tablet; Take 1 tablet (40 mg total) by mouth daily. Continue all antihypertensives as prescribed.  Reminded to bring in blood pressure log for follow  up appointment.  RECOMMENDATIONS: DASH/Mediterranean Diets are healthier choices for HTN.    Prediabetes -     Hemoglobin A1c -     valsartan (DIOVAN) 40 MG tablet; Take 1 tablet (40 mg total) by mouth daily. Continue blood sugar control as discussed in office today, low carbohydrate diet, and regular physical exercise as tolerated, 150 minutes per week (30 min each day, 5 days per week, or 50 min 3 days per week). Keep blood sugar logs with fasting goal of 90-130 mg/dl, post prandial (after you eat) less than 180.  For Hypoglycemia: BS <60 and Hyperglycemia BS >400; contact the clinic ASAP. Annual eye exams and foot exams are recommended.   GERD without esophagitis -     omeprazole (PRILOSEC) 20 MG capsule; Take 1 capsule (20 mg total) by mouth daily. INSTRUCTIONS: Avoid GERD Triggers: acidic, spicy or fried foods, caffeine, coffee, sodas,  alcohol and chocolate.       Patient has been counseled on age-appropriate routine health concerns for screening and prevention. These are reviewed and up-to-date. Referrals have been placed accordingly. Immunizations are up-to-date or declined.    Subjective:   Chief Complaint  Patient presents with   Hypertension    Thomas [name] 67 y.o. male presents to office for follow up to HTN  He is accompanied by his daughter today.      He has a past medical history of Chronic kidney disease, GERD, History of kidney stones, Hyperlipidemia, Hypertension, Occlusion of right middle cerebral artery PVD, Ruptured abdominal aortic aneurysm, Stroke (06/2017), and Thoracic aortic aneurysm(HCC).     HTN Blood pressure is well controlled  today.  He is taking amlodipine and valsartan 40 mg daily as prescribed.  BP Readings from Last 3 Encounters:  05/05/22 109/70  12/21/21 102/71  04/27/21 116/78     Review of Systems  Constitutional:  Negative for fever, malaise/fatigue and weight loss.  HENT: Negative.  Negative for nosebleeds.   Eyes: Negative.  Negative for blurred vision, double vision and photophobia.  Respiratory: Negative.  Negative for cough and shortness of breath.   Cardiovascular: Negative.  Negative for chest pain, palpitations and leg swelling.  Gastrointestinal: Negative.  Negative for heartburn, nausea and vomiting.  Musculoskeletal: Negative.  Negative for myalgias.  Neurological: Negative.  Negative for dizziness, focal weakness, seizures and headaches.  Psychiatric/Behavioral: Negative.  Negative for suicidal ideas.     Past Medical History:  Diagnosis Date   Chronic kidney disease    GERD (gastroesophageal reflux disease)    History of kidney stones    Hyperlipidemia    Hypertension    Occlusion of right middle cerebral artery    PVD (peripheral vascular disease) (HCC)    Ruptured abdominal aortic aneurysm (AAA) ([place])    Stroke ([place]) 06/2017   Thoracic aortic aneurysm (TAA) ([place])     Past Surgical History:  Procedure Laterality Date   CHOLECYSTECTOMY     COLONOSCOPY  2010   Dr.Hung   ENDOVASCULAR STENT INSERTION N/A 07/01/2017   Procedure: ENDOVASCULAR STENT GRAFT INSERTION;  Surgeon: Serafina Mitchell, MD;  Location: [facility];  Service: Vascular;  Laterality: N/A;  THROMBECTOMY FEMORAL ARTERY Bilateral 07/01/2017   Procedure: Bilateral IlioFemoral Thrombectomies;  Surgeon: Serafina Mitchell, MD;  Location: [facility] OR;  Service: Vascular;  Laterality: Bilateral;    Family History  Family history unknown: Yes    Social History Reviewed with no changes to be made today.   Outpatient Medications Prior to Visit  Medication Sig Dispense Refill   amLODipine (NORVASC) 5 MG tablet Take 1 tablet (5 mg  total) by mouth daily. 90 tablet 1   atorvastatin (LIPITOR) 20 MG tablet Take 1 tablet (20 mg total) by mouth daily. 90 tablet 1   omeprazole (PRILOSEC) 20 MG capsule TAKE 1 CAPSULE(20 MG) BY MOUTH DAILY 90 capsule 2   valsartan (DIOVAN) 40 MG tablet Take 1 tablet (40 mg total) by mouth daily. 90 tablet 1   No facility-administered medications prior to visit.    No Known Allergies     Objective:    BP 109/70   Pulse 66   Ht 5' 7"$  (1.702 m)   Wt 143 lb 6.4 oz (65 kg)   SpO2 98%   BMI 22.46 kg/m  Wt Readings from Last 3 Encounters:  05/05/22 143 lb 6.4 oz (65 kg)  12/21/21 146 lb 6.4 oz (66.4 kg)  04/27/21 140 lb 9.6 oz (63.8 kg)    Physical Exam Vitals and nursing note reviewed.  Constitutional:      Appearance: He is well-developed.  HENT:     Head: Normocephalic and atraumatic.  Cardiovascular:     Rate and Rhythm: Normal rate and regular rhythm.     Heart sounds: Normal heart sounds. No murmur heard.    No friction rub. No gallop.  Pulmonary:     Effort: Pulmonary effort is normal. No tachypnea or respiratory distress.     Breath sounds: Normal breath sounds. No decreased breath sounds, wheezing, rhonchi or rales.  Chest:     Chest wall: No tenderness.  Abdominal:     General: Bowel sounds are normal.     Palpations: Abdomen is soft.  Musculoskeletal:        General: Normal range of motion.     Cervical back: Normal range of motion.  Skin:    General: Skin is warm and dry.  Neurological:     Mental Status: He is alert and oriented to person, place, and time.     Coordination: Coordination normal.  Psychiatric:        Behavior: Behavior normal. Behavior is cooperative.        Thought Content: Thought content normal.        Judgment: Judgment normal.          Patient has been counseled extensively about nutrition and exercise as well as the importance of adherence with medications and regular follow-up. The patient was given clear instructions to go to ER  or return to medical center if symptoms don't improve, worsen or new problems develop. The patient verbalized understanding.   Follow-up: Return in about 6 months (around 11/03/2022).   Gildardo Pounds, FNP-BC [facility], [place]   05/05/2022, 4:20 PM

## 2022-05-05 NOTE — Addendum Note (Signed)
Addended by: Gasper Lloyd on: 05/05/2022 04:31 PM   Modules accepted: Orders

## 2022-05-06 LAB — CMP14+EGFR
ALT: 12 IU/L (ref 0–44)
AST: 16 IU/L (ref 0–40)
Albumin/Globulin Ratio: 1.5 (ref 1.2–2.2)
Albumin: 4.5 g/dL (ref 3.9–4.9)
Alkaline Phosphatase: 116 IU/L (ref 44–121)
BUN/Creatinine Ratio: 9 — ABNORMAL LOW (ref 10–24)
BUN: 18 mg/dL (ref 8–27)
Bilirubin Total: 0.3 mg/dL (ref 0.0–1.2)
CO2: 22 mmol/L (ref 20–29)
Calcium: 9.4 mg/dL (ref 8.6–10.2)
Chloride: 105 mmol/L (ref 96–106)
Creatinine, Ser: 1.94 mg/dL — ABNORMAL HIGH (ref 0.76–1.27)
Globulin, Total: 3.1 g/dL (ref 1.5–4.5)
Glucose: 95 mg/dL (ref 70–99)
Potassium: 4.8 mmol/L (ref 3.5–5.2)
Sodium: 141 mmol/L (ref 134–144)
Total Protein: 7.6 g/dL (ref 6.0–8.5)
eGFR: 37 mL/min/{1.73_m2} — ABNORMAL LOW (ref >59)

## 2022-05-06 LAB — HEMOGLOBIN A1C
Est. average glucose Bld gHb Est-mCnc: 117 mg/dL
Hgb A1c MFr Bld: 5.7 % — ABNORMAL HIGH (ref 4.8–5.6)

## 2022-05-08 ENCOUNTER — Telehealth: Payer: Self-pay | Admitting: Nurse Practitioner

## 2022-05-08 NOTE — Telephone Encounter (Signed)
Madlyn Frankel, daughter, given lab results, instructions. Verbalizes understanding.

## 2022-05-29 ENCOUNTER — Telehealth: Payer: Self-pay | Admitting: Nurse Practitioner

## 2022-05-29 NOTE — Telephone Encounter (Signed)
Called patient to schedule Medicare Annual Wellness Visit (AWV). Left message for patient to call back and schedule Medicare Annual Wellness Visit (AWV).  Last date of AWV:  due awvi 10/26/19 per [employer]   If any questions, please contact me at [phone].  Thank you ,  Barkley Boards AWV direct phone # [phone]

## 2022-05-29 NOTE — Telephone Encounter (Signed)
Contacted Thomas [name] to schedule their annual wellness visit. Appointment made for 06/02/22.  [name] [facility] AWV direct phone # [phone]

## 2022-06-02 ENCOUNTER — Ambulatory Visit: Payer: PPO | Attending: Nurse Practitioner

## 2022-06-02 VITALS — Ht 67.0 in | Wt 144.0 lb

## 2022-06-02 DIAGNOSIS — Z0000 Encounter for general adult medical examination without abnormal findings: Secondary | ICD-10-CM

## 2022-06-02 NOTE — Patient Instructions (Signed)
Thomas [name] , Thank you for taking time to come for your Medicare Wellness Visit. I appreciate your ongoing commitment to your health goals. Please review the following plan we discussed and let me know if I can assist you in the future.   These are the goals we discussed:  Goals      Patient Stated     06/02/2022, no goals        This is a list of the screening recommended for you and due dates:  Health Maintenance  Topic Date Due   COVID-19 Vaccine (1) Never done   Colon Cancer Screening  12/24/2021   Zoster (Shingles) Vaccine (1 of 2) 08/03/2022*   Medicare Annual Wellness Visit  06/02/2023   Pneumonia Vaccine  Completed   Flu Shot  Completed   Hepatitis C Screening: USPSTF Recommendation to screen - Ages 18-79 yo.  Completed   HPV Vaccine  Aged Out   DTaP/Tdap/Td vaccine  Discontinued  *Topic was postponed. The date shown is not the original due date.    Advanced directives: Advance directive discussed with you today.   Conditions/risks identified: none  Next appointment: Follow up in one year for your annual wellness visit.   Preventive Care 69 Years and Older, Male  Preventive care refers to lifestyle choices and visits with your health care provider that can promote health and wellness. What does preventive care include? A yearly physical exam. This is also called an annual well check. Dental exams once or twice a year. Routine eye exams. Ask your health care provider how often you should have your eyes checked. Personal lifestyle choices, including: Daily care of your teeth and gums. Regular physical activity. Eating a healthy diet. Avoiding tobacco and drug use. Limiting alcohol use. Practicing safe sex. Taking low doses of aspirin every day. Taking vitamin and mineral supplements as recommended by your health care provider. What happens during an annual well check? The services and screenings done by your health care provider during your annual well check will  depend on your age, overall health, lifestyle risk factors, and family history of disease. Counseling  Your health care provider may ask you questions about your: Alcohol use. Tobacco use. Drug use. Emotional well-being. Home and relationship well-being. Sexual activity. Eating habits. History of falls. Memory and ability to understand (cognition). Work and work Statistician. Screening  You may have the following tests or measurements: Height, weight, and BMI. Blood pressure. Lipid and cholesterol levels. These may be checked every 5 years, or more frequently if you are over 38 years old. Skin check. Lung cancer screening. You may have this screening every year starting at age 69 if you have a 30-pack-year history of smoking and currently smoke or have quit within the past 15 years. Fecal occult blood test (FOBT) of the stool. You may have this test every year starting at age 69. Flexible sigmoidoscopy or colonoscopy. You may have a sigmoidoscopy every 5 years or a colonoscopy every 10 years starting at age 69. Prostate cancer screening. Recommendations will vary depending on your family history and other risks. Hepatitis C blood test. Hepatitis B blood test. Sexually transmitted disease (STD) testing. Diabetes screening. This is done by checking your blood sugar (glucose) after you have not eaten for a while (fasting). You may have this done every 1-3 years. Abdominal aortic aneurysm (AAA) screening. You may need this if you are a current or former smoker. Osteoporosis. You may be screened starting at age 11 if you are  at high risk. Talk with your health care provider about your test results, treatment options, and if necessary, the need for more tests. Vaccines  Your health care provider may recommend certain vaccines, such as: Influenza vaccine. This is recommended every year. Tetanus, diphtheria, and acellular pertussis (Tdap, Td) vaccine. You may need a Td booster every 10  years. Zoster vaccine. You may need this after age 22. Pneumococcal 13-valent conjugate (PCV13) vaccine. One dose is recommended after age 8. Pneumococcal polysaccharide (PPSV23) vaccine. One dose is recommended after age 58. Talk to your health care provider about which screenings and vaccines you need and how often you need them. This information is not intended to replace advice given to you by your health care provider. Make sure you discuss any questions you have with your health care provider. Document Released: 04/09/2015 Document Revised: 12/01/2015 Document Reviewed: 01/12/2015 Elsevier Interactive Patient Education  2017 [employer] Prevention in the Home Falls can cause injuries. They can happen to people of all ages. There are many things you can do to make your home safe and to help prevent falls. What can I do on the outside of my home? Regularly fix the edges of walkways and driveways and fix any cracks. Remove anything that might make you trip as you walk through a door, such as a raised step or threshold. Trim any bushes or trees on the path to your home. Use bright outdoor lighting. Clear any walking paths of anything that might make someone trip, such as rocks or tools. Regularly check to see if handrails are loose or broken. Make sure that both sides of any steps have handrails. Any raised decks and porches should have guardrails on the edges. Have any leaves, snow, or ice cleared regularly. Use sand or salt on walking paths during winter. Clean up any spills in your garage right away. This includes oil or grease spills. What can I do in the bathroom? Use night lights. Install grab bars by the toilet and in the tub and shower. Do not use towel bars as grab bars. Use non-skid mats or decals in the tub or shower. If you need to sit down in the shower, use a plastic, non-slip stool. Keep the floor dry. Clean up any water that spills on the floor as soon as it  happens. Remove soap buildup in the tub or shower regularly. Attach bath mats securely with double-sided non-slip rug tape. Do not have throw rugs and other things on the floor that can make you trip. What can I do in the bedroom? Use night lights. Make sure that you have a light by your bed that is easy to reach. Do not use any sheets or blankets that are too big for your bed. They should not hang down onto the floor. Have a firm chair that has side arms. You can use this for support while you get dressed. Do not have throw rugs and other things on the floor that can make you trip. What can I do in the kitchen? Clean up any spills right away. Avoid walking on wet floors. Keep items that you use a lot in easy-to-reach places. If you need to reach something above you, use a strong step stool that has a grab bar. Keep electrical cords out of the way. Do not use floor polish or wax that makes floors slippery. If you must use wax, use non-skid floor wax. Do not have throw rugs and other things on the floor  that can make you trip. What can I do with my stairs? Do not leave any items on the stairs. Make sure that there are handrails on both sides of the stairs and use them. Fix handrails that are broken or loose. Make sure that handrails are as long as the stairways. Check any carpeting to make sure that it is firmly attached to the stairs. Fix any carpet that is loose or worn. Avoid having throw rugs at the top or bottom of the stairs. If you do have throw rugs, attach them to the floor with carpet tape. Make sure that you have a light switch at the top of the stairs and the bottom of the stairs. If you do not have them, ask someone to add them for you. What else can I do to help prevent falls? Wear shoes that: Do not have high heels. Have rubber bottoms. Are comfortable and fit you well. Are closed at the toe. Do not wear sandals. If you use a stepladder: Make sure that it is fully opened.  Do not climb a closed stepladder. Make sure that both sides of the stepladder are locked into place. Ask someone to hold it for you, if possible. Clearly mark and make sure that you can see: Any grab bars or handrails. First and last steps. Where the edge of each step is. Use tools that help you move around (mobility aids) if they are needed. These include: Canes. Walkers. Scooters. Crutches. Turn on the lights when you go into a dark area. Replace any light bulbs as soon as they burn out. Set up your furniture so you have a clear path. Avoid moving your furniture around. If any of your floors are uneven, fix them. If there are any pets around you, be aware of where they are. Review your medicines with your doctor. Some medicines can make you feel dizzy. This can increase your chance of falling. Ask your doctor what other things that you can do to help prevent falls. This information is not intended to replace advice given to you by your health care provider. Make sure you discuss any questions you have with your health care provider. Document Released: 01/07/2009 Document Revised: 08/19/2015 Document Reviewed: 04/17/2014 Elsevier Interactive Patient Education  2017 [employer].

## 2022-06-02 NOTE — Progress Notes (Signed)
I connected with  Charlyne Mom on 06/02/22 by a audio enabled telemedicine application and verified that I am speaking with the correct person using two identifiers.  Patient Location: Home  Provider Location: Office/Clinic  I discussed the limitations of evaluation and management by telemedicine. The patient expressed understanding and agreed to proceed. . Subjective:   Thomas [name] is a 67 y.o. male who presents for Medicare Annual/Subsequent preventive examination.  Review of Systems     Cardiac Risk Factors include: advanced age (>46mn, >>24women);hypertension;male gender     Objective:    Today's Vitals   06/02/22 0813  Weight: 144 lb (65.3 kg)  Height: '5\' 7"'$  (1.702 m)   Body mass index is 22.55 kg/m.     06/02/2022    8:17 AM 04/19/2020    8:46 AM 06/23/2018    1:11 PM 10/01/2017    8:58 AM 08/18/2017    4:54 PM 06/28/2017    6:13 PM 06/28/2017    3:47 PM  Advanced Directives  Does Patient Have a Medical Advance Directive? No No No No No No No  Would patient like information on creating a medical advance directive?  No - Patient declined No - Patient declined No - Patient declined No - Patient declined No - Patient declined No - Patient declined    Current Medications (verified) Outpatient Encounter Medications as of 06/02/2022  Medication Sig   amLODipine (NORVASC) 5 MG tablet Take 1 tablet (5 mg total) by mouth daily.   atorvastatin (LIPITOR) 20 MG tablet Take 1 tablet (20 mg total) by mouth daily.   omeprazole (PRILOSEC) 20 MG capsule Take 1 capsule (20 mg total) by mouth daily.   valsartan (DIOVAN) 40 MG tablet Take 1 tablet (40 mg total) by mouth daily.   No facility-administered encounter medications on file as of 06/02/2022.    Allergies (verified) Patient has no known allergies.   History: Past Medical History:  Diagnosis Date   Chronic kidney disease    GERD (gastroesophageal reflux disease)    History of kidney stones    Hyperlipidemia    Hypertension     Occlusion of right middle cerebral artery    PVD (peripheral vascular disease) (HCC)    [place]upt[place] abdominal aortic aneurysm (AAA) ([place]    Stroke ([place] 06/2017   Thoracic aortic aneurysm (TAA) (HCC)    Past Surgical History:  Procedure Laterality Date   CHOLECYSTECTOMY     COLONOSCOPY  2010   Dr.Hung   ENDOVASCULAR STENT INSERTION N/A 07/01/2017   Procedure: ENDOVASCULAR STENT GRAFT INSERTION;  Surgeon: BSerafina Mitchell MD;  [facility];  Service: Vascular;  Laterality: N/A;   THROMBECTOMY FEMORAL ARTERY Bilateral 07/01/2017   Procedure: Bilateral IlioFemoral Thrombectomies;  Surgeon: BSerafina Mitchell MD;  Location: [facility] OR;  Service: Vascular;  Laterality: Bilateral;   Family History  Family history unknown: Yes   Social History   Socioeconomic History   Marital status: Divorced    Spouse name: Not on file   Number of children: Not on file   Years of education: Not on file   Highest education level: Not on file  Occupational History   Not on file  Tobacco Use   Smoking status: Former    Types: Cigarettes    Quit date: 07/16/2017    Years since quitting: 4.8   Smokeless tobacco: Never  Vaping Use   Vaping Use: Never used  Substance and Sexual Activity   Alcohol use: No   Drug use: No  Sexual activity: Not Currently  Other Topics Concern   Not on file  Social History Narrative   Not on file   Social Determinants of Health   Financial Resource Strain: Low Risk  (06/02/2022)   Overall Financial Resource Strain (CARDIA)    Difficulty of Paying Living Expenses: Not hard at all  Food Insecurity: No Food Insecurity (06/02/2022)   Hunger Vital Sign    Worried About Running Out of Food in the Last Year: Never true    Ran Out of Food in the Last Year: Never true  Transportation Needs: No Transportation Needs (06/02/2022)   PRAPARE - Hydrologist (Medical): No    Lack of Transportation (Non-Medical): No  Physical Activity: Inactive (06/02/2022)    Exercise Vital Sign    Days of Exercise per Week: 0 days    Minutes of Exercise per Session: 0 min  Stress: No Stress Concern Present (06/02/2022)   [place]    Feeling of Stress : Not at all  Social Connections: Not on file    Tobacco Counseling Counseling given: Not Answered   Clinical Intake:  Pre-visit preparation completed: Yes  Pain : No/denies pain     Nutritional Status: BMI of 19-24  Normal Nutritional Risks: None Diabetes: No  How often do you need to have someone help you when you read instructions, pamphlets, or other written materials from your doctor or pharmacy?: 3 - Sometimes  Diabetic? no  Interpreter Needed?: No  Information entered by :: NAllen LPN   Activities of Daily Living    06/02/2022    8:18 AM  In your present state of health, do you have any difficulty performing the following activities:  Hearing? 0  Vision? 0  Difficulty concentrating or making decisions? 0  Walking or climbing stairs? 0  Dressing or bathing? 0  Doing errands, shopping? 0  Preparing Food and eating ? N  Using the Toilet? N  In the past six months, have you accidently leaked urine? N  Do you have problems with loss of bowel control? N  Managing your Medications? N  Managing your Finances? N  Housekeeping or managing your Housekeeping? N    Patient Care Team: Gildardo Pounds, NP as PCP - General (Nurse Practitioner) Sanda Klein, MD as PCP - Cardiology (Cardiology)  Indicate any recent Medical Services you may have received from other than [facility] providers in the past year (date may be approximate).     Assessment:   This is a routine wellness examination for Thomas [name].  Hearing/Vision screen Vision Screening - Comments:: Regular eye exams,  Dietary issues and exercise activities discussed: Current Exercise Habits: The patient does not participate in regular exercise at present   Goals  Addressed             This Visit's Progress    Patient Stated       06/02/2022, no goals       Depression Screen    06/02/2022    8:18 AM 12/21/2021    4:11 PM 03/23/2021    4:23 PM 12/21/2020   11:38 AM 01/28/2020    9:15 AM 10/22/2019    9:08 AM 11/08/2018   10:14 AM  PHQ 2/9 Scores  PHQ - 2 Score 0 0 0 0 0 0 2  PHQ- 9 Score   '2 3 3 3 8    '$ Fall Risk    06/02/2022    8:18 AM  05/05/2022    4:03 PM 12/21/2021    4:10 PM 03/23/2021    4:04 PM 12/21/2020   11:37 AM  Fall Risk   Falls in the past year? 0 0 0 0 1  Number falls in past yr: 0 0 0  0  Injury with Fall? 0 0 0  0  Risk for fall due to : Medication side effect No Fall Risks No Fall Risks    Follow up Falls prevention discussed;Education provided;Falls evaluation completed Falls evaluation completed       FALL RISK PREVENTION PERTAINING TO THE HOME:  Any stairs in or around the home? Yes  If so, are there any without handrails? No  Home free of loose throw rugs in walkways, pet beds, electrical cords, etc? Yes  Adequate lighting in your home to reduce risk of falls? Yes   ASSISTIVE DEVICES UTILIZED TO PREVENT FALLS:  Life alert? No  Use of a cane, walker or w/c? No  Grab bars in the bathroom? No  Shower chair or bench in shower? No  Elevated toilet seat or a handicapped toilet? No   TIMED UP AND GO:  Was the test performed? No .      Cognitive Function:        06/02/2022    8:19 AM  6CIT Screen  What Year? 0 points  What month? 0 points  What time? 0 points  Count back from 20 0 points  Months in reverse 0 points  Repeat phrase 8 points  Total Score 8 points    Immunizations Immunization History  Administered Date(s) Administered   Fluad Quad(high Dose 65+) 12/21/2021   Influenza,inj,Quad PF,6+ Mos 12/20/2018, 12/21/2020   PNEUMOCOCCAL CONJUGATE-20 05/05/2022   Pneumococcal Polysaccharide-23 08/19/2017    TDAP status: Due, Education has been provided regarding the importance of this  vaccine. Advised may receive this vaccine at local pharmacy or Health Dept. Aware to provide a copy of the vaccination record if obtained from local pharmacy or Health Dept. Verbalized acceptance and understanding.  Flu Vaccine status: Up to date  Pneumococcal vaccine status: Up to date  Covid-19 vaccine status: Declined, Education has been provided regarding the importance of this vaccine but patient still declined. Advised may receive this vaccine at local pharmacy or Health Dept.or vaccine clinic. Aware to provide a copy of the vaccination record if obtained from local pharmacy or Health Dept. Verbalized acceptance and understanding.  Qualifies for Shingles Vaccine? Yes   Zostavax completed No   Shingrix Completed?: No.    Education has been provided regarding the importance of this vaccine. Patient has been advised to call insurance company to determine out of pocket expense if they have not yet received this vaccine. Advised may also receive vaccine at local pharmacy or Health Dept. Verbalized acceptance and understanding.  Screening Tests Health Maintenance  Topic Date Due   Medicare Annual Wellness (AWV)  Never done   COVID-19 Vaccine (1) Never done   COLONOSCOPY (Pts 45-79yr Insurance coverage will need to be confirmed)  12/24/2021   Zoster Vaccines- Shingrix (1 of 2) 08/03/2022 (Originally 11/10/2003)   Pneumonia Vaccine 67 Years old  Completed   INFLUENZA VACCINE  Completed   Hepatitis C Screening  Completed   HPV VACCINES  Aged Out   DTaP/Tdap/Td  Discontinued    Health Maintenance  Health Maintenance Due  Topic Date Due   Medicare Annual Wellness (AWV)  Never done   COVID-19 Vaccine (1) Never done   COLONOSCOPY (Pts 45-428yrInsurance  coverage will need to be confirmed)  12/24/2021    Colorectal cancer screening: Type of screening: Colonoscopy. Completed 12/25/2018. Repeat every 3 years  Lung Cancer Screening: (Low Dose CT Chest recommended if Age 22-80 years, 30  pack-year currently smoking OR have quit w/in 15years.) does not qualify.   Lung Cancer Screening Referral: no  Additional Screening:  Hepatitis C Screening: does qualify; Completed 11/08/2018  Vision Screening: Recommended annual ophthalmology exams for early detection of glaucoma and other disorders of the eye. Is the patient up to date with their annual eye exam?  No  Who is the provider or what is the name of the office in which the patient attends annual eye exams? none If pt is not established with a provider, would they like to be referred to a provider to establish care? No .   Dental Screening: Recommended annual dental exams for proper oral hygiene  Community Resource Referral / Chronic Care Management: CRR required this visit?  No   CCM required this visit?  No      Plan:     I have personally reviewed and noted the following in the patient's chart:   Medical and social history Use of alcohol, tobacco or illicit drugs  Current medications and supplements including opioid prescriptions. Patient is not currently taking opioid prescriptions. Functional ability and status Nutritional status Physical activity Advanced directives List of other physicians Hospitalizations, surgeries, and ER visits in previous 12 months Vitals Screenings to include cognitive, depression, and falls Referrals and appointments  In addition, I have reviewed and discussed with patient certain preventive protocols, quality metrics, and best practice recommendations. A written personalized care plan for preventive services as well as general preventive health recommendations were provided to patient.     Kellie Simmering, LPN   [id number]   Nurse Notes: none  Due to this being a virtual visit, the after visit summary with patients personalized plan was offered to patient via mail or my-chart. Patient would like to access on my-chart

## 2022-06-18 ENCOUNTER — Other Ambulatory Visit: Payer: Self-pay | Admitting: Hematology and Oncology

## 2022-06-18 DIAGNOSIS — D72820 Lymphocytosis (symptomatic): Secondary | ICD-10-CM

## 2022-06-18 NOTE — Progress Notes (Unsigned)
[facility] Telephone:([phone]) [phone]   [phone]  PROGRESS NOTE  Patient Care Team: Claiborne Rigg, NP as PCP - General (Nurse Practitioner) Thurmon Fair, MD as PCP - Cardiology (Cardiology)  Hematological/Oncological History # Leukocytosis/Lymphocytosis 11/19/2019: WBC 11.8, Hgb 14.3, MCV 80, Plt 327 01/28/2020: WBC 11.6, Hgb 14.6, MCV 81, Plt 371 12/21/2020: WBC 13.3, Hgb 14.3, MCV 78, Plt 316 01/03/2021: establish care with Dr. Leonides Schanz. WBC 12.2, Hgb 14.3, MCV 79.6, Plt 318    Interval History:  Thomas [name] 67 y.o. male with medical history significant for unexplained leukocytosis/lymphocytosis who presents for a follow up visit. The patient's last visit was on 04/27/2021. In the interim since the last visit he has had no major changes in his health.  On exam today Thomas [name] is accompanied by his daughter.  He reports ***.  A full 10 point ROS is listed below.  MEDICAL HISTORY:  Past Medical History:  Diagnosis Date   Chronic kidney disease    GERD (gastroesophageal reflux disease)    History of kidney stones    Hyperlipidemia    Hypertension    Occlusion of right middle cerebral artery    PVD (peripheral vascular disease) (HCC)    Ruptured abdominal aortic aneurysm (AAA) (HCC)    Stroke (HCC) 06/2017   Thoracic aortic aneurysm (TAA) (HCC)     SURGICAL HISTORY: Past Surgical History:  Procedure Laterality Date   CHOLECYSTECTOMY     COLONOSCOPY  2010   Dr.Hung   ENDOVASCULAR STENT INSERTION N/A 07/01/2017   Procedure: ENDOVASCULAR STENT GRAFT INSERTION;  Surgeon: Nada Libman, MD;  Location: [facility] OR;  Service: Vascular;  Laterality: N/A;   THROMBECTOMY FEMORAL ARTERY Bilateral 07/01/2017   Procedure: Bilateral IlioFemoral Thrombectomies;  Surgeon: Nada Libman, MD;  Location: [facility] OR;  Service: Vascular;  Laterality: Bilateral;    SOCIAL HISTORY: Social History   Socioeconomic History   Marital status: Divorced    Spouse name: Not on file    Number of children: Not on file   Years of education: Not on file   Highest education level: Not on file  Occupational History   Not on file  Tobacco Use   Smoking status: Former    Types: Cigarettes    Quit date: 07/16/2017    Years since quitting: 4.9   Smokeless tobacco: Never  Vaping Use   Vaping Use: Never used  Substance and Sexual Activity   Alcohol use: No   Drug use: No   Sexual activity: Not Currently  Other Topics Concern   Not on file  Social History Narrative   Not on file   Social Determinants of Health   Financial Resource Strain: Low Risk  (06/02/2022)   Overall Financial Resource Strain (CARDIA)    Difficulty of Paying Living Expenses: Not hard at all  Food Insecurity: No Food Insecurity (06/02/2022)   Hunger Vital Sign    Worried About Running Out of Food in the Last Year: Never true    Ran Out of Food in the Last Year: Never true  Transportation Needs: No Transportation Needs (06/02/2022)   PRAPARE - Administrator, Civil Service (Medical): No    Lack of Transportation (Non-Medical): No  Physical Activity: Inactive (06/02/2022)   Exercise Vital Sign    Days of Exercise per Week: 0 days    Minutes of Exercise per Session: 0 min  Stress: No Stress Concern Present (06/02/2022)   [employer] of Occupational Health - Occupational Stress Questionnaire  Feeling of Stress : Not at all  Social Connections: Not on file  Intimate Partner Violence: Not on file    FAMILY HISTORY: Family History  Family history unknown: Yes    ALLERGIES:  has No Known Allergies.  MEDICATIONS:  Current Outpatient Medications  Medication Sig Dispense Refill   amLODipine (NORVASC) 5 MG tablet Take 1 tablet (5 mg total) by mouth daily. 90 tablet 1   atorvastatin (LIPITOR) 20 MG tablet Take 1 tablet (20 mg total) by mouth daily. 90 tablet 1   omeprazole (PRILOSEC) 20 MG capsule Take 1 capsule (20 mg total) by mouth daily. 90 capsule 2   valsartan (DIOVAN) 40 MG  tablet Take 1 tablet (40 mg total) by mouth daily. 90 tablet 1   No current facility-administered medications for this visit.    REVIEW OF SYSTEMS:   Constitutional: ( - ) fevers, ( - )  chills , ( - ) night sweats Eyes: ( - ) blurriness of vision, ( - ) double vision, ( - ) watery eyes Ears, nose, mouth, throat, and face: ( - ) mucositis, ( - ) sore throat Respiratory: ( - ) cough, ( - ) dyspnea, ( - ) wheezes Cardiovascular: ( - ) palpitation, ( - ) chest discomfort, ( - ) lower extremity swelling Gastrointestinal:  ( - ) nausea, ( - ) heartburn, ( - ) change in bowel habits Skin: ( - ) abnormal skin rashes Lymphatics: ( - ) new lymphadenopathy, ( - ) easy bruising Neurological: ( - ) numbness, ( - ) tingling, ( - ) new weaknesses Behavioral/Psych: ( - ) mood change, ( - ) new changes  All other systems were reviewed with the patient and are negative.  PHYSICAL EXAMINATION: ECOG PERFORMANCE STATUS: 0 - Asymptomatic  There were no vitals filed for this visit.  There were no vitals filed for this visit.   GENERAL: Well-appearing elderly Asian male, alert, no distress and comfortable SKIN: skin color, texture, turgor are normal, no rashes or significant lesions EYES: conjunctiva are pink and non-injected, sclera clear LUNGS: clear to auscultation and percussion with normal breathing effort HEART: regular rate & rhythm and no murmurs and no lower extremity edema Musculoskeletal: no cyanosis of digits and no clubbing  PSYCH: alert & oriented x 3, fluent speech NEURO: no focal motor/sensory deficits  LABORATORY DATA:  I have reviewed the data as listed    Latest Ref Rng & Units 10/26/2021   11:26 AM 04/27/2021   10:59 AM 01/03/2021    3:04 PM  CBC  WBC 4.0 - 10.5 K/uL 9.8  13.8  12.2   Hemoglobin 13.0 - 17.0 g/dL 09.8  11.9  14.7   Hematocrit 39.0 - 52.0 % 43.4  41.8  43.7   Platelets 150 - 400 K/uL 311  306  318        Latest Ref Rng & Units 05/05/2022    4:30 PM 12/21/2021     4:27 PM 10/26/2021   11:26 AM  CMP  Glucose 70 - 99 mg/dL 95  829  562   BUN 8 - 27 mg/dL 18  21  20    Creatinine 0.76 - 1.27 mg/dL 1.30  8.65  7.84   Sodium 134 - 144 mmol/L 141  139  137   Potassium 3.5 - 5.2 mmol/L 4.8  4.1  4.5   Chloride 96 - 106 mmol/L 105  102  106   CO2 20 - 29 mmol/L 22  24  26  Calcium 8.6 - 10.2 mg/dL 9.4  9.4  9.3   Total Protein 6.0 - 8.5 g/dL 7.6   8.5   Total Bilirubin 0.0 - 1.2 mg/dL 0.3   0.8   Alkaline Phos 44 - 121 IU/L 116   106   AST 0 - 40 IU/L 16   15   ALT 0 - 44 IU/L 12   12    RADIOGRAPHIC STUDIES: No results found.  ASSESSMENT & PLAN Thomas [name] 67 y.o. male with medical history significant for unexplained leukocytosis/lymphocytosis who presents for a follow up visit.  #Leukocytosis -- Strangely the predominance of this leukocytosis has varied between neutrophilic, lymphocytic, and eosinophilic.  This would support an inflammatory cause though inflammatory markers are unremarkable.  -- Labs today show white blood cell count *** --If no clear etiology can be found will need to consider MPN work-up, though this is considerably less likely with a lymphocytosis and no other increased CBC values. --Return to clinic in 6 months for labs and 12 months for follow up visit   No orders of the defined types were placed in this encounter.   All questions were answered. The patient knows to call the clinic with any problems, questions or concerns.  A total of more than 25 minutes were spent on this encounter with face-to-face time and non-face-to-face time, including preparing to see the patient, ordering tests and/or medications, counseling the patient and coordination of care as outlined above.   Ulysees Barns, MD Department of Hematology/Oncology [facility] Phone: [phone] Pager: [phone] Email: [email or web address]  06/18/2022 6:44 PM

## 2022-06-19 ENCOUNTER — Inpatient Hospital Stay: Payer: PPO | Admitting: Hematology and Oncology

## 2022-06-19 ENCOUNTER — Inpatient Hospital Stay: Payer: PPO | Attending: Nurse Practitioner

## 2022-06-19 DIAGNOSIS — D72820 Lymphocytosis (symptomatic): Secondary | ICD-10-CM

## 2022-07-27 ENCOUNTER — Other Ambulatory Visit: Payer: Self-pay | Admitting: Family Medicine

## 2022-07-27 DIAGNOSIS — I10 Essential (primary) hypertension: Secondary | ICD-10-CM

## 2022-07-27 NOTE — Telephone Encounter (Signed)
Requested Prescriptions  Pending Prescriptions Disp Refills   amLODipine (NORVASC) 5 MG tablet [Pharmacy Med Name: AMLODIPINE BESYLATE 5MG  TABLETS] 90 tablet 1    Sig: TAKE 1 TABLET(5 MG) BY MOUTH DAILY     Cardiovascular: Calcium Channel Blockers 2 Passed - 07/27/2022 10:41 AM      Passed - Last BP in normal range    BP Readings from Last 1 Encounters:  05/05/22 109/70         Passed - Last Heart Rate in normal range    Pulse Readings from Last 1 Encounters:  05/05/22 66         Passed - Valid encounter within last 6 months    Recent Outpatient Visits           2 months ago Primary hypertension   [place] [facility] [place], Shea Stakes, NP   7 months ago Primary hypertension   [place] [facility] [place], Shea Stakes, NP   1 year ago Essential hypertension   [place] [facility] [place], Shea Stakes, NP   1 year ago Essential hypertension   [place] [facility] [place], Shea Stakes, NP   1 year ago Essential hypertension   [place] [facility] Claiborne Rigg, NP       Future Appointments             In 3 months Claiborne Rigg, NP [facility]

## 2022-08-22 ENCOUNTER — Other Ambulatory Visit: Payer: Self-pay | Admitting: Thoracic Surgery (Cardiothoracic Vascular Surgery)

## 2022-08-22 ENCOUNTER — Encounter: Payer: Self-pay | Admitting: Thoracic Surgery (Cardiothoracic Vascular Surgery)

## 2022-08-22 ENCOUNTER — Ambulatory Visit (INDEPENDENT_AMBULATORY_CARE_PROVIDER_SITE_OTHER): Payer: PPO | Admitting: Thoracic Surgery (Cardiothoracic Vascular Surgery)

## 2022-08-22 ENCOUNTER — Other Ambulatory Visit: Payer: Self-pay | Admitting: Nurse Practitioner

## 2022-08-22 VITALS — BP 111/60 | HR 69 | Resp 20 | Ht 67.0 in | Wt 140.0 lb

## 2022-08-22 DIAGNOSIS — I7121 Aneurysm of the ascending aorta, without rupture: Secondary | ICD-10-CM

## 2022-08-22 NOTE — Telephone Encounter (Signed)
Medication Refill - Medication: atorvastatin (LIPITOR) 20 MG tablet   Has the patient contacted their pharmacy? Yes.   Pharmacy stated they needed to contact provider  Preferred Pharmacy (with phone number or street name):  [id number] - [place], [place] - [address] AT [facility] [address] Phone: [phone]  Fax: [phone]     Has the patient been seen for an appointment in the last year OR does the patient have an upcoming appointment? No.  Agent: Please be advised that RX refills may take up to 3 business days. We ask that you follow-up with your pharmacy.

## 2022-08-22 NOTE — Progress Notes (Signed)
[address]       Thomas [name] [address]             [phone]     HPI: Thomas [name] returns for a scheduled follow-up visit regarding his ascending aneurysm.  Thomas [name] is a 67 year old Asian gentleman with a history of ASCVD, thoracic aortic atherosclerosis, ascending thoracic aneurysm, right MCA occlusion, stroke, repair of ruptured abdominal aortic aneurysm, PAD, CKD, hypertension, hyperlipidemia, tobacco abuse (quit 2019), and interstitial lung disease.  I last saw him in September 2021.  He had a 4.2 cm ascending aneurysm.  He saw Jari Favre in October 2022.  CT reported a 4.5 cm aneurysm.  He missed him in December 2023.  He had a follow-up CT in January but only now is coming to the office.  He says a few weeks ago he felt ill.  He went to soreness across his entire chest and upper abdomen.  It lasted about 3 days and ultimately resolved.  No anginal type chest pain. Past Medical History:  Diagnosis Date   Chronic kidney disease    GERD (gastroesophageal reflux disease)    History of kidney stones    Hyperlipidemia    Hypertension    Occlusion of right middle cerebral artery    PVD (peripheral vascular disease) (HCC)    Ruptured abdominal aortic aneurysm (AAA) (HCC)    Stroke (HCC) 06/2017   Thoracic aortic aneurysm (TAA) (HCC)     Current Outpatient Medications  Medication Sig Dispense Refill   amLODipine (NORVASC) 5 MG tablet TAKE 1 TABLET(5 MG) BY MOUTH DAILY 90 tablet 1   atorvastatin (LIPITOR) 20 MG tablet Take 1 tablet (20 mg total) by mouth daily. 90 tablet 1   omeprazole (PRILOSEC) 20 MG capsule Take 1 capsule (20 mg total) by mouth daily. 90 capsule 2   valsartan (DIOVAN) 40 MG tablet Take 1 tablet (40 mg total) by mouth daily. 90 tablet 1   No current facility-administered medications for this visit.    Physical Exam BP 111/60   Pulse 69   Resp 20   Ht 5\' 7"  (1.702 m)   Wt 140 lb (63.5 kg)   SpO2 99% Comment: RA  BMI 21.37 kg/m   67 year old Asian man in no acute distress Alert and oriented x 3 with no focal deficits Slow-paced slightly unsteady gait Lungs clear bilaterally Cardiac regular rate and rhythm normal S1-S2 no rubs or murmurs No peripheral edema  Diagnostic Tests: CT CHEST WITHOUT CONTRAST   TECHNIQUE: Multidetector CT imaging of the chest was performed following the standard protocol without IV contrast.   RADIATION DOSE REDUCTION: This exam was performed according to the departmental dose-optimization program which includes automated exposure control, adjustment of the mA and/or kV according to patient size and/or use of iterative reconstruction technique.   COMPARISON:  01/07/2021 and previous   FINDINGS: Cardiovascular: Heart size normal. No pericardial effusion. Central pulmonary arteries normal in caliber. Moderate scattered calcified coronary calcifications. Scattered calcified atheromatous plaque throughout the visualized thoracic aorta.   Aortic Root:   --Sinuses: 3.8 cm   --Sinotubular Junction: 3.3 cm   Limitations by motion: Mild   Thoracic Aorta:   --Ascending Aorta: 4.6 cm (previously 4.5)   --Aortic Arch: 3.9 cm   --Descending Aorta: 3.4 cm   Mediastinum/Nodes: No hematoma, mass or adenopathy.   Lungs/Pleura: No pleural effusion. No pneumothorax. Pulmonary emphysema most severe in the apices. Subpleural honeycombing and scarring in the lung bases with bronchiectasis.  Upper Abdomen: Partially calcified gallstones measure up to 2.7 cm in nondilated gallbladder. 1.5 cm 39 HU left adrenal nodule, stable since 07/03/2017 consistent with benign adenoma; no follow-up recommended. No acute findings.   Musculoskeletal: Spondylitic changes in the in the visualized lower cervical spine. No acute findings.   IMPRESSION: 1. 4.6 cm ascending thoracic aortic aneurysm, previously 4.5cm. Recommend semi-annual imaging followup by CTA or MRA and referral to cardiothoracic  surgery if not already obtained. This recommendation follows 2010 ACCF/AHA/AATS/ACR/ASA/SCA/SCAI/SIR/STS/SVM Guidelines for the Diagnosis and Management of Patients With Thoracic Aortic Disease. Circulation. 2010; 121: U981-X914 2. Cholelithiasis. 3. Aortic Atherosclerosis (ICD10-I70.0) and Emphysema (ICD10-J43.9).     Electronically Signed   By: Thomas [name] M.D.   On: 03/31/2022 13:21 I personally reviewed the CT images.  4.6 cm ascending aneurysm.  Aortic atherosclerosis.  Emphysema.  Gallstones.  Impression: Thomas [name] is a 67 year old Asian gentleman with a history of ASCVD, thoracic aortic atherosclerosis, ascending thoracic aneurysm, right MCA occlusion, stroke, repair of ruptured abdominal aortic aneurysm, PAD, CKD, hypertension, hyperlipidemia, tobacco abuse (quit 2019), and interstitial lung disease.  Ascending aneurysm-measured 4.6 cm on most recent exam.  Needs semiannual follow-up.  Previous scan was done in January so will plan to see him back in about 3 months with a repeat CT chest.  Tobacco abuse-quit smoking 2019.  Hypertension-blood pressure well-controlled on current regimen.  Hyperlipidemia-on Lipitor.   Plan: Return in 3 months with CT chest to follow-up ascending aneurysm  Loreli Slot, MD [facility] [phone]

## 2022-08-23 MED ORDER — ATORVASTATIN CALCIUM 20 MG PO TABS: 20.0000 mg | ORAL_TABLET | Freq: Every day | ORAL | 1 refills | Status: DC

## 2022-08-23 NOTE — Telephone Encounter (Signed)
Requested Prescriptions  Pending Prescriptions Disp Refills   atorvastatin (LIPITOR) 20 MG tablet 90 tablet 1    Sig: Take 1 tablet (20 mg total) by mouth daily.     Cardiovascular:  Antilipid - Statins Failed - 08/22/2022 11:29 AM      Failed - Lipid Panel in normal range within the last 12 months    Cholesterol, Total  Date Value Ref Range Status  12/21/2021 150 100 - 199 mg/dL Final   LDL Chol Calc (NIH)  Date Value Ref Range Status  12/21/2021 80 0 - 99 mg/dL Final   HDL  Date Value Ref Range Status  12/21/2021 43 >39 mg/dL Final   Triglycerides  Date Value Ref Range Status  12/21/2021 156 (H) 0 - 149 mg/dL Final         Passed - Patient is not pregnant      Passed - Valid encounter within last 12 months    Recent Outpatient Visits           3 months ago Primary hypertension   [place] [facility] [place], Shea Stakes, NP   8 months ago Primary hypertension   [place] [facility], Shea Stakes, NP   1 year ago Essential hypertension   [place] [facility] [place], Shea Stakes, NP   1 year ago Essential hypertension   [place] [facility] [place], Shea Stakes, NP   1 year ago Essential hypertension   [place] [facility] Claiborne Rigg, NP       Future Appointments             In 2 months Claiborne Rigg, NP [facility]

## 2022-11-06 ENCOUNTER — Ambulatory Visit: Payer: PPO | Admitting: Nurse Practitioner

## 2022-11-15 ENCOUNTER — Other Ambulatory Visit: Payer: Self-pay | Admitting: Pharmacist

## 2022-11-15 DIAGNOSIS — R7303 Prediabetes: Secondary | ICD-10-CM

## 2022-11-15 DIAGNOSIS — I10 Essential (primary) hypertension: Secondary | ICD-10-CM

## 2022-11-15 DIAGNOSIS — Z79899 Other long term (current) drug therapy: Secondary | ICD-10-CM

## 2022-11-15 MED ORDER — VALSARTAN 40 MG PO TABS: 40.0000 mg | ORAL_TABLET | Freq: Every day | ORAL | 1 refills | Status: DC

## 2022-11-15 MED ORDER — AMLODIPINE BESYLATE 5 MG PO TABS: 5.0000 mg | ORAL_TABLET | Freq: Every day | ORAL | 1 refills | Status: DC

## 2022-11-15 MED ORDER — ATORVASTATIN CALCIUM 20 MG PO TABS: 20.0000 mg | ORAL_TABLET | Freq: Every day | ORAL | 1 refills | Status: DC

## 2022-11-15 NOTE — Progress Notes (Signed)
Patient ID: Thomas [name], male   DOB: 1955/08/20, 67 y.o.   MRN: [id number]  Pharmacy Quality Measure Review  This patient is appearing on a report for being at risk of failing the adherence measure for cholesterol (statin) medications this calendar year.   Medication: atorvastatin Last fill date: 08/23/22 for 90 day supply  Of note, patient is also on valsartan and will be due for a refill next month. This and atorvastatin refill sent. MyChart message sent to patient encouraging adherence.     ,Butch Penny, PharmD, Patsy Baltimore, CPP Clinical Pharmacist [facility] [phone]

## 2022-11-21 ENCOUNTER — Ambulatory Visit: Payer: PPO | Admitting: Thoracic Surgery (Cardiothoracic Vascular Surgery)

## 2022-12-06 ENCOUNTER — Ambulatory Visit
Admission: RE | Admit: 2022-12-06 | Discharge: 2022-12-06 | Disposition: A | Discharge status: Home / Self Care | Payer: PPO | Source: Ambulatory Visit | Attending: Thoracic Surgery (Cardiothoracic Vascular Surgery) | Admitting: Thoracic Surgery (Cardiothoracic Vascular Surgery)

## 2022-12-06 DIAGNOSIS — I7121 Aneurysm of the ascending aorta, without rupture: Secondary | ICD-10-CM

## 2022-12-06 DIAGNOSIS — J849 Interstitial pulmonary disease, unspecified: Secondary | ICD-10-CM | POA: Diagnosis not present

## 2022-12-06 DIAGNOSIS — I700 Atherosclerosis of aorta: Secondary | ICD-10-CM | POA: Diagnosis not present

## 2022-12-06 DIAGNOSIS — R0602 Shortness of breath: Secondary | ICD-10-CM | POA: Diagnosis not present

## 2022-12-11 ENCOUNTER — Encounter: Payer: Self-pay | Admitting: Nurse Practitioner

## 2022-12-11 ENCOUNTER — Ambulatory Visit: Payer: PPO | Attending: Nurse Practitioner | Admitting: Nurse Practitioner

## 2022-12-11 VITALS — BP 114/75 | HR 70 | Ht 67.0 in | Wt 145.6 lb

## 2022-12-11 DIAGNOSIS — K219 Gastro-esophageal reflux disease without esophagitis: Secondary | ICD-10-CM

## 2022-12-11 DIAGNOSIS — Z23 Encounter for immunization: Secondary | ICD-10-CM

## 2022-12-11 DIAGNOSIS — R7303 Prediabetes: Secondary | ICD-10-CM | POA: Diagnosis not present

## 2022-12-11 DIAGNOSIS — Z0000 Encounter for general adult medical examination without abnormal findings: Secondary | ICD-10-CM

## 2022-12-11 DIAGNOSIS — I10 Essential (primary) hypertension: Secondary | ICD-10-CM | POA: Diagnosis not present

## 2022-12-11 MED ORDER — OMEPRAZOLE 20 MG PO CPDR
20.0000 mg | DELAYED_RELEASE_CAPSULE | Freq: Every day | ORAL | 2 refills | Status: DC
Start: 2022-12-11 — End: 2023-11-06

## 2022-12-11 NOTE — Progress Notes (Signed)
Assessment & Plan:  Thomas [name] was seen today for medical management of chronic issues.  Diagnoses and all orders for this visit:  Encounter for annual physical exam  Primary hypertension -     CMP14+EGFR Continue all antihypertensives as prescribed.  Reminded to bring in blood pressure log for follow  up appointment.  RECOMMENDATIONS: DASH/Mediterranean Diets are healthier choices for HTN.    Prediabetes Well controlled  A1c at goal -     Hemoglobin A1c  GERD without esophagitis -     omeprazole (PRILOSEC) 20 MG capsule; Take 1 capsule (20 mg total) by mouth daily. INSTRUCTIONS: Avoid GERD Triggers: acidic, spicy or fried foods, caffeine, coffee, sodas,  alcohol and chocolate.      Patient has been counseled on age-appropriate routine health concerns for screening and prevention. These are reviewed and up-to-date. Referrals have been placed accordingly. Immunizations are up-to-date or declined.    Subjective:   Chief Complaint  Patient presents with   Medical Management of Chronic Issues   HPI Thomas [name] 67 y.o. male presents to office today for follow up to HTN'  He is accompanied by his daughter today.   He has a past medical history of Chronic kidney disease, GERD, History of kidney stones, Hyperlipidemia, Hypertension, Occlusion of right middle cerebral artery PVD, Ruptured abdominal aortic aneurysm, Stroke (06/2017), and Thoracic aortic aneurysm (FOLLOWED BY CTS)     HTN Blood pressure is well controlled today.  He is taking amlodipine and valsartan 40 mg daily as prescribed.  BP Readings from Last 3 Encounters:  12/11/22 114/75  08/22/22 111/60  05/05/22 109/70     Review of Systems  Constitutional:  Negative for fever, malaise/fatigue and weight loss.  HENT: Negative.  Negative for nosebleeds.   Eyes: Negative.  Negative for blurred vision, double vision and photophobia.  Respiratory: Negative.  Negative for cough and shortness of breath.   Cardiovascular:  Negative.  Negative for chest pain, palpitations and leg swelling.  Gastrointestinal: Negative.  Negative for heartburn, nausea and vomiting.  Genitourinary: Negative.   Musculoskeletal: Negative.  Negative for myalgias.  Skin: Negative.   Neurological: Negative.  Negative for dizziness, focal weakness, seizures and headaches.  Endo/Heme/Allergies: Negative.   Psychiatric/Behavioral: Negative.  Negative for suicidal ideas.     Past Medical History:  Diagnosis Date   Chronic kidney disease    GERD (gastroesophageal reflux disease)    History of kidney stones    Hyperlipidemia    Hypertension    Occlusion of right middle cerebral artery    PVD (peripheral vascular disease) (HCC)    Ruptured abdominal aortic aneurysm (AAA) (HCC)    Stroke (HCC) 06/2017   Thoracic aortic aneurysm (TAA) (HCC)     Past Surgical History:  Procedure Laterality Date   CHOLECYSTECTOMY     COLONOSCOPY  2010   Dr.Hung   ENDOVASCULAR STENT INSERTION N/A 07/01/2017   Procedure: ENDOVASCULAR STENT GRAFT INSERTION;  Surgeon: Nada Libman, MD;  Location: [facility] OR;  Service: Vascular;  Laterality: N/A;   THROMBECTOMY FEMORAL ARTERY Bilateral 07/01/2017   Procedure: Bilateral IlioFemoral Thrombectomies;  Surgeon: Nada Libman, MD;  Location: [facility] OR;  Service: Vascular;  Laterality: Bilateral;    Family History  Family history unknown: Yes    Social History Reviewed with no changes to be made today.   Outpatient Medications Prior to Visit  Medication Sig Dispense Refill   amLODipine (NORVASC) 5 MG tablet Take 1 tablet (5 mg total) by mouth daily. 90 tablet 1  atorvastatin (LIPITOR) 20 MG tablet Take 1 tablet (20 mg total) by mouth daily. 90 tablet 1   valsartan (DIOVAN) 40 MG tablet Take 1 tablet (40 mg total) by mouth daily. 90 tablet 1   omeprazole (PRILOSEC) 20 MG capsule Take 1 capsule (20 mg total) by mouth daily. 90 capsule 2   No facility-administered medications prior to visit.    No Known  Allergies     Objective:    BP 114/75 (BP Location: Left Arm, Patient Position: Sitting, Cuff Size: Normal)   Pulse 70   Ht 5\' 7"  (1.702 m)   Wt 145 lb 9.6 oz (66 kg)   SpO2 98%   BMI 22.80 kg/m  Wt Readings from Last 3 Encounters:  12/11/22 145 lb 9.6 oz (66 kg)  08/22/22 140 lb (63.5 kg)  06/02/22 144 lb (65.3 kg)    Physical Exam Constitutional:      Appearance: He is well-developed.  HENT:     Head: Normocephalic and atraumatic.     Right Ear: Hearing, tympanic membrane, ear canal and external ear normal.     Left Ear: Hearing, tympanic membrane, ear canal and external ear normal.     Nose: Nose normal. No mucosal edema or rhinorrhea.     Right Turbinates: Not enlarged.     Left Turbinates: Not enlarged.     Mouth/Throat:     Lips: Pink.     Mouth: Mucous membranes are moist.     Dentition: No gingival swelling, dental abscesses or gum lesions.     Pharynx: Uvula midline.     Tonsils: No tonsillar exudate. 1+ on the right. 1+ on the left.  Eyes:     General: Lids are normal. No scleral icterus.    Extraocular Movements: Extraocular movements intact.     Conjunctiva/sclera: Conjunctivae normal.     Pupils: Pupils are equal, round, and reactive to light.  Neck:     Thyroid: No thyromegaly.     Trachea: No tracheal deviation.  Cardiovascular:     Rate and Rhythm: Normal rate and regular rhythm.     Heart sounds: Normal heart sounds. No murmur heard.    No friction rub. No gallop.  Pulmonary:     Effort: Pulmonary effort is normal. No respiratory distress.     Breath sounds: Normal breath sounds. No wheezing or rales.  Chest:     Chest wall: No mass or tenderness.  Breasts:    Right: No inverted nipple, mass, nipple discharge, skin change or tenderness.     Left: No inverted nipple, mass, nipple discharge, skin change or tenderness.  Abdominal:     General: Bowel sounds are normal. There is no distension.     Palpations: Abdomen is soft. There is no mass.      Tenderness: There is no abdominal tenderness. There is no guarding or rebound.  Musculoskeletal:        General: No tenderness or deformity. Normal range of motion.     Cervical back: Normal range of motion and neck supple.  Lymphadenopathy:     Cervical: No cervical adenopathy.  Skin:    General: Skin is warm and dry.     Capillary Refill: Capillary refill takes less than 2 seconds.     Findings: No erythema.  Neurological:     Mental Status: He is alert and oriented to person, place, and time.     Cranial Nerves: No cranial nerve deficit.     Sensory: Sensation is intact.  Motor: No abnormal muscle tone.     Coordination: Coordination is intact. Coordination normal.     Gait: Gait is intact.     Deep Tendon Reflexes: Reflexes normal.     Reflex Scores:      Patellar reflexes are 1+ on the right side and 1+ on the left side. Psychiatric:        Attention and Perception: Attention normal.        Mood and Affect: Mood normal.        Speech: Speech normal.        Behavior: Behavior normal.        Thought Content: Thought content normal.        Judgment: Judgment normal.          Patient has been counseled extensively about nutrition and exercise as well as the importance of adherence with medications and regular follow-up. The patient was given clear instructions to go to ER or return to medical center if symptoms don't improve, worsen or new problems develop. The patient verbalized understanding.   Follow-up: Return in about 6 months (around 06/10/2023).   Claiborne Rigg, FNP-BC [facility] [place], [place] [phone]   12/11/2022, 3:59 PM

## 2022-12-12 LAB — CMP14+EGFR
ALT: 14 IU/L (ref 0–44)
AST: 18 IU/L (ref 0–40)
Albumin: 4.3 g/dL (ref 3.9–4.9)
Alkaline Phosphatase: 103 IU/L (ref 44–121)
BUN/Creatinine Ratio: 15 (ref 10–24)
BUN: 24 mg/dL (ref 8–27)
Bilirubin Total: 0.4 mg/dL (ref 0.0–1.2)
CO2: 19 mmol/L — ABNORMAL LOW (ref 20–29)
Calcium: 9.4 mg/dL (ref 8.6–10.2)
Chloride: 104 mmol/L (ref 96–106)
Creatinine, Ser: 1.65 mg/dL — ABNORMAL HIGH (ref 0.76–1.27)
Globulin, Total: 3.5 g/dL (ref 1.5–4.5)
Glucose: 92 mg/dL (ref 70–99)
Potassium: 4.5 mmol/L (ref 3.5–5.2)
Sodium: 139 mmol/L (ref 134–144)
Total Protein: 7.8 g/dL (ref 6.0–8.5)
eGFR: 45 mL/min/{1.73_m2} — ABNORMAL LOW (ref >59)

## 2022-12-12 LAB — HEMOGLOBIN A1C
Est. average glucose Bld gHb Est-mCnc: 123 mg/dL
Hgb A1c MFr Bld: 5.9 % — ABNORMAL HIGH (ref 4.8–5.6)

## 2022-12-19 ENCOUNTER — Encounter: Payer: Self-pay | Admitting: Thoracic Surgery (Cardiothoracic Vascular Surgery)

## 2022-12-19 ENCOUNTER — Other Ambulatory Visit: Payer: Self-pay | Admitting: Thoracic Surgery (Cardiothoracic Vascular Surgery)

## 2022-12-19 ENCOUNTER — Ambulatory Visit (INDEPENDENT_AMBULATORY_CARE_PROVIDER_SITE_OTHER): Payer: PPO | Admitting: Thoracic Surgery (Cardiothoracic Vascular Surgery)

## 2022-12-19 VITALS — BP 98/66 | HR 94 | Resp 20 | Ht 67.0 in | Wt 145.0 lb

## 2022-12-19 DIAGNOSIS — I712 Thoracic aortic aneurysm, without rupture, unspecified: Secondary | ICD-10-CM | POA: Diagnosis not present

## 2022-12-19 NOTE — Progress Notes (Signed)
[address]       [place] [address]             [phone]     HPI: [name] returns for follow-up of his ascending aneurysm.  Thomas [name] is a 67 year old Asian man with a history of atherosclerotic cardiovascular disease, thoracic aortic atherosclerosis, ascending thoracic aneurysm, right MCA occlusion, stroke, repair of ruptured AAA, PAD, CKD, hypertension, hyperlipidemia, tobacco abuse, and interstitial lung disease.  He has been followed for several years for an ascending aneurysm which has very slowly increased in size.  I last saw him in May 2024 and the aneurysm was 4.6 cm.  He says he feels poorly today.  He complains of nausea.  He has not eaten anything today.  No chest pain, pressure, or tightness.  Accompanied by his granddaughter.  Past Medical History:  Diagnosis Date   Chronic kidney disease    GERD (gastroesophageal reflux disease)    History of kidney stones    Hyperlipidemia    Hypertension    Occlusion of right middle cerebral artery    PVD (peripheral vascular disease) (HCC)    Ruptured abdominal aortic aneurysm (AAA) (HCC)    Stroke (HCC) 06/2017   Thoracic aortic aneurysm (TAA) (HCC)     Current Outpatient Medications  Medication Sig Dispense Refill   amLODipine (NORVASC) 5 MG tablet Take 1 tablet (5 mg total) by mouth daily. 90 tablet 1   atorvastatin (LIPITOR) 20 MG tablet Take 1 tablet (20 mg total) by mouth daily. 90 tablet 1   omeprazole (PRILOSEC) 20 MG capsule Take 1 capsule (20 mg total) by mouth daily. 90 capsule 2   valsartan (DIOVAN) 40 MG tablet Take 1 tablet (40 mg total) by mouth daily. 90 tablet 1   No current facility-administered medications for this visit.    Physical Exam BP 98/66 (BP Location: Right Arm, Patient Position: Sitting, Cuff Size: Normal)   Pulse 94   Resp 20   Ht 5\' 7"  (1.702 m)   Wt 145 lb (65.8 kg)   SpO2 97% Comment: RA  BMI 22.71 kg/m  Initial BP 79/56, 98/66 on repeat 67 year old Asian  man in no acute distress but appears ill Cardiac regular rate and rhythm with a normal S1 and S2 with no murmur Lungs crackles at both bases No peripheral edema  Diagnostic Tests:  CT performed 12/06/2022. Not officially read yet  I personally reviewed the CT images.  Findings include thoracic aortic atherosclerosis, 4.6 cm ascending aneurysm, emphysema, peripheral bronchiectasis and honeycombing at bases.  Impression: Thomas [name] is a 67 year old Asian man with a history of atherosclerotic cardiovascular disease, thoracic aortic atherosclerosis, ascending thoracic aneurysm, right MCA occlusion, stroke, repair of ruptured AAA, PAD, CKD, hypertension, hyperlipidemia, tobacco abuse, and interstitial lung disease.  Ascending aneurysm/thoracic aortic atherosclerosis-aneurysm stable at 4.6 cm.  Needs continued semiannual follow-up.  He is on a statin.  Hypertension-on Norvasc and Diovan.  BP is low today.  At has not eaten or had anything to drink.  Does appear little dehydrated.  Encouraged him to drink fluids until his nausea improves.  He does have evidence of interstitial lung disease with bronchiectasis at home and honeycombing.  I do not see any pulmonary function testing in his chart so we will go ahead and order those so we have a baseline.  Will consider referral to pulmonology if significantly decreased.  Plan: Pulmonary function testing Return in 6 months with CT chest without contrast.  Loreli Slot, MD [facility] [phone]

## 2023-01-01 ENCOUNTER — Encounter (HOSPITAL_COMMUNITY): Payer: PPO

## 2023-01-08 ENCOUNTER — Ambulatory Visit (HOSPITAL_COMMUNITY)
Admission: RE | Admit: 2023-01-08 | Discharge: 2023-01-08 | Disposition: A | Discharge status: Home / Self Care | Payer: PPO | Source: Ambulatory Visit | Attending: Thoracic Surgery (Cardiothoracic Vascular Surgery) | Admitting: Thoracic Surgery (Cardiothoracic Vascular Surgery)

## 2023-01-08 DIAGNOSIS — I712 Thoracic aortic aneurysm, without rupture, unspecified: Secondary | ICD-10-CM | POA: Insufficient documentation

## 2023-01-08 DIAGNOSIS — J8410 Pulmonary fibrosis, unspecified: Secondary | ICD-10-CM | POA: Diagnosis not present

## 2023-01-08 DIAGNOSIS — Z87891 Personal history of nicotine dependence: Secondary | ICD-10-CM | POA: Insufficient documentation

## 2023-01-08 LAB — PULMONARY FUNCTION TEST
DL/VA % pred: 89 %
DL/VA: 3.67 ml/min/mmHg/L
DLCO unc % pred: 59 %
DLCO unc: 14.24 ml/min/mmHg
FEF 25-75 Post: 0.92 L/s
FEF 25-75 Pre: 0.82 L/s
FEF2575-%Change-Post: 12 %
FEF2575-%Pred-Post: 40 %
FEF2575-%Pred-Pre: 36 %
FEV1-%Change-Post: 16 %
FEV1-%Pred-Post: 60 %
FEV1-%Pred-Pre: 51 %
FEV1-Post: 1.76 L
FEV1-Pre: 1.51 L
FEV1FVC-%Change-Post: 20 %
FEV1FVC-%Pred-Pre: 67 %
FEV6-%Change-Post: -2 %
FEV6-%Pred-Post: 77 %
FEV6-%Pred-Pre: 79 %
FEV6-Post: 2.89 L
FEV6-Pre: 2.98 L
FEV6FVC-%Change-Post: 0 %
FEV6FVC-%Pred-Post: 105 %
FEV6FVC-%Pred-Pre: 104 %
FVC-%Change-Post: -3 %
FVC-%Pred-Post: 73 %
FVC-%Pred-Pre: 76 %
FVC-Post: 2.93 L
FVC-Pre: 3.03 L
Post FEV1/FVC ratio: 60 %
Post FEV6/FVC ratio: 99 %
Pre FEV1/FVC ratio: 50 %
Pre FEV6/FVC Ratio: 98 %
RV % pred: 81 %
RV: 1.85 L
TLC % pred: 76 %
TLC: 4.88 L

## 2023-01-08 MED ORDER — ALBUTEROL SULFATE (2.5 MG/3ML) 0.083% IN NEBU
2.5000 mg | INHALATION_SOLUTION | Freq: Once | RESPIRATORY_TRACT | Status: AC
  Administered 2023-01-08: 2.5 mg via RESPIRATORY_TRACT

## 2023-01-10 ENCOUNTER — Ambulatory Visit: Payer: PPO | Admitting: Nurse Practitioner

## 2023-03-25 ENCOUNTER — Other Ambulatory Visit: Payer: Self-pay | Admitting: Family Medicine

## 2023-03-25 DIAGNOSIS — R7303 Prediabetes: Secondary | ICD-10-CM

## 2023-03-25 DIAGNOSIS — I10 Essential (primary) hypertension: Secondary | ICD-10-CM

## 2023-05-01 ENCOUNTER — Other Ambulatory Visit: Payer: Self-pay | Admitting: Thoracic Surgery (Cardiothoracic Vascular Surgery)

## 2023-05-01 DIAGNOSIS — I712 Thoracic aortic aneurysm, without rupture, unspecified: Secondary | ICD-10-CM

## 2023-05-08 ENCOUNTER — Encounter: Payer: Self-pay | Admitting: Nurse Practitioner

## 2023-05-08 ENCOUNTER — Telehealth: Payer: Self-pay | Admitting: *Deleted

## 2023-05-08 NOTE — Telephone Encounter (Signed)
Copied from CRM [id number]. Topic: General - Other >> May 08, 2023  9:18 AM Thomas [name] wrote: Reason for CRM: pt received a letter for jury summons.  Daughter Thomas [name] calling for the pt to ask the dr to write a letter to excuse pt.  Juror number [phone] [place] [address] Date to be there  March 24 Must include name and DOB  Please mail to home address Call back if any questions [phone]

## 2023-05-08 NOTE — Telephone Encounter (Signed)
Letter is in [place].

## 2023-05-10 NOTE — Telephone Encounter (Signed)
Unable to reach patient by phone to relay response.  Detailed voicemail left with response and offering letter to be printed and picked up from the front desk.

## 2023-05-16 ENCOUNTER — Other Ambulatory Visit: Payer: Self-pay | Admitting: Family Medicine

## 2023-06-11 ENCOUNTER — Ambulatory Visit: Payer: PPO | Admitting: Nurse Practitioner

## 2023-06-18 ENCOUNTER — Other Ambulatory Visit: Payer: Self-pay | Admitting: Family Medicine

## 2023-06-18 DIAGNOSIS — I10 Essential (primary) hypertension: Secondary | ICD-10-CM

## 2023-06-18 DIAGNOSIS — R7303 Prediabetes: Secondary | ICD-10-CM

## 2023-06-19 ENCOUNTER — Other Ambulatory Visit: Payer: Self-pay | Admitting: Family Medicine

## 2023-06-19 ENCOUNTER — Other Ambulatory Visit: Payer: PPO

## 2023-06-19 DIAGNOSIS — R7303 Prediabetes: Secondary | ICD-10-CM

## 2023-06-19 DIAGNOSIS — I10 Essential (primary) hypertension: Secondary | ICD-10-CM

## 2023-06-19 MED ORDER — VALSARTAN 40 MG PO TABS
40.0000 mg | ORAL_TABLET | Freq: Every day | ORAL | 0 refills | Status: DC
Start: 2023-06-19 — End: 2023-07-16

## 2023-06-26 ENCOUNTER — Ambulatory Visit: Payer: PPO | Admitting: Thoracic Surgery (Cardiothoracic Vascular Surgery)

## 2023-06-27 ENCOUNTER — Ambulatory Visit: Admitting: Nurse Practitioner

## 2023-07-16 ENCOUNTER — Other Ambulatory Visit: Payer: Self-pay | Admitting: Family Medicine

## 2023-07-16 DIAGNOSIS — R7303 Prediabetes: Secondary | ICD-10-CM

## 2023-07-16 DIAGNOSIS — I10 Essential (primary) hypertension: Secondary | ICD-10-CM

## 2023-07-16 MED ORDER — ATORVASTATIN CALCIUM 20 MG PO TABS: 20.0000 mg | ORAL_TABLET | Freq: Every day | ORAL | 0 refills | Status: DC

## 2023-07-16 MED ORDER — VALSARTAN 40 MG PO TABS: 40.0000 mg | ORAL_TABLET | Freq: Every day | ORAL | 0 refills | Status: DC

## 2023-07-18 ENCOUNTER — Encounter: Payer: Self-pay | Admitting: Nurse Practitioner

## 2023-07-18 ENCOUNTER — Ambulatory Visit: Attending: Nurse Practitioner | Admitting: Nurse Practitioner

## 2023-07-18 ENCOUNTER — Ambulatory Visit: Admitting: Nurse Practitioner

## 2023-07-18 VITALS — BP 99/72 | HR 93 | Resp 19 | Ht 67.0 in | Wt 148.0 lb

## 2023-07-18 DIAGNOSIS — R7989 Other specified abnormal findings of blood chemistry: Secondary | ICD-10-CM | POA: Diagnosis not present

## 2023-07-18 DIAGNOSIS — R7303 Prediabetes: Secondary | ICD-10-CM

## 2023-07-18 DIAGNOSIS — I10 Essential (primary) hypertension: Secondary | ICD-10-CM | POA: Diagnosis not present

## 2023-07-18 LAB — POCT GLYCOSYLATED HEMOGLOBIN (HGB A1C): HbA1c, POC (controlled diabetic range): 6.6 % (ref 0.0–7.0)

## 2023-07-18 NOTE — Progress Notes (Signed)
 [place]

## 2023-07-18 NOTE — Progress Notes (Signed)
 Assessment & Plan:  Thomas [name] was seen today for medical management of chronic issues.  Diagnoses and all orders for this visit:  Prediabetes -     POCT glycosylated hemoglobin (Hb A1C) 6.6 Recommended DASH/Mediterranean diet. Reduce sugary drinks. Recommended Glucerna.   Primary hypertension -     CMP14+EGFR Amlodipine  5 mg every day Valsartan  40 mg every day Continue to monitor your blood pressure at home.  Continue all antihypertensives as prescribed.  Reminded to bring in blood pressure log for follow  up appointment.  RECOMMENDATIONS: DASH/Mediterranean Diets are healthier choices for HTN.    Abnormal CBC -     CBC with Differential    Patient has been counseled on age-appropriate routine health concerns for screening and prevention. These are reviewed and up-to-date. Referrals have been placed accordingly. Immunizations are up-to-date or declined.    Subjective:   Chief Complaint  Patient presents with   Medical Management of Chronic Issues    Thomas [name] 68 y.o. male presents to office today for follow up on HTN and prediabetes. He is accompanied by his daughter who is translating for him  Reports his appetite is poor and drinking Ensure as a meal supplement. A1C elevated at 6.6, from visit on 05/05/2022. Educated provided on reducing or limiting carbohydrates, highly processed and sugary foods. Recommended DASH/Mediterranean diet with fish, chicken and more vegetables. Daughter states she will food prep his meals for him to eat during the week. Patient was eating processed frozen meals. Provided samples of Glucerna to try at home as replacement for Ensure. Has follow up with Cardiologist, Dr Luna Salinas on 08/07/2023. Denies chest pain, shortness of breath or dizziness. Patient checks blood pressure at home.   Lab Results  Component Value Date   HGBA1C 6.6 07/18/2023    BP Readings from Last 3 Encounters:  07/18/23 99/72  12/19/22 98/66  12/11/22 114/75    Lab Results   Component Value Date   LDLCALC 80 12/21/2021       Review of Systems  Constitutional: Negative.   HENT: Negative.    Eyes: Negative.   Respiratory: Negative.    Cardiovascular:  Negative for chest pain and palpitations.  Gastrointestinal:  Negative for heartburn.  Genitourinary: Negative.   Musculoskeletal: Negative.   Skin: Negative.   Neurological:  Negative for dizziness.  Endo/Heme/Allergies: Negative.   Psychiatric/Behavioral: Negative.      Past Medical History:  Diagnosis Date   Chronic kidney disease    GERD (gastroesophageal reflux disease)    History of kidney stones    Hyperlipidemia    Hypertension    Occlusion of right middle cerebral artery    PVD (peripheral vascular disease) (HCC)    Ruptured abdominal aortic aneurysm (AAA) (HCC)    Stroke (HCC) 06/2017   Thoracic aortic aneurysm (TAA) (HCC)     Past Surgical History:  Procedure Laterality Date   CHOLECYSTECTOMY     COLONOSCOPY  2010   Dr.Hung   ENDOVASCULAR STENT INSERTION N/A 07/01/2017   Procedure: ENDOVASCULAR STENT GRAFT INSERTION;  Surgeon: Margherita Shell, MD;  Location: [facility] OR;  Service: Vascular;  Laterality: N/A;   THROMBECTOMY FEMORAL ARTERY Bilateral 07/01/2017   Procedure: Bilateral IlioFemoral Thrombectomies;  Surgeon: Margherita Shell, MD;  Location: [facility] OR;  Service: Vascular;  Laterality: Bilateral;    Family History  Family history unknown: Yes    Social History Reviewed with no changes to be made today.   Outpatient Medications Prior to Visit  Medication Sig Dispense Refill  amLODipine  (NORVASC ) 5 MG tablet Take 1 tablet (5 mg total) by mouth daily. 90 tablet 1   atorvastatin  (LIPITOR ) 20 MG tablet Take 1 tablet (20 mg total) by mouth daily. 30 tablet 0   omeprazole  (PRILOSEC) 20 MG capsule Take 1 capsule (20 mg total) by mouth daily. 90 capsule 2   valsartan  (DIOVAN ) 40 MG tablet Take 1 tablet (40 mg total) by mouth daily. 30 tablet 0   No facility-administered medications  prior to visit.    No Known Allergies     Objective:    BP 99/72 (BP Location: Left Arm, Patient Position: Sitting, Cuff Size: Normal)   Pulse 93   Resp 19   Ht 5\' 7"  (1.702 m)   Wt 148 lb (67.1 kg)   SpO2 97%   BMI 23.18 kg/m  Wt Readings from Last 3 Encounters:  07/18/23 148 lb (67.1 kg)  12/19/22 145 lb (65.8 kg)  12/11/22 145 lb 9.6 oz (66 kg)    Physical Exam Vitals and nursing note reviewed.  Constitutional:      Appearance: Normal appearance.  Cardiovascular:     Rate and Rhythm: Normal rate and regular rhythm.     Heart sounds: Normal heart sounds.  Pulmonary:     Effort: Pulmonary effort is normal.     Breath sounds: Normal breath sounds.  Musculoskeletal:        General: Normal range of motion.     Cervical back: Normal range of motion.  Skin:    General: Skin is warm and dry.  Neurological:     General: No focal deficit present.     Mental Status: He is alert and oriented to person, place, and time.  Psychiatric:        Attention and Perception: Attention normal.        Mood and Affect: Mood normal.        Speech: Speech normal.        Behavior: Behavior normal.        Thought Content: Thought content normal.        Cognition and Memory: Cognition normal.        Judgment: Judgment normal.          Patient has been counseled extensively about nutrition and exercise as well as the importance of adherence with medications and regular follow-up. The patient was given clear instructions to go to ER or return to medical center if symptoms don't improve, worsen or new problems develop. The patient verbalized understanding.   Follow-up: Return in about 6 months (around 01/17/2024).   Thomas [name] E Thomas [name], BSN RN, student FNP [facility] [place], [place] [phone]   07/18/2023, 12:11 PM

## 2023-07-18 NOTE — Progress Notes (Signed)
 I have seen and examined this patient with the advanced practice provider STUDENT and agree with the note below

## 2023-07-19 ENCOUNTER — Other Ambulatory Visit: Payer: Self-pay | Admitting: Nurse Practitioner

## 2023-07-19 DIAGNOSIS — D72829 Elevated white blood cell count, unspecified: Secondary | ICD-10-CM

## 2023-07-19 LAB — CBC WITH DIFFERENTIAL/PLATELET
Basophils Absolute: 0.2 10*3/uL (ref 0.0–0.2)
Basos: 1 %
EOS (ABSOLUTE): 4.4 10*3/uL — ABNORMAL HIGH (ref 0.0–0.4)
Eos: 27 %
Hematocrit: 48.9 % (ref 37.5–51.0)
Hemoglobin: 15.7 g/dL (ref 13.0–17.7)
Immature Grans (Abs): 0 10*3/uL (ref 0.0–0.1)
Immature Granulocytes: 0 %
Lymphocytes Absolute: 4.3 10*3/uL — ABNORMAL HIGH (ref 0.7–3.1)
Lymphs: 26 %
MCH: 25.4 pg — ABNORMAL LOW (ref 26.6–33.0)
MCHC: 32.1 g/dL (ref 31.5–35.7)
MCV: 79 fL (ref 79–97)
Monocytes Absolute: 1 10*3/uL — ABNORMAL HIGH (ref 0.1–0.9)
Monocytes: 6 %
Neutrophils Absolute: 6.4 10*3/uL (ref 1.4–7.0)
Neutrophils: 40 %
Platelets: 407 10*3/uL (ref 150–450)
RBC: 6.19 x10E6/uL — ABNORMAL HIGH (ref 4.14–5.80)
RDW: 15.8 % — ABNORMAL HIGH (ref 11.6–15.4)
WBC: 16.4 10*3/uL — ABNORMAL HIGH (ref 3.4–10.8)

## 2023-07-19 LAB — CMP14+EGFR
ALT: 16 IU/L (ref 0–44)
AST: 24 IU/L (ref 0–40)
Albumin: 4.2 g/dL (ref 3.9–4.9)
Alkaline Phosphatase: 146 IU/L — ABNORMAL HIGH (ref 44–121)
BUN/Creatinine Ratio: 10 (ref 10–24)
BUN: 17 mg/dL (ref 8–27)
Bilirubin Total: 0.6 mg/dL (ref 0.0–1.2)
CO2: 21 mmol/L (ref 20–29)
Calcium: 9.7 mg/dL (ref 8.6–10.2)
Chloride: 102 mmol/L (ref 96–106)
Creatinine, Ser: 1.72 mg/dL — ABNORMAL HIGH (ref 0.76–1.27)
Globulin, Total: 3.6 g/dL (ref 1.5–4.5)
Glucose: 122 mg/dL — ABNORMAL HIGH (ref 70–99)
Potassium: 4.6 mmol/L (ref 3.5–5.2)
Sodium: 140 mmol/L (ref 134–144)
Total Protein: 7.8 g/dL (ref 6.0–8.5)
eGFR: 42 mL/min/{1.73_m2} — ABNORMAL LOW (ref >59)

## 2023-07-23 ENCOUNTER — Ambulatory Visit
Admission: RE | Admit: 2023-07-23 | Discharge: 2023-07-23 | Disposition: A | Discharge status: Home / Self Care | Source: Ambulatory Visit | Attending: Thoracic Surgery (Cardiothoracic Vascular Surgery) | Admitting: Thoracic Surgery (Cardiothoracic Vascular Surgery)

## 2023-07-23 ENCOUNTER — Other Ambulatory Visit

## 2023-07-23 DIAGNOSIS — I712 Thoracic aortic aneurysm, without rupture, unspecified: Secondary | ICD-10-CM

## 2023-07-23 DIAGNOSIS — R599 Enlarged lymph nodes, unspecified: Secondary | ICD-10-CM | POA: Diagnosis not present

## 2023-07-23 DIAGNOSIS — I2510 Atherosclerotic heart disease of native coronary artery without angina pectoris: Secondary | ICD-10-CM | POA: Diagnosis not present

## 2023-07-23 DIAGNOSIS — J439 Emphysema, unspecified: Secondary | ICD-10-CM | POA: Diagnosis not present

## 2023-07-23 DIAGNOSIS — I7121 Aneurysm of the ascending aorta, without rupture: Secondary | ICD-10-CM | POA: Diagnosis not present

## 2023-07-24 ENCOUNTER — Telehealth: Payer: Self-pay | Admitting: Nurse Practitioner

## 2023-07-24 NOTE — Telephone Encounter (Signed)
 FYI Copied from CRM [id number]. Topic: Clinical - Lab/Test Results  >> Jul 24, 2023 12:05 PM Zipporah Him wrote: Reason for CRM: Read lab results verbatim to patients daughter janette, no further questions. Will reach out to blood referral to get him scheduled.

## 2023-07-24 NOTE — Telephone Encounter (Signed)
 Noted.

## 2023-07-31 ENCOUNTER — Telehealth: Payer: Self-pay | Admitting: Nurse Practitioner

## 2023-07-31 NOTE — Telephone Encounter (Signed)
 Unable to reach patient by phone. Voicemail left to return call to our office. I am trying to confirm that this is the correct referral, patient already has an appointment scheduled on 5/29 with oncology.

## 2023-07-31 NOTE — Telephone Encounter (Signed)
 Copied from CRM [id number]. Topic: Referral - Status  >> Jul 31, 2023 10:06 AM Baldemar Lev wrote: Reason for CRM: Pt has reached out to referral for oncology Leukocytosis and has yet to be scheduled. Pt's daughter jeanette Cashwell says she has called multiple times with no response.   Please advise

## 2023-08-07 ENCOUNTER — Ambulatory Visit: Admitting: Thoracic Surgery (Cardiothoracic Vascular Surgery)

## 2023-08-07 ENCOUNTER — Encounter: Payer: Self-pay | Admitting: Thoracic Surgery (Cardiothoracic Vascular Surgery)

## 2023-08-07 ENCOUNTER — Ambulatory Visit
Attending: Thoracic Surgery (Cardiothoracic Vascular Surgery) | Admitting: Thoracic Surgery (Cardiothoracic Vascular Surgery)

## 2023-08-07 VITALS — BP 109/73 | HR 89 | Resp 20 | Ht 67.0 in | Wt 148.0 lb

## 2023-08-07 DIAGNOSIS — I7121 Aneurysm of the ascending aorta, without rupture: Secondary | ICD-10-CM | POA: Diagnosis not present

## 2023-08-07 NOTE — Progress Notes (Signed)
 [address]       Thomas [name] [address]             [phone]      HPI: [name] returns for follow-up of his ascending aneurysm  Thomas [name] is a 68 year old Asian man with a history of ASCVD, thoracic aortic atherosclerosis, ascending thoracic aneurysm, right MCA occlusion, stroke, ruptured abdominal aneurysm, endovascular repair, PAD, CKD, hypertension, hyperlipidemia, tobacco use (quit 2019) and interstitial lung disease.  Presented with a ruptured aneurysm in April 2019.  He underwent endovascular repair by Dr. Charlotte Cookey.  On CT his was noted to have an ascending aneurysm about 4.5 cm.  I saw him in the office 6 months ago.  Was having some shortness of breath issues at that time.  The aneurysm was stable.  Since his last visit he has been feeling well.  No significant shortness of breath.  Denies chest pain.  Past Medical History:  Diagnosis Date   Chronic kidney disease    GERD (gastroesophageal reflux disease)    History of kidney stones    Hyperlipidemia    Hypertension    Occlusion of right middle cerebral artery    PVD (peripheral vascular disease) (HCC)    Ruptured abdominal aortic aneurysm (AAA) (HCC)    Stroke (HCC) 06/2017   Thoracic aortic aneurysm (TAA) (HCC)     Current Outpatient Medications  Medication Sig Dispense Refill   amLODipine  (NORVASC ) 5 MG tablet Take 1 tablet (5 mg total) by mouth daily. 90 tablet 1   atorvastatin  (LIPITOR ) 20 MG tablet Take 1 tablet (20 mg total) by mouth daily. 30 tablet 0   omeprazole  (PRILOSEC) 20 MG capsule Take 1 capsule (20 mg total) by mouth daily. 90 capsule 2   valsartan  (DIOVAN ) 40 MG tablet Take 1 tablet (40 mg total) by mouth daily. 30 tablet 0   No current facility-administered medications for this visit.    Physical Exam BP 109/73   Pulse 89   Resp 20   Ht 5\' 7"  (1.702 m)   Wt 148 lb (67.1 kg)   SpO2 97% Comment: RA  BMI 23.43 kg/m  68 year old man in no acute distress Alert and  oriented x 3 with no focal deficits Lungs faint crackles at bases bilaterally Cardiac regular rate and rhythm, no murmur No peripheral edema Clubbing, no cyanosis  Diagnostic Tests: CT CHEST WITHOUT CONTRAST   TECHNIQUE: Multidetector CT imaging of the chest was performed following the standard protocol without IV contrast.   RADIATION DOSE REDUCTION: This exam was performed according to the departmental dose-optimization program which includes automated exposure control, adjustment of the mA and/or kV according to patient size and/or use of iterative reconstruction technique.   COMPARISON:  Chest CTs without contrast 12/06/2022 and 03/31/2022.   FINDINGS: Cardiovascular: There are moderate patchy mixed aortic Plaques, mild aortic tortuosity.   Normal great vessel branching and scattered calcification in the great vessels.   There are stable proximal aortic measurements of 2.9 cm at the valve plane, 3.2 cm at the sinuses of Valsalva (coronal series 4, image 55), 3.3 cm at the sinotubular junction and 4.4 cm mid ascending segment (both on 4:54).   The cardiac size is normal. Rest of the aorta is within normal caliber limits.   There are left main and three-vessel patchy coronary calcifications. No pericardial effusion. Normal caliber pulmonary arteries and veins.   Mediastinum/Nodes: Single stable mildly prominent right paratracheal chain node 1.3 cm short axis. Stable  1 cm short axis AP window lymph node, both on 2:59.   No further or new adenopathy is seen. There are small calcified prevascular space and subcarinal lymph nodes.   Fever   Unremarkable thyroid gland. Thoracic trachea and thoracic esophagus are unremarkable.   Lungs/Pleura: Moderate paraseptal and centrilobular emphysema.   Superimposed subpleural reticulation, traction bronchiolectasis and interstitial thickening with basal gradient but with some asymmetries, associated subpleural ground-glass  chronic disease and bilateral basilar cylindrical large airway bronchiectasis. Associated subpleural honeycombing over portions.   There are scattered calcified granulomas. Additional scattered linear scar-like opacities both lower lobes.   No lung nodules or infiltrates are seen.  No pleural effusion.   Upper Abdomen: Uncomplicated cholelithiasis. Asymmetric right renal atrophy. Stable 1.5 cm left adrenal nodule.   There is abdominal aortic atherosclerosis. No acute upper abdominal findings.   Musculoskeletal: No chest wall mass or suspicious bone lesions identified.   IMPRESSION: 1. Stable 4.4 cm mid ascending aortic aneurysm. Recommend annual imaging followup by CTA or MRA. This recommendation follows 2010 ACCF/AHA/AATS/ACR/ASA/SCA/SCAI/ SIR/STS/SVM Guidelines for the Diagnosis and Management of Patients with Thoracic Aortic Disease. Circulation. 2010; 121: U132-G401. Aortic aneurysm NOS (ICD10-I71.9). 2. Aortic and coronary artery atherosclerosis. 3. Emphysema with superimposed interstitial lung disease with basal gradient and some asymmetries, with associated subpleural honeycombing over portions. Possible fibrotic NSIP given the asymmetries. No active pneumonia is seen. 4. Stable mildly prominent mediastinal lymph nodes. 5. Cholelithiasis. 6. Stable 1.5 cm left adrenal nodule. 7. Asymmetric right renal atrophy.   Aortic Atherosclerosis (ICD10-I70.0) and Emphysema (ICD10-J43.9).     Electronically Signed   By: Denman Fischer M.D.   On: 07/28/2023 21:18 I personally reviewed the CT images.  Stable ascending aneurysm about 4.4 to 4.5 cm.  Atherosclerosis involving the thoracic aorta and coronaries.  Scarring in the lungs.  Pulmonary function testing FVC 3.03 (76%) FEV1 1.51 (51%) FEV1 1.76 (60%) postbronchodilator DLCO 14.24 (59%)  Impression: Thomas [name] is a 68 year old Asian man with a history of ASCVD, thoracic aortic atherosclerosis, ascending thoracic  aneurysm, right MCA occlusion, stroke, ruptured abdominal aneurysm, endovascular repair, PAD, CKD, hypertension, hyperlipidemia, tobacco use (quit 2019) and interstitial lung disease.  Ascending aneurysm/thoracic aortic atherosclerosis-stable around 4.4 to 4.5 cm.  We have been following on a 67-month basis.  Given its stability over 5 years I think we could safely go to 1 year.  Hypertension-blood pressure well-controlled on Norvasc  and Diovan .  Hyperlipidemia-on Lipitor .  Interstitial lung disease-relatively asymptomatic but would probably be reasonable to refer for pulmonology consult now greater than waiting until he has significant issues.  Will make referral to Dr. Bertrum Brodie  Plan: Referral to Dr. Bertrum Brodie for evaluation of ILD Return in 1 year with CT chest to follow-up ascending aneurysm  Thomas Higashi, MD [facility] [phone]

## 2023-08-15 ENCOUNTER — Other Ambulatory Visit: Payer: Self-pay | Admitting: Family Medicine

## 2023-08-21 ENCOUNTER — Encounter (HOSPITAL_BASED_OUTPATIENT_CLINIC_OR_DEPARTMENT_OTHER): Payer: Self-pay

## 2023-08-21 ENCOUNTER — Telehealth: Payer: Self-pay | Admitting: Hematology and Oncology

## 2023-08-23 ENCOUNTER — Inpatient Hospital Stay: Admitting: Hematology and Oncology

## 2023-08-23 ENCOUNTER — Inpatient Hospital Stay

## 2023-08-29 ENCOUNTER — Telehealth: Payer: Self-pay | Admitting: Hematology and Oncology

## 2023-09-05 ENCOUNTER — Inpatient Hospital Stay: Admitting: Hematology and Oncology

## 2023-09-05 ENCOUNTER — Inpatient Hospital Stay

## 2023-09-05 ENCOUNTER — Other Ambulatory Visit: Payer: Self-pay | Admitting: Family Medicine

## 2023-09-21 ENCOUNTER — Telehealth: Payer: Self-pay | Admitting: Hematology and Oncology

## 2023-09-21 NOTE — Telephone Encounter (Signed)
 Rescheduled appointments per patients daughters request via incoming call. Talked with the patients daughter and she is aware of the changes made to the patients appointments.

## 2023-09-26 ENCOUNTER — Inpatient Hospital Stay

## 2023-09-26 ENCOUNTER — Inpatient Hospital Stay: Admitting: Hematology and Oncology

## 2023-10-15 ENCOUNTER — Ambulatory Visit: Admitting: Internal Medicine

## 2023-10-24 ENCOUNTER — Other Ambulatory Visit: Payer: Self-pay | Admitting: Family Medicine

## 2023-10-24 DIAGNOSIS — I10 Essential (primary) hypertension: Secondary | ICD-10-CM

## 2023-10-31 ENCOUNTER — Inpatient Hospital Stay: Attending: Hematology and Oncology

## 2023-10-31 ENCOUNTER — Inpatient Hospital Stay (HOSPITAL_BASED_OUTPATIENT_CLINIC_OR_DEPARTMENT_OTHER): Admitting: Hematology and Oncology

## 2023-10-31 ENCOUNTER — Other Ambulatory Visit: Payer: Self-pay | Admitting: Hematology and Oncology

## 2023-10-31 VITALS — BP 95/68 | HR 102 | Temp 97.8°F | Resp 16 | Wt 148.4 lb

## 2023-10-31 DIAGNOSIS — Z87891 Personal history of nicotine dependence: Secondary | ICD-10-CM | POA: Diagnosis not present

## 2023-10-31 DIAGNOSIS — D72820 Lymphocytosis (symptomatic): Secondary | ICD-10-CM | POA: Insufficient documentation

## 2023-10-31 LAB — CBC WITH DIFFERENTIAL (CANCER CENTER ONLY)
Abs Immature Granulocytes: 0.02 K/uL (ref 0.00–0.07)
Basophils Absolute: 0.1 K/uL (ref 0.0–0.1)
Basophils Relative: 1 %
Eosinophils Absolute: 4.2 K/uL — ABNORMAL HIGH (ref 0.0–0.5)
Eosinophils Relative: 30 %
HCT: 41.8 % (ref 39.0–52.0)
Hemoglobin: 14.3 g/dL (ref 13.0–17.0)
Immature Granulocytes: 0 %
Lymphocytes Relative: 35 %
Lymphs Abs: 5 K/uL — ABNORMAL HIGH (ref 0.7–4.0)
MCH: 26 pg (ref 26.0–34.0)
MCHC: 34.2 g/dL (ref 30.0–36.0)
MCV: 76.1 fL — ABNORMAL LOW (ref 80.0–100.0)
Monocytes Absolute: 0.8 K/uL (ref 0.1–1.0)
Monocytes Relative: 6 %
Neutro Abs: 3.9 K/uL (ref 1.7–7.7)
Neutrophils Relative %: 28 %
Platelet Count: 348 K/uL (ref 150–400)
RBC: 5.49 MIL/uL (ref 4.22–5.81)
RDW: 15.4 % (ref 11.5–15.5)
Smear Review: NORMAL
WBC Count: 14 K/uL — ABNORMAL HIGH (ref 4.0–10.5)
nRBC: 0 % (ref 0.0–0.2)

## 2023-10-31 LAB — CMP (CANCER CENTER ONLY)
ALT: 9 U/L (ref 0–44)
AST: 13 U/L — ABNORMAL LOW (ref 15–41)
Albumin: 4.1 g/dL (ref 3.5–5.0)
Alkaline Phosphatase: 113 U/L (ref 38–126)
Anion gap: 8 (ref 5–15)
BUN: 19 mg/dL (ref 8–23)
CO2: 23 mmol/L (ref 22–32)
Calcium: 9.1 mg/dL (ref 8.9–10.3)
Chloride: 108 mmol/L (ref 98–111)
Creatinine: 1.88 mg/dL — ABNORMAL HIGH (ref 0.61–1.24)
GFR, Estimated: 38 mL/min — ABNORMAL LOW (ref >60)
Glucose, Bld: 117 mg/dL — ABNORMAL HIGH (ref 70–99)
Potassium: 3.9 mmol/L (ref 3.5–5.1)
Sodium: 139 mmol/L (ref 135–145)
Total Bilirubin: 0.7 mg/dL (ref 0.0–1.2)
Total Protein: 8 g/dL (ref 6.5–8.1)

## 2023-10-31 LAB — C-REACTIVE PROTEIN: CRP: <0.5 mg/dL (ref <1.0)

## 2023-10-31 LAB — SEDIMENTATION RATE: Sed Rate: 7 mm/h (ref 0–16)

## 2023-10-31 NOTE — Progress Notes (Signed)
 [facility] [phone]   [phone]  PROGRESS NOTE  Patient Care Team: Theotis Haze ORN, NP as PCP - General (Nurse Practitioner) Francyne Headland, MD as PCP - Cardiology (Cardiology)  Hematological/Oncological History # Leukocytosis/Lymphocytosis 11/19/2019: WBC 11.8, Hgb 14.3, MCV 80, Plt 327 01/28/2020: WBC 11.6, Hgb 14.6, MCV 81, Plt 371 12/21/2020: WBC 13.3, Hgb 14.3, MCV 78, Plt 316 01/03/2021: establish care with Dr. Federico. WBC 12.2, Hgb 14.3, MCV 79.6, Plt 318   Interval History:  Thomas [name] 68 y.o. male with medical history significant for leukocytosis presents for a follow up visit. The patient's last visit was on 04/27/2021. In the interim since the last visit he has had no major changes in his health.  On exam today Thomas [name] reports that he is sleepy today and he is unsure why.  He notes that in the interim since her last visit he has had no issues with bone or back pain.  He has been having some tiredness and weakness.  He is not having any lightheadedness but is having some dizziness on occasion.  No shortness of breath.  He reports his appetite can be off-and-on.  His weight has been steady however around the 148 pound mark.  He notes he is had no ER visits or hospitalizations.  Overall he has no focal symptoms such as runny nose, sore throat, cough.  He denies any fevers, chills, sweats.  Full 10 point ROS is otherwise negative.  MEDICAL HISTORY:  Past Medical History:  Diagnosis Date   Chronic kidney disease    GERD (gastroesophageal reflux disease)    History of kidney stones    Hyperlipidemia    Hypertension    Occlusion of right middle cerebral artery    PVD (peripheral vascular disease) (HCC)    Ruptured abdominal aortic aneurysm (AAA) (HCC)    Stroke (HCC) 06/2017   Thoracic aortic aneurysm (TAA) (HCC)     SURGICAL HISTORY: Past Surgical History:  Procedure Laterality Date   CHOLECYSTECTOMY     COLONOSCOPY  2010   Dr.Hung    ENDOVASCULAR STENT INSERTION N/A 07/01/2017   Procedure: ENDOVASCULAR STENT GRAFT INSERTION;  Surgeon: Serene Gaile ORN, MD;  Location: [facility] OR;  Service: Vascular;  Laterality: N/A;   THROMBECTOMY FEMORAL ARTERY Bilateral 07/01/2017   Procedure: Bilateral IlioFemoral Thrombectomies;  Surgeon: Serene Gaile ORN, MD;  Location: [facility] OR;  Service: Vascular;  Laterality: Bilateral;    SOCIAL HISTORY: Social History   Socioeconomic History   Marital status: Divorced    Spouse name: Not on file   Number of children: Not on file   Years of education: Not on file   Highest education level: Not on file  Occupational History   Not on file  Tobacco Use   Smoking status: Former    Current packs/day: 0.00    Types: Cigarettes    Quit date: 07/16/2017    Years since quitting: 6.2   Smokeless tobacco: Never  Vaping Use   Vaping status: Never Used  Substance and Sexual Activity   Alcohol use: No   Drug use: No   Sexual activity: Not Currently  Other Topics Concern   Not on file  Social History Narrative   Not on file   Social Drivers of Health   Financial Resource Strain: Low Risk  (06/02/2022)   Overall Financial Resource Strain (CARDIA)    Difficulty of Paying Living Expenses: Not hard at all  Food Insecurity: No Food Insecurity (07/18/2023)   Hunger Vital  Sign    Worried About Programme researcher, broadcasting/film/video in the Last Year: Never true    Ran Out of Food in the Last Year: Never true  Transportation Needs: No Transportation Needs (07/18/2023)   PRAPARE - Administrator, Civil Service (Medical): No    Lack of Transportation (Non-Medical): No  Physical Activity: Inactive (07/18/2023)   Exercise Vital Sign    Days of Exercise per Week: 0 days    Minutes of Exercise per Session: 0 min  Stress: Stress Concern Present (07/18/2023)   [employer] of Occupational Health - Occupational Stress Questionnaire    Feeling of Stress : Very much  Social Connections: Socially Isolated (07/18/2023)    Social Connection and Isolation Panel    Frequency of Communication with Friends and Family: More than three times a week    Frequency of Social Gatherings with Friends and Family: Twice a week    Attends Religious Services: Never    Database administrator or Organizations: No    Attends Banker Meetings: Never    Marital Status: Divorced  Catering manager Violence: Not At Risk (07/18/2023)   Humiliation, Afraid, Rape, and Kick questionnaire    Fear of Current or Ex-Partner: No    Emotionally Abused: No    Physically Abused: No    Sexually Abused: No    FAMILY HISTORY: Family History  Family history unknown: Yes    ALLERGIES:  has no known allergies.  MEDICATIONS:  Current Outpatient Medications  Medication Sig Dispense Refill   amLODipine  (NORVASC ) 5 MG tablet TAKE 1 TABLET(5 MG) BY MOUTH DAILY 90 tablet 1   atorvastatin  (LIPITOR ) 20 MG tablet TAKE 1 TABLET(20 MG) BY MOUTH DAILY 90 tablet 2   omeprazole  (PRILOSEC) 20 MG capsule Take 1 capsule (20 mg total) by mouth daily. 90 capsule 2   valsartan  (DIOVAN ) 40 MG tablet Take 1 tablet (40 mg total) by mouth daily. 30 tablet 0   No current facility-administered medications for this visit.    REVIEW OF SYSTEMS:   Constitutional: ( - ) fevers, ( - )  chills , ( - ) night sweats Eyes: ( - ) blurriness of vision, ( - ) double vision, ( - ) watery eyes Ears, nose, mouth, throat, and face: ( - ) mucositis, ( - ) sore throat Respiratory: ( - ) cough, ( - ) dyspnea, ( - ) wheezes Cardiovascular: ( - ) palpitation, ( - ) chest discomfort, ( - ) lower extremity swelling Gastrointestinal:  ( - ) nausea, ( - ) heartburn, ( - ) change in bowel habits Skin: ( - ) abnormal skin rashes Lymphatics: ( - ) new lymphadenopathy, ( - ) easy bruising Neurological: ( - ) numbness, ( - ) tingling, ( - ) new weaknesses Behavioral/Psych: ( - ) mood change, ( - ) new changes  All other systems were reviewed with the patient and are  negative.  PHYSICAL EXAMINATION: Vitals:   10/31/23 0842  BP: 95/68  Pulse: (!) 102  Resp: 16  Temp: 97.8 F (36.6 C)  SpO2: 97%   Filed Weights   10/31/23 0842  Weight: 148 lb 6.4 oz (67.3 kg)    GENERAL: Well-appearing elderly Asian male, alert, no distress and comfortable SKIN: skin color, texture, turgor are normal, no rashes or significant lesions EYES: conjunctiva are pink and non-injected, sclera clear NECK: supple, non-tender LYMPH:  no palpable lymphadenopathy in the cervical, axillary or inguinal LUNGS: clear to auscultation and percussion with  normal breathing effort HEART: regular rate & rhythm and no murmurs and no lower extremity edema Musculoskeletal: no cyanosis of digits and no clubbing  PSYCH: alert & oriented x 3, fluent speech NEURO: no focal motor/sensory deficits  LABORATORY DATA:  I have reviewed the data as listed    Latest Ref Rng & Units 10/31/2023    8:03 AM 07/18/2023   11:58 AM 10/26/2021   11:26 AM  CBC  WBC 4.0 - 10.5 K/uL 14.0  16.4  9.8   Hemoglobin 13.0 - 17.0 g/dL 85.6  84.2  85.1   Hematocrit 39.0 - 52.0 % 41.8  48.9  43.4   Platelets 150 - 400 K/uL 348  407  311        Latest Ref Rng & Units 10/31/2023    8:03 AM 07/18/2023   11:58 AM 12/11/2022    3:59 PM  CMP  Glucose 70 - 99 mg/dL 882  877  92   BUN 8 - 23 mg/dL 19  17  24    Creatinine 0.61 - 1.24 mg/dL 8.11  8.27  8.34   Sodium 135 - 145 mmol/L 139  140  139   Potassium 3.5 - 5.1 mmol/L 3.9  4.6  4.5   Chloride 98 - 111 mmol/L 108  102  104   CO2 22 - 32 mmol/L 23  21  19    Calcium  8.9 - 10.3 mg/dL 9.1  9.7  9.4   Total Protein 6.5 - 8.1 g/dL 8.0  7.8  7.8   Total Bilirubin 0.0 - 1.2 mg/dL 0.7  0.6  0.4   Alkaline Phos 38 - 126 U/L 113  146  103   AST 15 - 41 U/L 13  24  18    ALT 0 - 44 U/L 9  16  14      RADIOGRAPHIC STUDIES: No results found.  ASSESSMENT & PLAN Thomas [name] 68 y.o. male with medical history significant for unexplained leukocytosis/lymphocytosis who  presents for a follow up visit.   #Leukocytosis -- Strangely the predominance of this leukocytosis has varied between neutrophilic, lymphocytic, and eosinophilic.  This would support an inflammatory cause though inflammatory markers are unremarkable.  -- Labs today show white blood cell count 14.0, MCV 14.3, MCV 76.1, platelets 348 --If no clear etiology can be found will need to consider MPN work-up, though this is considerably less likely with a lymphocytosis and no other increased CBC values. --Return to clinic in 12 months for follow up visit/labs    No orders of the defined types were placed in this encounter.   All questions were answered. The patient knows to call the clinic with any problems, questions or concerns.  A total of more than 30 minutes were spent on this encounter with face-to-face time and non-face-to-face time, including preparing to see the patient, ordering tests and/or medications, counseling the patient and coordination of care as outlined above.   Norleen IVAR Kidney, MD Department of Hematology/Oncology [facility] Phone: [phone] Pager: [phone] Email: [email or web address]  10/31/2023 9:03 AM

## 2023-11-01 LAB — SURGICAL PATHOLOGY

## 2023-11-02 LAB — FLOW CYTOMETRY

## 2023-11-06 ENCOUNTER — Other Ambulatory Visit: Payer: Self-pay | Admitting: Nurse Practitioner

## 2023-11-06 DIAGNOSIS — K219 Gastro-esophageal reflux disease without esophagitis: Secondary | ICD-10-CM

## 2023-11-09 LAB — BCR-ABL1 FISH
Cells Analyzed: 200
Cells Counted: 200

## 2023-11-13 ENCOUNTER — Other Ambulatory Visit: Payer: Self-pay | Admitting: Family Medicine

## 2023-11-13 DIAGNOSIS — R7303 Prediabetes: Secondary | ICD-10-CM

## 2023-11-13 DIAGNOSIS — I10 Essential (primary) hypertension: Secondary | ICD-10-CM

## 2023-11-13 LAB — JAK2 V617F RFX CALR/MPL/E12-15

## 2023-11-13 LAB — CALR +MPL + E12-E15  (REFLEX)

## 2023-12-20 ENCOUNTER — Ambulatory Visit: Admitting: Internal Medicine

## 2024-01-28 ENCOUNTER — Other Ambulatory Visit: Payer: Self-pay | Admitting: Nurse Practitioner

## 2024-01-28 DIAGNOSIS — I10 Essential (primary) hypertension: Secondary | ICD-10-CM

## 2024-01-30 ENCOUNTER — Other Ambulatory Visit: Payer: Self-pay | Admitting: Family Medicine

## 2024-01-30 ENCOUNTER — Ambulatory Visit: Admitting: Internal Medicine

## 2024-01-30 DIAGNOSIS — I10 Essential (primary) hypertension: Secondary | ICD-10-CM

## 2024-02-07 ENCOUNTER — Other Ambulatory Visit: Payer: Self-pay | Admitting: Family Medicine

## 2024-02-07 DIAGNOSIS — K219 Gastro-esophageal reflux disease without esophagitis: Secondary | ICD-10-CM

## 2024-02-07 DIAGNOSIS — R7303 Prediabetes: Secondary | ICD-10-CM

## 2024-02-07 DIAGNOSIS — I10 Essential (primary) hypertension: Secondary | ICD-10-CM

## 2024-04-01 ENCOUNTER — Ambulatory Visit: Admitting: Internal Medicine

## 2024-11-05 ENCOUNTER — Other Ambulatory Visit

## 2024-11-05 ENCOUNTER — Ambulatory Visit: Admitting: Hematology and Oncology
